# Patient Record
Sex: Male | Born: 1935
Health system: Southern US, Community
[De-identification: ages and names within clinical notes are randomized; demographics above are authoritative.]

## PROBLEM LIST (undated history)

## (undated) DIAGNOSIS — N400 Enlarged prostate without lower urinary tract symptoms: Secondary | ICD-10-CM

## (undated) DIAGNOSIS — E785 Hyperlipidemia, unspecified: Secondary | ICD-10-CM

## (undated) DIAGNOSIS — I10 Essential (primary) hypertension: Secondary | ICD-10-CM

## (undated) DIAGNOSIS — M1990 Unspecified osteoarthritis, unspecified site: Secondary | ICD-10-CM

## (undated) DIAGNOSIS — F0280 Dementia in other diseases classified elsewhere without behavioral disturbance: Secondary | ICD-10-CM

## (undated) DIAGNOSIS — M810 Age-related osteoporosis without current pathological fracture: Secondary | ICD-10-CM

## (undated) DIAGNOSIS — K219 Gastro-esophageal reflux disease without esophagitis: Secondary | ICD-10-CM

## (undated) HISTORY — DX: Hyperlipidemia, unspecified: E78.5

## (undated) HISTORY — DX: Gastro-esophageal reflux disease without esophagitis: K21.9

## (undated) HISTORY — DX: Essential (primary) hypertension: I10

## (undated) HISTORY — DX: Age-related osteoporosis without current pathological fracture: M81.0

---

## 2006-01-07 HISTORY — PX: HERNIA REPAIR: SHX51

## 2011-01-08 HISTORY — PX: GANGLION CYST EXCISION: SHX1691

## 2014-01-14 DIAGNOSIS — M9905 Segmental and somatic dysfunction of pelvic region: Secondary | ICD-10-CM | POA: Diagnosis not present

## 2014-01-14 DIAGNOSIS — M62830 Muscle spasm of back: Secondary | ICD-10-CM | POA: Diagnosis not present

## 2014-01-14 DIAGNOSIS — S39012A Strain of muscle, fascia and tendon of lower back, initial encounter: Secondary | ICD-10-CM | POA: Diagnosis not present

## 2014-01-14 DIAGNOSIS — M47816 Spondylosis without myelopathy or radiculopathy, lumbar region: Secondary | ICD-10-CM | POA: Diagnosis not present

## 2014-01-14 DIAGNOSIS — M5136 Other intervertebral disc degeneration, lumbar region: Secondary | ICD-10-CM | POA: Diagnosis not present

## 2014-01-14 DIAGNOSIS — M9903 Segmental and somatic dysfunction of lumbar region: Secondary | ICD-10-CM | POA: Diagnosis not present

## 2014-01-14 DIAGNOSIS — M9904 Segmental and somatic dysfunction of sacral region: Secondary | ICD-10-CM | POA: Diagnosis not present

## 2014-01-14 DIAGNOSIS — M2578 Osteophyte, vertebrae: Secondary | ICD-10-CM | POA: Diagnosis not present

## 2014-01-17 DIAGNOSIS — M2578 Osteophyte, vertebrae: Secondary | ICD-10-CM | POA: Diagnosis not present

## 2014-01-17 DIAGNOSIS — M9903 Segmental and somatic dysfunction of lumbar region: Secondary | ICD-10-CM | POA: Diagnosis not present

## 2014-01-17 DIAGNOSIS — M62830 Muscle spasm of back: Secondary | ICD-10-CM | POA: Diagnosis not present

## 2014-01-17 DIAGNOSIS — M9905 Segmental and somatic dysfunction of pelvic region: Secondary | ICD-10-CM | POA: Diagnosis not present

## 2014-01-17 DIAGNOSIS — M47816 Spondylosis without myelopathy or radiculopathy, lumbar region: Secondary | ICD-10-CM | POA: Diagnosis not present

## 2014-01-17 DIAGNOSIS — M5136 Other intervertebral disc degeneration, lumbar region: Secondary | ICD-10-CM | POA: Diagnosis not present

## 2014-01-17 DIAGNOSIS — M9904 Segmental and somatic dysfunction of sacral region: Secondary | ICD-10-CM | POA: Diagnosis not present

## 2014-01-17 DIAGNOSIS — S39012A Strain of muscle, fascia and tendon of lower back, initial encounter: Secondary | ICD-10-CM | POA: Diagnosis not present

## 2014-01-18 DIAGNOSIS — M9905 Segmental and somatic dysfunction of pelvic region: Secondary | ICD-10-CM | POA: Diagnosis not present

## 2014-01-18 DIAGNOSIS — S39012A Strain of muscle, fascia and tendon of lower back, initial encounter: Secondary | ICD-10-CM | POA: Diagnosis not present

## 2014-01-18 DIAGNOSIS — M5136 Other intervertebral disc degeneration, lumbar region: Secondary | ICD-10-CM | POA: Diagnosis not present

## 2014-01-18 DIAGNOSIS — M2578 Osteophyte, vertebrae: Secondary | ICD-10-CM | POA: Diagnosis not present

## 2014-01-18 DIAGNOSIS — M9903 Segmental and somatic dysfunction of lumbar region: Secondary | ICD-10-CM | POA: Diagnosis not present

## 2014-01-18 DIAGNOSIS — M62830 Muscle spasm of back: Secondary | ICD-10-CM | POA: Diagnosis not present

## 2014-01-18 DIAGNOSIS — M9904 Segmental and somatic dysfunction of sacral region: Secondary | ICD-10-CM | POA: Diagnosis not present

## 2014-01-18 DIAGNOSIS — M47816 Spondylosis without myelopathy or radiculopathy, lumbar region: Secondary | ICD-10-CM | POA: Diagnosis not present

## 2014-01-20 DIAGNOSIS — S39012A Strain of muscle, fascia and tendon of lower back, initial encounter: Secondary | ICD-10-CM | POA: Diagnosis not present

## 2014-01-20 DIAGNOSIS — M9904 Segmental and somatic dysfunction of sacral region: Secondary | ICD-10-CM | POA: Diagnosis not present

## 2014-01-20 DIAGNOSIS — M5136 Other intervertebral disc degeneration, lumbar region: Secondary | ICD-10-CM | POA: Diagnosis not present

## 2014-01-20 DIAGNOSIS — M9905 Segmental and somatic dysfunction of pelvic region: Secondary | ICD-10-CM | POA: Diagnosis not present

## 2014-01-20 DIAGNOSIS — M2578 Osteophyte, vertebrae: Secondary | ICD-10-CM | POA: Diagnosis not present

## 2014-01-20 DIAGNOSIS — M47816 Spondylosis without myelopathy or radiculopathy, lumbar region: Secondary | ICD-10-CM | POA: Diagnosis not present

## 2014-01-20 DIAGNOSIS — M9903 Segmental and somatic dysfunction of lumbar region: Secondary | ICD-10-CM | POA: Diagnosis not present

## 2014-01-20 DIAGNOSIS — M62830 Muscle spasm of back: Secondary | ICD-10-CM | POA: Diagnosis not present

## 2014-01-21 DIAGNOSIS — D1722 Benign lipomatous neoplasm of skin and subcutaneous tissue of left arm: Secondary | ICD-10-CM | POA: Diagnosis not present

## 2014-01-21 DIAGNOSIS — D179 Benign lipomatous neoplasm, unspecified: Secondary | ICD-10-CM | POA: Diagnosis not present

## 2014-01-21 DIAGNOSIS — R2232 Localized swelling, mass and lump, left upper limb: Secondary | ICD-10-CM | POA: Diagnosis not present

## 2014-01-28 DIAGNOSIS — M5136 Other intervertebral disc degeneration, lumbar region: Secondary | ICD-10-CM | POA: Diagnosis not present

## 2014-01-28 DIAGNOSIS — M47816 Spondylosis without myelopathy or radiculopathy, lumbar region: Secondary | ICD-10-CM | POA: Diagnosis not present

## 2014-01-28 DIAGNOSIS — M9905 Segmental and somatic dysfunction of pelvic region: Secondary | ICD-10-CM | POA: Diagnosis not present

## 2014-01-28 DIAGNOSIS — M9904 Segmental and somatic dysfunction of sacral region: Secondary | ICD-10-CM | POA: Diagnosis not present

## 2014-01-28 DIAGNOSIS — M2578 Osteophyte, vertebrae: Secondary | ICD-10-CM | POA: Diagnosis not present

## 2014-01-28 DIAGNOSIS — M9903 Segmental and somatic dysfunction of lumbar region: Secondary | ICD-10-CM | POA: Diagnosis not present

## 2014-01-28 DIAGNOSIS — M62830 Muscle spasm of back: Secondary | ICD-10-CM | POA: Diagnosis not present

## 2014-01-28 DIAGNOSIS — S39012A Strain of muscle, fascia and tendon of lower back, initial encounter: Secondary | ICD-10-CM | POA: Diagnosis not present

## 2014-03-15 DIAGNOSIS — M1990 Unspecified osteoarthritis, unspecified site: Secondary | ICD-10-CM | POA: Diagnosis not present

## 2014-03-15 DIAGNOSIS — I10 Essential (primary) hypertension: Secondary | ICD-10-CM | POA: Diagnosis not present

## 2014-03-15 DIAGNOSIS — E785 Hyperlipidemia, unspecified: Secondary | ICD-10-CM | POA: Diagnosis not present

## 2014-05-12 DIAGNOSIS — L821 Other seborrheic keratosis: Secondary | ICD-10-CM | POA: Diagnosis not present

## 2014-05-12 DIAGNOSIS — L218 Other seborrheic dermatitis: Secondary | ICD-10-CM | POA: Diagnosis not present

## 2014-05-12 DIAGNOSIS — L820 Inflamed seborrheic keratosis: Secondary | ICD-10-CM | POA: Diagnosis not present

## 2014-05-12 DIAGNOSIS — D1722 Benign lipomatous neoplasm of skin and subcutaneous tissue of left arm: Secondary | ICD-10-CM | POA: Diagnosis not present

## 2014-05-12 DIAGNOSIS — D2372 Other benign neoplasm of skin of left lower limb, including hip: Secondary | ICD-10-CM | POA: Diagnosis not present

## 2014-06-24 DIAGNOSIS — Z79899 Other long term (current) drug therapy: Secondary | ICD-10-CM | POA: Diagnosis not present

## 2014-06-24 DIAGNOSIS — K219 Gastro-esophageal reflux disease without esophagitis: Secondary | ICD-10-CM | POA: Diagnosis not present

## 2014-06-24 DIAGNOSIS — I10 Essential (primary) hypertension: Secondary | ICD-10-CM | POA: Diagnosis not present

## 2014-06-24 DIAGNOSIS — E559 Vitamin D deficiency, unspecified: Secondary | ICD-10-CM | POA: Diagnosis not present

## 2014-06-24 DIAGNOSIS — E785 Hyperlipidemia, unspecified: Secondary | ICD-10-CM | POA: Diagnosis not present

## 2014-06-24 DIAGNOSIS — R635 Abnormal weight gain: Secondary | ICD-10-CM | POA: Diagnosis not present

## 2014-06-24 DIAGNOSIS — M1990 Unspecified osteoarthritis, unspecified site: Secondary | ICD-10-CM | POA: Diagnosis not present

## 2014-07-13 DIAGNOSIS — I10 Essential (primary) hypertension: Secondary | ICD-10-CM | POA: Diagnosis not present

## 2014-07-13 DIAGNOSIS — R634 Abnormal weight loss: Secondary | ICD-10-CM | POA: Diagnosis not present

## 2014-07-13 DIAGNOSIS — M1990 Unspecified osteoarthritis, unspecified site: Secondary | ICD-10-CM | POA: Diagnosis not present

## 2014-07-13 DIAGNOSIS — E785 Hyperlipidemia, unspecified: Secondary | ICD-10-CM | POA: Diagnosis not present

## 2014-07-26 DIAGNOSIS — R490 Dysphonia: Secondary | ICD-10-CM | POA: Diagnosis not present

## 2014-07-26 DIAGNOSIS — J387 Other diseases of larynx: Secondary | ICD-10-CM | POA: Diagnosis not present

## 2014-08-08 DIAGNOSIS — M47816 Spondylosis without myelopathy or radiculopathy, lumbar region: Secondary | ICD-10-CM | POA: Diagnosis not present

## 2014-08-08 DIAGNOSIS — M9904 Segmental and somatic dysfunction of sacral region: Secondary | ICD-10-CM | POA: Diagnosis not present

## 2014-08-08 DIAGNOSIS — M5137 Other intervertebral disc degeneration, lumbosacral region: Secondary | ICD-10-CM | POA: Diagnosis not present

## 2014-08-08 DIAGNOSIS — M9905 Segmental and somatic dysfunction of pelvic region: Secondary | ICD-10-CM | POA: Diagnosis not present

## 2014-08-08 DIAGNOSIS — R293 Abnormal posture: Secondary | ICD-10-CM | POA: Diagnosis not present

## 2014-08-08 DIAGNOSIS — M9903 Segmental and somatic dysfunction of lumbar region: Secondary | ICD-10-CM | POA: Diagnosis not present

## 2014-08-08 DIAGNOSIS — M545 Low back pain: Secondary | ICD-10-CM | POA: Diagnosis not present

## 2014-08-09 DIAGNOSIS — M9903 Segmental and somatic dysfunction of lumbar region: Secondary | ICD-10-CM | POA: Diagnosis not present

## 2014-08-09 DIAGNOSIS — M5137 Other intervertebral disc degeneration, lumbosacral region: Secondary | ICD-10-CM | POA: Diagnosis not present

## 2014-08-09 DIAGNOSIS — M47816 Spondylosis without myelopathy or radiculopathy, lumbar region: Secondary | ICD-10-CM | POA: Diagnosis not present

## 2014-08-09 DIAGNOSIS — M9905 Segmental and somatic dysfunction of pelvic region: Secondary | ICD-10-CM | POA: Diagnosis not present

## 2014-08-09 DIAGNOSIS — M545 Low back pain: Secondary | ICD-10-CM | POA: Diagnosis not present

## 2014-08-09 DIAGNOSIS — R293 Abnormal posture: Secondary | ICD-10-CM | POA: Diagnosis not present

## 2014-08-09 DIAGNOSIS — M9904 Segmental and somatic dysfunction of sacral region: Secondary | ICD-10-CM | POA: Diagnosis not present

## 2014-08-11 DIAGNOSIS — M545 Low back pain: Secondary | ICD-10-CM | POA: Diagnosis not present

## 2014-08-11 DIAGNOSIS — M9903 Segmental and somatic dysfunction of lumbar region: Secondary | ICD-10-CM | POA: Diagnosis not present

## 2014-08-11 DIAGNOSIS — M9904 Segmental and somatic dysfunction of sacral region: Secondary | ICD-10-CM | POA: Diagnosis not present

## 2014-08-11 DIAGNOSIS — M9905 Segmental and somatic dysfunction of pelvic region: Secondary | ICD-10-CM | POA: Diagnosis not present

## 2014-08-11 DIAGNOSIS — M47816 Spondylosis without myelopathy or radiculopathy, lumbar region: Secondary | ICD-10-CM | POA: Diagnosis not present

## 2014-08-11 DIAGNOSIS — M5137 Other intervertebral disc degeneration, lumbosacral region: Secondary | ICD-10-CM | POA: Diagnosis not present

## 2014-08-11 DIAGNOSIS — R293 Abnormal posture: Secondary | ICD-10-CM | POA: Diagnosis not present

## 2014-08-15 DIAGNOSIS — R293 Abnormal posture: Secondary | ICD-10-CM | POA: Diagnosis not present

## 2014-08-15 DIAGNOSIS — M5137 Other intervertebral disc degeneration, lumbosacral region: Secondary | ICD-10-CM | POA: Diagnosis not present

## 2014-08-15 DIAGNOSIS — M9903 Segmental and somatic dysfunction of lumbar region: Secondary | ICD-10-CM | POA: Diagnosis not present

## 2014-08-15 DIAGNOSIS — M9905 Segmental and somatic dysfunction of pelvic region: Secondary | ICD-10-CM | POA: Diagnosis not present

## 2014-08-15 DIAGNOSIS — M9904 Segmental and somatic dysfunction of sacral region: Secondary | ICD-10-CM | POA: Diagnosis not present

## 2014-08-15 DIAGNOSIS — M545 Low back pain: Secondary | ICD-10-CM | POA: Diagnosis not present

## 2014-08-15 DIAGNOSIS — M47816 Spondylosis without myelopathy or radiculopathy, lumbar region: Secondary | ICD-10-CM | POA: Diagnosis not present

## 2014-08-17 DIAGNOSIS — M5137 Other intervertebral disc degeneration, lumbosacral region: Secondary | ICD-10-CM | POA: Diagnosis not present

## 2014-08-17 DIAGNOSIS — M47816 Spondylosis without myelopathy or radiculopathy, lumbar region: Secondary | ICD-10-CM | POA: Diagnosis not present

## 2014-08-17 DIAGNOSIS — M545 Low back pain: Secondary | ICD-10-CM | POA: Diagnosis not present

## 2014-08-17 DIAGNOSIS — M9903 Segmental and somatic dysfunction of lumbar region: Secondary | ICD-10-CM | POA: Diagnosis not present

## 2014-08-17 DIAGNOSIS — M9905 Segmental and somatic dysfunction of pelvic region: Secondary | ICD-10-CM | POA: Diagnosis not present

## 2014-08-17 DIAGNOSIS — R293 Abnormal posture: Secondary | ICD-10-CM | POA: Diagnosis not present

## 2014-08-17 DIAGNOSIS — M9904 Segmental and somatic dysfunction of sacral region: Secondary | ICD-10-CM | POA: Diagnosis not present

## 2014-08-19 DIAGNOSIS — M9903 Segmental and somatic dysfunction of lumbar region: Secondary | ICD-10-CM | POA: Diagnosis not present

## 2014-08-19 DIAGNOSIS — M9905 Segmental and somatic dysfunction of pelvic region: Secondary | ICD-10-CM | POA: Diagnosis not present

## 2014-08-19 DIAGNOSIS — M545 Low back pain: Secondary | ICD-10-CM | POA: Diagnosis not present

## 2014-08-19 DIAGNOSIS — M9904 Segmental and somatic dysfunction of sacral region: Secondary | ICD-10-CM | POA: Diagnosis not present

## 2014-08-19 DIAGNOSIS — M47816 Spondylosis without myelopathy or radiculopathy, lumbar region: Secondary | ICD-10-CM | POA: Diagnosis not present

## 2014-08-19 DIAGNOSIS — M5137 Other intervertebral disc degeneration, lumbosacral region: Secondary | ICD-10-CM | POA: Diagnosis not present

## 2014-08-19 DIAGNOSIS — R293 Abnormal posture: Secondary | ICD-10-CM | POA: Diagnosis not present

## 2014-08-23 DIAGNOSIS — M9905 Segmental and somatic dysfunction of pelvic region: Secondary | ICD-10-CM | POA: Diagnosis not present

## 2014-08-23 DIAGNOSIS — M5137 Other intervertebral disc degeneration, lumbosacral region: Secondary | ICD-10-CM | POA: Diagnosis not present

## 2014-08-23 DIAGNOSIS — M47816 Spondylosis without myelopathy or radiculopathy, lumbar region: Secondary | ICD-10-CM | POA: Diagnosis not present

## 2014-08-23 DIAGNOSIS — M545 Low back pain: Secondary | ICD-10-CM | POA: Diagnosis not present

## 2014-08-23 DIAGNOSIS — R293 Abnormal posture: Secondary | ICD-10-CM | POA: Diagnosis not present

## 2014-08-23 DIAGNOSIS — M9904 Segmental and somatic dysfunction of sacral region: Secondary | ICD-10-CM | POA: Diagnosis not present

## 2014-08-23 DIAGNOSIS — M9903 Segmental and somatic dysfunction of lumbar region: Secondary | ICD-10-CM | POA: Diagnosis not present

## 2014-09-06 DIAGNOSIS — J387 Other diseases of larynx: Secondary | ICD-10-CM | POA: Diagnosis not present

## 2014-09-06 DIAGNOSIS — R490 Dysphonia: Secondary | ICD-10-CM | POA: Diagnosis not present

## 2014-11-08 DIAGNOSIS — H524 Presbyopia: Secondary | ICD-10-CM | POA: Diagnosis not present

## 2014-11-08 DIAGNOSIS — H04123 Dry eye syndrome of bilateral lacrimal glands: Secondary | ICD-10-CM | POA: Diagnosis not present

## 2014-11-08 DIAGNOSIS — Z961 Presence of intraocular lens: Secondary | ICD-10-CM | POA: Diagnosis not present

## 2014-11-08 DIAGNOSIS — H35363 Drusen (degenerative) of macula, bilateral: Secondary | ICD-10-CM | POA: Diagnosis not present

## 2014-11-21 ENCOUNTER — Encounter: Payer: Self-pay | Admitting: Family Medicine

## 2014-11-21 DIAGNOSIS — I10 Essential (primary) hypertension: Secondary | ICD-10-CM | POA: Diagnosis not present

## 2014-11-21 DIAGNOSIS — M1990 Unspecified osteoarthritis, unspecified site: Secondary | ICD-10-CM | POA: Diagnosis not present

## 2014-11-21 DIAGNOSIS — M5136 Other intervertebral disc degeneration, lumbar region: Secondary | ICD-10-CM | POA: Diagnosis not present

## 2014-11-21 DIAGNOSIS — Z79899 Other long term (current) drug therapy: Secondary | ICD-10-CM | POA: Diagnosis not present

## 2014-11-21 DIAGNOSIS — E785 Hyperlipidemia, unspecified: Secondary | ICD-10-CM | POA: Diagnosis not present

## 2014-11-21 DIAGNOSIS — E559 Vitamin D deficiency, unspecified: Secondary | ICD-10-CM | POA: Diagnosis not present

## 2014-11-21 DIAGNOSIS — R635 Abnormal weight gain: Secondary | ICD-10-CM | POA: Diagnosis not present

## 2014-11-21 DIAGNOSIS — M419 Scoliosis, unspecified: Secondary | ICD-10-CM | POA: Diagnosis not present

## 2014-11-21 DIAGNOSIS — R52 Pain, unspecified: Secondary | ICD-10-CM | POA: Diagnosis not present

## 2014-11-21 DIAGNOSIS — Z23 Encounter for immunization: Secondary | ICD-10-CM | POA: Diagnosis not present

## 2014-11-21 DIAGNOSIS — K219 Gastro-esophageal reflux disease without esophagitis: Secondary | ICD-10-CM | POA: Diagnosis not present

## 2014-11-22 LAB — BASIC METABOLIC PANEL
BUN/Creatinine Ratio: 18
BUN: 16
Calcium: 9.1 mg/dL
Carbon Dioxide, Total: 25
Chloride: 103 mmol/L
Creat: 0.91
EGFR (Non-African Amer.): 80
Glucose: 99 (ref 65–99)
Potassium: 4.2 mmol/L
Sodium: 146

## 2014-11-22 LAB — LIPID PANEL
Cholesterol, Total: 183
HDL Cholesterol: 33 — AB (ref 39–?)
LDL Cholesterol: 117 mg/dL — AB (ref 0–99)
Triglycerides: 164 — AB (ref 0–149)
VLDL: 33 mg/dL

## 2014-12-15 DIAGNOSIS — M5431 Sciatica, right side: Secondary | ICD-10-CM | POA: Diagnosis not present

## 2014-12-15 DIAGNOSIS — M47896 Other spondylosis, lumbar region: Secondary | ICD-10-CM | POA: Diagnosis not present

## 2015-02-01 DIAGNOSIS — H524 Presbyopia: Secondary | ICD-10-CM | POA: Diagnosis not present

## 2015-02-01 DIAGNOSIS — H35363 Drusen (degenerative) of macula, bilateral: Secondary | ICD-10-CM | POA: Diagnosis not present

## 2015-02-01 DIAGNOSIS — H52222 Regular astigmatism, left eye: Secondary | ICD-10-CM | POA: Diagnosis not present

## 2015-02-01 DIAGNOSIS — H5203 Hypermetropia, bilateral: Secondary | ICD-10-CM | POA: Diagnosis not present

## 2015-02-15 DIAGNOSIS — M47896 Other spondylosis, lumbar region: Secondary | ICD-10-CM | POA: Diagnosis not present

## 2015-04-11 DIAGNOSIS — K219 Gastro-esophageal reflux disease without esophagitis: Secondary | ICD-10-CM | POA: Diagnosis not present

## 2015-04-11 DIAGNOSIS — R5383 Other fatigue: Secondary | ICD-10-CM | POA: Diagnosis not present

## 2015-04-11 DIAGNOSIS — Z79899 Other long term (current) drug therapy: Secondary | ICD-10-CM | POA: Diagnosis not present

## 2015-04-11 DIAGNOSIS — I10 Essential (primary) hypertension: Secondary | ICD-10-CM | POA: Diagnosis not present

## 2015-04-11 DIAGNOSIS — E785 Hyperlipidemia, unspecified: Secondary | ICD-10-CM | POA: Diagnosis not present

## 2015-04-17 ENCOUNTER — Encounter: Payer: Self-pay | Admitting: Family Medicine

## 2015-04-17 DIAGNOSIS — R635 Abnormal weight gain: Secondary | ICD-10-CM | POA: Diagnosis not present

## 2015-04-17 DIAGNOSIS — E559 Vitamin D deficiency, unspecified: Secondary | ICD-10-CM | POA: Diagnosis not present

## 2015-04-17 DIAGNOSIS — E785 Hyperlipidemia, unspecified: Secondary | ICD-10-CM | POA: Diagnosis not present

## 2015-04-17 DIAGNOSIS — I10 Essential (primary) hypertension: Secondary | ICD-10-CM | POA: Diagnosis not present

## 2015-04-17 DIAGNOSIS — Z79899 Other long term (current) drug therapy: Secondary | ICD-10-CM | POA: Diagnosis not present

## 2015-04-17 DIAGNOSIS — M1990 Unspecified osteoarthritis, unspecified site: Secondary | ICD-10-CM | POA: Diagnosis not present

## 2015-04-17 DIAGNOSIS — K219 Gastro-esophageal reflux disease without esophagitis: Secondary | ICD-10-CM | POA: Diagnosis not present

## 2015-04-18 LAB — HEPATIC FUNCTION PANEL
ALT: 8 (ref 0–44)
AST: 14 U/L (ref 0–40)
Albumin Serum: 3.9 (ref 3.5–4.8)
Alkaline Phosphatase: 62 U/L (ref 39–117)
Bilirubin, Direct: 0.23 mg/dL (ref 0.01–0.4)
Total Bilirubin: 0.9 mg/dL (ref 0.0–1.2)
Total Protein: 6.3 g/dL (ref 6.0–8.5)

## 2015-04-18 LAB — CBC WITH DIFFERENTIAL
BASO%: 1 %
EOS (ABSOLUTE VALUE): 0.1
EOS%: 2 %
Grans (Absolute): 0
HCT: 42 %
Hemoglobin: 14.1
Lymphocytes relative %: 28 % (ref 15–45)
Lymphs(Absolute): 1.5
MCH: 30.3
MCHC: 33.7
MCV: 90
Monocyte %: 8
Monocytes Absolute: 0 /uL
Neutrophils Absolute: 3 /uL
Neutrophils: 61
Platelet: 188
RBC: 4.65
RDW: 14.7
WBC: 5.4

## 2015-04-18 LAB — BASIC METABOLIC PANEL
BUN/Creatinine Ratio: 17
BUN: 18 (ref 8–27)
Calcium: 8.5 mg/dL — AB (ref 8.6–10.2)
Chloride: 102 mmol/L
EGFR (Non-African Amer.): 68
Glucose: 102 — AB (ref 65–99)
Potassium: 4 mmol/L
Sodium: 143

## 2015-04-18 LAB — URINALYSIS
Bilirubin (Urine): NEGATIVE
Blood, UA: NEGATIVE
Glucose, Ur: NEGATIVE
Ketones: NEGATIVE
Leukocyte Esterase: NEGATIVE
Nitrite, UA: NEGATIVE
Protein, Ur: NEGATIVE
Specific Gravity: 1.018
Urobilinogen, UA: NORMAL
pH: 6

## 2015-04-18 LAB — LIPID PANEL
Cholesterol, Total: 151 (ref 100–199)
HDL Cholesterol: 32 — AB (ref 39–?)
LDL Cholesterol (Calc): 96 (ref 0–99)
Triglycerides: 116 (ref 0–149)
VLDL: 23 mg/dL (ref 5–40)

## 2015-04-18 LAB — VITAMIN D, 1,25 + 25-HYDROXY: Vit D, 25-Hydroxy: 47.8 (ref 30–100)

## 2015-05-11 DIAGNOSIS — D234 Other benign neoplasm of skin of scalp and neck: Secondary | ICD-10-CM | POA: Diagnosis not present

## 2015-05-11 DIAGNOSIS — L820 Inflamed seborrheic keratosis: Secondary | ICD-10-CM | POA: Diagnosis not present

## 2015-05-11 DIAGNOSIS — M79642 Pain in left hand: Secondary | ICD-10-CM | POA: Diagnosis not present

## 2015-05-11 DIAGNOSIS — L218 Other seborrheic dermatitis: Secondary | ICD-10-CM | POA: Diagnosis not present

## 2015-05-11 DIAGNOSIS — L720 Epidermal cyst: Secondary | ICD-10-CM | POA: Diagnosis not present

## 2015-05-11 DIAGNOSIS — D2372 Other benign neoplasm of skin of left lower limb, including hip: Secondary | ICD-10-CM | POA: Diagnosis not present

## 2015-05-11 DIAGNOSIS — D1722 Benign lipomatous neoplasm of skin and subcutaneous tissue of left arm: Secondary | ICD-10-CM | POA: Diagnosis not present

## 2015-05-11 DIAGNOSIS — M720 Palmar fascial fibromatosis [Dupuytren]: Secondary | ICD-10-CM | POA: Diagnosis not present

## 2015-05-11 DIAGNOSIS — L538 Other specified erythematous conditions: Secondary | ICD-10-CM | POA: Diagnosis not present

## 2015-05-11 DIAGNOSIS — D485 Neoplasm of uncertain behavior of skin: Secondary | ICD-10-CM | POA: Diagnosis not present

## 2015-05-23 DIAGNOSIS — M720 Palmar fascial fibromatosis [Dupuytren]: Secondary | ICD-10-CM | POA: Diagnosis not present

## 2015-05-25 DIAGNOSIS — M720 Palmar fascial fibromatosis [Dupuytren]: Secondary | ICD-10-CM | POA: Diagnosis not present

## 2015-05-28 DIAGNOSIS — M720 Palmar fascial fibromatosis [Dupuytren]: Secondary | ICD-10-CM | POA: Insufficient documentation

## 2015-10-02 DIAGNOSIS — I6789 Other cerebrovascular disease: Secondary | ICD-10-CM | POA: Diagnosis not present

## 2015-10-02 DIAGNOSIS — K219 Gastro-esophageal reflux disease without esophagitis: Secondary | ICD-10-CM | POA: Diagnosis not present

## 2015-10-02 DIAGNOSIS — I10 Essential (primary) hypertension: Secondary | ICD-10-CM | POA: Diagnosis not present

## 2015-10-02 DIAGNOSIS — E785 Hyperlipidemia, unspecified: Secondary | ICD-10-CM | POA: Diagnosis not present

## 2015-11-11 DIAGNOSIS — Z23 Encounter for immunization: Secondary | ICD-10-CM | POA: Diagnosis not present

## 2016-02-26 ENCOUNTER — Telehealth: Payer: Self-pay

## 2016-02-26 DIAGNOSIS — I10 Essential (primary) hypertension: Secondary | ICD-10-CM

## 2016-02-26 MED ORDER — ENALAPRIL MALEATE 10 MG PO TABS: 10.0000 mg | ORAL_TABLET | Freq: Two times a day (BID) | ORAL | 2 refills | Status: DC

## 2016-02-26 NOTE — Telephone Encounter (Signed)
Patient scheduled to establish care within 1 week on 03/05/16, requested refill of Enalapril 10mg  BID, sent refill to [facility] in [place] as requested, with +2 refills and will discuss meds further at apt.  [name], [facility] 02/26/2016, 2:21 PM

## 2016-02-26 NOTE — Telephone Encounter (Signed)
Patient has just moved from [facility] and has a new patient appointment on Tuesday 03/05/16.  However, he is out of Enalapril 10mg  bid.  Patient would like to know if you would give him enough meds until he gets to his appointment next week.  He would like to use [facility] in [place].  Please call patient.

## 2016-03-05 ENCOUNTER — Encounter: Payer: Self-pay | Admitting: Family Medicine

## 2016-03-05 ENCOUNTER — Ambulatory Visit (INDEPENDENT_AMBULATORY_CARE_PROVIDER_SITE_OTHER): Payer: Medicare Other | Admitting: Family Medicine

## 2016-03-05 VITALS — BP 160/80 | HR 59 | Temp 97.8°F | Resp 16 | Ht 72.0 in | Wt 229.0 lb

## 2016-03-05 DIAGNOSIS — K219 Gastro-esophageal reflux disease without esophagitis: Secondary | ICD-10-CM | POA: Diagnosis not present

## 2016-03-05 DIAGNOSIS — Z7689 Persons encountering health services in other specified circumstances: Secondary | ICD-10-CM

## 2016-03-05 DIAGNOSIS — E782 Mixed hyperlipidemia: Secondary | ICD-10-CM

## 2016-03-05 DIAGNOSIS — Z23 Encounter for immunization: Secondary | ICD-10-CM

## 2016-03-05 DIAGNOSIS — R600 Localized edema: Secondary | ICD-10-CM | POA: Diagnosis not present

## 2016-03-05 DIAGNOSIS — M810 Age-related osteoporosis without current pathological fracture: Secondary | ICD-10-CM

## 2016-03-05 DIAGNOSIS — I10 Essential (primary) hypertension: Secondary | ICD-10-CM | POA: Insufficient documentation

## 2016-03-05 DIAGNOSIS — E785 Hyperlipidemia, unspecified: Secondary | ICD-10-CM | POA: Insufficient documentation

## 2016-03-05 MED ORDER — OMEPRAZOLE 20 MG PO CPDR: 20.0000 mg | DELAYED_RELEASE_CAPSULE | Freq: Every day | ORAL | 3 refills | Status: DC

## 2016-03-05 MED ORDER — ENALAPRIL MALEATE 10 MG PO TABS: 10.0000 mg | ORAL_TABLET | Freq: Every day | ORAL | 3 refills | Status: DC

## 2016-03-05 MED ORDER — FUROSEMIDE 20 MG PO TABS: 20.0000 mg | ORAL_TABLET | Freq: Every day | ORAL | 2 refills | Status: DC | PRN

## 2016-03-05 NOTE — Assessment & Plan Note (Signed)
Stable chronic GERD, controlled on PPI - No prior known PUD or esophagitis, unsure if prior EGD - No regular NSAID use, some tylenol only  Plan: 1. Refilled chronic Omeprazole 20mg  daily, counseled on risk with osteoporosis 2. Diet modifications reduce GERD 3. Follow-up as needed consider referral GI and EGD in future if worsening

## 2016-03-05 NOTE — Patient Instructions (Signed)
Thank you for coming in to clinic today.  Pleasure to meet you.  1. We will request all of your records from previous doctor and review these first  2. Today received TDap vaccine, good for 10 years  3. No blood work yet, will re-schedule this in future  4. Will order DEXA scan for osteoporosis evaluation  5. BP elevated - Refilled medication - Continue to take Enalapril 10mg  daily - You may continue Furosemide 20mg  ONLY AS NEEDED NOW for swelling, try not to take this everyday as it is not best for blood pressure and may harm kidneys - Try to stay hydrated - For swelling can use Compression with stockings, and Elevation - if significant swelling, lift leg above heart level (toes above your nose) to help reduce swelling, most helpful at night after day of being on your feet  Please schedule a follow-up appointment with Dr. Parks Ranger in 3 months for BP Follow-up  If you have any other questions or concerns, please feel free to call the clinic or send a message through [place]. You may also schedule an earlier appointment if necessary.  [name], DO [place]

## 2016-03-05 NOTE — Assessment & Plan Note (Signed)
Not adequately controlled HTN, BP today mild elevated (did not take meds today) No known complications   Plan:  1. Refilled Enalapril 10mg  daily #90 +3 2. Refilled Furosemide 20mg  - changed to daily PRN only, advised to limit using this regularly, as seems to only use for ankle swelling without other significant etiology for swelling 3. Start checking BP at home, write readings, handout given, bring to next visit 4. Request outside PCP records for labs and other info on prior HTN 5. Encouraged regular exercise 6. Follow-up 3 months - consider adding HCTZ low dose, instead of regular Lasix, check chemistry if needed, follow-up baseline Cr

## 2016-03-05 NOTE — Progress Notes (Addendum)
Subjective:    Patient ID: Randy [name], male    DOB: 02-Mar-1934, 82 y.o.   MRN: [id number]  Randy [name] is a 82 y.o. male presenting on 03/05/2016 for Establish Care (pt moved recently)  Previously established with PCP Dr Zenon Mayo ([facility], in [place]), patient moved with wife from [place] here to [place] recently to be closer to children and grandchildren.  HPI   CHRONIC HTN / Lower Extremity Edema Reports chronic problem for many years, has been controlled in past. Does not check BP at home, wife has cuff to check, unsure recent readings. Current Meds - Enalapril 10mg  (refilled recently prior to this visit), Furosemide 20mg  daily (for ankle swelling, for years) Reports good compliance, but did not take meds today. Tolerating well, w/o complaints. Lifestyle: - No regular exercise or particular diet plan - Will get worsening LE swelling if misses Furosemide - Has compression stockings uses occasionally Denies CP, dyspnea, HA, dizziness / lightheadedness  HYPERLIPIDEMIA: - Reports no concerns. Last lipid panel within past 1 year prior PCP, does not recall results but states cholesterol was improved on the fish oil - Previously on several statins in past, with intolerance myalgias, remembers taking lipitor unsure of others - Still taking CoQ10 with improvement in muscle aches and cramps - Taking Fish Oil 1200mg  4 times daily, with improvement - No regular exercise. Weight stable by report with some mild wt loss actually  GERD - Chronic problem for many years, has been stable on PPI omeprazole  - Prior history of EGD in past - Admits some intentional wt loss about 10 lbs over past few months with recent move - Denies any dark stools, rectal bleeding or blood in stool, early satiety, nausea, vomiting, abdominal pain  OSTEOPOROSIS - Chronic problem by history, unsure of last DEXA scan, no prior fractures reported - Continues to take Calcium and Vitamin  D3 2,000 iu daily  Health Maintenance: - Prior colonoscopy possibly >20 years ago, reported normal, awaiting records - Does not recall ever having PNA vaccine, will check records - Unsure of prior DEXA history, thinks he may have had one, not recently - Due for TDap, will get today - No known history of prostate cancer  Depression screen [facility] 2/9 03/05/2016  Decreased Interest 0  Down, Depressed, Hopeless 0  PHQ - 2 Score 0    Past Medical History:  Diagnosis Date  . GERD (gastroesophageal reflux disease)   . Hyperlipidemia   . Hypertension   . Osteoporosis    Past Surgical History:  Procedure Laterality Date  . GANGLION CYST EXCISION Left 2013   Left upper arm  . HERNIA REPAIR N/A 2008   MIdline, ventral acquired hernia repair   Social History   Social History  . Marital status: Married    Spouse name: N/A  . Number of children: N/A  . Years of education: College   Occupational History  . Retired Clinical biochemist    Social History Main Topics  . Smoking status: Former Smoker    Packs/day: 1.00    Years: 12.00    Types: Cigarettes    Quit date: 1968  . Smokeless tobacco: Former Systems developer  . Alcohol use No  . Drug use: No  . Sexual activity: Not on file   Other Topics Concern  . Not on file   Social History Narrative  . No narrative on file   History reviewed. No pertinent family history. No current outpatient prescriptions on file prior to  visit.   No current facility-administered medications on file prior to visit.     Review of Systems  Constitutional: Negative for activity change, appetite change, chills, diaphoresis, fatigue and fever.  HENT: Negative for congestion and hearing loss.   Eyes: Negative for visual disturbance.  Respiratory: Negative for cough, chest tightness, shortness of breath and wheezing.   Cardiovascular: Positive for leg swelling (bilateral ankles, chronic). Negative for chest pain and palpitations.  Gastrointestinal: Negative for  abdominal pain, blood in stool, constipation, diarrhea, nausea and vomiting.       Negative dark stools  Endocrine: Negative for polyuria.  Genitourinary: Negative for difficulty urinating.  Musculoskeletal: Positive for arthralgias (hand stiffness, aching). Negative for back pain, joint swelling and neck pain.  Skin: Negative for rash.  Allergic/Immunologic: Negative for environmental allergies.  Neurological: Negative for dizziness, weakness, light-headedness, numbness and headaches.  Hematological: Negative for adenopathy.  Psychiatric/Behavioral: Negative for behavioral problems, dysphoric mood and sleep disturbance.   Per HPI unless specifically indicated above     Objective:    BP (!) 160/80 (BP Location: Right Arm, Cuff Size: Normal)   Pulse (!) 59   Temp 97.8 F (36.6 C) (Oral)   Resp 16   Ht 6' (1.829 m)   Wt 229 lb (103.9 kg)   BMI 31.06 kg/m   Wt Readings from Last 3 Encounters:  03/05/16 229 lb (103.9 kg)    Physical Exam  Constitutional: He appears well-developed and well-nourished. No distress.  Well-appearing 82 yr male, comfortable, cooperative  HENT:  Head: Normocephalic and atraumatic.  Mouth/Throat: Oropharynx is clear and moist.  Eyes: Conjunctivae and EOM are normal. Pupils are equal, round, and reactive to light.  Neck: Normal range of motion. Neck supple. No thyromegaly present.  No carotid bruits  Cardiovascular: Normal rate, regular rhythm, normal heart sounds and intact distal pulses.   No murmur heard. Pulmonary/Chest: Effort normal and breath sounds normal. No respiratory distress. He has no wheezes. He has no rales.  Musculoskeletal: Normal range of motion. He exhibits edema (bilateral lower ankle limited non pitting mild edema). He exhibits no tenderness.  Lymphadenopathy:    He has no cervical adenopathy.  Neurological: He is alert.  Distal sensation to light touch intact  Skin: Skin is warm and dry. No rash noted. He is not diaphoretic. No  erythema.  Lower extremities / calves non tender, no erythema, no asymmetry  Psychiatric: He has a normal mood and affect. His behavior is normal.  Well groomed, good eye contact, normal speech and thoughts  Nursing note and vitals reviewed.  No results found for this or any previous visit.    Assessment & Plan:   Problem List Items Addressed This Visit    Osteoporosis    Prior history of osteoporosis No prior fracture No recent DEXA scan, last several years ago out of town, awaiting records No therapy  Plan: 1. Ordered DEXA scan, patient to schedule at [facility], follow-up results 2. Continue Ca/Vit D supplement daily      Relevant Medications   calcium citrate-vitamin D (CITRACAL+D) 315-200 MG-UNIT tablet   Other Relevant Orders   DG Bone Density   Hypertension    Not adequately controlled HTN, BP today mild elevated (did not take meds today) No known complications   Plan:  1. Refilled Enalapril 10mg  daily #90 +3 2. Refilled Furosemide 20mg  - changed to daily PRN only, advised to limit using this regularly, as seems to only use for ankle swelling without other significant etiology for  swelling 3. Start checking BP at home, write readings, handout given, bring to next visit 4. Request outside PCP records for labs and other info on prior HTN 5. Encouraged regular exercise 6. Follow-up 3 months - consider adding HCTZ low dose, instead of regular Lasix, check chemistry if needed, follow-up baseline Cr      Relevant Medications   furosemide (LASIX) 20 MG tablet   enalapril (VASOTEC) 10 MG tablet   Hyperlipidemia    Prior history of HLD, do not have outside records or recent labs, done reportedly within past 1 year Intolerance to statin therapy (multiple statins tried, including atorvastatin), improved on CoQ10  Plan: 1. Request records, lab results lipids and prior statins tried 2. Discussion on if needed due to ASCVD risk will consider re-attempt statin trial with low dose  likely rosuvastatin maybe even half dose 3 times weekly intermittently only for some mild benefit still 3. Follow-up 3 months, consider future fasting lipids based on last lab      Relevant Medications   furosemide (LASIX) 20 MG tablet   enalapril (VASOTEC) 10 MG tablet   GERD (gastroesophageal reflux disease)    Stable chronic GERD, controlled on PPI - No prior known PUD or esophagitis, unsure if prior EGD - No regular NSAID use, some tylenol only  Plan: 1. Refilled chronic Omeprazole 20mg  daily, counseled on risk with osteoporosis 2. Diet modifications reduce GERD 3. Follow-up as needed consider referral GI and EGD in future if worsening      Relevant Medications   omeprazole (PRILOSEC) 20 MG capsule   Bilateral lower extremity edema    Stable chronic mild bilateral lower extremity ankle edema, non pitting No prior confirmed etiology, suspected chronic venous stasis given age and chronic course without worsening, no known history of heart disease or CHF, unsure if prior ECHO. No recent labs to check Cr or kidney function / LFTs.  Plan: 1. Advised to only use Furosemide 20mg  daily PRN - refilled, likely switch from lasix daily to HCTZ in future if need improved BP control 2. Requesting outside PCP records, check labs, in future if need will check CMET, future consider ECHO if worsening swelling      Relevant Medications   furosemide (LASIX) 20 MG tablet    Other Visit Diagnoses    Encounter to establish care with new doctor    -  Primary   Need for Tdap vaccination       Relevant Orders   Tdap vaccine greater than or equal to 7yo IM (Completed)      Meds ordered this encounter  Medications  .       .       . Coenzyme Q10 (CO Q 10) 100 MG CAPS    Sig: Take by mouth.  . calcium citrate-vitamin D (CITRACAL+D) 315-200 MG-UNIT tablet    Sig: Take 1 tablet by mouth daily.  . Omega-3 Fatty Acids (FISH OIL) 1200 MG CAPS    Sig: Take by mouth.  [name] omeprazole (PRILOSEC) 20 MG  capsule    Sig: Take 1 capsule (20 mg total) by mouth daily.    Dispense:  90 capsule    Refill:  3  . furosemide (LASIX) 20 MG tablet    Sig: Take 1 tablet (20 mg total) by mouth daily as needed for edema.    Dispense:  30 tablet    Refill:  2  . enalapril (VASOTEC) 10 MG tablet    Sig: Take 1 tablet (10 mg total)  by mouth daily.    Dispense:  90 tablet    Refill:  3      Follow up plan: Return in about 3 months (around 06/02/2016) for blood pressure.  [name], [facility] 03/05/2016, 11:29 AM

## 2016-03-05 NOTE — Assessment & Plan Note (Signed)
Stable chronic mild bilateral lower extremity ankle edema, non pitting No prior confirmed etiology, suspected chronic venous stasis given age and chronic course without worsening, no known history of heart disease or CHF, unsure if prior ECHO. No recent labs to check Cr or kidney function / LFTs.  Plan: 1. Advised to only use Furosemide 20mg  daily PRN - refilled, likely switch from lasix daily to HCTZ in future if need improved BP control 2. Requesting outside PCP records, check labs, in future if need will check CMET, future consider ECHO if worsening swelling

## 2016-03-05 NOTE — Assessment & Plan Note (Signed)
Prior history of HLD, do not have outside records or recent labs, done reportedly within past 1 year Intolerance to statin therapy (multiple statins tried, including atorvastatin), improved on CoQ10  Plan: 1. Request records, lab results lipids and prior statins tried 2. Discussion on if needed due to ASCVD risk will consider re-attempt statin trial with low dose likely rosuvastatin maybe even half dose 3 times weekly intermittently only for some mild benefit still 3. Follow-up 3 months, consider future fasting lipids based on last lab

## 2016-03-05 NOTE — Assessment & Plan Note (Signed)
Prior history of osteoporosis No prior fracture No recent DEXA scan, last several years ago out of town, awaiting records No therapy  Plan: 1. Ordered DEXA scan, patient to schedule at [facility], follow-up results 2. Continue Ca/Vit D supplement daily

## 2016-03-06 ENCOUNTER — Encounter: Payer: Self-pay | Admitting: Family Medicine

## 2016-03-06 ENCOUNTER — Other Ambulatory Visit: Payer: Self-pay | Admitting: Family Medicine

## 2016-03-06 DIAGNOSIS — R7309 Other abnormal glucose: Secondary | ICD-10-CM

## 2016-03-06 DIAGNOSIS — I10 Essential (primary) hypertension: Secondary | ICD-10-CM

## 2016-03-06 DIAGNOSIS — N400 Enlarged prostate without lower urinary tract symptoms: Secondary | ICD-10-CM | POA: Insufficient documentation

## 2016-03-06 DIAGNOSIS — M47816 Spondylosis without myelopathy or radiculopathy, lumbar region: Secondary | ICD-10-CM | POA: Insufficient documentation

## 2016-03-06 DIAGNOSIS — E782 Mixed hyperlipidemia: Secondary | ICD-10-CM

## 2016-03-06 DIAGNOSIS — M159 Polyosteoarthritis, unspecified: Secondary | ICD-10-CM | POA: Insufficient documentation

## 2016-03-06 DIAGNOSIS — M810 Age-related osteoporosis without current pathological fracture: Secondary | ICD-10-CM

## 2016-05-23 ENCOUNTER — Other Ambulatory Visit: Payer: Medicare Other

## 2016-05-27 ENCOUNTER — Other Ambulatory Visit: Payer: Medicare Other

## 2016-05-27 DIAGNOSIS — I10 Essential (primary) hypertension: Secondary | ICD-10-CM

## 2016-05-27 DIAGNOSIS — R7309 Other abnormal glucose: Secondary | ICD-10-CM | POA: Diagnosis not present

## 2016-05-27 DIAGNOSIS — M810 Age-related osteoporosis without current pathological fracture: Secondary | ICD-10-CM

## 2016-05-27 DIAGNOSIS — E782 Mixed hyperlipidemia: Secondary | ICD-10-CM | POA: Diagnosis not present

## 2016-05-28 LAB — COMPLETE METABOLIC PANEL WITH GFR
ALT: 7 U/L — ABNORMAL LOW (ref 9–46)
AST: 12 U/L (ref 10–35)
Albumin: 3.9 g/dL (ref 3.6–5.1)
Alkaline Phosphatase: 60 U/L (ref 40–115)
BUN: 16 mg/dL (ref 7–25)
CO2: 27 mmol/L (ref 20–31)
Calcium: 8.8 mg/dL (ref 8.6–10.3)
Chloride: 105 mmol/L (ref 98–110)
Creat: 0.97 mg/dL (ref 0.70–1.11)
GFR, Est African American: 85 mL/min (ref >=60)
GFR, Est Non African American: 73 mL/min (ref >=60)
Glucose, Bld: 96 mg/dL (ref 65–99)
Potassium: 4.2 mmol/L (ref 3.5–5.3)
Sodium: 143 mmol/L (ref 135–146)
Total Bilirubin: 0.7 mg/dL (ref 0.2–1.2)
Total Protein: 6.3 g/dL (ref 6.1–8.1)

## 2016-05-28 LAB — LIPID PANEL
Cholesterol: 198 mg/dL (ref <200)
HDL: 32 mg/dL — ABNORMAL LOW (ref >40)
LDL Cholesterol: 139 mg/dL — ABNORMAL HIGH (ref <100)
Total CHOL/HDL Ratio: 6.2 Ratio — ABNORMAL HIGH (ref <5.0)
Triglycerides: 136 mg/dL (ref <150)
VLDL: 27 mg/dL (ref <30)

## 2016-05-28 LAB — HEMOGLOBIN A1C
Hgb A1c MFr Bld: 5.4 % (ref <5.7)
Mean Plasma Glucose: 108 mg/dL

## 2016-05-28 LAB — VITAMIN D 25 HYDROXY (VIT D DEFICIENCY, FRACTURES): Vit D, 25-Hydroxy: 51 ng/mL (ref 30–100)

## 2016-05-30 ENCOUNTER — Encounter: Payer: Self-pay | Admitting: Nurse Practitioner

## 2016-05-30 ENCOUNTER — Ambulatory Visit (INDEPENDENT_AMBULATORY_CARE_PROVIDER_SITE_OTHER): Payer: Medicare Other | Admitting: Nurse Practitioner

## 2016-05-30 VITALS — BP 137/73 | HR 63 | Ht 72.0 in | Wt 224.2 lb

## 2016-05-30 DIAGNOSIS — R310 Gross hematuria: Secondary | ICD-10-CM | POA: Diagnosis not present

## 2016-05-30 DIAGNOSIS — N390 Urinary tract infection, site not specified: Secondary | ICD-10-CM

## 2016-05-30 DIAGNOSIS — R319 Hematuria, unspecified: Secondary | ICD-10-CM

## 2016-05-30 LAB — POCT URINALYSIS DIPSTICK
Bilirubin, UA: NEGATIVE
Glucose, UA: NEGATIVE
Ketones, UA: NEGATIVE
Nitrite, UA: POSITIVE
Protein, UA: NEGATIVE
Spec Grav, UA: 1.025 (ref 1.010–1.025)
Urobilinogen, UA: 0.2 E.U./dL
pH, UA: 5 (ref 5.0–8.0)

## 2016-05-30 MED ORDER — NITROFURANTOIN MONOHYD MACRO 100 MG PO CAPS: 100.0000 mg | ORAL_CAPSULE | Freq: Two times a day (BID) | ORAL | 0 refills | Status: DC

## 2016-05-30 NOTE — Patient Instructions (Addendum)
Randy [name], Thank you for coming in to clinic today.  1. You likely have a urinary tract infection, but with blood in your urine we are going to go ahead and send you to urology for further evaluation.  - You will go to the urology office at:  [facility] [address] [place] Phone: [phone]  - We are also sending a urine culture to confirm your possible infection: Take Macrobid 100 mg twice daily (about every 12 hours) for 7 days.  Call clinic if you have any worsening of your urinary symptoms or notice more blood or clots in your urine.  If you are unable to pee in 12-24 hours, call clinic for emergent urology referral or go to the emergency room.   Please schedule a follow-up appointment with [name], AGNP to Return 5-7 days if symptoms worsen or fail to improve and for your regularly scheduled appointment.  If you have any other questions or concerns, please feel free to call the clinic or send a message through [place]. You may also schedule an earlier appointment if necessary.  [name], DNP, AGNP-BC Adult Gerontology Nurse Practitioner [facility], [facility]    Hematuria, Adult Hematuria is blood in your urine. It can be caused by a bladder infection, kidney infection, prostate infection, kidney stone, or cancer of your urinary tract. Infections can usually be treated with medicine, and a kidney stone usually will pass through your urine. If neither of these is the cause of your hematuria, further workup to find out the reason may be needed. It is very important that you tell your health care provider about any blood you see in your urine, even if the blood stops without treatment or happens without causing pain. Blood in your urine that happens and then stops and then happens again can be a symptom of a very serious condition. Also, pain is not a symptom in the initial stages of many urinary  cancers. Follow these instructions at home:  Drink lots of fluid, 3-4 quarts a day. If you have been diagnosed with an infection, cranberry juice is especially recommended, in addition to large amounts of water.  Avoid caffeine, tea, and carbonated beverages because they tend to irritate the bladder.  Avoid alcohol because it may irritate the prostate.  Take all medicines as directed by your health care provider.  If you were prescribed an antibiotic medicine, finish it all even if you start to feel better.  If you have been diagnosed with a kidney stone, follow your health care provider's instructions regarding straining your urine to catch the stone.  Empty your bladder often. Avoid holding urine for long periods of time.  After a bowel movement, women should cleanse front to back. Use each tissue only once.  Empty your bladder before and after sexual intercourse if you are a male. Contact a health care provider if:  You develop back pain.  You have a fever.  You have a feeling of sickness in your stomach (nausea) or vomiting.  Your symptoms are not better in 3 days. Return sooner if you are getting worse. Get help right away if:  You develop severe vomiting and are unable to keep the medicine down.  You develop severe back or abdominal pain despite taking your medicines.  You begin passing a large amount of blood or clots in your urine.  You feel extremely weak or faint, or you pass out. This information is not intended  to replace advice given to you by your health care provider. Make sure you discuss any questions you have with your health care provider. Document Released: 12/24/2004 Document Revised: 06/01/2015 Document Reviewed: 08/24/2012 Elsevier Interactive Patient Education  2017 [employer].

## 2016-05-30 NOTE — Progress Notes (Signed)
I have reviewed this encounter including the documentation in this note and/or discussed this patient with the provider, [name], AGPCNP-BC. I am certifying that I agree with the content of this note as supervising physician.  [name], [facility] 05/30/2016, 5:25 PM

## 2016-05-30 NOTE — Progress Notes (Signed)
Subjective:    Patient ID: Randy [name], male    DOB: Mar 05, 1934, 82 y.o.   MRN: [id number]  Randy [name] is a 82 y.o. male presenting on 05/30/2016 for Hematuria (gross hematuria, dysuria)   HPI Blood in urine today  Onset with first void this am.  Every urination since has had blood.   Bright red blood, that pt states is a little cloudy not clear.  No clots and no blood yesterday.  New urinary symptoms include only dysuria - w/ sample in clinic had slight burning.  He also endorses urgency without change in last day.  He has had urgency approx 1 year and uses pads to prevent urge incontinence.  Denies frequency, difficulty with weak stream/trouble starting or stopping stream, flank pain or new back pain, fever, chills, or sweats.  He has not been sexually active and denies any penile discharge or genital pain.   Social History  Substance Use Topics  . Smoking status: Former Smoker    Packs/day: 1.00    Years: 12.00    Types: Cigarettes    Quit date: 1968  . Smokeless tobacco: Former Systems developer  . Alcohol use No    Review of Systems Per HPI unless specifically indicated above     Objective:    BP 137/73   Pulse 63   Ht 6' (1.829 m)   Wt 224 lb 3.2 oz (101.7 kg)   BMI 30.41 kg/m    Wt Readings from Last 3 Encounters:  05/30/16 224 lb 3.2 oz (101.7 kg)  03/05/16 229 lb (103.9 kg)    Physical Exam  Constitutional: He is oriented to person, place, and time. He appears well-developed and well-nourished. No distress.  HENT:  Head: Normocephalic and atraumatic.  Cardiovascular: Normal rate, regular rhythm and normal heart sounds.   +1 pitting pedal edema with sock-line edema  Pulmonary/Chest: Effort normal and breath sounds normal. No respiratory distress.  Abdominal: Soft. Bowel sounds are normal. He exhibits no distension and no mass. There is no tenderness. There is no rebound and no guarding.  Neurological: He is alert and oriented to person, place, and time.  Skin: Skin is  warm and dry.  Psychiatric: He has a normal mood and affect. His behavior is normal. Judgment and thought content normal.  Vitals reviewed.   Results for orders placed or performed in visit on 05/30/16  POCT Urinalysis Dipstick  Result Value Ref Range   Color, UA Red    Clarity, UA cloudy    Glucose, UA N    Bilirubin, UA N    Ketones, UA N    Spec Grav, UA 1.025 1.010 - 1.025   Blood, UA mod    pH, UA 5.0 5.0 - 8.0   Protein, UA neg    Urobilinogen, UA 0.2 0.2 or 1.0 E.U./dL   Nitrite, UA pos    Leukocytes, UA Large (3+) (A) Negative      Assessment & Plan:   Problem List Items Addressed This Visit    None    Visit Diagnoses    Hematuria, gross    -  Primary Pt with acute onset gross hematuria today with only positive symptom of minimal burning on urination.  No blood noticed yesterday.  Probable UTI with positive dipstick UA (leukocytes and nitrites).  Plan: 1. POCT UA dipstick today. 2. Send urine for culture and sensitivity to confirm infection and antibiotic sensitivity. 3. Urology referral for further evaluation of bleeding source.   Relevant Orders  POCT Urinalysis Dipstick (Completed)   Ambulatory referral to Urology   Urine Culture   Urinary tract infection with hematuria, site unspecified    Probable UTI noted with POCT urine dipstick.   Plan: 1. Macrobid 100 mg bid for 7 days. 2. Await urine culture to confirm antibiotic efficacy.  Call pt to change or stop if not effective or UTI not confirmed, respectively.   Relevant Medications   nitrofurantoin, macrocrystal-monohydrate, (MACROBID) 100 MG capsule      Meds ordered this encounter  Medications  . nitrofurantoin, macrocrystal-monohydrate, (MACROBID) 100 MG capsule    Sig: Take 1 capsule (100 mg total) by mouth 2 (two) times daily.    Dispense:  14 capsule    Refill:  0    Order Specific Question:   Supervising Provider    Answer:   [name] [[id number]]      Follow up plan: Return  5-7 days if symptoms worsen or fail to improve and for your regularly scheduled appointment.   [name], DNP, AGPCNP-BC Adult Gerontology Primary Care Nurse Practitioner [facility] 05/30/2016, 1:24 PM

## 2016-06-05 ENCOUNTER — Encounter: Payer: Self-pay | Admitting: Family Medicine

## 2016-06-05 ENCOUNTER — Ambulatory Visit (INDEPENDENT_AMBULATORY_CARE_PROVIDER_SITE_OTHER): Payer: Medicare Other | Admitting: Family Medicine

## 2016-06-05 VITALS — BP 124/80 | HR 57 | Temp 98.2°F | Resp 16 | Ht 72.0 in | Wt 227.0 lb

## 2016-06-05 DIAGNOSIS — E782 Mixed hyperlipidemia: Secondary | ICD-10-CM

## 2016-06-05 DIAGNOSIS — R600 Localized edema: Secondary | ICD-10-CM

## 2016-06-05 DIAGNOSIS — I10 Essential (primary) hypertension: Secondary | ICD-10-CM

## 2016-06-05 NOTE — Progress Notes (Signed)
Subjective:    Patient ID: Randy [name], male    DOB: 03-Oct-1934, 82 y.o.   MRN: [id number]  Randy [name] is a 82 y.o. male presenting on 06/05/2016 for Hypertension  HPI   FOLLOW-UP GROSS HEMATURIA - Last seen by [name], AGPCNP-BC on 05/30/16, states his symptoms have now resolved within 24 hours of taking the Macrobid antibiotic, has since finished the antibiotic - Denies any further dysuria, hematuria (gross), nausea, vomiting, abdominal or pelvic pain  CHRONIC HTN / Lower Extremity Edema - Last visit with me 03/05/16 for initial visit to establish care, his BP meds were not changed last visit, given refills. - Today he has mild elevated BP on initial check. He has been checking BP at home regularly with ranging SBP 120-140/60-70 - Improved elevation of legs to reduce swelling, and elevated bed as well. Has compression stockings uses occasionally Current Meds - Enalapril 10mg  - He still has Furosemide 20mg  daily, only took 1 pill in past 3 months for lower leg swelling, he admits to polyuria if taking this med, and has not needed Reports good compliance, took BP med today. Tolerating well, w/o complaints. Lifestyle: - No regular exercise or particular diet plan Denies CP, dyspnea, HA, dizziness / lightheadedness  PMH HLD - Has not started taking ASA 81mg  daily for ASCVD risk reduction, he forgot this but will start this soon. Previously discussed intolerant of other statins and Zetia in past.  Health Maintenance: - Has not scheduled DEXA since last visit  Depression screen [facility] 2/9 03/05/2016  Decreased Interest 0  Down, Depressed, Hopeless 0  PHQ - 2 Score 0    Past Medical History:  Diagnosis Date  . GERD (gastroesophageal reflux disease)   . Hyperlipidemia   . Hypertension   . Osteoporosis    Past Surgical History:  Procedure Laterality Date  . GANGLION CYST EXCISION Left 2013   Left upper arm  . HERNIA REPAIR N/A 2008   MIdline, ventral acquired hernia repair    Social History   Social History  . Marital status: Married    Spouse name: N/A  . Number of children: N/A  . Years of education: College   Occupational History  . Retired Clinical biochemist    Social History Main Topics  . Smoking status: Former Smoker    Packs/day: 1.00    Years: 12.00    Types: Cigarettes    Quit date: 1968  . Smokeless tobacco: Former Systems developer  . Alcohol use No  . Drug use: No  . Sexual activity: Not on file   Other Topics Concern  . Not on file   Social History Narrative  . No narrative on file   Family History  Problem Relation Age of Onset  . Breast cancer Mother   . Lung cancer Father   . Heart disease Brother   . Leukemia Brother   . Prostate cancer Neg Hx   . Colon cancer Neg Hx    Current Outpatient Prescriptions on File Prior to Visit  Medication Sig  . calcium citrate-vitamin D (CITRACAL+D) 315-200 MG-UNIT tablet Take 1 tablet by mouth daily.  . Coenzyme Q10 (CO Q 10) 100 MG CAPS Take by mouth.  . enalapril (VASOTEC) 10 MG tablet Take 1 tablet (10 mg total) by mouth daily.  . Omega-3 Fatty Acids (FISH OIL) 1200 MG CAPS Take by mouth.  [name] omeprazole (PRILOSEC) 20 MG capsule Take 1 capsule (20 mg total) by mouth daily.  . furosemide (LASIX) 20 MG tablet Take  1 tablet (20 mg total) by mouth daily as needed for edema. (Patient not taking: Reported on 06/05/2016)   No current facility-administered medications on file prior to visit.     Review of Systems   Per HPI unless specifically indicated above     Objective:    BP 124/80 (BP Location: Right Arm, Cuff Size: Normal)   Pulse (!) 57   Temp 98.2 F (36.8 C) (Oral)   Resp 16   Ht 6' (1.829 m)   Wt 227 lb (103 kg)   BMI 30.79 kg/m   Wt Readings from Last 3 Encounters:  06/05/16 227 lb (103 kg)  05/30/16 224 lb 3.2 oz (101.7 kg)  03/05/16 229 lb (103.9 kg)    Physical Exam  Constitutional: He is oriented to person, place, and time. He appears well-developed and well-nourished. No  distress.  Well-appearing 82 yr male, comfortable, cooperative  HENT:  Head: Normocephalic and atraumatic.  Mouth/Throat: Oropharynx is clear and moist.  Eyes: Conjunctivae are normal.  Neck: Normal range of motion. Neck supple.  Cardiovascular: Normal rate, regular rhythm, normal heart sounds and intact distal pulses.   No murmur heard. Pulmonary/Chest: Effort normal and breath sounds normal. No respiratory distress. He has no wheezes. He has no rales.  Musculoskeletal: He exhibits edema (Mild improvement bilateral lower ankle limited trace to +1 non pitting edema, no asymmetry).  Neurological: He is alert and oriented to person, place, and time.  Skin: Skin is warm and dry. No rash noted. He is not diaphoretic. No erythema.  Psychiatric: He has a normal mood and affect. His behavior is normal.  Well groomed, good eye contact, normal speech and thoughts  Nursing note and vitals reviewed.  Results for orders placed or performed in visit on 05/30/16  POCT Urinalysis Dipstick  Result Value Ref Range   Color, UA Red    Clarity, UA cloudy    Glucose, UA N    Bilirubin, UA N    Ketones, UA N    Spec Grav, UA 1.025 1.010 - 1.025   Blood, UA mod    pH, UA 5.0 5.0 - 8.0   Protein, UA neg    Urobilinogen, UA 0.2 0.2 or 1.0 E.U./dL   Nitrite, UA pos    Leukocytes, UA Large (3+) (A) Negative      Assessment & Plan:   Problem List Items Addressed This Visit    Hypertension - Primary    Initial BP electronic reading elevated, manual repeat normal range. Home readings normal. No known complications  Plan: 1. Reassurance that his BP is controlled off Furosemide 20mg  daily now on PRN only x 1 dose in 3 months 2. Continue Enalapril 10mg  daily - no change today 3. Continue conservative therapy for chronic venous stasis / LE edema, RICE 4. Continue monitor BP at home, follow-up sooner if abnormal 5. Encourage start regular exercise 6. Follow-up 3 months HTN - future consider low dose  thiazide for swelling or adjust ACEi      Hyperlipidemia    ASCVD risk is elevated >30% Re-discussed risk reduction, had forgotten to start ASA 81mg  - will start this now, future consider very low dose or intermittent dosing Rosuvastatin, likely start with x 1 weekly and gradually increase vs consider Repatha      Bilateral lower extremity edema    Stable chronic mild bilateral lower extremity ankle edema, non pitting - Suspected chronic venous stasis without complication - Reassurance now with no worsening off of Lasix 20mg  daily  Plan: 1. Continue to only use Furosemide 20mg  daily PRN - x 1 use in 3 months is very appropriate, demonstrated not needing regularly 2. Follow-up         No orders of the defined types were placed in this encounter.   Follow up plan: Return in about 3 months (around 09/05/2016) for blood pressure.  [name], [facility] 06/06/2016, 6:46 AM

## 2016-06-05 NOTE — Patient Instructions (Addendum)
Thank you for coming in to clinic today.  1. Blood pressure is better today - Keep checking BP at home several times a week - Continue Enalapril 10mg  daily - I am pleased that you have not needed Furosemide, only take as needed as you are - For swelling can use Compression with stockings, and Elevation - if significant swelling, lift leg above heart level (toes above your nose) to help reduce swelling, most helpful at night after day of being on your feet  If BP at home is >140/90 consistently then notify office and return sooner for re-check BP or Nurse Visit  Start taking baby Aspirin 81mg  once daily to reduce risk of heart attack or stroke  2. DEXA scan for osteoporosis evaluation  [facility] [place], [place] [address] Phone: [phone] Call anytime to schedule DEXA Scan for Osteoporosis Testing  Please schedule a follow-up appointment with Dr. Parks Ranger 3 months for HTN, HLD  If you have any other questions or concerns, please feel free to call the clinic or send a message through [place]. You may also schedule an earlier appointment if necessary.  [name], DO [place]

## 2016-06-06 NOTE — Assessment & Plan Note (Signed)
Stable chronic mild bilateral lower extremity ankle edema, non pitting - Suspected chronic venous stasis without complication - Reassurance now with no worsening off of Lasix 20mg  daily  Plan: 1. Continue to only use Furosemide 20mg  daily PRN - x 1 use in 3 months is very appropriate, demonstrated not needing regularly 2. Follow-up

## 2016-06-06 NOTE — Assessment & Plan Note (Signed)
Initial BP electronic reading elevated, manual repeat normal range. Home readings normal. No known complications  Plan: 1. Reassurance that his BP is controlled off Furosemide 20mg  daily now on PRN only x 1 dose in 3 months 2. Continue Enalapril 10mg  daily - no change today 3. Continue conservative therapy for chronic venous stasis / LE edema, RICE 4. Continue monitor BP at home, follow-up sooner if abnormal 5. Encourage start regular exercise 6. Follow-up 3 months HTN - future consider low dose thiazide for swelling or adjust ACEi

## 2016-06-06 NOTE — Assessment & Plan Note (Signed)
ASCVD risk is elevated >30% Re-discussed risk reduction, had forgotten to start ASA 81mg  - will start this now, future consider very low dose or intermittent dosing Rosuvastatin, likely start with x 1 weekly and gradually increase vs consider Repatha

## 2016-07-03 ENCOUNTER — Encounter: Payer: Self-pay | Admitting: Urology

## 2016-07-03 ENCOUNTER — Ambulatory Visit (INDEPENDENT_AMBULATORY_CARE_PROVIDER_SITE_OTHER): Payer: Medicare Other | Admitting: Urology

## 2016-07-03 VITALS — BP 161/90 | HR 66 | Ht 72.0 in | Wt 225.9 lb

## 2016-07-03 DIAGNOSIS — R310 Gross hematuria: Secondary | ICD-10-CM | POA: Diagnosis not present

## 2016-07-03 NOTE — Progress Notes (Signed)
07/03/2016 3:53 PM   [name] 04/15/1934 [id number]  Referring provider: Mikey College, NP [place], [place] [address]  Chief Complaint  Patient presents with  . Hematuria    HPI: The patient is an 82 year old gentleman who presents today for evaluation of gross hematuria. Patient reports that it was a painless experience. He denied dysuria or suprapubic or flank pain during this encounter. It spontaneously resolved before take any medication for. He has never had this problem before. He has no history of nephrolithiasis or UTI. He states he voids well without any complaints at this time.  He does have a 12 pack year history of smoking. He quit in 1968.    PMH: Past Medical History:  Diagnosis Date  . GERD (gastroesophageal reflux disease)   . Hyperlipidemia   . Hypertension   . Osteoporosis     Surgical History: Past Surgical History:  Procedure Laterality Date  . GANGLION CYST EXCISION Left 2013   Left upper arm  . HERNIA REPAIR N/A 2008   MIdline, ventral acquired hernia repair    Home Medications:  Allergies as of 07/03/2016      Reactions   Antihistamines, Chlorpheniramine-type Other (See Comments)   Can't urinate      Medication List       Accurate as of 07/03/16  3:53 PM. Always use your most recent med list.          calcium citrate-vitamin D 315-200 MG-UNIT tablet Commonly known as:  CITRACAL+D Take 1 tablet by mouth daily.   Co Q 10 100 MG Caps Take by mouth.   enalapril 10 MG tablet Commonly known as:  VASOTEC Take 1 tablet (10 mg total) by mouth daily.   Fish Oil 1200 MG Caps Take by mouth.   furosemide 20 MG tablet Commonly known as:  LASIX Take 1 tablet (20 mg total) by mouth daily as needed for edema.   omeprazole 20 MG capsule Commonly known as:  PRILOSEC Take 1 capsule (20 mg total) by mouth daily.       Allergies:  Allergies  Allergen Reactions  . Antihistamines, Chlorpheniramine-Type Other (See  Comments)    Can't urinate    Family History: Family History  Problem Relation Age of Onset  . Breast cancer Mother   . Lung cancer Father   . Heart disease Brother   . Leukemia Brother   . Prostate cancer Neg Hx   . Colon cancer Neg Hx   . Bladder Cancer Neg Hx   . Kidney cancer Neg Hx     Social History:  reports that he quit smoking about 50 years ago. His smoking use included Cigarettes. He has a 12.00 pack-year smoking history. He has quit using smokeless tobacco. He reports that he does not drink alcohol or use drugs.  ROS: UROLOGY Frequent Urination?: Yes Hard to postpone urination?: Yes Burning/pain with urination?: No Get up at night to urinate?: Yes Leakage of urine?: Yes Urine stream starts and stops?: No Trouble starting stream?: No Do you have to strain to urinate?: Yes Blood in urine?: No Urinary tract infection?: No Sexually transmitted disease?: No Injury to kidneys or bladder?: No Painful intercourse?: No Weak stream?: Yes Erection problems?: Yes Penile pain?: No  Gastrointestinal Nausea?: No Vomiting?: No Indigestion/heartburn?: No Diarrhea?: No Constipation?: No  Constitutional Fever: No Night sweats?: No Weight loss?: No Fatigue?: No  Skin Skin rash/lesions?: No Itching?: No  Eyes Blurred vision?: No Double vision?: No  Ears/Nose/Throat Sore  throat?: No Sinus problems?: No  Hematologic/Lymphatic Swollen glands?: No Easy bruising?: No  Cardiovascular Leg swelling?: Yes Chest pain?: No  Respiratory Cough?: No Shortness of breath?: No  Endocrine Excessive thirst?: No  Musculoskeletal Back pain?: Yes Joint pain?: Yes  Neurological Headaches?: No Dizziness?: No  Psychologic Depression?: No Anxiety?: No  Physical Exam: BP (!) 161/90 (BP Location: Left Arm, Patient Position: Sitting, Cuff Size: Normal)   Pulse 66   Ht 6' (1.829 m)   Wt 225 lb 14.4 oz (102.5 kg)   BMI 30.64 kg/m   Constitutional:  Alert and  oriented, No acute distress. HEENT: [place] AT, moist mucus membranes.  Trachea midline, no masses. Cardiovascular: No clubbing, cyanosis, or edema. Respiratory: Normal respiratory effort, no increased work of breathing. GI: Abdomen is soft, nontender, nondistended, no abdominal masses GU: No CVA tenderness.  Skin: No rashes, bruises or suspicious lesions. Lymph: No cervical or inguinal adenopathy. Neurologic: Grossly intact, no focal deficits, moving all 4 extremities. Psychiatric: Normal mood and affect.  Laboratory Data: Lab Results  Component Value Date   WBC 5.4 04/17/2015   HCT 42 04/17/2015   MCV 90 04/17/2015   PLT 188 04/17/2015    Lab Results  Component Value Date   CREATININE 0.97 05/27/2016    No results found for: PSA  No results found for: TESTOSTERONE  Lab Results  Component Value Date   HGBA1C 5.4 05/27/2016    Urinalysis    Component Value Date/Time   COLORURINE yellow 04/17/2015 0826   PHURINE 6.0 04/17/2015 0826   GLUCOSEU negative 04/17/2015 0826   BILIRUBINUR N 05/30/2016 1325   BILIRUBINUR negative 04/17/2015 0826   KETONESUR negative 04/17/2015 0826   PROTEINUR neg 05/30/2016 1325   PROTEINUR negative 04/17/2015 0826   UROBILINOGEN 0.2 05/30/2016 1325   UROBILINOGEN Normal 04/17/2015 0826   NITRITE pos 05/30/2016 1325   NITRITE Negative 04/17/2015 0826   LEUKOCYTESUR Large (3+) (A) 05/30/2016 1325   LEUKOCYTESUR negative 04/17/2015 0826    Assessment & Plan:   1. Gross hematuria We'll range the patient undergo a CT urogram followed by office cystoscopy for complete hematuria workup.  Return for after CT for cysto.  Nickie Retort, MD  [facility] [address], [place], [place] [address] [phone]

## 2016-07-04 LAB — URINALYSIS, COMPLETE
Bilirubin, UA: NEGATIVE
Glucose, UA: NEGATIVE
Ketones, UA: NEGATIVE
Leukocytes, UA: NEGATIVE
Nitrite, UA: NEGATIVE
Protein, UA: NEGATIVE
RBC, UA: NEGATIVE
Specific Gravity, UA: 1.015 (ref 1.005–1.030)
Urobilinogen, Ur: 0.2 mg/dL (ref 0.2–1.0)
pH, UA: 6.5 (ref 5.0–7.5)

## 2016-07-23 ENCOUNTER — Ambulatory Visit (INDEPENDENT_AMBULATORY_CARE_PROVIDER_SITE_OTHER): Payer: Medicare Other | Admitting: Nurse Practitioner

## 2016-07-23 ENCOUNTER — Encounter: Payer: Self-pay | Admitting: Nurse Practitioner

## 2016-07-23 VITALS — BP 138/84 | HR 63 | Temp 98.0°F | Resp 16 | Ht 72.0 in | Wt 228.0 lb

## 2016-07-23 DIAGNOSIS — I82401 Acute embolism and thrombosis of unspecified deep veins of right lower extremity: Secondary | ICD-10-CM | POA: Diagnosis not present

## 2016-07-23 MED ORDER — APIXABAN 5 MG PO TABS: ORAL_TABLET | ORAL | 0 refills | Status: DC

## 2016-07-23 NOTE — Progress Notes (Signed)
Subjective:    Patient ID: Randy [name], male    DOB: Dec 04, 1934, 82 y.o.   MRN: [id number]  Randy [name] is a 82 y.o. male presenting on 07/23/2016 for Joint Swelling (right side onset last night painful)   HPI R lower leg swelling - First noticed swelling last night,  Woke up in pain.  He notes current dull pain/ache 9/10 on numeric pain scale.  He has a more sharp pain w/ walking or moving his foot w/ plantar flexion.  - Swelling of Right leg from ankle to thigh. - Localized erythema - Pt notes he flew yesterday (1:30pm - 7pm) and didn't have time to walk. Has never had prior DVT.  - Not on anticoagulant, but is taking 81mg  aspirin.   Social History  Substance Use Topics  . Smoking status: Former Smoker    Packs/day: 1.00    Years: 12.00    Types: Cigarettes    Quit date: 1968  . Smokeless tobacco: Former Systems developer  . Alcohol use No    Review of Systems Per HPI unless specifically indicated above     Objective:    BP 138/84   Pulse 63   Temp 98 F (36.7 C) (Oral)   Resp 16   Ht 6' (1.829 m)   Wt 228 lb (103.4 kg)   BMI 30.92 kg/m   Wt Readings from Last 3 Encounters:  07/23/16 228 lb (103.4 kg)  07/03/16 225 lb 14.4 oz (102.5 kg)  06/05/16 227 lb (103 kg)    Physical Exam  Constitutional: He is oriented to person, place, and time. He appears well-developed and well-nourished. No distress.  HENT:  Head: Normocephalic and atraumatic.  Eyes: EOM are normal.  Cardiovascular: Normal rate, regular rhythm, normal heart sounds and intact distal pulses.   Pulmonary/Chest: Effort normal and breath sounds normal. No respiratory distress.  Neurological: He is alert and oriented to person, place, and time. No cranial nerve deficit.  Decreased sensation of bilateral lower extremities  Skin: Skin is warm, dry and intact.     Leg circumference 7 inches below tibial tuberosity: Left: 16 1/4 inch Right: 17 inches  Psychiatric: He has a normal mood and affect. His behavior is  normal. Judgment and thought content normal.  Vitals reviewed.    Results for orders placed or performed in visit on 07/03/16  Urinalysis, Complete  Result Value Ref Range   Specific Gravity, UA 1.015 1.005 - 1.030   pH, UA 6.5 5.0 - 7.5   Color, UA Yellow Yellow   Appearance Ur Clear Clear   Leukocytes, UA Negative Negative   Protein, UA Negative Negative/Trace   Glucose, UA Negative Negative   Ketones, UA Negative Negative   RBC, UA Negative Negative   Bilirubin, UA Negative Negative   Urobilinogen, Ur 0.2 0.2 - 1.0 mg/dL   Nitrite, UA Negative Negative      Assessment & Plan:   Problem List Items Addressed This Visit    None    Visit Diagnoses    Leg DVT (deep venous thromboembolism), acute, right ([place])    -  Primary Pt w/ presumed DVT until further evaluation.  Wells DVT scoring = 4.  Pt w/ recent sedentary flight travel.  Pain evaluation complicated by peripheral neuropathy and changed pain sensation.    Plan: 1. Initiate Eliquis two tablets twice daily x 7 days then two tablets one daily for 3 weeks. Re-evaluate and consider continuing therapy for longer.  Limit to shortest course possible r/t pt  risk for falls. 2. Discussed precautions of being on anticoagulation.   3. Obtain bilat [place] evaluate DVT ASAP.   4. Instructed pt to rest, keep leg elevated.  Reviewed signs of PE, MI, CVA.  Seek care in ED via EMS if any of these symptoms occur.   Relevant Medications   apixaban (ELIQUIS STARTER PACK) 5 MG TABS tablet   Other Relevant Orders   VAS [place] LOWER EXTREMITY VENOUS (DVT)      Meds ordered this encounter  Medications  . apixaban (ELIQUIS STARTER PACK) 5 MG TABS tablet    Sig: Take 2 tablets twice daily for 7 days, then two tablets once daily.    Dispense:  60 tablet    Refill:  0    Order Specific Question:   Supervising Provider    Answer:   [name] [[id number]]      Follow up plan: Return in about 3 days (around 07/26/2016) for DVT  followup.   [name], DNP, AGPCNP-BC Adult Gerontology Primary Care Nurse Practitioner [facility] 07/24/2016, 10:11 PM

## 2016-07-23 NOTE — Progress Notes (Deleted)
Flew yesterday (1:30 arrived in [place]). Didn't have time to walk. Has not had DVT ever.   Not on anticoagulant (81 aspirin).   First noticed swelling last night,  Woke up in pain.  Dull pain/ache 0-10 9/10 on pain scale.  More sharp pain w/ movement   Swelling - Left leg 16 1/4 - 17 in Right leg

## 2016-07-23 NOTE — Patient Instructions (Addendum)
Randy [name], Thank you for coming in to clinic today.  1. You likely have a DVT You need an ultrasound of your legs.  This will be done at [facility] [address], [place] [address]  Main: [phone]   They will call your for your appointment.  2. Take eliquis 5 mg tablet - two tablets twice daily ( about every 12 hours) for 7 days, then two tablets once daily for about 3 weeks.  We may continue it for longer. This will dissolve a clot in your leg. Use only your electric razor while taking eliquis.  3. If you have chest pain, signs of a stroke, or trouble breathing, call 9-1-1 Stroke warning signs: Face -drooping Arms/leg - weakness Speech -slurring or can't get the words out Time -act fast and call 9-1-1    Please schedule a follow-up appointment with [name], AGNP to Return in about 3 days (around 07/26/2016) for DVT followup.  If you have any other questions or concerns, please feel free to call the clinic or send a message through [place]. You may also schedule an earlier appointment if necessary.  [name], DNP, AGNP-BC Adult Gerontology Nurse Practitioner [facility]   Deep Vein Thrombosis A deep vein thrombosis (DVT) is a blood clot (thrombus) that usually occurs in a deep, larger vein of the lower leg or the pelvis, or in an upper extremity such as the arm. These are dangerous and can lead to serious and even life-threatening complications if the clot travels to the lungs. A DVT can damage the valves in your leg veins so that instead of flowing upward, the blood pools in the lower leg. This is called post-thrombotic syndrome, and it can result in pain, swelling, discoloration, and sores on the leg. What are the causes? A DVT is caused by the formation of a blood clot in your leg, pelvis, or arm. Usually, several things contribute to the formation of blood clots. A clot may develop  when:  Your blood flow slows down.  Your vein becomes damaged in some way.  You have a condition that makes your blood clot more easily.  What increases the risk? A DVT is more likely to develop in:  People who are older, especially over 64 years of age.  People who are overweight (obese).  People who sit or lie still for a long time, such as during long-distance travel (over 4 hours), bed rest, hospitalization, or during recovery from certain medical conditions like a stroke.  People who do not engage in much physical activity (sedentary lifestyle).  People who have chronic breathing disorders.  People who have a personal or family history of blood clots or blood clotting disease.  People who have peripheral vascular disease (PVD), diabetes, or some types of cancer.  People who have heart disease, especially if the person had a recent heart attack or has congestive heart failure.  People who have neurological diseases that affect the legs (leg paresis).  People who have had a traumatic injury, such as breaking a hip or leg.  People who have recently had major or lengthy surgery, especially on the hip, knee, or abdomen.  People who have had a central line placed inside a large vein.  People who take medicines that contain the hormone estrogen. These include birth control pills and hormone replacement therapy.  Pregnancy or during childbirth or the postpartum period.  Long plane flights (over 8 hours).  What are the signs or symptoms?  Symptoms of a DVT can include:  Swelling of your leg or arm, especially if one side is much worse.  Warmth and redness of your leg or arm, especially if one side is much worse.  Pain in your arm or leg. If the clot is in your leg, symptoms may be more noticeable or worse when you stand or walk.  A feeling of pins and needles, if the clot is in the arm.  The symptoms of a DVT that has traveled to the lungs (pulmonary embolism, PE)  usually start suddenly and include:  Shortness of breath while active or at rest.  Coughing or coughing up blood or blood-tinged mucus.  Chest pain that is often worse with deep breaths.  Rapid or irregular heartbeat.  Feeling light-headed or dizzy.  Fainting.  Feeling anxious.  Sweating.  There may also be pain and swelling in a leg if that is where the blood clot started. These symptoms may represent a serious problem that is an emergency. Do not wait to see if the symptoms will go away. Get medical help right away. Call your local emergency services (911 in the [place] Do not drive yourself to the hospital. How is this diagnosed? Your health care provider will take a medical history and perform a physical exam. You may also have other tests, including:  Blood tests to assess the clotting properties of your blood.  Imaging tests, such as CT, ultrasound, MRI, X-ray, and other tests to see if you have clots anywhere in your body.  How is this treated? After a DVT is identified, it can be treated. The type of treatment that you receive depends on many factors, such as the cause of your DVT, your risk for bleeding or developing more clots, and other medical conditions that you have. Sometimes, a combination of treatments is necessary. Treatment options may be combined and include:  Monitoring the blood clot with ultrasound.  Taking medicines by mouth, such as newer blood thinners (anticoagulants), thrombolytics, or warfarin.  Taking anticoagulant medicine by injection or through an IV tube.  Wearing compression stockings or using different types ofdevices.  Surgery (rare) to remove the blood clot or to place a filter in your abdomen to stop the blood clot from traveling to your lungs.  Treatments for a DVT are often divided into immediate treatment and long-term treatment (up to 3 months after DVT). You can work with your health care provider to choose the treatment program that  is best for you. Follow these instructions at home: If you are taking a newer oral anticoagulant:  Take the medicine every single day at the same time each day.  Understand what foods and drugs interact with this medicine.  Understand that there are no regular blood tests required when using this medicine.  Understand the side effects of this medicine, including excessive bruising or bleeding. Ask your health care provider or pharmacist about other possible side effects. If you are taking warfarin:  Understand how to take warfarin and know which foods can affect how warfarin works in Veterinary surgeon.  Understand that it is dangerous to take too much or too little warfarin. Too much warfarin increases the risk of bleeding. Too little warfarin continues to allow the risk for blood clots.  Follow your PT and INR blood testing schedule. The PT and INR results allow your health care provider to adjust your dose of warfarin. It is very important that you have your  PT and INR tested as often as told by your health care provider.  Avoid major changes in your diet, or tell your health care provider before you change your diet. Arrange a visit with a registered dietitian to answer your questions. Many foods, especially foods that are high in vitamin K, can interfere with warfarin and affect the PT and INR results. Eat a consistent amount of foods that are high in vitamin K, such as: ? Spinach, kale, broccoli, cabbage, collard greens, turnip greens, Brussels sprouts, peas, cauliflower, seaweed, and parsley. ? Beef liver and pork liver. ? Green tea. ? Soybean oil.  Tell your health care provider about any and all medicines, vitamins, and supplements that you take, including aspirin and other over-the-counter anti-inflammatory medicines. Be especially cautious with aspirin and anti-inflammatory medicines. Do not take those before you ask your health care provider if it is safe to do so. This is important  because many medicines can interfere with warfarin and affect the PT and INR results.  Do not start or stop taking any over-the-counter or prescription medicine unless your health care provider or pharmacist tells you to do so. If you take warfarin, you will also need to do these things:  Hold pressure over cuts for longer than usual.  Tell your dentist and other health care providers that you are taking warfarin before you have any procedures in which bleeding may occur.  Avoid alcohol or drink very small amounts. Tell your health care provider if you change your alcohol intake.  Do not use tobacco products, including cigarettes, chewing tobacco, and e-cigarettes. If you need help quitting, ask your health care provider.  Avoid contact sports.  General instructions  Take over-the-counter and prescription medicines only as told by your health care provider. Anticoagulant medicines can have side effects, including easy bruising and difficulty stopping bleeding. If you are prescribed an anticoagulant, you will also need to do these things: ? Hold pressure over cuts for longer than usual. ? Tell your dentist and other health care providers that you are taking anticoagulants before you have any procedures in which bleeding may occur. ? Avoid contact sports.  Wear a medical alert bracelet or carry a medical alert card that says you have had a PE.  Ask your health care provider how soon you can go back to your normal activities. Stay active to prevent new blood clots from forming.  Make sure to exercise while traveling or when you have been sitting or standing for a long period of time. It is very important to exercise. Exercise your legs by walking or by tightening and relaxing your leg muscles often. Take frequent walks.  Wear compression stockings as told by your health care provider to help prevent more blood clots from forming.  Do not use tobacco products, including cigarettes, chewing  tobacco, and e-cigarettes. If you need help quitting, ask your health care provider.  Keep all follow-up appointments with your health care provider. This is important. How is this prevented? Take these actions to decrease your risk of developing another DVT:  Exercise regularly. For at least 30 minutes every day, engage in: ? Activity that involves moving your arms and legs. ? Activity that encourages good blood flow through your body by increasing your heart rate.  Exercise your arms and legs every hour during long-distance travel (over 4 hours). Drink plenty of water and avoid drinking alcohol while traveling.  Avoid sitting or lying in bed for long periods of time without moving  your legs.  Maintain a weight that is appropriate for your height. Ask your health care provider what weight is healthy for you.  If you are a woman who is over 52 years of age, avoid unnecessary use of medicines that contain estrogen. These include birth control pills.  Do not smoke, especially if you take estrogen medicines. If you need help quitting, ask your health care provider.  If you are hospitalized, prevention measures may include:  Early walking after surgery, as soon as your health care provider says that it is safe.  Receiving anticoagulants to prevent blood clots.If you cannot take anticoagulants, other options may be available, such as wearing compression stockings or using different types of devices.  Get help right away if:  You have new or increased pain, swelling, or redness in an arm or leg.  You have numbness or tingling in an arm or leg.  You have shortness of breath while active or at rest.  You have chest pain.  You have a rapid or irregular heartbeat.  You feel light-headed or dizzy.  You cough up blood.  You notice blood in your vomit, bowel movement, or urine. These symptoms may represent a serious problem that is an emergency. Do not wait to see if the symptoms will  go away. Get medical help right away. Call your local emergency services (911 in the [place] Do not drive yourself to the hospital. This information is not intended to replace advice given to you by your health care provider. Make sure you discuss any questions you have with your health care provider. Document Released: 12/24/2004 Document Revised: 06/01/2015 Document Reviewed: 04/20/2014 Elsevier Interactive Patient Education  2017 [employer].

## 2016-07-25 ENCOUNTER — Encounter: Payer: Self-pay | Admitting: Emergency Medicine

## 2016-07-25 ENCOUNTER — Emergency Department
Admission: EM | Admit: 2016-07-25 | Discharge: 2016-07-25 | Disposition: A | Discharge status: Home / Self Care | Payer: Medicare Other | Attending: Emergency Medicine | Admitting: Emergency Medicine

## 2016-07-25 ENCOUNTER — Other Ambulatory Visit: Payer: 59

## 2016-07-25 ENCOUNTER — Telehealth: Payer: Self-pay | Admitting: Nurse Practitioner

## 2016-07-25 ENCOUNTER — Emergency Department: Payer: Medicare Other

## 2016-07-25 DIAGNOSIS — Z7901 Long term (current) use of anticoagulants: Secondary | ICD-10-CM | POA: Insufficient documentation

## 2016-07-25 DIAGNOSIS — L03115 Cellulitis of right lower limb: Secondary | ICD-10-CM | POA: Insufficient documentation

## 2016-07-25 DIAGNOSIS — R609 Edema, unspecified: Secondary | ICD-10-CM | POA: Diagnosis not present

## 2016-07-25 DIAGNOSIS — M7989 Other specified soft tissue disorders: Secondary | ICD-10-CM | POA: Diagnosis not present

## 2016-07-25 DIAGNOSIS — Z87891 Personal history of nicotine dependence: Secondary | ICD-10-CM | POA: Diagnosis not present

## 2016-07-25 DIAGNOSIS — I10 Essential (primary) hypertension: Secondary | ICD-10-CM | POA: Diagnosis not present

## 2016-07-25 DIAGNOSIS — Z79899 Other long term (current) drug therapy: Secondary | ICD-10-CM | POA: Insufficient documentation

## 2016-07-25 DIAGNOSIS — R2241 Localized swelling, mass and lump, right lower limb: Secondary | ICD-10-CM | POA: Diagnosis present

## 2016-07-25 LAB — CBC
HCT: 38.4 % — ABNORMAL LOW (ref 40.0–52.0)
Hemoglobin: 13.2 g/dL (ref 13.0–18.0)
MCH: 30.1 pg (ref 26.0–34.0)
MCHC: 34.3 g/dL (ref 32.0–36.0)
MCV: 87.7 fL (ref 80.0–100.0)
Platelets: 210 10*3/uL (ref 150–440)
RBC: 4.38 MIL/uL — ABNORMAL LOW (ref 4.40–5.90)
RDW: 13.8 % (ref 11.5–14.5)
WBC: 6.2 10*3/uL (ref 3.8–10.6)

## 2016-07-25 LAB — BASIC METABOLIC PANEL
Anion gap: 9 (ref 5–15)
BUN: 20 mg/dL (ref 6–20)
CO2: 28 mmol/L (ref 22–32)
Calcium: 8.7 mg/dL — ABNORMAL LOW (ref 8.9–10.3)
Chloride: 103 mmol/L (ref 101–111)
Creatinine, Ser: 0.91 mg/dL (ref 0.61–1.24)
GFR calc Af Amer: >60 mL/min (ref >60)
GFR calc non Af Amer: >60 mL/min (ref >60)
Glucose, Bld: 154 mg/dL — ABNORMAL HIGH (ref 65–99)
Potassium: 3.6 mmol/L (ref 3.5–5.1)
Sodium: 140 mmol/L (ref 135–145)

## 2016-07-25 MED ORDER — CEPHALEXIN 500 MG PO CAPS: 500.0000 mg | ORAL_CAPSULE | Freq: Two times a day (BID) | ORAL | 0 refills | Status: DC

## 2016-07-25 MED ORDER — FUROSEMIDE 20 MG PO TABS
20.0000 mg | ORAL_TABLET | Freq: Every day | ORAL | 0 refills | Status: DC
Start: 2016-07-25 — End: 2016-07-31

## 2016-07-25 MED ORDER — CEPHALEXIN 500 MG PO CAPS
500.0000 mg | ORAL_CAPSULE | Freq: Once | ORAL | Status: AC
  Administered 2016-07-25: 500 mg via ORAL
  Filled 2016-07-25: qty 1

## 2016-07-25 MED ORDER — POTASSIUM CHLORIDE ER 10 MEQ PO TBCR: 10.0000 meq | EXTENDED_RELEASE_TABLET | Freq: Every day | ORAL | 0 refills | Status: DC

## 2016-07-25 NOTE — Telephone Encounter (Signed)
Pt was in earlier this week with blood clot in leg and he said it was aching.  Please advise pt [phone]

## 2016-07-25 NOTE — Progress Notes (Signed)
I have reviewed this encounter including the documentation in this note and/or discussed this patient with the provider, [name], AGPCNP-BC. I am certifying that I agree with the content of this note as supervising physician.  [name], [facility] 07/25/2016, 8:11 AM

## 2016-07-25 NOTE — Telephone Encounter (Signed)
The pt called earlier complaining of worsening pain in the Rt ankle x 1 day. The pain worsen with movement, no SOB. He states swelling is about the same as it was on Tuesday.

## 2016-07-25 NOTE — ED Triage Notes (Addendum)
Pt to ED via EMS from home with c/o RT lower extrem edema. Pt has noted swelling x4days , states recurrent issues, currently on Elequis x3days.  Pt A&OX4, VS stable. + pulses. Pt was seen at [facility] this week and told to follow up for worsening symptoms. Pt recently traveled by flight

## 2016-07-25 NOTE — ED Provider Notes (Signed)
[facility] Emergency Department Provider Note ____________________________________________   I have reviewed the triage vital signs and the triage nursing note.  HISTORY  Chief Complaint Leg Swelling   Historian Patient  HPI Randy [name] is a 82 y.o. male who is here for evaluation of right leg pain and swelling. Sounds like it started about 4 days ago. He was seen on Tuesday at primary care doctor's office and seen by a nurse practitioner who was concerned about DVT, but no imaging was done. He was initiated on Eloquis management, and it sounds like was scheduled for an ultrasound on Monday morning. Patient has had continued pain and swelling in that leg and came in for evaluation. X-ray pain is moderate. No history of trauma. Some bruising and skin redness near his calf and ankle without skin rash.  Recent long car ride.  No chest pain or shortness of breath.    Past Medical History:  Diagnosis Date  . GERD (gastroesophageal reflux disease)   . Hyperlipidemia   . Hypertension   . Osteoporosis     Patient Active Problem List   Diagnosis Date Noted  . BPH without obstruction/lower urinary tract symptoms 03/06/2016  . Osteoarthritis of multiple joints 03/06/2016  . Degenerative joint disease (DJD) of lumbar spine 03/06/2016  . Hypertension 03/05/2016  . Hyperlipidemia 03/05/2016  . Osteoporosis 03/05/2016  . GERD (gastroesophageal reflux disease) 03/05/2016  . Bilateral lower extremity edema 03/05/2016    Past Surgical History:  Procedure Laterality Date  . GANGLION CYST EXCISION Left 2013   Left upper arm  . HERNIA REPAIR N/A 2008   MIdline, ventral acquired hernia repair    Prior to Admission medications   Medication Sig Start Date End Date Taking? Authorizing Provider  apixaban (ELIQUIS STARTER PACK) 5 MG TABS tablet Take 2 tablets twice daily for 7 days, then two tablets once daily. 07/23/16   Mikey College, NP  calcium  citrate-vitamin D (CITRACAL+D) 315-200 MG-UNIT tablet Take 1 tablet by mouth daily.    [provider]  cephALEXin (KEFLEX) 500 MG capsule Take 1 capsule (500 mg total) by mouth 2 (two) times daily. 07/25/16   Lisa Roca, MD  Coenzyme Q10 (CO Q 10) 100 MG CAPS Take by mouth.    [provider]  enalapril (VASOTEC) 10 MG tablet Take 1 tablet (10 mg total) by mouth daily. 03/05/16   Karamalegos, Devonne Doughty, DO  furosemide (LASIX) 20 MG tablet Take 1 tablet (20 mg total) by mouth daily as needed for edema. 03/05/16   Karamalegos, Devonne Doughty, DO  furosemide (LASIX) 20 MG tablet Take 1 tablet (20 mg total) by mouth daily. 07/25/16   Lisa Roca, MD  Omega-3 Fatty Acids (FISH OIL) 1200 MG CAPS Take by mouth.    [provider]  omeprazole (PRILOSEC) 20 MG capsule Take 1 capsule (20 mg total) by mouth daily. 03/05/16   Karamalegos, Devonne Doughty, DO  potassium chloride (K-DUR) 10 MEQ tablet Take 1 tablet (10 mEq total) by mouth daily. 07/25/16   Lisa Roca, MD    Allergies  Allergen Reactions  . Antihistamines, Chlorpheniramine-Type Other (See Comments)    Can't urinate    Family History  Problem Relation Age of Onset  . Breast cancer Mother   . Lung cancer Father   . Heart disease Brother   . Leukemia Brother   . Prostate cancer Neg Hx   . Colon cancer Neg Hx   . Bladder Cancer Neg Hx   . Kidney  cancer Neg Hx     Social History Social History  Substance Use Topics  . Smoking status: Former Smoker    Packs/day: 1.00    Years: 12.00    Types: Cigarettes    Quit date: 1968  . Smokeless tobacco: Former Systems developer  . Alcohol use No    Review of Systems  Constitutional: Negative for fever. Eyes: Negative for visual changes. ENT: Negative for sore throat. Cardiovascular: Negative for chest pain. Respiratory: Negative for shortness of breath. Gastrointestinal: Negative for abdominal pain, vomiting and diarrhea. Genitourinary: Negative for  dysuria. Musculoskeletal: Negative for back pain. Skin: Negative for rash. Neurological: Negative for headache.  ____________________________________________   PHYSICAL EXAM:  VITAL SIGNS: ED Triage Vitals  Enc Vitals Group     BP 07/25/16 1603 (!) 148/83     Pulse Rate 07/25/16 1603 79     Resp 07/25/16 1603 16     Temp 07/25/16 1603 98.3 F (36.8 C)     Temp Source 07/25/16 1603 Oral     SpO2 07/25/16 1603 95 %     Weight 07/25/16 1604 220 lb (99.8 kg)     Height 07/25/16 1604 6' (1.829 m)     Head Circumference --      Peak Flow --      Pain Score 07/25/16 1602 0     Pain Loc --      Pain Edu? --      Excl. in [place]? --      Constitutional: Alert and oriented. Well appearing and in no distress. HEENT   Head: Normocephalic and atraumatic.      Eyes: Conjunctivae are normal. Pupils equal and round.       Ears:         Nose: No congestion/rhinnorhea.   Mouth/Throat: Mucous membranes are moist.   Neck: No stridor. Cardiovascular/Chest: Normal rate, regular rhythm.  No murmurs, rubs, or gallops. Respiratory: Normal respiratory effort without tachypnea nor retractions. Breath sounds are clear and equal bilaterally. No wheezes/rales/rhonchi. Gastrointestinal: Soft. No distention, no guarding, no rebound. Nontender.    Genitourinary/rectal:Deferred Musculoskeletal: Nontender with normal range of motion in all extremities. Trace lower extremity edema left lower extremity. Right leg is significantly swollen, 3+ lower extremity pitting edema and calf tenderness. Some mild bruising appearance to the inner right ankle area without ankle effusion. Neurologic:  Normal speech and language. No gross or focal neurologic deficits are appreciated. Skin:  Skin is warm, dry and intact. No rash noted. Psychiatric: Mood and affect are normal. Speech and behavior are normal. Patient exhibits appropriate insight and judgment.   ____________________________________________  LABS  (pertinent positives/negatives)  Labs Reviewed  CBC - Abnormal; Notable for the following:       Result Value   RBC 4.38 (*)    HCT 38.4 (*)    All other components within normal limits  BASIC METABOLIC PANEL - Abnormal; Notable for the following:    Glucose, Bld 154 (*)    Calcium 8.7 (*)    All other components within normal limits    ____________________________________________    EKG I, Lisa Roca, MD, the attending physician have personally viewed and interpreted all ECGs.  None ____________________________________________  RADIOLOGY All Xrays were viewed by me. Imaging interpreted by Radiologist.  Venous Doppler ultrasound:  IMPRESSION: No evidence of DVT within either lower extremity. __________________________________________  PROCEDURES  Procedure(s) performed: None  Critical Care performed: None  ____________________________________________   ED COURSE / ASSESSMENT AND PLAN  Pertinent labs & imaging  results that were available during my care of the patient were reviewed by me and considered in my medical decision making (see chart for details).   If the patient was placed on Eloquis out of presumptive diagnosis of DVT, without imaging diagnosis of DVT for complaint of right leg swelling.  I am going to go ahead and do the diagnostic test.  Ultrasound negative for DVT in either leg. I've asked patient to stop the Eloquis.  Unclear initiating event with the significant right-sided swelling with respect to the left side, he does have some bruising that would almost indicate possible ankle sprain, but patient does not report a history of such. There is also some erythema and warmth and I discussed with him whether or not to hold off on antibiotics for go ahead and try, which is to proceed. Patient was started on a course of Keflex. In terms of the actual swelling, patient stated that he thought he was on Lasix, but he did not bring his pills with him, and  so then he thought maybe he is not on it. His medical chart history stated 20 mg Lasix. In any case if he is not on Lasix he should do 20 mg Lasix for 3 days with potassium supplementation, if he is already on 20 mg, he will be taking 40 mg Lasix daily for 3 days. He will need recheck early next week.   We discussed he does need close follow-up, potentially evaluation with cardiologist although he is not showing any signs of CHF here.    CONSULTATIONS: None Patient / Family / Caregiver informed of clinical course, medical decision-making process, and agree with plan.   I discussed return precautions, follow-up instructions, and discharge instructions with patient and/or family.  Discharge Instructions : You leg was evaluated and no DVT was found.  Please increase her Lasix dose to 40 mg daily for 3 days. We discussed trying a course of antibiotics as well for possible early cellulitis.  Return to the emergency room immediately for any worsening condition including no worsening leg pain, weakness, numbness, skin rash, fever, or any other symptoms concerning to you.  ___________________________________________   FINAL CLINICAL IMPRESSION(S) / ED DIAGNOSES   Final diagnoses:  Peripheral edema  Cellulitis of right lower extremity              Note: This dictation was prepared with Dragon dictation. Any transcriptional errors that result from this process are unintentional    Lisa Roca, MD 07/25/16 2044

## 2016-07-25 NOTE — Discharge Instructions (Signed)
You leg was evaluated and no DVT was found.  Please increase her Lasix dose to 40 mg daily for 3 days. We discussed trying a course of antibiotics as well for possible early cellulitis.  Return to the emergency room immediately for any worsening condition including no worsening leg pain, weakness, numbness, skin rash, fever, or any other symptoms concerning to you.

## 2016-07-25 NOTE — Telephone Encounter (Signed)
Pt was requested to follow up w/ me on Friday anyway.  Please schedule an appointment for reassessment.  Thanks

## 2016-07-26 ENCOUNTER — Encounter: Payer: Self-pay | Admitting: Nurse Practitioner

## 2016-07-26 ENCOUNTER — Ambulatory Visit (INDEPENDENT_AMBULATORY_CARE_PROVIDER_SITE_OTHER): Payer: Medicare Other | Admitting: Nurse Practitioner

## 2016-07-26 VITALS — BP 116/63 | HR 68 | Temp 98.0°F | Ht 72.0 in | Wt 225.0 lb

## 2016-07-26 DIAGNOSIS — L03115 Cellulitis of right lower limb: Secondary | ICD-10-CM | POA: Diagnosis not present

## 2016-07-26 NOTE — Progress Notes (Signed)
Subjective:    Patient ID: Randy [name], male    DOB: October 21, 1934, 82 y.o.   MRN: [id number]  Muhanad Torosyan is a 82 y.o. male presenting on 07/26/2016 for Cellulitis (right lower leg)   HPI  Cellulitis RLE Pt notes persistently worsening redness, swelling, and pain.  Pt decided to seek care in ER for increased, constant pain on 07/26/26.  At ER, pt did have BLE ultrasound doppler study performed to rule out DVT. There is no DVT present.   Pt received prescriptions for cephalexin 500 mg bidx7 days, lasix 20 mg x 3 days w/ potassium 10 meq x 3 days.  He has not yet gotten these prescriptions filled.  He notes current level of pain 8/10 and is described as constant aching/soreness.  Social History  Substance Use Topics  . Smoking status: Former Smoker    Packs/day: 1.00    Years: 12.00    Types: Cigarettes    Quit date: 1968  . Smokeless tobacco: Former Systems developer  . Alcohol use No    Review of Systems Per HPI unless specifically indicated above     Objective:    BP 116/63 (BP Location: Right Arm, Patient Position: Sitting, Cuff Size: Large)   Pulse 68   Temp 98 F (36.7 C) (Oral)   Ht 6' (1.829 m)   Wt 225 lb (102.1 kg)   BMI 30.52 kg/m   Wt Readings from Last 3 Encounters:  07/26/16 225 lb (102.1 kg)  07/25/16 220 lb (99.8 kg)  07/23/16 228 lb (103.4 kg)    Physical Exam  Constitutional: He is oriented to person, place, and time. He appears well-developed and well-nourished. No distress.  HENT:  Head: Normocephalic and atraumatic.  Musculoskeletal:  Pt ambulates w/ cane.  Neurological: He is alert and oriented to person, place, and time.  Skin: Skin is warm and dry. There is erythema.     Psychiatric: He has a normal mood and affect. His behavior is normal.         Results for orders placed or performed during the hospital encounter of 07/25/16  CBC  Result Value Ref Range   WBC 6.2 3.8 - 10.6 K/uL   RBC 4.38 (L) 4.40 - 5.90 MIL/uL   Hemoglobin 13.2 13.0 - 18.0  g/dL   HCT 38.4 (L) 40.0 - 52.0 %   MCV 87.7 80.0 - 100.0 fL   MCH 30.1 26.0 - 34.0 pg   MCHC 34.3 32.0 - 36.0 g/dL   RDW 13.8 11.5 - 14.5 %   Platelets 210 150 - 440 K/uL  Basic metabolic panel  Result Value Ref Range   Sodium 140 135 - 145 mmol/L   Potassium 3.6 3.5 - 5.1 mmol/L   Chloride 103 101 - 111 mmol/L   CO2 28 22 - 32 mmol/L   Glucose, Bld 154 (H) 65 - 99 mg/dL   BUN 20 6 - 20 mg/dL   Creatinine, Ser 0.91 0.61 - 1.24 mg/dL   Calcium 8.7 (L) 8.9 - 10.3 mg/dL   GFR calc non Af Amer >60 >60 mL/min   GFR calc Af Amer >60 >60 mL/min   Anion gap 9 5 - 15      Assessment & Plan:   Problem List Items Addressed This Visit    None    Visit Diagnoses    Cellulitis of right lower extremity    -  Primary Pt w/ worsening of cellulitis after office visit on 07/23/16 when presenting symptoms were c/w  DVT and pt was diagnosed and empirically treated for DVT.  Pt asked at that visit to follow up in 3 days, but no appointment was made.  Pt presented to ED 2 days later and was diagnosed w/ cellulitis.  Pt has not yet initiated treatment w/ antibiotics.  Current symptoms c/w cellulitis and non-draining wound.  Plan: 1. Continue with treatment as directed by ED.   2. Reviewed followup criteria for clinic (localized worsening on antibiotics) vs ED (systemic symptoms). 3. Follow up if no resolution in 5-7 days.      No orders of the defined types were placed in this encounter.     Follow up plan: Return 5-7 days if symptoms worsen or fail to improve.  [name], DNP, AGPCNP-BC Adult Gerontology Primary Care Nurse Practitioner [facility] 07/27/2016, 11:01 PM

## 2016-07-26 NOTE — Patient Instructions (Addendum)
Randy [name], Thank you for coming in to clinic today.  1. For your leg, Continue the medications you were prescribed at the ER. - furosemide - is a diuretic and will make you pee but will help with swelling.  It also gets rid of potassium, so make sure you take the prescribed potassium supplement.  -  Cephalexin is your antibiotic and will help heal your skin infection.  YOU DO NOT have a clot.  STOP taking the Eliquis.  - Follow up at [facility] if: - your skin starts draining - 3 days after your antibiotic your leg is still red - Call if no improvement in 5-7 days and we will send a different antibiotic regimen.  If you have a fever, chills, sweats, feel bad - You will go to the ER again.  Please schedule a follow-up appointment with [name], AGNP to Return 5-7 days if symptoms worsen or fail to improve.  If you have any other questions or concerns, please feel free to call the clinic or send a message through [place]. You may also schedule an earlier appointment if necessary.  [name], DNP, AGNP-BC Adult Gerontology Nurse Practitioner [place]

## 2016-07-29 ENCOUNTER — Ambulatory Visit: Admission: RE | Admit: 2016-07-29 | Payer: 59 | Source: Ambulatory Visit

## 2016-07-29 ENCOUNTER — Telehealth: Payer: Self-pay | Admitting: Family Medicine

## 2016-07-29 NOTE — Progress Notes (Signed)
I have reviewed this encounter including the documentation in this note and/or discussed this patient with the provider, [name], AGPCNP-BC. I am certifying that I agree with the content of this note as supervising physician.  [name], [facility] 07/29/2016, 12:19 PM

## 2016-07-29 NOTE — Telephone Encounter (Signed)
Does pt have a CT scheduled and where?  Please call [phone]

## 2016-07-29 NOTE — Telephone Encounter (Signed)
Pt advised.

## 2016-07-31 ENCOUNTER — Encounter: Payer: Self-pay | Admitting: Family Medicine

## 2016-07-31 ENCOUNTER — Ambulatory Visit (INDEPENDENT_AMBULATORY_CARE_PROVIDER_SITE_OTHER): Payer: Medicare Other | Admitting: Family Medicine

## 2016-07-31 VITALS — BP 122/72 | HR 64 | Temp 97.7°F | Resp 16 | Ht 72.0 in | Wt 225.6 lb

## 2016-07-31 DIAGNOSIS — L03115 Cellulitis of right lower limb: Secondary | ICD-10-CM

## 2016-07-31 DIAGNOSIS — R600 Localized edema: Secondary | ICD-10-CM

## 2016-07-31 DIAGNOSIS — I872 Venous insufficiency (chronic) (peripheral): Secondary | ICD-10-CM

## 2016-07-31 MED ORDER — DOXYCYCLINE HYCLATE 100 MG PO TABS: 100.0000 mg | ORAL_TABLET | Freq: Two times a day (BID) | ORAL | 0 refills | Status: DC

## 2016-07-31 MED ORDER — CEFTRIAXONE SODIUM 1 G IJ SOLR
1.0000 g | Freq: Once | INTRAMUSCULAR | Status: AC
  Administered 2016-07-31: 1 g via INTRAMUSCULAR

## 2016-07-31 MED ORDER — FUROSEMIDE 20 MG PO TABS: 20.0000 mg | ORAL_TABLET | Freq: Every day | ORAL | 2 refills | Status: DC | PRN

## 2016-07-31 NOTE — Progress Notes (Signed)
Subjective:    Patient ID: Randy [name], male    DOB: 1934-02-09, 82 y.o.   MRN: [id number]  Randy [name] is a 82 y.o. male presenting on 07/31/2016 for Ankle Pain (Right side swollen  pain redness onset week)   HPI   Right Lower Extremity Swelling / Stasis Dermatitis vs Cellulitis: - Patient has a known chronic course of b/l LE edema venous stasis, had been managed on lasix PRN for symptoms - Review of recent course for same problem initially seen at [facility] on 7/17 by [name], AGPCNP-BC for RLE acute swelling within 24 hour onset following long travel / sedentary and considered to be high risk for acute DVT Well's score was calculated at that time of 4. He was treated empirically with Eliquis and then ordered LE Doppler US, however his symptoms worsened within 48 hours, and had not had US performed yet, he presented to [facility] ED on 7/19, worsening symptoms, bilateral US doppler performed and NEGATIVE for DVT, it was considered that the redness and pain may be due to cellulitis by ED and he was started on antibiotics with Keflex 500mg  BID x 7 days, also for swelling his Lasix was doubled to 40mg  daily for 3 days. He followed up with [name], AGPCNP-BC back here at [facility] following day on 07/26/16, no significant change within first 24 hours, he was continued on current treatment plan, and advised to return within 1 week if not improved. - Today he has returned for re-eval. He describes RLE pain and swelling seem worse. He is unsure if redness is improved or not. He has 1 more day of Keflex. He finished Lasix double dose for 3 days, and is unsure if swelling was improved during those 3 days, now he is taking Lasix 20mg  daily. He is voiding well. - Admits had fevers/chills/sweats, seems last episode last night, none today, he is taking Tylenol PRN for fever, took some today - Tried elevation only minimal not above heart level. Has not tried wrapping or compression - He has never seen Vascular  Surgery or any specialist for his edema - Admits had mild episode of inc bruising after taking Eliquis temporarily, has stopped this now - Denies any chest pain, dyspnea, hemoptysis, palpitations, other extremity or leg swelling or pain, drainage of pus or ulceration  Social History  Substance Use Topics  . Smoking status: Former Smoker    Packs/day: 1.00    Years: 12.00    Types: Cigarettes    Quit date: 1968  . Smokeless tobacco: Former Systems developer  . Alcohol use No    Review of Systems Per HPI unless specifically indicated above     Objective:    BP 122/72   Pulse 64   Temp 97.7 F (36.5 C) (Oral)   Resp 16   Ht 6' (1.829 m)   Wt 225 lb 9.6 oz (102.3 kg)   BMI 30.60 kg/m   Wt Readings from Last 3 Encounters:  07/31/16 225 lb 9.6 oz (102.3 kg)  07/26/16 225 lb (102.1 kg)  07/25/16 220 lb (99.8 kg)    Physical Exam  Constitutional: He is oriented to person, place, and time. He appears well-developed and well-nourished. No distress.  Well-appearing, comfortable, cooperative  HENT:  Head: Normocephalic and atraumatic.  Mouth/Throat: Oropharynx is clear and moist.  Eyes: Conjunctivae are normal. Right eye exhibits no discharge. Left eye exhibits no discharge.  Cardiovascular: Normal rate, regular rhythm, normal heart sounds and intact distal pulses.   No murmur  heard. Pulmonary/Chest: Effort normal.  Musculoskeletal: He exhibits edema (Significantly worse RLE pitting edema below knee entire lower leg into foot) and tenderness (RLE worse over lower leg and medial aspect over erythema).  Neurological: He is alert and oriented to person, place, and time.  Skin: Skin is warm and dry. No rash noted. He is not diaphoretic. There is erythema (RLE circumferential lower leg medial > lateral, localized 2 cm area of slight ecchymosis with tenderness).  Leg circumferences: Today 07/31/16: Right 43 cm / Left 37 cm (compared to 7/17 (Right leg was also 43 cm = 17 inches)  Psychiatric: He  has a normal mood and affect. His behavior is normal.  Well groomed, good eye contact, normal speech and thoughts  Nursing note and vitals reviewed.    Right lower extremity      Results for orders placed or performed during the hospital encounter of 07/25/16  CBC  Result Value Ref Range   WBC 6.2 3.8 - 10.6 K/uL   RBC 4.38 (L) 4.40 - 5.90 MIL/uL   Hemoglobin 13.2 13.0 - 18.0 g/dL   HCT 38.4 (L) 40.0 - 52.0 %   MCV 87.7 80.0 - 100.0 fL   MCH 30.1 26.0 - 34.0 pg   MCHC 34.3 32.0 - 36.0 g/dL   RDW 13.8 11.5 - 14.5 %   Platelets 210 150 - 440 K/uL  Basic metabolic panel  Result Value Ref Range   Sodium 140 135 - 145 mmol/L   Potassium 3.6 3.5 - 5.1 mmol/L   Chloride 103 101 - 111 mmol/L   CO2 28 22 - 32 mmol/L   Glucose, Bld 154 (H) 65 - 99 mg/dL   BUN 20 6 - 20 mg/dL   Creatinine, Ser 0.91 0.61 - 1.24 mg/dL   Calcium 8.7 (L) 8.9 - 10.3 mg/dL   GFR calc non Af Amer >60 >60 mL/min   GFR calc Af Amer >60 >60 mL/min   Anion gap 9 5 - 15      Assessment & Plan:   Problem List Items Addressed This Visit    Bilateral lower extremity edema    Chronic problem Now RLE acute on chronic swelling See A&P      Relevant Medications   furosemide (LASIX) 20 MG tablet   Other Relevant Orders   Ambulatory referral to Vascular Surgery    Other Visit Diagnoses    Cellulitis of right lower extremity    -  Primary   Relevant Medications   cefTRIAXone (ROCEPHIN) injection 1 g (Completed)   doxycycline (VIBRA-TABS) 100 MG tablet   Other Relevant Orders   AMB referral to wound care center   Acute stasis dermatitis       Relevant Medications   cefTRIAXone (ROCEPHIN) injection 1 g (Completed)   doxycycline (VIBRA-TABS) 100 MG tablet   Other Relevant Orders   Ambulatory referral to Vascular Surgery   AMB referral to wound care center  Worsening RLE acute on chronic RLE edema with concern now for more stasis dermatitis vs infection, uncertain if clearly cellulitis (some reported  fevers now improved), given minimal response to Keflex, however may be due to degree of swelling limited penetration of oral antibiotics. No significant extension of erythema. - Recent work-up ED 7/19 b/l doppler US NEGATIVE for DVT, taken off Eliquis (on for 2-3 days) - Limited relief from inc lasix dose - Poor adherence to elevation, compression  Plan: 1. Given concern for potential infection with cellulitis vs venous stasis derm will give Ceftriaxone  IM 1g x 1 dose in office today, finish keflex x 1 dose tomorrow, and switch to Doxycyline 100mg  BID x 10 days for alternative coverage 2. Increase Lasix to 40mg  daily again for 1 week then back to 20mg  daily, new rx sent 3. Emphasized RICE therapy - use existing compression and elevate to heart level better 4. Remain off Eliquis since no DVT 5. Urgent referral placed to [facility] for potential Unna boot placement for stasis derm, however contacted them and they cannot get patient worked in, next new patient apt is in August about 1 month away, which was accepted - may benefit from more advanced edema control 6. Urgent referral to [facility] - will contact tomorrow 08/01/16 during business hours to arrange close follow-up for likely unna boot placement 7. Advised patient strict return criteria if not improving still on current treatment, if worse sign of infection may need IV antibiotics due to swelling      Meds ordered this encounter  Medications  . DISCONTD: ELIQUIS STARTER PACK 5 MG TABS tablet    Sig: TAKE 2 TABLETS BY MOUTH TWICE A DAY FOR 7 DAYS THEN TAKE 2 TABS ONCE DAILY    Refill:  0  . cefTRIAXone (ROCEPHIN) injection 1 g  . doxycycline (VIBRA-TABS) 100 MG tablet    Sig: Take 1 tablet (100 mg total) by mouth 2 (two) times daily. For 10 days. Take with full glass of water, stay upright 30 min after taking.    Dispense:  20 tablet    Refill:  0  . furosemide (LASIX) 20 MG tablet    Sig: Take 1 tablet (20 mg  total) by mouth daily as needed for edema. For 1 week take 2 pills in morning then resume normal dose.    Dispense:  45 tablet    Refill:  2    Follow up plan: Return in about 2 weeks (around 08/14/2016), or if symptoms worsen or fail to improve, for RLE pain, swelling.  [name], [facility] 07/31/2016, 7:05 PM

## 2016-07-31 NOTE — Assessment & Plan Note (Signed)
Chronic problem Now RLE acute on chronic swelling See A&P

## 2016-07-31 NOTE — Patient Instructions (Addendum)
Thank you for coming to the clinic today.   1.  Antibiotic injection today in office, Ceftriaxone due to swelling hard for antibiotic pills to work  FInish Keflex antibiotic 1 more day  Try new antibiotic Doxycycline 100mg  twice daily, take with full glass of water, sit upright 30 min after  Use RICE therapy: - R - Rest / relative rest with activity modification avoid overuse of joint - I - Ice packs (make sure you use a towel or sock / something to protect skin) - C - Compression with ACE wrap to apply pressure and reduce swelling allowing more support - E - Elevation - if significant swelling, lift leg above heart level (toes above your nose) to help reduce swelling, most helpful at night after day of being on your feet  Increase lasix furosemide to TWO pills in morning for 1 week then back to one pill a day  VASCULAR SURGERY  CALL to check on status of appointment TOMORROW if you have not heard back by today.  [facility] [place], [place] [address]  Main: [phone]  If swelling, pain, redness, fever, worse then go to Hospital ED for further evaluation and management may need IV antibiotics or other treatment  Please schedule a Follow-up Appointment to: Return in about 2 weeks (around 08/14/2016), or if symptoms worsen or fail to improve, for RLE pain, swelling.  If you have any other questions or concerns, please feel free to call the clinic or send a message through [place]. You may also schedule an earlier appointment if necessary.  Additionally, you may be receiving a survey about your experience at our clinic within a few days to 1 week by e-mail or mail. We value your feedback.  [name], DO [place]

## 2016-08-01 ENCOUNTER — Other Ambulatory Visit: Payer: Medicare Other

## 2016-08-06 ENCOUNTER — Ambulatory Visit
Admission: RE | Admit: 2016-08-06 | Discharge: 2016-08-06 | Disposition: A | Discharge status: Home / Self Care | Payer: Medicare Other | Source: Ambulatory Visit | Attending: Urology | Admitting: Urology

## 2016-08-06 DIAGNOSIS — R310 Gross hematuria: Secondary | ICD-10-CM

## 2016-08-06 DIAGNOSIS — N281 Cyst of kidney, acquired: Secondary | ICD-10-CM | POA: Diagnosis not present

## 2016-08-06 DIAGNOSIS — N323 Diverticulum of bladder: Secondary | ICD-10-CM | POA: Diagnosis not present

## 2016-08-06 MED ORDER — IOPAMIDOL (ISOVUE-300) INJECTION 61%
125.0000 mL | Freq: Once | INTRAVENOUS | Status: AC | PRN
  Administered 2016-08-06: 125 mL via INTRAVENOUS

## 2016-08-09 ENCOUNTER — Encounter: Payer: Medicare Other | Attending: Surgery | Admitting: Surgery

## 2016-08-09 DIAGNOSIS — M810 Age-related osteoporosis without current pathological fracture: Secondary | ICD-10-CM | POA: Diagnosis not present

## 2016-08-09 DIAGNOSIS — K219 Gastro-esophageal reflux disease without esophagitis: Secondary | ICD-10-CM | POA: Diagnosis not present

## 2016-08-09 DIAGNOSIS — E785 Hyperlipidemia, unspecified: Secondary | ICD-10-CM | POA: Diagnosis not present

## 2016-08-09 DIAGNOSIS — I890 Lymphedema, not elsewhere classified: Secondary | ICD-10-CM | POA: Insufficient documentation

## 2016-08-09 DIAGNOSIS — M1990 Unspecified osteoarthritis, unspecified site: Secondary | ICD-10-CM | POA: Diagnosis not present

## 2016-08-09 DIAGNOSIS — L988 Other specified disorders of the skin and subcutaneous tissue: Secondary | ICD-10-CM | POA: Diagnosis not present

## 2016-08-09 DIAGNOSIS — Z87891 Personal history of nicotine dependence: Secondary | ICD-10-CM | POA: Diagnosis not present

## 2016-08-09 DIAGNOSIS — Z79899 Other long term (current) drug therapy: Secondary | ICD-10-CM | POA: Insufficient documentation

## 2016-08-09 DIAGNOSIS — L03115 Cellulitis of right lower limb: Secondary | ICD-10-CM | POA: Insufficient documentation

## 2016-08-09 DIAGNOSIS — I739 Peripheral vascular disease, unspecified: Secondary | ICD-10-CM | POA: Diagnosis not present

## 2016-08-09 DIAGNOSIS — I10 Essential (primary) hypertension: Secondary | ICD-10-CM | POA: Diagnosis not present

## 2016-08-11 NOTE — Progress Notes (Signed)
KASPAR, ALBORNOZ ([id number]) Visit Report for 08/09/2016 Chief Complaint Document Details Patient Name: Randy [name], Randy [name] 08/09/2016 10:30 Date of Service: AM Medical Record [id number] Number: Patient Account Number: [id number] Date of Birth/Sex: [birth year] (82 y.o. Male) Treating RN: Angeline Slim, Other Clinician: Primary Care Provider: Sheppard Coil Treating Kyomi Hector, Ishmael Holter, Provider/Extender: Referring Provider: Marlena Clipper in Treatment: 0 Information Obtained from: Patient Chief Complaint Patient presents to the wound care center due with non-wound condition(s), with the swelling of his right lower extremity which has been there for about 6-8 weeks Electronic Signature(s) Signed: 08/09/2016 11:33:22 AM By: Christin Fudge MD, FACS Entered By: Christin Fudge on 08/09/2016 11:33:22 Randy [name] ([id number]) -------------------------------------------------------------------------------- HPI Details Patient Name: Randy [name], Randy [name] 08/09/2016 10:30 Date of Service: AM Medical Record [id number] Number: Patient Account Number: [id number] Date of Birth/Sex: September 26, 1934 (82 y.o. Male) Treating RN: Angeline Slim, Other Clinician: Primary Care Provider: Sheppard Coil Treating Romelia Bromell, Ishmael Holter, Provider/Extender: Referring Provider: Marlena Clipper in Treatment: 0 History of Present Illness Location: right leg more swollen than the left leg Quality: Patient reports experiencing heaviness to affected area(s). Severity: Patient states wound (s) are getting better. Duration: Patient has had the wound for > 3 months prior to seeking treatment at the wound center Timing: Pain in wound is Intermittent (comes and goes Context: The wound would happen gradually Modifying Factors: Other treatment(s) tried include:DVT workup and oral antibiotics Associated Signs and Symptoms: Patient reports having increase swelling. HPI Description: 82 year old gentleman who was recently  referred to [place] by his PCP Dr. [name], a complex history which possibly could be related to cellulitis and stasis dermatitis with unilateral swelling of his right lower extremity. He has initially been seen and given oral antibiotics for possible cellulitis and also worked up for a DVT in the ER which was found to be negative. His liquids which was started empirically was then stopped and he has been seen at various stages and put on oral antibiotics and Lasix. On the day he was last seen by his PCP he was evaluated and referred to the vascular surgeons and also to the wound care physicians. he was given injectable Rocephin and put on doxycycline and unfortunately the vascular surgery appointment was for longer than a month and hence he was referred to the wound care for possible compression. past medical history of hypertension, GERD, osteoarthritis, a pH with urinary problems and bilateral lower extremity lymphedema. He has never had a Doppler study done in the past and quit smoking several years ago. Electronic Signature(s) Signed: 08/09/2016 11:38:07 AM By: Christin Fudge MD, FACS Entered By: Christin Fudge on 08/09/2016 11:38:07 Randy [name] ([id number]) -------------------------------------------------------------------------------- Physical Exam Details Patient Name: Randy [name], Randy [name] 08/09/2016 10:30 Date of Service: AM Medical Record [id number] Number: Patient Account Number: [id number] Date of Birth/Sex: May 26, 1934 (82 y.o. Male) Treating RN: Angeline Slim, Other Clinician: Primary Care Provider: Sheppard Coil Treating Belen Zwahlen, Ishmael Holter, Provider/Extender: Referring Provider: Marlena Clipper in Treatment: 0 Constitutional . Pulse regular. Respirations normal and unlabored. Afebrile. . Eyes Nonicteric. Reactive to light. Ears, Nose, Mouth, and Throat Lips, teeth, and gums WNL.[name] Moist mucosa without lesions. Neck supple and nontender. No palpable  supraclavicular or cervical adenopathy. Normal sized without goiter. Respiratory WNL. No retractions.. Cardiovascular Pedal Pulses WNL. ABIs were noncompressible. No clubbing, cyanosis right lower extremity has stage II lymphedema. Gastrointestinal (GI) Abdomen without masses or tenderness.. No liver or spleen enlargement or tenderness.. Lymphatic No adneopathy. No adenopathy. No adenopathy. Musculoskeletal Adexa without tenderness or enlargement.. Digits and nails w/o clubbing, cyanosis, infection,  petechiae, ischemia, or inflammatory conditions.. Integumentary (Hair, Skin) No suspicious lesions. No crepitus or fluctuance. No peri-wound warmth or erythema. No masses.[name] Psychiatric Judgement and insight Intact.. No evidence of depression, anxiety, or agitation.. Notes Full palpitations of his groins revealed no lymphadenopathy or masses. He has got significant lymphedema the right calf is 5 cm larger in diameter as the compared to the left. There are no open wounds but there is a hint of a hematoma or residual inflammation on the medial part of his right calf in the lower third. Electronic Signature(s) Signed: 08/09/2016 11:47:30 AM By: Christin Fudge MD, FACS Entered By: Christin Fudge on 08/09/2016 11:47:29 Randy [name] ([id number]) Randy [name] ([id number]) -------------------------------------------------------------------------------- Physician Orders Details Patient Name: Randy [name], Randy [name] 08/09/2016 10:30 Date of Service: AM Medical Record [id number] Number: Patient Account Number: [id number] Date of Birth/Sex: November 17, 1934 (82 y.o. Male) Treating RN: Angeline Slim, Other Clinician: Primary Care Provider: Sheppard Coil Treating Zarin Hagmann, Ishmael Holter, Provider/Extender: Referring Provider: Marlena Clipper in Treatment: 0 Verbal / Phone Orders: Yes Clinician: Carolyne Fiscal, Debi Read Back and Verified: Yes Diagnosis Coding Edema Control o Elevate legs to the level of the  heart and pump ankles as often as possible o Other: - elevate legs while sleeping wear light compression stockings Services and Therapies o Arterial Studies- Bilateral o Venous Studies -Bilateral Electronic Signature(s) Signed: 08/09/2016 4:02:51 PM By: Christin Fudge MD, FACS Signed: 08/09/2016 4:34:22 PM By: Alric Quan Entered By: Alric Quan on 08/09/2016 11:32:27 JAYLEN, KNOPE ([id number]) -------------------------------------------------------------------------------- Problem List Details Patient Name: Randy [name], Randy [name] 08/09/2016 10:30 Date of Service: AM Medical Record [id number] Number: Patient Account Number: [id number] Date of Birth/Sex: 07/01/1934 (82 y.o. Male) Treating RN: Angeline Slim, Other Clinician: Primary Care Provider: Sheppard Coil Treating Dynasia Kercheval, Ishmael Holter, Provider/Extender: Referring Provider: Marlena Clipper in Treatment: 0 Active Problems ICD-10 Encounter Code Description Active Date Diagnosis I89.0 Lymphedema, not elsewhere classified 08/09/2016 Yes L03.115 Cellulitis of right lower limb 08/09/2016 Yes I73.9 Peripheral vascular disease, unspecified 08/09/2016 Yes Inactive Problems Resolved Problems Electronic Signature(s) Signed: 08/09/2016 11:32:53 AM By: Christin Fudge MD, FACS Entered By: Christin Fudge on 08/09/2016 11:32:53 Randy [name] ([id number]) -------------------------------------------------------------------------------- Progress Note Details Patient Name: Randy [name], Randy [name] 08/09/2016 10:30 Date of Service: AM Medical Record [id number] Number: Patient Account Number: [id number] Date of Birth/Sex: 03/03/1934 (82 y.o. Male) Treating RN: Angeline Slim, Other Clinician: Primary Care Provider: Sheppard Coil Treating Cordarrius Coad, Ishmael Holter, Provider/Extender: Referring Provider: Marlena Clipper in Treatment: 0 Subjective Chief Complaint Information obtained from Patient Patient presents to the wound care  center due with non-wound condition(s), with the swelling of his right lower extremity which has been there for about 6-8 weeks History of Present Illness (HPI) The following HPI elements were documented for the patient's wound: Location: right leg more swollen than the left leg Quality: Patient reports experiencing heaviness to affected area(s). Severity: Patient states wound (s) are getting better. Duration: Patient has had the wound for > 3 months prior to seeking treatment at the wound center Timing: Pain in wound is Intermittent (comes and goes Context: The wound would happen gradually Modifying Factors: Other treatment(s) tried include:DVT workup and oral antibiotics Associated Signs and Symptoms: Patient reports having increase swelling. 82 year old gentleman who was recently referred to [place] by his PCP Dr. [name], a complex history which possibly could be related to cellulitis and stasis dermatitis with unilateral swelling of his right lower extremity. He has initially been seen and given oral antibiotics for possible cellulitis and also worked up for a DVT in the ER which was found  to be negative. His liquids which was started empirically was then stopped and he has been seen at various stages and put on oral antibiotics and Lasix. On the day he was last seen by his PCP he was evaluated and referred to the vascular surgeons and also to the wound care physicians. he was given injectable Rocephin and put on doxycycline and unfortunately the vascular surgery appointment was for longer than a month and hence he was referred to the wound care for possible compression. past medical history of hypertension, GERD, osteoarthritis, a pH with urinary problems and bilateral lower extremity lymphedema. He has never had a Doppler study done in the past and quit smoking several years ago. Wound History Patient reportedly has not tested positive for osteomyelitis. Patient reportedly  has not had testing performed to evaluate circulation in the legs. Patient History Randy [name], Randy [name] ([id number]) Information obtained from Patient. Allergies antihistamines Family History Cancer - Mother, Father, Siblings, Heart Disease - Siblings. Social History Never smoker - 50 yrs ago quit, Marital Status - Married, Alcohol Use - Never, Drug Use - No History, Caffeine Use - Rarely. Medical History Eyes Patient has history of Cataracts - surgery Cardiovascular Patient has history of Hypertension Musculoskeletal Patient has history of Osteoarthritis Review of Systems (ROS) Constitutional Symptoms ([place]) The patient has no complaints or symptoms. Ear/Nose/Mouth/Throat The patient has no complaints or symptoms. Hematologic/Lymphatic The patient has no complaints or symptoms. Respiratory The patient has no complaints or symptoms. Cardiovascular hyperlipidemia Gastrointestinal gerd Genitourinary BPH Musculoskeletal osteoporosis Medications enalapril maleate 10 mg tablet oral tablet oral Calcium Citrate + D 315 mg-200 unit tablet oral tablet oral coenzyme Q10 100 mg capsule oral capsule oral omega-3 fatty acids-fish oil 360 mg-1,200 mg capsule oral capsule oral furosemide 20 mg tablet oral tablet oral Randy [name], Randy [name] ([id number]) potassium chloride ER 10 mEq tablet,extended release oral tablet extended release oral omeprazole 20 mg capsule,delayed release oral capsule,delayed release(DR/EC) oral doxycycline hyclate 100 mg tablet oral tablet oral Objective Constitutional Pulse regular. Respirations normal and unlabored. Afebrile. Vitals Time Taken: 10:40 AM, Height: 72 in, Source: Stated, Weight: 266.6 lbs, Source: Measured, BMI: 36.2, Temperature: 98.1 F, Pulse: 61 bpm, Respiratory Rate: 18 breaths/min, Blood Pressure: 142/78 mmHg. Eyes Nonicteric. Reactive to light. Ears, Nose, Mouth, and Throat Lips, teeth, and gums WNL.[name] Moist mucosa without  lesions. Neck supple and nontender. No palpable supraclavicular or cervical adenopathy. Normal sized without goiter. Respiratory WNL. No retractions.. Cardiovascular Pedal Pulses WNL. ABIs were noncompressible. No clubbing, cyanosis right lower extremity has stage II lymphedema. Gastrointestinal (GI) Abdomen without masses or tenderness.. No liver or spleen enlargement or tenderness.. Lymphatic No adneopathy. No adenopathy. No adenopathy. Musculoskeletal Adexa without tenderness or enlargement.. Digits and nails w/o clubbing, cyanosis, infection, petechiae, ischemia, or inflammatory conditions.[name] Psychiatric Judgement and insight Intact.. No evidence of depression, anxiety, or agitation.. General Notes: Full palpitations of his groins revealed no lymphadenopathy or masses. He has got significant lymphedema the right calf is 5 cm larger in diameter as the compared to the left. There are no Randy [name], Randy [name] ([id number]) open wounds but there is a hint of a hematoma or residual inflammation on the medial part of his right calf in the lower third. Integumentary (Hair, Skin) No suspicious lesions. No crepitus or fluctuance. No peri-wound warmth or erythema. No masses.. Other Condition(s) Patient presents with Other Dermatologic Condition located on the Right Leg. The skin appearance exhibited: Rubor. Skin temperature was noted as No Abnormality. Assessment Active Problems ICD-10 I89.0 - Lymphedema, not elsewhere classified  L03.115 - Cellulitis of right lower limb I73.9 - Peripheral vascular disease, unspecified this 82 year old gentleman who has right unilateral lymphedema with previous cellulitis now has been ruled out for a DVT and is on doxycycline orally. He has a vascular appointment pending in about a month's time and in the meanwhile I have recommended: 1. Elevation and exercise, and mild compression with a 20-30 mm compression stocking 2. His ABIs were noncompressible and hence I  recommended arterial Doppler studies 3. He will need a venous reflux study of both lower extremities to look for significant venous hypertension or varicose veins 4. Appropriate treatment with his diuretics as per his PCP I have answered all his questions and he will see [place] back regularly to monitor him until he sees the vascular surgeons Plan Edema Control: Elevate legs to the level of the heart and pump ankles as often as possible Other: - elevate legs while sleeping wear light compression stockings Services and Therapies ordered were: Arterial Studies- Bilateral, Venous Studies -Bilateral Randy [name], Randy [name] ([id number]) this 82 year old gentleman who has right unilateral lymphedema with previous cellulitis now has been ruled out for a DVT and is on doxycycline orally. He has a vascular appointment pending in about a month's time and in the meanwhile I have recommended: 1. Elevation and exercise, and mild compression with a 20-30 mm compression stocking 2. His ABIs were noncompressible and hence I recommended arterial Doppler studies 3. He will need a venous reflux study of both lower extremities to look for significant venous hypertension or varicose veins 4. Appropriate treatment with his diuretics as per his PCP I have answered all his questions and he will see [place] back regularly to monitor him until he sees the vascular surgeons Electronic Signature(s) Signed: 08/09/2016 11:49:30 AM By: Christin Fudge MD, FACS Entered By: Christin Fudge on 08/09/2016 11:49:30 Randy [name] ([id number]) -------------------------------------------------------------------------------- ROS/PFSH Details Patient Name: Randy [name], Randy [name] 08/09/2016 10:30 Date of Service: AM Medical Record [id number] Number: Patient Account Number: [id number] Date of Birth/Sex: [birth year] (82 y.o. Male) Treating RN: Angeline Slim, Other Clinician: Primary Care Provider: Sheppard Coil Treating Jayni Prescher, Ishmael Holter,  Provider/Extender: Referring Provider: Marlena Clipper in Treatment: 0 Information Obtained From Patient Wound History Do you currently have one or more open woundso No Have you tested positive for osteomyelitis (bone infection)o No Have you had any tests for circulation on your legso No Constitutional Symptoms (General Health) Complaints and Symptoms: No Complaints or Symptoms Eyes Medical History: Positive for: Cataracts - surgery Ear/Nose/Mouth/Throat Complaints and Symptoms: No Complaints or Symptoms Hematologic/Lymphatic Complaints and Symptoms: No Complaints or Symptoms Respiratory Complaints and Symptoms: No Complaints or Symptoms Cardiovascular Complaints and Symptoms: Review of System Notes: hyperlipidemia Medical HistoryVINTON, Randy [name] ([id number]) Positive for: Hypertension Gastrointestinal Complaints and Symptoms: Review of System Notes: gerd Genitourinary Complaints and Symptoms: Review of System Notes: BPH Musculoskeletal Complaints and Symptoms: Review of System Notes: osteoporosis Medical History: Positive for: Osteoarthritis HBO Extended History Items Eyes: Cataracts Immunizations Pneumococcal Vaccine: Received Pneumococcal Vaccination: No Family and Social History Cancer: Yes - Mother, Father, Siblings; Heart Disease: Yes - Siblings; Never smoker - 50 yrs ago quit; Marital Status - Married; Alcohol Use: Never; Drug Use: No History; Caffeine Use: Rarely; Financial Concerns: No; Food, Clothing or Shelter Needs: No; Transportation Concerns: No; Advanced Directives: No; Patient does not want information on Advanced Directives; Do not resuscitate: No; Living Will: Yes (Not Provided); Medical Power of Attorney: Yes - Renato Gails (Not Provided) Physician Affirmation I have reviewed and agree with the above information. Electronic  Signature(s) Signed: 08/09/2016 4:02:51 PM By: Christin Fudge MD, FACS Signed: 08/09/2016 4:34:22 PM By: Alric Quan Entered By: Christin Fudge on 08/09/2016 11:03:54 TREYLAN, MCCLINTOCK ([id number]) -------------------------------------------------------------------------------- SuperBill Details Patient Name: Randy [name] Date of Service: 08/09/2016 Medical Record Number: [id number] Patient Account Number: [id number] Date of Birth/Sex: 25-Jun-1934 (82 y.o. Male) Treating RN: Carolyne Fiscal, Debi Primary Care Provider: [name] Other Clinician: Referring Provider: [name] Treating Provider/Extender: Frann Rider in Treatment: 0 Diagnosis Coding ICD-10 Codes Code Description I89.0 Lymphedema, not elsewhere classified L03.115 Cellulitis of right lower limb I73.9 Peripheral vascular disease, unspecified Facility Procedures CPT4 Code: [id number] Description: 99214 - WOUND CARE VISIT-LEV 4 EST PT Modifier: Quantity: 1 Physician Procedures CPT4 Code: 7616073 Description: 71062 - WC PHYS LEVEL 4 - NEW PT ICD-10 Description Diagnosis I89.0 Lymphedema, not elsewhere classified L03.115 Cellulitis of right lower limb I73.9 Peripheral vascular disease, unspecified Modifier: Quantity: 1 Electronic Signature(s) Signed: 08/09/2016 4:02:51 PM By: Christin Fudge MD, FACS Signed: 08/09/2016 4:34:22 PM By: Alric Quan Previous Signature: 08/09/2016 11:50:13 AM Version By: Christin Fudge MD, FACS Entered By: Alric Quan on 08/09/2016 11:54:03

## 2016-08-11 NOTE — Progress Notes (Signed)
Randy, [name] ([id number]) Visit Report for 08/09/2016 Abuse/Suicide Risk Screen Details Patient Name: Randy [name], Randy [name] 08/09/2016 10:30 Date of Service: AM Medical Record [id number] Number: Patient Account Number: [id number] Date of Birth/Sex: 1934-09-09 (82 y.o. Male) Treating RN: Angeline Slim, Other Clinician: Primary Care Rahmel Nedved: Sheppard Coil Treating Britto, Ishmael Holter, Diane Mochizuki/Extender: Referring Gerhart Ruggieri: Marlena Clipper in Treatment: 0 Abuse/Suicide Risk Screen Items Answer ABUSE/SUICIDE RISK SCREEN: Has anyone close to you tried to hurt or harm you recentlyo No Do you feel uncomfortable with anyone in your familyo No Has anyone forced you do things that you didnot want to doo No Do you have any thoughts of harming yourselfo No Patient displays signs or symptoms of abuse and/or neglect. No Electronic Signature(s) Signed: 08/09/2016 4:34:22 PM By: Alric Quan Entered By: Alric Quan on 08/09/2016 10:48:44 [name] ([id number]) -------------------------------------------------------------------------------- Activities of Daily Living Details Patient Name: Randy, [name] 08/09/2016 10:30 Date of Service: AM Medical Record [id number] Number: Patient Account Number: [id number] Date of Birth/Sex: 08/24/1934 (82 y.o. Male) Treating RN: Angeline Slim, Other Clinician: Primary Care [name] Gervasi: Sheppard Coil Treating Britto, Ishmael Holter, Om Lizotte/Extender: Referring Cerissa Zeiger: Marlena Clipper in Treatment: 0 Activities of Daily Living Items Answer Activities of Daily Living (Please select one for each item) Drive Automobile Completely Able Take Medications Completely Able Use Telephone Completely Able Care for Appearance Completely Able Use Toilet Completely Able Bath / Shower Completely Able Dress Self Completely Able Feed Self Completely Able Walk Completely Able Get In / Out Bed Completely Able Housework Completely Able Prepare  Meals Completely [place] for Self Completely Able Electronic Signature(s) Signed: 08/09/2016 4:34:22 PM By: Alric Quan Entered By: Alric Quan on 08/09/2016 10:49:00 [name] ([id number]) -------------------------------------------------------------------------------- Education Assessment Details Patient Name: Randy, [name] 08/09/2016 10:30 Date of Service: AM Medical Record [id number] Number: Patient Account Number: [id number] Date of Birth/Sex: July 01, 1934 (82 y.o. Male) Treating RN: Angeline Slim, Other Clinician: Primary Care Maryanna Stuber: Sheppard Coil Treating Britto, Ishmael Holter, Ricco Dershem/Extender: Referring Catharina Pica: Marlena Clipper in Treatment: 0 Primary Learner Assessed: Patient Learning Preferences/Education Level/Primary Language Learning Preference: Explanation, Printed Material Highest Education Level: College or Above Preferred Language: English Cognitive Barrier Assessment/Beliefs Language Barrier: No Translator Needed: No Memory Deficit: No Emotional Barrier: No Cultural/Religious Beliefs Affecting Medical No Care: Physical Barrier Assessment Impaired Vision: No Impaired Hearing: No Decreased Hand dexterity: No Knowledge/Comprehension Assessment Knowledge Level: Medium Comprehension Level: Medium Ability to understand written Medium instructions: Ability to understand verbal Medium instructions: Motivation Assessment Anxiety Level: Calm Cooperation: Cooperative Education Importance: Acknowledges Need Interest in Health Problems: Asks Questions Perception: Coherent Willingness to Engage in Self- Medium Management Activities: ANUBIS, FUNDORA ([id number]) Readiness to Engage in Self- Management Activities: Electronic Signature(s) Signed: 08/09/2016 4:34:22 PM By: Alric Quan Entered By: Alric Quan on 08/09/2016 10:49:19 VIGGO, PERKO  ([id number]) -------------------------------------------------------------------------------- Fall Risk Assessment Details Patient Name: Randy, [name] 08/09/2016 10:30 Date of Service: AM Medical Record [id number] Number: Patient Account Number: [id number] Date of Birth/Sex: February 16, 1934 (82 y.o. Male) Treating RN: Angeline Slim, Other Clinician: Primary Care Geronimo Diliberto: Sheppard Coil Treating Britto, Ishmael Holter, Sheily Lineman/Extender: Referring Makyle Eslick: Marlena Clipper in Treatment: 0 Fall Risk Assessment Items Have you had 2 or more falls in the last 12 monthso 0 No Have you had any fall that resulted in injury in the last 12 monthso 0 No FALL RISK ASSESSMENT: History of falling - immediate or within 3 months 0 No Secondary diagnosis 15 Yes Ambulatory aid None/bed rest/wheelchair/nurse 0 No Crutches/cane/walker 0 No Furniture 0 No IV Access/Saline Lock 0 No Gait/Training  Normal/bed rest/immobile 0 No Weak 0 No Impaired 0 No Mental Status Oriented to own ability 0 Yes Electronic Signature(s) Signed: 08/09/2016 4:34:22 PM By: Alric Quan Entered By: Alric Quan on 08/09/2016 10:49:36 [name] ([id number]) -------------------------------------------------------------------------------- Foot Assessment Details Patient Name: Randy, [name] 08/09/2016 10:30 Date of Service: AM Medical Record [id number] Number: Patient Account Number: [id number] Date of Birth/Sex: 20-Jan-1934 (82 y.o. Male) Treating RN: Angeline Slim, Other Clinician: Primary Care Bonniejean Piano: Sheppard Coil Treating Britto, Ishmael Holter, Deicy [place]/Extender: Referring Aliviana Burdell: Marlena Clipper in Treatment: 0 Foot Assessment Items Site Locations + = Sensation present, - = Sensation absent, C = Callus, U = Ulcer R = Redness, W = Warmth, M = Maceration, PU = Pre-ulcerative lesion F = Fissure, S = Swelling, D = Dryness Assessment Right: Left: Other Deformity: No No Prior Foot  Ulcer: No No Prior Amputation: No No Charcot Joint: No No Ambulatory Status: Ambulatory With Help Assistance Device: Cane Gait: Steady Electronic Signature(s) Signed: 08/09/2016 4:34:22 PM By: Ebbie Ridge, Jeneen Rinks ([id number]) Entered By: Alric Quan on 08/09/2016 10:51:37 [name] ([id number]) -------------------------------------------------------------------------------- Nutrition Risk Assessment Details Patient Name: JANZEN, SACKS 08/09/2016 10:30 Date of Service: AM Medical Record [id number] Number: Patient Account Number: [id number] Date of Birth/Sex: 07/06/1934 (82 y.o. Male) Treating RN: Angeline Slim, Other Clinician: Primary Care Jazae Gandolfi: Sheppard Coil Treating Britto, Ishmael Holter, Saloni Lablanc/Extender: Referring Kaianna Dolezal: Marlena Clipper in Treatment: 0 Height (in): 72 Weight (lbs): 266.6 Body Mass Index (BMI): 36.2 Nutrition Risk Assessment Items NUTRITION RISK SCREEN: I have an illness or condition that made me change the kind and/or 0 No amount of food I eat I eat fewer than two meals per day 0 No I eat few fruits and vegetables, or milk products 0 No I have three or more drinks of beer, liquor or wine almost every day 0 No I have tooth or mouth problems that make it hard for me to eat 0 No I don't always have enough money to buy the food I need 0 No I eat alone most of the time 0 No I take three or more different prescribed or over-the-counter drugs a 1 Yes day Without wanting to, I have lost or gained 10 pounds in the last six 0 No months I am not always physically able to shop, cook and/or feed myself 0 No Nutrition Protocols Good Risk Protocol Moderate Risk Protocol Electronic Signature(s) Signed: 08/09/2016 4:34:22 PM By: Alric Quan Entered By: Alric Quan on 08/09/2016 10:49:42

## 2016-08-11 NOTE — Progress Notes (Signed)
LEIGH, KAEDING ([id number]) Visit Report for 08/09/2016 Allergy List Details Patient Name: Randy [name], Randy [name] 08/09/2016 10:30 Date of Service: AM Medical Record [id number] Number: Patient Account Number: [id number] Date of Birth/Sex: 1934/08/11 (82 y.o. Male) Treating RN: Angeline Slim, Other Clinician: Primary Care Jacqui Headen: Sheppard Coil Treating Britto, Ishmael Holter, Ashaad Gaertner/Extender: Referring Hamzah Savoca: Marlena Clipper in Treatment: 0 Allergies Active Allergies antihistamines Allergy Notes Electronic Signature(s) Signed: 08/09/2016 4:34:22 PM By: Alric Quan Entered By: Alric Quan on 08/09/2016 10:45:50 [name] ([id number]) -------------------------------------------------------------------------------- Arrival Information Details Patient Name: Randy [name] 08/09/2016 10:30 Date of Service: AM Medical Record [id number] Number: Patient Account Number: [id number] Date of Birth/Sex: Sep 27, 1934 (82 y.o. Male) Treating RN: Angeline Slim, Other Clinician: Primary Care Koen Antilla: Sheppard Coil Treating Britto, Ishmael Holter, Zan Triska/Extender: Referring Ruchi Stoney: Marlena Clipper in Treatment: 0 Visit Information Patient Arrived: Cane Arrival Time: 10:38 Accompanied By: self Transfer Assistance: None Patient Identification Verified: Yes Secondary Verification Process Yes Completed: Patient Requires Transmission- No Based Precautions: Patient Has Alerts: Yes Patient Alerts: R ABI non- compressible L ABI non- compressible Electronic Signature(s) Signed: 08/09/2016 4:34:22 PM By: Alric Quan Entered By: Alric Quan on 08/09/2016 10:57:42 [name] ([id number]) -------------------------------------------------------------------------------- Clinic Level of Care Assessment Details Patient Name: Randy [name] 08/09/2016 10:30 Date of Service: AM Medical Record [id number] Number: Patient Account Number: [id number] Date of Birth/Sex:  02/20/1934 (82 y.o. Male) Treating RN: Angeline Slim, Other Clinician: Primary Care Aixa Corsello: Sheppard Coil Treating Britto, Ishmael Holter, Jaleel Allen/Extender: Referring Rorey Bisson: Marlena Clipper in Treatment: 0 Clinic Level of Care Assessment Items TOOL 2 Quantity Score X - Use when only an EandM is performed on the INITIAL visit 1 0 ASSESSMENTS - Nursing Assessment / Reassessment X - General Physical Exam (combine w/ comprehensive assessment (listed just 1 20 below) when performed on new pt. evals) X - Comprehensive Assessment (HX, ROS, Risk Assessments, Wounds Hx, etc.) 1 25 ASSESSMENTS - Wound and Skin Assessment / Reassessment []  - Simple Wound Assessment / Reassessment - one wound 0 []  - Complex Wound Assessment / Reassessment - multiple wounds 0 []  - Dermatologic / Skin Assessment (not related to wound area) 0 ASSESSMENTS - Ostomy and/or Continence Assessment and Care []  - Incontinence Assessment and Management 0 []  - Ostomy Care Assessment and Management (repouching, etc.) 0 PROCESS - Coordination of Care []  - Simple Patient / Family Education for ongoing care 0 X - Complex (extensive) Patient / Family Education for ongoing care 1 20 X - Staff obtains Programmer, systems, Records, Test Results / Process Orders 1 10 []  - Staff telephones HHA, Nursing Homes / Clarify orders / etc 0 []  - Routine Transfer to another Facility (non-emergent condition) 0 []  - Routine Hospital Admission (non-emergent condition) 0 X - New Admissions / Biomedical engineer / Ordering NPWT, Apligraf, etc. 1 15 Lenger, Solon ([id number]) []  - Emergency Hospital Admission (emergent condition) 0 X - Simple Discharge Coordination 1 10 []  - Complex (extensive) Discharge Coordination 0 PROCESS - Special Needs []  - Pediatric / Minor Patient Management 0 []  - Isolation Patient Management 0 []  - Hearing / Language / Visual special needs 0 []  - Assessment of Community assistance (transportation, D/C  planning, etc.) 0 []  - Additional assistance / Altered mentation 0 []  - Support Surface(s) Assessment (bed, cushion, seat, etc.) 0 INTERVENTIONS - Wound Cleansing / Measurement X - Wound Imaging (photographs - any number of wounds) 1 5 []  - Wound Tracing (instead of photographs) 0 []  - Simple Wound Measurement - one wound 0 []  - Complex Wound Measurement - multiple wounds 0 []  -  Simple Wound Cleansing - one wound 0 []  - Complex Wound Cleansing - multiple wounds 0 INTERVENTIONS - Wound Dressings []  - Small Wound Dressing one or multiple wounds 0 []  - Medium Wound Dressing one or multiple wounds 0 []  - Large Wound Dressing one or multiple wounds 0 []  - Application of Medications - injection 0 INTERVENTIONS - Miscellaneous []  - External ear exam 0 []  - Specimen Collection (cultures, biopsies, blood, body fluids, etc.) 0 []  - Specimen(s) / Culture(s) sent or taken to Lab for analysis 0 []  - Patient Transfer (multiple staff / Harrel Lemon Lift / Similar devices) 0 LAWTON, DOLLINGER ([id number]) []  - Simple Staple / Suture removal (25 or less) 0 []  - Complex Staple / Suture removal (26 or more) 0 []  - Hypo / Hyperglycemic Management (close monitor of Blood Glucose) 0 X - Ankle / Brachial Index (ABI) - do not check if billed separately 1 15 Has the patient been seen at the hospital within the last three years: Yes Total Score: 120 Level Of Care: New/Established - Level 4 Electronic Signature(s) Signed: 08/09/2016 4:34:22 PM By: Alric Quan Entered By: Alric Quan on 08/09/2016 11:53:55 [name] ([id number]) -------------------------------------------------------------------------------- Encounter Discharge Information Details Patient Name: Randy [name] 08/09/2016 10:30 Date of Service: AM Medical Record [id number] Number: Patient Account Number: [id number] Date of Birth/Sex: October 06, 1934 (82 y.o. Male) Treating RN: Angeline Slim, Other Clinician: Primary Care  Merryl Buckels: Sheppard Coil Treating Britto, Ishmael Holter, Icie Kuznicki/Extender: Referring Lovada Barwick: Marlena Clipper in Treatment: 0 Encounter Discharge Information Items Discharge Pain Level: 0 Discharge Condition: Stable Ambulatory Status: Cane Discharge Destination: Home Transportation: Private Auto Accompanied By: self Schedule Follow-up Appointment: Yes Medication Reconciliation completed No and provided to Patient/Care Janyia Guion: Provided on Clinical Summary of Care: 08/09/2016 Form Type Recipient Paper Patient JB Electronic Signature(s) Signed: 08/09/2016 11:20:49 AM By: Ruthine Dose Entered By: Ruthine Dose on 08/09/2016 11:20:49 [name] ([id number]) -------------------------------------------------------------------------------- Lower Extremity Assessment Details Patient Name: ENOCH, MOFFA 08/09/2016 10:30 Date of Service: AM Medical Record [id number] Number: Patient Account Number: [id number] Date of Birth/Sex: Nov 07, 1934 (82 y.o. Male) Treating RN: Angeline Slim, Other Clinician: Primary Care Belkis Norbeck: Sheppard Coil Treating Britto, Ishmael Holter, Arynn Armand/Extender: Referring Gar Glance: Marlena Clipper in Treatment: 0 Edema Assessment Assessed: [Left: No] [Right: No] Edema: [Left: Ye] [Right: s] Calf Left: Right: Point of Measurement: 36 cm From Medial Instep 41.5 cm 46.2 cm Ankle Left: Right: Point of Measurement: 10 cm From Medial Instep 25 cm 29.1 cm Vascular Assessment Pulses: Dorsalis Pedis Palpable: [Left:Yes] [Right:Yes] Doppler Audible: [Left:Yes] [Right:Yes] Posterior Tibial Extremity colors, hair growth, and conditions: Extremity Color: [Left:Red] [Right:Red] Hair Growth on Extremity: [Right:Yes] Temperature of Extremity: [Left:Warm] [Right:Warm] Capillary Refill: [Left:< 3 seconds] [Right:< 3 seconds] Toe Nail Assessment Left: Right: Thick: No No Discolored: No No Deformed: No No Improper Length and Hygiene: No  No Notes SCHETTER, Abron ([id number]) R ABI non-compressible L ABI non-compressible Electronic Signature(s) Signed: 08/09/2016 4:34:22 PM By: Alric Quan Entered By: Alric Quan on 08/09/2016 10:58:46 [name] ([id number]) -------------------------------------------------------------------------------- Multi Wound Chart Details Patient Name: RIGLEY, NIESS 08/09/2016 10:30 Date of Service: AM Medical Record [id number] Number: Patient Account Number: [id number] Date of Birth/Sex: 05-23-1934 (82 y.o. Male) Treating RN: Angeline Slim, Other Clinician: Primary Care Kelie Gainey: Sheppard Coil Treating Britto, Ishmael Holter, Korene Dula/Extender: Referring Jarek Longton: Marlena Clipper in Treatment: 0 Vital Signs Height(in): 72 Pulse(bpm): 61 Weight(lbs): 266.6 Blood Pressure 142/78 (mmHg): Body Mass Index(BMI): 36 Temperature(F): 98.1 Respiratory Rate 18 (breaths/min): Wound Assessments Treatment Notes Electronic Signature(s) Signed: 08/09/2016 11:32:57 AM By: Christin Fudge MD, FACS Entered  By: Christin Fudge on 08/09/2016 11:32:56 [name] ([id number]) -------------------------------------------------------------------------------- Multi-Disciplinary Care Plan Details Patient Name: JULY, NICKSON 08/09/2016 10:30 Date of Service: AM Medical Record [id number] Number: Patient Account Number: [id number] Date of Birth/Sex: [birth year] (82 y.o. Male) Treating RN: Angeline Slim, Other Clinician: Primary Care Landa Mullinax: Sheppard Coil Treating Britto, Ishmael Holter, Giannah Zavadil/Extender: Referring Obed Samek: Marlena Clipper in Treatment: 0 Active Inactive ` Orientation to the Wound Care Program Nursing Diagnoses: Knowledge deficit related to the wound healing center program Goals: Patient/caregiver will verbalize understanding of the [facility] Program Date Initiated: 08/09/2016 Target Resolution Date: 09/14/2016 Goal Status:  Active Interventions: Provide education on orientation to the wound center Notes: ` Pain, Acute or Chronic Nursing Diagnoses: Pain, acute or chronic: actual or potential Potential alteration in comfort, pain Goals: Patient/caregiver will verbalize adequate pain control between visits Date Initiated: 08/09/2016 Target Resolution Date: 11/16/2016 Goal Status: Active Interventions: Complete pain assessment as per visit requirements Notes: ` Wound/Skin Impairment UTHMAN, MROCZKOWSKI ([id number]) Nursing Diagnoses: Impaired tissue integrity Knowledge deficit related to ulceration/compromised skin integrity Goals: Ulcer/skin breakdown will have a volume reduction of 80% by week 12 Date Initiated: 08/09/2016 Target Resolution Date: 12/14/2016 Goal Status: Active Interventions: Assess patient/caregiver ability to perform ulcer/skin care regimen upon admission and as needed Notes: Electronic Signature(s) Signed: 08/09/2016 4:34:22 PM By: Alric Quan Entered By: Alric Quan on 08/09/2016 11:53:18 [name] ([id number]) -------------------------------------------------------------------------------- Non-Wound Condition Assessment Details Patient Name: YARETH, MACDONNELL 08/09/2016 10:30 Date of Service: AM Medical Record [id number] Number: Patient Account Number: [id number] Date of Birth/Sex: 05/25/1934 (82 y.o. Male) Treating RN: Angeline Slim, Other Clinician: Primary Care Maimuna Leaman: Sheppard Coil Treating Britto, Ishmael Holter, Koury Roddy/Extender: Referring Treazure Nery: Marlena Clipper in Treatment: 0 Non-Wound Condition: Condition: Other Dermatologic Condition Location: Leg Side: Right Photos Periwound Skin Texture Texture Color No Abnormalities Noted: No No Abnormalities Noted: No Rubor: Yes Moisture No Abnormalities Noted: No Temperature / Pain Temperature: No Abnormality Electronic Signature(s) Signed: 08/09/2016 4:34:22 PM By: Alric Quan Entered By:  Alric Quan on 08/09/2016 11:42:06 [name] ([id number]) -------------------------------------------------------------------------------- Pain Assessment Details Patient Name: CLIMMIE, BUELOW 08/09/2016 10:30 Date of Service: AM Medical Record [id number] Number: Patient Account Number: [id number] Date of Birth/Sex: 1934/03/01 (82 y.o. Male) Treating RN: Angeline Slim, Other Clinician: Primary Care Paxton Binns: Sheppard Coil Treating Britto, Ishmael Holter, Auden Tatar/Extender: Referring Milaina Sher: Marlena Clipper in Treatment: 0 Active Problems Location of Pain Severity and Description of Pain Patient Has Paino No Site Locations With Dressing Change: No Pain Management and Medication Current Pain Management: Electronic Signature(s) Signed: 08/09/2016 4:34:22 PM By: Alric Quan Entered By: Alric Quan on 08/09/2016 10:40:17 [name] ([id number]) -------------------------------------------------------------------------------- Vitals Details Patient Name: EXAVIOR, KIMMONS 08/09/2016 10:30 Date of Service: AM Medical Record [id number] Number: Patient Account Number: [id number] Date of Birth/Sex: [birth year] (82 y.o. Male) Treating RN: Angeline Slim, Other Clinician: Primary Care Tirth Cothron: Sheppard Coil Treating Britto, Ishmael Holter, Antoney Biven/Extender: Referring Jazzalynn Rhudy: Marlena Clipper in Treatment: 0 Vital Signs Time Taken: 10:40 Temperature (F): 98.1 Height (in): 72 Pulse (bpm): 61 Source: Stated Respiratory Rate (breaths/min): 18 Weight (lbs): 266.6 Blood Pressure (mmHg): 142/78 Source: Measured Reference Range: 80 - 120 mg / dl Body Mass Index (BMI): 36.2 Electronic Signature(s) Signed: 08/09/2016 4:34:22 PM By: Alric Quan Entered By: Alric Quan on 08/09/2016 10:41:43

## 2016-08-13 ENCOUNTER — Other Ambulatory Visit: Payer: Self-pay | Admitting: Internal Medicine

## 2016-08-13 DIAGNOSIS — M7989 Other specified soft tissue disorders: Secondary | ICD-10-CM

## 2016-08-14 ENCOUNTER — Other Ambulatory Visit (INDEPENDENT_AMBULATORY_CARE_PROVIDER_SITE_OTHER): Payer: Medicare Other

## 2016-08-14 DIAGNOSIS — I739 Peripheral vascular disease, unspecified: Secondary | ICD-10-CM | POA: Diagnosis not present

## 2016-08-15 ENCOUNTER — Encounter: Payer: Self-pay | Admitting: Urology

## 2016-08-15 ENCOUNTER — Ambulatory Visit (INDEPENDENT_AMBULATORY_CARE_PROVIDER_SITE_OTHER): Payer: Medicare Other | Admitting: Urology

## 2016-08-15 VITALS — BP 160/89 | HR 65 | Ht 72.0 in | Wt 224.7 lb

## 2016-08-15 DIAGNOSIS — R310 Gross hematuria: Secondary | ICD-10-CM

## 2016-08-15 LAB — MICROSCOPIC EXAMINATION
Bacteria, UA: NONE SEEN
Epithelial Cells (non renal): NONE SEEN /hpf (ref 0–10)

## 2016-08-15 LAB — URINALYSIS, COMPLETE
Bilirubin, UA: NEGATIVE
Glucose, UA: NEGATIVE
Ketones, UA: NEGATIVE
Leukocytes, UA: NEGATIVE
Nitrite, UA: NEGATIVE
Protein, UA: NEGATIVE
RBC, UA: NEGATIVE
Specific Gravity, UA: 1.015 (ref 1.005–1.030)
Urobilinogen, Ur: 0.2 mg/dL (ref 0.2–1.0)
pH, UA: 7 (ref 5.0–7.5)

## 2016-08-15 MED ORDER — LIDOCAINE HCL 2 % EX GEL
1.0000 "application " | Freq: Once | CUTANEOUS | Status: AC
  Administered 2016-08-15: 1 via URETHRAL

## 2016-08-15 MED ORDER — CIPROFLOXACIN HCL 500 MG PO TABS
500.0000 mg | ORAL_TABLET | Freq: Once | ORAL | Status: AC
  Administered 2016-08-15: 500 mg via ORAL

## 2016-08-15 NOTE — Progress Notes (Signed)
   08/15/16  CC:  Chief Complaint  Patient presents with  . Cysto    HPI: The patient is a 82 year old gentleman who presents today for completion of his gross hematuria workup. CT urogram was negative for any concerning findings. He did have bilateral Bosniak 1 and 2 renal cysts and a bladder diverticulum. He had no sign of malignancy or nephrolithiasis. He is only had this gross hematuria once. It is been approximately 2 months since this occurred with no recurrence. Negative urinalysis today.   Blood pressure (!) 160/89, pulse 65, height 6' (1.829 m), weight 224 lb 11.2 oz (101.9 kg). NED. A&Ox3.   No respiratory distress   Abd soft, NT, ND Normal phallus with bilateral descended testicles  Cystoscopy Procedure Note  Patient identification was confirmed, informed consent was obtained, and patient was prepped using Betadine solution.  Lidocaine jelly was administered per urethral meatus.    Preoperative abx where received prior to procedure.     Pre-Procedure: - Inspection reveals a normal caliber ureteral meatus.  Procedure: The flexible cystoscope was introduced without difficulty - No urethral strictures/lesions are present. - Enlarged prostate - Hypervascular prostate that is visually obstructive and approximate 5 cm in length. - Normal bladder neck - Bilateral ureteral orifices identified - Bladder mucosa  reveals no ulcers, tumors, or lesions - No bladder stones - No trabeculation  Retroflexion shows no intravesical lobe though significant hypervascularity surrounding the bladder neck of the prostate.   Post-Procedure: - Patient tolerated the procedure well  Assessment/ Plan:  1. Gross hematuria -negative workup. His gross hematuria is likely from his prostatic hypervascularity which was quite significant. If this returns, we can consider finasteride at that time. Otherwise he can follow up with [place] as needed.

## 2016-08-19 ENCOUNTER — Encounter: Payer: Medicare Other | Admitting: Nurse Practitioner

## 2016-08-19 DIAGNOSIS — I890 Lymphedema, not elsewhere classified: Secondary | ICD-10-CM | POA: Diagnosis not present

## 2016-08-19 DIAGNOSIS — L03115 Cellulitis of right lower limb: Secondary | ICD-10-CM | POA: Diagnosis not present

## 2016-08-19 DIAGNOSIS — I739 Peripheral vascular disease, unspecified: Secondary | ICD-10-CM | POA: Diagnosis not present

## 2016-08-19 DIAGNOSIS — R600 Localized edema: Secondary | ICD-10-CM | POA: Diagnosis not present

## 2016-08-19 DIAGNOSIS — I10 Essential (primary) hypertension: Secondary | ICD-10-CM | POA: Diagnosis not present

## 2016-08-19 DIAGNOSIS — K219 Gastro-esophageal reflux disease without esophagitis: Secondary | ICD-10-CM | POA: Diagnosis not present

## 2016-08-19 DIAGNOSIS — M1990 Unspecified osteoarthritis, unspecified site: Secondary | ICD-10-CM | POA: Diagnosis not present

## 2016-08-21 ENCOUNTER — Other Ambulatory Visit (INDEPENDENT_AMBULATORY_CARE_PROVIDER_SITE_OTHER): Payer: Self-pay | Admitting: Vascular Surgery

## 2016-08-21 ENCOUNTER — Other Ambulatory Visit: Payer: Self-pay | Admitting: Surgery

## 2016-08-21 DIAGNOSIS — I739 Peripheral vascular disease, unspecified: Secondary | ICD-10-CM

## 2016-08-21 NOTE — Progress Notes (Signed)
CHAZZ, PHILSON ([id number]) Visit Report for 08/19/2016 Chief Complaint Document Details Patient Name: Randy [name], Randy [name] 08/19/2016 12:30 Date of Service: PM Medical Record [id number] Number: Patient Account Number: [id number] Date of Birth/Sex: 1934-05-23 (82 y.o. Male) Treating RN: Angeline Slim, Other Clinician: Primary Care Provider: Sheppard Coil Treating Aki Abalos, Launa Flight, Provider/Extender: Referring Provider: Marlena Clipper in Treatment: 1 Information Obtained from: Patient Chief Complaint He is here for follow up evaluation for RLE edema, cellulitis Electronic Signature(s) Signed: 08/19/2016 5:23:40 PM By: Lawanda Cousins Entered By: Lawanda Cousins on 08/19/2016 13:01:49 Randy [name] ([id number]) -------------------------------------------------------------------------------- HPI Details Patient Name: Randy [name], Randy [name] 08/19/2016 12:30 Date of Service: PM Medical Record [id number] Number: Patient Account Number: [id number] Date of Birth/Sex: 1934-08-20 (82 y.o. Male) Treating RN: Angeline Slim, Other Clinician: Primary Care Provider: Sheppard Coil Treating Glendi Mohiuddin, Launa Flight, Provider/Extender: Referring Provider: Marlena Clipper in Treatment: 1 History of Present Illness Location: right leg more swollen than the left leg Quality: Patient reports experiencing heaviness to affected area(s). Severity: Patient states wound (s) are getting better. Duration: Patient has had the wound for > 3 months prior to seeking treatment at the wound center Timing: Pain in wound is Intermittent (comes and goes Context: The wound would happen gradually Modifying Factors: Other treatment(s) tried include:DVT workup and oral antibiotics Associated Signs and Symptoms: Patient reports having increase swelling. HPI Description: 82 year old gentleman who was recently referred to [place] by his PCP Dr. [name], a complex history which possibly could be related to  cellulitis and stasis dermatitis with unilateral swelling of his right lower extremity. He has initially been seen and given oral antibiotics for possible cellulitis and also worked up for a DVT in the ER which was found to be negative. His liquids which was started empirically was then stopped and he has been seen at various stages and put on oral antibiotics and Lasix. On the day he was last seen by his PCP he was evaluated and referred to the vascular surgeons and also to the wound care physicians. he was given injectable Rocephin and put on doxycycline and unfortunately the vascular surgery appointment was for longer than a month and hence he was referred to the wound care for possible compression. past medical history of hypertension, GERD, osteoarthritis, a pH with urinary problems and bilateral lower extremity lymphedema. He has never had a Doppler study done in the past and quit smoking several years ago. 08/19/16- he is here in follow-up evaluation for right lower extremity edema and cellulitis. He has completed his dose of doxycycline with essentially resolved erythema. He has had no significant change in the edema according to the patient. He states he is unable to place compression stockings on and therefore has had no form of compression applied since last follow-up. He does attempt leg elevation.he [facility] and 8/22 for reflux study and arterial studies. Electronic Signature(s) Signed: 08/19/2016 5:23:40 PM By: Lawanda Cousins Entered By: Lawanda Cousins on 08/19/2016 13:03:31 Randy [name], Randy [name] ([id number]) -------------------------------------------------------------------------------- Physical Exam Details Patient Name: Randy [name], Randy [name] 08/19/2016 12:30 Date of Service: PM Medical Record [id number] Number: Patient Account Number: [id number] Date of Birth/Sex: [birth year] (82 y.o. Male) Treating RN: Angeline Slim, Other  Clinician: Primary Care Provider: Sheppard Coil Treating Yaris Ferrell, Launa Flight, Provider/Extender: Referring Provider: Marlena Clipper in Treatment: 1 Constitutional BP within normal limits. afebrile. Respiratory non-labored respiratory effort. Cardiovascular RLE- palpable DP, nonpalpable PT. RLE- edema, warm to touch, diffuse faint erythema on the anterior/medial aspect, no open areas, no drainage. Musculoskeletal ambulates  with a cane. Electronic Signature(s) Signed: 08/19/2016 5:23:40 PM By: Lawanda Cousins Entered By: Lawanda Cousins on 08/19/2016 13:11:26 Randy [name] ([id number]) -------------------------------------------------------------------------------- Physician Orders Details Patient Name: Randy [name], Randy [name] 08/19/2016 12:30 Date of Service: PM Medical Record [id number] Number: Patient Account Number: [id number] Date of Birth/Sex: Sep 09, 1934 (82 y.o. Male) Treating RN: Benetta Spar, Other Clinician: Primary Care Provider: Sheppard Coil Treating Bonita Brindisi, Launa Flight, Provider/Extender: Referring Provider: Marlena Clipper in Treatment: 1 Verbal / Phone Orders: No Diagnosis Coding Follow-up Appointments o Return Appointment in 2 weeks. Edema Control o Elevate legs to the level of the heart and pump ankles as often as possible o Other: - elevate legs while sleeping wear light compression stockings Notes Vascular studies next week. Will return if necessary following those visits. Electronic Signature(s) Signed: 08/19/2016 5:23:40 PM By: Lawanda Cousins Signed: 08/20/2016 7:32:57 AM By: Gretta Cool, BSN, RN, CWS, Kim RN, BSN Entered By: Gretta Cool, BSN, RN, CWS, Kim on 08/19/2016 12:57:00 Randy [name], Randy [name] ([id number]) -------------------------------------------------------------------------------- Problem List Details Patient Name: Randy [name], Randy [name] 08/19/2016 12:30 Date of Service: PM Medical Record [id number] Number: Patient Account Number: [id number] Date of Birth/Sex:  1934-12-31 (82 y.o. Male) Treating RN: Angeline Slim, Other Clinician: Primary Care Provider: Sheppard Coil Treating Marguita Venning, Launa Flight, Provider/Extender: Referring Provider: Marlena Clipper in Treatment: 1 Active Problems ICD-10 Encounter Code Description Active Date Diagnosis I89.0 Lymphedema, not elsewhere classified 08/09/2016 Yes L03.115 Cellulitis of right lower limb 08/09/2016 Yes I73.9 Peripheral vascular disease, unspecified 08/09/2016 Yes Inactive Problems Resolved Problems Electronic Signature(s) Signed: 08/19/2016 5:23:40 PM By: Lawanda Cousins Entered By: Lawanda Cousins on 08/19/2016 13:00:53 Randy [name] ([id number]) -------------------------------------------------------------------------------- Progress Note Details Patient Name: Randy [name], Randy [name] 08/19/2016 12:30 Date of Service: PM Medical Record [id number] Number: Patient Account Number: [id number] Date of Birth/Sex: [birth year] (82 y.o. Male) Treating RN: Angeline Slim, Other Clinician: Primary Care Provider: Sheppard Coil Treating Braxston Quinter, Launa Flight, Provider/Extender: Referring Provider: Marlena Clipper in Treatment: 1 Subjective Chief Complaint Information obtained from Patient He is here for follow up evaluation for RLE edema, cellulitis History of Present Illness (HPI) The following HPI elements were documented for the patient's wound: Location: right leg more swollen than the left leg Quality: Patient reports experiencing heaviness to affected area(s). Severity: Patient states wound (s) are getting better. Duration: Patient has had the wound for > 3 months prior to seeking treatment at the wound center Timing: Pain in wound is Intermittent (comes and goes Context: The wound would happen gradually Modifying Factors: Other treatment(s) tried include:DVT workup and oral antibiotics Associated Signs and Symptoms: Patient reports having increase swelling. 82 year old gentleman  who was recently referred to [place] by his PCP Dr. [name], a complex history which possibly could be related to cellulitis and stasis dermatitis with unilateral swelling of his right lower extremity. He has initially been seen and given oral antibiotics for possible cellulitis and also worked up for a DVT in the ER which was found to be negative. His liquids which was started empirically was then stopped and he has been seen at various stages and put on oral antibiotics and Lasix. On the day he was last seen by his PCP he was evaluated and referred to the vascular surgeons and also to the wound care physicians. he was given injectable Rocephin and put on doxycycline and unfortunately the vascular surgery appointment was for longer than a month and hence he was referred to the wound care for possible compression. past medical history of hypertension, GERD, osteoarthritis, a pH with urinary problems and bilateral lower extremity lymphedema. He has never  had a Doppler study done in the past and quit smoking several years ago. 08/19/16- he is here in follow-up evaluation for right lower extremity edema and cellulitis. He has completed his dose of doxycycline with essentially resolved erythema. He has had no significant change in the edema according to the patient. He states he is unable to place compression stockings on and therefore has had no form of compression applied since last follow-up. He does attempt leg elevation.he has an appointment with [facility] on 8/24 and 8/22 for reflux study and arterial studies. Randy [name], Randy [name] ([id number]) Objective Constitutional BP within normal limits. afebrile. Vitals Time Taken: 12:43 PM, Height: 72 in, Weight: 266.6 lbs, BMI: 36.2, Temperature: 98 F, Pulse: 68 bpm, Respiratory Rate: 16 breaths/min, Blood Pressure: 142/78 mmHg. Respiratory non-labored respiratory effort. Cardiovascular RLE- palpable DP, nonpalpable PT. RLE-  edema, warm to touch, diffuse faint erythema on the anterior/medial aspect, no open areas, no drainage. Musculoskeletal ambulates with a cane. Assessment Active Problems ICD-10 I89.0 - Lymphedema, not elsewhere classified L03.115 - Cellulitis of right lower limb I73.9 - Peripheral vascular disease, unspecified improved cellulitis; continues to have no open areas, no drainage Unable to apply compression stockings Vascular appointments pending (8/22 and 8/24); he was encouraged to not cancel these appointments Randy [name], Randy [name] ([id number]) we'll schedule follow-up in 2 weeks; he was encouraged to contact clinic if any changes to the right lower extremity-including extended erythema, pain, drainage, ulcerations Plan Follow-up Appointments: Return Appointment in 2 weeks. Edema Control: Elevate legs to the level of the heart and pump ankles as often as possible Other: - elevate legs while sleeping wear light compression stockings General Notes: Vascular studies next week. Will return if necessary following those visits. 1. Maintain vascular appointments 2. Follow-up in 2 weeks 3. Continue with aggressive bilateral lower extremity elevation Electronic Signature(s) Signed: 08/19/2016 5:23:40 PM By: Lawanda Cousins Entered By: Lawanda Cousins on 08/19/2016 13:13:41 Randy [name] ([id number]) -------------------------------------------------------------------------------- SuperBill Details Patient Name: Randy [name] Date of Service: 08/19/2016 Medical Record Number: [id number] Patient Account Number: [id number] Date of Birth/Sex: [birth year] (82 y.o. Male) Treating RN: Carolyne Fiscal, Debi Primary Care Provider: [name] Other Clinician: Referring Provider: [name] Treating Provider/Extender: Cathie Olden in Treatment: 1 Diagnosis Coding ICD-10 Codes Code Description I89.0 Lymphedema, not elsewhere classified L03.115 Cellulitis of right lower limb I73.9 Peripheral  vascular disease, unspecified Facility Procedures CPT4 Code: [id number] Description: [id number] - WOUND CARE VISIT-LEV 1 EST PT Modifier: Quantity: 1 Physician Procedures CPT4 Code: [id number] Description: 85277 - WC PHYS LEVEL 3 - EST PT ICD-10 Description Diagnosis I89.0 Lymphedema, not elsewhere classified L03.115 Cellulitis of right lower limb Modifier: Quantity: 1 Electronic Signature(s) Signed: 08/19/2016 5:23:40 PM By: Lawanda Cousins Entered By: Lawanda Cousins on 08/19/2016 13:14:03

## 2016-08-21 NOTE — Progress Notes (Signed)
ION, GONNELLA ([id number]) Visit Report for 08/19/2016 Arrival Information Details Patient Name: Randy [name], Randy [name] 08/19/2016 12:30 Date of Service: PM Medical Record [id number] Number: Patient Account Number: [id number] Date of Birth/Sex: 1934-10-06 (82 y.o. Male) Treating RN: Benetta Spar, Other Clinician: Primary Care Magin Balbi: Sheppard Coil Treating Coulter, Launa Flight, Fitz Matsuo/Extender: Referring Finleigh Cheong: Marlena Clipper in Treatment: 1 Visit Information History Since Last Visit Added or deleted any medications: No Patient Arrived: Kasandra Knudsen Any new allergies or adverse reactions: No Arrival Time: 12:43 Had a fall or experienced change in No Accompanied By: self activities of daily living that may affect Transfer Assistance: None risk of falls: Patient Identification Verified: Yes Signs or symptoms of abuse/neglect since last No Secondary Verification Process Yes visito Completed: Hospitalized since last visit: No Patient Requires Transmission- No Has Compression in Place as Prescribed: No Based Precautions: Pain Present Now: No Patient Has Alerts: Yes Patient Alerts: R ABI non- compressible L ABI non- compressible Electronic Signature(s) Signed: 08/20/2016 7:32:57 AM By: Gretta Cool, BSN, RN, CWS, Kim RN, BSN Entered By: Gretta Cool, BSN, RN, CWS, Kim on 08/19/2016 12:43:34 [name] ([id number]) -------------------------------------------------------------------------------- Clinic Level of Care Assessment Details Patient Name: Randy [name], Randy [name] 08/19/2016 12:30 Date of Service: PM Medical Record [id number] Number: Patient Account Number: [id number] Date of Birth/Sex: 10-23-1934 (82 y.o. Male) Treating RN: Benetta Spar, Other Clinician: Primary Care Fianna Snowball: Sheppard Coil Treating Coulter, Launa Flight, Cory Rama/Extender: Referring Donyelle Enyeart: Marlena Clipper in Treatment: 1 Clinic Level of Care Assessment Items TOOL 4 Quantity Score []  - Use when only an  EandM is performed on FOLLOW-UP visit 0 ASSESSMENTS - Nursing Assessment / Reassessment []  - Reassessment of Co-morbidities (includes updates in patient status) 0 X - Reassessment of Adherence to Treatment Plan 1 5 ASSESSMENTS - Wound and Skin Assessment / Reassessment []  - Simple Wound Assessment / Reassessment - one wound 0 []  - Complex Wound Assessment / Reassessment - multiple wounds 0 []  - Dermatologic / Skin Assessment (not related to wound area) 0 ASSESSMENTS - Focused Assessment []  - Circumferential Edema Measurements - multi extremities 0 []  - Nutritional Assessment / Counseling / Intervention 0 []  - Lower Extremity Assessment (monofilament, tuning fork, pulses) 0 []  - Peripheral Arterial Disease Assessment (using hand held doppler) 0 ASSESSMENTS - Ostomy and/or Continence Assessment and Care []  - Incontinence Assessment and Management 0 []  - Ostomy Care Assessment and Management (repouching, etc.) 0 PROCESS - Coordination of Care X - Simple Patient / Family Education for ongoing care 1 15 []  - Complex (extensive) Patient / Family Education for ongoing care 0 []  - Staff obtains Consents, Records, Test Results / Process Orders 0 EVO, ADERMAN ([id number]) []  - Staff telephones HHA, Nursing Homes / Clarify orders / etc 0 []  - Routine Transfer to another Facility (non-emergent condition) 0 []  - Routine Hospital Admission (non-emergent condition) 0 []  - New Admissions / Biomedical engineer / Ordering NPWT, Apligraf, etc. 0 []  - Emergency Hospital Admission (emergent condition) 0 X - Simple Discharge Coordination 1 10 []  - Complex (extensive) Discharge Coordination 0 PROCESS - Special Needs []  - Pediatric / Minor Patient Management 0 []  - Isolation Patient Management 0 []  - Hearing / Language / Visual special needs 0 []  - Assessment of Community assistance (transportation, D/C planning, etc.) 0 []  - Additional assistance / Altered mentation 0 []  - Support Surface(s) Assessment  (bed, cushion, seat, etc.) 0 INTERVENTIONS - Wound Cleansing / Measurement []  - Simple Wound Cleansing - one wound 0 []  - Complex Wound Cleansing - multiple wounds 0 []  - Wound Imaging (  photographs - any number of wounds) 0 []  - Wound Tracing (instead of photographs) 0 []  - Simple Wound Measurement - one wound 0 []  - Complex Wound Measurement - multiple wounds 0 INTERVENTIONS - Wound Dressings []  - Small Wound Dressing one or multiple wounds 0 []  - Medium Wound Dressing one or multiple wounds 0 []  - Large Wound Dressing one or multiple wounds 0 []  - Application of Medications - topical 0 []  - Application of Medications - injection 0 Detore, Riven ([id number]) INTERVENTIONS - Miscellaneous []  - External ear exam 0 []  - Specimen Collection (cultures, biopsies, blood, body fluids, etc.) 0 []  - Specimen(s) / Culture(s) sent or taken to Lab for analysis 0 []  - Patient Transfer (multiple staff / Harrel Lemon Lift / Similar devices) 0 []  - Simple Staple / Suture removal (25 or less) 0 []  - Complex Staple / Suture removal (26 or more) 0 []  - Hypo / Hyperglycemic Management (close monitor of Blood Glucose) 0 []  - Ankle / Brachial Index (ABI) - do not check if billed separately 0 X - Vital Signs 1 5 Has the patient been seen at the hospital within the last three years: Yes Total Score: 35 Level Of Care: New/Established - Level 1 Electronic Signature(s) Signed: 08/20/2016 7:32:57 AM By: Gretta Cool, BSN, RN, CWS, Kim RN, BSN Entered By: Gretta Cool, BSN, RN, CWS, Kim on 08/19/2016 12:57:41 [name] ([id number]) -------------------------------------------------------------------------------- Encounter Discharge Information Details Patient Name: AMORY, Randy [name] 08/19/2016 12:30 Date of Service: PM Medical Record [id number] Number: Patient Account Number: [id number] Date of Birth/Sex: [birth year] (82 y.o. Male) Treating RN: Benetta Spar, Other Clinician: Primary Care Hisayo Delossantos: Sheppard Coil Treating Coulter,  Launa Flight, Gualberto Wahlen/Extender: Referring Garron Eline: Marlena Clipper in Treatment: 1 Encounter Discharge Information Items Discharge Pain Level: 0 Discharge Condition: Stable Ambulatory Status: Cane Discharge Destination: Home Transportation: Private Auto Accompanied By: self Schedule Follow-up Appointment: Yes Medication Reconciliation completed Yes and provided to Patient/Care Haik Mahoney: Provided on Clinical Summary of Care: 08/19/2016 Form Type Recipient Paper Patient JB Electronic Signature(s) Signed: 08/19/2016 1:02:26 PM By: Sharon Mt Entered By: Sharon Mt on 08/19/2016 13:02:26 [name] ([id number]) -------------------------------------------------------------------------------- General Visit Notes Details Patient Name: MARTISE, Randy [name] 08/19/2016 12:30 Date of Service: PM Medical Record [id number] Number: Patient Account Number: [id number] Date of Birth/Sex: [birth year] (82 y.o. Male) Treating RN: Benetta Spar, Other Clinician: Primary Care Eilis Chestnutt: Sheppard Coil Treating Coulter, Launa Flight, Tasnim Balentine/Extender: Referring Mirren Gest: Marlena Clipper in Treatment: 1 Notes Patient came in today for a return visit. He has swelling, but no open wounds on his legs. He has appointments with Vascular on 8/22 and 8/24 to address the swelling. Patient will return to the wound care center for follow-up after those appointments. Electronic Signature(s) Signed: 08/20/2016 7:32:57 AM By: Gretta Cool, BSN, RN, CWS, Kim RN, BSN Entered By: Gretta Cool, BSN, RN, CWS, Kim on 08/19/2016 17:15:52 Randy [name], Randy [name] ([id number]) -------------------------------------------------------------------------------- Lower Extremity Assessment Details Patient Name: Randy [name], Randy [name] 08/19/2016 12:30 Date of Service: PM Medical Record [id number] Number: Patient Account Number: [id number] Date of Birth/Sex: August 31, 1934 (82 y.o. Male) Treating RN: Benetta Spar, Other Clinician: Primary Care  Jamarie Joplin: Sheppard Coil Treating Coulter, Launa Flight, Tanner Yeley/Extender: Referring Zackari Ruane: Marlena Clipper in Treatment: 1 Edema Assessment Assessed: [Left: No] [Right: No] E[Left: dema] [Right: :] Calf Left: Right: Point of Measurement: cm From Medial Instep cm 46.5 cm Ankle Left: Right: Point of Measurement: 10 cm From Medial Instep cm 28 cm Vascular Assessment Pulses: Dorsalis Pedis Palpable: [Right:Yes] Posterior Tibial Extremity colors, hair growth, and conditions: Extremity Color: [Right:Red] Hair Growth on Extremity: [Right:Yes]  Temperature of Extremity: [Right:Warm] Capillary Refill: [Right:< 3 seconds] Dependent Rubor: [Right:No] Blanched when Elevated: [Right:No] Toe Nail Assessment Left: Right: Thick: No Discolored: No Deformed: No Improper Length and Hygiene: Yes Electronic Signature(s) CYLER, KAPPES ([id number]) Signed: 08/20/2016 7:32:57 AM By: Gretta Cool, BSN, RN, CWS, Kim RN, BSN Entered By: Gretta Cool, BSN, RN, CWS, Kim on 08/19/2016 12:48:04 [name] ([id number]) -------------------------------------------------------------------------------- Multi Wound Chart Details Patient Name: KELLAR, Randy [name] 08/19/2016 12:30 Date of Service: PM Medical Record [id number] Number: Patient Account Number: [id number] Date of Birth/Sex: July 31, 1934 (82 y.o. Male) Treating RN: Benetta Spar, Other Clinician: Primary Care Martinique Pizzimenti: Sheppard Coil Treating Coulter, Launa Flight, Berklee Battey/Extender: Referring Anaalicia Reimann: Marlena Clipper in Treatment: 1 Vital Signs Height(in): 72 Pulse(bpm): 68 Weight(lbs): 266.6 Blood Pressure 142/78 (mmHg): Body Mass Index(BMI): 36 Temperature(F): 98 Respiratory Rate 16 (breaths/min): Wound Assessments Treatment Notes Electronic Signature(s) Signed: 08/20/2016 7:32:57 AM By: Gretta Cool, BSN, RN, CWS, Kim RN, BSN Entered By: Gretta Cool, BSN, RN, CWS, Kim on 08/19/2016 12:53:40 ASSER, LUCENA  ([id number]) -------------------------------------------------------------------------------- Multi-Disciplinary Care Plan Details Patient Name: Randy [name], Randy [name] 08/19/2016 12:30 Date of Service: PM Medical Record [id number] Number: Patient Account Number: [id number] Date of Birth/Sex: 1934-03-03 (82 y.o. Male) Treating RN: Benetta Spar, Other Clinician: Primary Care Sunday Klos: Sheppard Coil Treating Coulter, Launa Flight, Diontre Harps/Extender: Referring Chanice Brenton: Marlena Clipper in Treatment: 1 Active Inactive ` Orientation to the Wound Care Program Nursing Diagnoses: Knowledge deficit related to the wound healing center program Goals: Patient/caregiver will verbalize understanding of the [place] Program Date Initiated: 08/09/2016 Target Resolution Date: 09/14/2016 Goal Status: Active Interventions: Provide education on orientation to the wound center Notes: ` Pain, Acute or Chronic Nursing Diagnoses: Pain, acute or chronic: actual or potential Potential alteration in comfort, pain Goals: Patient/caregiver will verbalize adequate pain control between visits Date Initiated: 08/09/2016 Target Resolution Date: 11/16/2016 Goal Status: Active Interventions: Complete pain assessment as per visit requirements Notes: ` Wound/Skin Impairment EINAR, NOLASCO ([id number]) Nursing Diagnoses: Impaired tissue integrity Knowledge deficit related to ulceration/compromised skin integrity Goals: Ulcer/skin breakdown will have a volume reduction of 80% by week 12 Date Initiated: 08/09/2016 Target Resolution Date: 12/14/2016 Goal Status: Active Interventions: Assess patient/caregiver ability to perform ulcer/skin care regimen upon admission and as needed Notes: Electronic Signature(s) Signed: 08/20/2016 7:32:57 AM By: Gretta Cool, BSN, RN, CWS, Kim RN, BSN Entered By: Gretta Cool, BSN, RN, CWS, Kim on 08/19/2016 12:53:26 [name]  ([id number]) -------------------------------------------------------------------------------- Non-Wound Condition Assessment Details Patient Name: EZECHIEL, Randy [name] 08/19/2016 12:30 Date of Service: PM Medical Record [id number] Number: Patient Account Number: [id number] Date of Birth/Sex: [birth year] (82 y.o. Male) Treating RN: Benetta Spar, Other Clinician: Primary Care Jelesa Mangini: Sheppard Coil Treating Coulter, Launa Flight, Amedio Bowlby/Extender: Referring Jaelen Gellerman: Marlena Clipper in Treatment: 1 Non-Wound Condition: Condition: Other Dermatologic Condition Location: Leg Side: Right Photos Periwound Skin Texture Texture Color No Abnormalities Noted: No No Abnormalities Noted: No Callus: No Atrophie Blanche: No Crepitus: No Cyanosis: No Excoriation: No Ecchymosis: No Friable: No Erythema: No Induration: No Hemosiderin Staining: No Rash: No Mottled: No Scarring: No Pallor: No Rubor: No Moisture No Abnormalities Noted: No Temperature / Pain Dry / Scaly: Yes Temperature: No Abnormality Maceration: No Electronic Signature(s) Signed: 08/20/2016 7:32:57 AM By: Gretta Cool, BSN, RN, CWS, Kim RN, BSN Entered By: Gretta Cool, BSN, RN, CWS, Kim on 08/19/2016 17:14:12 [name] ([id number]) -------------------------------------------------------------------------------- Pain Assessment Details Patient Name: AYDIN, HINK 08/19/2016 12:30 Date of Service: PM Medical Record [id number] Number: Patient Account Number: [id number] Date of Birth/Sex: Sep 12, 1934 (82 y.o. Male) Treating RN: Benetta Spar, Other Clinician: Primary Care Grae Cannata: [name], Denny Peon  KARAMALEGOS, Charisse Wendell/Extender: Referring Izik Bingman: Marlena Clipper in Treatment: 1 Active Problems Location of Pain Severity and Description of Pain Patient Has Paino No Site Locations With Dressing Change: No Pain Management and Medication Current Pain Management: Electronic Signature(s) Signed:  08/20/2016 7:32:57 AM By: Gretta Cool, BSN, RN, CWS, Kim RN, BSN Entered By: Gretta Cool, BSN, RN, CWS, Kim on 08/19/2016 12:43:42 [name] ([id number]) -------------------------------------------------------------------------------- Patient/Caregiver Education Details Patient Name: Randy [name], Randy [name] 08/19/2016 12:30 Date of Service: PM Medical Record [id number] Number: Patient Account Number: [id number] Date of Birth/Gender: 05/28/1934 (82 y.o. Male) Treating RN: Cornell Barman Bright, Other Clinician: Physician: Sheppard Coil Treating Coulter, Launa Flight, Physician/Extender: Referring Physician: Marlena Clipper in Treatment: 1 Education Assessment Education Provided To: Patient Education Topics Provided Venous: Handouts: Other: patient to wear his own compression stockings Methods: Demonstration, Explain/Verbal Responses: State content correctly Electronic Signature(s) Signed: 08/20/2016 7:32:57 AM By: Gretta Cool, BSN, RN, CWS, Kim RN, BSN Entered By: Gretta Cool, BSN, RN, CWS, Kim on 08/19/2016 12:59:05 [name] ([id number]) -------------------------------------------------------------------------------- Vitals Details Patient Name: PERCY, WINTERROWD 08/19/2016 12:30 Date of Service: PM Medical Record [id number] Number: Patient Account Number: [id number] Date of Birth/Sex: 11-16-1934 (82 y.o. Male) Treating RN: Benetta Spar, Other Clinician: Primary Care Berlyn Saylor: Sheppard Coil Treating Coulter, Launa Flight, Rubena Roseman/Extender: Referring Kathreen Dileo: Marlena Clipper in Treatment: 1 Vital Signs Time Taken: 12:43 Temperature (F): 98 Height (in): 72 Pulse (bpm): 68 Weight (lbs): 266.6 Respiratory Rate (breaths/min): 16 Body Mass Index (BMI): 36.2 Blood Pressure (mmHg): 142/78 Reference Range: 80 - 120 mg / dl Electronic Signature(s) Signed: 08/20/2016 7:32:57 AM By: Gretta Cool, BSN, RN, CWS, Kim RN, BSN Entered By: Gretta Cool, BSN, RN, CWS, Kim on 08/19/2016 12:45:55

## 2016-08-27 ENCOUNTER — Other Ambulatory Visit: Payer: Self-pay | Admitting: Family Medicine

## 2016-08-27 DIAGNOSIS — I83813 Varicose veins of bilateral lower extremities with pain: Secondary | ICD-10-CM

## 2016-08-28 ENCOUNTER — Encounter (INDEPENDENT_AMBULATORY_CARE_PROVIDER_SITE_OTHER): Payer: Medicare Other

## 2016-08-28 ENCOUNTER — Ambulatory Visit (INDEPENDENT_AMBULATORY_CARE_PROVIDER_SITE_OTHER): Payer: Medicare Other

## 2016-08-28 DIAGNOSIS — I83813 Varicose veins of bilateral lower extremities with pain: Secondary | ICD-10-CM | POA: Diagnosis not present

## 2016-08-29 ENCOUNTER — Telehealth: Payer: Self-pay | Admitting: Family Medicine

## 2016-08-29 NOTE — Telephone Encounter (Signed)
Called pt to schedule for Annual Wellness Visit with Nurse Health Advisor, Tiffany Hill, my c/b # is [phone]  Randy [name] ° °

## 2016-08-30 ENCOUNTER — Encounter (INDEPENDENT_AMBULATORY_CARE_PROVIDER_SITE_OTHER): Payer: Self-pay | Admitting: Vascular Surgery

## 2016-08-30 ENCOUNTER — Other Ambulatory Visit: Payer: Self-pay

## 2016-08-30 ENCOUNTER — Ambulatory Visit (INDEPENDENT_AMBULATORY_CARE_PROVIDER_SITE_OTHER): Payer: Medicare Other | Admitting: Vascular Surgery

## 2016-08-30 VITALS — BP 142/77 | HR 67 | Ht 72.0 in | Wt 225.0 lb

## 2016-08-30 DIAGNOSIS — E782 Mixed hyperlipidemia: Secondary | ICD-10-CM

## 2016-08-30 DIAGNOSIS — I872 Venous insufficiency (chronic) (peripheral): Secondary | ICD-10-CM | POA: Diagnosis not present

## 2016-08-30 DIAGNOSIS — R600 Localized edema: Secondary | ICD-10-CM

## 2016-08-30 DIAGNOSIS — I10 Essential (primary) hypertension: Secondary | ICD-10-CM

## 2016-08-30 MED ORDER — ENALAPRIL MALEATE 10 MG PO TABS: 10.0000 mg | ORAL_TABLET | Freq: Every day | ORAL | 3 refills | Status: DC

## 2016-08-30 NOTE — Assessment & Plan Note (Signed)
He had noninvasive studies including an ultrasound originally that was negative for DVT. He has had more detailed noninvasive studies in our office which have shown normal arterial perfusion with normal ABIs and waveforms consistent with no lower extremity arterial insufficiency. A venous reflux study was performed this week demonstrating bilateral deep venous reflux, great saphenous vein reflux, and right small saphenous vein reflux. No DVT or superficial thrombophlebitis was identified. We had a long discussion today about the pathophysiology and natural history of venous insufficiency. The patient has had a good initial response to compression stockings, but his leg is still markedly swollen. I have told him the importance of continuing to wear the compression stockings on a daily basis. He should elevate his legs and use anti-inflammatories as needed for the discomfort. I have recommended increasing his activity and trying to walk more. I discussed the role of laser ablation and other invasive therapies that may be of benefit going forward. He will continue to wear 20-30 mmHg compression stockings and return to my clinic in 3 months for reevaluation. At that point, we can determine whether or not invasive therapy would be of benefit.

## 2016-08-30 NOTE — Assessment & Plan Note (Signed)
lipid control important in reducing the progression of atherosclerotic disease. Intolerant to statins  

## 2016-08-30 NOTE — Progress Notes (Signed)
Patient ID: Randy [name], male   DOB: 25-Oct-1934, 82 y.o.   MRN: [id number]  Chief Complaint  Patient presents with  . New Patient (Initial Visit)    Arterial and Venous done    HPI Randy [name] is a 82 y.o. male.  I am asked to see the patient by [facility] for evaluation of leg swelling.  The patient reports It is actually much better now than it was several weeks ago. He has been seen by the wound care center and started using compression stocking several weeks ago with good results. His right lower extremity is more severely affected of the 2 legs. There was no clear inciting event or causative factor that started the symptoms. He denies any claudication, ischemic rest pain, or tissue loss. The leg is tired and heavy but this has improved as well. He denies an antecedent history of DVT or superficial thrombophlebitis. He does not have any chest pain or shortness of breath. He had noninvasive studies including an ultrasound originally that was negative for DVT. He has had more detailed noninvasive studies in our office which have shown normal arterial perfusion with normal ABIs and waveforms consistent with no lower extremity arterial insufficiency. A venous reflux study was performed this week demonstrating bilateral deep venous reflux, great saphenous vein reflux, and right small saphenous vein reflux. No DVT or superficial thrombophlebitis was identified. He continues to wear the compression stockings and says these have helped more than anything.   Past Medical History:  Diagnosis Date  . GERD (gastroesophageal reflux disease)   . Hyperlipidemia   . Hypertension   . Osteoporosis     Past Surgical History:  Procedure Laterality Date  . GANGLION CYST EXCISION Left 2013   Left upper arm  . HERNIA REPAIR N/A 2008   MIdline, ventral acquired hernia repair    Family History  Problem Relation Age of Onset  . Breast cancer Mother   . Lung cancer Father   . Heart disease Brother   .  Leukemia Brother   . Prostate cancer Neg Hx   . Colon cancer Neg Hx   . Bladder Cancer Neg Hx   . Kidney cancer Neg Hx      Social History Social History  Substance Use Topics  . Smoking status: Former Smoker    Packs/day: 1.00    Years: 12.00    Types: Cigarettes    Quit date: 1968  . Smokeless tobacco: Former Systems developer  . Alcohol use No     Allergies  Allergen Reactions  . Antihistamines, Chlorpheniramine-Type Other (See Comments)    Can't urinate    Current Outpatient Prescriptions  Medication Sig Dispense Refill  . calcium citrate-vitamin D (CITRACAL+D) 315-200 MG-UNIT tablet Take 1 tablet by mouth daily.    . Coenzyme Q10 (CO Q 10) 100 MG CAPS Take by mouth.    . doxycycline (VIBRA-TABS) 100 MG tablet Take 1 tablet (100 mg total) by mouth 2 (two) times daily. For 10 days. Take with full glass of water, stay upright 30 min after taking. 20 tablet 0  . enalapril (VASOTEC) 10 MG tablet Take 1 tablet (10 mg total) by mouth daily. 90 tablet 3  . furosemide (LASIX) 20 MG tablet Take 1 tablet (20 mg total) by mouth daily as needed for edema. For 1 week take 2 pills in morning then resume normal dose. 45 tablet 2  . Omega-3 Fatty Acids (FISH OIL) 1200 MG CAPS Take by mouth.    [name] omeprazole (  PRILOSEC) 20 MG capsule Take 1 capsule (20 mg total) by mouth daily. 90 capsule 3  . potassium chloride (K-DUR) 10 MEQ tablet Take 1 tablet (10 mEq total) by mouth daily. 3 tablet 0   No current facility-administered medications for this visit.       REVIEW OF SYSTEMS (Negative unless checked)  Constitutional: '[]' Weight loss  '[]' Fever  '[]' Chills Cardiac: '[]' Chest pain   '[]' Chest pressure   '[]' Palpitations   '[]' Shortness of breath when laying flat   '[]' Shortness of breath at rest   '[]' Shortness of breath with exertion. Vascular:  '[x]' Pain in legs with walking   '[]' Pain in legs at rest   '[]' Pain in legs when laying flat   '[]' Claudication   '[]' Pain in feet when walking  '[]' Pain in feet at rest  '[]' Pain in feet  when laying flat   '[]' History of DVT   '[]' Phlebitis   '[x]' Swelling in legs   '[x]' Varicose veins   '[]' Non-healing ulcers Pulmonary:   '[]' Uses home oxygen   '[]' Productive cough   '[]' Hemoptysis   '[]' Wheeze  '[]' COPD   '[]' Asthma Neurologic:  '[]' Dizziness  '[]' Blackouts   '[]' Seizures   '[]' History of stroke   '[]' History of TIA  '[]' Aphasia   '[]' Temporary blindness   '[]' Dysphagia   '[]' Weakness or numbness in arms   '[]' Weakness or numbness in legs Musculoskeletal:  '[x]' Arthritis   '[]' Joint swelling   '[]' Joint pain   '[]' Low back pain Hematologic:  '[]' Easy bruising  '[]' Easy bleeding   '[]' Hypercoagulable state   '[]' Anemic  '[]' Hepatitis Gastrointestinal:  '[]' Blood in stool   '[]' Vomiting blood  '[]' Gastroesophageal reflux/heartburn   '[]' Abdominal pain Genitourinary:  '[]' Chronic kidney disease   '[]' Difficult urination  '[]' Frequent urination  '[]' Burning with urination   '[]' Hematuria Skin:  '[]' Rashes   '[]' Ulcers   '[]' Wounds Psychological:  '[]' History of anxiety   '[]'  History of major depression.    Physical Exam BP (!) 142/77 (BP Location: Right Arm)   Pulse 67   Ht 6' (1.829 m)   Wt 225 lb (102.1 kg)   BMI 30.52 kg/m  Gen:  WD/WN, NAD Head: [place]/AT, No temporalis wasting.  Ear/Nose/Throat: Hearing grossly intact, nares w/o erythema or drainage, oropharynx w/o Erythema/Exudate Eyes: Conjunctiva clear, sclera non-icteric  Neck: trachea midline.  No JVD.  Pulmonary:  Good air movement, respirations not labored, no use of accessory muscles Cardiac: RRR, normal S1, S2 Vascular:  Vessel Right Left  Radial Palpable Palpable                          PT Not Palpable 1+ Palpable  DP 1+ Palpable Palpable   Gastrointestinal: soft, non-tender/non-distended.  Musculoskeletal: M/S 5/5 throughout.  Extremities without ischemic changes.  No deformity or atrophy. 2-3+ right lower extremity edema, 1+ left lower extremity edema. Neurologic: Sensation grossly intact in extremities.  Symmetrical.  Speech is fluent. Motor exam as listed above. Psychiatric:  Judgment intact, Mood & affect appropriate for pt's clinical situation. Dermatologic: No rashes or ulcers noted.  No cellulitis or open wounds.    Radiology Ct Hematuria Workup  Result Date: 08/07/2016 CLINICAL DATA:  1 episode of gross hematuria x 1 month ago. Denies hx kidney stones. Hx hernia repair. EXAM: CT ABDOMEN AND PELVIS WITHOUT AND WITH CONTRAST TECHNIQUE: Multidetector CT imaging of the abdomen and pelvis was performed following the standard protocol before and following the bolus administration of intravenous contrast. CONTRAST:  136m ISOVUE-300 IOPAMIDOL (ISOVUE-300) INJECTION 61% COMPARISON:  None. FINDINGS: Lower chest: Lung bases are clear. Hepatobiliary: No  focal hepatic lesion. Multiple small gallstones present Pancreas: Pancreas is normal. No ductal dilatation. No pancreatic inflammation. Spleen: Normal spleen Adrenals/urinary tract: Adrenal glands normal. There bilateral multiple mixed density round renal lesions. None of the lesions demonstrate post-contrast enhancement. Approximately 15 lesions in the RIGHT kidney. Approximately 6 lesions in the LEFT kidney which are larger than the RIGHT but again without enhancement. No ureterolithiasis or obstructive uropathy. Delayed imaging demonstrates no filling defects within collecting systems or ureters. No bladder calculi. No enhancing bladder lesion. There is mildly complex diverticulum extending from the dome of the bladder. The prostate gland indents the base the bladder. No filling defect within the bladder on delayed imaging. Some bladder wall trabeculation noted. Stomach/Bowel: Stomach, small bowel, appendix, and cecum are normal. The colon and rectosigmoid colon are normal. Vascular/Lymphatic: Abdominal aorta is normal caliber. There is no retroperitoneal or periportal lymphadenopathy. No pelvic lymphadenopathy. Reproductive: Nodular prostate gland indents the base the bladder. Other: No free fluid. Musculoskeletal: No aggressive  osseous lesion. IMPRESSION: 1. Multiple Bosniak 1 and Bosniak 2 nonenhancing renal cysts within the LEFT and RIGHT kidney. 2. No ureterolithiasis or obstructive uropathy. 3. No bladder calculi are enhancing bladder lesion. 4. Bladder diverticulum noted. 5. Prostate gland indents the base of the bladder. Electronically Signed   By: Suzy Bouchard M.D.   On: 08/07/2016 10:32    Labs Recent Results (from the past 2160 hour(s))  Urinalysis, Complete     Status: None   Collection Time: 07/03/16  3:10 PM  Result Value Ref Range   Specific Gravity, UA 1.015 1.005 - 1.030   pH, UA 6.5 5.0 - 7.5   Color, UA Yellow Yellow   Appearance Ur Clear Clear   Leukocytes, UA Negative Negative   Protein, UA Negative Negative/Trace   Glucose, UA Negative Negative   Ketones, UA Negative Negative   RBC, UA Negative Negative   Bilirubin, UA Negative Negative   Urobilinogen, Ur 0.2 0.2 - 1.0 mg/dL   Nitrite, UA Negative Negative  CBC     Status: Abnormal   Collection Time: 07/25/16  4:08 PM  Result Value Ref Range   WBC 6.2 3.8 - 10.6 K/uL   RBC 4.38 (L) 4.40 - 5.90 MIL/uL   Hemoglobin 13.2 13.0 - 18.0 g/dL   HCT 38.4 (L) 40.0 - 52.0 %   MCV 87.7 80.0 - 100.0 fL   MCH 30.1 26.0 - 34.0 pg   MCHC 34.3 32.0 - 36.0 g/dL   RDW 13.8 11.5 - 14.5 %   Platelets 210 150 - 440 K/uL  Basic metabolic panel     Status: Abnormal   Collection Time: 07/25/16  4:08 PM  Result Value Ref Range   Sodium 140 135 - 145 mmol/L   Potassium 3.6 3.5 - 5.1 mmol/L   Chloride 103 101 - 111 mmol/L   CO2 28 22 - 32 mmol/L   Glucose, Bld 154 (H) 65 - 99 mg/dL   BUN 20 6 - 20 mg/dL   Creatinine, Ser 0.91 0.61 - 1.24 mg/dL   Calcium 8.7 (L) 8.9 - 10.3 mg/dL   GFR calc non Af Amer >60 >60 mL/min   GFR calc Af Amer >60 >60 mL/min    Comment: (NOTE) The eGFR has been calculated using the CKD EPI equation. This calculation has not been validated in all clinical situations. eGFR's persistently <60 mL/min signify possible Chronic  Kidney Disease.    Anion gap 9 5 - 15  Urinalysis, Complete     Status:  None   Collection Time: 08/15/16  1:11 PM  Result Value Ref Range   Specific Gravity, UA 1.015 1.005 - 1.030   pH, UA 7.0 5.0 - 7.5   Color, UA Yellow Yellow   Appearance Ur Clear Clear   Leukocytes, UA Negative Negative   Protein, UA Negative Negative/Trace   Glucose, UA Negative Negative   Ketones, UA Negative Negative   RBC, UA Negative Negative   Bilirubin, UA Negative Negative   Urobilinogen, Ur 0.2 0.2 - 1.0 mg/dL   Nitrite, UA Negative Negative   Microscopic Examination See below:   Microscopic Examination     Status: Abnormal   Collection Time: 08/15/16  1:11 PM  Result Value Ref Range   WBC, UA 0-5 0 - 5 /hpf   RBC, UA 0-2 0 - 2 /hpf   Epithelial Cells (non renal) None seen 0 - 10 /hpf   Mucus, UA Present (A) Not Estab.   Bacteria, UA None seen None seen/Few    Assessment/Plan:  Hyperlipidemia lipid control important in reducing the progression of atherosclerotic disease. Intolerant to statins   Hypertension blood pressure control important in reducing the progression of atherosclerotic disease. On appropriate oral medications.   Chronic venous insufficiency He had noninvasive studies including an ultrasound originally that was negative for DVT. He has had more detailed noninvasive studies in our office which have shown normal arterial perfusion with normal ABIs and waveforms consistent with no lower extremity arterial insufficiency. A venous reflux study was performed this week demonstrating bilateral deep venous reflux, great saphenous vein reflux, and right small saphenous vein reflux. No DVT or superficial thrombophlebitis was identified. We had a long discussion today about the pathophysiology and natural history of venous insufficiency. The patient has had a good initial response to compression stockings, but his leg is still markedly swollen. I have told him the importance of continuing  to wear the compression stockings on a daily basis. He should elevate his legs and use anti-inflammatories as needed for the discomfort. I have recommended increasing his activity and trying to walk more. I discussed the role of laser ablation and other invasive therapies that may be of benefit going forward. He will continue to wear 20-30 mmHg compression stockings and return to my clinic in 3 months for reevaluation. At that point, we can determine whether or not invasive therapy would be of benefit.      Leotis Pain 08/30/2016, 4:38 PM   This note was created with Dragon medical transcription system.  Any errors from dictation are unintentional.

## 2016-08-30 NOTE — Assessment & Plan Note (Signed)
blood pressure control important in reducing the progression of atherosclerotic disease. On appropriate oral medications.  

## 2016-08-30 NOTE — Patient Instructions (Signed)

## 2016-09-02 ENCOUNTER — Encounter: Payer: Medicare Other | Attending: Surgery | Admitting: Surgery

## 2016-09-02 DIAGNOSIS — I10 Essential (primary) hypertension: Secondary | ICD-10-CM | POA: Diagnosis not present

## 2016-09-02 DIAGNOSIS — M1990 Unspecified osteoarthritis, unspecified site: Secondary | ICD-10-CM | POA: Diagnosis not present

## 2016-09-02 DIAGNOSIS — I890 Lymphedema, not elsewhere classified: Secondary | ICD-10-CM | POA: Insufficient documentation

## 2016-09-02 DIAGNOSIS — I771 Stricture of artery: Secondary | ICD-10-CM | POA: Diagnosis not present

## 2016-09-02 DIAGNOSIS — I87303 Chronic venous hypertension (idiopathic) without complications of bilateral lower extremity: Secondary | ICD-10-CM | POA: Diagnosis not present

## 2016-09-02 DIAGNOSIS — Z87891 Personal history of nicotine dependence: Secondary | ICD-10-CM | POA: Diagnosis not present

## 2016-09-02 DIAGNOSIS — L03115 Cellulitis of right lower limb: Secondary | ICD-10-CM | POA: Diagnosis not present

## 2016-09-02 DIAGNOSIS — K219 Gastro-esophageal reflux disease without esophagitis: Secondary | ICD-10-CM | POA: Insufficient documentation

## 2016-09-02 NOTE — Progress Notes (Signed)
LORY, GALAN ([id number]) Visit Report for 09/02/2016 Arrival Information Details Patient Name: Randy [name], Randy [name] 09/02/2016 12:30 Date of Service: PM Medical Record [id number] Number: Patient Account Number: [id number] Date of Birth/Sex: 04-Apr-1934 (82 y.o. Male) Treating RN: Angeline Slim, Other Clinician: Primary Care Josefa [place]: Sheppard Coil Treating Britto, Ishmael Holter, Tremaine Earwood/Extender: Referring Javana Schey: Marlena Clipper in Treatment: 3 Visit Information History Since Last Visit All ordered tests and consults were completed: No Patient Arrived: Kasandra Knudsen Added or deleted any medications: No Arrival Time: 12:40 Any new allergies or adverse reactions: No Accompanied By: self Had a fall or experienced change in No Transfer Assistance: None activities of daily living that may affect Patient Identification Verified: Yes risk of falls: Secondary Verification Process Yes Signs or symptoms of abuse/neglect since last No Completed: visito Patient Requires Transmission- No Hospitalized since last visit: No Based Precautions: Pain Present Now: No Patient Has Alerts: Yes Patient Alerts: R ABI non- compressible L ABI non- compressible Electronic Signature(s) Signed: 09/02/2016 3:48:47 PM By: Alric Quan Entered By: Alric Quan on 09/02/2016 12:41:40 [name] ([id number]) -------------------------------------------------------------------------------- Clinic Level of Care Assessment Details Patient Name: [name], [name] 09/02/2016 12:30 Date of Service: PM Medical Record [id number] Number: Patient Account Number: [id number] Date of Birth/Sex: 04/16/1934 (82 y.o. Male) Treating RN: Angeline Slim, Other Clinician: Primary Care Cuahutemoc Attar: Sheppard Coil Treating Britto, Ishmael Holter, Neamiah Sciarra/Extender: Referring Rece Zechman: Marlena Clipper in Treatment: 3 Clinic Level of Care Assessment Items TOOL 4 Quantity Score X - Use when only an EandM is  performed on FOLLOW-UP visit 1 0 ASSESSMENTS - Nursing Assessment / Reassessment X - Reassessment of Co-morbidities (includes updates in patient status) 1 10 X - Reassessment of Adherence to Treatment Plan 1 5 ASSESSMENTS - Wound and Skin Assessment / Reassessment []  - Simple Wound Assessment / Reassessment - one wound 0 []  - Complex Wound Assessment / Reassessment - multiple wounds 0 []  - Dermatologic / Skin Assessment (not related to wound area) 0 ASSESSMENTS - Focused Assessment X - Circumferential Edema Measurements - multi extremities 2 5 []  - Nutritional Assessment / Counseling / Intervention 0 []  - Lower Extremity Assessment (monofilament, tuning fork, pulses) 0 []  - Peripheral Arterial Disease Assessment (using hand held doppler) 0 ASSESSMENTS - Ostomy and/or Continence Assessment and Care []  - Incontinence Assessment and Management 0 []  - Ostomy Care Assessment and Management (repouching, etc.) 0 PROCESS - Coordination of Care []  - Simple Patient / Family Education for ongoing care 0 X - Complex (extensive) Patient / Family Education for ongoing care 1 20 X - Staff obtains Consents, Records, Test Results / Process Orders 1 10 WONG, STEADHAM ([id number]) []  - Staff telephones HHA, Nursing Homes / Clarify orders / etc 0 []  - Routine Transfer to another Facility (non-emergent condition) 0 []  - Routine Hospital Admission (non-emergent condition) 0 []  - New Admissions / Biomedical engineer / Ordering NPWT, Apligraf, etc. 0 []  - Emergency Hospital Admission (emergent condition) 0 X - Simple Discharge Coordination 1 10 []  - Complex (extensive) Discharge Coordination 0 PROCESS - Special Needs []  - Pediatric / Minor Patient Management 0 []  - Isolation Patient Management 0 []  - Hearing / Language / Visual special needs 0 []  - Assessment of Community assistance (transportation, D/C planning, etc.) 0 []  - Additional assistance / Altered mentation 0 []  - Support Surface(s) Assessment  (bed, cushion, seat, etc.) 0 INTERVENTIONS - Wound Cleansing / Measurement []  - Simple Wound Cleansing - one wound 0 []  - Complex Wound Cleansing - multiple wounds 0 X - Wound Imaging (photographs - any  number of wounds) 1 5 []  - Wound Tracing (instead of photographs) 0 []  - Simple Wound Measurement - one wound 0 []  - Complex Wound Measurement - multiple wounds 0 INTERVENTIONS - Wound Dressings []  - Small Wound Dressing one or multiple wounds 0 []  - Medium Wound Dressing one or multiple wounds 0 []  - Large Wound Dressing one or multiple wounds 0 []  - Application of Medications - topical 0 []  - Application of Medications - injection 0 Eakins, Mavis ([id number]) INTERVENTIONS - Miscellaneous []  - External ear exam 0 []  - Specimen Collection (cultures, biopsies, blood, body fluids, etc.) 0 []  - Specimen(s) / Culture(s) sent or taken to Lab for analysis 0 []  - Patient Transfer (multiple staff / Harrel Lemon Lift / Similar devices) 0 []  - Simple Staple / Suture removal (25 or less) 0 []  - Complex Staple / Suture removal (26 or more) 0 []  - Hypo / Hyperglycemic Management (close monitor of Blood Glucose) 0 []  - Ankle / Brachial Index (ABI) - do not check if billed separately 0 X - Vital Signs 1 5 Has the patient been seen at the hospital within the last three years: Yes Total Score: 75 Level Of Care: New/Established - Level 2 Electronic Signature(s) Signed: 09/02/2016 3:48:47 PM By: Alric Quan Entered By: Alric Quan on 09/02/2016 15:41:27 [name] ([id number]) -------------------------------------------------------------------------------- Encounter Discharge Information Details Patient Name: Randy [name] 09/02/2016 12:30 Date of Service: PM Medical Record [id number] Number: Patient Account Number: [id number] Date of Birth/Sex: November 24, 1934 (82 y.o. Male) Treating RN: Angeline Slim, Other Clinician: Primary Care Kyland No: Sheppard Coil Treating Britto,  Ishmael Holter, Kaina Orengo/Extender: Referring Suanne Minahan: Marlena Clipper in Treatment: 3 Encounter Discharge Information Items Discharge Pain Level: 0 Discharge Condition: Stable Ambulatory Status: Cane Discharge Destination: Home Transportation: Private Auto Accompanied By: self Schedule Follow-up Appointment: Yes Medication Reconciliation completed No and provided to Patient/Care Krish Bailly: Provided on Clinical Summary of Care: 09/02/2016 Form Type Recipient Paper Patient jb Electronic Signature(s) Signed: 09/02/2016 1:18:12 PM By: Lorine Bears RCP, RRT, CHT Entered By: Lorine Bears on 09/02/2016 13:18:12 [name] ([id number]) -------------------------------------------------------------------------------- Lower Extremity Assessment Details Patient Name: OSUALDO, HANSELL 09/02/2016 12:30 Date of Service: PM Medical Record [id number] Number: Patient Account Number: [id number] Date of Birth/Sex: 12/19/1934 (82 y.o. Male) Treating RN: Angeline Slim, Other Clinician: Primary Care Lasonja Lakins: Sheppard Coil Treating Britto, Ishmael Holter, Katelyn Broadnax/Extender: Referring Anothy Bufano: Marlena Clipper in Treatment: 3 Edema Assessment Assessed: [Left: No] [Right: No] E[Left: dema] [Right: :] Calf Left: Right: Point of Measurement: 36 cm From Medial Instep 41 cm 46 cm Ankle Left: Right: Point of Measurement: 10 cm From Medial Instep 26 cm 27.8 cm Vascular Assessment Pulses: Dorsalis Pedis Palpable: [Left:Yes] [Right:Yes] Posterior Tibial Extremity colors, hair growth, and conditions: Extremity Color: [Left:Mottled] [Right:Red] Temperature of Extremity: [Left:Warm] [Right:Warm] Capillary Refill: [Left:< 3 seconds] [Right:< 3 seconds] Toe Nail Assessment Left: Right: Thick: No No Discolored: No No Deformed: No No Improper Length and Hygiene: No No Notes Left heel to knee-52cm Right heel to knee-52cm Electronic  Signature(s) BEN, HABERMANN ([id number]) Signed: 09/02/2016 3:48:47 PM By: Alric Quan Entered By: Alric Quan on 09/02/2016 13:09:42 [name] ([id number]) -------------------------------------------------------------------------------- Multi Wound Chart Details Patient Name: TYRAE, ALCOSER 09/02/2016 12:30 Date of Service: PM Medical Record [id number] Number: Patient Account Number: [id number] Date of Birth/Sex: 1934-07-03 (82 y.o. Male) Treating RN: Angeline Slim, Other Clinician: Primary Care Houda Brau: Sheppard Coil Treating Britto, Ishmael Holter, Anela Bensman/Extender: Referring Stefani Baik: Marlena Clipper in Treatment: 3 Vital Signs Height(in): 72 Pulse(bpm): 63 Weight(lbs): 266.6 Blood Pressure 135/76 (mmHg): Body  Mass Index(BMI): 36 Temperature(F): 97.6 Respiratory Rate 16 (breaths/min): Wound Assessments Treatment Notes Electronic Signature(s) Signed: 09/02/2016 2:50:55 PM By: Christin Fudge MD, FACS Entered By: Christin Fudge on 09/02/2016 13:33:54 ALANSON, HAUSMANN ([id number]) -------------------------------------------------------------------------------- [place] Details Patient Name: DAVONTA, STROOT 09/02/2016 12:30 Date of Service: PM Medical Record [id number] Number: Patient Account Number: [id number] Date of Birth/Sex: Jun 27, 1934 (82 y.o. Male) Treating RN: Angeline Slim, Other Clinician: Primary Care Kyley Laurel: Sheppard Coil Treating Britto, Ishmael Holter, Alfredia Desanctis/Extender: Referring Taelor Waymire: Marlena Clipper in Treatment: 3 Active Inactive ` Orientation to the Wound Care Program Nursing Diagnoses: Knowledge deficit related to the wound healing center program Goals: Patient/caregiver will verbalize understanding of the [place] Program Date Initiated: 08/09/2016 Target Resolution Date: 09/14/2016 Goal Status: Active Interventions: Provide education on orientation to the wound  center Notes: ` Pain, Acute or Chronic Nursing Diagnoses: Pain, acute or chronic: actual or potential Potential alteration in comfort, pain Goals: Patient/caregiver will verbalize adequate pain control between visits Date Initiated: 08/09/2016 Target Resolution Date: 11/16/2016 Goal Status: Active Interventions: Complete pain assessment as per visit requirements Notes: ` Wound/Skin Impairment DAQUAN, CRAPPS ([id number]) Nursing Diagnoses: Impaired tissue integrity Knowledge deficit related to ulceration/compromised skin integrity Goals: Ulcer/skin breakdown will have a volume reduction of 80% by week 12 Date Initiated: 08/09/2016 Target Resolution Date: 12/14/2016 Goal Status: Active Interventions: Assess patient/caregiver ability to perform ulcer/skin care regimen upon admission and as needed Notes: Electronic Signature(s) Signed: 09/02/2016 3:48:47 PM By: Alric Quan Entered By: Alric Quan on 09/02/2016 12:52:47 [name] ([id number]) -------------------------------------------------------------------------------- Non-Wound Condition Assessment Details Patient Name: RAM, HAUGAN 09/02/2016 12:30 Date of Service: PM Medical Record [id number] Number: Patient Account Number: [id number] Date of Birth/Sex: [birth year] (82 y.o. Male) Treating RN: Angeline Slim, Other Clinician: Primary Care Alixander Rallis: Sheppard Coil Treating Britto, Ishmael Holter, Hildegard Hlavac/Extender: Referring Koltyn Kelsay: Marlena Clipper in Treatment: 3 Non-Wound Condition: Condition: Other Dermatologic Condition Location: Leg Side: Right Photos Periwound Skin Texture Texture Color No Abnormalities Noted: No No Abnormalities Noted: No Callus: No Atrophie Blanche: No Crepitus: No Cyanosis: No Excoriation: No Ecchymosis: No Friable: No Erythema: No Induration: No Hemosiderin Staining: No Rash: No Mottled: No Scarring: No Pallor: No Rubor: No Moisture No Abnormalities Noted:  No Temperature / Pain Dry / Scaly: Yes Temperature: No Abnormality Maceration: No Electronic Signature(s) Signed: 09/02/2016 3:48:47 PM By: Alric Quan Entered By: Alric Quan on 09/02/2016 15:45:49 [name] ([id number]) [place], Jeneen Rinks ([id number]) -------------------------------------------------------------------------------- Pain Assessment Details Patient Name: SUYASH, AMORY 09/02/2016 12:30 Date of Service: PM Medical Record [id number] Number: Patient Account Number: [id number] Date of Birth/Sex: 08-01-1934 (82 y.o. Male) Treating RN: Angeline Slim, Other Clinician: Primary Care Curby Carswell: Sheppard Coil Treating Britto, Ishmael Holter, Kaylea Mounsey/Extender: Referring [name] [name]: Marlena Clipper in Treatment: 3 Active Problems Location of Pain Severity and Description of Pain Patient Has Paino No Site Locations Pain Management and Medication Current Pain Management: Electronic Signature(s) Signed: 09/02/2016 3:48:47 PM By: Alric Quan Entered By: Alric Quan on 09/02/2016 12:41:45 [name] ([id number]) -------------------------------------------------------------------------------- Patient/Caregiver Education Details Patient Name: ROGER, KETTLES 09/02/2016 12:30 Date of Service: PM Medical Record [id number] Number: Patient Account Number: [id number] Date of Birth/Gender: 1934/05/04 (82 y.o. Male) Treating RN: Carolyne Fiscal, Debi Scofield, Other Clinician: Physician: Sheppard Coil Treating Britto, Ishmael Holter, Physician/Extender: Referring Physician: Marlena Clipper in Treatment: 3 Education Assessment Education Provided To: Patient Education Topics Provided Electronic Signature(s) Signed: 09/02/2016 3:48:47 PM By: Alric Quan Entered By: Alric Quan on 09/02/2016 13:14:26 [name] ([id number]) -------------------------------------------------------------------------------- [place] Details Patient Name: KYLIN, DUBS 09/02/2016 12:30 Date of Service: PM Medical Record  [id number] Number: Patient Account Number: [id number] Date of Birth/Sex: [birth year] (82 y.o. Male) Treating RN: Angeline Slim, Other Clinician: Primary Care Abdirahman Chittum: Sheppard Coil Treating Britto, Ishmael Holter, Ho Parisi/Extender: Referring Pinky Ravan: Marlena Clipper in Treatment: 3 Vital Signs Time Taken: 12:41 Temperature (F): 97.6 Height (in): 72 Pulse (bpm): 63 Weight (lbs): 266.6 Respiratory Rate (breaths/min): 16 Body Mass Index (BMI): 36.2 Blood Pressure (mmHg): 135/76 Reference Range: 80 - 120 mg / dl Electronic Signature(s) Signed: 09/02/2016 3:48:47 PM By: Alric Quan Entered By: Alric Quan on 09/02/2016 12:49:08

## 2016-09-02 NOTE — Progress Notes (Addendum)
GARYSON, STELLY ([id number]) Visit Report for 09/02/2016 Chief Complaint Document Details Patient Name: Randy [name], Randy [name] 09/02/2016 12:30 Date of Service: PM Medical Record [id number] Number: Patient Account Number: [id number] Date of Birth/Sex: November 03, 1934 (82 y.o. Male) Treating RN: Angeline Slim, Other Clinician: Primary Care Provider: Sheppard Coil Treating Ousman Dise, Ishmael Holter, Provider/Extender: Referring Provider: Marlena Clipper in Treatment: 3 Information Obtained from: Patient Chief Complaint He is here for follow up evaluation for RLE edema, cellulitis Electronic Signature(s) Signed: 09/02/2016 2:50:55 PM By: Christin Fudge MD, FACS Entered By: Christin Fudge on 09/02/2016 13:34:02 JVION, TURGEON ([id number]) -------------------------------------------------------------------------------- HPI Details Patient Name: Randy [name], Randy [name] 09/02/2016 12:30 Date of Service: PM Medical Record [id number] Number: Patient Account Number: [id number] Date of Birth/Sex: 06/09/1934 (82 y.o. Male) Treating RN: Angeline Slim, Other Clinician: Primary Care Provider: Sheppard Coil Treating Aima Mcwhirt, Ishmael Holter, Provider/Extender: Referring Provider: Marlena Clipper in Treatment: 3 History of Present Illness Location: right leg more swollen than the left leg Quality: Patient reports experiencing heaviness to affected area(s). Severity: Patient states wound (s) are getting better. Duration: Patient has had the wound for > 3 months prior to seeking treatment at the wound center Timing: Pain in wound is Intermittent (comes and goes Context: The wound would happen gradually Modifying Factors: Other treatment(s) tried include:DVT workup and oral antibiotics Associated Signs and Symptoms: Patient reports having increase swelling. HPI Description: 82 year old gentleman who was recently referred to [place] by his PCP Dr. [name], a complex history which possibly could be  related to cellulitis and stasis dermatitis with unilateral swelling of his right lower extremity. He has initially been seen and given oral antibiotics for possible cellulitis and also worked up for a DVT in the ER which was found to be negative. His liquids which was started empirically was then stopped and he has been seen at various stages and put on oral antibiotics and Lasix. On the day he was last seen by his PCP he was evaluated and referred to the vascular surgeons and also to the wound care physicians. he was given injectable Rocephin and put on doxycycline and unfortunately the vascular surgery appointment was for longer than a month and hence he was referred to the wound care for possible compression. past medical history of hypertension, GERD, osteoarthritis, a pH with urinary problems and bilateral lower extremity lymphedema. He has never had a Doppler study done in the past and quit smoking several years ago. 08/19/16- he is here in follow-up evaluation for right lower extremity edema and cellulitis. He has completed his dose of doxycycline with essentially resolved erythema. He has had no significant change in the edema according to the patient. He states he is unable to place compression stockings on and therefore has had no form of compression applied since last follow-up. He does attempt leg elevation.he has an appointment with [facility] on 8/24 and 8/22 for reflux study and arterial studies. 09/02/2016 -- had low arterial studies done which showed the bilateral ABI suggest mild bilateral lower extremity arterial occlusive disease and the bilateral toe brachial indices were normal. The right ABI was 1.18 on the left ABI was 1.23 and the right ABI was 1.10 and the left was 1.13. he was seen by Dr. Lucky Cowboy on 08/30/2016 -- besides the normal arterial studies he had a venous reflux study which demonstrated bilateral deep venous reflux, great saphenous vein reflux and  right small saphenous vein reflux. No DVT or SVT was noted. after review he recommended to continue with the compression stockings and discussed  the role of laser ablation and other invasive therapies that may be of benefit going forward. He would return for review in 3 months. JOUSHUA, DUGAR ([id number]) Electronic Signature(s) Signed: 09/02/2016 2:50:55 PM By: Christin Fudge MD, FACS Entered By: Christin Fudge on 09/02/2016 13:34:14 Randy [name], Randy [name] ([id number]) -------------------------------------------------------------------------------- Physical Exam Details Patient Name: Randy [name], Randy [name] 09/02/2016 12:30 Date of Service: PM Medical Record [id number] Number: Patient Account Number: [id number] Date of Birth/Sex: 12/26/1934 (82 y.o. Male) Treating RN: Angeline Slim, Other Clinician: Primary Care Provider: Sheppard Coil Treating Brienne Liguori, Ishmael Holter, Provider/Extender: Referring Provider: Marlena Clipper in Treatment: 3 Constitutional . Pulse regular. Respirations normal and unlabored. Afebrile. . Eyes Nonicteric. Reactive to light. Ears, Nose, Mouth, and Throat Lips, teeth, and gums WNL.[name] Moist mucosa without lesions. Neck supple and nontender. No palpable supraclavicular or cervical adenopathy. Normal sized without goiter. Respiratory WNL. No retractions.. Cardiovascular Pedal Pulses WNL. No clubbing, cyanosis or edema. Lymphatic No adneopathy. No adenopathy. No adenopathy. Musculoskeletal Adexa without tenderness or enlargement.. Digits and nails w/o clubbing, cyanosis, infection, petechiae, ischemia, or inflammatory conditions.. Integumentary (Hair, Skin) No suspicious lesions. No crepitus or fluctuance. No peri-wound warmth or erythema. No masses.[name] Psychiatric Judgement and insight Intact.. No evidence of depression, anxiety, or agitation.. Notes the lymphedema on the right side is significant and the patient did not have any compression stockings  on today Electronic Signature(s) Signed: 09/02/2016 2:50:55 PM By: Christin Fudge MD, FACS Entered By: Christin Fudge on 09/02/2016 13:34:40 Randy [name] ([id number]) -------------------------------------------------------------------------------- Physician Orders Details Patient Name: Randy [name], Randy [name] 09/02/2016 12:30 Date of Service: PM Medical Record [id number] Number: Patient Account Number: [id number] Date of Birth/Sex: [birth year] (82 y.o. Male) Treating RN: Angeline Slim, Other Clinician: Primary Care Provider: Sheppard Coil Treating Vilas Edgerly, Ishmael Holter, Provider/Extender: Referring Provider: Marlena Clipper in Treatment: 3 Verbal / Phone Orders: No Diagnosis Coding Follow-up Appointments o Return Appointment in 1 week. Edema Control o Elevate legs to the level of the heart and pump ankles as often as possible o Other: - elevate legs while sleeping wear your compression stockings Additional Orders / Instructions o Other: - order dual layer compression stockings and juxtalite compression stockings Electronic Signature(s) Signed: 09/02/2016 2:50:55 PM By: Christin Fudge MD, FACS Signed: 09/02/2016 3:48:47 PM By: Alric Quan Entered By: Alric Quan on 09/02/2016 13:13:27 Randy [name], Randy [name] ([id number]) -------------------------------------------------------------------------------- Problem List Details Patient Name: MITCHELLE, GOERNER 09/02/2016 12:30 Date of Service: PM Medical Record [id number] Number: Patient Account Number: [id number] Date of Birth/Sex: Aug 26, 1934 (82 y.o. Male) Treating RN: Angeline Slim, Other Clinician: Primary Care Provider: Sheppard Coil Treating Antwaine Boomhower, Ishmael Holter, Provider/Extender: Referring Provider: Marlena Clipper in Treatment: 3 Active Problems ICD-10 Encounter Code Description Active Date Diagnosis I89.0 Lymphedema, not elsewhere classified 08/09/2016 Yes L03.115 Cellulitis of right lower limb 08/09/2016  Yes I87.303 Chronic venous hypertension (idiopathic) without 8/52/7782 Yes complications of bilateral lower extremity Inactive Problems Resolved Problems ICD-10 Code Description Active Date Resolved Date I73.9 Peripheral vascular disease, unspecified 08/09/2016 08/09/2016 Electronic Signature(s) Signed: 09/02/2016 1:36:01 PM By: Christin Fudge MD, FACS Entered By: Christin Fudge on 09/02/2016 13:36:01 Randy [name] ([id number]) -------------------------------------------------------------------------------- Progress Note Details Patient Name: Randy [name], Randy [name] 09/02/2016 12:30 Date of Service: PM Medical Record [id number] Number: Patient Account Number: [id number] Date of Birth/Sex: 11/25/1934 (82 y.o. Male) Treating RN: Angeline Slim, Other Clinician: Primary Care Provider: Sheppard Coil Treating Ori Trejos, Ishmael Holter, Provider/Extender: Referring Provider: Marlena Clipper in Treatment: 3 Subjective Chief Complaint Information obtained from Patient He is here for follow up evaluation for RLE edema, cellulitis History of Present Illness (HPI) The following HPI elements  were documented for the patient's wound: Location: right leg more swollen than the left leg Quality: Patient reports experiencing heaviness to affected area(s). Severity: Patient states wound (s) are getting better. Duration: Patient has had the wound for > 3 months prior to seeking treatment at the wound center Timing: Pain in wound is Intermittent (comes and goes Context: The wound would happen gradually Modifying Factors: Other treatment(s) tried include:DVT workup and oral antibiotics Associated Signs and Symptoms: Patient reports having increase swelling. 82 year old gentleman who was recently referred to [place] by his PCP Dr. [name], a complex history which possibly could be related to cellulitis and stasis dermatitis with unilateral swelling of his right lower extremity. He has initially been  seen and given oral antibiotics for possible cellulitis and also worked up for a DVT in the ER which was found to be negative. His liquids which was started empirically was then stopped and he has been seen at various stages and put on oral antibiotics and Lasix. On the day he was last seen by his PCP he was evaluated and referred to the vascular surgeons and also to the wound care physicians. he was given injectable Rocephin and put on doxycycline and unfortunately the vascular surgery appointment was for longer than a month and hence he was referred to the wound care for possible compression. past medical history of hypertension, GERD, osteoarthritis, a pH with urinary problems and bilateral lower extremity lymphedema. He has never had a Doppler study done in the past and quit smoking several years ago. 08/19/16- he is here in follow-up evaluation for right lower extremity edema and cellulitis. He has completed his dose of doxycycline with essentially resolved erythema. He has had no significant change in the edema according to the patient. He states he is unable to place compression stockings on and therefore has had no form of compression applied since last follow-up. He does attempt leg elevation.he has an appointment with [facility] on 8/24 and 8/22 for reflux study and arterial studies. 09/02/2016 -- had low arterial studies done which showed the bilateral ABI suggest mild bilateral lower extremity arterial occlusive disease and the bilateral toe brachial indices were normal. The right ABI was Randy [name], Randy [name] ([id number]) 1.18 on the left ABI was 1.23 and the right ABI was 1.10 and the left was 1.13. he was seen by Dr. Lucky Cowboy on 08/30/2016 -- besides the normal arterial studies he had a venous reflux study which demonstrated bilateral deep venous reflux, great saphenous vein reflux and right small saphenous vein reflux. No DVT or SVT was noted. after review he recommended to  continue with the compression stockings and discussed the role of laser ablation and other invasive therapies that may be of benefit going forward. He would return for review in 3 months. Objective Constitutional Pulse regular. Respirations normal and unlabored. Afebrile. Vitals Time Taken: 12:41 PM, Height: 72 in, Weight: 266.6 lbs, BMI: 36.2, Temperature: 97.6 F, Pulse: 63 bpm, Respiratory Rate: 16 breaths/min, Blood Pressure: 135/76 mmHg. Eyes Nonicteric. Reactive to light. Ears, Nose, Mouth, and Throat Lips, teeth, and gums WNL.[name] Moist mucosa without lesions. Neck supple and nontender. No palpable supraclavicular or cervical adenopathy. Normal sized without goiter. Respiratory WNL. No retractions.. Cardiovascular Pedal Pulses WNL. No clubbing, cyanosis or edema. Lymphatic No adneopathy. No adenopathy. No adenopathy. Musculoskeletal Adexa without tenderness or enlargement.. Digits and nails w/o clubbing, cyanosis, infection, petechiae, ischemia, or inflammatory conditions.[name] Psychiatric Judgement and insight Intact.. No evidence of depression, anxiety, or agitation.. General Notes:  the lymphedema on the right side is significant and the patient did not have any Randy [name], Randy [name] ([id number]) compression stockings on today Integumentary (Hair, Skin) No suspicious lesions. No crepitus or fluctuance. No peri-wound warmth or erythema. No masses.. Other Condition(s) Patient presents with Other Dermatologic Condition located on the Right Leg. The skin appearance exhibited: Dry/Scaly. The skin appearance did not exhibit: Atrophie Blanche, Callus, Crepitus, Cyanosis, Ecchymosis, Erythema, Excoriation, Friable, Hemosiderin Staining, Induration, Maceration, Mottled, Pallor, Rash, Rubor, Scarring. Skin temperature was noted as No Abnormality. Assessment Active Problems ICD-10 I89.0 - Lymphedema, not elsewhere classified L03.115 - Cellulitis of right lower limb I87.303 - Chronic venous  hypertension (idiopathic) without complications of bilateral lower extremity Plan Follow-up Appointments: Return Appointment in 1 week. Edema Control: Elevate legs to the level of the heart and pump ankles as often as possible Other: - elevate legs while sleeping wear your compression stockings Additional Orders / Instructions: Other: - order dual layer compression stockings and juxtalite compression stockings this 82 year old gentleman who has right unilateral lymphedema, had a vascular appointment which has been reviewed today and I have recommended: 1. Elevation and exercise, and compression with a 30-40 mm compression stocking. 2. now that his ABIs were normal we have recommended 30-40 mm compression stockings, and also juxta light compression stockings Randy [name], Randy [name] ([id number]) 3. He had a venous reflux study of both lower extremities and his vascular opinion was noted and he has a review in 3 months 4. Appropriate treatment with his diuretics as per his PCP I have answered all his questions and he will see [place] back regularly to monitor him has his compression stockings and wears them regularly Electronic Signature(s) Signed: 09/06/2016 3:31:36 PM By: Christin Fudge MD, FACS Previous Signature: 09/02/2016 2:50:55 PM Version By: Christin Fudge MD, FACS Entered By: Christin Fudge on 09/05/2016 16:31:01 Randy [name] ([id number]) -------------------------------------------------------------------------------- SuperBill Details Patient Name: Randy [name] Date of Service: 09/02/2016 Medical Record Number: [id number] Patient Account Number: [id number] Date of Birth/Sex: 07-02-1934 (82 y.o. Male) Treating RN: Carolyne Fiscal, Debi Primary Care Provider: [name] Other Clinician: Referring Provider: [name] Treating Provider/Extender: Frann Rider in Treatment: 3 Diagnosis Coding ICD-10 Codes Code Description I89.0 Lymphedema, not elsewhere classified L03.115  Cellulitis of right lower limb I87.303 Chronic venous hypertension (idiopathic) without complications of bilateral lower extremity Facility Procedures CPT4 Code: [id number] Description: 50569 - WOUND CARE VISIT-LEV 2 EST PT Modifier: Quantity: 1 Physician Procedures CPT4: Description Modifier Quantity Code 7948016 55374 - WC PHYS LEVEL 3 - EST PT 1 ICD-10 Description Diagnosis I89.0 Lymphedema, not elsewhere classified I87.303 Chronic venous hypertension (idiopathic) without complications of bilateral lower  extremity Electronic Signature(s) Signed: 09/02/2016 3:44:29 PM By: Christin Fudge MD, FACS Signed: 09/02/2016 3:48:47 PM By: Alric Quan Previous Signature: 09/02/2016 2:50:55 PM Version By: Christin Fudge MD, FACS Entered By: Alric Quan on 09/02/2016 15:41:38

## 2016-09-11 ENCOUNTER — Encounter: Payer: Self-pay | Admitting: Family Medicine

## 2016-09-11 ENCOUNTER — Ambulatory Visit (INDEPENDENT_AMBULATORY_CARE_PROVIDER_SITE_OTHER): Payer: Medicare Other | Admitting: Family Medicine

## 2016-09-11 VITALS — BP 138/78 | HR 70 | Temp 97.9°F | Resp 16 | Ht 72.0 in | Wt 224.6 lb

## 2016-09-11 DIAGNOSIS — Z23 Encounter for immunization: Secondary | ICD-10-CM

## 2016-09-11 DIAGNOSIS — I872 Venous insufficiency (chronic) (peripheral): Secondary | ICD-10-CM | POA: Diagnosis not present

## 2016-09-11 DIAGNOSIS — I10 Essential (primary) hypertension: Secondary | ICD-10-CM

## 2016-09-11 MED ORDER — ENALAPRIL MALEATE 20 MG PO TABS: 20.0000 mg | ORAL_TABLET | Freq: Every day | ORAL | 3 refills | Status: DC

## 2016-09-11 MED ORDER — ASPIRIN EC 81 MG PO TBEC: 81.0000 mg | DELAYED_RELEASE_TABLET | Freq: Every day | ORAL | Status: DC

## 2016-09-11 NOTE — Patient Instructions (Addendum)
Thank you for coming to the clinic today.  1. Keep up the great work with the compression stockings and elevation, to reduce swelling - Stay active, however if on feet too long can increase swelling  Follow-up as planned with [facility] Dr Lucky Cowboy  2. BP sounds slightly elevated still high 130s to 140 is a little too high, I would recommend doubling dose of Enalapril from 10 to 20mg , take 2 pills back to back in AM now until finished, then bring new printed rx to pharmacy for 20mg  pills once daily  We will HOLD off on adding new med Thiazide today that can reduce fluid  Only take Furosemide if needed for swelling, try to limit this use  3. Pneumonia vaccine, [id number] today then next due Pneumovax-23 in 1 year  Please schedule NURSE ONLY visit for Flu Shot in 2 weeks  Please schedule a Follow-up Appointment to: Return in about 3 months (around 12/11/2016) for blood pressure.  If you have any other questions or concerns, please feel free to call the clinic or send a message through [facility]. You may also schedule an earlier appointment if necessary.  Additionally, you may be receiving a survey about your experience at our clinic within a few days to 1 week by e-mail or mail. We value your feedback.  [name], DO [place]

## 2016-09-11 NOTE — Progress Notes (Signed)
Subjective:    Patient ID: Randy [name], male    DOB: [birth year], 82 y.o.   MRN: [id number]  Randy [name] is a 82 y.o. male presenting on 09/11/2016 for Hypertension   HPI   CHRONIC HTN - Last visit with me for HTN was 06/05/16, treated without significant change except advised to limit use of Furosemide going forward, see prior notes for background information. - Interval update with home BP readings remained stable but still slightly elevated 120-140s, usually higher 130s to 140s, he has not used Furosemide except maybe 1x since last visit, overall 1-2 pills in 4-6 months - Today patient reports no new concerns, feels good Current Meds - Enalapril 10mg  - Never on thiazide Reports good compliance, took BP med today. Tolerating well, w/o complaints. Lifestyle: - Diet: No changes - Exercise: increased walking now with less swelling, trying to improve regular exercise - He has started OTC ASA 81mg  daily and tolerating it well Denies CP, dyspnea, HA, dizziness / lightheadedness  VENOUS INSUFFICIENCY, Chronic b/l LE edema - Last visit with me 07/31/16 for this problem, treated with empiric coverage for cellulitis vs stasis ulcer, he had prior work-up for DVT recently that was negative, had been on some antibiotics with limited improvement, proceeded to CTX IM x 1 dose and then Doxycycline PO, also increased Lasix temporarily and referrals to [facility], see prior notes for background information. - Interval update with patient established with [facility] with improvement, multiple visits continues to follow-up next 9/7, and has established with Dr Lucky Cowboy ([facility]) has had other non invasive LE venous testing that confirmed significant reflux bilaterally as etiology of insufficiency, he has maximized compression stockings and improved lifestyle changes - Today patient reports overall significant improvement in swelling, pain, now walking more and feels  better - Wearing compression hose today - Still not using Furosemide as mentioned above - Denies any worsening swelling, redness, fever/chills, ulceration or leg pain  Health Maintenance: - Due for Flu Shot, agree will return in 2 weeks later in September to receive this - Due for Pneumonia vaccine, no prior record of this, start with Prevnar-13 today, return in 1 year for Pneumovax-23  Social History  Substance Use Topics  . Smoking status: Former Smoker    Packs/day: 1.00    Years: 12.00    Types: Cigarettes    Quit date: 1968  . Smokeless tobacco: Former Systems developer  . Alcohol use No    Review of Systems Per HPI unless specifically indicated above     Objective:    BP 138/78 (BP Location: Right Arm, Cuff Size: Normal)   Pulse 70   Temp 97.9 F (36.6 C) (Oral)   Resp 16   Ht 6' (1.829 m)   Wt 224 lb 9.6 oz (101.9 kg)   BMI 30.46 kg/m   Wt Readings from Last 3 Encounters:  09/11/16 224 lb 9.6 oz (101.9 kg)  08/30/16 225 lb (102.1 kg)  08/15/16 224 lb 11.2 oz (101.9 kg)    Physical Exam  Constitutional: He is oriented to person, place, and time. He appears well-developed and well-nourished. No distress.  Well-appearing, comfortable, cooperative  HENT:  Head: Normocephalic and atraumatic.  Mouth/Throat: Oropharynx is clear and moist.  Eyes: Conjunctivae are normal. Right eye exhibits no discharge. Left eye exhibits no discharge.  Neck: Normal range of motion. Neck supple.  Cardiovascular: Normal rate, regular rhythm, normal heart sounds and intact distal pulses.   No murmur  heard. Pulmonary/Chest: Effort normal.  Musculoskeletal: Normal range of motion. He exhibits edema (Significantly improved now +1 to trace in areas RLE edema, less on Left more normal, resolved erythema and abnormality from last visit).  Neurological: He is alert and oriented to person, place, and time.  Skin: Skin is warm and dry. No rash noted. He is not diaphoretic. No erythema.  Wearing  compression stockings bilateral  Psychiatric: He has a normal mood and affect. His behavior is normal.  Well groomed, good eye contact, normal speech and thoughts  Nursing note and vitals reviewed.   Results for orders placed or performed in visit on 08/15/16  Microscopic Examination  Result Value Ref Range   WBC, UA 0-5 0 - 5 /hpf   RBC, UA 0-2 0 - 2 /hpf   Epithelial Cells (non renal) None seen 0 - 10 /hpf   Mucus, UA Present (A) Not Estab.   Bacteria, UA None seen None seen/Few  Urinalysis, Complete  Result Value Ref Range   Specific Gravity, UA 1.015 1.005 - 1.030   pH, UA 7.0 5.0 - 7.5   Color, UA Yellow Yellow   Appearance Ur Clear Clear   Leukocytes, UA Negative Negative   Protein, UA Negative Negative/Trace   Glucose, UA Negative Negative   Ketones, UA Negative Negative   RBC, UA Negative Negative   Bilirubin, UA Negative Negative   Urobilinogen, Ur 0.2 0.2 - 1.0 mg/dL   Nitrite, UA Negative Negative   Microscopic Examination See below:       Assessment & Plan:   Problem List Items Addressed This Visit    Hypertension - Primary    Initial BP electronic reading elevated, manual repeat normal range. Home readings normal. No known complications  Plan: 1. Increase Enalapril to 20mg  daily - since not at goal BP 2. Hold adding any thiazide diuretic now with improved edema 3. Continue monitor BP at home, follow-up sooner if abnormal 4. Encourage start regular exercise 5. Follow-up 3 months HTN - future consider low dose thiazide for swelling      Relevant Medications   enalapril (VASOTEC) 20 MG tablet   aspirin EC 81 MG tablet   Chronic venous insufficiency    Improved on compression and conservative therapy, chronic venous insufficiency with reflux Followed by [facility], Dr Lucky Cowboy Recommend to continue current treatment Adjust ACEi today, will not add thiazide can consider in future Rarely uses Lasix      Relevant Medications   enalapril  (VASOTEC) 20 MG tablet   aspirin EC 81 MG tablet    Other Visit Diagnoses    Need for vaccination with 13-polyvalent pneumococcal conjugate vaccine       Relevant Orders   Pneumococcal conjugate vaccine 13-valent IM (Completed)      Meds ordered this encounter  Medications  . enalapril (VASOTEC) 20 MG tablet    Sig: Take 1 tablet (20 mg total) by mouth daily.    Dispense:  90 tablet    Refill:  3  . aspirin EC 81 MG tablet    Sig: Take 1 tablet (81 mg total) by mouth daily.    Follow up plan: Return in about 3 months (around 12/11/2016) for blood pressure.  [name], [facility] 09/12/2016, 5:15 PM

## 2016-09-12 NOTE — Assessment & Plan Note (Signed)
Improved on compression and conservative therapy, chronic venous insufficiency with reflux Followed by [facility], Dr Lucky Cowboy Recommend to continue current treatment Adjust ACEi today, will not add thiazide can consider in future Rarely uses Lasix

## 2016-09-12 NOTE — Assessment & Plan Note (Signed)
Initial BP electronic reading elevated, manual repeat normal range. Home readings normal. No known complications  Plan: 1. Increase Enalapril to 20mg  daily - since not at goal BP 2. Hold adding any thiazide diuretic now with improved edema 3. Continue monitor BP at home, follow-up sooner if abnormal 4. Encourage start regular exercise 5. Follow-up 3 months HTN - future consider low dose thiazide for swelling

## 2016-09-13 ENCOUNTER — Encounter: Payer: Medicare Other | Attending: Surgery | Admitting: Surgery

## 2016-09-13 DIAGNOSIS — Z79899 Other long term (current) drug therapy: Secondary | ICD-10-CM | POA: Diagnosis not present

## 2016-09-13 DIAGNOSIS — I87303 Chronic venous hypertension (idiopathic) without complications of bilateral lower extremity: Secondary | ICD-10-CM | POA: Insufficient documentation

## 2016-09-13 DIAGNOSIS — I890 Lymphedema, not elsewhere classified: Secondary | ICD-10-CM | POA: Insufficient documentation

## 2016-09-13 DIAGNOSIS — Z87891 Personal history of nicotine dependence: Secondary | ICD-10-CM | POA: Diagnosis not present

## 2016-09-13 DIAGNOSIS — L988 Other specified disorders of the skin and subcutaneous tissue: Secondary | ICD-10-CM | POA: Diagnosis not present

## 2016-09-13 DIAGNOSIS — L03115 Cellulitis of right lower limb: Secondary | ICD-10-CM | POA: Diagnosis not present

## 2016-09-13 DIAGNOSIS — K219 Gastro-esophageal reflux disease without esophagitis: Secondary | ICD-10-CM | POA: Insufficient documentation

## 2016-09-13 DIAGNOSIS — E785 Hyperlipidemia, unspecified: Secondary | ICD-10-CM | POA: Diagnosis not present

## 2016-09-13 DIAGNOSIS — M1990 Unspecified osteoarthritis, unspecified site: Secondary | ICD-10-CM | POA: Diagnosis not present

## 2016-09-13 DIAGNOSIS — I10 Essential (primary) hypertension: Secondary | ICD-10-CM | POA: Insufficient documentation

## 2016-09-13 DIAGNOSIS — M810 Age-related osteoporosis without current pathological fracture: Secondary | ICD-10-CM | POA: Diagnosis not present

## 2016-09-13 DIAGNOSIS — I739 Peripheral vascular disease, unspecified: Secondary | ICD-10-CM | POA: Insufficient documentation

## 2016-09-15 NOTE — Progress Notes (Signed)
Randy, [name] ([id number]) Visit Report for 09/13/2016 Arrival Information Details Patient Name: Randy [name], Randy [name] 09/13/2016 12:30 Date of Service: PM Medical Record [id number] Number: Patient Account Number: [id number] Date of Birth/Sex: Aug 24, 1934 (82 y.o. Male) Treating RN: Angeline Slim, Other Clinician: Primary Care Felesha Moncrieffe: Sheppard Coil Treating Britto, Ishmael Holter, Carle Dargan/Extender: Referring Venessa Wickham: Marlena Clipper in Treatment: 5 Visit Information History Since Last Visit All ordered tests and consults were completed: No Patient Arrived: Kasandra Knudsen Added or deleted any medications: No Arrival Time: 12:41 Any new allergies or adverse reactions: No Accompanied By: self Had a fall or experienced change in No Transfer Assistance: None activities of daily living that may affect Patient Identification Verified: Yes risk of falls: Secondary Verification Process Yes Signs or symptoms of abuse/neglect since last No Completed: visito Patient Requires Transmission- No Hospitalized since last visit: No Based Precautions: Pain Present Now: No Patient Has Alerts: Yes Patient Alerts: R ABI non- compressible L ABI non- compressible Electronic Signature(s) Signed: 09/13/2016 4:50:20 PM By: Alric Quan Entered By: Alric Quan on 09/13/2016 12:42:58 [name] ([id number]) -------------------------------------------------------------------------------- Clinic Level of Care Assessment Details Patient Name: Randy, [name] 09/13/2016 12:30 Date of Service: PM Medical Record [id number] Number: Patient Account Number: [id number] Date of Birth/Sex: 1934/02/25 (82 y.o. Male) Treating RN: Angeline Slim, Other Clinician: Primary Care Asuna Peth: Sheppard Coil Treating Britto, Ishmael Holter, Kamori Barbier/Extender: Referring Kasiya Burck: Marlena Clipper in Treatment: 5 Clinic Level of Care Assessment Items TOOL 4 Quantity Score X - Use when only an EandM is  performed on FOLLOW-UP visit 1 0 ASSESSMENTS - Nursing Assessment / Reassessment []  - Reassessment of Co-morbidities (includes updates in patient status) 0 []  - Reassessment of Adherence to Treatment Plan 0 ASSESSMENTS - Wound and Skin Assessment / Reassessment []  - Simple Wound Assessment / Reassessment - one wound 0 []  - Complex Wound Assessment / Reassessment - multiple wounds 0 []  - Dermatologic / Skin Assessment (not related to wound area) 0 ASSESSMENTS - Focused Assessment X - Circumferential Edema Measurements - multi extremities 2 5 []  - Nutritional Assessment / Counseling / Intervention 0 []  - Lower Extremity Assessment (monofilament, tuning fork, pulses) 0 []  - Peripheral Arterial Disease Assessment (using hand held doppler) 0 ASSESSMENTS - Ostomy and/or Continence Assessment and Care []  - Incontinence Assessment and Management 0 []  - Ostomy Care Assessment and Management (repouching, etc.) 0 PROCESS - Coordination of Care X - Simple Patient / Family Education for ongoing care 1 15 []  - Complex (extensive) Patient / Family Education for ongoing care 0 []  - Staff obtains Consents, Records, Test Results / Process Orders 0 CEYLON, ARENSON ([id number]) []  - Staff telephones HHA, Nursing Homes / Clarify orders / etc 0 []  - Routine Transfer to another Facility (non-emergent condition) 0 []  - Routine Hospital Admission (non-emergent condition) 0 []  - New Admissions / Biomedical engineer / Ordering NPWT, Apligraf, etc. 0 []  - Emergency Hospital Admission (emergent condition) 0 X - Simple Discharge Coordination 1 10 []  - Complex (extensive) Discharge Coordination 0 PROCESS - Special Needs []  - Pediatric / Minor Patient Management 0 []  - Isolation Patient Management 0 []  - Hearing / Language / Visual special needs 0 []  - Assessment of Community assistance (transportation, D/C planning, etc.) 0 []  - Additional assistance / Altered mentation 0 []  - Support Surface(s) Assessment (bed,  cushion, seat, etc.) 0 INTERVENTIONS - Wound Cleansing / Measurement []  - Simple Wound Cleansing - one wound 0 []  - Complex Wound Cleansing - multiple wounds 0 X - Wound Imaging (photographs - any number of wounds)  1 5 []  - Wound Tracing (instead of photographs) 0 []  - Simple Wound Measurement - one wound 0 []  - Complex Wound Measurement - multiple wounds 0 INTERVENTIONS - Wound Dressings []  - Small Wound Dressing one or multiple wounds 0 []  - Medium Wound Dressing one or multiple wounds 0 []  - Large Wound Dressing one or multiple wounds 0 []  - Application of Medications - topical 0 []  - Application of Medications - injection 0 [name], [name] ([id number]) INTERVENTIONS - Miscellaneous []  - External ear exam 0 []  - Specimen Collection (cultures, biopsies, blood, body fluids, etc.) 0 []  - Specimen(s) / Culture(s) sent or taken to Lab for analysis 0 []  - Patient Transfer (multiple staff / Harrel Lemon Lift / Similar devices) 0 []  - Simple Staple / Suture removal (25 or less) 0 []  - Complex Staple / Suture removal (26 or more) 0 []  - Hypo / Hyperglycemic Management (close monitor of Blood Glucose) 0 []  - Ankle / Brachial Index (ABI) - do not check if billed separately 0 X - Vital Signs 1 5 Has the patient been seen at the hospital within the last three years: Yes Total Score: 45 Level Of Care: New/Established - Level 2 Electronic Signature(s) Signed: 09/13/2016 4:50:20 PM By: Alric Quan Entered By: Alric Quan on 09/13/2016 13:34:10 [name] ([id number]) -------------------------------------------------------------------------------- Encounter Discharge Information Details Patient Name: Randy, [name] 09/13/2016 12:30 Date of Service: PM Medical Record [id number] Number: Patient Account Number: [id number] Date of Birth/Sex: 06-04-1934 (82 y.o. Male) Treating RN: Angeline Slim, Other Clinician: Primary Care Shada Nienaber: Sheppard Coil Treating Britto, Ishmael Holter,  Saraiyah Hemminger/Extender: Referring Dail Lerew: Marlena Clipper in Treatment: 5 Encounter Discharge Information Items Discharge Pain Level: 0 Discharge Condition: Stable Ambulatory Status: Cane Discharge Destination: Home Private Transportation: Auto Accompanied By: self Schedule Follow-up Appointment: No Medication Reconciliation completed and No provided to Patient/Care Alayza Pieper: Clinical Summary of Care: Electronic Signature(s) Signed: 09/13/2016 4:50:20 PM By: Alric Quan Entered By: Alric Quan on 09/13/2016 13:02:14 [name] ([id number]) -------------------------------------------------------------------------------- Lower Extremity Assessment Details Patient Name: LEAVY, HEATHERLY 09/13/2016 12:30 Date of Service: PM Medical Record [id number] Number: Patient Account Number: [id number] Date of Birth/Sex: 25-Nov-1934 (82 y.o. Male) Treating RN: Angeline Slim, Other Clinician: Primary Care Anayi Bricco: Sheppard Coil Treating Britto, Ishmael Holter, Federico Maiorino/Extender: Referring Dade Rodin: Marlena Clipper in Treatment: 5 Edema Assessment Assessed: [Left: No] [Right: No] E[Left: dema] [Right: :] Calf Left: Right: Point of Measurement: 36 cm From Medial Instep 40.3 cm 41.7 cm Ankle Left: Right: Point of Measurement: 10 cm From Medial Instep 24.8 cm 26 cm Vascular Assessment Pulses: Dorsalis Pedis Palpable: [Left:Yes] [Right:Yes] Posterior Tibial Extremity colors, hair growth, and conditions: Extremity Color: [Left:Normal] [Right:Red] Temperature of Extremity: [Left:Warm] [Right:Warm] Capillary Refill: [Left:< 3 seconds] [Right:< 3 seconds] Toe Nail Assessment Left: Right: Thick: No No Discolored: No No Deformed: No No Improper Length and Hygiene: No No Electronic Signature(s) Signed: 09/13/2016 4:50:20 PM By: Alric Quan Entered By: Alric Quan on 09/13/2016 12:50:45 YIDEL, TEUSCHER ([id number]) [place], Jeneen Rinks  ([id number]) -------------------------------------------------------------------------------- Multi Wound Chart Details Patient Name: AIVEN, KAMPE 09/13/2016 12:30 Date of Service: PM Medical Record [id number] Number: Patient Account Number: [id number] Date of Birth/Sex: 11/04/1934 (82 y.o. Male) Treating RN: Angeline Slim, Other Clinician: Primary Care Nicholette Dolson: Sheppard Coil Treating Britto, Ishmael Holter, Author [place]/Extender: Referring Reve Crocket: Marlena Clipper in Treatment: 5 Vital Signs Height(in): 72 Pulse(bpm): 62 Weight(lbs): 266.6 Blood Pressure 134/70 (mmHg): Body Mass Index(BMI): 36 Temperature(F): 97.7 Respiratory Rate 16 (breaths/min): Wound Assessments Treatment Notes Electronic Signature(s) Signed: 09/13/2016 3:48:14 PM By: Christin Fudge MD, FACS Entered By:  Christin Fudge on 09/13/2016 12:58:00 DIA, JEFFERYS ([id number]) -------------------------------------------------------------------------------- Multi-Disciplinary Care Plan Details Patient Name: LAWERNCE, EARLL 09/13/2016 12:30 Date of Service: PM Medical Record [id number] Number: Patient Account Number: [id number] Date of Birth/Sex: 1934-12-13 (82 y.o. Male) Treating RN: Angeline Slim, Other Clinician: Primary Care Saraya Tirey: Sheppard Coil Treating Britto, Ishmael Holter, Josha Weekley/Extender: Referring Shaden Lacher: Marlena Clipper in Treatment: 5 Active Inactive Electronic Signature(s) Signed: 09/13/2016 4:50:20 PM By: Alric Quan Entered By: Alric Quan on 09/13/2016 13:02:33 RASHIDI, LOH ([id number]) -------------------------------------------------------------------------------- Non-Wound Condition Assessment Details Patient Name: ESAIAS, CLEAVENGER 09/13/2016 12:30 Date of Service: PM Medical Record [id number] Number: Patient Account Number: [id number] Date of Birth/Sex: 12-12-1934 (82 y.o. Male) Treating RN: Angeline Slim, Other Clinician: Primary Care  Hassani Sliney: Sheppard Coil Treating Britto, Ishmael Holter, [place] Becvar/Extender: Referring Salim Forero: Marlena Clipper in Treatment: 5 Non-Wound Condition: Condition: Other Dermatologic Condition Location: Leg Side: Right Photos Periwound Skin Texture Texture Color No Abnormalities Noted: No No Abnormalities Noted: No Callus: No Atrophie Blanche: No Crepitus: No Cyanosis: No Excoriation: No Ecchymosis: No Friable: No Erythema: No Induration: No Hemosiderin Staining: No Rash: No Mottled: No Scarring: No Pallor: No Rubor: No Moisture No Abnormalities Noted: No Temperature / Pain Dry / Scaly: Yes Temperature: No Abnormality Maceration: No Electronic Signature(s) Signed: 09/13/2016 4:50:20 PM By: Alric Quan Entered By: Alric Quan on 09/13/2016 13:13:11 [name] ([id number]) DANG, MATHISON ([id number]) -------------------------------------------------------------------------------- Pain Assessment Details Patient Name: JASTEN, GUYETTE 09/13/2016 12:30 Date of Service: PM Medical Record [id number] Number: Patient Account Number: [id number] Date of Birth/Sex: May 13, 1934 (82 y.o. Male) Treating RN: Angeline Slim, Other Clinician: Primary Care Aleanna Menge: Sheppard Coil Treating Britto, Ishmael Holter, Welby Montminy/Extender: Referring Ellinor Test: Marlena Clipper in Treatment: 5 Active Problems Location of Pain Severity and Description of Pain Patient Has Paino No Site Locations Pain Management and Medication Current Pain Management: Electronic Signature(s) Signed: 09/13/2016 4:50:20 PM By: Alric Quan Entered By: Alric Quan on 09/13/2016 12:43:07 [name] ([id number]) -------------------------------------------------------------------------------- Patient/Caregiver Education Details Patient Name: LATOYA, DISKIN 09/13/2016 12:30 Date of Service: PM Medical Record [id number] Number: Patient Account Number: [id number] Date of Birth/Gender: 1934/10/04  (82 y.o. Male) Treating RN: Carolyne Fiscal, Debi Primary Care KARAMALEGOS, Other Clinician: Physician: Sheppard Coil Treating Britto, Ishmael Holter, Physician/Extender: Referring Physician: Marlena Clipper in Treatment: 5 Education Assessment Education Provided To: Patient Education Topics Provided Wound/Skin Impairment: Handouts: Other: Please call our office if you have any questions or concerns. Electronic Signature(s) Signed: 09/13/2016 4:50:20 PM By: Alric Quan Entered By: Alric Quan on 09/13/2016 13:02:22 MIKLOS, BIDINGER ([id number]) -------------------------------------------------------------------------------- [place] Details Patient Name: HARDEEP, REETZ 09/13/2016 12:30 Date of Service: PM Medical Record [id number] Number: Patient Account Number: [id number] Date of Birth/Sex: [birth year] (82 y.o. Male) Treating RN: Angeline Slim, Other Clinician: Primary Care Adelae Yodice: Sheppard Coil Treating Britto, Ishmael Holter, Saroya Riccobono/Extender: Referring Kharon Hixon: Marlena Clipper in Treatment: 5 Vital Signs Time Taken: 12:43 Temperature (F): 97.7 Height (in): 72 Pulse (bpm): 62 Weight (lbs): 266.6 Respiratory Rate (breaths/min): 16 Body Mass Index (BMI): 36.2 Blood Pressure (mmHg): 134/70 Reference Range: 80 - 120 mg / dl Electronic Signature(s) Signed: 09/13/2016 4:50:20 PM By: Alric Quan Entered By: Alric Quan on 09/13/2016 12:45:55

## 2016-09-15 NOTE — Progress Notes (Signed)
Randy [name], Randy [name] ([id number]) Visit Report for 09/13/2016 Chief Complaint Document Details Patient Name: Randy [name], Randy [name] 09/13/2016 12:30 Date of Service: PM Medical Record [id number] Number: Patient Account Number: [id number] Date of Birth/Sex: 04-Jan-1935 (82 y.o. Male) Treating RN: Angeline Slim, Other Clinician: Primary Care Provider: Sheppard Coil Treating Jayon Matton, Ishmael Holter, Provider/Extender: Referring Provider: Marlena Clipper in Treatment: 5 Information Obtained from: Patient Chief Complaint He is here for follow up evaluation for RLE edema, cellulitis Electronic Signature(s) Signed: 09/13/2016 3:48:14 PM By: Christin Fudge MD, FACS Entered By: Christin Fudge on 09/13/2016 12:58:18 Randy [name], Randy [name] ([id number]) -------------------------------------------------------------------------------- HPI Details Patient Name: Randy [name] 09/13/2016 12:30 Date of Service: PM Medical Record [id number] Number: Patient Account Number: [id number] Date of Birth/Sex: 02-18-1934 (82 y.o. Male) Treating RN: Angeline Slim, Other Clinician: Primary Care Provider: Sheppard Coil Treating Ritesh Opara, Ishmael Holter, Provider/Extender: Referring Provider: Marlena Clipper in Treatment: 5 History of Present Illness Location: right leg more swollen than the left leg Quality: Patient reports experiencing heaviness to affected area(s). Severity: Patient states wound (s) are getting better. Duration: Patient has had the wound for > 3 months prior to seeking treatment at the wound center Timing: Pain in wound is Intermittent (comes and goes Context: The wound would happen gradually Modifying Factors: Other treatment(s) tried include:DVT workup and oral antibiotics Associated Signs and Symptoms: Patient reports having increase swelling. HPI Description: 82 year old gentleman who was recently referred to [place] by his PCP Dr. [name], a complex history which possibly could be  related to cellulitis and stasis dermatitis with unilateral swelling of his right lower extremity. He has initially been seen and given oral antibiotics for possible cellulitis and also worked up for a DVT in the ER which was found to be negative. His liquids which was started empirically was then stopped and he has been seen at various stages and put on oral antibiotics and Lasix. On the day he was last seen by his PCP he was evaluated and referred to the vascular surgeons and also to the wound care physicians. he was given injectable Rocephin and put on doxycycline and unfortunately the vascular surgery appointment was for longer than a month and hence he was referred to the wound care for possible compression. past medical history of hypertension, GERD, osteoarthritis, a pH with urinary problems and bilateral lower extremity lymphedema. He has never had a Doppler study done in the past and quit smoking several years ago. 08/19/16- he is here in follow-up evaluation for right lower extremity edema and cellulitis. He has completed his dose of doxycycline with essentially resolved erythema. He has had no significant change in the edema according to the patient. He states he is unable to place compression stockings on and therefore has had no form of compression applied since last follow-up. He does attempt leg elevation.he has an appointment with [facility] on 8/24 and 8/22 for reflux study and arterial studies. 09/02/2016 -- had low arterial studies done which showed the bilateral ABI suggest mild bilateral lower extremity arterial occlusive disease and the bilateral toe brachial indices were normal. The right ABI was 1.18 on the left ABI was 1.23 and the right ABI was 1.10 and the left was 1.13. he was seen by Dr. Lucky Cowboy on 08/30/2016 -- besides the normal arterial studies he had a venous reflux study which demonstrated bilateral deep venous reflux, great saphenous vein reflux and  right small saphenous vein reflux. No DVT or SVT was noted. after review he recommended to continue with the compression stockings and discussed  the role of laser ablation and other invasive therapies that may be of benefit going forward. He would return for review in 3 months. Randy [name], Randy [name] ([id number]) Electronic Signature(s) Signed: 09/13/2016 3:48:14 PM By: Christin Fudge MD, FACS Entered By: Christin Fudge on 09/13/2016 12:58:24 Randy [name], Randy [name] ([id number]) -------------------------------------------------------------------------------- Physical Exam Details Patient Name: Randy [name], Randy [name] 09/13/2016 12:30 Date of Service: PM Medical Record [id number] Number: Patient Account Number: [id number] Date of Birth/Sex: 07-Apr-1934 (82 y.o. Male) Treating RN: Angeline Slim, Other Clinician: Primary Care Provider: Sheppard Coil Treating Atiyana Welte, Ishmael Holter, Provider/Extender: Referring Provider: Marlena Clipper in Treatment: 5 Constitutional . Pulse regular. Respirations normal and unlabored. Afebrile. . Eyes Nonicteric. Reactive to light. Ears, Nose, Mouth, and Throat Lips, teeth, and gums WNL.[name] Moist mucosa without lesions. Neck supple and nontender. No palpable supraclavicular or cervical adenopathy. Normal sized without goiter. Respiratory WNL. No retractions.. Cardiovascular Pedal Pulses WNL. No clubbing, cyanosis or edema. Lymphatic No adneopathy. No adenopathy. No adenopathy. Musculoskeletal Adexa without tenderness or enlargement.. Digits and nails w/o clubbing, cyanosis, infection, petechiae, ischemia, or inflammatory conditions.. Integumentary (Hair, Skin) No suspicious lesions. No crepitus or fluctuance. No peri-wound warmth or erythema. No masses.[name] Psychiatric Judgement and insight Intact.. No evidence of depression, anxiety, or agitation.. Notes both lower extremities look very good and the lymphedema is much better controlled bilaterally. There are no open  ulcerations. Electronic Signature(s) Signed: 09/13/2016 3:48:14 PM By: Christin Fudge MD, FACS Entered By: Christin Fudge on 09/13/2016 12:59:53 Randy [name], Randy [name] ([id number]) -------------------------------------------------------------------------------- Physician Orders Details Patient Name: Randy [name], Randy [name] 09/13/2016 12:30 Date of Service: PM Medical Record [id number] Number: Patient Account Number: [id number] Date of Birth/Sex: 08/30/1934 (82 y.o. Male) Treating RN: Angeline Slim, Other Clinician: Primary Care Provider: Sheppard Coil Treating Khalise Billard, Ishmael Holter, Provider/Extender: Referring Provider: Marlena Clipper in Treatment: 5 Verbal / Phone Orders: No Diagnosis Coding ICD-10 Coding Code Description I89.0 Lymphedema, not elsewhere classified L03.115 Cellulitis of right lower limb I87.303 Chronic venous hypertension (idiopathic) without complications of bilateral lower extremity Discharge From [facility] Services o Discharge from [place] your compression stockings daily. Please call our office if you have any questions or concerns. Electronic Signature(s) Signed: 09/13/2016 3:48:14 PM By: Christin Fudge MD, FACS Signed: 09/13/2016 4:50:20 PM By: Alric Quan Entered By: Alric Quan on 09/13/2016 13:01:53 Randy [name], Randy [name] ([id number]) -------------------------------------------------------------------------------- Problem List Details Patient Name: Randy [name], Randy [name] 09/13/2016 12:30 Date of Service: PM Medical Record [id number] Number: Patient Account Number: [id number] Date of Birth/Sex: [birth year] (82 y.o. Male) Treating RN: Angeline Slim, Other Clinician: Primary Care Provider: Sheppard Coil Treating Jeremy Ditullio, Ishmael Holter, Provider/Extender: Referring Provider: Marlena Clipper in Treatment: 5 Active Problems ICD-10 Encounter Code Description Active Date Diagnosis I89.0 Lymphedema, not elsewhere classified 08/09/2016 Yes L03.115  Cellulitis of right lower limb 08/09/2016 Yes I87.303 Chronic venous hypertension (idiopathic) without 02/03/5168 Yes complications of bilateral lower extremity Inactive Problems Resolved Problems ICD-10 Code Description Active Date Resolved Date I73.9 Peripheral vascular disease, unspecified 08/09/2016 08/09/2016 Electronic Signature(s) Signed: 09/13/2016 3:48:14 PM By: Christin Fudge MD, FACS Entered By: Christin Fudge on 09/13/2016 12:57:55 Randy [name] ([id number]) -------------------------------------------------------------------------------- Progress Note Details Patient Name: Randy [name], Randy [name] 09/13/2016 12:30 Date of Service: PM Medical Record [id number] Number: Patient Account Number: [id number] Date of Birth/Sex: 06/04/1934 (82 y.o. Male) Treating RN: Angeline Slim, Other Clinician: Primary Care Provider: Sheppard Coil Treating Lucrecia Mcphearson, Ishmael Holter, Provider/Extender: Referring Provider: Marlena Clipper in Treatment: 5 Subjective Chief Complaint Information obtained from Patient He is here for follow up evaluation for RLE edema, cellulitis History of Present Illness (HPI) The following HPI elements  were documented for the patient's wound: Location: right leg more swollen than the left leg Quality: Patient reports experiencing heaviness to affected area(s). Severity: Patient states wound (s) are getting better. Duration: Patient has had the wound for > 3 months prior to seeking treatment at the wound center Timing: Pain in wound is Intermittent (comes and goes Context: The wound would happen gradually Modifying Factors: Other treatment(s) tried include:DVT workup and oral antibiotics Associated Signs and Symptoms: Patient reports having increase swelling. 82 year old gentleman who was recently referred to [place] by his PCP Dr. [name], a complex history which possibly could be related to cellulitis and stasis dermatitis with unilateral swelling of his  right lower extremity. He has initially been seen and given oral antibiotics for possible cellulitis and also worked up for a DVT in the ER which was found to be negative. His liquids which was started empirically was then stopped and he has been seen at various stages and put on oral antibiotics and Lasix. On the day he was last seen by his PCP he was evaluated and referred to the vascular surgeons and also to the wound care physicians. he was given injectable Rocephin and put on doxycycline and unfortunately the vascular surgery appointment was for longer than a month and hence he was referred to the wound care for possible compression. past medical history of hypertension, GERD, osteoarthritis, a pH with urinary problems and bilateral lower extremity lymphedema. He has never had a Doppler study done in the past and quit smoking several years ago. 08/19/16- he is here in follow-up evaluation for right lower extremity edema and cellulitis. He has completed his dose of doxycycline with essentially resolved erythema. He has had no significant change in the edema according to the patient. He states he is unable to place compression stockings on and therefore has had no form of compression applied since last follow-up. He does attempt leg elevation.he has an appointment with [facility] on 8/24 and 8/22 for reflux study and arterial studies. 09/02/2016 -- had low arterial studies done which showed the bilateral ABI suggest mild bilateral lower extremity arterial occlusive disease and the bilateral toe brachial indices were normal. The right ABI was Randy [name], Randy [name] ([id number]) 1.18 on the left ABI was 1.23 and the right ABI was 1.10 and the left was 1.13. he was seen by Dr. Lucky Cowboy on 08/30/2016 -- besides the normal arterial studies he had a venous reflux study which demonstrated bilateral deep venous reflux, great saphenous vein reflux and right small saphenous vein reflux. No DVT or SVT  was noted. after review he recommended to continue with the compression stockings and discussed the role of laser ablation and other invasive therapies that may be of benefit going forward. He would return for review in 3 months. Objective Constitutional Pulse regular. Respirations normal and unlabored. Afebrile. Vitals Time Taken: 12:43 PM, Height: 72 in, Weight: 266.6 lbs, BMI: 36.2, Temperature: 97.7 F, Pulse: 62 bpm, Respiratory Rate: 16 breaths/min, Blood Pressure: 134/70 mmHg. Eyes Nonicteric. Reactive to light. Ears, Nose, Mouth, and Throat Lips, teeth, and gums WNL.[name] Moist mucosa without lesions. Neck supple and nontender. No palpable supraclavicular or cervical adenopathy. Normal sized without goiter. Respiratory WNL. No retractions.. Cardiovascular Pedal Pulses WNL. No clubbing, cyanosis or edema. Lymphatic No adneopathy. No adenopathy. No adenopathy. Musculoskeletal Adexa without tenderness or enlargement.. Digits and nails w/o clubbing, cyanosis, infection, petechiae, ischemia, or inflammatory conditions.[name] Psychiatric Judgement and insight Intact.. No evidence of depression, anxiety, or agitation.. General Notes:  both lower extremities look very good and the lymphedema is much better controlled KYRIE, FLUDD ([id number]) bilaterally. There are no open ulcerations. Integumentary (Hair, Skin) No suspicious lesions. No crepitus or fluctuance. No peri-wound warmth or erythema. No masses.. Other Condition(s) Patient presents with Other Dermatologic Condition located on the Right Leg. The skin appearance exhibited: Dry/Scaly. The skin appearance did not exhibit: Atrophie Blanche, Callus, Crepitus, Cyanosis, Ecchymosis, Erythema, Excoriation, Friable, Hemosiderin Staining, Induration, Maceration, Mottled, Pallor, Rash, Rubor, Scarring. Skin temperature was noted as No Abnormality. Assessment Active Problems ICD-10 I89.0 - Lymphedema, not elsewhere classified L03.115 -  Cellulitis of right lower limb I87.303 - Chronic venous hypertension (idiopathic) without complications of bilateral lower extremity Plan Discharge From [facility] Services: Discharge from [place] your compression stockings daily. Please call our office if you have any questions or concerns. the patient could not afford his juxta lites stockings as they were not covered by his insurance company. However he does have 20-30 mm compression stockings. I have recommended: 1. Elevation and exercise, and compression with a 30-40 mm compression stocking. 2. he will see the vascular surgeon back after 3 months 3. Appropriate treatment with his diuretics as per his PCP Randy [name] ([id number]) I have answered all his questions and he will see [place] back regularly to monitor him has his compression stockings and wears them regularly he is discharged on the wound care services and will be seen back only if the need arises Electronic Signature(s) Signed: 09/13/2016 4:29:57 PM By: Christin Fudge MD, FACS Previous Signature: 09/13/2016 3:48:14 PM Version By: Christin Fudge MD, FACS Entered By: Christin Fudge on 09/13/2016 15:51:09 Randy [name] ([id number]) -------------------------------------------------------------------------------- SuperBill Details Patient Name: Randy [name] Date of Service: 09/13/2016 Medical Record Number: [id number] Patient Account Number: [id number] Date of Birth/Sex: [birth year] (82 y.o. Male) Treating RN: Carolyne Fiscal, Debi Primary Care Provider: [name] Other Clinician: Referring Provider: [name] Treating Provider/Extender: Frann Rider in Treatment: 5 Diagnosis Coding ICD-10 Codes Code Description I89.0 Lymphedema, not elsewhere classified L03.115 Cellulitis of right lower limb I87.303 Chronic venous hypertension (idiopathic) without complications of bilateral lower extremity Facility Procedures CPT4 Code: [id number] Description: 67209 -  WOUND CARE VISIT-LEV 2 EST PT Modifier: Quantity: 1 Physician Procedures CPT4: Description Modifier Quantity Code 4709628 36629 - WC PHYS LEVEL 2 - EST PT 1 ICD-10 Description Diagnosis I89.0 Lymphedema, not elsewhere classified L03.115 Cellulitis of right lower limb I87.303 Chronic venous hypertension (idiopathic) without  complications of bilateral lower extremity Electronic Signature(s) Signed: 09/13/2016 3:48:14 PM By: Christin Fudge MD, FACS Signed: 09/13/2016 4:50:20 PM By: Alric Quan Entered By: Alric Quan on 09/13/2016 13:34:17

## 2016-10-01 ENCOUNTER — Ambulatory Visit (INDEPENDENT_AMBULATORY_CARE_PROVIDER_SITE_OTHER): Payer: Medicare Other

## 2016-10-01 ENCOUNTER — Encounter: Payer: Self-pay | Admitting: Family Medicine

## 2016-10-01 DIAGNOSIS — M25569 Pain in unspecified knee: Secondary | ICD-10-CM | POA: Insufficient documentation

## 2016-10-01 DIAGNOSIS — Z23 Encounter for immunization: Secondary | ICD-10-CM

## 2016-10-01 DIAGNOSIS — G8929 Other chronic pain: Secondary | ICD-10-CM | POA: Insufficient documentation

## 2016-11-16 ENCOUNTER — Other Ambulatory Visit: Payer: Self-pay | Admitting: Family Medicine

## 2016-11-16 DIAGNOSIS — K219 Gastro-esophageal reflux disease without esophagitis: Secondary | ICD-10-CM

## 2016-11-26 ENCOUNTER — Ambulatory Visit (INDEPENDENT_AMBULATORY_CARE_PROVIDER_SITE_OTHER): Payer: Medicare Other

## 2016-11-26 VITALS — BP 138/78 | HR 64 | Temp 97.6°F | Ht 72.0 in | Wt 228.6 lb

## 2016-11-26 DIAGNOSIS — Z0000 Encounter for general adult medical examination without abnormal findings: Secondary | ICD-10-CM

## 2016-11-26 NOTE — Patient Instructions (Signed)
Randy [name] , Thank you for taking time to come for your Medicare Wellness Visit. I appreciate your ongoing commitment to your health goals. Please review the following plan we discussed and let me know if I can assist you in the future.   Screening recommendations/referrals: Colonoscopy: no longer required Recommended yearly ophthalmology/optometry visit for glaucoma screening and checkup Recommended yearly dental visit for hygiene and checkup  Vaccinations: Influenza vaccine: up to date Pneumococcal vaccine: pneumovax 23 due 09/11/2017 Tdap vaccine: up to date Shingles vaccine: due, check with your insurance company for coverage    Advanced directives: Please bring a copy of your health care power of attorney and living will to the office at your convenience.  Conditions/risks identified: Recommend drinking at least 4-5 glasses of water a day   Next appointment: Follow up on 12/11/2016 at 1:20pm with Dr. Parks Ranger. Follow up in one year for your annual wellness exam.   Preventive Care 65 Years and Older, Male Preventive care refers to lifestyle choices and visits with your health care provider that can promote health and wellness. What does preventive care include?  A yearly physical exam. This is also called an annual well check.  Dental exams once or twice a year.  Routine eye exams. Ask your health care provider how often you should have your eyes checked.  Personal lifestyle choices, including:  Daily care of your teeth and gums.  Regular physical activity.  Eating a healthy diet.  Avoiding tobacco and drug use.  Limiting alcohol use.  Practicing safe sex.  Taking low doses of aspirin every day.  Taking vitamin and mineral supplements as recommended by your health care provider. What happens during an annual well check? The services and screenings done by your health care provider during your annual well check will depend on your age, overall health, lifestyle risk  factors, and family history of disease. Counseling  Your health care provider may ask you questions about your:  Alcohol use.  Tobacco use.  Drug use.  Emotional well-being.  Home and relationship well-being.  Sexual activity.  Eating habits.  History of falls.  Memory and ability to understand (cognition).  Work and work Statistician. Screening  You may have the following tests or measurements:  Height, weight, and BMI.  Blood pressure.  Lipid and cholesterol levels. These may be checked every 5 years, or more frequently if you are over 54 years old.  Skin check.  Lung cancer screening. You may have this screening every year starting at age 2 if you have a 30-pack-year history of smoking and currently smoke or have quit within the past 15 years.  Fecal occult blood test (FOBT) of the stool. You may have this test every year starting at age 29.  Flexible sigmoidoscopy or colonoscopy. You may have a sigmoidoscopy every 5 years or a colonoscopy every 10 years starting at age 41.  Prostate cancer screening. Recommendations will vary depending on your family history and other risks.  Hepatitis C blood test.  Hepatitis B blood test.  Sexually transmitted disease (STD) testing.  Diabetes screening. This is done by checking your blood sugar (glucose) after you have not eaten for a while (fasting). You may have this done every 1-3 years.  Abdominal aortic aneurysm (AAA) screening. You may need this if you are a current or former smoker.  Osteoporosis. You may be screened starting at age 42 if you are at high risk. Talk with your health care provider about your test results, treatment options,  and if necessary, the need for more tests. Vaccines  Your health care provider may recommend certain vaccines, such as:  Influenza vaccine. This is recommended every year.  Tetanus, diphtheria, and acellular pertussis (Tdap, Td) vaccine. You may need a Td booster every 10  years.  Zoster vaccine. You may need this after age 14.  Pneumococcal 13-valent conjugate (PCV13) vaccine. One dose is recommended after age 1.  Pneumococcal polysaccharide (PPSV23) vaccine. One dose is recommended after age 46. Talk to your health care provider about which screenings and vaccines you need and how often you need them. This information is not intended to replace advice given to you by your health care provider. Make sure you discuss any questions you have with your health care provider. Document Released: 01/20/2015 Document Revised: 09/13/2015 Document Reviewed: 10/25/2014 Elsevier Interactive Patient Education  2017 [place] Prevention in the Home Falls can cause injuries. They can happen to people of all ages. There are many things you can do to make your home safe and to help prevent falls. What can I do on the outside of my home?  Regularly fix the edges of walkways and driveways and fix any cracks.  Remove anything that might make you trip as you walk through a door, such as a raised step or threshold.  Trim any bushes or trees on the path to your home.  Use bright outdoor lighting.  Clear any walking paths of anything that might make someone trip, such as rocks or tools.  Regularly check to see if handrails are loose or broken. Make sure that both sides of any steps have handrails.  Any raised decks and porches should have guardrails on the edges.  Have any leaves, snow, or ice cleared regularly.  Use sand or salt on walking paths during winter.  Clean up any spills in your garage right away. This includes oil or grease spills. What can I do in the bathroom?  Use night lights.  Install grab bars by the toilet and in the tub and shower. Do not use towel bars as grab bars.  Use non-skid mats or decals in the tub or shower.  If you need to sit down in the shower, use a plastic, non-slip stool.  Keep the floor dry. Clean up any water that  spills on the floor as soon as it happens.  Remove soap buildup in the tub or shower regularly.  Attach bath mats securely with double-sided non-slip rug tape.  Do not have throw rugs and other things on the floor that can make you trip. What can I do in the bedroom?  Use night lights.  Make sure that you have a light by your bed that is easy to reach.  Do not use any sheets or blankets that are too big for your bed. They should not hang down onto the floor.  Have a firm chair that has side arms. You can use this for support while you get dressed.  Do not have throw rugs and other things on the floor that can make you trip. What can I do in the kitchen?  Clean up any spills right away.  Avoid walking on wet floors.  Keep items that you use a lot in easy-to-reach places.  If you need to reach something above you, use a strong step stool that has a grab bar.  Keep electrical cords out of the way.  Do not use floor polish or wax that makes floors slippery. If  you must use wax, use non-skid floor wax.  Do not have throw rugs and other things on the floor that can make you trip. What can I do with my stairs?  Do not leave any items on the stairs.  Make sure that there are handrails on both sides of the stairs and use them. Fix handrails that are broken or loose. Make sure that handrails are as long as the stairways.  Check any carpeting to make sure that it is firmly attached to the stairs. Fix any carpet that is loose or worn.  Avoid having throw rugs at the top or bottom of the stairs. If you do have throw rugs, attach them to the floor with carpet tape.  Make sure that you have a light switch at the top of the stairs and the bottom of the stairs. If you do not have them, ask someone to add them for you. What else can I do to help prevent falls?  Wear shoes that:  Do not have high heels.  Have rubber bottoms.  Are comfortable and fit you well.  Are closed at the  toe. Do not wear sandals.  If you use a stepladder:  Make sure that it is fully opened. Do not climb a closed stepladder.  Make sure that both sides of the stepladder are locked into place.  Ask someone to hold it for you, if possible.  Clearly mark and make sure that you can see:  Any grab bars or handrails.  First and last steps.  Where the edge of each step is.  Use tools that help you move around (mobility aids) if they are needed. These include:  Canes.  Walkers.  Scooters.  Crutches.  Turn on the lights when you go into a dark area. Replace any light bulbs as soon as they burn out.  Set up your furniture so you have a clear path. Avoid moving your furniture around.  If any of your floors are uneven, fix them.  If there are any pets around you, be aware of where they are.  Review your medicines with your doctor. Some medicines can make you feel dizzy. This can increase your chance of falling. Ask your doctor what other things that you can do to help prevent falls. This information is not intended to replace advice given to you by your health care provider. Make sure you discuss any questions you have with your health care provider. Document Released: 10/20/2008 Document Revised: 06/01/2015 Document Reviewed: 01/28/2014 Elsevier Interactive Patient Education  2017 [employer].

## 2016-11-26 NOTE — Progress Notes (Signed)
Subjective:   Randy [name] is a 82 y.o. male who presents for Medicare Annual/Subsequent preventive examination.  Review of Systems:   Cardiac Risk Factors include: hypertension;male gender;advanced age (>39men, >50 women);dyslipidemia;obesity (BMI >30kg/m2)     Objective:    Vitals: BP 138/78 (BP Location: Left Arm, Patient Position: Sitting)   Pulse 64   Temp 97.6 F (36.4 C) (Oral)   Ht 6' (1.829 m)   Wt 228 lb 9.6 oz (103.7 kg)   BMI 31.00 kg/m   Body mass index is 31 kg/m.  Tobacco Social History   Tobacco Use  Smoking Status Former Smoker  . Packs/day: 1.00  . Years: 12.00  . Pack years: 12.00  . Types: Cigarettes  . Last attempt to quit: 1968  . Years since quitting: 50.9  Smokeless Tobacco Former Engineer, structural given: No   Past Medical History:  Diagnosis Date  . GERD (gastroesophageal reflux disease)   . Hyperlipidemia   . Hypertension   . Osteoporosis    Past Surgical History:  Procedure Laterality Date  . GANGLION CYST EXCISION Left 2013   Left upper arm  . HERNIA REPAIR N/A 2008   MIdline, ventral acquired hernia repair   Family History  Problem Relation Age of Onset  . Breast cancer Mother   . Lung cancer Father   . Heart disease Brother   . Leukemia Brother   . Prostate cancer Neg Hx   . Colon cancer Neg Hx   . Bladder Cancer Neg Hx   . Kidney cancer Neg Hx    Social History   Substance and Sexual Activity  Sexual Activity Not on file    Outpatient Encounter Medications as of 11/26/2016  Medication Sig  . cholecalciferol (VITAMIN D) 1000 units tablet Take 1,000 Units by mouth daily.  . Coenzyme Q10 (CO Q 10) 100 MG CAPS Take by mouth.  . enalapril (VASOTEC) 20 MG tablet Take 1 tablet (20 mg total) by mouth daily.  . Omega-3 Fatty Acids (FISH OIL) 1200 MG CAPS Take by mouth.  [name] omeprazole (PRILOSEC) 20 MG capsule TAKE 1 CAPSULE BY MOUTH EVERY DAY  . furosemide (LASIX) 20 MG tablet Take 1 tablet (20 mg total) by mouth daily  as needed for edema. For 1 week take 2 pills in morning then resume normal dose. (Patient not taking: Reported on 09/11/2016)  . [DISCONTINUED] aspirin EC 81 MG tablet Take 1 tablet (81 mg total) by mouth daily. (Patient not taking: Reported on 11/26/2016)  . [DISCONTINUED] calcium citrate-vitamin D (CITRACAL+D) 315-200 MG-UNIT tablet Take 1 tablet by mouth daily.  . [DISCONTINUED] potassium chloride (K-DUR) 10 MEQ tablet Take 1 tablet (10 mEq total) by mouth daily. (Patient not taking: Reported on 11/26/2016)   No facility-administered encounter medications on file as of 11/26/2016.     Activities of Daily Living In your present state of health, do you have any difficulty performing the following activities: 11/26/2016 03/05/2016  Hearing? Y N  Comment has hearing aids, doesnt like to wear them -  Vision? N N  Comment reading glasses -  Difficulty concentrating or making decisions? N N  Walking or climbing stairs? Y Y  Dressing or bathing? N N  Doing errands, shopping? N N  Preparing Food and eating ? N -  Using the Toilet? N -  In the past six months, have you accidently leaked urine? Y -  Comment wears pads -  Do you have problems with loss of bowel control?  N -  Managing your Medications? N -  Managing your Finances? N -  Housekeeping or managing your Housekeeping? N -  Some recent data might be hidden    Patient Care Team: [name], DO as PCP - General (Family Medicine)   Assessment:     Exercise Activities and Dietary recommendations Current Exercise Habits: The patient does not participate in regular exercise at present(leg lifts occasionally ), Exercise limited by: None identified  Goals    . DIET - INCREASE WATER INTAKE     Recommend drinking at least 4-5 glasses of water a day       Fall Risk Fall Risk  11/26/2016 03/05/2016  Falls in the past year? No No   Depression Screen PHQ 2/9 Scores 11/26/2016 03/05/2016  PHQ - 2 Score 0 0    Cognitive  Function     6CIT Screen 11/26/2016  What Year? 0 points  What month? 0 points  What time? 0 points  Count back from 20 0 points  Months in reverse 2 points  Repeat phrase 6 points  Total Score 8    Immunization History  Administered Date(s) Administered  . Influenza, High Dose Seasonal PF 10/01/2016  . Pneumococcal Conjugate-13 09/11/2016  . Tdap 03/05/2016   Screening Tests Health Maintenance  Topic Date Due  . PNA vac Low Risk Adult (2 of 2 - PPSV23) 09/11/2017  . TETANUS/TDAP  03/05/2026  . INFLUENZA VACCINE  Completed      Plan:    I have personally reviewed and addressed the Medicare Annual Wellness questionnaire and have noted the following in the patient's chart:  A. Medical and social history B. Use of alcohol, tobacco or illicit drugs  C. Current medications and supplements D. Functional ability and status E.  Nutritional status F.  Physical activity G. Advance directives H. List of other physicians I.  Hospitalizations, surgeries, and ER visits in previous 12 months J.  [place] such as hearing and vision if needed, cognitive and depression L. Referrals and appointments   In addition, I have reviewed and discussed with patient certain preventive protocols, quality metrics, and best practice recommendations. A written personalized care plan for preventive services as well as general preventive health recommendations were provided to patient.   Signed,  Tyler Aas, LPN Nurse Health Advisor   Nurse Notes: Patient states he had body aches after pneumonia vaccine in September that lasted approx 2-3 months. Tylenol has helped the aches.

## 2016-12-06 ENCOUNTER — Encounter (INDEPENDENT_AMBULATORY_CARE_PROVIDER_SITE_OTHER): Payer: Self-pay | Admitting: Vascular Surgery

## 2016-12-06 ENCOUNTER — Ambulatory Visit (INDEPENDENT_AMBULATORY_CARE_PROVIDER_SITE_OTHER): Payer: Medicare Other | Admitting: Vascular Surgery

## 2016-12-06 VITALS — BP 157/88 | HR 64 | Resp 15 | Ht 72.0 in | Wt 225.0 lb

## 2016-12-06 DIAGNOSIS — I10 Essential (primary) hypertension: Secondary | ICD-10-CM

## 2016-12-06 DIAGNOSIS — I872 Venous insufficiency (chronic) (peripheral): Secondary | ICD-10-CM | POA: Diagnosis not present

## 2016-12-06 DIAGNOSIS — E782 Mixed hyperlipidemia: Secondary | ICD-10-CM

## 2016-12-06 DIAGNOSIS — R600 Localized edema: Secondary | ICD-10-CM | POA: Diagnosis not present

## 2016-12-06 NOTE — Progress Notes (Signed)
MRN : [id number]  Randy [name] is a 82 y.o. (1934-12-28) male who presents with chief complaint of  Chief Complaint  Patient presents with  . Follow-up    3 months no studies  .  History of Present Illness: Patient returns today in follow up of leg swelling and venous insufficiency.  Since his last visit, he has been wearing compression stockings regularly and has noticed a marked improvement in terms of the pain and swelling in his legs.  He really is only having mild swelling at this point.  The discomfort is much better.  Overall, he is pleased and has not had any worsening symptoms over the past several months.  Past Medical History:  Diagnosis Date  . GERD (gastroesophageal reflux disease)   . Hyperlipidemia   . Hypertension   . Osteoporosis          Past Surgical History:  Procedure Laterality Date  . GANGLION CYST EXCISION Left 2013   Left upper arm  . HERNIA REPAIR N/A 2008   MIdline, ventral acquired hernia repair         Family History  Problem Relation Age of Onset  . Breast cancer Mother   . Lung cancer Father   . Heart disease Brother   . Leukemia Brother   . Prostate cancer Neg Hx   . Colon cancer Neg Hx   . Bladder Cancer Neg Hx   . Kidney cancer Neg Hx      Social History      Social History  Substance Use Topics  . Smoking status: Former Smoker    Packs/day: 1.00    Years: 12.00    Types: Cigarettes    Quit date: 1968  . Smokeless tobacco: Former Systems developer  . Alcohol use No          Allergies  Allergen Reactions  . Antihistamines, Chlorpheniramine-Type Other (See Comments)    Can't urinate          Current Outpatient Prescriptions  Medication Sig Dispense Refill  . calcium citrate-vitamin D (CITRACAL+D) 315-200 MG-UNIT tablet Take 1 tablet by mouth daily.    . Coenzyme Q10 (CO Q 10) 100 MG CAPS Take by mouth.    . doxycycline (VIBRA-TABS) 100 MG tablet Take 1 tablet (100 mg total) by mouth 2  (two) times daily. For 10 days. Take with full glass of water, stay upright 30 min after taking. 20 tablet 0  . enalapril (VASOTEC) 10 MG tablet Take 1 tablet (10 mg total) by mouth daily. 90 tablet 3  . furosemide (LASIX) 20 MG tablet Take 1 tablet (20 mg total) by mouth daily as needed for edema. For 1 week take 2 pills in morning then resume normal dose. 45 tablet 2  . Omega-3 Fatty Acids (FISH OIL) 1200 MG CAPS Take by mouth.    [name] omeprazole (PRILOSEC) 20 MG capsule Take 1 capsule (20 mg total) by mouth daily. 90 capsule 3  . potassium chloride (K-DUR) 10 MEQ tablet Take 1 tablet (10 mEq total) by mouth daily. 3 tablet 0   No current facility-administered medications for this visit.       REVIEW OF SYSTEMS (Negative unless checked)  Constitutional: [] Weight loss  [] Fever  [] Chills Cardiac: [] Chest pain   [] Chest pressure   [] Palpitations   [] Shortness of breath when laying flat   [] Shortness of breath at rest   [] Shortness of breath with exertion. Vascular:  [x] Pain in legs with walking   [] Pain in legs at  rest   [] Pain in legs when laying flat   [] Claudication   [] Pain in feet when walking  [] Pain in feet at rest  [] Pain in feet when laying flat   [] History of DVT   [] Phlebitis   [x] Swelling in legs   [x] Varicose veins   [] Non-healing ulcers Pulmonary:   [] Uses home oxygen   [] Productive cough   [] Hemoptysis   [] Wheeze  [] COPD   [] Asthma Neurologic:  [] Dizziness  [] Blackouts   [] Seizures   [] History of stroke   [] History of TIA  [] Aphasia   [] Temporary blindness   [] Dysphagia   [] Weakness or numbness in arms   [] Weakness or numbness in legs Musculoskeletal:  [x] Arthritis   [] Joint swelling   [] Joint pain   [] Low back pain Hematologic:  [] Easy bruising  [] Easy bleeding   [] Hypercoagulable state   [] Anemic  [] Hepatitis Gastrointestinal:  [] Blood in stool   [] Vomiting blood  [] Gastroesophageal reflux/heartburn   [] Abdominal pain Genitourinary:  [] Chronic kidney disease   [] Difficult  urination  [] Frequent urination  [] Burning with urination   [] Hematuria Skin:  [] Rashes   [] Ulcers   [] Wounds Psychological:  [] History of anxiety   []  History of major depression.      Physical Examination  BP (!) 157/88 (BP Location: Right Arm, Patient Position: Sitting)   Pulse 64   Resp 15   Ht 6' (1.829 m)   Wt 102.1 kg (225 lb)   BMI 30.52 kg/m  Gen:  WD/WN, NAD.  younger than stated age Head: [place]/AT, No temporalis wasting. Ear/Nose/Throat: Hearing grossly intact, nares w/o erythema or drainage, trachea midline Eyes: Conjunctiva clear. Sclera non-icteric Neck: Supple.  No JVD.  Pulmonary:  Good air movement, no use of accessory muscles.  Cardiac: RRR, no JVD Vascular:  Vessel Right Left  Radial Palpable Palpable                                    Musculoskeletal: M/S 5/5 throughout.  No deformity or atrophy.  Trace bilateral lower extremity edema. Neurologic: Sensation grossly intact in extremities.  Symmetrical.  Speech is fluent.  Psychiatric: Judgment intact, Mood & affect appropriate for pt's clinical situation. Dermatologic: No rashes or ulcers noted.  No cellulitis or open wounds.       Labs No results found for this or any previous visit (from the past 2160 hour(s)).  Radiology No results found.    Assessment/Plan Hyperlipidemia lipid control important in reducing the progression of atherosclerotic disease. Intolerant to statins   Hypertension blood pressure control important in reducing the progression of atherosclerotic disease. On appropriate oral medications.   Chronic venous insufficiency He had noninvasive studies including an ultrasound originally that was negative for DVT. He has had more detailed noninvasive studies in our office which have shown normal arterial perfusion with normal ABIs and waveforms consistent with no lower extremity arterial insufficiency. A venous reflux study was performed previously demonstrating  bilateral deep venous reflux, great saphenous vein reflux, and right small saphenous vein reflux. No DVT or superficial thrombophlebitis was identified.  He has done quite well with conservative therapy.  At this point, I do not think intervention is likely going to be of great benefit.  I will plan to see him back in 1 year or sooner if symptoms worsen in the interim.     Leotis Pain, MD  12/06/2016 2:01 PM    This note was created with Dragon medical transcription  system.  Any errors from dictation are purely unintentional

## 2016-12-10 ENCOUNTER — Ambulatory Visit: Payer: Medicare Other

## 2016-12-11 ENCOUNTER — Encounter: Payer: Self-pay | Admitting: Family Medicine

## 2016-12-11 ENCOUNTER — Ambulatory Visit (INDEPENDENT_AMBULATORY_CARE_PROVIDER_SITE_OTHER): Payer: Medicare Other | Admitting: Family Medicine

## 2016-12-11 ENCOUNTER — Other Ambulatory Visit: Payer: Self-pay | Admitting: Family Medicine

## 2016-12-11 ENCOUNTER — Other Ambulatory Visit: Payer: Self-pay

## 2016-12-11 VITALS — BP 136/84 | HR 66 | Temp 97.5°F | Resp 16 | Ht 72.0 in | Wt 224.2 lb

## 2016-12-11 DIAGNOSIS — N400 Enlarged prostate without lower urinary tract symptoms: Secondary | ICD-10-CM

## 2016-12-11 DIAGNOSIS — Z0000 Encounter for general adult medical examination without abnormal findings: Secondary | ICD-10-CM

## 2016-12-11 DIAGNOSIS — M150 Primary generalized (osteo)arthritis: Secondary | ICD-10-CM | POA: Diagnosis not present

## 2016-12-11 DIAGNOSIS — I10 Essential (primary) hypertension: Secondary | ICD-10-CM

## 2016-12-11 DIAGNOSIS — R7309 Other abnormal glucose: Secondary | ICD-10-CM

## 2016-12-11 DIAGNOSIS — Z125 Encounter for screening for malignant neoplasm of prostate: Secondary | ICD-10-CM

## 2016-12-11 DIAGNOSIS — I872 Venous insufficiency (chronic) (peripheral): Secondary | ICD-10-CM

## 2016-12-11 DIAGNOSIS — E782 Mixed hyperlipidemia: Secondary | ICD-10-CM

## 2016-12-11 DIAGNOSIS — D649 Anemia, unspecified: Secondary | ICD-10-CM

## 2016-12-11 DIAGNOSIS — M8949 Other hypertrophic osteoarthropathy, multiple sites: Secondary | ICD-10-CM

## 2016-12-11 DIAGNOSIS — M159 Polyosteoarthritis, unspecified: Secondary | ICD-10-CM

## 2016-12-11 NOTE — Assessment & Plan Note (Signed)
Stable, improved control on compression and conservative therapy Followed by [facility], Dr Lucky Cowboy [place] confirmed insufficiency and reflux Recommend to continue current treatment - Continue INFREQUENT lasix, may DC in future if not using, consider add thiazide if need - Follow-up

## 2016-12-11 NOTE — Assessment & Plan Note (Signed)
Stable chronic OA/DJD multiple joints with limited ROM and pain Reduced function due to OA, but remains mobile and ambulatory, uses cane Continue conservative therapy Completed form for DMV Handicap placard, permanent, returned to patient today

## 2016-12-11 NOTE — Assessment & Plan Note (Addendum)
Mildly elevated initial BP, repeat manual check improved. - Home BP readings reportedly normal to elevated [phone]   No known complications  Infrequent use of Lasix (rarely now)   Plan:  1. Continue current BP regimen - Enalapril 20mg  daily 2. Encourage improved lifestyle - low sodium diet, improve regular exercise 3. Continue monitor BP outside office, bring readings to next visit, if persistently >140/90 or new symptoms notify office sooner 4. Follow-up 6 months annual + labs, if BP elevated return sooner. Discussed next step is to add other med such as thiazide

## 2016-12-11 NOTE — Patient Instructions (Addendum)
Thank you for coming to the clinic today.  1. Keep up the good work overall!  2. Continue to check BP regularly and monitor it, if it is elevated >138 to 140 then we may need to add a 2nd BP med  3. If you dont' need fluid pill anymore (Furosemide Lasix) you can tell pharmacy to stop filling it  DUE for FASTING BLOOD WORK (no food or drink after midnight before the lab appointment, only water or coffee without cream/sugar on the morning of)  SCHEDULE "Lab Only" visit in the morning at the clinic for lab draw in 6 MONTHS  - Make sure Lab Only appointment is at about 1 week before your next appointment, so that results will be available  Please schedule a Follow-up Appointment to: Return in about 6 months (around 06/11/2017) for Annual Physical.  If you have any other questions or concerns, please feel free to call the clinic or send a message through [place]. You may also schedule an earlier appointment if necessary.  Additionally, you may be receiving a survey about your experience at our clinic within a few days to 1 week by e-mail or mail. We value your feedback.  [name], DO [place]

## 2016-12-11 NOTE — Progress Notes (Signed)
Subjective:    Patient ID: Randy [name], male    DOB: 06/24/1934, 82 y.o.   MRN: [id number]  Randy [name] is a 82 y.o. male presenting on 12/11/2016 for Hypertension   HPI   CHRONIC HTN - Last visit with me for HTN was 09/11/16, treated with inc Enalapril from 10 to 20mg , see prior notes for background information. - Interval update with home BP readings remained stable without much change, no record today but believes it is 120 to 140 on avg at home, last visit 11/30 with vascular had SBP 157, still not using Furosemide except maybe 1x since last visit, - Today patient reports no new concerns, feels good Current Meds - Enalapril 10mg  - Never on thiazide Reports good compliance, took BP med today.Tolerating well, w/o complaints. Lifestyle: - Diet: No changes - Exercise: increased walking now with less swelling, trying to improve regular exercise - He has started OTC ASA 81mg  daily but since forgot about it and now not taking regularly was tolerating it well Denies CP, dyspnea, HA, dizziness / lightheadedness  FOLLOW-UP VENOUS INSUFFICIENCY / LE EDEMA - Last visit with me 09/11/16, for same problem, treated with continued plan, already followed by Dr [facility], compression infrequent lasix and inc ACEi, see prior notes for background information. - Interval update with still improved LE edema on compression, last saw Dr Lucky Cowboy at [facility] 11/30, had prior imaging, reassuring, advised to continue plan and follow-up 1 year, no other intervention - Today patient reports doing well - He rarely uses Lasix, maybe 1 dose since last visit, he still has plenty of med, may stop from pharmacy - Denies redness, pain in legs or calves, weeping or oozing, fever chills  Osteoarthritis Multiple Joints / Lumbar Spine / Knees / Hips - Reports chronic problem with OA/DJD arthritis, he has been treated before in past, had prior X-rays last on chart 11/2014 with lumbar spine x-ray moderate DJD.  This has limited his mobility over the years. He walks with cane most of time, has difficulty with walking long distances, limited with required rest if walk >200 ft. Requesting handicap placard today. - Taking Tylenol PRN, no other regular meds at this time - Denies fall or injury or other joint pain  Health Maintenance: UTD  Depression screen [facility] 2/9 11/26/2016 03/05/2016  Decreased Interest 0 0  Down, Depressed, Hopeless 0 0  PHQ - 2 Score 0 0    Social History   Tobacco Use  . Smoking status: Former Smoker    Packs/day: 1.00    Years: 12.00    Pack years: 12.00    Types: Cigarettes    Last attempt to quit: 1968    Years since quitting: 50.9  . Smokeless tobacco: Former Network engineer Use Topics  . Alcohol use: No  . Drug use: No    Review of Systems Per HPI unless specifically indicated above     Objective:    BP 136/84 (BP Location: Right Arm, Cuff Size: Normal)   Pulse 66   Temp (!) 97.5 F (36.4 C) (Oral)   Resp 16   Ht 6' (1.829 m)   Wt 224 lb 3.2 oz (101.7 kg)   BMI 30.41 kg/m   Wt Readings from Last 3 Encounters:  12/11/16 224 lb 3.2 oz (101.7 kg)  12/06/16 225 lb (102.1 kg)  11/26/16 228 lb 9.6 oz (103.7 kg)    Physical Exam  Constitutional: He is oriented to person, place, and time. He  appears well-developed and well-nourished. No distress.  Well-appearing 82 year old male, comfortable, cooperative, has cane  HENT:  Head: Normocephalic and atraumatic.  Mouth/Throat: Oropharynx is clear and moist.  Eyes: Conjunctivae are normal. Right eye exhibits no discharge. Left eye exhibits no discharge.  Neck: Normal range of motion. Neck supple.  Cardiovascular: Normal rate, regular rhythm, normal heart sounds and intact distal pulses.  No murmur heard. Pulmonary/Chest: Effort normal and breath sounds normal. No respiratory distress. He has no wheezes. He has no rales.  Musculoskeletal: Normal range of motion. He exhibits edema (Stable to improved now +1  pitting edema R>L lower ext, has compression stockings).  Able to stand from seated position, some difficulty with transition. Gait is cautious and slow.  Neurological: He is alert and oriented to person, place, and time.  Skin: Skin is warm and dry. No rash noted. He is not diaphoretic. No erythema.  Wearing compression stockings bilateral  Psychiatric: He has a normal mood and affect. His behavior is normal.  Well groomed, good eye contact, normal speech and thoughts  Nursing note and vitals reviewed.  Results for orders placed or performed in visit on 08/15/16  Microscopic Examination  Result Value Ref Range   WBC, UA 0-5 0 - 5 /hpf   RBC, UA 0-2 0 - 2 /hpf   Epithelial Cells (non renal) None seen 0 - 10 /hpf   Mucus, UA Present (A) Not Estab.   Bacteria, UA None seen None seen/Few  Urinalysis, Complete  Result Value Ref Range   Specific Gravity, UA 1.015 1.005 - 1.030   pH, UA 7.0 5.0 - 7.5   Color, UA Yellow Yellow   Appearance Ur Clear Clear   Leukocytes, UA Negative Negative   Protein, UA Negative Negative/Trace   Glucose, UA Negative Negative   Ketones, UA Negative Negative   RBC, UA Negative Negative   Bilirubin, UA Negative Negative   Urobilinogen, Ur 0.2 0.2 - 1.0 mg/dL   Nitrite, UA Negative Negative   Microscopic Examination See below:       Assessment & Plan:   Problem List Items Addressed This Visit    Chronic venous insufficiency    Stable, improved control on compression and conservative therapy Followed by [facility], Dr Lucky Cowboy [place] confirmed insufficiency and reflux Recommend to continue current treatment - Continue INFREQUENT lasix, may DC in future if not using, consider add thiazide if need - Follow-up      Hypertension - Primary    Mildly elevated initial BP, repeat manual check improved. - Home BP readings reportedly normal to elevated 284-132   No known complications  Infrequent use of Lasix (rarely now)   Plan:  1. Continue  current BP regimen - Enalapril 20mg  daily 2. Encourage improved lifestyle - low sodium diet, improve regular exercise 3. Continue monitor BP outside office, bring readings to next visit, if persistently >140/90 or new symptoms notify office sooner 4. Follow-up 6 months annual + labs, if BP elevated return sooner. Discussed next step is to add other med such as thiazide      Osteoarthritis of multiple joints    Stable chronic OA/DJD multiple joints with limited ROM and pain Reduced function due to OA, but remains mobile and ambulatory, uses cane Continue conservative therapy Completed form for DMV Handicap placard, permanent, returned to patient today         No orders of the defined types were placed in this encounter.  Follow up plan: Return in about  6 months (around 06/11/2017) for Annual Physical.  Future labs ordered for 06/2017  [name], [facility] 12/11/2016, 4:39 PM

## 2017-01-06 ENCOUNTER — Telehealth: Payer: Self-pay

## 2017-01-06 ENCOUNTER — Other Ambulatory Visit: Payer: Self-pay | Admitting: Family Medicine

## 2017-01-06 DIAGNOSIS — K219 Gastro-esophageal reflux disease without esophagitis: Secondary | ICD-10-CM

## 2017-01-06 MED ORDER — RANITIDINE HCL 150 MG PO TABS: 150.0000 mg | ORAL_TABLET | Freq: Two times a day (BID) | ORAL | 3 refills | Status: DC

## 2017-01-06 NOTE — Telephone Encounter (Signed)
We can switch class of medicine. Stop Omeprazole. Start Zantac (Ranitidine) 150mg  take one twice daily before meal. Sent rx into pharmacy. This is also available OTC if he chooses.  [name], DO [facility] 01/06/2017, 10:27 AM

## 2017-01-06 NOTE — Telephone Encounter (Signed)
As per patient has side effect of worsening muscle cramps and weakening after taking omeprazole, he stopped using it from past 3 days and wants alternative please suggest ?

## 2017-01-06 NOTE — Telephone Encounter (Signed)
Patient advised.

## 2017-03-05 ENCOUNTER — Ambulatory Visit (INDEPENDENT_AMBULATORY_CARE_PROVIDER_SITE_OTHER): Payer: Medicare Other | Admitting: Family Medicine

## 2017-03-05 ENCOUNTER — Encounter: Payer: Self-pay | Admitting: Family Medicine

## 2017-03-05 VITALS — BP 165/77 | HR 57 | Temp 97.5°F | Resp 16 | Ht 72.0 in | Wt 223.0 lb

## 2017-03-05 DIAGNOSIS — H60543 Acute eczematoid otitis externa, bilateral: Secondary | ICD-10-CM | POA: Diagnosis not present

## 2017-03-05 MED ORDER — HYDROCORTISONE-ACETIC ACID 1-2 % OT SOLN: 3.0000 [drp] | Freq: Three times a day (TID) | OTIC | 0 refills | Status: DC

## 2017-03-05 NOTE — Progress Notes (Signed)
Subjective:    Patient ID: Randy [name], male    DOB: 11-Mar-1934, 83 y.o.   MRN: [id number]  Randy [name] is a 83 y.o. male presenting on 03/05/2017 for Ear Pain (worst in Right ear but has pain in both ear)   HPI  Ear Pain and Itching, R>L Reports symptoms started about 1 week ago with R ear pain and itching feels some "dry skin and wax" coming out of ears, if he will feel some "popping" within ears, he uses Q-tips, able to get some wax out at times. He has used new shampoo recently. - Never seen ENT. In past used hearing aids, none recently. - Denies fevers, chills, sinus pain or pressure, congestion or drainage, cough,dizziness lightheaded, headache, vertigo  Depression screen [facility] 2/9 11/26/2016 03/05/2016  Decreased Interest 0 0  Down, Depressed, Hopeless 0 0  PHQ - 2 Score 0 0    Social History   Tobacco Use  . Smoking status: Former Smoker    Packs/day: 1.00    Years: 12.00    Pack years: 12.00    Types: Cigarettes    Last attempt to quit: 1968    Years since quitting: 51.1  . Smokeless tobacco: Former Network engineer Use Topics  . Alcohol use: No  . Drug use: No    Review of Systems Per HPI unless specifically indicated above     Objective:    BP (!) 165/77   Pulse (!) 57   Temp (!) 97.5 F (36.4 C) (Oral)   Resp 16   Ht 6' (1.829 m)   Wt 223 lb (101.2 kg)   BMI 30.24 kg/m   Wt Readings from Last 3 Encounters:  03/05/17 223 lb (101.2 kg)  12/11/16 224 lb 3.2 oz (101.7 kg)  12/06/16 225 lb (102.1 kg)    Physical Exam  Constitutional: He is oriented to person, place, and time. He appears well-developed and well-nourished. No distress.  Well-appearing, comfortable, cooperative  HENT:  Head: Normocephalic and atraumatic.  Mouth/Throat: Oropharynx is clear and moist.  Frontal / maxillary sinuses non-tender. Nares patent without purulence or edema. Bilateral TMs clear without erythema, effusion or bulging.  Bilateral external ears with significant dry  flaking skin with patchy fibrotic areas within external ear canal. No erythema, non tender.  Oropharynx clear without erythema, exudates, edema or asymmetry.  Eyes: Conjunctivae are normal. Right eye exhibits no discharge. Left eye exhibits no discharge.  Cardiovascular: Normal rate.  Pulmonary/Chest: Effort normal.  Musculoskeletal: He exhibits no edema.  Neurological: He is alert and oriented to person, place, and time.  Skin: Skin is warm and dry. No rash noted. He is not diaphoretic. No erythema.  Psychiatric: He has a normal mood and affect. His behavior is normal.  Well groomed, good eye contact, normal speech and thoughts  Nursing note and vitals reviewed.     R Ear     Results for orders placed or performed in visit on 08/15/16  Microscopic Examination  Result Value Ref Range   WBC, UA 0-5 0 - 5 /hpf   RBC, UA 0-2 0 - 2 /hpf   Epithelial Cells (non renal) None seen 0 - 10 /hpf   Mucus, UA Present (A) Not Estab.   Bacteria, UA None seen None seen/Few  Urinalysis, Complete  Result Value Ref Range   Specific Gravity, UA 1.015 1.005 - 1.030   pH, UA 7.0 5.0 - 7.5   Color, UA Yellow Yellow   Appearance Ur Clear Clear  Leukocytes, UA Negative Negative   Protein, UA Negative Negative/Trace   Glucose, UA Negative Negative   Ketones, UA Negative Negative   RBC, UA Negative Negative   Bilirubin, UA Negative Negative   Urobilinogen, Ur 0.2 0.2 - 1.0 mg/dL   Nitrite, UA Negative Negative   Microscopic Examination See below:       Assessment & Plan:   Problem List Items Addressed This Visit    None    Visit Diagnoses    Acute eczematoid otitis externa of both ears    -  Primary   Relevant Medications   acetic acid-hydrocortisone (VOSOL-HC) OTIC solution      Clinically consistent with otitis externa with more eczema/atopic derm appearance based on skin, without sign of secondary infection. No AOM. No significant hearing loss or change (has some stable presbycusis).  Possible secondary to new shampoo or topical trigger.  Plan Trial on Acetic acid-hydrocortisone otc drops - instructions per rx Follow-up within 1 week as needed - may request referral to ENT if not improving   Meds ordered this encounter  Medications  . acetic acid-hydrocortisone (VOSOL-HC) OTIC solution    Sig: Place 3 drops into both ears 3 (three) times daily. May use wick for first 24 hours    Dispense:  10 mL    Refill:  0      Follow up plan: Return in about 1 week (around 03/12/2017), or if symptoms worsen or fail to improve, for external ear problem.  [name], [facility] 03/05/2017, 6:45 PM

## 2017-03-05 NOTE — Patient Instructions (Addendum)
Thank you for coming to the office today.  1.  Start with topical ear drops as prescribed use up to 3 drops 3 times a day - can start with using a kleenex rolled wick or other wick OTC - may apply drops to wick to spread out medicine, do this every 6-8 hours or 3 times a day  After first 24-48 hours may use drops only if working  Use for up to 1-2 weeks until resolved.  If not improved by 1 week or any worsening or hearing loss call office we can refer you to ENT   ENT  [facility] ENT [facility] [address]  [place], [place] [address] Ph: [phone]  [facility]NT [facility] [address]  [place], [place] [address] Ph: [phone]   Please schedule a Follow-up Appointment to: Return in about 1 week (around 03/12/2017), or if symptoms worsen or fail to improve, for external ear problem.    If you have any other questions or concerns, please feel free to call the office or send a message through [place]. You may also schedule an earlier appointment if necessary.  Additionally, you may be receiving a survey about your experience at our office within a few days to 1 week by e-mail or mail. We value your feedback.  [name], DO [place]

## 2017-03-12 ENCOUNTER — Encounter: Payer: Self-pay | Admitting: Family Medicine

## 2017-03-12 ENCOUNTER — Ambulatory Visit (INDEPENDENT_AMBULATORY_CARE_PROVIDER_SITE_OTHER): Payer: Medicare Other | Admitting: Family Medicine

## 2017-03-12 VITALS — BP 159/83 | HR 66 | Temp 97.5°F | Resp 16 | Ht 72.0 in | Wt 231.0 lb

## 2017-03-12 DIAGNOSIS — M545 Low back pain, unspecified: Secondary | ICD-10-CM

## 2017-03-12 DIAGNOSIS — H60543 Acute eczematoid otitis externa, bilateral: Secondary | ICD-10-CM

## 2017-03-12 DIAGNOSIS — G8929 Other chronic pain: Secondary | ICD-10-CM | POA: Diagnosis not present

## 2017-03-12 DIAGNOSIS — M47816 Spondylosis without myelopathy or radiculopathy, lumbar region: Secondary | ICD-10-CM | POA: Diagnosis not present

## 2017-03-12 MED ORDER — MELOXICAM 15 MG PO TABS: 15.0000 mg | ORAL_TABLET | Freq: Every day | ORAL | 2 refills | Status: DC

## 2017-03-12 NOTE — Progress Notes (Signed)
Subjective:    Patient ID: Randy [name], male    DOB: 1934/09/11, 83 y.o.   MRN: [id number]  Randy [name] is a 83 y.o. male presenting on 03/12/2017 for Ear Pain (worst on R side improved) and Back Pain   HPI  FOLLOW-UP Eczematoid Otitis Externa - Last visit with me 03/05/17 (1 week ago) for initial visit for this new problem, treated with Acetic acid hydrocortisone otic drops, see prior notes for background information. - Interval update with dramatic improvement on drops - Today patient reports itching and thick dry skin has resolved on ear drops, finished 1 week, no longer using wick, has few days left over - Denies any pain itching hearing loss ear drainage  Acute on Chronic Low Back Pain / Osteoarthritis Multiple Joints / Lumbar Spine - Last visit with me 12/11/16, for same problem, treated with continued conservative care, see prior notes for background information. - Interval update with patients back pain seems to be fairly constant, despite tylenol and conservative treatment - Today patient reports a gradual worsening constant aching pain across bilateral lumbar spine low back, describes severe pain now, especially with flare up over past 2-3 days, with possible recent injury 1 week ago with had to hold and lift a 71 year old grandchild, they jumped into his arms and he had to hold them and lift up and felt some pain in back, seemed to flare up his usual back pain, pain is not radiating down leg and not associated with sciatica - Last imaging 11/2014 Lumbar spine with moderate DJD - Taking Tylenol 500mg  x 2-3 tabs q 6-8 hours (without significant relief) - Has not tried NSAIDs recently, he has normal kidney function, never had PUD or ulcer, no known limitation from taking these and he just has not taken them - Not using heating pad or muscle rub - History of lumbar OA/DJD, never had back surgery - Admits recently mild difficulty sleeping due to pain - Denies any fevers/chills, numbness,  tingling, weakness, loss of control bladder/bowel incontinence or retention, unintentional wt loss, night sweats   Depression screen [facility] 2/9 11/26/2016 03/05/2016  Decreased Interest 0 0  Down, Depressed, Hopeless 0 0  PHQ - 2 Score 0 0    Social History   Tobacco Use  . Smoking status: Former Smoker    Packs/day: 1.00    Years: 12.00    Pack years: 12.00    Types: Cigarettes    Last attempt to quit: 1968    Years since quitting: 51.2  . Smokeless tobacco: Former Network engineer Use Topics  . Alcohol use: No  . Drug use: No    Review of Systems Per HPI unless specifically indicated above     Objective:    BP (!) 159/83   Pulse 66   Temp (!) 97.5 F (36.4 C) (Oral)   Resp 16   Ht 6' (1.829 m)   Wt 231 lb (104.8 kg)   BMI 31.33 kg/m   Wt Readings from Last 3 Encounters:  03/12/17 231 lb (104.8 kg)  03/05/17 223 lb (101.2 kg)  12/11/16 224 lb 3.2 oz (101.7 kg)    Physical Exam  Constitutional: He is oriented to person, place, and time. He appears well-developed and well-nourished. No distress.  Well-appearing, mild discomfort with low back pain, cooperative  HENT:  Head: Normocephalic and atraumatic.  Mouth/Throat: Oropharynx is clear and moist.  Frontal / maxillary sinuses non-tender. Nares patent without purulence or edema. Bilateral TMs clear without  erythema, effusion or bulging.  Dramatic improvement of bilateral external ears now with mostly resolved or resolving dry flaking thicker skin. No erythema, non tender.  Oropharynx clear without erythema, exudates, edema or asymmetry.  Eyes: Conjunctivae are normal. Right eye exhibits no discharge. Left eye exhibits no discharge.  Cardiovascular: Normal rate.  Pulmonary/Chest: Effort normal.  Musculoskeletal:  Low Back Inspection: Normal appearance, Large body habitus, no spinal deformity, symmetrical. Palpation: No tenderness over spinous processes. Bilateral lumbar paraspinal muscles non-tender and without  hypertonicity/spasm. ROM: Limited active ROM forward flex / back extension Special Testing: Seated SLR negative for radicular pain bilaterally, has some mild reproduced pain and stiffness  In low back with Left SLR. Strength: Bilateral hip flex/ext 5/5, knee flex/ext 5/5, ankle dorsiflex/plantarflex 5/5 Neurovascular: intact distal sensation to light touch  Able to stand from seated position with use of cane for support  Neurological: He is alert and oriented to person, place, and time.  Skin: Skin is warm and dry. No rash noted. He is not diaphoretic. No erythema.  Psychiatric: His behavior is normal.  Well groomed, good eye contact, normal speech and thoughts  Nursing note and vitals reviewed.    Right Ear (comparison to last office visit note picture)      Results for orders placed or performed in visit on 08/15/16  Microscopic Examination  Result Value Ref Range   WBC, UA 0-5 0 - 5 /hpf   RBC, UA 0-2 0 - 2 /hpf   Epithelial Cells (non renal) None seen 0 - 10 /hpf   Mucus, UA Present (A) Not Estab.   Bacteria, UA None seen None seen/Few  Urinalysis, Complete  Result Value Ref Range   Specific Gravity, UA 1.015 1.005 - 1.030   pH, UA 7.0 5.0 - 7.5   Color, UA Yellow Yellow   Appearance Ur Clear Clear   Leukocytes, UA Negative Negative   Protein, UA Negative Negative/Trace   Glucose, UA Negative Negative   Ketones, UA Negative Negative   RBC, UA Negative Negative   Bilirubin, UA Negative Negative   Urobilinogen, Ur 0.2 0.2 - 1.0 mg/dL   Nitrite, UA Negative Negative   Microscopic Examination See below:       Assessment & Plan:   Problem List Items Addressed This Visit    Chronic low back pain    Acute on chronic bilateral LBP without associated sciatica. Suspect likely due to muscle spasm/strain with possible recent lifting injury involving grandchild jumping into arms In setting of known chronic LBP with DJD, without prior surgery. - No red flag symptoms. Negative  SLR for radiculopathy - Not responding to conservative therapy, but some of therapy seems inadequate  Plan: 1. Start anti-inflammatory trial with rx Meloxicam 15mg  daily wc for 1-2 weeks regularly then PRN in future - caution on side effects and risks, not long term med but eventually may use PRN 2. Continue Tylenol regular dosing up to 500-1000mg  TID most days and PRN if need 3. Encouraged use of heating pad 1-2x daily for now then PRN 4. Defer repeat X-rays today, since acute onset pain 5. Follow-up 4-6 weeks if not improved for re-evaluation. If not improved consider X-ray imaging, other meds muscle relaxant vs gabapentin, consider trial of PT for strengthening.      Relevant Medications   meloxicam (MOBIC) 15 MG tablet   Degenerative joint disease (DJD) of lumbar spine - Primary    Likely underlying etiology of chronic low back pain See A&P for back pain today  Relevant Medications   meloxicam (MOBIC) 15 MG tablet    Other Visit Diagnoses    Acute eczematoid otitis externa of both ears       Resolved on ear drops. Follow-up if recurrence or new concern, may repeat course in future if returns.      Meds ordered this encounter  Medications  . meloxicam (MOBIC) 15 MG tablet    Sig: Take 1 tablet (15 mg total) by mouth daily. Take 1-2 weeks at a time then daily PRN    Dispense:  30 tablet    Refill:  2     Follow up plan: Return in about 6 weeks (around 04/23/2017) for low back pain.  [name], [facility] 03/12/2017, 1:04 PM

## 2017-03-12 NOTE — Assessment & Plan Note (Signed)
Likely underlying etiology of chronic low back pain See A&P for back pain today

## 2017-03-12 NOTE — Patient Instructions (Addendum)
Thank you for coming to the office today.   For ears, seems mostly resolved - may finish last 3 days of drops or save in case worse again in future.   1. For your Back Pain - I think that this is due to back strain with history of arthritis Start with anti-inflammatory Meloxicam (Mobic) 15mg  daily with food for 1-2 weeks, use only during flare up, if helping then can use only as needed Continue use Tylenol Extra Str 500mg  tabs - may take 1-2 tablets every 6 hours as needed Recommend to start using heating pad on your lower back 1-2x daily for few weeks  In future if not improving would recommend adding an other medicine such as Gabapentin for chronic back pain and can help sleep at night or may consider a baclofen.  This pain may take weeks to months to fully resolve, but hopefully it will respond to the medicine initially. All back injuries (small or serious) are slow to heal since we use our back muscles every day. Be careful with turning, twisting, lifting, sitting / standing for prolonged periods, and avoid re-injury.  If your symptoms significantly worsen with more pain, or new symptoms with weakness in one or both legs, new or different shooting leg pains, numbness in legs or groin, loss of control or retention of urine or bowel movements, please call back for advice and you may need to go directly to the Emergency Department.  May need physical therapy as well  Please schedule a Follow-up Appointment to: Return in about 6 weeks (around 04/23/2017) for low back pain.    If you have any other questions or concerns, please feel free to call the office or send a message through [place]. You may also schedule an earlier appointment if necessary.  Additionally, you may be receiving a survey about your experience at our office within a few days to 1 week by e-mail or mail. We value your feedback.  [name], DO [place]

## 2017-03-12 NOTE — Assessment & Plan Note (Signed)
Acute on chronic bilateral LBP without associated sciatica. Suspect likely due to muscle spasm/strain with possible recent lifting injury involving grandchild jumping into arms In setting of known chronic LBP with DJD, without prior surgery. - No red flag symptoms. Negative SLR for radiculopathy - Not responding to conservative therapy, but some of therapy seems inadequate  Plan: 1. Start anti-inflammatory trial with rx Meloxicam 15mg  daily wc for 1-2 weeks regularly then PRN in future - caution on side effects and risks, not long term med but eventually may use PRN 2. Continue Tylenol regular dosing up to 500-1000mg  TID most days and PRN if need 3. Encouraged use of heating pad 1-2x daily for now then PRN 4. Defer repeat X-rays today, since acute onset pain 5. Follow-up 4-6 weeks if not improved for re-evaluation. If not improved consider X-ray imaging, other meds muscle relaxant vs gabapentin, consider trial of PT for strengthening.

## 2017-04-28 ENCOUNTER — Encounter: Payer: Self-pay | Admitting: Family Medicine

## 2017-04-28 ENCOUNTER — Ambulatory Visit (INDEPENDENT_AMBULATORY_CARE_PROVIDER_SITE_OTHER): Payer: Medicare Other | Admitting: Family Medicine

## 2017-04-28 VITALS — BP 139/87 | HR 87 | Temp 98.1°F | Resp 16 | Ht 72.0 in | Wt 227.0 lb

## 2017-04-28 DIAGNOSIS — R197 Diarrhea, unspecified: Secondary | ICD-10-CM | POA: Diagnosis not present

## 2017-04-28 DIAGNOSIS — R531 Weakness: Secondary | ICD-10-CM

## 2017-04-28 NOTE — Progress Notes (Signed)
Subjective:    Patient ID: Randy [name], male    DOB: September 08, 1934, 83 y.o.   MRN: [id number]  Randy [name] is a 83 y.o. male presenting on 04/28/2017 for Diarrhea (onset yesterday little nausea)  Patient presents for a same day appointment. Family present with him today provides additional history.  HPI   DIARRHEA - Reports symptoms started yesterday with intermittent diarrhea episodes throughout most of day. Not necessarily related to eating or other factors. He tried a piece of toast and some started gatorade and water mixture. He had some brief nausea but without vomiting. He still has a good appetite. - Tried OTC Imodium for max amount for 24 hours, did not help as much - No known sick contacts, no antibiotics recently never had C Diff - No prior abdominal surgery, except hernia repair - Admits some associated nausea and possible fever but never had a confirmed fever - Admits feel gas and bloating, fullness of abdomen - Denies fevers, chills, muscle aches, abdominal pain cramping, nausea vomiting, early satiety  Generalized Weakness Additional complaint, seems more chronic, not related to new acute diarrhea as above. Family reports this and patient agrees, he has a significant gradual loss of strength in his arms and back muscles, has difficulty getting out of any chair without arm support. Seems to be persistent issue gradually worsening. 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 - Usually he is mostly sedentary over past >3-6 months, mostly sitting and watching TV, no longer walking as much - Has not had physical therapy recently - He uses cane for ambulation - Denies any recent fall  Past Surgical History:  Procedure Laterality Date  . GANGLION CYST EXCISION Left 2013   Left upper arm  . HERNIA REPAIR N/A 2008   MIdline, ventral acquired hernia repair   Depression screen [facility] 2/9 11/26/2016 03/05/2016  Decreased Interest 0 0  Down, Depressed,  Hopeless 0 0  PHQ - 2 Score 0 0    Social History   Tobacco Use  . Smoking status: Former Smoker    Packs/day: 1.00    Years: 12.00    Pack years: 12.00    Types: Cigarettes    Last attempt to quit: 1968    Years since quitting: 51.3  . Smokeless tobacco: Former Network engineer Use Topics  . Alcohol use: No  . Drug use: No    Review of Systems Per HPI unless specifically indicated above     Objective:    BP 139/87   Pulse 87   Temp 98.1 F (36.7 C) (Oral)   Resp 16   Ht 6' (1.829 m)   Wt 227 lb (103 kg)   BMI 30.79 kg/m   Wt Readings from Last 3 Encounters:  04/28/17 227 lb (103 kg)  03/12/17 231 lb (104.8 kg)  03/05/17 223 lb (101.2 kg)    Physical Exam  Constitutional: He is oriented to person, place, and time. He appears well-developed and well-nourished. No distress.  Chronically ill but currently well appearing, comfortable, cooperative  HENT:  Mouth/Throat: Oropharynx is clear and moist.  Eyes: Conjunctivae are normal. Right eye exhibits no discharge. Left eye exhibits no discharge.  Cardiovascular: Normal rate.  Pulmonary/Chest: Effort normal.  Abdominal: Soft. Bowel sounds are normal. He exhibits distension (Mild generalized abdominal distention including upper abdomen, similar to previous but seems slightly worse). He exhibits no mass. There is no tenderness. There is no rebound and no guarding.  Musculoskeletal: He exhibits  edema (stable edema lower ext w/o worsening).  Difficulty standing from seated position to standing without assistance of cane and requires a few tries. Cautious slow gait. Not antalgic. No focal tenderness knees or back.  Neurological: He is alert and oriented to person, place, and time.  Skin: Skin is warm and dry. No rash noted. He is not diaphoretic. No erythema.  Psychiatric: He has a normal mood and affect. His behavior is normal.  Well groomed, good eye contact, normal speech and thoughts  Nursing note and vitals  reviewed.  Results for orders placed or performed in visit on 08/15/16  Microscopic Examination  Result Value Ref Range   WBC, UA 0-5 0 - 5 /hpf   RBC, UA 0-2 0 - 2 /hpf   Epithelial Cells (non renal) None seen 0 - 10 /hpf   Mucus, UA Present (A) Not Estab.   Bacteria, UA None seen None seen/Few  Urinalysis, Complete  Result Value Ref Range   Specific Gravity, UA 1.015 1.005 - 1.030   pH, UA 7.0 5.0 - 7.5   Color, UA Yellow Yellow   Appearance Ur Clear Clear   Leukocytes, UA Negative Negative   Protein, UA Negative Negative/Trace   Glucose, UA Negative Negative   Ketones, UA Negative Negative   RBC, UA Negative Negative   Bilirubin, UA Negative Negative   Urobilinogen, Ur 0.2 0.2 - 1.0 mg/dL   Nitrite, UA Negative Negative   Microscopic Examination See below:       Assessment & Plan:   Problem List Items Addressed This Visit    Generalized weakness    Suspected multifactorial etiology, seems most consistent with deconditioning and generalized muscle weakness, in sedentary 83 year old male with several chronic illnesses affecting his function. He has known OA/DJD in lumbar spine and knees, affect him but not related to new or acute pain in joints.  Plan Discussion on approach to general weakness - advised he needs to start with improve diet, inc regular meals, protein, hydration. Additionally next step is improve exercise and activity, avoid sedentary start more walking. He has declined a HH PT or Ambulatory PT referral today, he wishes to proceed with exercise on his own for now, will notify if not improving or re-check in 6 weeks at annual       Other Visit Diagnoses    Diarrhea, unspecified type    -  Primary    Suspected viral GI or foodborne illness for acute onset diarrhea, seems uncomplicated without significant nausea vomiting or fever or other signs of illness at this time. Not consistent with C Diff no other triggers Limited improvement on imodium PRN No other GI  red flags (w/o pain, dark stool or blood in stool, unintentional wt loss) has appetite, but does have some abdominal gas and bloating.  Plan Reassurance given today Proceed with improve diet to resume more regular foods, inc rehydration gatorade/water/apple juice May continue Imodium PRN Offered Zofran PRN nausea, he declines now, can call if needs Follow-up if not improving  No orders of the defined types were placed in this encounter.     Follow up plan: Return in about 1 week (around 05/05/2017), or if symptoms worsen or fail to improve, for diarrhea, otherwise keep apt in June physical.  [name], DO [facility] 04/28/2017, 1:06 PM

## 2017-04-28 NOTE — Patient Instructions (Addendum)
Thank you for coming to the office today.  For diarrhea  Continue working on hydration - gatorade / water mixture, apple juice and water 50/50 mixture is good to help rehydrate  Try to gradually inc solid foods as well to help stabilize things  If gas and bloating, can try Gas-X OTC and may try Pepto as well for the diarrhea  You can still take Imodium if you need it  Call office if you want to request Zofran as needed for nausea  If worsening symptoms fever chills abdominal pain persistent diarrhea and nausea and cannot keep food down by 1 week or more, call or return or may need to go to Hospital ED for IV Fluids  Goal to work on increased exercise for improving strength we can consider Physical Therapy at home or referral if you are interested, this is what I recommend   Please schedule a Follow-up Appointment to: Return in about 1 week (around 05/05/2017), or if symptoms worsen or fail to improve, for diarrhea, otherwise keep apt in June physical.  If you have any other questions or concerns, please feel free to call the office or send a message through [place]. You may also schedule an earlier appointment if necessary.  Additionally, you may be receiving a survey about your experience at our office within a few days to 1 week by e-mail or mail. We value your feedback.  [name], DO [place]

## 2017-04-28 NOTE — Assessment & Plan Note (Signed)
Suspected multifactorial etiology, seems most consistent with deconditioning and generalized muscle weakness, in sedentary 83 year old male with several chronic illnesses affecting his function. He has known OA/DJD in lumbar spine and knees, affect him but not related to new or acute pain in joints.  Plan Discussion on approach to general weakness - advised he needs to start with improve diet, inc regular meals, protein, hydration. Additionally next step is improve exercise and activity, avoid sedentary start more walking. He has declined a HH PT or Ambulatory PT referral today, he wishes to proceed with exercise on his own for now, will notify if not improving or re-check in 6 weeks at annual

## 2017-05-05 ENCOUNTER — Other Ambulatory Visit: Payer: Self-pay | Admitting: Family Medicine

## 2017-05-05 ENCOUNTER — Ambulatory Visit (INDEPENDENT_AMBULATORY_CARE_PROVIDER_SITE_OTHER): Payer: Medicare Other | Admitting: Family Medicine

## 2017-05-05 ENCOUNTER — Encounter: Payer: Self-pay | Admitting: Family Medicine

## 2017-05-05 VITALS — BP 154/74 | HR 54 | Temp 97.6°F | Resp 16 | Ht 72.0 in | Wt 227.8 lb

## 2017-05-05 DIAGNOSIS — R531 Weakness: Secondary | ICD-10-CM

## 2017-05-05 DIAGNOSIS — K219 Gastro-esophageal reflux disease without esophagitis: Secondary | ICD-10-CM

## 2017-05-05 DIAGNOSIS — I10 Essential (primary) hypertension: Secondary | ICD-10-CM

## 2017-05-05 DIAGNOSIS — R197 Diarrhea, unspecified: Secondary | ICD-10-CM

## 2017-05-05 NOTE — Assessment & Plan Note (Addendum)
Significantly improved Previous concerns due to sedentary lifestyle and muscle weakness Proceed with plan to inc regular activity Future follow-up as planned at annual phys if need - additionally if still having weakness consider referral to PT

## 2017-05-05 NOTE — Progress Notes (Signed)
Subjective:    Patient ID: Randy [name], male    DOB: 09/23/1934, 83 y.o.   MRN: [id number]  Randy [name] is a 83 y.o. male presenting on 05/05/2017 for Diarrhea (improved) and Hypertension   HPI   FOLLOW-UP DIARRHEA / Generalized Weakness - Last visit with me 04/28/17, for initial visit for same problems, treated with supportive care improve hydration and resume regular diet, may try OTC meds PRN for gas/upset stomach diarrhea, see prior notes for background information. - Interval update with significant improvement in both symptoms now. - Today patient reports diarrhea has resolved. No longer on any medications for this, and he has stopped Imodium. He states BMs back to regular. Diet is normal and no concerns. Also his weakness has improved he feels like he has his strength and energy back. He is starting to do more walking around and outside house, and has no thad any problems with this, he is not interested in PT now but will reconsider - See last note for background on weakness - this had been a gradual chronic problem previously - He uses cane to ambulate - Denies any falls or other injury, diarrhea, dark stools, blood in stool, nausea vomiting, fever chills, abdominal pain  CHRONIC HTN: Reports no new concerns, but based on chart review and reading today he has higher reading still initial check,  Current Meds - Enalapril 20mg  daily - No longer taking Furosemide regularly, only PRN now, and rarely takes   Reports good compliance, took meds today. Tolerating well, w/o complaints. Lifestyle: - Exercise: improved walking Denies CP, dyspnea, HA, edema, dizziness / lightheadedness   Depression screen [facility] 2/9 11/26/2016 03/05/2016  Decreased Interest 0 0  Down, Depressed, Hopeless 0 0  PHQ - 2 Score 0 0    Social History   Tobacco Use  . Smoking status: Former Smoker    Packs/day: 1.00    Years: 12.00    Pack years: 12.00    Types: Cigarettes    Last attempt to quit: 1968   Years since quitting: 51.3  . Smokeless tobacco: Former Network engineer Use Topics  . Alcohol use: No  . Drug use: No    Review of Systems Per HPI unless specifically indicated above     Objective:    BP (!) 154/74 (BP Location: Right Arm, Cuff Size: Normal)   Pulse (!) 54   Temp 97.6 F (36.4 C) (Oral)   Resp 16   Ht 6' (1.829 m)   Wt 227 lb 12.8 oz (103.3 kg)   BMI 30.90 kg/m   Wt Readings from Last 3 Encounters:  05/05/17 227 lb 12.8 oz (103.3 kg)  04/28/17 227 lb (103 kg)  03/12/17 231 lb (104.8 kg)    Physical Exam  Constitutional: He is oriented to person, place, and time. He appears well-developed and well-nourished. No distress.  Currently well appearing elderly male, comfortable, cooperative  HENT:  Mouth/Throat: Oropharynx is clear and moist.  Eyes: Conjunctivae are normal. Right eye exhibits no discharge. Left eye exhibits no discharge.  Cardiovascular: Normal rate.  Pulmonary/Chest: Effort normal.  Abdominal: Soft. Bowel sounds are normal. He exhibits no distension (Improved abdominal distention) and no mass. There is no tenderness. There is no rebound and no guarding.  Musculoskeletal: He exhibits edema (- unchanged - stable edema lower ext w/o worsening).  Improvement now today able to stand from seated position easily, with assistance of his cane.  Gait is improved. Not antalgic.  Neurological: He is alert and  oriented to person, place, and time.  Skin: Skin is warm and dry. No rash noted. He is not diaphoretic. No erythema.  Psychiatric: He has a normal mood and affect. His behavior is normal.  Well groomed, good eye contact, normal speech and thoughts  Nursing note and vitals reviewed.  Results for orders placed or performed in visit on 08/15/16  Microscopic Examination  Result Value Ref Range   WBC, UA 0-5 0 - 5 /hpf   RBC, UA 0-2 0 - 2 /hpf   Epithelial Cells (non renal) None seen 0 - 10 /hpf   Mucus, UA Present (A) Not Estab.   Bacteria, UA  None seen None seen/Few  Urinalysis, Complete  Result Value Ref Range   Specific Gravity, UA 1.015 1.005 - 1.030   pH, UA 7.0 5.0 - 7.5   Color, UA Yellow Yellow   Appearance Ur Clear Clear   Leukocytes, UA Negative Negative   Protein, UA Negative Negative/Trace   Glucose, UA Negative Negative   Ketones, UA Negative Negative   RBC, UA Negative Negative   Bilirubin, UA Negative Negative   Urobilinogen, Ur 0.2 0.2 - 1.0 mg/dL   Nitrite, UA Negative Negative   Microscopic Examination See below:       Assessment & Plan:   Problem List Items Addressed This Visit    Generalized weakness    Significantly improved Previous concerns due to sedentary lifestyle and muscle weakness Proceed with plan to inc regular activity Future follow-up as planned at annual phys if need - additionally if still having weakness consider referral to PT      Hypertension - Primary    Mildly elevated initial BP again, repeat manual check improved. But still elevated - Home BP readings reportedly normal to elevated [phone]   No known complications  Infrequent use of Lasix (rarely now)    Plan:  1. Continue current BP regimen - Enalapril 20mg  daily 2. Encourage improved lifestyle - low sodium diet, improve regular exercise 3. Continue monitor BP outside office, bring readings to next visit, if persistently >140/90 or new symptoms notify office sooner 4. Follow-up >1 month for annual and labs, re-check BP and if still elevated BP will likely add HCTZ 12.5mg  then adjust as needed       Other Visit Diagnoses    Diarrhea, unspecified type       RESOLVED       No orders of the defined types were placed in this encounter.   Follow up plan: Return if symptoms worsen or fail to improve, for keep apt in June as scheduled.  [name], [facility] 05/05/2017, 10:24 PM

## 2017-05-05 NOTE — Assessment & Plan Note (Signed)
Mildly elevated initial BP again, repeat manual check improved. But still elevated - Home BP readings reportedly normal to elevated [phone]   No known complications  Infrequent use of Lasix (rarely now)    Plan:  1. Continue current BP regimen - Enalapril 20mg  daily 2. Encourage improved lifestyle - low sodium diet, improve regular exercise 3. Continue monitor BP outside office, bring readings to next visit, if persistently >140/90 or new symptoms notify office sooner 4. Follow-up >1 month for annual and labs, re-check BP and if still elevated BP will likely add HCTZ 12.5mg  then adjust as needed

## 2017-05-05 NOTE — Patient Instructions (Addendum)
Thank you for coming to the office today.  Glad diarrhea and weakness have resolved.  BP is elevated still, at next visit 06/2017 - if still elevated, will add HCTZ - hydrochlorothiazide BP pill in addition to your enalapril  Please schedule a Follow-up Appointment to: Return if symptoms worsen or fail to improve, for keep apt in June as scheduled.  If you have any other questions or concerns, please feel free to call the office or send a message through [place]. You may also schedule an earlier appointment if necessary.  Additionally, you may be receiving a survey about your experience at our office within a few days to 1 week by e-mail or mail. We value your feedback.  [name], DO [place]

## 2017-05-07 ENCOUNTER — Emergency Department: Payer: Medicare Other

## 2017-05-07 ENCOUNTER — Encounter: Payer: Self-pay | Admitting: Emergency Medicine

## 2017-05-07 ENCOUNTER — Emergency Department
Admission: EM | Admit: 2017-05-07 | Discharge: 2017-05-08 | Disposition: A | Discharge status: Home / Self Care | Payer: Medicare Other | Attending: Emergency Medicine | Admitting: Emergency Medicine

## 2017-05-07 ENCOUNTER — Other Ambulatory Visit: Payer: Self-pay

## 2017-05-07 DIAGNOSIS — R300 Dysuria: Secondary | ICD-10-CM | POA: Diagnosis not present

## 2017-05-07 DIAGNOSIS — R1110 Vomiting, unspecified: Secondary | ICD-10-CM | POA: Diagnosis not present

## 2017-05-07 DIAGNOSIS — R55 Syncope and collapse: Secondary | ICD-10-CM | POA: Diagnosis present

## 2017-05-07 DIAGNOSIS — R600 Localized edema: Secondary | ICD-10-CM | POA: Insufficient documentation

## 2017-05-07 DIAGNOSIS — R319 Hematuria, unspecified: Secondary | ICD-10-CM | POA: Diagnosis not present

## 2017-05-07 DIAGNOSIS — Z79899 Other long term (current) drug therapy: Secondary | ICD-10-CM | POA: Insufficient documentation

## 2017-05-07 DIAGNOSIS — I10 Essential (primary) hypertension: Secondary | ICD-10-CM | POA: Insufficient documentation

## 2017-05-07 DIAGNOSIS — Z87891 Personal history of nicotine dependence: Secondary | ICD-10-CM | POA: Diagnosis not present

## 2017-05-07 DIAGNOSIS — R079 Chest pain, unspecified: Secondary | ICD-10-CM | POA: Insufficient documentation

## 2017-05-07 LAB — BASIC METABOLIC PANEL
Anion gap: 8 (ref 5–15)
BUN: 20 mg/dL (ref 6–20)
CO2: 29 mmol/L (ref 22–32)
Calcium: 8.7 mg/dL — ABNORMAL LOW (ref 8.9–10.3)
Chloride: 103 mmol/L (ref 101–111)
Creatinine, Ser: 0.88 mg/dL (ref 0.61–1.24)
GFR calc Af Amer: >60 mL/min (ref >60)
GFR calc non Af Amer: >60 mL/min (ref >60)
Glucose, Bld: 98 mg/dL (ref 65–99)
Potassium: 3.9 mmol/L (ref 3.5–5.1)
Sodium: 140 mmol/L (ref 135–145)

## 2017-05-07 LAB — CBC WITH DIFFERENTIAL/PLATELET
Basophils Absolute: 0 10*3/uL (ref 0–0.1)
Basophils Relative: 1 %
Eosinophils Absolute: 0.1 10*3/uL (ref 0–0.7)
Eosinophils Relative: 2 %
HCT: 40.1 % (ref 40.0–52.0)
Hemoglobin: 13.6 g/dL (ref 13.0–18.0)
Lymphocytes Relative: 28 %
Lymphs Abs: 1.8 10*3/uL (ref 1.0–3.6)
MCH: 30.4 pg (ref 26.0–34.0)
MCHC: 33.9 g/dL (ref 32.0–36.0)
MCV: 89.8 fL (ref 80.0–100.0)
Monocytes Absolute: 0.5 10*3/uL (ref 0.2–1.0)
Monocytes Relative: 8 %
Neutro Abs: 3.9 10*3/uL (ref 1.4–6.5)
Neutrophils Relative %: 61 %
Platelets: 213 10*3/uL (ref 150–440)
RBC: 4.46 MIL/uL (ref 4.40–5.90)
RDW: 14.3 % (ref 11.5–14.5)
WBC: 6.4 10*3/uL (ref 3.8–10.6)

## 2017-05-07 LAB — TROPONIN I: Troponin I: <0.03 ng/mL (ref <0.03)

## 2017-05-07 NOTE — ED Notes (Signed)
Patient bladder scanned at this time and 1266ml remaining in the bladder.  Patient states that he was unable to void completely due to laying down and using the urinal.  Patient assisted to the toilet where he completed voiding and had a post void scan of 30 ml.  MD made aware.

## 2017-05-07 NOTE — ED Notes (Signed)
ED Provider at bedside. 

## 2017-05-07 NOTE — Discharge Instructions (Addendum)
Drink plenty of fluids daily.  Continue all medicines as directed by your doctor.  Return to the ER for recurrent or worsening symptoms, persistent vomiting, difficulty breathing or other concerns.

## 2017-05-07 NOTE — ED Provider Notes (Signed)
[facility] Emergency Department Provider Note  ___________________________________________   First MD Initiated Contact with Patient 05/07/17 2017     (approximate)  I have reviewed the triage vital signs and the nursing notes.   HISTORY  Chief Complaint Weakness   HPI Randy [name] is a 83 y.o. male with a history of hypertension as well as bilateral lower extremity edema who is presenting to the emergency department today after near syncopal episode.  He says that he was getting up to make himself a plate of food when he began to feel lightheaded and then vomited.  No reports of complete loss of consciousness.  EMS reports that the patient was pale when they arrived.  However, his blood pressure was elevated and he never had hypotension.  No orthostatic changes for EMS.  Patient denies any chest pain although he said that he did feel some burning in his chest when he was vomiting.  Says that his peripheral edema is at its baseline.  Says that he has not eaten since this morning but he usually does not eat lunch and has been drinking fluids as he does normally.  Michela Pitcher that he has had a diarrheal illness approximately 1 week ago but this has since resolved.  Otherwise has not been feeling ill as of late.   Past Medical History:  Diagnosis Date  . GERD (gastroesophageal reflux disease)   . Hyperlipidemia   . Hypertension   . Osteoporosis     Patient Active Problem List   Diagnosis Date Noted  . Generalized weakness 04/28/2017  . Chronic low back pain 03/12/2017  . Knee pain, chronic 10/01/2016  . Chronic venous insufficiency 08/30/2016  . BPH without obstruction/lower urinary tract symptoms 03/06/2016  . Osteoarthritis of multiple joints 03/06/2016  . Degenerative joint disease (DJD) of lumbar spine 03/06/2016  . Hypertension 03/05/2016  . Hyperlipidemia 03/05/2016  . Osteoporosis 03/05/2016  . GERD (gastroesophageal reflux disease) 03/05/2016  .  Bilateral lower extremity edema 03/05/2016    Past Surgical History:  Procedure Laterality Date  . GANGLION CYST EXCISION Left 2013   Left upper arm  . HERNIA REPAIR N/A 2008   MIdline, ventral acquired hernia repair    Prior to Admission medications   Medication Sig Start Date End Date Taking? Authorizing Provider  cholecalciferol (VITAMIN D) 1000 units tablet Take 1,000 Units by mouth daily.    [provider]  Coenzyme Q10 (CO Q 10) 100 MG CAPS Take by mouth.    [provider]  enalapril (VASOTEC) 20 MG tablet Take 1 tablet (20 mg total) by mouth daily. 09/11/16   Karamalegos, Devonne Doughty, DO  fluticasone (FLONASE) 50 MCG/ACT nasal spray fluticasone propionate 50 mcg/actuation nasal spray,suspension    [provider]  furosemide (LASIX) 20 MG tablet Take 1 tablet (20 mg total) by mouth daily as needed for edema. For 1 week take 2 pills in morning then resume normal dose. 07/31/16   Karamalegos, Devonne Doughty, DO  meloxicam (MOBIC) 15 MG tablet Take 1 tablet (15 mg total) by mouth daily. Take 1-2 weeks at a time then daily PRN 03/12/17   Karamalegos, Devonne Doughty, DO  Omega-3 Fatty Acids (FISH OIL) 1200 MG CAPS Take by mouth.    [provider]  ranitidine (ZANTAC) 150 MG tablet TAKE 1 TABLET BY MOUTH TWICE A DAY 05/05/17   Karamalegos, Devonne Doughty, DO    Allergies Antihistamines, chlorpheniramine-type  Family History  Problem Relation Age of Onset  . Breast cancer  Mother   . Lung cancer Father   . Heart disease Brother   . Leukemia Brother   . Prostate cancer Neg Hx   . Colon cancer Neg Hx   . Bladder Cancer Neg Hx   . Kidney cancer Neg Hx     Social History Social History   Tobacco Use  . Smoking status: Former Smoker    Packs/day: 1.00    Years: 12.00    Pack years: 12.00    Types: Cigarettes    Last attempt to quit: 1968    Years since quitting: 51.3  . Smokeless tobacco: Former Network engineer Use Topics  . Alcohol use: No  .  Drug use: No    Review of Systems  Constitutional: No fever/chills Eyes: No visual changes. ENT: No sore throat. Cardiovascular: As above. Respiratory: Denies shortness of breath. Gastrointestinal: No abdominal pain. No constipation. Genitourinary: Negative for dysuria. Musculoskeletal: Negative for back pain. Skin: Negative for rash. Neurological: Negative for headaches, focal weakness or numbness.   ____________________________________________   PHYSICAL EXAM:  VITAL SIGNS: ED Triage Vitals  Enc Vitals Group     BP --      Pulse Rate 05/07/17 2014 63     Resp 05/07/17 2014 18     Temp 05/07/17 2014 97.6 F (36.4 C)     Temp Source 05/07/17 2014 Oral     SpO2 05/07/17 2014 98 %     Weight 05/07/17 2012 227 lb (103 kg)     Height 05/07/17 2012 6' (1.829 m)     Head Circumference --      Peak Flow --      Pain Score 05/07/17 2012 0     Pain Loc --      Pain Edu? --      Excl. in [place]? --     Constitutional: Alert and oriented. Well appearing and in no acute distress. Eyes: Conjunctivae are normal.  Head: Atraumatic. Nose: No congestion/rhinnorhea. Mouth/Throat: Mucous membranes are moist.  Neck: No stridor.   Cardiovascular: Normal rate, regular rhythm. Grossly normal heart sounds.   Respiratory: Normal respiratory effort.  No retractions. Lungs CTAB. Gastrointestinal: Soft and nontender. No distention.  Musculoskeletal: Mild to moderate bilateral lower extremity edema.  No joint effusions. Neurologic:  Normal speech and language. No gross focal neurologic deficits are appreciated. Skin:  Skin is warm, dry and intact. No rash noted. Psychiatric: Mood and affect are normal. Speech and behavior are normal.  ____________________________________________   LABS (all labs ordered are listed, but only abnormal results are displayed)  Labs Reviewed  CBC WITH DIFFERENTIAL/PLATELET  BASIC METABOLIC PANEL  TROPONIN I  URINALYSIS, COMPLETE (UACMP) WITH MICROSCOPIC    TROPONIN I   ____________________________________________  EKG  ED ECG REPORT I, Doran Stabler, the attending physician, personally viewed and interpreted this ECG.   Date: 05/07/2017  EKG Time: 2012  Rate: 58  Rhythm: normal sinus rhythm  Axis: Normal  Intervals:left anterior fascicular block  ST&T Change: No ST segment elevation or depression.  No abnormal T wave inversion.  ____________________________________________  RADIOLOGY  Opacity at the left base compatible with atelectasis or pneumonia. ___________________________________________   PROCEDURES  Procedure(s) performed:   Procedures  Critical Care performed:   ____________________________________________   INITIAL IMPRESSION / ASSESSMENT AND PLAN / ED COURSE  Pertinent labs & imaging results that were available during my care of the patient were reviewed by me and considered in my medical decision making (see chart for details).  DDX: Near syncope, chest pain, nausea and vomiting, vasovagal syncope, UTI, less short abnormality As part of my medical decision making, I reviewed the following data within the [place] Notes from prior ED visits ----------------------------------------- 10:25 PM on 05/07/2017 -----------------------------------------   Patient continues to be without complaint of any further chest pain or shortness of breath or lightheadedness.  However, he did urinate frank blood but was able to void completely with a postvoid residual of 0 mL.  ----------------------------------------- 11:07 PM on 05/07/2017 -----------------------------------------  Patient pending second troponin as well as urinalysis.  Signed out to Dr. Beather Arbour.  Feel that the chest x-ray is likely consistent with atelectasis.  Patient not complaining of cough, fever.  Normal white blood cell count. ____________________________________________   FINAL CLINICAL IMPRESSION(S) / ED  DIAGNOSES  Near syncope.  Hematuria.    NEW MEDICATIONS STARTED DURING THIS VISIT:  New Prescriptions   No medications on file     Note:  This document was prepared using Dragon voice recognition software and may include unintentional dictation errors.     Orbie Pyo, MD 05/07/17 2308

## 2017-05-07 NOTE — ED Notes (Signed)
MD made aware that patients urine had blood present in it and that he had difficulty and painful urination with it.  Per MD, patient is to be bladder scanned to see if there is any residual urine.

## 2017-05-07 NOTE — ED Triage Notes (Signed)
Pt comes into the ED via EMS from home c/o weakness when he stood up from the table to fix his plate today.  No syncopal episode noted and the patient's family was able to help him get seated. Orthostatic negative with EMS.  Patient had some mild nausea and vomiting but he has a h/o reflux. Patient bradycardic with EMS at mid 33's.  Patient in NAD at this time with even and unlabored respirations and denies any pain.

## 2017-05-07 NOTE — ED Notes (Signed)
Lab called and informed this RN that they are unable to use the urine sample sent down due to improper label printing that did not have the patient's name on it.  MRN on place of label, but they are still unable to accept it and run it.  MD notified.

## 2017-05-08 LAB — URINALYSIS, COMPLETE (UACMP) WITH MICROSCOPIC
Bacteria, UA: NONE SEEN
Bilirubin Urine: NEGATIVE
Glucose, UA: NEGATIVE mg/dL
Ketones, ur: NEGATIVE mg/dL
Leukocytes, UA: NEGATIVE
Nitrite: NEGATIVE
Protein, ur: NEGATIVE mg/dL
RBC / HPF: >50 RBC/hpf — ABNORMAL HIGH (ref 0–5)
Specific Gravity, Urine: 1.012 (ref 1.005–1.030)
Squamous Epithelial / LPF: NONE SEEN (ref 0–5)
pH: 6 (ref 5.0–8.0)

## 2017-05-08 LAB — TROPONIN I: Troponin I: <0.03 ng/mL (ref <0.03)

## 2017-05-08 NOTE — ED Provider Notes (Signed)
-----------------------------------------   12:28 AM on 05/08/2017 -----------------------------------------  Repeat troponin remains negative.  Urinalysis demonstrates microscopic hematuria.  Looks like he has been seen at [facility] in August 2018 for gross hematuria.  Will ask patient to follow-up with his urologist.  He will follow-up with his PCP closely.  Strict return precautions given.  Patient verbalizes understanding and agrees with plan of care.   Paulette Blanch, MD 05/08/17 [phone]

## 2017-06-09 ENCOUNTER — Other Ambulatory Visit: Payer: Medicare Other

## 2017-06-09 ENCOUNTER — Telehealth: Payer: Self-pay | Admitting: Family Medicine

## 2017-06-09 DIAGNOSIS — E782 Mixed hyperlipidemia: Secondary | ICD-10-CM

## 2017-06-09 DIAGNOSIS — N400 Enlarged prostate without lower urinary tract symptoms: Secondary | ICD-10-CM

## 2017-06-09 DIAGNOSIS — D649 Anemia, unspecified: Secondary | ICD-10-CM

## 2017-06-09 DIAGNOSIS — I10 Essential (primary) hypertension: Secondary | ICD-10-CM

## 2017-06-09 DIAGNOSIS — R7309 Other abnormal glucose: Secondary | ICD-10-CM

## 2017-06-09 DIAGNOSIS — Z125 Encounter for screening for malignant neoplasm of prostate: Secondary | ICD-10-CM

## 2017-06-09 NOTE — Telephone Encounter (Signed)
Pt needs a refill on ranitidine sent to [facility].

## 2017-06-10 ENCOUNTER — Other Ambulatory Visit: Payer: Self-pay

## 2017-06-10 DIAGNOSIS — K219 Gastro-esophageal reflux disease without esophagitis: Secondary | ICD-10-CM

## 2017-06-10 LAB — COMPLETE METABOLIC PANEL WITH GFR
AG Ratio: 1.5 (calc) (ref 1.0–2.5)
ALT: 4 U/L — ABNORMAL LOW (ref 9–46)
AST: 10 U/L (ref 10–35)
Albumin: 4 g/dL (ref 3.6–5.1)
Alkaline phosphatase (APISO): 58 U/L (ref 40–115)
BUN: 19 mg/dL (ref 7–25)
CO2: 31 mmol/L (ref 20–32)
Calcium: 8.9 mg/dL (ref 8.6–10.3)
Chloride: 103 mmol/L (ref 98–110)
Creat: 0.85 mg/dL (ref 0.70–1.11)
GFR, Est African American: 95 mL/min/{1.73_m2} (ref >=60)
GFR, Est Non African American: 82 mL/min/{1.73_m2} (ref >=60)
Globulin: 2.6 g/dL (calc) (ref 1.9–3.7)
Glucose, Bld: 96 mg/dL (ref 65–99)
Potassium: 4.2 mmol/L (ref 3.5–5.3)
Sodium: 140 mmol/L (ref 135–146)
Total Bilirubin: 0.8 mg/dL (ref 0.2–1.2)
Total Protein: 6.6 g/dL (ref 6.1–8.1)

## 2017-06-10 LAB — PSA, TOTAL WITH REFLEX TO PSA, FREE: PSA, Total: 3.2 ng/mL (ref <=4.0)

## 2017-06-10 LAB — LIPID PANEL
Cholesterol: 210 mg/dL — ABNORMAL HIGH (ref <200)
HDL: 34 mg/dL — ABNORMAL LOW (ref >40)
LDL Cholesterol (Calc): 153 mg/dL (calc) — ABNORMAL HIGH
Non-HDL Cholesterol (Calc): 176 mg/dL (calc) — ABNORMAL HIGH (ref <130)
Total CHOL/HDL Ratio: 6.2 (calc) — ABNORMAL HIGH (ref <5.0)
Triglycerides: 116 mg/dL (ref <150)

## 2017-06-10 LAB — CBC WITH DIFFERENTIAL/PLATELET
Basophils Absolute: 59 cells/uL (ref 0–200)
Basophils Relative: 1 %
Eosinophils Absolute: 100 cells/uL (ref 15–500)
Eosinophils Relative: 1.7 %
HCT: 40 % (ref 38.5–50.0)
Hemoglobin: 13.4 g/dL (ref 13.2–17.1)
Lymphs Abs: 1552 cells/uL (ref 850–3900)
MCH: 30.4 pg (ref 27.0–33.0)
MCHC: 33.5 g/dL (ref 32.0–36.0)
MCV: 90.7 fL (ref 80.0–100.0)
MPV: 10.4 fL (ref 7.5–12.5)
Monocytes Relative: 8.3 %
Neutro Abs: 3699 cells/uL (ref 1500–7800)
Neutrophils Relative %: 62.7 %
Platelets: 201 10*3/uL (ref 140–400)
RBC: 4.41 10*6/uL (ref 4.20–5.80)
RDW: 13.3 % (ref 11.0–15.0)
Total Lymphocyte: 26.3 %
WBC mixed population: 490 cells/uL (ref 200–950)
WBC: 5.9 10*3/uL (ref 3.8–10.8)

## 2017-06-10 LAB — HEMOGLOBIN A1C
Hgb A1c MFr Bld: 5.4 % of total Hgb (ref <5.7)
Mean Plasma Glucose: 108 (calc)
eAG (mmol/L): 6 (calc)

## 2017-06-10 MED ORDER — RANITIDINE HCL 150 MG PO TABS: 150.0000 mg | ORAL_TABLET | Freq: Two times a day (BID) | ORAL | 0 refills | Status: DC

## 2017-06-10 NOTE — Telephone Encounter (Signed)
Refill send. 

## 2017-06-16 ENCOUNTER — Encounter: Payer: Medicare Other | Admitting: Family Medicine

## 2017-06-16 ENCOUNTER — Encounter: Payer: Self-pay | Admitting: Family Medicine

## 2017-06-16 ENCOUNTER — Ambulatory Visit (INDEPENDENT_AMBULATORY_CARE_PROVIDER_SITE_OTHER): Payer: Medicare Other | Admitting: Family Medicine

## 2017-06-16 VITALS — BP 145/70 | HR 54 | Temp 97.7°F | Resp 16 | Ht 72.0 in | Wt 231.0 lb

## 2017-06-16 DIAGNOSIS — I10 Essential (primary) hypertension: Secondary | ICD-10-CM | POA: Diagnosis not present

## 2017-06-16 DIAGNOSIS — H60543 Acute eczematoid otitis externa, bilateral: Secondary | ICD-10-CM | POA: Diagnosis not present

## 2017-06-16 DIAGNOSIS — E782 Mixed hyperlipidemia: Secondary | ICD-10-CM

## 2017-06-16 DIAGNOSIS — S50811A Abrasion of right forearm, initial encounter: Secondary | ICD-10-CM

## 2017-06-16 DIAGNOSIS — K219 Gastro-esophageal reflux disease without esophagitis: Secondary | ICD-10-CM

## 2017-06-16 DIAGNOSIS — Z0000 Encounter for general adult medical examination without abnormal findings: Secondary | ICD-10-CM

## 2017-06-16 MED ORDER — ENALAPRIL MALEATE 20 MG PO TABS: 20.0000 mg | ORAL_TABLET | Freq: Two times a day (BID) | ORAL | 3 refills | Status: DC

## 2017-06-16 MED ORDER — HYDROCORTISONE-ACETIC ACID 1-2 % OT SOLN: 3.0000 [drp] | Freq: Three times a day (TID) | OTIC | 0 refills | Status: DC

## 2017-06-16 MED ORDER — ROSUVASTATIN CALCIUM 5 MG PO TABS: 5.0000 mg | ORAL_TABLET | ORAL | 2 refills | Status: DC

## 2017-06-16 MED ORDER — RANITIDINE HCL 150 MG PO TABS: 150.0000 mg | ORAL_TABLET | Freq: Two times a day (BID) | ORAL | 3 refills | Status: DC

## 2017-06-16 MED ORDER — MUPIROCIN 2 % EX OINT: 1.0000 "application " | TOPICAL_OINTMENT | Freq: Two times a day (BID) | CUTANEOUS | 0 refills | Status: DC

## 2017-06-16 NOTE — Progress Notes (Signed)
Subjective:    Patient ID: Randy [name], male    DOB: Sep 22, 1934, 83 y.o.   MRN: [id number]  Randy [name] is a 83 y.o. male presenting on 06/16/2017 for Annual Exam and Hypertension   HPI   Here for Annual Physical  CHRONIC HTN: Reports no new concerns, he checks his BP regularly at home, he has had some normal BP readings. In past his BP was elevated we consider adjusting meds. He does not have blood pressure log Current Meds - Enalapril 20mg  daily - No longer taking Furosemide regularly, only PRN now, and rarely takes   Reports good compliance, took meds today. Tolerating well, w/o complaints. Lifestyle: - Exercise: improved walking  Skin Abrasions, superficial Reports a few spots on forearms, he takes ASA 81mg  daily, and he has some easy bruising, he does not recall injury or problem, he has noticed a few skin sores or spots, question if bug bite, most healing, he has one new one on R forearm. Not putting anything on them. Non tender, no drainage. Denies fever or chills  Recurrent Eczematoid Otitis / Ear Pain and Itching, L>R Previously seen for same problem back in 02/2017, he was treated with Hydrocortisone-Acetic Acid ear drops, with good resolution of this problem. He did not use wick last time. - Now he reports recurrence of same issue about 3-4 weeks ago recurrence of same issue, now Left ear worse than Right, feels some fluid and fullness and itchy and some skin flaking. - Never seen ENT. In past used hearing aids, none recently. - Denies fevers, chills, sinus pain or pressure, congestion or drainage, cough,dizziness lightheaded, headache, vertigo  Health Maintenance: UTD currently Pneumonia vaccine, next due for Pneumovax-23 after 09/11/17 UTD TDap  Depression screen [facility] 2/9 06/16/2017 11/26/2016 03/05/2016  Decreased Interest 0 0 0  Down, Depressed, Hopeless 0 0 0  PHQ - 2 Score 0 0 0    Past Medical History:  Diagnosis Date  . GERD (gastroesophageal reflux disease)   .  Hyperlipidemia   . Hypertension   . Osteoporosis    Past Surgical History:  Procedure Laterality Date  . GANGLION CYST EXCISION Left 2013   Left upper arm  . HERNIA REPAIR N/A 2008   MIdline, ventral acquired hernia repair   Social History   Socioeconomic History  . Marital status: Married    Spouse name: Not on file  . Number of children: Not on file  . Years of education: College  . Highest education level: Not on file  Occupational History  . Occupation: Retired Arboriculturist  . Financial resource strain: Not hard at all  . Food insecurity:    Worry: Never true    Inability: Never true  . Transportation needs:    Medical: No    Non-medical: No  Tobacco Use  . Smoking status: Former Smoker    Packs/day: 1.00    Years: 12.00    Pack years: 12.00    Types: Cigarettes    Last attempt to quit: 1968    Years since quitting: 51.4  . Smokeless tobacco: Former Network engineer and Sexual Activity  . Alcohol use: No  . Drug use: No  . Sexual activity: Not on file  Lifestyle  . Physical activity:    Days per week: 0 days    Minutes per session: 0 min  . Stress: Not at all  Relationships  . Social connections:    Talks on phone: Once a week  Gets together: Once a week    Attends religious service: 1 to 4 times per year    Active member of club or organization: Yes    Attends meetings of clubs or organizations: More than 4 times per year    Relationship status: Married  . Intimate partner violence:    Fear of current or ex partner: No    Emotionally abused: No    Physically abused: No    Forced sexual activity: No  Other Topics Concern  . Not on file  Social History Narrative  . Not on file   Family History  Problem Relation Age of Onset  . Breast cancer Mother   . Lung cancer Father   . Heart disease Brother   . Leukemia Brother   . Prostate cancer Neg Hx   . Colon cancer Neg Hx   . Bladder Cancer Neg Hx   . Kidney cancer Neg Hx     Current Outpatient Medications on File Prior to Visit  Medication Sig  . aspirin EC 81 MG tablet Take 81 mg by mouth daily.  . cholecalciferol (VITAMIN D) 1000 units tablet Take 1,000 Units by mouth daily.  . Coenzyme Q10 (CO Q 10) 100 MG CAPS Take by mouth.  . fluticasone (FLONASE) 50 MCG/ACT nasal spray fluticasone propionate 50 mcg/actuation nasal spray,suspension  . furosemide (LASIX) 20 MG tablet Take 1 tablet (20 mg total) by mouth daily as needed for edema. For 1 week take 2 pills in morning then resume normal dose.  . meloxicam (MOBIC) 15 MG tablet Take 1 tablet (15 mg total) by mouth daily. Take 1-2 weeks at a time then daily PRN  . Omega-3 Fatty Acids (FISH OIL) 1200 MG CAPS Take by mouth.   No current facility-administered medications on file prior to visit.     Review of Systems  Constitutional: Negative for activity change, appetite change, chills, diaphoresis, fatigue and fever.  HENT: Positive for ear discharge (ear itching). Negative for congestion, ear pain, facial swelling, hearing loss, rhinorrhea, sinus pressure and sinus pain.   Eyes: Negative for visual disturbance.  Respiratory: Negative for apnea, cough, choking, chest tightness, shortness of breath and wheezing.   Cardiovascular: Positive for leg swelling (improved on compression). Negative for chest pain and palpitations.  Gastrointestinal: Negative for abdominal pain, anal bleeding, blood in stool, constipation, diarrhea, nausea and vomiting.  Endocrine: Negative for cold intolerance.  Genitourinary: Negative for decreased urine volume, difficulty urinating, dysuria, frequency, hematuria, testicular pain and urgency.  Musculoskeletal: Negative for arthralgias, back pain and neck pain.  Skin: Negative for rash.  Allergic/Immunologic: Negative for environmental allergies.  Neurological: Negative for dizziness, weakness, light-headedness, numbness and headaches.  Hematological: Negative for adenopathy.  Bruises/bleeds easily.  Psychiatric/Behavioral: Negative for behavioral problems, dysphoric mood and sleep disturbance. The patient is not nervous/anxious.    Per HPI unless specifically indicated above     Objective:    BP (!) 145/70 (BP Location: Right Arm, Cuff Size: Normal)   Pulse (!) 54   Temp 97.7 F (36.5 C) (Oral)   Resp 16   Ht 6' (1.829 m)   Wt 231 lb (104.8 kg)   BMI 31.33 kg/m   Wt Readings from Last 3 Encounters:  06/16/17 231 lb (104.8 kg)  05/07/17 227 lb (103 kg)  05/05/17 227 lb 12.8 oz (103.3 kg)    Physical Exam  Constitutional: He is oriented to person, place, and time. He appears well-developed and well-nourished. No distress.  Chronically ill but currently  well appearing 83 year old male, comfortable, cooperative, has cane  HENT:  Head: Normocephalic and atraumatic.  Mouth/Throat: Oropharynx is clear and moist.  Frontal / maxillary sinuses non-tender. Nares patent without purulence or edema. Bilateral TMs clear without erythema, effusion or bulging.  Bilateral external ears with significant dry flaking skin with patchy fibrotic areas within external ear canal, Right worse than Left, with debris inside canal as well. No erythema, non tender.  Oropharynx clear without erythema, exudates, edema or asymmetry.  Eyes: Pupils are equal, round, and reactive to light. Conjunctivae and EOM are normal. Right eye exhibits no discharge. Left eye exhibits no discharge.  Neck: Normal range of motion. Neck supple. No thyromegaly present.  Cardiovascular: Normal rate, regular rhythm, normal heart sounds and intact distal pulses.  No murmur heard. Pulmonary/Chest: Effort normal and breath sounds normal. No respiratory distress. He has no wheezes. He has no rales.  Abdominal: Soft. Bowel sounds are normal. He exhibits no distension and no mass. There is no tenderness.  Musculoskeletal: Normal range of motion. He exhibits edema (+1 pitting edema, improved w/ compression  stockings bilateral). He exhibits no tenderness.  Distal extremity muscle strength intact.  Some difficulty with standing from seated, but able to do this on his own with assistance of cane only. Improved  Lymphadenopathy:    He has no cervical adenopathy.  Neurological: He is alert and oriented to person, place, and time.  Distal sensation intact to light touch all extremities  Skin: Skin is warm and dry. No rash noted. He is not diaphoretic. No erythema.  Psychiatric: He has a normal mood and affect. His behavior is normal.  Well groomed, good eye contact, normal speech and thoughts  Nursing note and vitals reviewed.  Results for orders placed or performed in visit on 06/09/17  PSA, Total with Reflex to PSA, Free  Result Value Ref Range   PSA, Total 3.2 < OR = 4.0 ng/mL  CBC with Differential/Platelet  Result Value Ref Range   WBC 5.9 3.8 - 10.8 Thousand/uL   RBC 4.41 4.20 - 5.80 Million/uL   Hemoglobin 13.4 13.2 - 17.1 g/dL   HCT 40.0 38.5 - 50.0 %   MCV 90.7 80.0 - 100.0 fL   MCH 30.4 27.0 - 33.0 pg   MCHC 33.5 32.0 - 36.0 g/dL   RDW 13.3 11.0 - 15.0 %   Platelets 201 140 - 400 Thousand/uL   MPV 10.4 7.5 - 12.5 fL   Neutro Abs 3,699 1,500 - 7,800 cells/uL   Lymphs Abs 1,552 850 - 3,900 cells/uL   WBC mixed population 490 200 - 950 cells/uL   Eosinophils Absolute 100 15 - 500 cells/uL   Basophils Absolute 59 0 - 200 cells/uL   Neutrophils Relative % 62.7 %   Total Lymphocyte 26.3 %   Monocytes Relative 8.3 %   Eosinophils Relative 1.7 %   Basophils Relative 1.0 %  Hemoglobin A1c  Result Value Ref Range   Hgb A1c MFr Bld 5.4 <5.7 % of total Hgb   Mean Plasma Glucose 108 (calc)   eAG (mmol/L) 6.0 (calc)  Lipid panel  Result Value Ref Range   Cholesterol 210 (H) <200 mg/dL   HDL 34 (L) >40 mg/dL   Triglycerides 116 <150 mg/dL   LDL Cholesterol (Calc) 153 (H) mg/dL (calc)   Total CHOL/HDL Ratio 6.2 (H) <5.0 (calc)   Non-HDL Cholesterol (Calc) 176 (H) <130 mg/dL  (calc)  COMPLETE METABOLIC PANEL WITH GFR  Result Value Ref Range  Glucose, Bld 96 65 - 99 mg/dL   BUN 19 7 - 25 mg/dL   Creat 0.85 0.70 - 1.11 mg/dL   GFR, Est Non African American 82 > OR = 60 mL/min/1.69m2   GFR, Est African American 95 > OR = 60 mL/min/1.77m2   BUN/Creatinine Ratio NOT APPLICABLE 6 - 22 (calc)   Sodium 140 135 - 146 mmol/L   Potassium 4.2 3.5 - 5.3 mmol/L   Chloride 103 98 - 110 mmol/L   CO2 31 20 - 32 mmol/L   Calcium 8.9 8.6 - 10.3 mg/dL   Total Protein 6.6 6.1 - 8.1 g/dL   Albumin 4.0 3.6 - 5.1 g/dL   Globulin 2.6 1.9 - 3.7 g/dL (calc)   AG Ratio 1.5 1.0 - 2.5 (calc)   Total Bilirubin 0.8 0.2 - 1.2 mg/dL   Alkaline phosphatase (APISO) 58 40 - 115 U/L   AST 10 10 - 35 U/L   ALT 4 (L) 9 - 46 U/L      Assessment & Plan:   Problem List Items Addressed This Visit    GERD (gastroesophageal reflux disease)    Stable chronic GERD, controlled on H2 blocker - No prior known PUD or esophagitis - No regular NSAID use, some tylenol only  Plan: 1. Refilled chronic Ranitidine 150mg  BID 2. Diet modifications reduce GERD 3. Follow-up as needed consider referral GI and EGD in future if worsening      Relevant Medications   ranitidine (ZANTAC) 150 MG tablet   Hyperlipidemia    Uncontrolled cholesterol on omega 3 Last lipid panel 06/2017 Calculated ASCVD 10 yr risk score elevated >39%  Plan: 1. Discussion on ASCVD risk and agree to restart trial low dose statin - start with lowest Rosuvastatin 5mg  and use x 1 weekly for now - then after few weeks if tolerate increase to 3 times weekly only as prescribed, will adjust refills or amount if doing well 2. Continue ASA 81mg  for primary ASCVD risk reduction 3. Encourage improved lifestyle - low carb/cholesterol, reduce portion size, continue improving regular exercise 4. Follow-up 3-6 months review course - lipids yearly only      Relevant Medications   aspirin EC 81 MG tablet   enalapril (VASOTEC) 20 MG tablet    rosuvastatin (CRESTOR) 5 MG tablet   Hypertension    Mildly elevated initial BP again, repeat manual check improved. But still elevated above goal >140 - Home BP readings reportedly normal to elevated [id number]   No known complications  Infrequent use of Lasix (rarely now)    Plan:  1. INCREASE Enalapril from 20mg  daily to split dosing 20mg  BID - new rx sent to pharmacy 2. Encourage improved lifestyle - low sodium diet, improve regular exercise 3. Continue monitor BP outside office, bring readings to next visit, if persistently >140/90 or new symptoms notify office sooner 4. Follow-up 3 months HTN, future consider switch back to enalapril 20mg  and add HCTZ 12.5mg  or add HCTZ      Relevant Medications   aspirin EC 81 MG tablet   enalapril (VASOTEC) 20 MG tablet   rosuvastatin (CRESTOR) 5 MG tablet    Other Visit Diagnoses    Annual physical exam    -  Primary Updated health maintenance, future pneumonia vaccine 09/2017 Reviewed lab results Encourage improve diet exercise lifestyle    Acute eczematoid otitis externa of both ears   Clinically consistent with recurrence of same otitis externa with eczema/atopic dermatitis with thickening dry flaking skin extending into ear canal.  -  Uncomplicated without sign of secondary infection. No AOM. No significant hearing loss or change (has some stable presbycusis).  Plan Trial on repeat Acetic acid-hydrocortisone otc drops TID x 7-10 days May add OTC Hydrocortisone external ear only Future avoid excess water/moisture try shower cap or ear plugs Follow-up within 1 week as needed - may request referral to ENT if not improving        Relevant Medications   acetic acid-hydrocortisone (VOSOL-HC) OTIC solution   Abrasion of right forearm, initial encounter    No sign of secondary infection, but multiple scratches / abrasions, possible bug bites     Relevant Medications   mupirocin ointment (BACTROBAN) 2 %      Meds ordered this encounter    Medications  . mupirocin ointment (BACTROBAN) 2 %    Sig: Apply 1 application topically 2 (two) times daily. For up to 7-10 days as needed for sores on arms    Dispense:  22 g    Refill:  0  . acetic acid-hydrocortisone (VOSOL-HC) OTIC solution    Sig: Place 3 drops into both ears 3 (three) times daily. For up to 7-10 days until healed    Dispense:  10 mL    Refill:  0  . ranitidine (ZANTAC) 150 MG tablet    Sig: Take 1 tablet (150 mg total) by mouth 2 (two) times daily.    Dispense:  180 tablet    Refill:  3  . enalapril (VASOTEC) 20 MG tablet    Sig: Take 1 tablet (20 mg total) by mouth 2 (two) times daily.    Dispense:  180 tablet    Refill:  3    Dose increased to twice daily now  . rosuvastatin (CRESTOR) 5 MG tablet    Sig: Take 1 tablet (5 mg total) by mouth 3 (three) times a week.    Dispense:  12 tablet    Refill:  2    Follow up plan: Return in about 3 months (around 09/16/2017) for HTN med adjust.  [name], DO [facility] 06/16/2017, 1:15 PM

## 2017-06-16 NOTE — Patient Instructions (Addendum)
Thank you for coming to the office today.  For Eczema / External ear otitis - recommend the following - Start with topical ear drops as prescribed use up to 3 drops 3 times a day - can start with using a kleenex rolled wick or other wick OTC - may apply drops to wick to spread out medicine, do this every 6-8 hours or 3 times a day - May use OTC Hydrocortisone topical steroid on outside of ear for dry flaking skin - After shower in future to prevent problem may try OTC Alcohol Ear Drop / Drying Ear Drops one on each side, or try to avoid water in ears with shower cap or ear plugs  For skin sores on arms - May be bruising due to Aspirin - Looks like abrasion or bug bite - Use new rx topical antibiotic Mupirocin on the skin sores, apply twice daily for 7 to 10 days to help heal and prevent infection  For Blood Pressure Still elevated reading INCREASE Enalapril 20mg  - now instead of once daily in morning, increase to take one pill TWICE a DAY - one in AM and one in PM - New rx sent for increased number of pills  Increased Ranitidine (stomach acid) to a 90 day supply at pharmacy  For Cholesterol - Start Rosuvastatin 5mg  - stat with ONCE A WEEK for 1-2 weeks, then if tolerated, increase up to 3 times a week - MONDAY / [facility] / FRIDAY in future, let me know if need 90 day supply or if cannot tolerate  Please schedule a Follow-up Appointment to: Return in about 3 months (around 09/16/2017) for HTN med adjust.  If you have any other questions or concerns, please feel free to call the office or send a message through [place]. You may also schedule an earlier appointment if necessary.  Additionally, you may be receiving a survey about your experience at our office within a few days to 1 week by e-mail or mail. We value your feedback.  [name], DO [place]

## 2017-06-16 NOTE — Assessment & Plan Note (Signed)
Stable chronic GERD, controlled on H2 blocker - No prior known PUD or esophagitis - No regular NSAID use, some tylenol only  Plan: 1. Refilled chronic Ranitidine 150mg  BID 2. Diet modifications reduce GERD 3. Follow-up as needed consider referral GI and EGD in future if worsening

## 2017-06-16 NOTE — Assessment & Plan Note (Signed)
Mildly elevated initial BP again, repeat manual check improved. But still elevated above goal >140 - Home BP readings reportedly normal to elevated [phone]   No known complications  Infrequent use of Lasix (rarely now)    Plan:  1. INCREASE Enalapril from 20mg  daily to split dosing 20mg  BID - new rx sent to pharmacy 2. Encourage improved lifestyle - low sodium diet, improve regular exercise 3. Continue monitor BP outside office, bring readings to next visit, if persistently >140/90 or new symptoms notify office sooner 4. Follow-up 3 months HTN, future consider switch back to enalapril 20mg  and add HCTZ 12.5mg  or add HCTZ

## 2017-06-16 NOTE — Assessment & Plan Note (Signed)
Uncontrolled cholesterol on omega 3 Last lipid panel 06/2017 Calculated ASCVD 10 yr risk score elevated >39%  Plan: 1. Discussion on ASCVD risk and agree to restart trial low dose statin - start with lowest Rosuvastatin 5mg  and use x 1 weekly for now - then after few weeks if tolerate increase to 3 times weekly only as prescribed, will adjust refills or amount if doing well 2. Continue ASA 81mg  for primary ASCVD risk reduction 3. Encourage improved lifestyle - low carb/cholesterol, reduce portion size, continue improving regular exercise 4. Follow-up 3-6 months review course - lipids yearly only

## 2017-09-06 ENCOUNTER — Other Ambulatory Visit: Payer: Self-pay | Admitting: Family Medicine

## 2017-09-06 DIAGNOSIS — E782 Mixed hyperlipidemia: Secondary | ICD-10-CM

## 2017-10-03 ENCOUNTER — Other Ambulatory Visit: Payer: Self-pay | Admitting: Family Medicine

## 2017-10-03 DIAGNOSIS — M47816 Spondylosis without myelopathy or radiculopathy, lumbar region: Secondary | ICD-10-CM

## 2017-10-07 ENCOUNTER — Ambulatory Visit (INDEPENDENT_AMBULATORY_CARE_PROVIDER_SITE_OTHER): Payer: Medicare Other

## 2017-10-07 DIAGNOSIS — Z23 Encounter for immunization: Secondary | ICD-10-CM

## 2017-10-26 ENCOUNTER — Other Ambulatory Visit: Payer: Self-pay | Admitting: Nurse Practitioner

## 2017-10-26 DIAGNOSIS — M47816 Spondylosis without myelopathy or radiculopathy, lumbar region: Secondary | ICD-10-CM

## 2017-10-31 ENCOUNTER — Ambulatory Visit: Payer: Medicare Other | Admitting: Family Medicine

## 2017-10-31 ENCOUNTER — Ambulatory Visit
Admission: RE | Admit: 2017-10-31 | Discharge: 2017-10-31 | Disposition: A | Discharge status: Home / Self Care | Payer: Medicare Other | Source: Ambulatory Visit | Attending: Family Medicine | Admitting: Family Medicine

## 2017-10-31 ENCOUNTER — Encounter: Payer: Self-pay | Admitting: Family Medicine

## 2017-10-31 VITALS — BP 147/76 | HR 60 | Temp 97.8°F | Resp 16 | Ht 72.0 in | Wt 235.6 lb

## 2017-10-31 DIAGNOSIS — Z23 Encounter for immunization: Secondary | ICD-10-CM | POA: Diagnosis not present

## 2017-10-31 DIAGNOSIS — G8929 Other chronic pain: Secondary | ICD-10-CM

## 2017-10-31 DIAGNOSIS — M1712 Unilateral primary osteoarthritis, left knee: Secondary | ICD-10-CM | POA: Diagnosis not present

## 2017-10-31 DIAGNOSIS — M159 Polyosteoarthritis, unspecified: Secondary | ICD-10-CM

## 2017-10-31 DIAGNOSIS — M25561 Pain in right knee: Secondary | ICD-10-CM | POA: Diagnosis not present

## 2017-10-31 DIAGNOSIS — M8949 Other hypertrophic osteoarthropathy, multiple sites: Secondary | ICD-10-CM

## 2017-10-31 DIAGNOSIS — M25562 Pain in left knee: Secondary | ICD-10-CM | POA: Diagnosis present

## 2017-10-31 DIAGNOSIS — M150 Primary generalized (osteo)arthritis: Secondary | ICD-10-CM | POA: Diagnosis not present

## 2017-10-31 MED ORDER — BACLOFEN 10 MG PO TABS: 5.0000 mg | ORAL_TABLET | Freq: Three times a day (TID) | ORAL | 1 refills | Status: DC | PRN

## 2017-10-31 NOTE — Patient Instructions (Addendum)
Thank you for coming to the office today.  Pneumonia vaccine -23 final dose today  X-ray for knees, L knee primarily  May have knee sprain or flare up from Arthritis  - I recommend a Knee Sleeve to support your knee and limit motion of the knee to help it heal, avoid excess weight bearing and repetitive activities on it - OR can get brace - or ACE wrap  CONTINUE to Recommend trial of Anti-inflammatory with Meloxicam 15mg  tabs - take one with food and plenty of water daily every day (breakfast), for next 2 to 4 weeks, then you may take only as needed - DO NOT TAKE any ibuprofen, advil, aleve, motrin while you are taking this medicine - It is safe to take Tylenol Ext Str 500mg  tabs - take 1 to 2 (max dose 1000mg ) every 6 hours as needed for breakthrough pain, max 24 hour daily dose is 6 to 8 tablets or 4000mg   Start taking Baclofen (Lioresal) 10mg  (muscle relaxant) - start with half (cut) to one whole pill at night as needed for next 1-3 nights (may make you drowsy, caution with driving) see how it affects you, then if tolerated increase to one pill 2 to 3 times a day or (every 8 hours as needed)   Use RICE therapy: - R - Rest / relative rest with activity modification avoid overuse and frequent bending or pressure on bent knee - I - Ice packs (make sure you use a towel or sock / something to protect skin) - C - Compression with ACE wrap or immobilizer apply pressure and reduce swelling allowing more support - E - Elevation - if significant swelling, lift leg above heart level (toes above your nose) to help reduce swelling, most helpful at night after day of being on your feet  If not improving within 2-3 week I would recommend RETURN FOR INJECTION OR referral to orthopedics for 2nd opinion, likely   Please schedule a Follow-up Appointment to: Return in about 4 weeks (around 11/28/2017), or if symptoms worsen or fail to improve, for knee pain arthritis.  If you have any other questions or  concerns, please feel free to call the office or send a message through [place]. You may also schedule an earlier appointment if necessary.  Additionally, you may be receiving a survey about your experience at our office within a few days to 1 week by e-mail or mail. We value your feedback.  [name], DO [place]

## 2017-10-31 NOTE — Progress Notes (Signed)
Subjective:    Patient ID: Randy [name], male    DOB: [birth year], 83 y.o.   MRN: [id number]  Randy [name] is a 83 y.o. male presenting on 10/31/2017 for Knee Pain (left side onset 5 days)   HPI   Left Knee Pain Reports symptoms started about 5 days ago with acute L knee pain, affecting him with moderate to severe pain in L knee, generalized, not one particular place. Worse when he went to sit down, would be stiff at times and worse pain. - He is taking Meloxicam 15mg  daily PRN recently with some relief - He uses a cane for ambulation - Known arthritis of lumbar spine, prior x-rays. No known OA/DJD of knees in past or x-rays to confirm - Denies any fall, injury, twisting, swelling, redness.   Health Maintenance: Due for 2nd pneumonia vaccine >1 year after previous vaccine, now to receive Pneumovax-23 today  Already UTD on Flu Vaccine  Depression screen [facility] 2/9 10/31/2017 06/16/2017 11/26/2016  Decreased Interest 0 0 0  Down, Depressed, Hopeless 0 0 0  PHQ - 2 Score 0 0 0    Social History   Tobacco Use  . Smoking status: Former Smoker    Packs/day: 1.00    Years: 12.00    Pack years: 12.00    Types: Cigarettes    Last attempt to quit: 1968    Years since quitting: 51.8  . Smokeless tobacco: Former Network engineer Use Topics  . Alcohol use: No  . Drug use: No    Review of Systems Per HPI unless specifically indicated above     Objective:    BP (!) 147/76   Pulse 60   Temp 97.8 F (36.6 C) (Oral)   Resp 16   Ht 6' (1.829 m)   Wt 235 lb 9.6 oz (106.9 kg)   BMI 31.95 kg/m   Wt Readings from Last 3 Encounters:  10/31/17 235 lb 9.6 oz (106.9 kg)  06/16/17 231 lb (104.8 kg)  05/07/17 227 lb (103 kg)    Physical Exam  Constitutional: He is oriented to person, place, and time. He appears well-developed and well-nourished. No distress.  Well-appearing, comfortable, cooperative, obese  HENT:  Head: Normocephalic and atraumatic.  Mouth/Throat: Oropharynx is clear  and moist.  Eyes: Conjunctivae are normal. Right eye exhibits no discharge. Left eye exhibits no discharge.  Cardiovascular: Normal rate.  Pulmonary/Chest: Effort normal.  Musculoskeletal: He exhibits edema (mild, baseline edema).  Left Knee / Bilateral Knees Inspection: Slightly bulky appearance and symmetrical. No ecchymosis or effusion. Palpation: Non-tender. No crepitus ROM: Full active ROM bilaterally Strength: 5/5 intact knee flex/ext, ankle dorsi/plantarflex Neurovascular: distally intact sensation light touch and pulses   Neurological: He is alert and oriented to person, place, and time.  Skin: Skin is warm and dry. No rash noted. He is not diaphoretic. No erythema.  Psychiatric: He has a normal mood and affect. His behavior is normal.  Well groomed, good eye contact, normal speech and thoughts  Nursing note and vitals reviewed.  Results for orders placed or performed in visit on 06/09/17  PSA, Total with Reflex to PSA, Free  Result Value Ref Range   PSA, Total 3.2 < OR = 4.0 ng/mL  CBC with Differential/Platelet  Result Value Ref Range   WBC 5.9 3.8 - 10.8 Thousand/uL   RBC 4.41 4.20 - 5.80 Million/uL   Hemoglobin 13.4 13.2 - 17.1 g/dL   HCT 40.0 38.5 - 50.0 %   MCV 90.7 80.0 -  100.0 fL   MCH 30.4 27.0 - 33.0 pg   MCHC 33.5 32.0 - 36.0 g/dL   RDW 13.3 11.0 - 15.0 %   Platelets 201 140 - 400 Thousand/uL   MPV 10.4 7.5 - 12.5 fL   Neutro Abs 3,699 1,500 - 7,800 cells/uL   Lymphs Abs 1,552 850 - 3,900 cells/uL   WBC mixed population 490 200 - 950 cells/uL   Eosinophils Absolute 100 15 - 500 cells/uL   Basophils Absolute 59 0 - 200 cells/uL   Neutrophils Relative % 62.7 %   Total Lymphocyte 26.3 %   Monocytes Relative 8.3 %   Eosinophils Relative 1.7 %   Basophils Relative 1.0 %  Hemoglobin A1c  Result Value Ref Range   Hgb A1c MFr Bld 5.4 <5.7 % of total Hgb   Mean Plasma Glucose 108 (calc)   eAG (mmol/L) 6.0 (calc)  Lipid panel  Result Value Ref Range    Cholesterol 210 (H) <200 mg/dL   HDL 34 (L) >40 mg/dL   Triglycerides 116 <150 mg/dL   LDL Cholesterol (Calc) 153 (H) mg/dL (calc)   Total CHOL/HDL Ratio 6.2 (H) <5.0 (calc)   Non-HDL Cholesterol (Calc) 176 (H) <130 mg/dL (calc)  COMPLETE METABOLIC PANEL WITH GFR  Result Value Ref Range   Glucose, Bld 96 65 - 99 mg/dL   BUN 19 7 - 25 mg/dL   Creat 0.85 0.70 - 1.11 mg/dL   GFR, Est Non African American 82 > OR = 60 mL/min/1.9m2   GFR, Est African American 95 > OR = 60 mL/min/1.77m2   BUN/Creatinine Ratio NOT APPLICABLE 6 - 22 (calc)   Sodium 140 135 - 146 mmol/L   Potassium 4.2 3.5 - 5.3 mmol/L   Chloride 103 98 - 110 mmol/L   CO2 31 20 - 32 mmol/L   Calcium 8.9 8.6 - 10.3 mg/dL   Total Protein 6.6 6.1 - 8.1 g/dL   Albumin 4.0 3.6 - 5.1 g/dL   Globulin 2.6 1.9 - 3.7 g/dL (calc)   AG Ratio 1.5 1.0 - 2.5 (calc)   Total Bilirubin 0.8 0.2 - 1.2 mg/dL   Alkaline phosphatase (APISO) 58 40 - 115 U/L   AST 10 10 - 35 U/L   ALT 4 (L) 9 - 46 U/L      Assessment & Plan:   Problem List Items Addressed This Visit    Knee pain, chronic - Primary   Relevant Medications   baclofen (LIORESAL) 10 MG tablet   Other Relevant Orders   DG Knee Complete 4 Views Left   DG Knee Bilateral Standing AP   DG Knee Bilateral Standing AP   Osteoarthritis of multiple joints   Relevant Medications   baclofen (LIORESAL) 10 MG tablet   Other Relevant Orders   DG Knee Complete 4 Views Left   DG Knee Bilateral Standing AP  Acute on chronic L generalized Knee pain and swelling without known injury or trauma  Known OA DJD in other joints. - Able to bear weight, no knee instability, mechanical locking - No prior history of knee surgery, arthroscopy - Inadequate conservative therapy   Plan: 1. Continue Meloxicam 15mg  daily for interval while improving Knee / Leg 2. Start Tylenol 500-1000mg  per dose TID PRN breakthrough - Start muscle relaxant with Baclofen - caution sedation - Knee X-rays done today w/  AP standing bilateral and L knee multiple views RICE therapy Future if not improving then can consider return for X-ray, steroid injection and Orthopedic secondary opinion  Follow-up       Other Visit Diagnoses    Need for 23-polyvalent pneumococcal polysaccharide vaccine       Relevant Orders   Pneumococcal polysaccharide vaccine 23-valent greater than or equal to 2yo subcutaneous/IM (Completed)      Meds ordered this encounter  Medications  . baclofen (LIORESAL) 10 MG tablet    Sig: Take 0.5-1 tablets (5-10 mg total) by mouth 3 (three) times daily as needed for muscle spasms.    Dispense:  30 each    Refill:  1    Follow up plan: Return in about 4 weeks (around 11/28/2017), or if symptoms worsen or fail to improve, for knee pain arthritis.  [name], DO [facility] 10/31/2017, 2:03 PM

## 2017-11-14 ENCOUNTER — Ambulatory Visit (INDEPENDENT_AMBULATORY_CARE_PROVIDER_SITE_OTHER): Payer: Medicare Other | Admitting: Family Medicine

## 2017-11-14 ENCOUNTER — Encounter: Payer: Self-pay | Admitting: Family Medicine

## 2017-11-14 VITALS — BP 170/78 | HR 68 | Temp 97.8°F | Resp 16 | Ht 72.0 in | Wt 237.0 lb

## 2017-11-14 DIAGNOSIS — M1712 Unilateral primary osteoarthritis, left knee: Secondary | ICD-10-CM | POA: Insufficient documentation

## 2017-11-14 NOTE — Patient Instructions (Addendum)
Thank you for coming to the office today.  Stay tuned - we will contact a [place] to determine next steps in setting up a Lift. May need a therapist to come out to the home or need the company who builds the lift to come out as well.  Continue on current medication. As needed.  If in the future pain or mobility is worse, let me know we can consider the knee steroid injection.  Please schedule a Follow-up Appointment to: Return in about 3 months (around 02/14/2018), or if symptoms worsen or fail to improve, for Knee pain / arthritis.  If you have any other questions or concerns, please feel free to call the office or send a message through [place]. You may also schedule an earlier appointment if necessary.  Additionally, you may be receiving a survey about your experience at our office within a few days to 1 week by e-mail or mail. We value your feedback.  [name], DO [place]

## 2017-11-14 NOTE — Progress Notes (Signed)
Subjective:    Patient ID: Randy [name], male    DOB: 1934-05-25, 83 y.o.   MRN: [id number]  Hillman Attig is a 83 y.o. male presenting on 11/14/2017 for Knee Pain (follow up)   HPI   FOLLOW-UP Left Knee Pain, osteoarthritis - Last visit with me 10/31/17, for most recent visit for same problem, treated with Meloxicam, Tylenol, RICE knee sleeve, and Baclofen newly added, see prior notes for background information. - Interval update with X-ray L knee showed moderate arthritis with bone spurs and tri-compartmental OA degeneration - Today patient reports overall doing better, pain is mostly controlled on current meds, able to walk and function, he still deals with stiffness and ache at times. Worse problem is going up and down steps, 5-6 at front door and also 4-6 steps in garage to get indoors. - He continues Meloxicam. Baclofen. He is not interested in Steroid injection at this time for joint, never had one but he prefers to avoid this. - He is asking about a "lift" for these steps, and if it can be approved /covered. He has not had HH PT or nursing or evaluation at home yet for this issue. - He uses a cane for ambulation - He has handicap placard for vehicle - He lives at home with his wife, and his daughter who helps provide care for them as well. - Known arthritis of lumbar spine, prior x-rays. No known OA/DJD of knees in past or x-rays to confirm - Denies any fall, injury, twisting, swelling, redness.  Health Maintenance: UTD Flu vaccine 10/07/17  Depression screen [facility] 2/9 11/14/2017 10/31/2017 06/16/2017  Decreased Interest 0 0 0  Down, Depressed, Hopeless 0 0 0  PHQ - 2 Score 0 0 0    Social History   Tobacco Use  . Smoking status: Former Smoker    Packs/day: 1.00    Years: 12.00    Pack years: 12.00    Types: Cigarettes    Last attempt to quit: 1968    Years since quitting: 51.8  . Smokeless tobacco: Former Network engineer Use Topics  . Alcohol use: No  . Drug use: No     Review of Systems Per HPI unless specifically indicated above     Objective:    BP (!) 170/78   Pulse 68   Temp 97.8 F (36.6 C) (Oral)   Resp 16   Ht 6' (1.829 m)   Wt 237 lb (107.5 kg)   BMI 32.14 kg/m   Wt Readings from Last 3 Encounters:  11/14/17 237 lb (107.5 kg)  10/31/17 235 lb 9.6 oz (106.9 kg)  06/16/17 231 lb (104.8 kg)    Physical Exam  Constitutional: He is oriented to person, place, and time. He appears well-developed and well-nourished. No distress.  Well-appearing, comfortable, cooperative, obese  HENT:  Head: Normocephalic and atraumatic.  Mouth/Throat: Oropharynx is clear and moist.  Eyes: Conjunctivae are normal. Right eye exhibits no discharge. Left eye exhibits no discharge.  Cardiovascular: Normal rate.  Pulmonary/Chest: Effort normal.  Musculoskeletal: He exhibits edema (stable baseline edema).  Left Knee / Bilateral Knees Inspection: Slightly bulky appearance and symmetrical. No ecchymosis or effusion. Palpation: Non-tender. No crepitus ROM: Full active ROM bilaterally Strength: 5/5 intact knee flex/ext, ankle dorsi/plantarflex Neurovascular: distally intact sensation light touch and pulses   Neurological: He is alert and oriented to person, place, and time.  Skin: Skin is warm and dry. No rash noted. He is not diaphoretic. No erythema.  Psychiatric: He has  a normal mood and affect. His behavior is normal.  Well groomed, good eye contact, normal speech and thoughts  Nursing note and vitals reviewed.    I have personally reviewed the radiology report from 10/31/17 Left Knee X-ray and bilateral standing AP.  EXAM: LEFT KNEE - COMPLETE 4+ VIEW; BILATERAL KNEES STANDING - 1 VIEW  COMPARISON:  None.  FINDINGS: Both knees reveal subjective adequate mineralization. The weight-bearing AP views of both knees reveal preservation of the medial and lateral joint spaces on the right. On the left however there is moderate lateral compartment  joint space loss. There is beaking of the medial tibial spine on the left. There is minimal chondrocalcinosis of the menisci. Spurs arise from the articular margins of the patella. There are enthesophytes at the insertion of the quadriceps tendon and origin of the patellar tendon. There is prominence of the tibial tuberosity. There is no joint effusion. There is no acute fracture.  IMPRESSION: Moderate osteoarthritic changes of the lateral and patellofemoral compartments of the left knee with milder changes of the medial compartment. No acute fracture nor dislocation.   Electronically Signed   By: David  Martinique M.D.   On: 11/03/2017 07:40  Results for orders placed or performed in visit on 06/09/17  PSA, Total with Reflex to PSA, Free  Result Value Ref Range   PSA, Total 3.2 < OR = 4.0 ng/mL  CBC with Differential/Platelet  Result Value Ref Range   WBC 5.9 3.8 - 10.8 Thousand/uL   RBC 4.41 4.20 - 5.80 Million/uL   Hemoglobin 13.4 13.2 - 17.1 g/dL   HCT 40.0 38.5 - 50.0 %   MCV 90.7 80.0 - 100.0 fL   MCH 30.4 27.0 - 33.0 pg   MCHC 33.5 32.0 - 36.0 g/dL   RDW 13.3 11.0 - 15.0 %   Platelets 201 140 - 400 Thousand/uL   MPV 10.4 7.5 - 12.5 fL   Neutro Abs 3,699 1,500 - 7,800 cells/uL   Lymphs Abs 1,552 850 - 3,900 cells/uL   WBC mixed population 490 200 - 950 cells/uL   Eosinophils Absolute 100 15 - 500 cells/uL   Basophils Absolute 59 0 - 200 cells/uL   Neutrophils Relative % 62.7 %   Total Lymphocyte 26.3 %   Monocytes Relative 8.3 %   Eosinophils Relative 1.7 %   Basophils Relative 1.0 %  Hemoglobin A1c  Result Value Ref Range   Hgb A1c MFr Bld 5.4 <5.7 % of total Hgb   Mean Plasma Glucose 108 (calc)   eAG (mmol/L) 6.0 (calc)  Lipid panel  Result Value Ref Range   Cholesterol 210 (H) <200 mg/dL   HDL 34 (L) >40 mg/dL   Triglycerides 116 <150 mg/dL   LDL Cholesterol (Calc) 153 (H) mg/dL (calc)   Total CHOL/HDL Ratio 6.2 (H) <5.0 (calc)   Non-HDL Cholesterol  (Calc) 176 (H) <130 mg/dL (calc)  COMPLETE METABOLIC PANEL WITH GFR  Result Value Ref Range   Glucose, Bld 96 65 - 99 mg/dL   BUN 19 7 - 25 mg/dL   Creat 0.85 0.70 - 1.11 mg/dL   GFR, Est Non African American 82 > OR = 60 mL/min/1.61m2   GFR, Est African American 95 > OR = 60 mL/min/1.44m2   BUN/Creatinine Ratio NOT APPLICABLE 6 - 22 (calc)   Sodium 140 135 - 146 mmol/L   Potassium 4.2 3.5 - 5.3 mmol/L   Chloride 103 98 - 110 mmol/L   CO2 31 20 - 32 mmol/L  Calcium 8.9 8.6 - 10.3 mg/dL   Total Protein 6.6 6.1 - 8.1 g/dL   Albumin 4.0 3.6 - 5.1 g/dL   Globulin 2.6 1.9 - 3.7 g/dL (calc)   AG Ratio 1.5 1.0 - 2.5 (calc)   Total Bilirubin 0.8 0.2 - 1.2 mg/dL   Alkaline phosphatase (APISO) 58 40 - 115 U/L   AST 10 10 - 35 U/L   ALT 4 (L) 9 - 46 U/L      Assessment & Plan:   Problem List Items Addressed This Visit    Tricompartment osteoarthritis of left knee - Primary    Persistent chronic L generalized Knee pain, with resolved swelling Pain is controlled, now without known injury or trauma  Known OA DJD in other joints - Able to bear weight, no mechanical locking - No prior history of knee surgery, arthroscopy - Inadequate conservative therapy  Imaging - X-rays L and bilateral done 10/31/17 - with moderate OA mostly tricompartmental worst Lateral and patellofemoral  Plan: 1. Discussed long term management of OA/DJD - reviewed conservative to more aggressive intervention - He is currently stable on regimen and prefers to avoid other treatments now - Offered steroid injection, he declined for now, not interested in referral or further PT / Ortho Continue Meloxicam 15mg  daily for interval while improving Knee / Leg Continue Tylenol 500-1000mg  per dose TID PRN breakthrough Continue muscle relaxant with Baclofen -  improved RICE therapy Future if not improving steroid injection and Orthopedic secondary opinion Follow-up  Additionally he inquires about a "lift" for steps at home  due to this medical problem limiting his mobility and function. I have called [place] for his plan and discussed with customer service, they stated that next step in process is for him to call the [employer] [phone] to initiate process and they will guide him further - I advised patient that he may need Home Health PT assessment of home as well, I would plan to sign off once we have more plans in place to arrange his Lift           No orders of the defined types were placed in this encounter.   Follow up plan: Return in about 3 months (around 02/14/2018), or if symptoms worsen or fail to improve, for Knee pain / arthritis.  [name], [facility] 11/14/2017, 2:30 PM

## 2017-11-14 NOTE — Assessment & Plan Note (Signed)
Persistent chronic L generalized Knee pain, with resolved swelling Pain is controlled, now without known injury or trauma  Known OA DJD in other joints - Able to bear weight, no mechanical locking - No prior history of knee surgery, arthroscopy - Inadequate conservative therapy  Imaging - X-rays L and bilateral done 10/31/17 - with moderate OA mostly tricompartmental worst Lateral and patellofemoral  Plan: 1. Discussed long term management of OA/DJD - reviewed conservative to more aggressive intervention - He is currently stable on regimen and prefers to avoid other treatments now - Offered steroid injection, he declined for now, not interested in referral or further PT / Ortho Continue Meloxicam 15mg  daily for interval while improving Knee / Leg Continue Tylenol 500-1000mg  per dose TID PRN breakthrough Continue muscle relaxant with Baclofen - improved RICE therapy Future if not improving steroid injection and Orthopedic secondary opinion Follow-up  Additionally he inquires about a "lift" for steps at home due to this medical problem limiting his mobility and function. I have called [facility] for his plan and discussed with customer service, they stated that next step in process is for him to call the [employer] Services [phone] to initiate process and they will guide him further - I advised patient that he may need Home Health PT assessment of home as well, I would plan to sign off once we have more plans in place to arrange his Lift

## 2017-11-17 ENCOUNTER — Telehealth: Payer: Self-pay | Admitting: Family Medicine

## 2017-11-17 NOTE — Telephone Encounter (Signed)
Acknowledged. I am not sure what other options he would have to solve this problem with need a lift for his steps at home.  [name], DO [facility] 11/17/2017, 12:17 PM

## 2017-11-17 NOTE — Telephone Encounter (Signed)
Pt talked to insurance about a lift for house and they will not approve and it would have to be pursued another way.  His call back [phone]

## 2017-11-22 ENCOUNTER — Other Ambulatory Visit: Payer: Self-pay | Admitting: Family Medicine

## 2017-11-22 DIAGNOSIS — E782 Mixed hyperlipidemia: Secondary | ICD-10-CM

## 2017-12-02 ENCOUNTER — Ambulatory Visit (INDEPENDENT_AMBULATORY_CARE_PROVIDER_SITE_OTHER): Payer: Medicare Other | Admitting: Vascular Surgery

## 2017-12-22 ENCOUNTER — Telehealth: Payer: Self-pay | Admitting: Family Medicine

## 2017-12-22 NOTE — Telephone Encounter (Signed)
Tried to call and schedule AWV-s; Last awv 11/26/16  No answer. Voice mailbox not set up. lec

## 2017-12-22 NOTE — Telephone Encounter (Signed)
Tried to call pt. To schedule AWV-s.  Last AWV 11/26/16.  No answer.  L/M to call Lattie Haw at [phone]. lec

## 2018-01-01 NOTE — Telephone Encounter (Signed)
On 12/22/17, Scheduled AWV-S and resheduled his OV for 02/24/18.  AWV-S at 1:30 PM and OV at 2:40 PM. lec

## 2018-01-12 ENCOUNTER — Ambulatory Visit: Payer: Medicare Other | Admitting: Family Medicine

## 2018-01-12 ENCOUNTER — Encounter: Payer: Self-pay | Admitting: Family Medicine

## 2018-01-12 VITALS — BP 177/74 | HR 63 | Temp 97.9°F | Resp 16 | Ht 72.0 in | Wt 236.7 lb

## 2018-01-12 DIAGNOSIS — M1712 Unilateral primary osteoarthritis, left knee: Secondary | ICD-10-CM | POA: Diagnosis not present

## 2018-01-12 DIAGNOSIS — M25562 Pain in left knee: Secondary | ICD-10-CM

## 2018-01-12 DIAGNOSIS — G8929 Other chronic pain: Secondary | ICD-10-CM

## 2018-01-12 MED ORDER — METHYLPREDNISOLONE ACETATE 40 MG/ML IJ SUSP
40.0000 mg | Freq: Once | INTRAMUSCULAR | Status: AC
  Administered 2018-01-12: 40 mg via INTRA_ARTICULAR

## 2018-01-12 MED ORDER — MELOXICAM 15 MG PO TABS: 15.0000 mg | ORAL_TABLET | Freq: Every day | ORAL | 2 refills | Status: DC | PRN

## 2018-01-12 MED ORDER — LIDOCAINE HCL (PF) 1 % IJ SOLN
4.0000 mL | Freq: Once | INTRAMUSCULAR | Status: AC
Start: 2018-01-12 — End: 2018-01-12
  Administered 2018-01-12: 4 mL

## 2018-01-12 NOTE — Progress Notes (Signed)
Subjective:    Patient ID: Randy [name], male    DOB: 28-Oct-1934, 84 y.o.   MRN: [id number]  Hoyte Ziebell is a 84 y.o. male presenting on 01/12/2018 for Knee Pain (getting worst)   HPI   FOLLOW-UP Left Knee Pain, osteoarthritis - Last visit with me 11/14/17, for same problem, treated with NSAID Meloxicam, Baclofen muscle relaxant, he has already had Knee X-rays, see prior notes for background information. - Interval update with persistent Left knee pain limiting his mobility and function - Today patient reports he is ready for knee injection that he has deferred / declined in the past. - He still describes persistent L knee pain, worse with aching pain at times if walk more, he admits stiffness if too sedentary, difficulty with walking up and down steps. - He continues Meloxicam. Baclofen - No prior steroid injections. He will now consider this option today. - He has not seen Orthopedics for this issue, declines at this time. He is not interested in knee replacement - He uses a cane for ambulation - He has handicap placard for vehicle - He lives at home with his wife, and his daughter who helps provide care for them as well. - Denies any fall, injury, twisting, swelling, redness.  Depression screen [facility] 2/9 01/12/2018 11/14/2017 10/31/2017  Decreased Interest 0 0 0  Down, Depressed, Hopeless 0 0 0  PHQ - 2 Score 0 0 0    Social History   Tobacco Use  . Smoking status: Former Smoker    Packs/day: 1.00    Years: 12.00    Pack years: 12.00    Types: Cigarettes    Last attempt to quit: 1968    Years since quitting: 52.0  . Smokeless tobacco: Former Network engineer Use Topics  . Alcohol use: No  . Drug use: No    Review of Systems Per HPI unless specifically indicated above     Objective:    BP (!) 177/74   Pulse 63   Temp 97.9 F (36.6 C) (Oral)   Resp 16   Ht 6' (1.829 m)   Wt 236 lb 11.2 oz (107.4 kg)   BMI 32.10 kg/m   Wt Readings from Last 3 Encounters:  01/12/18  236 lb 11.2 oz (107.4 kg)  11/14/17 237 lb (107.5 kg)  10/31/17 235 lb 9.6 oz (106.9 kg)    Physical Exam Vitals signs and nursing note reviewed.  Constitutional:      General: He is not in acute distress.    Appearance: He is well-developed. He is not diaphoretic.     Comments: Well-appearing, comfortable, cooperative, obese. Using cane.  HENT:     Head: Normocephalic and atraumatic.  Eyes:     General:        Right eye: No discharge.        Left eye: No discharge.     Conjunctiva/sclera: Conjunctivae normal.  Cardiovascular:     Rate and Rhythm: Normal rate.  Pulmonary:     Effort: Pulmonary effort is normal.  Musculoskeletal:     Comments: Left Knee / Bilateral Knees Inspection: Mild tender to palpation left knee bilateral joint line. Slightly bulky appearance and symmetrical. No ecchymosis or effusion. Palpation: Non-tender. Fine crepitus ROM: Full active ROM bilaterally Strength: 5/5 intact knee flex/ext, ankle dorsi/plantarflex Neurovascular: distally intact sensation light touch and pulses   Skin:    General: Skin is warm and dry.     Findings: No erythema or rash.  Neurological:  Mental Status: He is alert and oriented to person, place, and time.  Psychiatric:        Behavior: Behavior normal.     Comments: Well groomed, good eye contact, normal speech and thoughts    ________________________________________________________ PROCEDURE NOTE Date: 01/12/18 Left Knee steroid injection Discussed benefits and risks (including pain, bleeding, infection, steroid flare). Verbal consent given by patient. Medication:  1 cc Depo-medrol 40mg  and 4 cc Lidocaine 1% without epi Time Out taken  Landmarks identified. Area cleansed with alcohol wipes. Using 21 gauge and 1, 1/2 inch needle, Left knee joint space was injected (with above listed medication) via lateral approach cold spray used for superficial anesthetic. Sterile bandage placed. Patient tolerated procedure well  without bleeding or paresthesias. No complications.   Results for orders placed or performed in visit on 06/09/17  PSA, Total with Reflex to PSA, Free  Result Value Ref Range   PSA, Total 3.2 < OR = 4.0 ng/mL  CBC with Differential/Platelet  Result Value Ref Range   WBC 5.9 3.8 - 10.8 Thousand/uL   RBC 4.41 4.20 - 5.80 Million/uL   Hemoglobin 13.4 13.2 - 17.1 g/dL   HCT 40.0 38.5 - 50.0 %   MCV 90.7 80.0 - 100.0 fL   MCH 30.4 27.0 - 33.0 pg   MCHC 33.5 32.0 - 36.0 g/dL   RDW 13.3 11.0 - 15.0 %   Platelets 201 140 - 400 Thousand/uL   MPV 10.4 7.5 - 12.5 fL   Neutro Abs 3,699 1,500 - 7,800 cells/uL   Lymphs Abs 1,552 850 - 3,900 cells/uL   WBC mixed population 490 200 - 950 cells/uL   Eosinophils Absolute 100 15 - 500 cells/uL   Basophils Absolute 59 0 - 200 cells/uL   Neutrophils Relative % 62.7 %   Total Lymphocyte 26.3 %   Monocytes Relative 8.3 %   Eosinophils Relative 1.7 %   Basophils Relative 1.0 %  Hemoglobin A1c  Result Value Ref Range   Hgb A1c MFr Bld 5.4 <5.7 % of total Hgb   Mean Plasma Glucose 108 (calc)   eAG (mmol/L) 6.0 (calc)  Lipid panel  Result Value Ref Range   Cholesterol 210 (H) <200 mg/dL   HDL 34 (L) >40 mg/dL   Triglycerides 116 <150 mg/dL   LDL Cholesterol (Calc) 153 (H) mg/dL (calc)   Total CHOL/HDL Ratio 6.2 (H) <5.0 (calc)   Non-HDL Cholesterol (Calc) 176 (H) <130 mg/dL (calc)  COMPLETE METABOLIC PANEL WITH GFR  Result Value Ref Range   Glucose, Bld 96 65 - 99 mg/dL   BUN 19 7 - 25 mg/dL   Creat 0.85 0.70 - 1.11 mg/dL   GFR, Est Non African American 82 > OR = 60 mL/min/1.59m2   GFR, Est African American 95 > OR = 60 mL/min/1.93m2   BUN/Creatinine Ratio NOT APPLICABLE 6 - 22 (calc)   Sodium 140 135 - 146 mmol/L   Potassium 4.2 3.5 - 5.3 mmol/L   Chloride 103 98 - 110 mmol/L   CO2 31 20 - 32 mmol/L   Calcium 8.9 8.6 - 10.3 mg/dL   Total Protein 6.6 6.1 - 8.1 g/dL   Albumin 4.0 3.6 - 5.1 g/dL   Globulin 2.6 1.9 - 3.7 g/dL (calc)   AG  Ratio 1.5 1.0 - 2.5 (calc)   Total Bilirubin 0.8 0.2 - 1.2 mg/dL   Alkaline phosphatase (APISO) 58 40 - 115 U/L   AST 10 10 - 35 U/L   ALT 4 (L) 9 -  46 U/L      Assessment & Plan:   Problem List Items Addressed This Visit    Knee pain, chronic - Primary   Relevant Medications   meloxicam (MOBIC) 15 MG tablet   lidocaine (PF) (XYLOCAINE) 1 % injection 4 mL (Completed)   methylPREDNISolone acetate (DEPO-MEDROL) injection 40 mg (Completed)   Tricompartment osteoarthritis of left knee    Persistent chronic L generalized Knee pain Without known injury or trauma  Known OA DJD in other joints - Able to bear weight, no mechanical locking - No prior history of knee surgery, arthroscopy Imaging - X-rays L and bilateral done 10/31/17 - with moderate OA mostly tricompartmental worst Lateral and patellofemoral  Plan: 1. Discussed long term management of OA/DJD again - Today LEFT Knee steroid injection - see procedure note, tolerated well, some improved pain after ambulation today leaving office. - Refilled Meloxicam 15mg  daily for interval while improving Knee / Leg Continue Tylenol 500-1000mg  per dose TID PRN breakthrough Continue muscle relaxant with Baclofen - improved RICE therapy Future if not improving will refer Orthopedic secondary opinion - consider repeat injections in future effective Follow-up 4 weeks as scheduled      Relevant Medications   meloxicam (MOBIC) 15 MG tablet   lidocaine (PF) (XYLOCAINE) 1 % injection 4 mL (Completed)   methylPREDNISolone acetate (DEPO-MEDROL) injection 40 mg (Completed)        Meds ordered this encounter  Medications  . meloxicam (MOBIC) 15 MG tablet    Sig: Take 1 tablet (15 mg total) by mouth daily as needed (moderate back pain).    Dispense:  30 tablet    Refill:  2  . lidocaine (PF) (XYLOCAINE) 1 % injection 4 mL  . methylPREDNISolone acetate (DEPO-MEDROL) injection 40 mg    Follow up plan: Return in about 6 weeks (around  02/24/2018) for keep scheduled apt.  [name], DO [facility] 01/12/2018, 11:39 AM

## 2018-01-12 NOTE — Assessment & Plan Note (Signed)
Persistent chronic L generalized Knee pain Without known injury or trauma  Known OA DJD in other joints - Able to bear weight, no mechanical locking - No prior history of knee surgery, arthroscopy Imaging - X-rays L and bilateral done 10/31/17 - with moderate OA mostly tricompartmental worst Lateral and patellofemoral  Plan: 1. Discussed long term management of OA/DJD again - Today LEFT Knee steroid injection - see procedure note, tolerated well, some improved pain after ambulation today leaving office. - Refilled Meloxicam 15mg  daily for interval while improving Knee / Leg Continue Tylenol 500-1000mg  per dose TID PRN breakthrough Continue muscle relaxant with Baclofen - improved RICE therapy Future if not improving will refer Orthopedic secondary opinion - consider repeat injections in future effective Follow-up 4 weeks as scheduled

## 2018-01-12 NOTE — Patient Instructions (Addendum)
Thank you for coming to the office today.  You received a Left Knee Joint steroid injection today. - Lidocaine numbing medicine may ease the pain initially for a few hours until it wears off - As discussed, you may experience a "steroid flare" this evening or within 24-48 hours, anytime medicine is injected into an inflamed joint it can cause the pain to get worse temporarily - Everyone responds differently to these injections, it depends on the patient and the severity of the joint problem, it may provide anywhere from days to weeks, to months of relief. Ideal response is >6 months relief - Try to take it easy for next 1-2 days, avoid over activity and strain on joint (limit walking for knee) - Recommend the following:   - For swelling - rest, compression sleeve / ACE wrap, elevation, and ice packs as needed for first few days   - For pain in future may use heating pad or moist heat as needed  Refilled Meloxicam.  Please schedule a Follow-up Appointment to: Return in about 6 weeks (around 02/24/2018) for keep scheduled apt.  If you have any other questions or concerns, please feel free to call the office or send a message through [place]. You may also schedule an earlier appointment if necessary.  Additionally, you may be receiving a survey about your experience at our office within a few days to 1 week by e-mail or mail. We value your feedback.  [name], DO [place]

## 2018-02-02 ENCOUNTER — Ambulatory Visit (INDEPENDENT_AMBULATORY_CARE_PROVIDER_SITE_OTHER): Payer: Medicare Other | Admitting: Family Medicine

## 2018-02-02 ENCOUNTER — Encounter: Payer: Self-pay | Admitting: Family Medicine

## 2018-02-02 VITALS — BP 155/82 | HR 68 | Temp 97.6°F | Resp 16 | Ht 72.0 in | Wt 237.0 lb

## 2018-02-02 DIAGNOSIS — Z9889 Other specified postprocedural states: Secondary | ICD-10-CM | POA: Diagnosis not present

## 2018-02-02 DIAGNOSIS — M1712 Unilateral primary osteoarthritis, left knee: Secondary | ICD-10-CM

## 2018-02-02 DIAGNOSIS — M79622 Pain in left upper arm: Secondary | ICD-10-CM

## 2018-02-02 MED ORDER — GABAPENTIN 100 MG PO CAPS: ORAL_CAPSULE | ORAL | 0 refills | Status: DC

## 2018-02-02 NOTE — Progress Notes (Signed)
Subjective:    Patient ID: Mattew Chriswell, male    DOB: 1934/05/21, 84 y.o.   MRN: [id number]  Nickie Deren is a 84 y.o. male presenting on 02/02/2018 for Arm Pain (left arm onset few months getting worst had surgery done in past )   HPI   LEFT ARM PAIN, Post surgical site - Prior history of Left upper extremity bicep / tricep ganglion cyst, s/p surgical excisional removal in approx 2015 or 2016 in [place], by General Surgery, do not have this record. He did well initially post op, and has not bothered him much since, he does not get BP in left arm. - He describes no significant pain at rest. But if reaching out in abduction may have some sharp pain, temporarily. Seems to be happening more often now, concerning him getting worse - He takes Meloxicam 15mg  daily PRN for arthritis, and it has provided some relief for this symptom in past - Tried Biofreeze topically over area on muscle, sometimes it may provide temporary relief - Tried Baclofen in past and failed due to sedation and groggy side effect - Denies any numbness or tingling radiating in arm  Follow-up Left Knee Pain / OA DJD Last visit 01/12/18, see note for steroid injection knee joint Today he reports significantly improved with good results, now his pain is mostly resolved.   Depression screen [facility] 2/9 02/02/2018 01/12/2018 11/14/2017  Decreased Interest 0 0 0  Down, Depressed, Hopeless 0 0 0  PHQ - 2 Score 0 0 0    Social History   Tobacco Use  . Smoking status: Former Smoker    Packs/day: 1.00    Years: 12.00    Pack years: 12.00    Types: Cigarettes    Last attempt to quit: 1968    Years since quitting: 52.1  . Smokeless tobacco: Former Network engineer Use Topics  . Alcohol use: No  . Drug use: No    Review of Systems Per HPI unless specifically indicated above     Objective:    BP (!) 155/82   Pulse 68   Temp 97.6 F (36.4 C) (Oral)   Resp 16   Ht 6' (1.829 m)   Wt 237 lb (107.5 kg)   BMI 32.14 kg/m     Wt Readings from Last 3 Encounters:  02/02/18 237 lb (107.5 kg)  01/12/18 236 lb 11.2 oz (107.4 kg)  11/14/17 237 lb (107.5 kg)    Physical Exam Vitals signs and nursing note reviewed.  Constitutional:      General: He is not in acute distress.    Appearance: He is well-developed. He is not diaphoretic.     Comments: Well-appearing, comfortable, cooperative, obese. Today not using cane.  HENT:     Head: Normocephalic and atraumatic.  Eyes:     General:        Right eye: No discharge.        Left eye: No discharge.     Conjunctiva/sclera: Conjunctivae normal.  Cardiovascular:     Rate and Rhythm: Normal rate.  Pulmonary:     Effort: Pulmonary effort is normal.  Musculoskeletal:     Comments: Left Upper Extremity Visible post surgical scar linear 4-5 cm linear horizontal across through muscle bicep/tricep region lateral aspect - Palpable deformity of muscle belly, without localized nodular density or swelling, no erythema, no significant reproduced tenderness on palpation - Sensation intact to light touch - Left grip normal strength - Left UE ROM flexion shoulder intact.  Slightly limited L upper shoulder abduction due to sharp pain reproduced  Left Knee Improved, non tender. Improved ROM flexion, ext. Gait normal without difficulty  Skin:    General: Skin is warm and dry.     Findings: No erythema or rash.  Neurological:     Mental Status: He is alert and oriented to person, place, and time.  Psychiatric:        Behavior: Behavior normal.     Comments: Well groomed, good eye contact, normal speech and thoughts        Assessment & Plan:   Problem List Items Addressed This Visit    Tricompartment osteoarthritis of left knee Significant improvement s/p prior knee steroid injection >3 weeks ago Follow-up as planned, may repeat injection in future if indicated     Other Visit Diagnoses    Left upper arm pain    -  Primary   Relevant Medications   gabapentin  (NEURONTIN) 100 MG capsule   S/P excision of ganglion cyst       Relevant Medications   gabapentin (NEURONTIN) 100 MG capsule      Uncertain exact etiology of localized pain in LUE s/p prior ganglion cyst removal, possible neuropathic or nerve injury suspected post op complication. Uncertain why pain now years later, no localized significant changes evident on exam. No extending symptoms, motor symptoms intact in left upper extremity and sensation intact  Plan Trial of Gabapentin, titrate dose, caution sedation DC Baclofen May continue NSAID oral. In future may consider add topical NSAID for localized relief may beneficial ORDER MRI Left upper extremity, as advised per Radiology for imaging of this area Follow-up results, may warrant refer to Neuro vs Orthopedic if indicated  Meds ordered this encounter  Medications  . gabapentin (NEURONTIN) 100 MG capsule    Sig: Start 1 capsule daily, increase by 1 cap every 2-3 days as tolerated up to 3 times a day, or may take 3 at once in evening.    Dispense:  90 capsule    Refill:  0   Orders Placed This Encounter  Procedures  . MR HUMERUS LEFT WO CONTRAST    Area of post surgical scar on arm, w/ muscle deformity, upper arm, soft tissue primarily    Standing Status:   Future    Standing Expiration Date:   04/04/2019    Order Specific Question:   ** REASON FOR EXAM (FREE TEXT)    Answer:   left upper extremity triceps region with prior surgical removal ganglion cyst with new onset post op pain and muscle deformity    Order Specific Question:   What is the patient's sedation requirement?    Answer:   No Sedation    Order Specific Question:   Does the patient have a pacemaker or implanted devices?    Answer:   No    Order Specific Question:   Preferred imaging location?    Answer:   [facility] (table limit-400lbs)    Order Specific Question:   Call Results- Best Contact Number?    Answer:   [phone]    Order Specific Question:    Radiology Contrast Protocol - do NOT remove file path    Answer:   [email or web address]    Follow up plan: Return if symptoms worsen or fail to improve, for keep apt in Feb .  Will contact Radiology to assist in determining appropriate imaging for upper extremity and consider referral, will notify patient in the interval if order or refer  him.  [name], DO [facility] 02/02/2018, 2:45 PM

## 2018-02-02 NOTE — Patient Instructions (Addendum)
Thank you for coming to the office today.  Most likely nerve pain from ganglion cyst removal in 2013  Start Gabapentin 100mg  capsules, take at night for 2-3 nights only, and then increase to 2 times a day for a few days, and then may increase to 3 times a day, it may make you drowsy, if helps significantly at night only, then you can increase instead to 3 capsules at night, instead of 3 times a day - In the future if needed, we can significantly increase the dose if tolerated well, some common doses are 300mg  three times a day up to 600mg  three times a day, usually it takes several weeks or months to get to higher doses  STOP Baclofen  Continue Meloxicam as needed for anti inflammatory  Call if not improved on Gabapentin sooner - we can send in a new Topical Medicine that is anti inflammatory or discuss at next visit.  We may arrange for a consultation with specialist or CT imaging of arm to evaluate, stay tuned for updates  Please schedule a Follow-up Appointment to: Return if symptoms worsen or fail to improve, for keep apt in Feb .  If you have any other questions or concerns, please feel free to call the office or send a message through [place]. You may also schedule an earlier appointment if necessary.  Additionally, you may be receiving a survey about your experience at our office within a few days to 1 week by e-mail or mail. We value your feedback.  [name], DO [place]

## 2018-02-11 ENCOUNTER — Other Ambulatory Visit: Payer: Self-pay | Admitting: Family Medicine

## 2018-02-11 ENCOUNTER — Ambulatory Visit
Admission: RE | Admit: 2018-02-11 | Discharge: 2018-02-11 | Disposition: A | Discharge status: Home / Self Care | Payer: Medicare Other | Source: Ambulatory Visit | Attending: Family Medicine | Admitting: Family Medicine

## 2018-02-11 DIAGNOSIS — M79622 Pain in left upper arm: Secondary | ICD-10-CM | POA: Insufficient documentation

## 2018-02-11 DIAGNOSIS — Z9889 Other specified postprocedural states: Secondary | ICD-10-CM | POA: Insufficient documentation

## 2018-02-24 ENCOUNTER — Ambulatory Visit (INDEPENDENT_AMBULATORY_CARE_PROVIDER_SITE_OTHER): Payer: Medicare Other

## 2018-02-24 ENCOUNTER — Other Ambulatory Visit: Payer: Self-pay

## 2018-02-24 ENCOUNTER — Ambulatory Visit (INDEPENDENT_AMBULATORY_CARE_PROVIDER_SITE_OTHER): Payer: Medicare Other | Admitting: Family Medicine

## 2018-02-24 ENCOUNTER — Encounter: Payer: Self-pay | Admitting: Family Medicine

## 2018-02-24 ENCOUNTER — Other Ambulatory Visit: Payer: Self-pay | Admitting: Family Medicine

## 2018-02-24 VITALS — BP 130/82 | HR 64 | Temp 97.9°F | Resp 16 | Ht 72.0 in | Wt 237.6 lb

## 2018-02-24 VITALS — BP 130/82 | HR 64 | Temp 97.9°F | Resp 15 | Ht 72.0 in | Wt 237.6 lb

## 2018-02-24 DIAGNOSIS — I872 Venous insufficiency (chronic) (peripheral): Secondary | ICD-10-CM

## 2018-02-24 DIAGNOSIS — M25562 Pain in left knee: Secondary | ICD-10-CM

## 2018-02-24 DIAGNOSIS — M8949 Other hypertrophic osteoarthropathy, multiple sites: Secondary | ICD-10-CM

## 2018-02-24 DIAGNOSIS — M159 Polyosteoarthritis, unspecified: Secondary | ICD-10-CM

## 2018-02-24 DIAGNOSIS — M25561 Pain in right knee: Secondary | ICD-10-CM | POA: Diagnosis not present

## 2018-02-24 DIAGNOSIS — Z0000 Encounter for general adult medical examination without abnormal findings: Secondary | ICD-10-CM

## 2018-02-24 DIAGNOSIS — N400 Enlarged prostate without lower urinary tract symptoms: Secondary | ICD-10-CM

## 2018-02-24 DIAGNOSIS — I10 Essential (primary) hypertension: Secondary | ICD-10-CM

## 2018-02-24 DIAGNOSIS — M1711 Unilateral primary osteoarthritis, right knee: Secondary | ICD-10-CM

## 2018-02-24 DIAGNOSIS — T466X5A Adverse effect of antihyperlipidemic and antiarteriosclerotic drugs, initial encounter: Secondary | ICD-10-CM

## 2018-02-24 DIAGNOSIS — M150 Primary generalized (osteo)arthritis: Secondary | ICD-10-CM | POA: Diagnosis not present

## 2018-02-24 DIAGNOSIS — G8929 Other chronic pain: Secondary | ICD-10-CM | POA: Diagnosis not present

## 2018-02-24 DIAGNOSIS — E782 Mixed hyperlipidemia: Secondary | ICD-10-CM

## 2018-02-24 DIAGNOSIS — M791 Myalgia, unspecified site: Secondary | ICD-10-CM | POA: Insufficient documentation

## 2018-02-24 DIAGNOSIS — K219 Gastro-esophageal reflux disease without esophagitis: Secondary | ICD-10-CM

## 2018-02-24 DIAGNOSIS — R7309 Other abnormal glucose: Secondary | ICD-10-CM

## 2018-02-24 MED ORDER — LIDOCAINE HCL (PF) 1 % IJ SOLN
4.0000 mL | Freq: Once | INTRAMUSCULAR | Status: AC
  Administered 2018-02-24: 4 mL

## 2018-02-24 MED ORDER — METHYLPREDNISOLONE ACETATE 40 MG/ML IJ SUSP
40.0000 mg | Freq: Once | INTRAMUSCULAR | Status: AC
  Administered 2018-02-24: 40 mg via INTRA_ARTICULAR

## 2018-02-24 NOTE — Progress Notes (Signed)
Subjective:    Patient ID: Randy [name], male    DOB: August 09, 1934, 84 y.o.   MRN: [id number]  Randy [name] is a 84 y.o. male presenting on 02/24/2018 for Knee Pain (obtw patient fell on Sunday night and has pain on his back thigh )  Already seen by Tyler Aas LPN today for AMW.  HPI   FOLLOW-UP RIGHT Knee Pain, osteoarthritis - Last visit with me 01/12/2018, for same problem, treated with LEFT knee steroid injection, continued meloxicam NSAID, previously had x-rays. see prior notes for background information. - Interval update with dramatic improvement in Left knee pain after injection, he has better mobility and function now  - Today patient reports he is ready for RIGHT other knee injection - He still describes persistent R knee pain, worse with aching pain at times if walk more, he admits stiffness if too sedentary, difficulty with walking up and down steps. - He continues Meloxicam. He has discontinued baclofen and gabapentin - He has not seen Orthopedics for this issue, declines at this time. He is not interested in knee replacement - He uses a cane for ambulation at times, less often now. - He has handicap placard for vehicle - He lives at home with his wife, and his daughter who helps provide care for them as well. Admits recent fall and some pain to back of thigh now doing better - Denies injury, twisting, swelling, redness.  Additional updates - He has self discontinued Rosuvastatin, due to statin induced myalgias multiple muscles, he tried 3 times week could not tolerate low dose - He discontinued both Gabapentin and Baclofen due to causing some sharp pains, did not feel that they were helping.  Depression screen [facility] 2/9 02/02/2018 01/12/2018 11/14/2017  Decreased Interest 0 0 0  Down, Depressed, Hopeless 0 0 0  PHQ - 2 Score 0 0 0    Social History   Tobacco Use  . Smoking status: Former Smoker    Packs/day: 1.00    Years: 12.00    Pack years: 12.00    Types: Cigarettes    Last attempt to quit: 1968    Years since quitting: 52.1  . Smokeless tobacco: Former Network engineer Use Topics  . Alcohol use: No  . Drug use: No    Review of Systems Per HPI unless specifically indicated above     Objective:    BP 130/82   Pulse 64   Temp 97.9 F (36.6 C) (Oral)   Resp 16   Ht 6' (1.829 m)   Wt 237 lb 9.6 oz (107.8 kg)   BMI 32.22 kg/m   Wt Readings from Last 3 Encounters:  02/24/18 237 lb 9.6 oz (107.8 kg)  02/24/18 237 lb 9.6 oz (107.8 kg)  02/02/18 237 lb (107.5 kg)    Physical Exam Vitals signs and nursing note reviewed.  Constitutional:      General: He is not in acute distress.    Appearance: He is well-developed. He is not diaphoretic.     Comments: Well-appearing, comfortable, cooperative, obese  HENT:     Head: Normocephalic and atraumatic.  Eyes:     General:        Right eye: No discharge.        Left eye: No discharge.     Conjunctiva/sclera: Conjunctivae normal.  Cardiovascular:     Rate and Rhythm: Normal rate.  Pulmonary:     Effort: Pulmonary effort is normal.  Musculoskeletal:     Comments: RIGHT Knee /  Bilateral Knees Inspection / palpation: Mild tender to palpation R knee medial joint line. Slightly bulky appearance and symmetrical. No ecchymosis or effusion. Fine crepitus ROM: Full active ROM bilaterally Strength: 5/5 intact knee flex/ext, ankle dorsi/plantarflex Neurovascular: distally intact sensation light touch and pulses  Not using cane for ambulation  L knee is significantly improved, reduced pain and improved ROM.  Skin:    General: Skin is warm and dry.     Findings: No erythema or rash.  Neurological:     Mental Status: He is alert and oriented to person, place, and time.  Psychiatric:        Behavior: Behavior normal.     Comments: Well groomed, good eye contact, normal speech and thoughts      ________________________________________________________ PROCEDURE NOTE Date: 02/24/18 Right knee steroid  injection Discussed benefits and risks (including pain, bleeding, infection, steroid flare). Verbal consent given by patient. Medication:  1 cc Depo-medrol 40mg  and 4 cc Lidocaine 1% without epi Time Out taken  Landmarks identified. Area cleansed with alcohol wipes. Using 21 gauge and 1, 1/2 inch needle, Right knee  Joint  space was injected (with above listed medication) via medial approach cold spray used for superficial anesthetic. Sterile bandage placed. Patient tolerated procedure well without bleeding or paresthesias. No complications.   Results for orders placed or performed in visit on 06/09/17  PSA, Total with Reflex to PSA, Free  Result Value Ref Range   PSA, Total 3.2 < OR = 4.0 ng/mL  CBC with Differential/Platelet  Result Value Ref Range   WBC 5.9 3.8 - 10.8 Thousand/uL   RBC 4.41 4.20 - 5.80 Million/uL   Hemoglobin 13.4 13.2 - 17.1 g/dL   HCT 40.0 38.5 - 50.0 %   MCV 90.7 80.0 - 100.0 fL   MCH 30.4 27.0 - 33.0 pg   MCHC 33.5 32.0 - 36.0 g/dL   RDW 13.3 11.0 - 15.0 %   Platelets 201 140 - 400 Thousand/uL   MPV 10.4 7.5 - 12.5 fL   Neutro Abs 3,699 1,500 - 7,800 cells/uL   Lymphs Abs 1,552 850 - 3,900 cells/uL   WBC mixed population 490 200 - 950 cells/uL   Eosinophils Absolute 100 15 - 500 cells/uL   Basophils Absolute 59 0 - 200 cells/uL   Neutrophils Relative % 62.7 %   Total Lymphocyte 26.3 %   Monocytes Relative 8.3 %   Eosinophils Relative 1.7 %   Basophils Relative 1.0 %  Hemoglobin A1c  Result Value Ref Range   Hgb A1c MFr Bld 5.4 <5.7 % of total Hgb   Mean Plasma Glucose 108 (calc)   eAG (mmol/L) 6.0 (calc)  Lipid panel  Result Value Ref Range   Cholesterol 210 (H) <200 mg/dL   HDL 34 (L) >40 mg/dL   Triglycerides 116 <150 mg/dL   LDL Cholesterol (Calc) 153 (H) mg/dL (calc)   Total CHOL/HDL Ratio 6.2 (H) <5.0 (calc)   Non-HDL Cholesterol (Calc) 176 (H) <130 mg/dL (calc)  COMPLETE METABOLIC PANEL WITH GFR  Result Value Ref Range   Glucose, Bld 96  65 - 99 mg/dL   BUN 19 7 - 25 mg/dL   Creat 0.85 0.70 - 1.11 mg/dL   GFR, Est Non African American 82 > OR = 60 mL/min/1.109m2   GFR, Est African American 95 > OR = 60 mL/min/1.39m2   BUN/Creatinine Ratio NOT APPLICABLE 6 - 22 (calc)   Sodium 140 135 - 146 mmol/L   Potassium 4.2 3.5 - 5.3 mmol/L  Chloride 103 98 - 110 mmol/L   CO2 31 20 - 32 mmol/L   Calcium 8.9 8.6 - 10.3 mg/dL   Total Protein 6.6 6.1 - 8.1 g/dL   Albumin 4.0 3.6 - 5.1 g/dL   Globulin 2.6 1.9 - 3.7 g/dL (calc)   AG Ratio 1.5 1.0 - 2.5 (calc)   Total Bilirubin 0.8 0.2 - 1.2 mg/dL   Alkaline phosphatase (APISO) 58 40 - 115 U/L   AST 10 10 - 35 U/L   ALT 4 (L) 9 - 46 U/L      Assessment & Plan:   Problem List Items Addressed This Visit    Knee pain, chronic   Relevant Medications   lidocaine (PF) (XYLOCAINE) 1 % injection 4 mL (Completed)   methylPREDNISolone acetate (DEPO-MEDROL) injection 40 mg (Completed)   Myalgia due to statin   Osteoarthritis of multiple joints   Relevant Medications   methylPREDNISolone acetate (DEPO-MEDROL) injection 40 mg (Completed)    Other Visit Diagnoses    Primary osteoarthritis of right knee    -  Primary   Relevant Medications   lidocaine (PF) (XYLOCAINE) 1 % injection 4 mL (Completed)   methylPREDNISolone acetate (DEPO-MEDROL) injection 40 mg (Completed)      Persistent chronic R medial Knee pain Significantly IMPROVED LEFT knee pain s/p injection 6 wk ago Without known injury or trauma Known OA DJD in other joints - Able to bear weight, no mechanical locking - No prior history of knee surgery, arthroscopy Imaging - X-rays L and bilateral done 10/31/17 - with moderate OA mostly tricompartmental worst Lateral and patellofemoral  Plan: Today RIGHT Knee steroid injection - see procedure note, tolerated well - ambulating w/o cane. - Continue  Meloxicam 15mg  daily for interval while improving Knee / Leg Continue Tylenol 500-1000mg  per dose TID PRN  breakthrough DISCONTINUED Gabapentin and baclofen - patient has stopped taking RICE therapy Future if not improving will refer Orthopedic secondary opinion - consider repeat injections in future effective q 3-6 months  Meds ordered this encounter  Medications  . lidocaine (PF) (XYLOCAINE) 1 % injection 4 mL  . methylPREDNISolone acetate (DEPO-MEDROL) injection 40 mg     Follow up plan: Return in about 4 months (around 06/25/2018) for Annual Physical.  Future labs ordered for 06/10/2018  [name], [facility] 02/24/2018, 3:14 PM

## 2018-02-24 NOTE — Patient Instructions (Signed)
Randy [name] , Thank you for taking time to come for your Medicare Wellness Visit. I appreciate your ongoing commitment to your health goals. Please review the following plan we discussed and let me know if I can assist you in the future.   Screening recommendations/referrals: Colonoscopy: no longer required  Recommended yearly ophthalmology/optometry visit for glaucoma screening and checkup Recommended yearly dental visit for hygiene and checkup  Vaccinations: Influenza vaccine: up to date  Pneumococcal vaccine: up to date Tdap vaccine: up to date  Shingles vaccine: shingrix eligible, check with your insurance company for coverage   Advanced directives: Please bring a copy of your health care power of attorney and living will to the office at your convenience.  Conditions/risks identified: discussed fall prevention  Next appointment: Follow up in one year for your annual wellness exam.   Preventive Care 65 Years and Older, Male Preventive care refers to lifestyle choices and visits with your health care provider that can promote health and wellness. What does preventive care include?  A yearly physical exam. This is also called an annual well check.  Dental exams once or twice a year.  Routine eye exams. Ask your health care provider how often you should have your eyes checked.  Personal lifestyle choices, including:  Daily care of your teeth and gums.  Regular physical activity.  Eating a healthy diet.  Avoiding tobacco and drug use.  Limiting alcohol use.  Practicing safe sex.  Taking low doses of aspirin every day.  Taking vitamin and mineral supplements as recommended by your health care provider. What happens during an annual well check? The services and screenings done by your health care provider during your annual well check will depend on your age, overall health, lifestyle risk factors, and family history of disease. Counseling  Your health care provider may  ask you questions about your:  Alcohol use.  Tobacco use.  Drug use.  Emotional well-being.  Home and relationship well-being.  Sexual activity.  Eating habits.  History of falls.  Memory and ability to understand (cognition).  Work and work Statistician. Screening  You may have the following tests or measurements:  Height, weight, and BMI.  Blood pressure.  Lipid and cholesterol levels. These may be checked every 5 years, or more frequently if you are over 66 years old.  Skin check.  Lung cancer screening. You may have this screening every year starting at age 38 if you have a 30-pack-year history of smoking and currently smoke or have quit within the past 15 years.  Fecal occult blood test (FOBT) of the stool. You may have this test every year starting at age 40.  Flexible sigmoidoscopy or colonoscopy. You may have a sigmoidoscopy every 5 years or a colonoscopy every 10 years starting at age 89.  Prostate cancer screening. Recommendations will vary depending on your family history and other risks.  Hepatitis C blood test.  Hepatitis B blood test.  Sexually transmitted disease (STD) testing.  Diabetes screening. This is done by checking your blood sugar (glucose) after you have not eaten for a while (fasting). You may have this done every 1-3 years.  Abdominal aortic aneurysm (AAA) screening. You may need this if you are a current or former smoker.  Osteoporosis. You may be screened starting at age 34 if you are at high risk. Talk with your health care provider about your test results, treatment options, and if necessary, the need for more tests. Vaccines  Your health care provider may  recommend certain vaccines, such as:  Influenza vaccine. This is recommended every year.  Tetanus, diphtheria, and acellular pertussis (Tdap, Td) vaccine. You may need a Td booster every 10 years.  Zoster vaccine. You may need this after age 13.  Pneumococcal 13-valent  conjugate (PCV13) vaccine. One dose is recommended after age 47.  Pneumococcal polysaccharide (PPSV23) vaccine. One dose is recommended after age 22. Talk to your health care provider about which screenings and vaccines you need and how often you need them. This information is not intended to replace advice given to you by your health care provider. Make sure you discuss any questions you have with your health care provider. Document Released: 01/20/2015 Document Revised: 09/13/2015 Document Reviewed: 10/25/2014 Elsevier Interactive Patient Education  2017 [place] Prevention in the Home Falls can cause injuries. They can happen to people of all ages. There are many things you can do to make your home safe and to help prevent falls. What can I do on the outside of my home?  Regularly fix the edges of walkways and driveways and fix any cracks.  Remove anything that might make you trip as you walk through a door, such as a raised step or threshold.  Trim any bushes or trees on the path to your home.  Use bright outdoor lighting.  Clear any walking paths of anything that might make someone trip, such as rocks or tools.  Regularly check to see if handrails are loose or broken. Make sure that both sides of any steps have handrails.  Any raised decks and porches should have guardrails on the edges.  Have any leaves, snow, or ice cleared regularly.  Use sand or salt on walking paths during winter.  Clean up any spills in your garage right away. This includes oil or grease spills. What can I do in the bathroom?  Use night lights.  Install grab bars by the toilet and in the tub and shower. Do not use towel bars as grab bars.  Use non-skid mats or decals in the tub or shower.  If you need to sit down in the shower, use a plastic, non-slip stool.  Keep the floor dry. Clean up any water that spills on the floor as soon as it happens.  Remove soap buildup in the tub or  shower regularly.  Attach bath mats securely with double-sided non-slip rug tape.  Do not have throw rugs and other things on the floor that can make you trip. What can I do in the bedroom?  Use night lights.  Make sure that you have a light by your bed that is easy to reach.  Do not use any sheets or blankets that are too big for your bed. They should not hang down onto the floor.  Have a firm chair that has side arms. You can use this for support while you get dressed.  Do not have throw rugs and other things on the floor that can make you trip. What can I do in the kitchen?  Clean up any spills right away.  Avoid walking on wet floors.  Keep items that you use a lot in easy-to-reach places.  If you need to reach something above you, use a strong step stool that has a grab bar.  Keep electrical cords out of the way.  Do not use floor polish or wax that makes floors slippery. If you must use wax, use non-skid floor wax.  Do not have throw rugs and  other things on the floor that can make you trip. What can I do with my stairs?  Do not leave any items on the stairs.  Make sure that there are handrails on both sides of the stairs and use them. Fix handrails that are broken or loose. Make sure that handrails are as long as the stairways.  Check any carpeting to make sure that it is firmly attached to the stairs. Fix any carpet that is loose or worn.  Avoid having throw rugs at the top or bottom of the stairs. If you do have throw rugs, attach them to the floor with carpet tape.  Make sure that you have a light switch at the top of the stairs and the bottom of the stairs. If you do not have them, ask someone to add them for you. What else can I do to help prevent falls?  Wear shoes that:  Do not have high heels.  Have rubber bottoms.  Are comfortable and fit you well.  Are closed at the toe. Do not wear sandals.  If you use a stepladder:  Make sure that it is fully  opened. Do not climb a closed stepladder.  Make sure that both sides of the stepladder are locked into place.  Ask someone to hold it for you, if possible.  Clearly mark and make sure that you can see:  Any grab bars or handrails.  First and last steps.  Where the edge of each step is.  Use tools that help you move around (mobility aids) if they are needed. These include:  Canes.  Walkers.  Scooters.  Crutches.  Turn on the lights when you go into a dark area. Replace any light bulbs as soon as they burn out.  Set up your furniture so you have a clear path. Avoid moving your furniture around.  If any of your floors are uneven, fix them.  If there are any pets around you, be aware of where they are.  Review your medicines with your doctor. Some medicines can make you feel dizzy. This can increase your chance of falling. Ask your doctor what other things that you can do to help prevent falls. This information is not intended to replace advice given to you by your health care provider. Make sure you discuss any questions you have with your health care provider. Document Released: 10/20/2008 Document Revised: 06/01/2015 Document Reviewed: 01/28/2014 Elsevier Interactive Patient Education  2017 [place] Prevention in the Home, Adult Falls can cause injuries. They can happen to people of all ages. There are many things you can do to make your home safe and to help prevent falls. Ask for help when making these changes, if needed. What actions can I take to prevent falls? General Instructions  Use good lighting in all rooms. Replace any light bulbs that burn out.  Turn on the lights when you go into a dark area. Use night-lights.  Keep items that you use often in easy-to-reach places. Lower the shelves around your home if necessary.  Set up your furniture so you have a clear path. Avoid moving your furniture around.  Do not have throw rugs and other things on  the floor that can make you trip.  Avoid walking on wet floors.  If any of your floors are uneven, fix them.  Add color or contrast paint or tape to clearly mark and help you see: ? Any grab bars or handrails. ? First and last  steps of stairways. ? Where the edge of each step is.  If you use a stepladder: ? Make sure that it is fully opened. Do not climb a closed stepladder. ? Make sure that both sides of the stepladder are locked into place. ? Ask someone to hold the stepladder for you while you use it.  If there are any pets around you, be aware of where they are. What can I do in the bathroom?      Keep the floor dry. Clean up any water that spills onto the floor as soon as it happens.  Remove soap buildup in the tub or shower regularly.  Use non-skid mats or decals on the floor of the tub or shower.  Attach bath mats securely with double-sided, non-slip rug tape.  If you need to sit down in the shower, use a plastic, non-slip stool.  Install grab bars by the toilet and in the tub and shower. Do not use towel bars as grab bars. What can I do in the bedroom?  Make sure that you have a light by your bed that is easy to reach.  Do not use any sheets or blankets that are too big for your bed. They should not hang down onto the floor.  Have a firm chair that has side arms. You can use this for support while you get dressed. What can I do in the kitchen?  Clean up any spills right away.  If you need to reach something above you, use a strong step stool that has a grab bar.  Keep electrical cords out of the way.  Do not use floor polish or wax that makes floors slippery. If you must use wax, use non-skid floor wax. What can I do with my stairs?  Do not leave any items on the stairs.  Make sure that you have a light switch at the top of the stairs and the bottom of the stairs. If you do not have them, ask someone to add them for you.  Make sure that there are  handrails on both sides of the stairs, and use them. Fix handrails that are broken or loose. Make sure that handrails are as long as the stairways.  Install non-slip stair treads on all stairs in your home.  Avoid having throw rugs at the top or bottom of the stairs. If you do have throw rugs, attach them to the floor with carpet tape.  Choose a carpet that does not hide the edge of the steps on the stairway.  Check any carpeting to make sure that it is firmly attached to the stairs. Fix any carpet that is loose or worn. What can I do on the outside of my home?  Use bright outdoor lighting.  Regularly fix the edges of walkways and driveways and fix any cracks.  Remove anything that might make you trip as you walk through a door, such as a raised step or threshold.  Trim any bushes or trees on the path to your home.  Regularly check to see if handrails are loose or broken. Make sure that both sides of any steps have handrails.  Install guardrails along the edges of any raised decks and porches.  Clear walking paths of anything that might make someone trip, such as tools or rocks.  Have any leaves, snow, or ice cleared regularly.  Use sand or salt on walking paths during winter.  Clean up any spills in your garage right away. This includes  grease or oil spills. What other actions can I take?  Wear shoes that: ? Have a low heel. Do not wear high heels. ? Have rubber bottoms. ? Are comfortable and fit you well. ? Are closed at the toe. Do not wear open-toe sandals.  Use tools that help you move around (mobility aids) if they are needed. These include: ? Canes. ? Walkers. ? Scooters. ? Crutches.  Review your medicines with your doctor. Some medicines can make you feel dizzy. This can increase your chance of falling. Ask your doctor what other things you can do to help prevent falls. Where to find more information  Centers for Disease Control and Prevention, STEADI:  https://garcia.biz/  [employer] on Aging: BrainJudge.co.uk Contact a doctor if:  You are afraid of falling at home.  You feel weak, drowsy, or dizzy at home.  You fall at home. Summary  There are many simple things that you can do to make your home safe and to help prevent falls.  Ways to make your home safe include removing tripping hazards and installing grab bars in the bathroom.  Ask for help when making these changes in your home. This information is not intended to replace advice given to you by your health care provider. Make sure you discuss any questions you have with your health care provider. Document Released: 10/20/2008 Document Revised: 08/08/2016 Document Reviewed: 08/08/2016 Elsevier Interactive Patient Education  2019 [employer].

## 2018-02-24 NOTE — Patient Instructions (Addendum)
Thank you for coming to the office today.  Updated medication list - removed Baclofen, Gabapentin, Rosuvastatin - take these to pharmacy to confirm that they have removed them.  You received a Right Knee Joint steroid injection today. - Lidocaine numbing medicine may ease the pain initially for a few hours until it wears off - As discussed, you may experience a "steroid flare" this evening or within 24-48 hours, anytime medicine is injected into an inflamed joint it can cause the pain to get worse temporarily - Everyone responds differently to these injections, it depends on the patient and the severity of the joint problem, it may provide anywhere from days to weeks, to months of relief. Ideal response is >6 months relief - Try to take it easy for next 1-2 days, avoid over activity and strain on joint (limit walking for knee) - Recommend the following:   - For swelling - rest, compression sleeve / ACE wrap, elevation, and ice packs as needed for first few days   - For pain in future may use heating pad or moist heat as needed  Medication Continue meloxicam DISCONTINUED Gabapentin, Baclofen   DUE for FASTING BLOOD WORK (no food or drink after midnight before the lab appointment, only water or coffee without cream/sugar on the morning of)  SCHEDULE "Lab Only" visit in the morning at the clinic for lab draw in 4 MONTHS   - Make sure Lab Only appointment is at about 1 week before your next appointment, so that results will be available  For Lab Results, once available within 2-3 days of blood draw, you can can log in to MyChart online to view your results and a brief explanation. Also, we can discuss results at next follow-up visit.   Please schedule a Follow-up Appointment to: Return in about 4 months (around 06/25/2018) for Annual Physical.  If you have any other questions or concerns, please feel free to call the office or send a message through [place]. You may also schedule an earlier  appointment if necessary.  Additionally, you may be receiving a survey about your experience at our office within a few days to 1 week by e-mail or mail. We value your feedback.  [name], DO [place]

## 2018-02-24 NOTE — Progress Notes (Signed)
Subjective:   Randy [name] is a 84 y.o. male who presents for Medicare Annual/Subsequent preventive examination.  Review of Systems:  Cardiac Risk Factors include: advanced age (>24men, >82 women);hypertension;dyslipidemia;male gender;obesity (BMI >30kg/m2)     Objective:    Vitals: BP 130/82 (BP Location: Right Arm, Patient Position: Sitting, Cuff Size: Normal)   Pulse 64   Temp 97.9 F (36.6 C) (Oral)   Resp 15   Ht 6' (1.829 m)   Wt 237 lb 9.6 oz (107.8 kg)   BMI 32.22 kg/m   Body mass index is 32.22 kg/m.  Advanced Directives 02/24/2018 11/26/2016 08/30/2016 07/25/2016  Does Patient Have a Medical Advance Directive? Yes Yes Yes No  Type of Advance Directive Living will;Healthcare Power of [place];Living will [place];Living will -  Copy of Hooks in Chart? No - copy requested No - copy requested - -    Tobacco Social History   Tobacco Use  Smoking Status Former Smoker  . Packs/day: 1.00  . Years: 12.00  . Pack years: 12.00  . Types: Cigarettes  . Last attempt to quit: 1968  . Years since quitting: 52.1  Smokeless Tobacco Former Engineer, structural given: Not Answered   Clinical Intake:  Pre-visit preparation completed: Yes  Pain : 0-10 Pain Score: 5  Pain Type: Acute pain Pain Orientation: Left, Right Pain Descriptors / Indicators: Sharp, Aching Pain Onset: 1 to 4 weeks ago Pain Frequency: Constant Pain Relieving Factors: pain medications   Pain Relieving Factors: pain medications   Nutritional Status: BMI > 30  Obese Nutritional Risks: None Diabetes: No  How often do you need to have someone help you when you read instructions, pamphlets, or other written materials from your doctor or pharmacy?: 1 - Never What is the last grade level you completed in school?: 2 years college   Interpreter Needed?: No  Information entered by :: Tiffany HIll,LPN   Past Medical History:    Diagnosis Date  . GERD (gastroesophageal reflux disease)   . Hyperlipidemia   . Hypertension   . Osteoporosis    Past Surgical History:  Procedure Laterality Date  . GANGLION CYST EXCISION Left 2013   Left upper arm  . HERNIA REPAIR N/A 2008   MIdline, ventral acquired hernia repair   Family History  Problem Relation Age of Onset  . Breast cancer Mother   . Lung cancer Father   . Heart disease Brother   . Leukemia Brother   . Prostate cancer Neg Hx   . Colon cancer Neg Hx   . Bladder Cancer Neg Hx   . Kidney cancer Neg Hx    Social History   Socioeconomic History  . Marital status: Married    Spouse name: Not on file  . Number of children: Not on file  . Years of education: College  . Highest education level: Not on file  Occupational History  . Occupation: Retired Arboriculturist  . Financial resource strain: Not hard at all  . Food insecurity:    Worry: Never true    Inability: Never true  . Transportation needs:    Medical: No    Non-medical: No  Tobacco Use  . Smoking status: Former Smoker    Packs/day: 1.00    Years: 12.00    Pack years: 12.00    Types: Cigarettes    Last attempt to quit: 1968    Years since quitting: 52.1  .  Smokeless tobacco: Former Network engineer and Sexual Activity  . Alcohol use: No  . Drug use: No  . Sexual activity: Not on file  Lifestyle  . Physical activity:    Days per week: 0 days    Minutes per session: 0 min  . Stress: Not at all  Relationships  . Social connections:    Talks on phone: Once a week    Gets together: Once a week    Attends religious service: 1 to 4 times per year    Active member of club or organization: Yes    Attends meetings of clubs or organizations: More than 4 times per year    Relationship status: Married  Other Topics Concern  . Not on file  Social History Narrative  . Not on file    Outpatient Encounter Medications as of 02/24/2018  Medication Sig  . aspirin EC 81 MG  tablet Take 81 mg by mouth daily.  . cholecalciferol (VITAMIN D) 1000 units tablet Take 1,000 Units by mouth daily.  . Coenzyme Q10 (CO Q 10) 100 MG CAPS Take by mouth.  . enalapril (VASOTEC) 20 MG tablet Take 1 tablet (20 mg total) by mouth 2 (two) times daily.  . meloxicam (MOBIC) 15 MG tablet Take 1 tablet (15 mg total) by mouth daily as needed (moderate back pain).  . Omega-3 Fatty Acids (FISH OIL) 1200 MG CAPS Take by mouth.  . fluticasone (FLONASE) 50 MCG/ACT nasal spray fluticasone propionate 50 mcg/actuation nasal spray,suspension  . furosemide (LASIX) 20 MG tablet Take 1 tablet (20 mg total) by mouth daily as needed for edema. For 1 week take 2 pills in morning then resume normal dose. (Patient not taking: Reported on 02/24/2018)  . ranitidine (ZANTAC) 150 MG tablet Take 1 tablet (150 mg total) by mouth 2 (two) times daily. (Patient not taking: Reported on 02/24/2018)  . rosuvastatin (CRESTOR) 5 MG tablet TAKE 1 TABLET (5 MG TOTAL) BY MOUTH 3 (THREE) TIMES A WEEK. (Patient not taking: Reported on 02/24/2018)  . [DISCONTINUED] acetic acid-hydrocortisone (VOSOL-HC) OTIC solution Place 3 drops into both ears 3 (three) times daily. For up to 7-10 days until healed (Patient not taking: Reported on 02/24/2018)  . [DISCONTINUED] mupirocin ointment (BACTROBAN) 2 % Apply 1 application topically 2 (two) times daily. For up to 7-10 days as needed for sores on arms (Patient not taking: Reported on 02/24/2018)   No facility-administered encounter medications on file as of 02/24/2018.     Activities of Daily Living In your present state of health, do you have any difficulty performing the following activities: 02/24/2018  Hearing? Y  Comment needs new hearing aids   Vision? N  Comment no probelm with glasses on   Difficulty concentrating or making decisions? Y  Walking or climbing stairs? Y  Dressing or bathing? N  Doing errands, shopping? N  Comment only long distances, son helps   Conservation officer, nature and  eating ? N  Using the Toilet? N  In the past six months, have you accidently leaked urine? Y  Comment wears pads for protection   Do you have problems with loss of bowel control? N  Managing your Medications? N  Managing your Finances? N  Housekeeping or managing your Housekeeping? N  Some recent data might be hidden    Patient Care Team: [name], DO as PCP - General (Family Medicine)   Assessment:   This is a routine wellness examination for Raymel.  Exercise Activities and Dietary recommendations  Current Exercise Habits: The patient does not participate in regular exercise at present, Exercise limited by: None identified  Goals    . DIET - INCREASE WATER INTAKE     Recommend drinking at least 4-5 glasses of water a day        Fall Risk Fall Risk  02/24/2018 02/02/2018 01/12/2018 11/14/2017 10/31/2017  Falls in the past year? 1 0 0 0 No  Number falls in past yr: 1 - - - -  Injury with Fall? 1 - - - -  Risk for fall due to : History of fall(s) - - - -  Follow up - Falls evaluation completed Falls evaluation completed - -   FALL RISK PREVENTION PERTAINING TO THE HOME:  Any stairs in or around the home? Yes  If so, are there any without handrails? No   Home free of loose throw rugs in walkways, pet beds, electrical cords, etc? Yes  Adequate lighting in your home to reduce risk of falls? Yes   ASSISTIVE DEVICES UTILIZED TO PREVENT FALLS:  Life alert? No  Use of a cane, walker or w/c? Yes  Grab bars in the bathroom? Yes  Shower chair or bench in shower? Yes  Elevated toilet seat or a handicapped toilet? No   DME ORDERS:  DME order needed?  Yes   TIMED UP AND GO:  Was the test performed? No .  Length of time to ambulate 10 feet: 11 sec.   GAIT:  Appearance of gait: Gait stead-fast without the use of an assistive device.  Education: Fall risk prevention has been discussed.  Intervention(s) required? No    Depression Screen PHQ 2/9 Scores  02/02/2018 01/12/2018 11/14/2017 10/31/2017  PHQ - 2 Score 0 0 0 0    Cognitive Function     6CIT Screen 02/24/2018 11/26/2016  What Year? 0 points 0 points  What month? 0 points 0 points  What time? 0 points 0 points  Count back from 20 0 points 0 points  Months in reverse 0 points 2 points  Repeat phrase 2 points 6 points  Total Score 2 8    Immunization History  Administered Date(s) Administered  . Influenza, High Dose Seasonal PF 10/01/2016, 10/07/2017  . Pneumococcal Conjugate-13 09/11/2016  . Pneumococcal Polysaccharide-23 10/31/2017  . Tdap 03/05/2016    Qualifies for Shingles Vaccine? Yes  Zostavax completed n/a Due for Shingrix. Education has been provided regarding the importance of this vaccine. Pt has been advised to call insurance company to determine out of pocket expense. Advised may also receive vaccine at local pharmacy or Health Dept. Verbalized acceptance and understanding.  Tdap: up to date   Flu Vaccine: up to date   Pneumococcal Vaccine: up to date   Screening Tests Health Maintenance  Topic Date Due  . TETANUS/TDAP  03/05/2026  . INFLUENZA VACCINE  Completed  . PNA vac Low Risk Adult  Completed   Cancer Screenings:  Colorectal Screening: No longer required   Lung Cancer Screening: (Low Dose CT Chest recommended if Age 24-80 years, 30 pack-year currently smoking OR have quit w/in 15years.) does not qualify.   Lung Cancer Screening Referral: An Epic message has been sent to Burgess Estelle, RN (Oncology Nurse Navigator) regarding the possible need for this exam. Raquel Sarna will review the patient's chart to determine if the patient truly qualifies for the exam. If the patient qualifies, Raquel Sarna will order the Low Dose CT of the chest to facilitate the scheduling of this exam.  Additional Screening:  Hepatitis C Screening: does not qualify  Vision Screening: Recommended annual ophthalmology exams for early detection of glaucoma and other disorders of the  eye. Is the patient up to date with their annual eye exam?  Yes  Who is the provider or what is the name of the office in which the pt attends annual eye exams? Goes annually, cant think of the name    Dental Screening: Recommended annual dental exams for proper oral hygiene  Community Resource Referral:  CRR required this visit?  Yes  has a problem going up stairs in garage , he is looking for someone to possibly install an elevator.       Plan:    I have personally reviewed and addressed the Medicare Annual Wellness questionnaire and have noted the following in the patient's chart:  A. Medical and social history B. Use of alcohol, tobacco or illicit drugs  C. Current medications and supplements D. Functional ability and status E.  Nutritional status F.  Physical activity G. Advance directives H. List of other physicians I.  Hospitalizations, surgeries, and ER visits in previous 12 months J.  [place] such as hearing and vision if needed, cognitive and depression L. Referrals and appointments   In addition, I have reviewed and discussed with patient certain preventive protocols, quality metrics, and best practice recommendations. A written personalized care plan for preventive services as well as general preventive health recommendations were provided to patient.   Signed,  Tyler Aas, LPN Nurse Health Advisor   Nurse Notes:none

## 2018-02-25 ENCOUNTER — Ambulatory Visit: Payer: Medicare Other | Admitting: Family Medicine

## 2018-03-02 ENCOUNTER — Other Ambulatory Visit: Payer: Self-pay | Admitting: Family Medicine

## 2018-03-02 DIAGNOSIS — Z9889 Other specified postprocedural states: Secondary | ICD-10-CM

## 2018-03-02 DIAGNOSIS — E782 Mixed hyperlipidemia: Secondary | ICD-10-CM

## 2018-03-02 DIAGNOSIS — M79622 Pain in left upper arm: Secondary | ICD-10-CM

## 2018-03-16 ENCOUNTER — Telehealth: Payer: Self-pay | Admitting: Family Medicine

## 2018-03-16 NOTE — Telephone Encounter (Signed)
° ° °  Called Pt regarding Community Resource Referral for stair lift/home safety modifications resources see detailed notes in Referral.    [place]  ??[email or web address]  ?? [phone]   Skype

## 2018-03-16 NOTE — Telephone Encounter (Signed)
° ° ° °  ° ° ° ° °  Pt returned call discussed stair lift wheel chair lift options, will place a [facility] referral to find other resources to help assist pt. Meet with pt at 10:30am on Wednesday 3/11 at [facility] exam room. knb

## 2018-03-18 ENCOUNTER — Telehealth: Payer: Self-pay | Admitting: Family Medicine

## 2018-03-18 NOTE — Telephone Encounter (Signed)
Meeting with Randy [name] at [facility] see detailed notes in referral. knb

## 2018-03-18 NOTE — Telephone Encounter (Signed)
Thanks for reaching out to me.  I have seen Randy [name] for this same issue related to his knee pain and arthritis. On 11/14/17 and we attempted to address this exact concern. I spoke with [employer] at that time, they required the patient to call to inquire about this service, and he had subsequently told me that it was not covered.  I am happy to proceed if he would like to - we can see him again and document again about his arthritis and his limitations, we can do basic gait evaluation here. But ultimately, the recommendation would be for him to have a formal PT or functional evaluation and documented assessment that would recommend this equipment is medically necessary, and then we would likely be able to use that.  I have not ordered this before or proceeded with this type of authorization, therefore, I would await further instruction from his insurance if there is any special requirement or form or assessment that is needed. Also the PT would likely be able to guide [place] further in this regard.  I would prefer to see him AFTER he has already had assessment from the Vocational Rehab so I can review their recommendations.  [name], [facility] 03/18/2018, 12:25 PM

## 2018-03-18 NOTE — Telephone Encounter (Signed)
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 I will keep you updated with what I hear back from them. Thank you!   [place] ?[place] ? ??[email or web address]  ?? [phone]   Skype  [place] [facility] [place]

## 2018-03-18 NOTE — Telephone Encounter (Signed)
Sounds good. Hopefully they can help provide the documentation and recommendation needed to proceed with an authorization.  Will stay tuned.  [name], [facility] 03/18/2018, 2:22 PM

## 2018-03-18 NOTE — Telephone Encounter (Signed)
Nisha,  Thank you for getting this info to Dr. [name].  During my visit with Mr. Fisch this morning we contacted [facility] Medicare regarding assistance with paying for a wheelchair lift installed in Mr. Dobie garage.  He has 6 stairs leading from his garage to the ground floor of their home into the kitchen and he is concerned because he has had a couple of falls so far in the home I believe and was worried that this entry is not safe for him or his wife. (His wife did not come with him today because she had been dizzy this morning) Pt was not open to a wheelchair ramp installed in the garage or in front entry of home (due to space) and was also not open to a Seat lift (mounted to wall where chair rail would be to help assist him going up stairs) He stated that he wanted something long term as he believes this will better suit them if he is to have to use a wheelchair in the future.  He did mention that he had discussed possible measures to help prevent falls with Dr. [name] in a previous visit.  [facility] Medicare rep stated that he would need a face to face visit with MD and to receive prior authorization in order to try to file a claim for this medical equipment to be installed. I told him that I would ask what Dr. [name] thought about this and that he may want Mr. Rollo to do another gait analysis or the like to determine need.  I have also sent a referral to [place] and they should be reaching out to him soon to set up a time for an assessment.  Thanks and let me know if you have any other questions.  Jill Alexanders [phone] [email or web address]

## 2018-03-20 ENCOUNTER — Telehealth: Payer: Self-pay | Admitting: Family Medicine

## 2018-03-20 NOTE — Telephone Encounter (Signed)
° °  Follow up to [facility] Referral for pt for [facility] Referral, see updated referral notes LMTCB.  [place]  ??[email or web address]   ??[phone]   1Skype

## 2018-03-20 NOTE — Telephone Encounter (Signed)
° °  Follow up to [facility] Referral for pt for [facility] Referral, see updated referral notes. Spoke with pt to discuss Vocational Rehabilitation assessment. [place]  ??[email or web address]   ??[phone]   1Skype

## 2018-03-24 ENCOUNTER — Telehealth: Payer: Self-pay | Admitting: Family Medicine

## 2018-03-24 NOTE — Telephone Encounter (Signed)
Dr. [name], Wanted to bring you up to speed. I received follow-up phone call from Randy [name], [place] Associate with [place] of Vocational Rehab with an update on Randy [name].  They are not currently doing home visits to assess patients living arrangements due to a halt on face to face encounters and COVID-19 restrictions.  She indicated that they would do as much as they could with his case over the phone and I also gave her Randy [name] daughter's phone # that she could possibly reach out to (if patient consented) and perhaps consider a FaceTime or Skype call to view the home and garage steps virtually using her phone.   [name] indicated that once the assessment was completed she will reach out to [facility] to request the medical records needed.  The engineers that install wheel chair ramps and DME are also under restrictions and are not allowed to enter patient's homes until COVID-19 limitations are lifted. Her best guess is probably another 2 months out from installation.  I called the pt to update him with this info and for him to expect a follow-up phone call from [place] this week. Randy [name] mentioned that he did find a hand-written prescription from you from 10/01/17 for a wheelchair lift, I wasn't sure if that would be in the chart or not & asked Randy [name] to take a look too but we couldn't find it.  Let me know if you have any questions.  Thanks, Randy [name]

## 2018-03-24 NOTE — Telephone Encounter (Signed)
Randy [name],  Thanks for the updates and I really appreciate your detailed help with this! A lot of good information at this point, unfortunate to hear so many examples of limitations due to [place] on other unrelated health issues - but I certainly understand the precautions.  I agree with this plan, if they can initiate this virtually that is fine. We can help with medical records. However, keep in mind - of the office visit dates he has seen me for related problem, back in 11/2017 we documented the initial plan to proceed with this, and since then the focus has more been on documenting his pain and arthritis and treatments for that specific problem.  I would ask 2 follow-up questions that would help :  1. It would be helpful to know if there is a time sensitive date required for the medical records - for instance, if face to face encounter with me has to be within 30 60 or 90 etc days of an assessment or the order etc  2. Assessment sounds like a good plan - but if they need medical documentation from me - usually I would need to know the specific "medical necessity" criteria that would need to be met to qualify him for having this device approved - so that I could include that in my documentation.  Thanks! Let me know - we may have to have him return for an office visit after assessment, so that I can document medical necessity once I have that info.  Randy [name], [facility] 03/24/2018, 5:10 PM

## 2018-03-25 NOTE — Telephone Encounter (Signed)
Excellent! This is very helpful info. I will stay tuned for next update on his eval and when he is scheduled.  [name], [facility] 03/25/2018, 1:07 PM

## 2018-03-25 NOTE — Telephone Encounter (Signed)
Thanks Dr. [name]!  Here is the response back from the Caseworker. Will also find out about the date requirements from [place] as well and will make sure that we don't miss that window for when the follow-up visit with you is scheduled after pt has been assessed by [facility] and before the medical records are sent to them.  [place]  From: Mardene Sayer @[email or web address]>  Sent: Wednesday, March 25, 2018 11:06 AM To: Jill Alexanders [facility]) [email or web address]> Subject: RE: [External] RE: Referral     The medical documentation that we are looking for in the medical records is the following: 1- Documentation of a chronic medical conditions 2- How these chronic medical conditions limit his mobility and ability to provide self-care. It is helpful if there is documentation showing the need of assistive device for ambulation and if he needs assistance with personal care tasks like bathing, dressing and grooming.  3- His inability to climb stairs since he is asking for a lift from the garage.   Thanks for your help!

## 2018-04-08 NOTE — Telephone Encounter (Signed)
Spoke with pt this morning Case Worker left a voicemail on home and daughter's cell and I called to follow up with him and give him Johnn Hai # to call he stated he would call her this morning 04/08/18.  Dr. Alois Cliche, I gave [facility] your contact info so that she will have it once she has done the assessment.  Thanks! KNB

## 2018-04-08 NOTE — Telephone Encounter (Signed)
Thank you. Will stay tuned.  [name], [facility] 04/08/2018, 9:40 AM

## 2018-04-14 ENCOUNTER — Other Ambulatory Visit: Payer: Self-pay | Admitting: Family Medicine

## 2018-04-14 DIAGNOSIS — M25562 Pain in left knee: Secondary | ICD-10-CM

## 2018-04-14 DIAGNOSIS — M1712 Unilateral primary osteoarthritis, left knee: Secondary | ICD-10-CM

## 2018-04-14 DIAGNOSIS — G8929 Other chronic pain: Secondary | ICD-10-CM

## 2018-04-19 IMAGING — CT CT ABD-PEL WO/W CM
2 of 6 series · 13 of 32 positions shown, 18 images · IV contrast (iopamidol)
Comparison: None.

CLINICAL DATA: 1 episode of gross hematuria x 1 month ago. Denies
hx kidney stones. Hx hernia repair.

EXAM:
CT ABDOMEN AND PELVIS WITHOUT AND WITH CONTRAST
TECHNIQUE: Multidetector CT imaging of the abdomen and pelvis was performed
following the standard protocol before and following the bolus
administration of intravenous contrast.
CONTRAST:  125mL [id number] IOPAMIDOL ([id number]) INJECTION 61%

[Series 4: axial post · axial · 0.83mm/px · z∈[-912,-612]mm · 6 of 97 slices shown]
[im 13/97  soft-tissue]
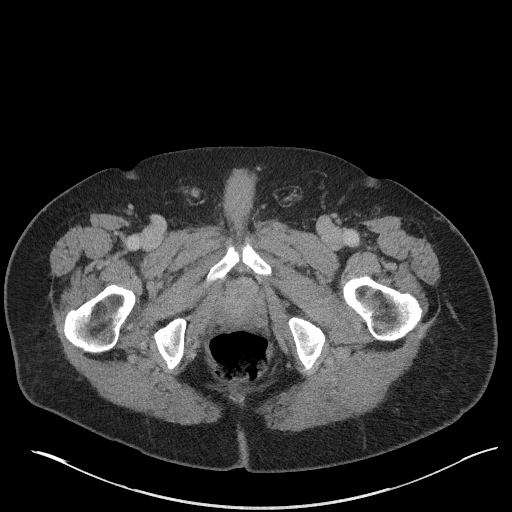
[im 25/97  soft-tissue]
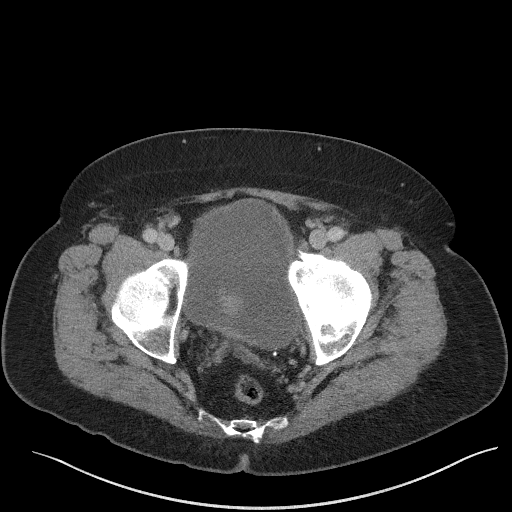
[im 37/97  soft-tissue]
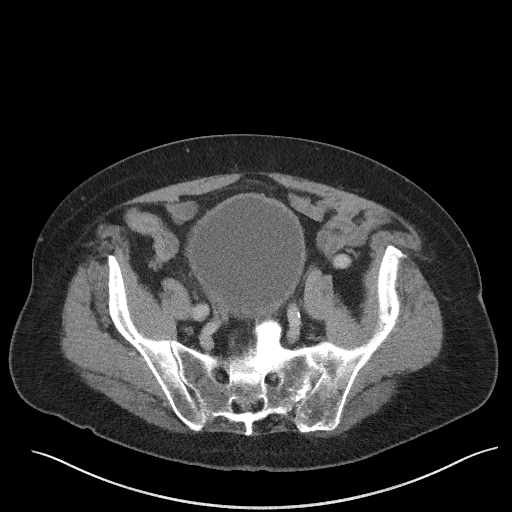
[im 49/97  soft-tissue]
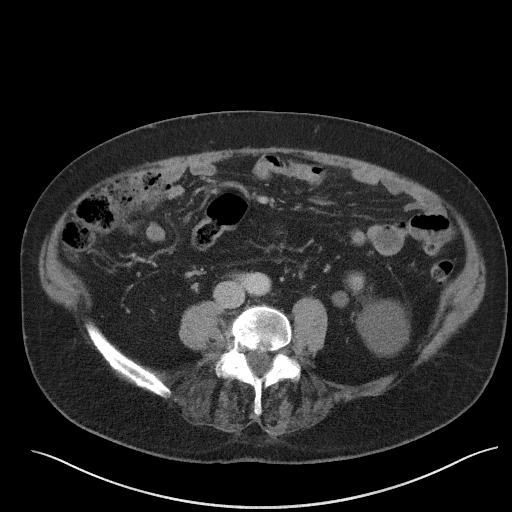
[im 61/97  soft-tissue]
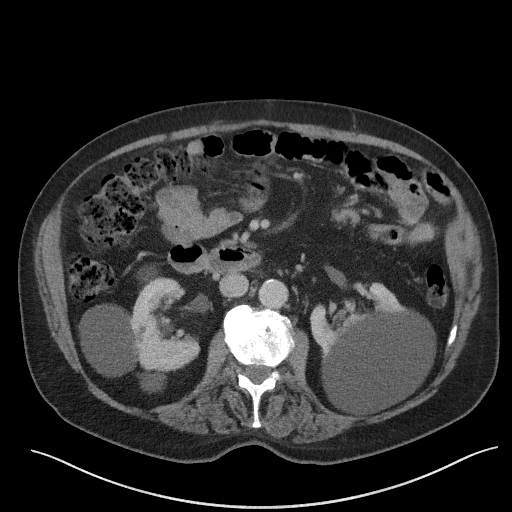
[im 73/97  soft-tissue]
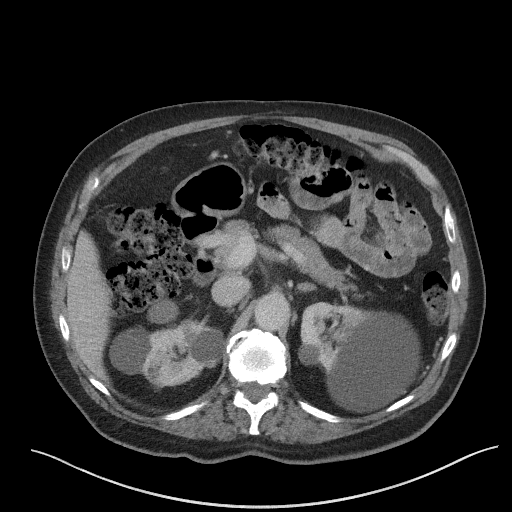

[Series 13: axial delay · axial · delayed · 0.81mm/px · z∈[-907,-527]mm · 7 of 102 slices shown, 12 images]
[im 13/102  soft-tissue]
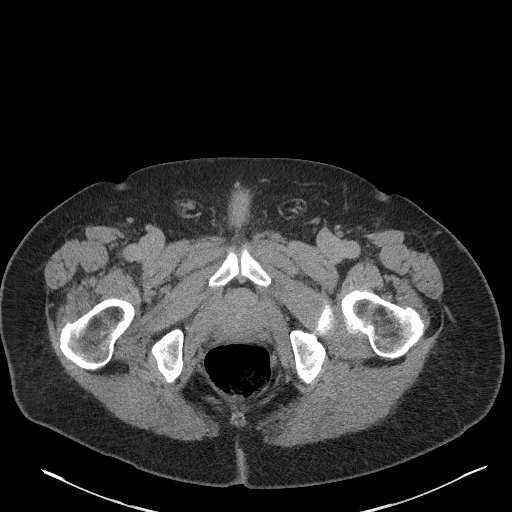
[im 13/102  bone]
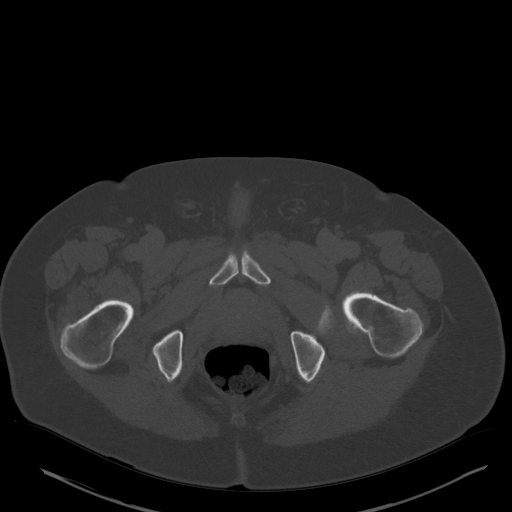
[im 26/102  soft-tissue]
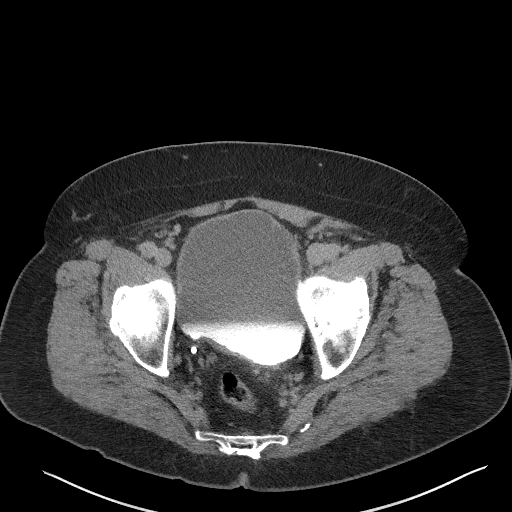
[im 38/102  soft-tissue]
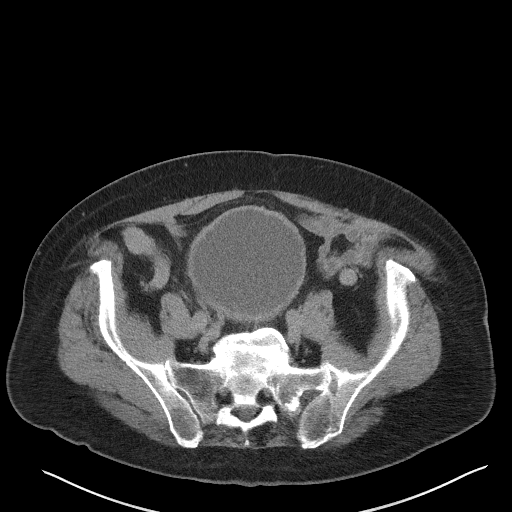
[im 51/102  soft-tissue]
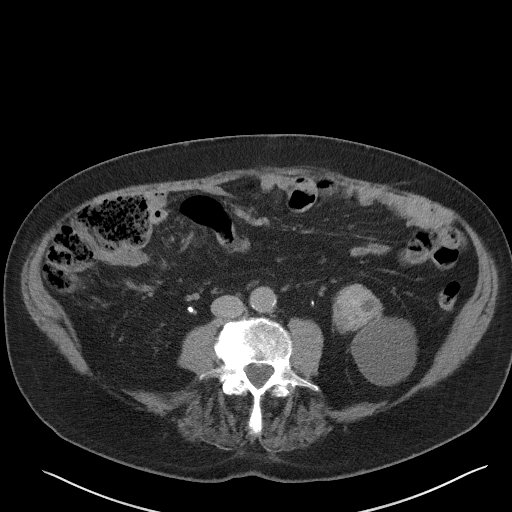
[im 51/102  lung]
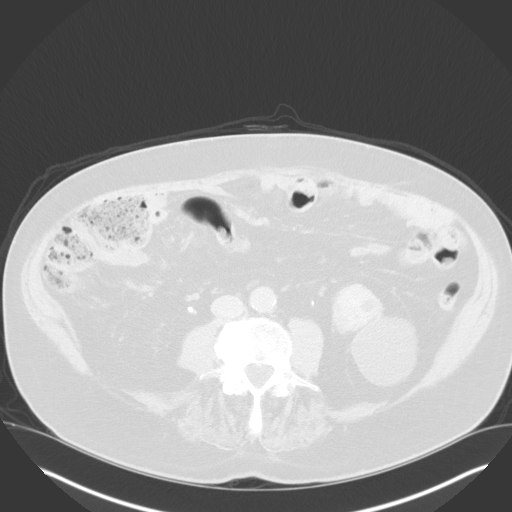
[im 64/102  soft-tissue]
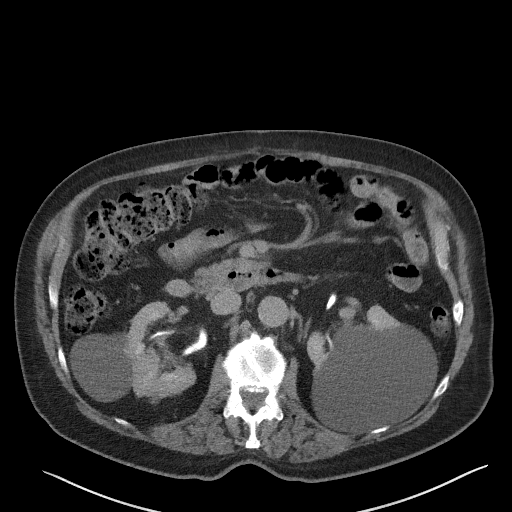
[im 64/102  lung]
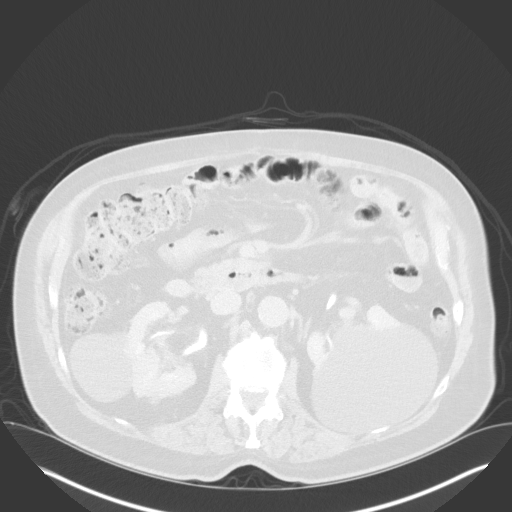
[im 76/102  soft-tissue]
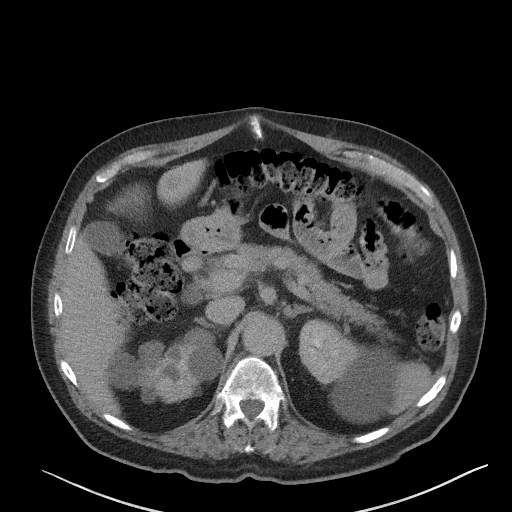
[im 76/102  lung]
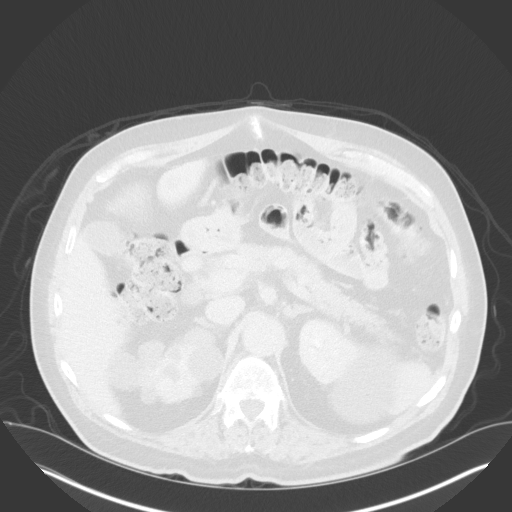
[im 89/102  soft-tissue]
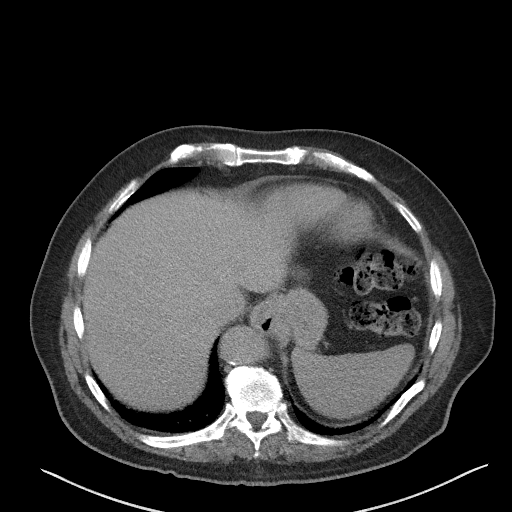
[im 89/102  lung]
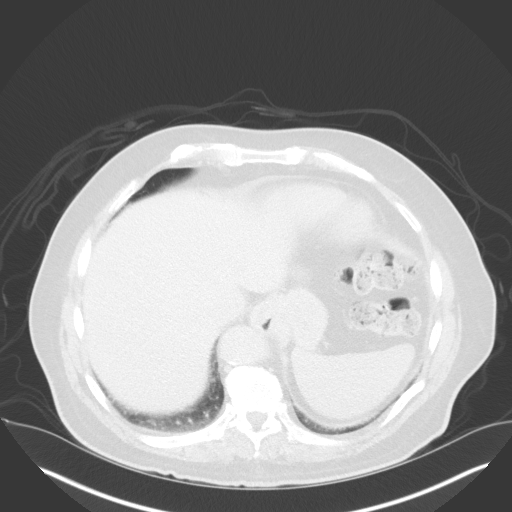

[13 of 32 positions shown; findings below may reference images not displayed]

FINDINGS: Lower chest: Lung bases are clear.

Hepatobiliary: No focal hepatic lesion. Multiple small gallstones
present

Pancreas: Pancreas is normal. No ductal dilatation. No pancreatic
inflammation.

Spleen: Normal spleen

Adrenals/urinary tract: Adrenal glands normal.

There bilateral multiple mixed density round renal lesions. None of
the lesions demonstrate post-contrast enhancement. Approximately 15
lesions in the RIGHT kidney. Approximately 6 lesions in the LEFT
kidney which are larger than the RIGHT but again without
enhancement.

No ureterolithiasis or obstructive uropathy. Delayed imaging
demonstrates no filling defects within collecting systems or
ureters.

No bladder calculi. No enhancing bladder lesion. There is mildly
complex diverticulum extending from the dome of the bladder. The
prostate gland indents the base the bladder. No filling defect
within the bladder on delayed imaging. Some bladder wall
trabeculation noted.

Stomach/Bowel: Stomach, small bowel, appendix, and cecum are normal.
The colon and rectosigmoid colon are normal.

Vascular/Lymphatic: Abdominal aorta is normal caliber. There is no
retroperitoneal or periportal lymphadenopathy. No pelvic
lymphadenopathy.

Reproductive: Nodular prostate gland indents the base the bladder.

Other: No free fluid.

Musculoskeletal: No aggressive osseous lesion.
IMPRESSION: 1. Multiple Bosniak 1 and Bosniak 2 nonenhancing renal cysts within
the LEFT and RIGHT kidney.
2. No ureterolithiasis or obstructive uropathy.
3. No bladder calculi are enhancing bladder lesion.
4. Bladder diverticulum noted.
5. Prostate gland indents the base of the bladder.

## 2018-04-21 ENCOUNTER — Ambulatory Visit (INDEPENDENT_AMBULATORY_CARE_PROVIDER_SITE_OTHER): Payer: Medicare Other | Admitting: Family Medicine

## 2018-04-21 ENCOUNTER — Encounter: Payer: Self-pay | Admitting: Family Medicine

## 2018-04-21 ENCOUNTER — Other Ambulatory Visit: Payer: Self-pay

## 2018-04-21 DIAGNOSIS — G8929 Other chronic pain: Secondary | ICD-10-CM

## 2018-04-21 DIAGNOSIS — M545 Low back pain, unspecified: Secondary | ICD-10-CM

## 2018-04-21 DIAGNOSIS — M150 Primary generalized (osteo)arthritis: Secondary | ICD-10-CM

## 2018-04-21 DIAGNOSIS — M159 Polyosteoarthritis, unspecified: Secondary | ICD-10-CM

## 2018-04-21 DIAGNOSIS — M8949 Other hypertrophic osteoarthropathy, multiple sites: Secondary | ICD-10-CM

## 2018-04-21 MED ORDER — PREDNISONE 20 MG PO TABS: ORAL_TABLET | ORAL | 0 refills | Status: DC

## 2018-04-21 NOTE — Progress Notes (Signed)
Virtual Visit via Telephone The purpose of this virtual visit is to provide medical care while limiting exposure to the novel coronavirus (COVID19) for both patient and office staff.  Consent was obtained for phone visit:  Yes.   Answered questions that patient had about telehealth interaction:  Yes.   I discussed the limitations, risks, security and privacy concerns of performing an evaluation and management service by telephone. I also discussed with the patient that there may be a patient responsible charge related to this service. The patient expressed understanding and agreed to proceed.  Patient Location: Home Provider Location: [facility])  ---------------------------------------------------------------------- Chief Complaint  Patient presents with  . Back Pain    onset couple of days which is getting worst    S: Reviewed CMA documentation. I have called patient and gathered additional HPI as follows:  Acute on Chronic Low Back Pain / Osteoarthritis Multiple Joints / Lumbar Spine - Last visit with me for same issue 1 year ago 03/2017, for same problem, also has been treated for knee arthritis for multiple visits in past 1 year, see prior notes for background information. - Interval update with now recent flare up of low back pain past few days, he has known chronic history of back pain and arthritis - Today reports persistent gradual worsening low back bilateral aching pain, describes moderate to severe pain, worse at times episodically due to position changes sitting to laying, improved if resting and laying in correct position. Denies any radiating pain down leg or sciatica nerve symptoms - He has been taking Meloxicam 15mg  daily PRN - recently almost daily with mixed results, some day improved, not every day it helps - Taking Tylenol 500mg  x 2-3 per dose a few times a day PRN - Not using heating pad but he does have one available - History of lumbar OA/DJD,  known moderate to severe lumbar DJD on last X-ray 2016 documented - Never had back surgery - Denies any fevers/chills, numbness, tingling, weakness, loss of control bladder/bowel incontinence or retention, unintentional wt loss, night sweats  Denies any high risk travel to areas of current concern for COVID19. Denies any known or suspected exposure to person with or possibly with COVID19.  Denies any fevers, chills, sweats, body ache, cough, shortness of breath, sinus pain or pressure, headache, abdominal pain, diarrhea  Past Medical History:  Diagnosis Date  . GERD (gastroesophageal reflux disease)   . Hyperlipidemia   . Hypertension   . Osteoporosis    Social History   Tobacco Use  . Smoking status: Former Smoker    Packs/day: 1.00    Years: 12.00    Pack years: 12.00    Types: Cigarettes    Last attempt to quit: 1968    Years since quitting: 52.3  . Smokeless tobacco: Former Network engineer Use Topics  . Alcohol use: No  . Drug use: No    Current Outpatient Medications:  .  aspirin EC 81 MG tablet, Take 81 mg by mouth daily., Disp: , Rfl:  .  cholecalciferol (VITAMIN D) 1000 units tablet, Take 1,000 Units by mouth daily., Disp: , Rfl:  .  Coenzyme Q10 (CO Q 10) 100 MG CAPS, Take by mouth., Disp: , Rfl:  .  enalapril (VASOTEC) 20 MG tablet, Take 1 tablet (20 mg total) by mouth 2 (two) times daily., Disp: 180 tablet, Rfl: 3 .  fluticasone (FLONASE) 50 MCG/ACT nasal spray, fluticasone propionate 50 mcg/actuation nasal spray,suspension, Disp: , Rfl:  .  furosemide (LASIX) 20 MG tablet, Take 1 tablet (20 mg total) by mouth daily as needed for edema. For 1 week take 2 pills in morning then resume normal dose., Disp: 45 tablet, Rfl: 2 .  meloxicam (MOBIC) 15 MG tablet, TAKE 1 TABLET (15 MG TOTAL) BY MOUTH DAILY AS NEEDED (MODERATE BACK PAIN)., Disp: 30 tablet, Rfl: 2 .  Omega-3 Fatty Acids (FISH OIL) 1200 MG CAPS, Take by mouth., Disp: , Rfl:  .  predniSONE (DELTASONE) 20 MG  tablet, Take daily with food. Start with 60mg  (3 pills) x 2 days, then reduce to 40mg  (2 pills) x 2 days, then 20mg  (1 pill) x 3 days, Disp: 13 tablet, Rfl: 0  Depression screen [facility] 2/9 04/21/2018 02/02/2018 01/12/2018  Decreased Interest 0 0 0  Down, Depressed, Hopeless 0 0 0  PHQ - 2 Score 0 0 0    No flowsheet data found.  -------------------------------------------------------------------------- O: No physical exam performed due to remote telephone encounter.  No results found for this or any previous visit (from the past 2160 hour(s)).  -------------------------------------------------------------------------- A&P:  Problem List Items Addressed This Visit    Chronic low back pain - Primary   Relevant Medications   predniSONE (DELTASONE) 20 MG tablet   Osteoarthritis of multiple joints   Relevant Medications   predniSONE (DELTASONE) 20 MG tablet     Acute on chronic bilateral LBP without associated sciatica. Suspect likely due to muscle spasm/strain, without known injury or trauma. In setting of known chronic LBP with DJD, no prior surgery. - No red flag symptoms or radiculopathy reported Not responding to conservative therapy - but some inadequate treatments - Failed prior other meds including Gabapentin, Baclofen for other arthritis complaints  Plan: 1. HOLD Meloxicam temporarily - START Prednisone 7 day taper as instructed and rx - counsel on temporary side effects of steroid including swelling 2. May resume Meloxicam 15mg  daily after finish prednisone 3. May use Tylenol PRN for breakthrough 4. Encouraged use of heating pad 1-2x daily for now then PRN 5.  Follow-up 2-4 weeks if not improved for re-evaluation, consider refer to ortho or to ortho urgent care for repeat X-ray imaging, trial of PT   Meds ordered this encounter  Medications  . predniSONE (DELTASONE) 20 MG tablet    Sig: Take daily with food. Start with 60mg  (3 pills) x 2 days, then reduce to 40mg  (2 pills) x  2 days, then 20mg  (1 pill) x 3 days    Dispense:  13 tablet    Refill:  0    Follow-up: - Return in 2-4 weeks as needed back pain  Patient verbalizes understanding with the above medical recommendations including the limitation of remote medical advice.  Specific follow-up and call-back criteria were given for patient to follow-up or seek medical care more urgently if needed.   - Time spent in direct consultation with patient on phone: 12 minutes  [name], [facility] 04/21/2018, 1:57 PM

## 2018-04-21 NOTE — Patient Instructions (Addendum)
AVS info given to patient via telephone. No MyChart access.

## 2018-05-11 ENCOUNTER — Telehealth: Payer: Self-pay | Admitting: Family Medicine

## 2018-05-11 DIAGNOSIS — M545 Low back pain, unspecified: Secondary | ICD-10-CM

## 2018-05-11 DIAGNOSIS — G8929 Other chronic pain: Secondary | ICD-10-CM

## 2018-05-11 MED ORDER — TRAMADOL HCL 50 MG PO TABS: 50.0000 mg | ORAL_TABLET | Freq: Three times a day (TID) | ORAL | 0 refills | Status: AC | PRN

## 2018-05-11 NOTE — Telephone Encounter (Signed)
Last office visit with me 04/21/18 for same issue back pain. I called patient back today, now 3 weeks later, he was given rx prednisone at that time, it did help temporarily but pain returned after finished prednisone.  Failed prior other meds including Gabapentin, Baclofen for other arthritis complaints, now prednisone.  I offered Tramadol pain medication, he would like to try it, it is short term only from me, to see if it can provide relief, we may have to consider this option in future if no other options.  Also I will refer him to [facility] for 2nd opinion  Orders Placed This Encounter  Procedures  . Ambulatory referral to Orthopedic Surgery    Referral Priority:   Routine    Referral Type:   Surgical    Referral Reason:   Specialty Services Required    Requested Specialty:   Orthopedic Surgery    Number of Visits Requested:   1     [name], [facility] 05/11/2018, 4:40 PM

## 2018-05-11 NOTE — Telephone Encounter (Signed)
Pt  Called requesting something to be called in for his back pain said that the last mediation that was called in did not help  Wanted something stronger called in to  [facility]

## 2018-06-10 ENCOUNTER — Other Ambulatory Visit: Payer: Medicare Other

## 2018-06-10 ENCOUNTER — Other Ambulatory Visit: Payer: Self-pay

## 2018-06-10 DIAGNOSIS — M8949 Other hypertrophic osteoarthropathy, multiple sites: Secondary | ICD-10-CM

## 2018-06-10 DIAGNOSIS — N400 Enlarged prostate without lower urinary tract symptoms: Secondary | ICD-10-CM

## 2018-06-10 DIAGNOSIS — E782 Mixed hyperlipidemia: Secondary | ICD-10-CM

## 2018-06-10 DIAGNOSIS — Z0000 Encounter for general adult medical examination without abnormal findings: Secondary | ICD-10-CM

## 2018-06-10 DIAGNOSIS — M159 Polyosteoarthritis, unspecified: Secondary | ICD-10-CM

## 2018-06-10 DIAGNOSIS — R7309 Other abnormal glucose: Secondary | ICD-10-CM

## 2018-06-10 DIAGNOSIS — I10 Essential (primary) hypertension: Secondary | ICD-10-CM

## 2018-06-11 LAB — COMPLETE METABOLIC PANEL WITH GFR
AG Ratio: 1.6 (calc) (ref 1.0–2.5)
ALT: 8 U/L — ABNORMAL LOW (ref 9–46)
AST: 15 U/L (ref 10–35)
Albumin: 3.7 g/dL (ref 3.6–5.1)
Alkaline phosphatase (APISO): 67 U/L (ref 35–144)
BUN/Creatinine Ratio: 26 (calc) — ABNORMAL HIGH (ref 6–22)
BUN: 28 mg/dL — ABNORMAL HIGH (ref 7–25)
CO2: 30 mmol/L (ref 20–32)
Calcium: 8.6 mg/dL (ref 8.6–10.3)
Chloride: 104 mmol/L (ref 98–110)
Creat: 1.07 mg/dL (ref 0.70–1.11)
GFR, Est African American: 75 mL/min/{1.73_m2} (ref >=60)
GFR, Est Non African American: 64 mL/min/{1.73_m2} (ref >=60)
Globulin: 2.3 g/dL (calc) (ref 1.9–3.7)
Glucose, Bld: 102 mg/dL — ABNORMAL HIGH (ref 65–99)
Potassium: 3.8 mmol/L (ref 3.5–5.3)
Sodium: 141 mmol/L (ref 135–146)
Total Bilirubin: 0.9 mg/dL (ref 0.2–1.2)
Total Protein: 6 g/dL — ABNORMAL LOW (ref 6.1–8.1)

## 2018-06-11 LAB — HEMOGLOBIN A1C
Hgb A1c MFr Bld: 5.4 % of total Hgb (ref <5.7)
Mean Plasma Glucose: 108 (calc)
eAG (mmol/L): 6 (calc)

## 2018-06-11 LAB — LIPID PANEL
Cholesterol: 176 mg/dL (ref <200)
HDL: 35 mg/dL — ABNORMAL LOW (ref >=40)
LDL Cholesterol (Calc): 121 mg/dL (calc) — ABNORMAL HIGH
Non-HDL Cholesterol (Calc): 141 mg/dL (calc) — ABNORMAL HIGH (ref <130)
Total CHOL/HDL Ratio: 5 (calc) — ABNORMAL HIGH (ref <5.0)
Triglycerides: 98 mg/dL (ref <150)

## 2018-06-11 LAB — CBC WITH DIFFERENTIAL/PLATELET
Absolute Monocytes: 555 cells/uL (ref 200–950)
Basophils Absolute: 49 cells/uL (ref 0–200)
Basophils Relative: 0.8 %
Eosinophils Absolute: 183 cells/uL (ref 15–500)
Eosinophils Relative: 3 %
HCT: 36.9 % — ABNORMAL LOW (ref 38.5–50.0)
Hemoglobin: 12.1 g/dL — ABNORMAL LOW (ref 13.2–17.1)
Lymphs Abs: 1403 cells/uL (ref 850–3900)
MCH: 30 pg (ref 27.0–33.0)
MCHC: 32.8 g/dL (ref 32.0–36.0)
MCV: 91.3 fL (ref 80.0–100.0)
MPV: 10.7 fL (ref 7.5–12.5)
Monocytes Relative: 9.1 %
Neutro Abs: 3910 cells/uL (ref 1500–7800)
Neutrophils Relative %: 64.1 %
Platelets: 208 10*3/uL (ref 140–400)
RBC: 4.04 10*6/uL — ABNORMAL LOW (ref 4.20–5.80)
RDW: 13.8 % (ref 11.0–15.0)
Total Lymphocyte: 23 %
WBC: 6.1 10*3/uL (ref 3.8–10.8)

## 2018-06-11 LAB — PSA: PSA: 2.7 ng/mL (ref <=4.0)

## 2018-06-17 ENCOUNTER — Encounter: Payer: Self-pay | Admitting: Family Medicine

## 2018-06-17 ENCOUNTER — Other Ambulatory Visit: Payer: Self-pay

## 2018-06-17 ENCOUNTER — Ambulatory Visit (INDEPENDENT_AMBULATORY_CARE_PROVIDER_SITE_OTHER): Payer: Medicare Other | Admitting: Family Medicine

## 2018-06-17 VITALS — BP 163/80 | HR 69 | Temp 98.1°F | Resp 16 | Ht 72.0 in | Wt 242.0 lb

## 2018-06-17 DIAGNOSIS — R55 Syncope and collapse: Secondary | ICD-10-CM

## 2018-06-17 DIAGNOSIS — M159 Polyosteoarthritis, unspecified: Secondary | ICD-10-CM

## 2018-06-17 DIAGNOSIS — R296 Repeated falls: Secondary | ICD-10-CM | POA: Diagnosis not present

## 2018-06-17 DIAGNOSIS — R413 Other amnesia: Secondary | ICD-10-CM | POA: Diagnosis not present

## 2018-06-17 DIAGNOSIS — I10 Essential (primary) hypertension: Secondary | ICD-10-CM

## 2018-06-17 DIAGNOSIS — M150 Primary generalized (osteo)arthritis: Secondary | ICD-10-CM

## 2018-06-17 DIAGNOSIS — R251 Tremor, unspecified: Secondary | ICD-10-CM

## 2018-06-17 DIAGNOSIS — M21371 Foot drop, right foot: Secondary | ICD-10-CM

## 2018-06-17 DIAGNOSIS — M8949 Other hypertrophic osteoarthropathy, multiple sites: Secondary | ICD-10-CM

## 2018-06-17 DIAGNOSIS — M21372 Foot drop, left foot: Secondary | ICD-10-CM

## 2018-06-17 DIAGNOSIS — Z0000 Encounter for general adult medical examination without abnormal findings: Secondary | ICD-10-CM

## 2018-06-17 NOTE — Progress Notes (Signed)
Subjective:    Patient ID: Randy [name], male    DOB: 12-19-1934, 84 y.o.   MRN: [id number]  Randy [name] is a 84 y.o. male presenting on 06/17/2018 for Annual Exam   HPI   Here for Annual Physical and Lab Review.  CHRONIC HTN: Reportsno new concerns, he checks his BP regularly at home, he has had some normal BP readings. In past his BP was elevated we consider adjusting meds. He does not have blood pressure log Current Meds -Enalapril 20mg  daily - No longer taking Furosemide regularly, only PRN now, and rarely takes Reports good compliance, took meds today. Tolerating well, w/o complaints. Lifestyle: - Exercise:improved walking  HYPERLIPIDEMIA: - Reports no concerns. Last lipid panel 06/2018, mild elevated LDL had improved from prior   Additional problems:  Memory Loss / Weakness Generalized / R upper extremity muscle jerking vs shaking / Foot Drop weakness - Right arm/hand jerks and shakes at times - Shuffling feet when walks, difficulty picking up feet with weakness - Falling due to "black out" syncopal episodes - x 2 falls in 3-4 months, last episode 05/2017 due to passing out, syncopal episode, reviewed chart. Never had Home health management. - Difficulty word finding, memory loss gradual worsening He lives at home with his wife, she is home all day. Family support daughter is available.  Follow up back pain osteoarthritis Recent course, he was referred to ortho, but never scheduled yet, he has improved on tramadol PRN  Health Maintenance: UTD currently Pneumonia vaccine, next due for Pneumovax-23 after 09/11/17 UTD TDap   Depression screen [facility] 2/9 06/17/2018 04/21/2018 02/02/2018  Decreased Interest 0 0 0  Down, Depressed, Hopeless 0 0 0  PHQ - 2 Score 0 0 0    Past Medical History:  Diagnosis Date  . GERD (gastroesophageal reflux disease)   . Hyperlipidemia   . Hypertension   . Osteoporosis    Past Surgical History:  Procedure Laterality Date  . GANGLION  CYST EXCISION Left 2013   Left upper arm  . HERNIA REPAIR N/A 2008   MIdline, ventral acquired hernia repair   Social History   Socioeconomic History  . Marital status: Married    Spouse name: Not on file  . Number of children: Not on file  . Years of education: College  . Highest education level: Not on file  Occupational History  . Occupation: Retired Arboriculturist  . Financial resource strain: Not hard at all  . Food insecurity:    Worry: Never true    Inability: Never true  . Transportation needs:    Medical: No    Non-medical: No  Tobacco Use  . Smoking status: Former Smoker    Packs/day: 1.00    Years: 12.00    Pack years: 12.00    Types: Cigarettes    Last attempt to quit: 1968    Years since quitting: 52.4  . Smokeless tobacco: Former Network engineer and Sexual Activity  . Alcohol use: No  . Drug use: No  . Sexual activity: Not on file  Lifestyle  . Physical activity:    Days per week: 0 days    Minutes per session: 0 min  . Stress: Not at all  Relationships  . Social connections:    Talks on phone: Once a week    Gets together: Once a week    Attends religious service: 1 to 4 times per year    Active member of club or organization: Yes  Attends meetings of clubs or organizations: More than 4 times per year    Relationship status: Married  . Intimate partner violence:    Fear of current or ex partner: No    Emotionally abused: No    Physically abused: No    Forced sexual activity: No  Other Topics Concern  . Not on file  Social History Narrative  . Not on file   Family History  Problem Relation Age of Onset  . Breast cancer Mother   . Lung cancer Father   . Heart disease Brother   . Leukemia Brother   . Prostate cancer Neg Hx   . Colon cancer Neg Hx   . Bladder Cancer Neg Hx   . Kidney cancer Neg Hx    Current Outpatient Medications on File Prior to Visit  Medication Sig  . cholecalciferol (VITAMIN D) 1000 units tablet Take  1,000 Units by mouth daily.  . Coenzyme Q10 (CO Q 10) 100 MG CAPS Take by mouth.  . enalapril (VASOTEC) 20 MG tablet Take 1 tablet (20 mg total) by mouth 2 (two) times daily.  . fluticasone (FLONASE) 50 MCG/ACT nasal spray fluticasone propionate 50 mcg/actuation nasal spray,suspension  . meloxicam (MOBIC) 15 MG tablet TAKE 1 TABLET (15 MG TOTAL) BY MOUTH DAILY AS NEEDED (MODERATE BACK PAIN).  . Omega-3 Fatty Acids (FISH OIL) 1200 MG CAPS Take by mouth.  [name] aspirin EC 81 MG tablet Take 81 mg by mouth daily.  . furosemide (LASIX) 20 MG tablet Take 1 tablet (20 mg total) by mouth daily as needed for edema. For 1 week take 2 pills in morning then resume normal dose. (Patient not taking: Reported on 06/17/2018)   No current facility-administered medications on file prior to visit.     Review of Systems  Constitutional: Negative for activity change, appetite change, chills, diaphoresis, fatigue and fever.  HENT: Negative for congestion and hearing loss.   Eyes: Negative for visual disturbance.  Respiratory: Negative for cough, chest tightness, shortness of breath and wheezing.   Cardiovascular: Negative for chest pain, palpitations and leg swelling.  Gastrointestinal: Negative for abdominal pain, constipation, diarrhea, nausea and vomiting.  Genitourinary: Negative for dysuria, frequency and hematuria.  Musculoskeletal: Positive for arthralgias. Negative for neck pain.  Skin: Negative for rash.  Allergic/Immunologic: Negative for environmental allergies.  Neurological: Positive for tremors and weakness. Negative for dizziness, light-headedness, numbness and headaches.       Memory loss  Hematological: Negative for adenopathy.  Psychiatric/Behavioral: Negative for behavioral problems, dysphoric mood and sleep disturbance.   Per HPI unless specifically indicated above     Objective:    BP (!) 163/80 (BP Location: Left Arm, Cuff Size: Normal)   Pulse 69   Temp 98.1 F (36.7 C) (Oral)   Resp  16   Ht 6' (1.829 m)   Wt 242 lb (109.8 kg)   BMI 32.82 kg/m   Wt Readings from Last 3 Encounters:  06/17/18 242 lb (109.8 kg)  02/24/18 237 lb 9.6 oz (107.8 kg)  02/24/18 237 lb 9.6 oz (107.8 kg)    Physical Exam Vitals signs and nursing note reviewed.  Constitutional:      General: He is not in acute distress.    Appearance: He is well-developed. He is not diaphoretic.     Comments: Chronically ill but still Well-appearing, comfortable, cooperative  HENT:     Head: Normocephalic and atraumatic.  Eyes:     General:        Right  eye: No discharge.        Left eye: No discharge.     Conjunctiva/sclera: Conjunctivae normal.     Pupils: Pupils are equal, round, and reactive to light.  Neck:     Musculoskeletal: Normal range of motion and neck supple.     Thyroid: No thyromegaly.  Cardiovascular:     Rate and Rhythm: Normal rate and regular rhythm.     Heart sounds: Normal heart sounds. No murmur.  Pulmonary:     Effort: Pulmonary effort is normal. No respiratory distress.     Breath sounds: Normal breath sounds. No wheezing or rales.  Abdominal:     General: Bowel sounds are normal. There is no distension.     Palpations: Abdomen is soft. There is no mass.     Tenderness: There is no abdominal tenderness.  Musculoskeletal: Normal range of motion.        General: No tenderness.     Comments: Uses cane for ambulation  Lymphadenopathy:     Cervical: No cervical adenopathy.  Skin:    General: Skin is warm and dry.     Findings: No erythema or rash.  Neurological:     Mental Status: He is alert and oriented to person, place, and time.     Comments: Distal sensation intact to light touch all extremities  Psychiatric:        Behavior: Behavior normal.     Comments: Well groomed, good eye contact, normal speech and thoughts        Results for orders placed or performed in visit on 06/10/18  Lipid panel  Result Value Ref Range   Cholesterol 176 <200 mg/dL   HDL 35 (L)  > OR = 40 mg/dL   Triglycerides 98 <150 mg/dL   LDL Cholesterol (Calc) 121 (H) mg/dL (calc)   Total CHOL/HDL Ratio 5.0 (H) <5.0 (calc)   Non-HDL Cholesterol (Calc) 141 (H) <130 mg/dL (calc)  COMPLETE METABOLIC PANEL WITH GFR  Result Value Ref Range   Glucose, Bld 102 (H) 65 - 99 mg/dL   BUN 28 (H) 7 - 25 mg/dL   Creat 1.07 0.70 - 1.11 mg/dL   GFR, Est Non African American 64 > OR = 60 mL/min/1.76m2   GFR, Est African American 75 > OR = 60 mL/min/1.74m2   BUN/Creatinine Ratio 26 (H) 6 - 22 (calc)   Sodium 141 135 - 146 mmol/L   Potassium 3.8 3.5 - 5.3 mmol/L   Chloride 104 98 - 110 mmol/L   CO2 30 20 - 32 mmol/L   Calcium 8.6 8.6 - 10.3 mg/dL   Total Protein 6.0 (L) 6.1 - 8.1 g/dL   Albumin 3.7 3.6 - 5.1 g/dL   Globulin 2.3 1.9 - 3.7 g/dL (calc)   AG Ratio 1.6 1.0 - 2.5 (calc)   Total Bilirubin 0.9 0.2 - 1.2 mg/dL   Alkaline phosphatase (APISO) 67 35 - 144 U/L   AST 15 10 - 35 U/L   ALT 8 (L) 9 - 46 U/L  CBC with Differential/Platelet  Result Value Ref Range   WBC 6.1 3.8 - 10.8 Thousand/uL   RBC 4.04 (L) 4.20 - 5.80 Million/uL   Hemoglobin 12.1 (L) 13.2 - 17.1 g/dL   HCT 36.9 (L) 38.5 - 50.0 %   MCV 91.3 80.0 - 100.0 fL   MCH 30.0 27.0 - 33.0 pg   MCHC 32.8 32.0 - 36.0 g/dL   RDW 13.8 11.0 - 15.0 %   Platelets 208 140 - 400  Thousand/uL   MPV 10.7 7.5 - 12.5 fL   Neutro Abs 3,910 1,500 - 7,800 cells/uL   Lymphs Abs 1,403 850 - 3,900 cells/uL   Absolute Monocytes 555 200 - 950 cells/uL   Eosinophils Absolute 183 15 - 500 cells/uL   Basophils Absolute 49 0 - 200 cells/uL   Neutrophils Relative % 64.1 %   Total Lymphocyte 23.0 %   Monocytes Relative 9.1 %   Eosinophils Relative 3.0 %   Basophils Relative 0.8 %  Hemoglobin A1c  Result Value Ref Range   Hgb A1c MFr Bld 5.4 <5.7 % of total Hgb   Mean Plasma Glucose 108 (calc)   eAG (mmol/L) 6.0 (calc)  PSA  Result Value Ref Range   PSA 2.7 < OR = 4.0 ng/mL      Assessment & Plan:   Problem List Items Addressed  This Visit    Hypertension   Osteoarthritis of multiple joints    Other Visit Diagnoses    Annual physical exam    -  Primary   Memory loss       Relevant Orders   Ambulatory referral to Neurology   Ambulatory referral to Chronic Care Management Services   Recurrent falls       Relevant Orders   Ambulatory referral to Neurology   Ambulatory referral to Chronic Care Management Services   Syncope, unspecified syncope type       Relevant Orders   Ambulatory referral to Neurology   Occasional tremors       Relevant Orders   Ambulatory referral to Neurology   Foot drop, bilateral       Relevant Orders   Ambulatory referral to Neurology      No orders of the defined types were placed in this encounter.  Updated Health Maintenance information Reviewed recent lab results with patient Encouraged improvement to lifestyle with diet and exercise   Referral to [facility] Neurology for evaluation of variety of worsening symptoms worsening functional memory loss, bilateral foot drop vs shuffling gait, right upper extremity muscle jerks, rare episodes of syncope.   Referral to CCM nurse case manager and social work for assistance with gradual progressive decline in health, with worsening functional symptoms now with generalized weakness, some recurrent falls, gait abnormality with foot drop, memory loss, already referred to neurology for consultation on some weakness, tremors and memory - may need additional support at home now with declining health. Currently mostly family support, may warrant home health in future.  Orders Placed This Encounter  Procedures  . Ambulatory referral to Neurology    Referral Priority:   Routine    Referral Type:   Consultation    Referral Reason:   Specialty Services Required    Requested Specialty:   Neurology    Number of Visits Requested:   1  . Ambulatory referral to Chronic Care Management Services    Referral Priority:   Routine    Referral  Type:   Consultation    Referral Reason:   Care Coordination    Number of Visits Requested:   1     Follow up plan: Return in about 3 months (around 09/17/2018) for 3 months HTN, Memory / Neuro f/u, Back Orthopedic.  [name], [facility] 06/17/2018, 2:14 PM

## 2018-06-17 NOTE — Patient Instructions (Addendum)
Thank you for coming to the office today.  Referral to [facility] Neurology for evaluation of variety of worsening symptoms worsening functional memory loss, bilateral foot drop vs shuffling gait, right upper extremity muscle jerks, rare episodes of syncope.  [facility] - Neurology Dept [address] [place], [place] [address] Phone: [phone]  ----- Will refer to our Nurse Case Management Team / Social worker - to evaluate and check in determine if any additional needs at home or in future. Merlene Morse and Brooke.  Referred to Orthopedic surgeon back in May - should have had appointment by now for Back Pain  [facility] ([facility] Address: [address], [place],  [address] Hours:  9AM-5PM Phone: [phone]  Please schedule a Follow-up Appointment to: Return in about 3 months (around 09/17/2018) for 3 months HTN, Memory / Neuro f/u, Back Orthopedic.  If you have any other questions or concerns, please feel free to call the office or send a message through [place]. You may also schedule an earlier appointment if necessary.  Additionally, you may be receiving a survey about your experience at our office within a few days to 1 week by e-mail or mail. We value your feedback.  [name], DO [place]

## 2018-06-24 ENCOUNTER — Ambulatory Visit: Payer: Self-pay | Admitting: Licensed Clinical Social Worker

## 2018-06-24 DIAGNOSIS — R413 Other amnesia: Secondary | ICD-10-CM

## 2018-06-24 NOTE — Chronic Care Management (AMB) (Signed)
  Chronic Care Management    Clinical Social Work General Note  06/24/2018 Name: Randy [name] MRN: [id number] DOB: [birth year]  Randy [name] is a 84 y.o. year old male who is a primary care patient of [name], DO. The CCM was consulted to assist the patient with Level of Care Concerns.   Randy [name] was given information about Chronic Care Management services today including:  1. CCM service includes personalized support from designated clinical staff supervised by his physician, including individualized plan of care and coordination with other care providers 2. 24/7 contact phone numbers for assistance for urgent and routine care needs. 3. Service will only be billed when office clinical staff spend 20 minutes or more in a month to coordinate care. 4. Only one practitioner may furnish and bill the service in a calendar month. 5. The patient may stop CCM services at any time (effective at the end of the month) by phone call to the office staff. 6. The patient will be responsible for cost sharing (co-pay) of up to 20% of the service fee (after annual deductible is met).  Patient agreed to services and verbal consent obtained.   Review of patient status, including review of consultants reports, relevant laboratory and other test results, and collaboration with appropriate care team members and the patient's provider was performed as part of comprehensive patient evaluation and provision of chronic care management services.    Goals Addressed    . "I'm having a hard time managing my health" (pt-stated)       Current Barriers:  . Financial constraints . Level of care concerns . ADL IADL limitations . Social Isolation . Limited access to caregiver . Memory Deficits . Lacks knowledge of community resource: personal care service resources within the area that may benefit patient  Clinical Social Work Clinical Goal(s):  [name] Over the next 90 days, client will work with SW to address  concerns related to lack of care/support within the home  Interventions: . Patient interviewed and appropriate assessments performed . Provided patient with information about personal care service resources within the area. Patient reports that he lives with hiis spouse and daughter and that his daughter is his main support and assist him with transportation/meal prep/daily needs. Patient reports that he is working with an agency (unable to state the name of this program) that will look into installing an elevator in his home in order for patient to avoid the 7 steps from the garage into the house.  . Discussed plans with patient for ongoing care management follow up and provided patient with direct contact information for care management team . Advised patient to contact CCM team with any urgent concerns . Assisted patient/caregiver with obtaining information about health plan benefits . Provided education and assistance to client regarding Advanced Directives. . Provided education to patient/caregiver regarding level of care options.  Patient Self Care Activities:  . Attends all scheduled provider appointments . Calls provider office for new concerns or questions  Initial goal documentation   Follow Up Plan: SW will follow up with patient by phone over the next 30 days     Eula Fried, [employer], MSW, [email or web address] Phone: [phone]

## 2018-06-26 ENCOUNTER — Other Ambulatory Visit (INDEPENDENT_AMBULATORY_CARE_PROVIDER_SITE_OTHER): Payer: Medicare Other | Admitting: Family Medicine

## 2018-06-26 DIAGNOSIS — I10 Essential (primary) hypertension: Secondary | ICD-10-CM | POA: Diagnosis not present

## 2018-07-05 ENCOUNTER — Other Ambulatory Visit: Payer: Self-pay | Admitting: Family Medicine

## 2018-07-05 DIAGNOSIS — M1712 Unilateral primary osteoarthritis, left knee: Secondary | ICD-10-CM

## 2018-07-05 DIAGNOSIS — M25562 Pain in left knee: Secondary | ICD-10-CM

## 2018-07-05 DIAGNOSIS — G8929 Other chronic pain: Secondary | ICD-10-CM

## 2018-07-13 ENCOUNTER — Ambulatory Visit: Payer: Self-pay | Admitting: *Deleted

## 2018-07-13 DIAGNOSIS — R413 Other amnesia: Secondary | ICD-10-CM

## 2018-07-13 DIAGNOSIS — R296 Repeated falls: Secondary | ICD-10-CM

## 2018-07-13 NOTE — Patient Instructions (Signed)
Thank you allowing the Chronic Care Management Team to be a part of your care! It was a pleasure speaking with you today!   CCM (Chronic Care Management) Team   [place] Churchill RN, BSN Nurse Care Coordinator  [phone]  [name] PharmD  Clinical Pharmacist  [phone]  Eula Fried LCSW Clinical Social Worker [phone]  Goals Addressed            This Visit's Progress   . Dad's memory is failing and he is falling (pt-stated)       Current Barriers:  [name] Knowledge Deficits related to falls prevention . Cognitive Deficits  Nurse Case Manager Clinical Goal(s):  [name] Over the next 90 days, patient will work with [facility]  to address needs related to falls prevention and memory improvement  Interventions:  . Provided education to patient re: My Chart sign up assisting family with letting providers know patient's ongoing symptoms with patient's ongoing memory issues  . Reviewed medications with patient and discussed daughter's concern of patient stating he gets dizzy when standing up at times . Collaborated with SW  regarding patient's current needs . Discussed plans with patient for ongoing care management follow up and provided patient with direct contact information for care management team . Reviewed scheduled/upcoming provider appointments including: Orthopedic appt July 23 and Neurology appt July 22.  [name] Pharmacy referral for patient reports of dizziness when standing/possible medication s/e . Discussed with daughter patient's changing memory issues, him being unable to "find his words", shuffling his feet when walking, increased falls and not always compliant with medications. Discussed with daughter the possible benefits of HH PT/OT. During discussion with daughter noted patient has Ortho appointment this month for back and knee pain, decision was made to hold off on [facility] PT/OT referral until after this appointment. Plan to touch base with daughter after ortho and neuro  appointments.  Patient Self Care Activities:  . Currently UNABLE TO independently prevent falls  . Attends all scheduled provider appointments . Performs ADL's independently  Initial goal documentation        The patient verbalized understanding of instructions provided today and declined a print copy of patient instruction materials.   The patient has been provided with contact information for the care management team and has been advised to call with any health related questions or concerns.

## 2018-07-13 NOTE — Chronic Care Management (AMB) (Signed)
  Chronic Care Management   Follow Up Note   07/13/2018 Name: Lucille Crichlow MRN: [id number] DOB: 10/04/1934  Referred by: [name], DO Reason for referral : No chief complaint on file.   Izacc Demeyer is a 84 y.o. year old male who is a primary care patient of [name], DO. The CCM team was consulted for assistance with chronic disease management and care coordination needs.    Review of patient status, including review of consultants reports, relevant laboratory and other test results, and collaboration with appropriate care team members and the patient's provider was performed as part of comprehensive patient evaluation and provision of chronic care management services.    Called and spoke with patient's daughter [name], who lives in the home. Discussed HH PT/OT but decided to hold off until the patient sees the orthopedic MD for his back pain.   Goals Addressed            This Visit's Progress   . Dad's memory is failing and he is falling (pt-stated)       Current Barriers:  [name] Knowledge Deficits related to falls prevention . Cognitive Deficits  Nurse Case Manager Clinical Goal(s):  [name] Over the next 90 days, patient will work with [facility]  to address needs related to falls prevention and memory improvement  Interventions:  . Provided education to patient re: My Chart sign up assisting family with letting providers know patient's ongoing symptoms with patient's ongoing memory issues  . Reviewed medications with patient and discussed daughter's concern of patient stating he gets dizzy when standing up at times . Collaborated with SW  regarding patient's current needs . Discussed plans with patient for ongoing care management follow up and provided patient with direct contact information for care management team . Reviewed scheduled/upcoming provider appointments including: Orthopedic appt July 23 and Neurology appt July 22.  [name] Pharmacy referral for patient  reports of dizziness when standing/possible medication s/e . Discussed with daughter patient's changing memory issues, him being unable to "find his words", shuffling his feet when walking, increased falls and not always compliant with medications. Discussed with daughter the possible benefits of HH PT/OT. During discussion with daughter noted patient has Ortho appointment this month for back and knee pain, decision was made to hold off on [facility] PT/OT referral until after this appointment. Plan to touch base with daughter after ortho and neuro appointments.  Patient Self Care Activities:  . Currently UNABLE TO independently prevent falls  . Attends all scheduled provider appointments . Performs ADL's independently  Initial goal documentation         The care management team will reach out to the patient again over the next 30 days.  The patient has been provided with contact information for the care management team and has been advised to call with any health related questions or concerns.    Merlene Morse Elin Seats RN, BSN Nurse Case Pharmacist, [facility]  [phone]) [employer]

## 2018-07-14 ENCOUNTER — Ambulatory Visit: Payer: Self-pay | Admitting: Licensed Clinical Social Worker

## 2018-07-14 NOTE — Chronic Care Management (AMB) (Signed)
°  Chronic Care Management    Clinical Social Work Follow Up Note  07/14/2018 Name: Randy [name] MRN: [id number] DOB: [birth year]  Randy [name] is a 84 y.o. year old male who is a primary care patient of [name], DO. The CCM team was consulted for assistance with [employer] and Level of Care Concerns.   Review of patient status, including review of consultants reports, other relevant assessments, and collaboration with appropriate care team members and the patient's provider was performed as part of comprehensive patient evaluation and provision of chronic care management services.     Goals Addressed     "I'm having a hard time managing my health" (pt-stated)       Current Barriers:   Financial constraints  Level of care concerns  ADL IADL limitations  Social Isolation  Limited access to caregiver  Memory Deficits  Lacks knowledge of community resource: personal care service resources within the area that may benefit patient  Clinical Social Work Clinical Goal(s):   Over the next 90 days, client will work with SW to address concerns related to lack of care/support within the home  Interventions:  Patient interviewed and appropriate assessments performed  Provided patient with information about personal care service resources within the area. Patient reports that he lives with hiis spouse and daughter and that his daughter is his main support and assist him with transportation/meal prep/daily needs. Patient reports that he is working with an agency (unable to state the name of this program) that will look into installing an elevator in his home in order for patient to avoid the 7 steps from the garage into the house. UPDATE* Patient reports that he is UNABLE to afford the elevator after receiving estimate. Patient shares that agency will work on a more affordable option for safety by "getting rid of as many steps that they can or at least decrease the ones that  they can't to a lesser height" to prevent future falls. Patient denies wanting to look into gaining a wheelchair ramp as he states this will not resolve the issue of the steps that he has to use daily.   Discussed plans with patient for ongoing care management follow up and provided patient with direct contact information for care management team  Advised patient to contact CCM team with any urgent concerns  Assisted patient/caregiver with obtaining information about health plan benefits  Provided education and assistance to client regarding Advanced Directives.  Provided education to patient/caregiver regarding level of care options.  LCSW provided self-care education. Patient has ongoing issue with memory. Patient was encouraged to take medications as prescribed and to ask for help (daughter or spouse) when needed and to try to check blood pressure and record it once a day per PCP's recommendation.   Patient Self Care Activities:   Attends all scheduled provider appointments  Calls provider office for new concerns or questions  Please see past updates related to this goal by clicking on the "Past Updates" button in the selected goal    Follow Up Plan: SW will follow up with patient by phone over the next 30 days  Eula Fried, [place], MSW, [email or web address] Phone: [phone]

## 2018-07-16 ENCOUNTER — Telehealth: Payer: Self-pay

## 2018-07-23 ENCOUNTER — Ambulatory Visit: Payer: Self-pay | Admitting: Pharmacist

## 2018-07-23 DIAGNOSIS — I10 Essential (primary) hypertension: Secondary | ICD-10-CM

## 2018-07-23 NOTE — Patient Instructions (Signed)
Thank you allowing the Chronic Care Management Team to be a part of your care! It was a pleasure speaking with you today!     CCM (Chronic Care Management) Team    Janci Minor RN, BSN Nurse Care Coordinator  [phone]   [name] PharmD  Clinical Pharmacist  [phone]   Eula Fried LCSW Clinical Social Worker [phone]  Visit Information  Goals Addressed            This Visit's Progress   . Medication Management       Current Barriers:  . Cognitive Deficits  Pharmacist Clinical Goal(s):  [name] Over the next 30 days, patient/caregiver will work with CM Pharmacist to address needs related to medication management and blood pressure mangement  Interventions: . Schedule appointment with patient's daughter, [name], to complete medication review  Patient Self Care Activities:  . Attends all scheduled provider appointments o Appointment with Neurology scheduled for 7/22 . Calls pharmacy for medication refills . Calls provider office for new concerns or questions  Initial goal documentation        The patient verbalized understanding of instructions provided today and declined a print copy of patient instruction materials.   Telephone follow up appointment with care management team member scheduled for: 7/27  [name], PharmD, [facility] [phone]

## 2018-07-23 NOTE — Chronic Care Management (AMB) (Signed)
  Chronic Care Management   Follow Up Note   07/23/2018 Name: Oneil Behney MRN: [id number] DOB: 1934-02-23  Referred by: [name], DO Reason for referral : No chief complaint on file.   Isaic Syler is a 84 y.o. year old male who is a primary care patient of [name], DO. The CCM team was consulted for assistance with chronic disease management and care coordination needs.    Referral received from [facility] Nurse Case Manager for medication review and medication adherence.  I reached out to patient's daughter, [name], by phone today. [name] listed on patient's designated party release in chart.  Review of patient status, including review of consultants reports, relevant laboratory and other test results, and collaboration with appropriate care team members and the patient's provider was performed as part of comprehensive patient evaluation and provision of chronic care management services.    Goals Addressed            This Visit's Progress   . Medication Management       Current Barriers:  . Cognitive Deficits  Pharmacist Clinical Goal(s):  [name] Over the next 30 days, patient/caregiver will work with CM Pharmacist to address needs related to medication management and blood pressure mangement  Interventions: . Schedule appointment with patient's daughter, [name], to complete medication review  Patient Self Care Activities:  . Attends all scheduled provider appointments o Appointment with Neurology scheduled for 7/22 . Calls pharmacy for medication refills . Calls provider office for new concerns or questions  Initial goal documentation        Plan  Telephone follow up appointment with care management team member scheduled for: 7/27 at 1 pm  [name], PharmD, [place] [phone]

## 2018-07-29 ENCOUNTER — Other Ambulatory Visit: Payer: Self-pay | Admitting: Neurology

## 2018-07-29 DIAGNOSIS — R413 Other amnesia: Secondary | ICD-10-CM

## 2018-08-03 ENCOUNTER — Telehealth: Payer: Self-pay

## 2018-08-06 ENCOUNTER — Ambulatory Visit: Payer: Medicare Other | Admitting: Pharmacist

## 2018-08-06 DIAGNOSIS — M791 Myalgia, unspecified site: Secondary | ICD-10-CM

## 2018-08-06 DIAGNOSIS — I10 Essential (primary) hypertension: Secondary | ICD-10-CM

## 2018-08-06 DIAGNOSIS — T466X5A Adverse effect of antihyperlipidemic and antiarteriosclerotic drugs, initial encounter: Secondary | ICD-10-CM

## 2018-08-07 NOTE — Patient Instructions (Signed)
Thank you allowing the Chronic Care Management Team to be a part of your care! It was a pleasure speaking with you today!     CCM (Chronic Care Management) Team    Janci Minor RN, BSN Nurse Care Coordinator  [phone]   [name] PharmD  Clinical Pharmacist  [phone]   Eula Fried LCSW Clinical Social Worker [phone]  Visit Information  Goals Addressed            This Visit's Progress   . Medication Management       Current Barriers:  . Cognitive Deficits  Pharmacist Clinical Goal(s):  Randy [name] Over the next 30 days, patient/caregiver will work with CM Pharmacist to address needs related to medication management and blood pressure mangement  Interventions: . Complete comprehensive medication review with [name] and his wife o Reports that he has been independently managing his own medications o Reports that he has not been taking aspirin 81 mg, but is not sure why or when he stopped taking it - Randy [name] notes that patient "stays bruised up", bruises easily o Reports that he is continuing to take CoQ10 supplement. Per chart, patient originally started on this supplement related to muscle pain with statin. Continuing to take although no longer on statin therapy.  - Per PCP note in chart from 02/24/18, patient self discontinued rosuvastatin due to statin induced myalgias multiple muscles, he tried 3 times week could not tolerate low dose o Reports not currently taking furosemide. Per chart, patient has been taking this on an as needed basis in the past, but reports now not taking at all.  - Regarding patient's leg swelling, Randy [name] states "they're kind of swelled up, but not like when he started on it" . Review patient's recent blood pressure readings (see below) and encourage patient to continue monitoring o Confirms using an upper arm monitor . Counsel on importance of medication adherence. Patient reports currently using a weekly pillbox that has one  slot/day of the week.  o Counsel patient to obtain a three times/day weekly pillbox as he is now taking medication three times daily. o Denies any missed doses . Discuss patient's medication management and medication adherence with patient's daughter, Randy [name], who also lives with the patient o Counsel daughter on importance of patient's medication adherence and regarding tools to aid with his adherence, including a three times/day weekly pillbox that she fills with the patient, use of medication reminder alarm, etc - Randy [name] reports that she will pick up this new weekly pillbox today after work and aid patient with filling it this evening. . Will follow up with PCP regarding: patient's blood pressure readings and findings from medication review . Will collaborate with CM Nurse Case Manager - Randy [name] expresses interest in talking with her again regarding Randy [name] PT  Patient Self Care Activities:  . Patient to take medications as directed with assistance of daughter Randy [name] Randy [name] reports that she will pick up a three times/day weekly pillbox and aid patient with filling it this evening . Attends all scheduled provider appointments o Appointment with Neurology scheduled for 7/22 . Calls pharmacy for medication refills . Calls provider office for new concerns or questions . Checks blood pressure daily and keeps log Date Blood Pressure  23 - July 148/96, HR 75  24 - July 158/83, HR 73  25 - July -  26 - July 153/93, HR 76  27 - July -  28 - July 151/80, HR 78  29 - July  159/81, HR 74  30 - July -    Please see past updates related to this goal by clicking on the "Past Updates" button in the selected goal         The patient verbalized understanding of instructions provided today and declined a print copy of patient instruction materials.   The care management team will reach out to the patient again over the next 7 days.   [name], PharmD, [place]  [employer] [phone]

## 2018-08-07 NOTE — Chronic Care Management (AMB) (Signed)
Chronic Care Management   Follow Up Note   08/07/2018 Name: Randy [name] MRN: [id number] DOB: [birth year]  Referred by: [name], DO Reason for referral : Chronic Care Management (Patient Phone Call)   Randy [name] is a 84 y.o. year old male who is a primary care patient of [name], DO. The CCM team was consulted for assistance with chronic disease management and care coordination needs. Randy [name] has a past medical history including, but not limited to: hypertension, osteoarthritis, hyperlipidemia and memory loss.  I reached out to Randy [name] by phone today. Speak with patient and his wife. Also place call to patient's daughter, Randy [name].  Review of patient status, including review of consultants reports, relevant laboratory and other test results, and collaboration with appropriate care team members and the patient's provider was performed as part of comprehensive patient evaluation and provision of chronic care management services.    Goals Addressed            This Visit's Progress   . Medication Management       Current Barriers:  . Cognitive Deficits  Pharmacist Clinical Goal(s):  [name] Over the next 30 days, patient/caregiver will work with CM Pharmacist to address needs related to medication management and blood pressure mangement  Interventions: . Complete comprehensive medication review with Mr. Garriga and his wife o Reports that he has been independently managing his own medications o Reports that he has not been taking aspirin 81 mg, but is not sure why or when he stopped taking it - Randy [name] notes that patient "stays bruised up", bruises easily o Reports that he is continuing to take CoQ10 supplement. Per chart, patient originally started on this supplement related to muscle pain with statin. Continuing to take although no longer on statin therapy.  - Per PCP note in chart from 02/24/18, patient self discontinued rosuvastatin due to statin induced  myalgias multiple muscles, he tried 3 times week could not tolerate low dose o Reports not currently taking furosemide. Per chart, patient has been taking this on an as needed basis in the past, but reports now not taking at all.  - Regarding patient's leg swelling, Randy [name] states "they're kind of swelled up, but not like when he started on it" . Review patient's recent blood pressure readings (see below) and encourage patient to continue monitoring o Confirms using an upper arm monitor . Counsel on importance of medication adherence. Patient reports currently using a weekly pillbox that has one slot/day of the week.  o Counsel patient to obtain a three times/day weekly pillbox as he is now taking medication three times daily. o Denies any missed doses . Discuss patient's medication management and medication adherence with patient's daughter, Randy [name], who also lives with the patient o Per Neurology visit on 7/22, Dr. Manuella Ghazi assessed patient to have memory loss likely from neurodegenerative disorder. Provider advised that "family should look over his medications" o Randy [name] reports that based on this visit, she had decided to start being more involved in patient's medication management o Counsel daughter on importance of patient's medication adherence and regarding tools to aid with his adherence, including a three times/day weekly pillbox that she fills with the patient, use of medication reminder alarm, etc - Randy [name] reports that she will pick up this new weekly pillbox today after work and aid patient with filling it this evening. . Will follow up with PCP regarding: patient's blood pressure readings and findings from medication review . Will collaborate  with CM Nurse Case Manager - Randy [name] expresses interest in talking with her again regarding Randy [name] PT  Patient Self Care Activities:  . Patient to take medications as directed with assistance of daughter Randy [name] Randy [name] reports that she will pick up a three  times/day weekly pillbox and aid patient with filling it this evening . Attends all scheduled provider appointments o Appointment with Neurology scheduled for 7/22 . Calls pharmacy for medication refills . Calls provider office for new concerns or questions . Checks blood pressure daily and keeps log Date Blood Pressure  23 - July 148/96, HR 75  24 - July 158/83, HR 73  25 - July -  26 - July 153/93, HR 76  27 - July -  28 - July 151/80, HR 78  29 - July 159/81, HR 74  30 - July -    Please see past updates related to this goal by clicking on the "Past Updates" button in the selected goal         Plan  The care management team will reach out to the patient again over the next 7 days.   [name], PharmD, [facility] [employer] [phone]

## 2018-08-10 ENCOUNTER — Ambulatory Visit: Payer: Self-pay | Admitting: *Deleted

## 2018-08-10 DIAGNOSIS — R296 Repeated falls: Secondary | ICD-10-CM

## 2018-08-10 DIAGNOSIS — R413 Other amnesia: Secondary | ICD-10-CM

## 2018-08-10 NOTE — Chronic Care Management (AMB) (Signed)
  Chronic Care Management   Follow Up Note   08/10/2018 Name: Damarrion Mimbs MRN: [id number] DOB: 01-Mar-1934  Referred by: [name], DO Reason for referral : Chronic Care Management (F/U on Falls and PT)   Dereon Corkery is a 84 y.o. year old male who is a primary care patient of [name], DO. The CCM team was consulted for assistance with chronic disease management and care coordination needs.    Review of patient status, including review of consultants reports, relevant laboratory and other test results, and collaboration with appropriate care team members and the patient's provider was performed as part of comprehensive patient evaluation and provision of chronic care management services.    Goals Addressed            This Visit's Progress   . Dad's memory is failing and he is falling (pt-stated)       Current Barriers:  [name] Knowledge Deficits related to falls prevention . Cognitive Deficits  Nurse Case Manager Clinical Goal(s):  [name] Over the next 90 days, patient will work with [facility]  to address needs related to falls prevention and memory improvement  Interventions:  . Reviewed with spouse patient's recent neuro and ortho appointments . Spouse reports patient is scheduled to begin PT this week in [place] . Spouse stated neurologist started some new medications but she has not seen any change in the patient since starting the new medications . Discussed with spouse the catalog provided with [facility] plan members to assist in purchasing incontinence supplies for the patient. . Talked with spouse how care giving was going in the home and asked if she or her daughter needed further resources, she stated not at this time.  . Reviewed with spouse role of the CCM team, and as patient's memory progresses please reach out for resources   Patient Self Care Activities:  . Currently UNABLE TO independently prevent falls  . Attends all scheduled provider appointments .  Performs ADL's independently  Initial goal documentation         The care management team will reach out to the patient again over the next 30 days.  The patient has been provided with contact information for the care management team and has been advised to call with any health related questions or concerns.   Merlene Morse Deola Rewis RN, BSN Nurse Case Pharmacist, [facility]  [phone]) [employer]

## 2018-08-10 NOTE — Patient Instructions (Signed)
Thank you allowing the Chronic Care Management Team to be a part of your care! It was a pleasure speaking with you today!  CCM (Chronic Care Management) Team   Venetta Knee RN, BSN Nurse Care Coordinator  [phone]  [name] PharmD  Clinical Pharmacist  [phone]  Eula Fried LCSW Clinical Social Worker [phone]   Fall Prevention in the Home, Adult Falls can cause injuries. They can happen to people of all ages. There are many things you can do to make your home safe and to help prevent falls. Ask for help when making these changes, if needed. What actions can I take to prevent falls? General Instructions  Use good lighting in all rooms. Replace any light bulbs that burn out.  Turn on the lights when you go into a dark area. Use night-lights.  Keep items that you use often in easy-to-reach places. Lower the shelves around your home if necessary.  Set up your furniture so you have a clear path. Avoid moving your furniture around.  Do not have throw rugs and other things on the floor that can make you trip.  Avoid walking on wet floors.  If any of your floors are uneven, fix them.  Add color or contrast paint or tape to clearly mark and help you see: ? Any grab bars or handrails. ? First and last steps of stairways. ? Where the edge of each step is.  If you use a stepladder: ? Make sure that it is fully opened. Do not climb a closed stepladder. ? Make sure that both sides of the stepladder are locked into place. ? Ask someone to hold the stepladder for you while you use it.  If there are any pets around you, be aware of where they are. What can I do in the bathroom?      Keep the floor dry. Clean up any water that spills onto the floor as soon as it happens.  Remove soap buildup in the tub or shower regularly.  Use non-skid mats or decals on the floor of the tub or shower.  Attach bath mats securely with double-sided, non-slip rug tape.   If you need to sit down in the shower, use a plastic, non-slip stool.  Install grab bars by the toilet and in the tub and shower. Do not use towel bars as grab bars. What can I do in the bedroom?  Make sure that you have a light by your bed that is easy to reach.  Do not use any sheets or blankets that are too big for your bed. They should not hang down onto the floor.  Have a firm chair that has side arms. You can use this for support while you get dressed. What can I do in the kitchen?  Clean up any spills right away.  If you need to reach something above you, use a strong step stool that has a grab bar.  Keep electrical cords out of the way.  Do not use floor polish or wax that makes floors slippery. If you must use wax, use non-skid floor wax. What can I do with my stairs?  Do not leave any items on the stairs.  Make sure that you have a light switch at the top of the stairs and the bottom of the stairs. If you do not have them, ask someone to add them for you.  Make sure that there are handrails on both sides of the stairs, and use them.  Fix handrails that are broken or loose. Make sure that handrails are as long as the stairways.  Install non-slip stair treads on all stairs in your home.  Avoid having throw rugs at the top or bottom of the stairs. If you do have throw rugs, attach them to the floor with carpet tape.  Choose a carpet that does not hide the edge of the steps on the stairway.  Check any carpeting to make sure that it is firmly attached to the stairs. Fix any carpet that is loose or worn. What can I do on the outside of my home?  Use bright outdoor lighting.  Regularly fix the edges of walkways and driveways and fix any cracks.  Remove anything that might make you trip as you walk through a door, such as a raised step or threshold.  Trim any bushes or trees on the path to your home.  Regularly check to see if handrails are loose or broken. Make sure  that both sides of any steps have handrails.  Install guardrails along the edges of any raised decks and porches.  Clear walking paths of anything that might make someone trip, such as tools or rocks.  Have any leaves, snow, or ice cleared regularly.  Use sand or salt on walking paths during winter.  Clean up any spills in your garage right away. This includes grease or oil spills. What other actions can I take?  Wear shoes that: ? Have a low heel. Do not wear high heels. ? Have rubber bottoms. ? Are comfortable and fit you well. ? Are closed at the toe. Do not wear open-toe sandals.  Use tools that help you move around (mobility aids) if they are needed. These include: ? Canes. ? Walkers. ? Scooters. ? Crutches.  Review your medicines with your doctor. Some medicines can make you feel dizzy. This can increase your chance of falling. Ask your doctor what other things you can do to help prevent falls. Where to find more information  Centers for Disease Control and Prevention, STEADI: https://garcia.biz/  [employer] on Aging: BrainJudge.co.uk Contact a doctor if:  You are afraid of falling at home.  You feel weak, drowsy, or dizzy at home.  You fall at home. Summary  There are many simple things that you can do to make your home safe and to help prevent falls.  Ways to make your home safe include removing tripping hazards and installing grab bars in the bathroom.  Ask for help when making these changes in your home. This information is not intended to replace advice given to you by your health care provider. Make sure you discuss any questions you have with your health care provider. Document Released: 10/20/2008 Document Revised: 04/16/2018 Document Reviewed: 08/08/2016 Elsevier Patient Education  2020 [employer].

## 2018-08-12 ENCOUNTER — Ambulatory Visit (HOSPITAL_COMMUNITY)
Admission: RE | Admit: 2018-08-12 | Discharge: 2018-08-12 | Disposition: A | Discharge status: Home / Self Care | Payer: Medicare Other | Source: Ambulatory Visit | Attending: Neurology | Admitting: Neurology

## 2018-08-12 ENCOUNTER — Other Ambulatory Visit: Payer: Self-pay

## 2018-08-12 DIAGNOSIS — R413 Other amnesia: Secondary | ICD-10-CM | POA: Diagnosis present

## 2018-08-13 ENCOUNTER — Ambulatory Visit: Payer: Medicare Other | Admitting: Licensed Clinical Social Worker

## 2018-08-13 DIAGNOSIS — R413 Other amnesia: Secondary | ICD-10-CM

## 2018-08-13 NOTE — Chronic Care Management (AMB) (Signed)
Chronic Care Management    Clinical Social Work Follow Up Note  08/13/2018 Name: Randy [name] MRN: [id number] DOB: [birth year]  Randy [name] is a 84 y.o. year old male who is a primary care patient of [name], DO. The CCM team was consulted for assistance with Level of Care Concerns.   Review of patient status, including review of consultants reports, other relevant assessments, and collaboration with appropriate care team members and the patient's provider was performed as part of comprehensive patient evaluation and provision of chronic care management services.     Goals Addressed     "I'm having a hard time managing my health" (pt-stated)       Current Barriers:   Financial constraints  Level of care concerns  ADL IADL limitations  Social Isolation  Limited access to caregiver  Memory Deficits  Lacks knowledge of community resource: personal care service resources within the area that may benefit patient  Clinical Social Work Clinical Goal(s):   Over the next 90 days, client will work with SW to address concerns related to lack of care/support within the home  Interventions:  Patient interviewed and appropriate assessments performed  Provided patient with information about personal care service resources within the area. Patient reports that he lives with hiis spouse and daughter and that his daughter is his main support and assist him with transportation/meal prep/daily needs. Patient reports that he is working with an agency (unable to state the name of this program) that will look into installing an elevator in his home in order for patient to avoid the 7 steps from the garage into the house. UPDATE* Patient reports that he is UNABLE to afford the elevator after receiving estimate. Patient shares that agency will work on a more affordable option for safety by "getting rid of as many steps that they can or at least decrease the ones that they can't to a  lesser height" to prevent future falls. Patient denies wanting to look into gaining a wheelchair ramp as he states this will not resolve the issue of the steps that he has to use daily.   Discussed plans with patient for ongoing care management follow up and provided patient with direct contact information for care management team  Advised patient to contact CCM team with any urgent concerns  Assisted patient/caregiver with obtaining information about health plan benefits  Provided education and assistance to client regarding Advanced Directives.  Provided education to patient/caregiver regarding level of care options.  LCSW provided information on the OTC Catalog and Benefit Program through [facility]. LCSW completed three way call to [facility] and was informed that patient's ID is not showing up in their system. Patient was informed that he will need to contact his insurance (number on the back of his insurance card) to find out if he is eligible for program.   LCSW provided self-care education. Patient has ongoing issue with memory. Patient was encouraged to take medications as prescribed and to ask for help (daughter or spouse) when needed and to try to check blood pressure and record it once a day per PCP's recommendation.   Patient Self Care Activities:   Attends all scheduled provider appointments  Calls provider office for new concerns or questions  Please see past updates related to this goal by clicking on the "Past Updates" button in the selected goal      Follow Up Plan: SW will follow up with patient by phone over the next month  Eula Fried, BSW, MSW, [email or web address] Phone: [phone]

## 2018-08-14 ENCOUNTER — Ambulatory Visit: Payer: Medicare Other | Admitting: Pharmacist

## 2018-08-14 DIAGNOSIS — I10 Essential (primary) hypertension: Secondary | ICD-10-CM

## 2018-08-14 DIAGNOSIS — R413 Other amnesia: Secondary | ICD-10-CM

## 2018-08-14 NOTE — Chronic Care Management (AMB) (Signed)
Chronic Care Management   Follow Up Note   08/14/2018 Name: MCKENZIE TORUNO MRN: [id number] DOB: 04-08-1934  Referred by: [name], DO Reason for referral : Chronic Care Management (Patient and Caregiver Phone Calls)   BYRNE CAPEK is a 84 y.o. year old male who is a primary care patient of [name], DO. The CCM team was consulted for assistance with chronic disease management and care coordination needs.  Mr. Fread has a past medical history including, but not limited to: hypertension, osteoarthritis, hyperlipidemia and memory loss.  I reached out to [employer] by phone today. With permission from patient, allso place call to patient's daughter, [name].  Review of patient status, including review of consultants reports, relevant laboratory and other test results, and collaboration with appropriate care team members and the patient's provider was performed as part of comprehensive patient evaluation and provision of chronic care management services.    Goals Addressed            This Visit's Progress   . Medication Management       Current Barriers:  . Cognitive Deficits  Pharmacist Clinical Goal(s):  [name] Over the next 30 days, patient/caregiver will work with CM Pharmacist to address needs related to medication management and blood pressure mangement  Interventions: . Collaboration with PCP regarding medication review findings o Per PCP, Okay for patient to remain off of aspirin 81 mg at this time if unable to tolerate due to bruising, particularly given his arthritis/knees etc, high fall risk o PCP agrees with patient discontinuing CoQ10 supplement as he is no longer taking statin. . Follow up with Mr. Bertsch and his daughter [name] regarding patient's medication adherence o Confirm patient obtained a three times/day weekly pillbox. However, denies having started using this new weekly pillbox o [name] confirms that she has started to assist her father more  with his medication. Counsel on importance of using three times/day weekly pillbox as adherence aid now that patient is taking medication three times daily - [name] states that she will help with setting this up for patient today . Advise to discontinue the CoQ10 supplement as he is no longer taking statin. However, [place] understanding, but expresses concern that patient will start having muscle cramps again if discontinued. Counsel that supplement was prescribed to address muscle cramps as side effect from statin medication, but that patient is no longer on this medication. . Mr. Maceachern reports that he is unable to review his blood pressure log with me today as he is currently having work done on the floors in his house and is currently unable to access the room where he left his log o Encourage patient to continue to check blood pressure and keep log.  o Schedule follow up appointment to review these numbers  Patient Self Care Activities:  . Patient to take medications as directed with assistance of daughter [name] to aid patient with filling three times/day weekly pillbox this evening . Attends all scheduled provider appointments . Calls pharmacy for medication refills . Calls provider office for new concerns or questions . Checks blood pressure daily and keeps log   Please see past updates related to this goal by clicking on the "Past Updates" button in the selected goal         Plan  Telephone follow up appointment with care management team member scheduled for: 8/11 at 2:30 pm  [name], PharmD, [place] [phone]

## 2018-08-14 NOTE — Patient Instructions (Signed)
Thank you allowing the Chronic Care Management Team to be a part of your care! It was a pleasure speaking with you today!     CCM (Chronic Care Management) Team    Janci Minor RN, BSN Nurse Care Coordinator  [phone]   [name] PharmD  Clinical Pharmacist  [phone]   Eula Fried LCSW Clinical Social Worker [phone]  Visit Information  Goals Addressed            This Visit's Progress   . Medication Management       Current Barriers:  . Cognitive Deficits  Pharmacist Clinical Goal(s):  [name] Over the next 30 days, patient/caregiver will work with CM Pharmacist to address needs related to medication management and blood pressure mangement  Interventions: . Collaboration with PCP regarding medication review findings o Per PCP, Okay for patient to remain off of aspirin 81 mg at this time if unable to tolerate due to bruising, particularly given his arthritis/knees etc, high fall risk o PCP agrees with patient discontinuing CoQ10 supplement as he is no longer taking statin. . Follow up with [name] and his daughter Randy [name] regarding patient's medication adherence o Confirm patient obtained a three times/day weekly pillbox. However, denies having started using this new weekly pillbox o Randy [name] confirms that she has started to assist her father more with his medication. Counsel on importance of using three times/day weekly pillbox as adherence aid now that patient is taking medication three times daily - Randy [name] states that she will help with setting this up for patient today . Advise to discontinue the CoQ10 supplement as he is no longer taking statin. However, Randy [name] understanding, but expresses concern that patient will start having muscle cramps again if discontinued. Counsel that supplement was prescribed to address muscle cramps as side effect from statin medication, but that patient is no longer on this medication. . [name] reports that he is  unable to review his blood pressure log with me today as he is currently having work done on the floors in his house and is currently unable to access the room where he left his log o Encourage patient to continue to check blood pressure and keep log.  o Schedule follow up appointment to review these numbers  Patient Self Care Activities:  . Patient to take medications as directed with assistance of daughter Randy [name] to aid patient with filling three times/day weekly pillbox this evening . Attends all scheduled provider appointments . Calls pharmacy for medication refills . Calls provider office for new concerns or questions . Checks blood pressure daily and keeps log   Please see past updates related to this goal by clicking on the "Past Updates" button in the selected goal         The patient verbalized understanding of instructions provided today and declined a print copy of patient instruction materials.   Telephone follow up appointment with care management team member scheduled for: 8/11 at 2:30 pm  [name], PharmD, [place] [phone]

## 2018-08-18 ENCOUNTER — Ambulatory Visit (INDEPENDENT_AMBULATORY_CARE_PROVIDER_SITE_OTHER): Payer: Medicare Other | Admitting: Pharmacist

## 2018-08-18 DIAGNOSIS — I10 Essential (primary) hypertension: Secondary | ICD-10-CM

## 2018-08-18 NOTE — Chronic Care Management (AMB) (Signed)
  Chronic Care Management   Follow Up Note   08/18/2018 Name: Randy [name] MRN: [id number] DOB: [birth year]  Referred by: [name], DO Reason for referral : Chronic Care Management (Patient Phone Call)   Randy [name] is a 84 y.o. year old male who is a primary care patient of [name], DO. The CCM team was consulted for assistance with chronic disease management and care coordination needs.  [name] has a past medical history including, but not limited to: hypertension, osteoarthritis, hyperlipidemia and memory loss.  I reached out to [employer] by phone today. With permission from patient, allso place call to patient's daughter, [name].  Review of patient status, including review of consultants reports, relevant laboratory and other test results, and collaboration with appropriate care team members and the patient's provider was performed as part of comprehensive patient evaluation and provision of chronic care management services.    Goals Addressed            This Visit's Progress   . Medication Management       Current Barriers:  . Cognitive Deficits  Pharmacist Clinical Goal(s):  [name] Over the next 30 days, patient/caregiver will work with CM Pharmacist to address needs related to medication management and blood pressure mangement  Interventions: . Follow up with [name], his wife, and his daughter [name] regarding patient's medication adherence o Confirm daughter has been aiding with filling pillbox and checking for adherence - [name] denies having observed any missed doses from the pillbox - Reports having left enzyme CoQ10 out of pillbox with plan to discontinue this supplement as directed . Review recent blood pressures with patient o Mr. Ohlin reports that he forgot to check blood pressure from 8/1 to 8/7 o Encourage patient to check blood pressure daily and keep log. Ask [name] and daughter to aid patient with adherence to checking blood  pressure o Counsel on blood pressure monitoring technique. [name] reports sitting for at least 5 minutes before checking his blood pressure . Will mail patient a large-print blood pressure log and handout on blood pressure monitoring as requested . Will follow up with patient's PCP regarding his reported blood pressure results  Patient Self Care Activities:  . Patient to take medications as directed with assistance of daughter [name] to aid [name] with filling his weekly pillbox and monitoring for adherence . Attends all scheduled provider appointments . Calls pharmacy for medication refills . Calls provider office for new concerns or questions . Checks blood pressure daily and keeps log Date AM Blood Pressure PM Blood Pressure  8 - August 164/87, HR 70 161/89, HR 71  9 - August 148/88, HR 72 -  10 - August 152/85, HR 66  150/88, HR 73  11 - August 163/84, HR 61     Please see past updates related to this goal by clicking on the "Past Updates" button in the selected goal         Plan  Follow up with provider re: hypertension management The care management team will reach out to the patient again over the next 7 days.    [name], PharmD, [place] [employer] [phone]

## 2018-08-18 NOTE — Patient Instructions (Signed)
Thank you allowing the Chronic Care Management Team to be a part of your care! It was a pleasure speaking with you today!     CCM (Chronic Care Management) Team    Janci Minor RN, BSN Nurse Care Coordinator  [phone]   [name] PharmD  Clinical Pharmacist  [phone]   Eula Fried LCSW Clinical Social Worker [phone]  Visit Information  Goals Addressed            This Visit's Progress   . Medication Management       Current Barriers:  . Cognitive Deficits  Pharmacist Clinical Goal(s):  [name] Over the next 30 days, patient/caregiver will work with CM Pharmacist to address needs related to medication management and blood pressure mangement  Interventions: . Follow up with Mr. Bartosiewicz, his wife, and his daughter [name] regarding patient's medication adherence o Confirm daughter has been aiding with filling pillbox and checking for adherence - [name] denies having observed any missed doses from the pillbox - Reports having left enzyme CoQ10 out of pillbox with plan to discontinue this supplement as directed . Review recent blood pressures with patient o Mr. Cianci reports that he forgot to check blood pressure from 8/1 to 8/7 o Encourage patient to check blood pressure daily and keep log. Ask Mrs. Noxon and daughter to aid patient with adherence to checking blood pressure o Counsel on blood pressure monitoring technique. Mr. Lares reports sitting for at least 5 minutes before checking his blood pressure . Will mail patient a large-print blood pressure log and handout on blood pressure monitoring as requested . Will follow up with patient's PCP regarding his reported blood pressure results  Patient Self Care Activities:  . Patient to take medications as directed with assistance of daughter [name] to aid Mr. Helseth with filling his weekly pillbox and monitoring for adherence . Attends all scheduled provider appointments . Calls pharmacy for medication  refills . Calls provider office for new concerns or questions . Checks blood pressure daily and keeps log Date AM Blood Pressure PM Blood Pressure  8 - August 164/87, HR 70 161/89, HR 71  9 - August 148/88, HR 72 -  10 - August 152/85, HR 66  150/88, HR 73  11 - August 163/84, HR 61     Please see past updates related to this goal by clicking on the "Past Updates" button in the selected goal         The patient verbalized understanding of instructions provided today and declined a print copy of patient instruction materials.   The care management team will reach out to the patient again over the next 7 days.   [name], PharmD, [place] [employer] [phone]

## 2018-08-19 ENCOUNTER — Ambulatory Visit: Payer: Self-pay | Admitting: Pharmacist

## 2018-08-19 ENCOUNTER — Other Ambulatory Visit: Payer: Self-pay | Admitting: Family Medicine

## 2018-08-19 DIAGNOSIS — I10 Essential (primary) hypertension: Secondary | ICD-10-CM

## 2018-08-19 MED ORDER — HYDROCHLOROTHIAZIDE 12.5 MG PO TABS: 12.5000 mg | ORAL_TABLET | Freq: Every day | ORAL | 1 refills | Status: DC

## 2018-08-19 NOTE — Patient Instructions (Signed)
Thank you allowing the Chronic Care Management Team to be a part of your care! It was a pleasure speaking with you today!     CCM (Chronic Care Management) Team    Janci Minor RN, BSN Nurse Care Coordinator  [phone]   [name] PharmD  Clinical Pharmacist  [phone]   Eula Fried LCSW Clinical Social Worker [phone]  Visit Information  Goals Addressed            This Visit's Progress   . Medication Management       Current Barriers:  . Cognitive Deficits  Pharmacist Clinical Goal(s):  [name] Over the next 30 days, patient/caregiver will work with CM Pharmacist to address needs related to medication management and blood pressure mangement  Interventions: . Collaborate with patient's PCP regarding his hypertension management o Dr. Parks Ranger recommends patient add HCTZ 12.5mg  once daily . Follow up with Mr. Pacholski and his daughter, [name], regarding PCP's recommendation to add HCTZ 12.5 mg once daily for improved blood pressure control. o Patient and daughter verbalize understanding and ask that prescription be sent to patient's local [facility]. o Counsel that patient should take HCTZ in the morning to avoid nocturia . Counsel regarding continued importance of medication adherence and adherence to blood pressure monitoring  Patient Self Care Activities:  . Patient to take medications as directed with assistance of daughter [name] to aid Mr. Degante with filling his weekly pillbox and monitoring for adherence . Attends all scheduled provider appointments . Calls pharmacy for medication refills . Calls provider office for new concerns or questions . Checks blood pressure daily and keeps log  Please see past updates related to this goal by clicking on the "Past Updates" button in the selected goal         The patient verbalized understanding of instructions provided today and declined a print copy of patient instruction materials.   The care  management team will reach out to the patient again over the next 10 days.   [name], PharmD, [place] [employer] [phone]

## 2018-08-19 NOTE — Chronic Care Management (AMB) (Signed)
  Chronic Care Management   Follow Up Note   08/19/2018 Name: Randy [name] MRN: [id number] DOB: [birth year]  Referred by: [name], DO Reason for referral : Chronic Care Management (Patient Phone Call)   Randy [name] is a 84 y.o. year old male who is a primary care patient of [name], DO. The CCM team was consulted for assistance with chronic disease management and care coordination needs. Randy [name] has a past medical history including, but not limited to: hypertension, osteoarthritis, hyperlipidemia and memory loss.  I reached out to [employer] by phone today. With permission from patient, allso place call to patient's daughter, Randy [name].  Review of patient status, including review of consultants reports, relevant laboratory and other test results, and collaboration with appropriate care team members and the patient's provider was performed as part of comprehensive patient evaluation and provision of chronic care management services.    Goals Addressed            This Visit's Progress   . Medication Management       Current Barriers:  . Cognitive Deficits  Pharmacist Clinical Goal(s):  [name] Over the next 30 days, patient/caregiver will work with CM Pharmacist to address needs related to medication management and blood pressure mangement  Interventions: . Collaborate with patient's PCP regarding his hypertension management o Dr. Parks Ranger recommends patient add HCTZ 12.5mg  once daily . Follow up with Mr. Standley and his daughter, Randy [name], regarding PCP's recommendation to add HCTZ 12.5 mg once daily for improved blood pressure control. o Patient and daughter verbalize understanding and ask that prescription be sent to [facility]'s local [facility]. o Counsel that patient should take HCTZ in the morning to avoid nocturia . Counsel regarding continued importance of medication adherence and adherence to blood pressure monitoring . Randy [name] shares that while she  and her parents acknowleged advice given on 8/7 that patient may discontinue the CoQ10 supplement as he is no longer taking statin, Mrs. Banks wants patient to continue to take the supplement as she feels that it is good for his heart.  Patient Self Care Activities:  . Patient to take medications as directed with assistance of daughter Randy [name] to aid Randy [name] with filling his weekly pillbox and monitoring for adherence . Attends all scheduled provider appointments . Calls pharmacy for medication refills . Calls provider office for new concerns or questions . Checks blood pressure daily and keeps log  Please see past updates related to this goal by clicking on the "Past Updates" button in the selected goal         Plan  The care management team will reach out to the patient again over the next 10 days.   [name], PharmD, [place] [employer] [phone]

## 2018-09-08 ENCOUNTER — Other Ambulatory Visit: Payer: Self-pay

## 2018-09-08 ENCOUNTER — Emergency Department: Payer: Medicare Other

## 2018-09-08 ENCOUNTER — Inpatient Hospital Stay
Admission: EM | Admit: 2018-09-08 | Discharge: 2018-09-11 | DRG: 418 | Disposition: A | Discharge status: Home Health | Payer: Medicare Other | Attending: Internal Medicine | Admitting: Internal Medicine

## 2018-09-08 ENCOUNTER — Inpatient Hospital Stay: Payer: Medicare Other

## 2018-09-08 DIAGNOSIS — E785 Hyperlipidemia, unspecified: Secondary | ICD-10-CM | POA: Diagnosis present

## 2018-09-08 DIAGNOSIS — R945 Abnormal results of liver function studies: Secondary | ICD-10-CM

## 2018-09-08 DIAGNOSIS — Z87891 Personal history of nicotine dependence: Secondary | ICD-10-CM | POA: Diagnosis not present

## 2018-09-08 DIAGNOSIS — R1084 Generalized abdominal pain: Secondary | ICD-10-CM

## 2018-09-08 DIAGNOSIS — R7989 Other specified abnormal findings of blood chemistry: Secondary | ICD-10-CM

## 2018-09-08 DIAGNOSIS — K82A1 Gangrene of gallbladder in cholecystitis: Secondary | ICD-10-CM | POA: Diagnosis present

## 2018-09-08 DIAGNOSIS — K219 Gastro-esophageal reflux disease without esophagitis: Secondary | ICD-10-CM | POA: Diagnosis present

## 2018-09-08 DIAGNOSIS — Z79899 Other long term (current) drug therapy: Secondary | ICD-10-CM | POA: Diagnosis not present

## 2018-09-08 DIAGNOSIS — K8000 Calculus of gallbladder with acute cholecystitis without obstruction: Secondary | ICD-10-CM | POA: Diagnosis present

## 2018-09-08 DIAGNOSIS — N3289 Other specified disorders of bladder: Secondary | ICD-10-CM | POA: Diagnosis present

## 2018-09-08 DIAGNOSIS — I10 Essential (primary) hypertension: Secondary | ICD-10-CM | POA: Diagnosis present

## 2018-09-08 DIAGNOSIS — R4701 Aphasia: Secondary | ICD-10-CM | POA: Diagnosis present

## 2018-09-08 DIAGNOSIS — M810 Age-related osteoporosis without current pathological fracture: Secondary | ICD-10-CM | POA: Diagnosis present

## 2018-09-08 DIAGNOSIS — E876 Hypokalemia: Secondary | ICD-10-CM | POA: Diagnosis present

## 2018-09-08 DIAGNOSIS — Z7982 Long term (current) use of aspirin: Secondary | ICD-10-CM | POA: Diagnosis not present

## 2018-09-08 DIAGNOSIS — F0280 Dementia in other diseases classified elsewhere without behavioral disturbance: Secondary | ICD-10-CM | POA: Diagnosis present

## 2018-09-08 DIAGNOSIS — R339 Retention of urine, unspecified: Secondary | ICD-10-CM | POA: Diagnosis present

## 2018-09-08 DIAGNOSIS — G3183 Dementia with Lewy bodies: Secondary | ICD-10-CM | POA: Diagnosis present

## 2018-09-08 DIAGNOSIS — K810 Acute cholecystitis: Secondary | ICD-10-CM

## 2018-09-08 DIAGNOSIS — K819 Cholecystitis, unspecified: Secondary | ICD-10-CM

## 2018-09-08 DIAGNOSIS — Z7951 Long term (current) use of inhaled steroids: Secondary | ICD-10-CM | POA: Diagnosis not present

## 2018-09-08 DIAGNOSIS — R4182 Altered mental status, unspecified: Secondary | ICD-10-CM

## 2018-09-08 LAB — URINALYSIS, COMPLETE (UACMP) WITH MICROSCOPIC
Bacteria, UA: NONE SEEN
Bilirubin Urine: NEGATIVE
Glucose, UA: NEGATIVE mg/dL
Hgb urine dipstick: NEGATIVE
Ketones, ur: NEGATIVE mg/dL
Leukocytes,Ua: NEGATIVE
Nitrite: NEGATIVE
Protein, ur: NEGATIVE mg/dL
Specific Gravity, Urine: 1.017 (ref 1.005–1.030)
Squamous Epithelial / HPF: NONE SEEN (ref 0–5)
pH: 6 (ref 5.0–8.0)

## 2018-09-08 LAB — LIPASE, BLOOD: Lipase: 30 U/L (ref 11–51)

## 2018-09-08 LAB — CBC WITH DIFFERENTIAL/PLATELET
Abs Immature Granulocytes: 0.05 10*3/uL (ref 0.00–0.07)
Basophils Absolute: 0 10*3/uL (ref 0.0–0.1)
Basophils Relative: 0 %
Eosinophils Absolute: 0 10*3/uL (ref 0.0–0.5)
Eosinophils Relative: 0 %
HCT: 42 % (ref 39.0–52.0)
Hemoglobin: 14.1 g/dL (ref 13.0–17.0)
Immature Granulocytes: 0 %
Lymphocytes Relative: 6 %
Lymphs Abs: 0.8 10*3/uL (ref 0.7–4.0)
MCH: 29.2 pg (ref 26.0–34.0)
MCHC: 33.6 g/dL (ref 30.0–36.0)
MCV: 87 fL (ref 80.0–100.0)
Monocytes Absolute: 0.8 10*3/uL (ref 0.1–1.0)
Monocytes Relative: 6 %
Neutro Abs: 11.3 10*3/uL — ABNORMAL HIGH (ref 1.7–7.7)
Neutrophils Relative %: 88 %
Platelets: 209 10*3/uL (ref 150–400)
RBC: 4.83 MIL/uL (ref 4.22–5.81)
RDW: 13.9 % (ref 11.5–15.5)
WBC: 13 10*3/uL — ABNORMAL HIGH (ref 4.0–10.5)
nRBC: 0 % (ref 0.0–0.2)

## 2018-09-08 LAB — TROPONIN I (HIGH SENSITIVITY)
Troponin I (High Sensitivity): 10 ng/L (ref <18)
Troponin I (High Sensitivity): 10 ng/L (ref <18)

## 2018-09-08 LAB — COMPREHENSIVE METABOLIC PANEL
ALT: 11 U/L (ref 0–44)
AST: 24 U/L (ref 15–41)
Albumin: 3.9 g/dL (ref 3.5–5.0)
Alkaline Phosphatase: 70 U/L (ref 38–126)
Anion gap: 11 (ref 5–15)
BUN: 26 mg/dL — ABNORMAL HIGH (ref 8–23)
CO2: 30 mmol/L (ref 22–32)
Calcium: 9 mg/dL (ref 8.9–10.3)
Chloride: 97 mmol/L — ABNORMAL LOW (ref 98–111)
Creatinine, Ser: 0.99 mg/dL (ref 0.61–1.24)
GFR calc Af Amer: >60 mL/min (ref >60)
GFR calc non Af Amer: >60 mL/min (ref >60)
Glucose, Bld: 117 mg/dL — ABNORMAL HIGH (ref 70–99)
Potassium: 3.8 mmol/L (ref 3.5–5.1)
Sodium: 138 mmol/L (ref 135–145)
Total Bilirubin: 1.3 mg/dL — ABNORMAL HIGH (ref 0.3–1.2)
Total Protein: 7.5 g/dL (ref 6.5–8.1)

## 2018-09-08 LAB — LACTIC ACID, PLASMA: Lactic Acid, Venous: 0.9 mmol/L (ref 0.5–1.9)

## 2018-09-08 LAB — SARS CORONAVIRUS 2 BY RT PCR (HOSPITAL ORDER, PERFORMED IN ~~LOC~~ HOSPITAL LAB): SARS Coronavirus 2: NEGATIVE

## 2018-09-08 MED ORDER — SODIUM CHLORIDE 0.9 % IV BOLUS
1000.0000 mL | Freq: Once | INTRAVENOUS | Status: AC
  Administered 2018-09-08: 1000 mL via INTRAVENOUS

## 2018-09-08 MED ORDER — ONDANSETRON HCL 4 MG/2ML IJ SOLN
4.0000 mg | Freq: Four times a day (QID) | INTRAMUSCULAR | Status: DC | PRN
  Administered 2018-09-11: 4 mg via INTRAVENOUS
  Filled 2018-09-08: qty 2

## 2018-09-08 MED ORDER — PIPERACILLIN-TAZOBACTAM 3.375 G IVPB 30 MIN
3.3750 g | Freq: Once | INTRAVENOUS | Status: AC
  Administered 2018-09-08: 3.375 g via INTRAVENOUS
  Filled 2018-09-08: qty 50

## 2018-09-08 MED ORDER — IOHEXOL 300 MG/ML  SOLN
100.0000 mL | Freq: Once | INTRAMUSCULAR | Status: AC | PRN
  Administered 2018-09-08: 100 mL via INTRAVENOUS

## 2018-09-08 MED ORDER — TAMSULOSIN HCL 0.4 MG PO CAPS
0.4000 mg | ORAL_CAPSULE | Freq: Every day | ORAL | Status: DC
  Administered 2018-09-09 – 2018-09-11 (×3): 0.4 mg via ORAL
  Filled 2018-09-08 (×3): qty 1

## 2018-09-08 MED ORDER — MORPHINE SULFATE (PF) 2 MG/ML IV SOLN
1.0000 mg | INTRAVENOUS | Status: DC | PRN
  Administered 2018-09-09 – 2018-09-11 (×7): 1 mg via INTRAVENOUS
  Filled 2018-09-08 (×7): qty 1

## 2018-09-08 MED ORDER — SODIUM CHLORIDE 0.9 % IV SOLN
INTRAVENOUS | Status: DC
  Administered 2018-09-09 – 2018-09-10 (×4): via INTRAVENOUS

## 2018-09-08 MED ORDER — IOHEXOL 240 MG/ML SOLN
50.0000 mL | Freq: Once | INTRAMUSCULAR | Status: AC | PRN
  Administered 2018-09-08: 50 mL via ORAL

## 2018-09-08 MED ORDER — ACETAMINOPHEN 325 MG PO TABS
650.0000 mg | ORAL_TABLET | Freq: Four times a day (QID) | ORAL | Status: DC | PRN
  Administered 2018-09-09: 650 mg via ORAL
  Filled 2018-09-08: qty 2

## 2018-09-08 NOTE — ED Notes (Signed)
Patient transported to MRI 

## 2018-09-08 NOTE — Progress Notes (Signed)
Family Meeting Note  Advance Directive:yes  Today a meeting took place with the Patient and daughter.   The following clinical team members were present during this meeting:MD  The following were discussed:Patient's diagnosis: Acute cholecystitis, aphasia, urinary retention, hypertension, Patient's progosis: Unable to determine and Goals for treatment: Full Code  Additional follow-up to be provided: Surgery  Time spent during discussion:20 minutes  Vaughan Basta, MD

## 2018-09-08 NOTE — ED Notes (Signed)
Pt has blood in foley cath.  md aware.  Pt in no acute distress.

## 2018-09-08 NOTE — Consult Note (Signed)
Subjective:   CC: Abdominal pain  HPI:  Randy [name] is a 84 y.o. male who is consulted by [facility] for evaluation of above cc.  Symptoms were first noted 1 days ago. Pain is sharp, localized to right upper quadrant.  Associated with nothing specific, exacerbated by nothing specific.  Noted to have this abdominal pain during work-up for sudden memory loss and difficulty finding words which prompted the ED.  CT scan showed evidence of acute cholecystitis so surgery was consulted.  Patient and family were at bedside states that he has never had any issues with abdominal pain before.  CT scan also noted a distended bladder.  Patient stated that he usually does not have any issues urinating.     Past Medical History:  has a past medical history of GERD (gastroesophageal reflux disease), Hyperlipidemia, Hypertension, and Osteoporosis.  Lewy body dementia  Past Surgical History:  has a past surgical history that includes Hernia repair (N/A, 2008) and Ganglion cyst excision (Left, 2013).  Family History: family history includes Breast cancer in his mother; Heart disease in his brother; Leukemia in his brother; Lung cancer in his father.  Social History:  reports that he quit smoking about 52 years ago. His smoking use included cigarettes. He has a 12.00 pack-year smoking history. He has quit using smokeless tobacco. He reports that he does not drink alcohol or use drugs.  Current Medications:  aspirin EC 81 MG tablet Take 81 mg by mouth daily. [provider] Needs Review  carbidopa-levodopa (SINEMET IR) 25-100 MG tablet Take 1 tablet by mouth 3 (three) times daily. [provider] Needs Review  cholecalciferol (VITAMIN D) 1000 units tablet Take 1,000 Units by mouth daily. [provider] Needs Review  Coenzyme Q10 (CO Q 10) 100 MG CAPS Take 1 capsule by mouth daily.  [provider] Needs Review  enalapril (VASOTEC) 20 MG tablet TAKE 1 TABLET BY MOUTH TWICE A DAY  Karamalegos, Alexander J, DO Needs Review  fluticasone (FLONASE) 50 MCG/ACT nasal spray Place 2 sprays into both nostrils daily.  [provider] Needs Review  hydrochlorothiazide (HYDRODIURIL) 12.5 MG tablet Take 1 tablet (12.5 mg total) by mouth daily. Karamalegos, Devonne Doughty, DO Needs Review  meloxicam (MOBIC) 15 MG tablet TAKE 1 TABLET (15 MG TOTAL) BY MOUTH DAILY AS NEEDED (MODERATE BACK PAIN). [name], DO Needs Review  Omega-3 Fatty Acids (FISH OIL) 1200 MG CAPS Take by mouth. [provider] Needs Review  vitamin C (ASCORBIC ACID) 500 MG tablet Take 500 mg by mouth daily. [provider] Needs Review  furosemide (LASIX) 20 MG tablet Take 1 tablet (20 mg total) by mouth daily as needed for edema. For 1 week take 2 pills in morning then resume normal dose. [name], DO Needs Review   Patient not taking: Reported on 06/17/2018       Allergies:  Allergies as of 09/08/2018 - Review Complete 09/08/2018  Allergen Reaction Noted  . Antihistamines, chlorpheniramine-type Other (See Comments) 07/06/2012    ROS:  General: Denies weight loss, weight gain, fatigue, fevers, chills, and night sweats. Eyes: Denies blurry vision, double vision, eye pain, itchy eyes, and tearing. Ears: Denies hearing loss, earache, and ringing in ears. Nose: Denies sinus pain, congestion, infections, runny nose, and nosebleeds. Mouth/throat: Denies hoarseness, sore throat, bleeding gums, and difficulty swallowing. Heart: Denies chest pain, palpitations, racing heart, irregular heartbeat, leg pain or swelling, and decreased activity tolerance. Respiratory: Denies breathing difficulty, shortness of breath, wheezing, cough,  and sputum. GI: Denies change in appetite, heartburn, nausea, vomiting, constipation, diarrhea, and blood in stool. GU: denies pain with urinating, urgency, frequency, blood in urine. Musculoskeletal: Denies joint stiffness, pain, swelling,  muscle weakness. Skin: Denies rash, itching, mass, tumors, sores, and boils Neurologic: Denies headache, fainting, dizziness, seizures, numbness, and tingling. Psychiatric: Denies depression, anxiety, difficulty sleeping, and memory loss. Endocrine: Denies heat or cold intolerance, and increased thirst or urination. Blood/lymph: Denies easy bruising, easy bruising, and swollen glands     Objective:     BP 137/85   Pulse (!) 103   Resp (!) 30   Ht 6' (1.829 m)   Wt 90.7 kg   SpO2 92%   BMI 27.12 kg/m    Constitutional :  alert, cooperative, appears stated age and no distress  Lymphatics/Throat:  supple without significant adenopathy  Respiratory:  clear to auscultation bilaterally  Cardiovascular:  regular rate and rhythm  Gastrointestinal: Soft, no guarding, but focal tenderness to palpation in far right upper quadrant.   Musculoskeletal:  Intentional tremors noted during exam   Skin: Cool and moist, midline supraumbilical surgical scars  Psychiatric: Normal affect, non-agitated, not confused       LABS:  CMP Latest Ref Rng & Units 09/08/2018 06/10/2018 06/09/2017  Glucose 70 - 99 mg/dL 117(H) 102(H) 96  BUN 8 - 23 mg/dL 26(H) 28(H) 19  Creatinine 0.61 - 1.24 mg/dL 0.99 1.07 0.85  Sodium 135 - 145 mmol/L 138 141 140  Potassium 3.5 - 5.1 mmol/L 3.8 3.8 4.2  Chloride 98 - 111 mmol/L 97(L) 104 103  CO2 22 - 32 mmol/L 30 30 31   Calcium 8.9 - 10.3 mg/dL 9.0 8.6 8.9  Total Protein 6.5 - 8.1 g/dL 7.5 6.0(L) 6.6  Total Bilirubin 0.3 - 1.2 mg/dL 1.3(H) 0.9 0.8  Alkaline Phos 38 - 126 U/L 70 - -  AST 15 - 41 U/L 24 15 10   ALT 0 - 44 U/L 11 8(L) 4(L)   CBC Latest Ref Rng & Units 09/08/2018 06/10/2018 06/09/2017  WBC 4.0 - 10.5 K/uL 13.0(H) 6.1 5.9  Hemoglobin 13.0 - 17.0 g/dL 14.1 12.1(L) 13.4  Hematocrit 39.0 - 52.0 % 42.0 36.9(L) 40.0  Platelets 150 - 400 K/uL 209 208 201     RADS: CLINICAL DATA:  Pain.  EXAM: CT ABDOMEN AND PELVIS WITH CONTRAST  TECHNIQUE: Multidetector  CT imaging of the abdomen and pelvis was performed using the standard protocol following bolus administration of intravenous contrast.  CONTRAST:  126mL OMNIPAQUE IOHEXOL 300 MG/ML  SOLN  COMPARISON:  None.  FINDINGS: Lower chest: Moderate hiatal hernia. Aortic atherosclerosis. Borderline cardiomegaly.  Hepatobiliary: The liver appears normal. The gallbladder is distended with numerous stones and sludge in the gallbladder. Edema of the gallbladder wall. No dilated bile ducts. Slight pericholecystic soft tissue stranding.  Pancreas: Unremarkable. No pancreatic ductal dilatation or surrounding inflammatory changes.  Spleen: Normal in size without focal abnormality.  Adrenals/Urinary Tract: Numerous bilateral renal cysts of various densities. The largest cyst is in the mid left kidney measuring 11.7 cm in diameter. There is a lobulated 4.6 cm dense cyst on the lower pole the left kidney which does not enhance on delayed imaging. There are similar dense cysts in the right kidney. No hydronephrosis. There is marked distention of the bladder with enlargement of the median lobe of the prostate gland. There are least 2 stones in the dependent portion of the bladder. There are diverticula in the dome of the bladder.  Stomach/Bowel: Moderate hiatal hernia. No  dilated large or small bowel. The appendix is not discretely identified.  Vascular/Lymphatic: Aortic atherosclerosis. No enlarged abdominal or pelvic lymph nodes.  Reproductive: Enlarged median lobe of the prostate gland.  Other: No abdominal wall hernia or abnormality. No abdominopelvic ascites.  Musculoskeletal: No acute abnormality. Severe arthritis of the left hip. Diffuse degenerative disc disease in the lumbar spine. Fusion of the left SI joint.  IMPRESSION: 1. Distended gallbladder with numerous stones and sludge in the gallbladder. Edema of the gallbladder wall with slight pericholecystic soft tissue  stranding. The findings are consistent with acute cholecystitis. 2. Marked chronic or recurrent distention of the bladder with diverticula in the dome of the bladder, chronic. 3. Multiple bilateral renal cysts of various densities. 4. Moderate hiatal hernia.  Aortic Atherosclerosis (ICD10-I70.0).   Electronically Signed   By: Lorriane Shire M.D.   On: 09/08/2018 20:27  CLINICAL DATA:  Altered mental status.  EXAM: CT HEAD WITHOUT CONTRAST  TECHNIQUE: Contiguous axial images were obtained from the base of the skull through the vertex without intravenous contrast.  COMPARISON:  MRI dated 08/12/2018  FINDINGS: Brain: Diffuse cerebral cortical and cerebellar atrophy with minimal dilatation of the ventricles consistent with the atrophy. No hemorrhage or infarction or mass lesion. Minimal periventricular white matter lucency consistent with chronic small vessel ischemic disease.  Vascular: No hyperdense vessel or unexpected calcification.  Skull: Normal. Negative for fracture or focal lesion.  Sinuses/Orbits: No acute abnormality. Chronic opacification of the portion of the left side of the frontal sinus.  Other: None  IMPRESSION: No acute intracranial abnormality. Atrophy with minimal chronic small vessel ischemic changes.   Electronically Signed   By: Lorriane Shire M.D.   On: 09/08/2018 20:30 Assessment:      Right upper quadrant abdominal pain with CT concerning for acute cholecystitis.  Acute mental status change along with difficulty finding words.  Initial CT head negative  Plan:     With the increased leukocytosis pain in the right upper quadrant and CT findings concerning for acute cholecystitis, will start antibiotics as treatment.  The increased distention noted in the abdomen likely is from the distended gallbladder as well as the distended urinary bladder.  Pending Foley catheter placement.  There is a chance the leukocytosis could  potentially be from his retained urine as well.  Pending urinalysis.  Will follow up on LFTs to ensure no increased total bilirubin levels.  Regarding his acute mental status change and difficulty finding words that initially brought him to the emergency department, will defer to the hospitalist service for complete work-up to ensure no possibility of any cerebrovascular issues.  He does have a known history of Lewy body dementia seen by neurologist in the past.  If he is cleared from a neurovascular standpoint and does not require any anticoagulation or further treatment, will likely consider laparoscopic cholecystectomy during this visit.  Plan was discussed with patient as well as family member at bedside along with the hospitalist and they are all in agreement.

## 2018-09-08 NOTE — ED Triage Notes (Signed)
EMS called to pt home for leg weakness and AMS. Pt has history of dementia but daughter states pt is slightly more confused than normal. Daughter states patient had PT yesterday for leg weakness and he may be sore from activity. Also reports pt urinating more than normal. Stomach distended but soft and nontender.  Hx of hypertension and dementia.  BP 122/84 BS 116 O2 97% on room air T 98.1 oral

## 2018-09-08 NOTE — ED Notes (Signed)
EMTALA reviewed. 

## 2018-09-08 NOTE — ED Provider Notes (Signed)
[facility] Emergency Department Provider Note   ____________________________________________   First MD Initiated Contact with Patient 09/08/18 1821     (approximate)  I have reviewed the triage vital signs and the nursing notes.   HISTORY  Chief Complaint Leg Pain and Altered Mental Status    HPI Randy [name] is a 84 y.o. male who lives at home.  Reportedly he went to get the mail from the mailbox this afternoon and when he got back he began having much increased word finding difficulty and was unsteady on his feet.  This is continued and so his daughter brought him into the emergency room.  He also complained of some pain in the left hip area and some abdominal pain.  He had some physical therapy earlier and this may account for some of the pain.  He has no known fall.      Past Medical History:  Diagnosis Date   GERD (gastroesophageal reflux disease)    Hyperlipidemia    Hypertension    Osteoporosis     Patient Active Problem List   Diagnosis Date Noted   Myalgia due to statin 02/24/2018   Tricompartment osteoarthritis of left knee 11/14/2017   Generalized weakness 04/28/2017   Chronic low back pain 03/12/2017   Knee pain, chronic 10/01/2016   Chronic venous insufficiency 08/30/2016   BPH without obstruction/lower urinary tract symptoms 03/06/2016   Osteoarthritis of multiple joints 03/06/2016   Degenerative joint disease (DJD) of lumbar spine 03/06/2016   Hypertension 03/05/2016   Hyperlipidemia 03/05/2016   Osteoporosis 03/05/2016   GERD (gastroesophageal reflux disease) 03/05/2016   Bilateral lower extremity edema 03/05/2016    Past Surgical History:  Procedure Laterality Date   GANGLION CYST EXCISION Left 2013   Left upper arm   HERNIA REPAIR N/A 2008   MIdline, ventral acquired hernia repair    Prior to Admission medications   Medication Sig Start Date End Date Taking? Authorizing Provider  aspirin EC  81 MG tablet Take 81 mg by mouth daily.    [provider]  carbidopa-levodopa (SINEMET IR) 25-100 MG tablet Take 1 tablet by mouth 3 (three) times daily. 07/29/18 10/27/18  [provider]  cholecalciferol (VITAMIN D) 1000 units tablet Take 1,000 Units by mouth daily.    [provider]  Coenzyme Q10 (CO Q 10) 100 MG CAPS Take by mouth.    [provider]  enalapril (VASOTEC) 20 MG tablet TAKE 1 TABLET BY MOUTH TWICE A DAY 06/26/18   Karamalegos, Alexander J, DO  fluticasone (FLONASE) 50 MCG/ACT nasal spray Place 2 sprays into both nostrils daily.     [provider]  furosemide (LASIX) 20 MG tablet Take 1 tablet (20 mg total) by mouth daily as needed for edema. For 1 week take 2 pills in morning then resume normal dose. Patient not taking: Reported on 06/17/2018 07/31/16   [name], DO  hydrochlorothiazide (HYDRODIURIL) 12.5 MG tablet Take 1 tablet (12.5 mg total) by mouth daily. 08/19/18   Karamalegos, Devonne Doughty, DO  meloxicam (MOBIC) 15 MG tablet TAKE 1 TABLET (15 MG TOTAL) BY MOUTH DAILY AS NEEDED (MODERATE BACK PAIN). 07/07/18   Karamalegos, Devonne Doughty, DO  Omega-3 Fatty Acids (FISH OIL) 1200 MG CAPS Take by mouth.    [provider]  vitamin C (ASCORBIC ACID) 500 MG tablet Take 500 mg by mouth daily.    [provider]    Allergies Antihistamines, chlorpheniramine-type  Family History  Problem Relation  Age of Onset   Breast cancer Mother    Lung cancer Father    Heart disease Brother    Leukemia Brother    Prostate cancer Neg Hx    Colon cancer Neg Hx    Bladder Cancer Neg Hx    Kidney cancer Neg Hx     Social History Social History   Tobacco Use   Smoking status: Former Smoker    Packs/day: 1.00    Years: 12.00    Pack years: 12.00    Types: Cigarettes    Quit date: 1968    Years since quitting: 52.7   Smokeless tobacco: Former Systems developer  Substance Use Topics   Alcohol use: No   Drug  use: No    Review of Systems  Constitutional: No fever/chills Eyes: No visual changes. ENT: No sore throat. Cardiovascular: Denies chest pain. Respiratory: Denies shortness of breath. Gastrointestinal: No abdominal pain.  No nausea, no vomiting.  No diarrhea.  No constipation. Genitourinary: Negative for dysuria. Musculoskeletal: Negative for back pain. Skin: Negative for rash. Neurological: Negative for headaches, focal weakness    ____________________________________________   PHYSICAL EXAM:  VITAL SIGNS: ED Triage Vitals [09/08/18 1808]  Enc Vitals Group     BP      Pulse      Resp      Temp      Temp src      SpO2      Weight      Height      Head Circumference      Peak Flow      Pain Score 0     Pain Loc      Pain Edu?      Excl. in [place]?     Constitutional: Alert and oriented. Well appearing and in no acute distress. Eyes: Conjunctivae are normal. PERRL. EOMI. Head: Atraumatic. Nose: No congestion/rhinnorhea. Mouth/Throat: Mucous membranes are moist.  Oropharynx non-erythematous. Neck: No stridor. Cardiovascular: Normal rate, regular rhythm. Grossly normal heart sounds.  Good peripheral circulation. Respiratory: Normal respiratory effort.  No retractions. Lungs CTAB. Gastrointestinal: Soft mildly diffusely tender no distention. No abdominal bruits. No CVA tenderness. Musculoskeletal: No lower extremity tenderness nor edema!  Neurologic:  Normal speech and language. No gross focal neurologic deficits are appreciated.  Cranial nerves II through XII are intact although visual fields were not checked cerebellar finger-to-nose and rapid alternating movements and hands are normal motor strength is diffusely weak throughout but there is no focal findings that I can see Skin:  Skin is warm, dry and intact. No rash noted.   ____________________________________________   LABS (all labs ordered are listed, but only abnormal results are displayed)  Labs Reviewed    COMPREHENSIVE METABOLIC PANEL - Abnormal; Notable for the following components:      Result Value   Chloride 97 (*)    Glucose, Bld 117 (*)    BUN 26 (*)    Total Bilirubin 1.3 (*)    All other components within normal limits  CBC WITH DIFFERENTIAL/PLATELET - Abnormal; Notable for the following components:   WBC 13.0 (*)    Neutro Abs 11.3 (*)    All other components within normal limits  SARS CORONAVIRUS 2 (HOSPITAL ORDER, [place] [facility])  LIPASE, BLOOD  LACTIC ACID, PLASMA  URINALYSIS, COMPLETE (UACMP) WITH MICROSCOPIC  TROPONIN I (HIGH SENSITIVITY)  TROPONIN I (HIGH SENSITIVITY)   ____________________________________________  EKG  EKG read interpreted by me shows normal sinus rhythm at a  rate of 99 left axis decreased R wave progression otherwise no obvious changes ____________________________________________  RADIOLOGY  ED MD interpretation:   Official radiology report(s): Ct Head Wo Contrast  Result Date: 09/08/2018 CLINICAL DATA:  Altered mental status. EXAM: CT HEAD WITHOUT CONTRAST TECHNIQUE: Contiguous axial images were obtained from the base of the skull through the vertex without intravenous contrast. COMPARISON:  MRI dated 08/12/2018 FINDINGS: Brain: Diffuse cerebral cortical and cerebellar atrophy with minimal dilatation of the ventricles consistent with the atrophy. No hemorrhage or infarction or mass lesion. Minimal periventricular white matter lucency consistent with chronic small vessel ischemic disease. Vascular: No hyperdense vessel or unexpected calcification. Skull: Normal. Negative for fracture or focal lesion. Sinuses/Orbits: No acute abnormality. Chronic opacification of the portion of the left side of the frontal sinus. Other: None IMPRESSION: No acute intracranial abnormality. Atrophy with minimal chronic small vessel ischemic changes. Electronically Signed   By: Lorriane Shire M.D.   On: 09/08/2018 20:30   Ct Abdomen Pelvis W  Contrast  Result Date: 09/08/2018 CLINICAL DATA:  Pain. EXAM: CT ABDOMEN AND PELVIS WITH CONTRAST TECHNIQUE: Multidetector CT imaging of the abdomen and pelvis was performed using the standard protocol following bolus administration of intravenous contrast. CONTRAST:  128mL OMNIPAQUE IOHEXOL 300 MG/ML  SOLN COMPARISON:  None. FINDINGS: Lower chest: Moderate hiatal hernia. Aortic atherosclerosis. Borderline cardiomegaly. Hepatobiliary: The liver appears normal. The gallbladder is distended with numerous stones and sludge in the gallbladder. Edema of the gallbladder wall. No dilated bile ducts. Slight pericholecystic soft tissue stranding. Pancreas: Unremarkable. No pancreatic ductal dilatation or surrounding inflammatory changes. Spleen: Normal in size without focal abnormality. Adrenals/Urinary Tract: Numerous bilateral renal cysts of various densities. The largest cyst is in the mid left kidney measuring 11.7 cm in diameter. There is a lobulated 4.6 cm dense cyst on the lower pole the left kidney which does not enhance on delayed imaging. There are similar dense cysts in the right kidney. No hydronephrosis. There is marked distention of the bladder with enlargement of the median lobe of the prostate gland. There are least 2 stones in the dependent portion of the bladder. There are diverticula in the dome of the bladder. Stomach/Bowel: Moderate hiatal hernia. No dilated large or small bowel. The appendix is not discretely identified. Vascular/Lymphatic: Aortic atherosclerosis. No enlarged abdominal or pelvic lymph nodes. Reproductive: Enlarged median lobe of the prostate gland. Other: No abdominal wall hernia or abnormality. No abdominopelvic ascites. Musculoskeletal: No acute abnormality. Severe arthritis of the left hip. Diffuse degenerative disc disease in the lumbar spine. Fusion of the left SI joint. IMPRESSION: 1. Distended gallbladder with numerous stones and sludge in the gallbladder. Edema of the  gallbladder wall with slight pericholecystic soft tissue stranding. The findings are consistent with acute cholecystitis. 2. Marked chronic or recurrent distention of the bladder with diverticula in the dome of the bladder, chronic. 3. Multiple bilateral renal cysts of various densities. 4. Moderate hiatal hernia. Aortic Atherosclerosis (ICD10-I70.0). Electronically Signed   By: Lorriane Shire M.D.   On: 09/08/2018 20:27    ____________________________________________   PROCEDURES  Procedure(s) performed (including Critical Care):  Procedures   ____________________________________________   INITIAL IMPRESSION / ASSESSMENT AND PLAN / ED COURSE  KHAIRO ALONGI was evaluated in Emergency Department on 09/08/2018 for the symptoms described in the history of present illness. He was evaluated in the context of the global COVID-19 pandemic, which necessitated consideration that the patient might be at risk for infection with the SARS-CoV-2 virus that causes COVID-19. Institutional  protocols and algorithms that pertain to the evaluation of patients at risk for COVID-19 are in a state of rapid change based on information released by regulatory bodies including the CDC and federal and state organizations. These policies and algorithms were followed during the patient's care in the ED.     Patient with abdominal pain.  CT shows what appears to be acute cholecystitis as well as urinary retention.  We will get a Foley in.  The surgeon will come down and evaluate him in consult but we have to get the possibility of stroke straightened out first.  I have ordered an MRI.  Hospitalist will come down see him quickly.  I will get him some antibiotics for his apparent cholecystitis.         ____________________________________________   FINAL CLINICAL IMPRESSION(S) / ED DIAGNOSES  Final diagnoses:  Altered mental status, unspecified altered mental status type  Generalized abdominal pain  Urinary  retention  Cholecystitis     ED Discharge Orders    None       Note:  This document was prepared using Dragon voice recognition software and may include unintentional dictation errors.    Nena Polio, MD 09/08/18 2131

## 2018-09-08 NOTE — ED Notes (Signed)
This edt has helped pt onto bedpan

## 2018-09-08 NOTE — ED Notes (Signed)
Pt assisted with urinal. Pt unable to void at this time. Pt bladder scanned, only showing 74mL at this time. MD informed. Pt to be given fluids.

## 2018-09-08 NOTE — H&P (Signed)
[facility] NAME: Jad Mamone    MR#:  [id number]  DATE OF BIRTH:  1934/03/22  DATE OF ADMISSION:  09/08/2018  PRIMARY CARE PHYSICIAN: [name], DO   REQUESTING/REFERRING PHYSICIAN: Malinda  CHIEF COMPLAINT:   Chief Complaint  Patient presents with  . Leg Pain  . Altered Mental Status    HISTORY OF PRESENT ILLNESS: Jailon Casique  is a 84 y.o. male with a known history of gastroesophageal reflux disease, hyperlipidemia, hypertension, osteoporosis, some dementia-was completely back to his baseline.  He walks with a walker and has some word finding difficulties intermittently.  In the morning he walked to mailbox and came back and after few minutes wife had noticed him more confused or having difficulty in talking. Patient denied any complaint of pain or fever.  He was brought to emergency room and on CT scan head he was negative but CT abdomen showed possibility of acute cholecystitis with some distention of his bladder. Patient's daughter was present in the room who also helped in the details.  PAST MEDICAL HISTORY:   Past Medical History:  Diagnosis Date  . GERD (gastroesophageal reflux disease)   . Hyperlipidemia   . Hypertension   . Osteoporosis     PAST SURGICAL HISTORY:  Past Surgical History:  Procedure Laterality Date  . GANGLION CYST EXCISION Left 2013   Left upper arm  . HERNIA REPAIR N/A 2008   MIdline, ventral acquired hernia repair    SOCIAL HISTORY:  Social History   Tobacco Use  . Smoking status: Former Smoker    Packs/day: 1.00    Years: 12.00    Pack years: 12.00    Types: Cigarettes    Quit date: 1968    Years since quitting: 52.7  . Smokeless tobacco: Former Network engineer Use Topics  . Alcohol use: No    FAMILY HISTORY:  Family History  Problem Relation Age of Onset  . Breast cancer Mother   . Lung cancer Father   . Heart disease Brother   . Leukemia Brother   . Prostate  cancer Neg Hx   . Colon cancer Neg Hx   . Bladder Cancer Neg Hx   . Kidney cancer Neg Hx     DRUG ALLERGIES:  Allergies  Allergen Reactions  . Antihistamines, Chlorpheniramine-Type Other (See Comments)    Can't urinate    REVIEW OF SYSTEMS:   CONSTITUTIONAL: No fever, fatigue or weakness.  EYES: No blurred or double vision.  EARS, NOSE, AND THROAT: No tinnitus or ear pain.  RESPIRATORY: No cough, shortness of breath, wheezing or hemoptysis.  CARDIOVASCULAR: No chest pain, orthopnea, edema.  GASTROINTESTINAL: No nausea, vomiting, diarrhea or abdominal pain.  GENITOURINARY: No dysuria, hematuria.  ENDOCRINE: No polyuria, nocturia,  HEMATOLOGY: No anemia, easy bruising or bleeding SKIN: No rash or lesion. MUSCULOSKELETAL: No joint pain or arthritis.   NEUROLOGIC: No tingling, numbness, weakness.  PSYCHIATRY: No anxiety or depression.   MEDICATIONS AT HOME:  Prior to Admission medications   Medication Sig Start Date End Date Taking? Authorizing Provider  aspirin EC 81 MG tablet Take 81 mg by mouth daily.   Yes [provider]  carbidopa-levodopa (SINEMET IR) 25-100 MG tablet Take 1 tablet by mouth 3 (three) times daily. 07/29/18 10/27/18 Yes [provider]  cholecalciferol (VITAMIN D) 1000 units tablet Take 1,000 Units by mouth daily.   Yes [provider]  Coenzyme Q10 (CO Q 10) 100 MG CAPS  Take 1 capsule by mouth daily.    Yes [provider]  enalapril (VASOTEC) 20 MG tablet TAKE 1 TABLET BY MOUTH TWICE A DAY 06/26/18  Yes Karamalegos, Alexander J, DO  fluticasone (FLONASE) 50 MCG/ACT nasal spray Place 2 sprays into both nostrils daily.    Yes [provider]  hydrochlorothiazide (HYDRODIURIL) 12.5 MG tablet Take 1 tablet (12.5 mg total) by mouth daily. 08/19/18  Yes Karamalegos, Devonne Doughty, DO  meloxicam (MOBIC) 15 MG tablet TAKE 1 TABLET (15 MG TOTAL) BY MOUTH DAILY AS NEEDED (MODERATE BACK PAIN). 07/07/18  Yes Karamalegos, Devonne Doughty, DO  Omega-3 Fatty Acids (FISH OIL) 1200 MG CAPS Take by mouth.   Yes [provider]  vitamin C (ASCORBIC ACID) 500 MG tablet Take 500 mg by mouth daily.   Yes [provider]  furosemide (LASIX) 20 MG tablet Take 1 tablet (20 mg total) by mouth daily as needed for edema. For 1 week take 2 pills in morning then resume normal dose. Patient not taking: Reported on 06/17/2018 07/31/16   [name], DO      PHYSICAL EXAMINATION:   VITAL SIGNS: Blood pressure 137/85, pulse (!) 103, resp. rate (!) 30, height 6' (1.829 m), weight 90.7 kg, SpO2 92 %.  GENERAL:  84 y.o.-year-old patient lying in the bed with no acute distress.  EYES: Pupils equal, round, reactive to light and accommodation. No scleral icterus. Extraocular muscles intact.  HEENT: Head atraumatic, normocephalic. Oropharynx and nasopharynx clear.  NECK:  Supple, no jugular venous distention. No thyroid enlargement, no tenderness.  LUNGS: Normal breath sounds bilaterally, no wheezing, rales,rhonchi or crepitation. No use of accessory muscles of respiration.  CARDIOVASCULAR: S1, S2 normal. No murmurs, rubs, or gallops.  ABDOMEN: Soft, mild right upper quadrant tender, nondistended. Bowel sounds present. No organomegaly or mass.  EXTREMITIES: No pedal edema, cyanosis, or clubbing.  NEUROLOGIC: Cranial nerves II through XII are intact. Muscle strength 5/5 in all extremities. Sensation intact. Gait not checked.  Speaks few words and appears answering question satisfactorily. PSYCHIATRIC: The patient is alert and oriented x 3.  SKIN: No obvious rash, lesion, or ulcer.   LABORATORY PANEL:   CBC Recent Labs  Lab 09/08/18 1833  WBC 13.0*  HGB 14.1  HCT 42.0  PLT 209  MCV 87.0  MCH 29.2  MCHC 33.6  RDW 13.9  LYMPHSABS 0.8  MONOABS 0.8  EOSABS 0.0  BASOSABS 0.0   ------------------------------------------------------------------------------------------------------------------  Chemistries   Recent Labs  Lab 09/08/18 1833  NA 138  K 3.8  CL 97*  CO2 30  GLUCOSE 117*  BUN 26*  CREATININE 0.99  CALCIUM 9.0  AST 24  ALT 11  ALKPHOS 70  BILITOT 1.3*   ------------------------------------------------------------------------------------------------------------------ estimated creatinine clearance is 63.1 mL/min (by C-G formula based on SCr of 0.99 mg/dL). ------------------------------------------------------------------------------------------------------------------ No results for input(s): TSH, T4TOTAL, T3FREE, THYROIDAB in the last 72 hours.  Invalid input(s): FREET3   Coagulation profile No results for input(s): INR, PROTIME in the last 168 hours. ------------------------------------------------------------------------------------------------------------------- No results for input(s): DDIMER in the last 72 hours. -------------------------------------------------------------------------------------------------------------------  Cardiac Enzymes No results for input(s): CKMB, TROPONINI, MYOGLOBIN in the last 168 hours.  Invalid input(s): CK ------------------------------------------------------------------------------------------------------------------ Invalid input(s): POCBNP  ---------------------------------------------------------------------------------------------------------------  Urinalysis    Component Value Date/Time   COLORURINE YELLOW (A) 05/07/2017 2329   APPEARANCEUR HAZY (A) 05/07/2017 2329   APPEARANCEUR Clear 08/15/2016 1311   LABSPEC 1.012 05/07/2017 2329   PHURINE 6.0 05/07/2017 2329   GLUCOSEU NEGATIVE 05/07/2017 2329  HGBUR LARGE (A) 05/07/2017 2329   BILIRUBINUR NEGATIVE 05/07/2017 2329   BILIRUBINUR Negative 08/15/2016 1311   BILIRUBINUR negative 04/17/2015 0826   KETONESUR NEGATIVE 05/07/2017 2329   PROTEINUR NEGATIVE 05/07/2017 2329   UROBILINOGEN 0.2 05/30/2016 1325   UROBILINOGEN Normal 04/17/2015 0826   NITRITE  NEGATIVE 05/07/2017 2329   LEUKOCYTESUR NEGATIVE 05/07/2017 2329   LEUKOCYTESUR Negative 08/15/2016 1311   LEUKOCYTESUR negative 04/17/2015 0826     RADIOLOGY: Ct Head Wo Contrast  Result Date: 09/08/2018 CLINICAL DATA:  Altered mental status. EXAM: CT HEAD WITHOUT CONTRAST TECHNIQUE: Contiguous axial images were obtained from the base of the skull through the vertex without intravenous contrast. COMPARISON:  MRI dated 08/12/2018 FINDINGS: Brain: Diffuse cerebral cortical and cerebellar atrophy with minimal dilatation of the ventricles consistent with the atrophy. No hemorrhage or infarction or mass lesion. Minimal periventricular white matter lucency consistent with chronic small vessel ischemic disease. Vascular: No hyperdense vessel or unexpected calcification. Skull: Normal. Negative for fracture or focal lesion. Sinuses/Orbits: No acute abnormality. Chronic opacification of the portion of the left side of the frontal sinus. Other: None IMPRESSION: No acute intracranial abnormality. Atrophy with minimal chronic small vessel ischemic changes. Electronically Signed   By: Lorriane Shire M.D.   On: 09/08/2018 20:30   Ct Abdomen Pelvis W Contrast  Result Date: 09/08/2018 CLINICAL DATA:  Pain. EXAM: CT ABDOMEN AND PELVIS WITH CONTRAST TECHNIQUE: Multidetector CT imaging of the abdomen and pelvis was performed using the standard protocol following bolus administration of intravenous contrast. CONTRAST:  176mL OMNIPAQUE IOHEXOL 300 MG/ML  SOLN COMPARISON:  None. FINDINGS: Lower chest: Moderate hiatal hernia. Aortic atherosclerosis. Borderline cardiomegaly. Hepatobiliary: The liver appears normal. The gallbladder is distended with numerous stones and sludge in the gallbladder. Edema of the gallbladder wall. No dilated bile ducts. Slight pericholecystic soft tissue stranding. Pancreas: Unremarkable. No pancreatic ductal dilatation or surrounding inflammatory changes. Spleen: Normal in size without focal  abnormality. Adrenals/Urinary Tract: Numerous bilateral renal cysts of various densities. The largest cyst is in the mid left kidney measuring 11.7 cm in diameter. There is a lobulated 4.6 cm dense cyst on the lower pole the left kidney which does not enhance on delayed imaging. There are similar dense cysts in the right kidney. No hydronephrosis. There is marked distention of the bladder with enlargement of the median lobe of the prostate gland. There are least 2 stones in the dependent portion of the bladder. There are diverticula in the dome of the bladder. Stomach/Bowel: Moderate hiatal hernia. No dilated large or small bowel. The appendix is not discretely identified. Vascular/Lymphatic: Aortic atherosclerosis. No enlarged abdominal or pelvic lymph nodes. Reproductive: Enlarged median lobe of the prostate gland. Other: No abdominal wall hernia or abnormality. No abdominopelvic ascites. Musculoskeletal: No acute abnormality. Severe arthritis of the left hip. Diffuse degenerative disc disease in the lumbar spine. Fusion of the left SI joint. IMPRESSION: 1. Distended gallbladder with numerous stones and sludge in the gallbladder. Edema of the gallbladder wall with slight pericholecystic soft tissue stranding. The findings are consistent with acute cholecystitis. 2. Marked chronic or recurrent distention of the bladder with diverticula in the dome of the bladder, chronic. 3. Multiple bilateral renal cysts of various densities. 4. Moderate hiatal hernia. Aortic Atherosclerosis (ICD10-I70.0). Electronically Signed   By: Lorriane Shire M.D.   On: 09/08/2018 20:27    EKG: Orders placed or performed during the hospital encounter of 09/08/18  . EKG 12-Lead  . EKG 12-Lead  . ED EKG  . ED EKG  IMPRESSION AND PLAN:  *Acute cholecystitis Appreciate surgical consult. Currently IV antibiotics and nausea and pain medication as needed. They suggested to rule out acute stroke and if that is ruled out then most  likely might proceed for cholecystectomy.  *Altered mental status and word finding difficulty Patient has some baseline dementia and word finding problem. This is significantly worse today as per daughter. This all could be just due to acute infection. We will do an MRI brain to rule out acute stroke.  *Bladder distention Patient might have underlying benign prostatic hypertrophy. I will start on Flomax. Advised to do bladder scan every 8 hours and if more than urine retention then do in and out catheter.  *Parkinson's disease Continue Sinemet.  *Hypertension Continue enalapril, hydrochlorothiazide.  All the records are reviewed and case discussed with ED provider. Management plans discussed with the patient, family and they are in agreement.  CODE STATUS:Full.    TOTAL TIME TAKING CARE OF THIS PATIENT: 50 minutes.   Patient's daughter was present in the room during my visit.  Vaughan Basta M.D on 09/08/2018   Between 7am to 6pm - Pager - [phone]  After 6pm go to [email or web address] - password [email or web address]  Office  [phone]  CC: Primary care physician; [name], DO   Note: This dictation was prepared with Dragon dictation along with smaller phrase technology. Any transcriptional errors that result from this process are unintentional.

## 2018-09-08 NOTE — ED Notes (Signed)
Pt alert  Family in with pt.  Pt waiting for admission .  nsro on monitor.

## 2018-09-08 NOTE — ED Notes (Signed)
Patient transported to CT 

## 2018-09-08 NOTE — ED Notes (Signed)
Pt's daughter at bedside and states pt has had more weakness than normal. Daughter also states pt had trouble earlier finding his words and speaking. Pt not showing any of these signs at this time. Pt alert and oriented and answering all questions appropriately.

## 2018-09-08 NOTE — ED Notes (Signed)
Resumed care from samantha rn.  Pt in mri.

## 2018-09-08 NOTE — ED Notes (Signed)
Pt assisted with urinal unable to provide specimen at this time. Pt had wet underpants on. Pt cleaned up and repositioned at this time

## 2018-09-09 LAB — CBC
HCT: 39.4 % (ref 39.0–52.0)
Hemoglobin: 13.4 g/dL (ref 13.0–17.0)
MCH: 29.4 pg (ref 26.0–34.0)
MCHC: 34 g/dL (ref 30.0–36.0)
MCV: 86.4 fL (ref 80.0–100.0)
Platelets: 193 10*3/uL (ref 150–400)
RBC: 4.56 MIL/uL (ref 4.22–5.81)
RDW: 14.2 % (ref 11.5–15.5)
WBC: 18.1 10*3/uL — ABNORMAL HIGH (ref 4.0–10.5)
nRBC: 0 % (ref 0.0–0.2)

## 2018-09-09 LAB — BASIC METABOLIC PANEL
Anion gap: 11 (ref 5–15)
BUN: 22 mg/dL (ref 8–23)
CO2: 27 mmol/L (ref 22–32)
Calcium: 8.4 mg/dL — ABNORMAL LOW (ref 8.9–10.3)
Chloride: 99 mmol/L (ref 98–111)
Creatinine, Ser: 1.01 mg/dL (ref 0.61–1.24)
GFR calc Af Amer: >60 mL/min (ref >60)
GFR calc non Af Amer: >60 mL/min (ref >60)
Glucose, Bld: 101 mg/dL — ABNORMAL HIGH (ref 70–99)
Potassium: 3.2 mmol/L — ABNORMAL LOW (ref 3.5–5.1)
Sodium: 137 mmol/L (ref 135–145)

## 2018-09-09 LAB — HEPATIC FUNCTION PANEL
ALT: 17 U/L (ref 0–44)
AST: 25 U/L (ref 15–41)
Albumin: 3.1 g/dL — ABNORMAL LOW (ref 3.5–5.0)
Alkaline Phosphatase: 51 U/L (ref 38–126)
Bilirubin, Direct: 0.3 mg/dL — ABNORMAL HIGH (ref 0.0–0.2)
Indirect Bilirubin: 1.7 mg/dL — ABNORMAL HIGH (ref 0.3–0.9)
Total Bilirubin: 2 mg/dL — ABNORMAL HIGH (ref 0.3–1.2)
Total Protein: 6.4 g/dL — ABNORMAL LOW (ref 6.5–8.1)

## 2018-09-09 MED ORDER — ASPIRIN EC 81 MG PO TBEC
81.0000 mg | DELAYED_RELEASE_TABLET | Freq: Every day | ORAL | Status: DC
  Filled 2018-09-09: qty 1

## 2018-09-09 MED ORDER — VITAMIN C 500 MG PO TABS
500.0000 mg | ORAL_TABLET | Freq: Every day | ORAL | Status: DC
  Administered 2018-09-09 – 2018-09-11 (×3): 500 mg via ORAL
  Filled 2018-09-09 (×3): qty 1

## 2018-09-09 MED ORDER — HYDROCHLOROTHIAZIDE 25 MG PO TABS
12.5000 mg | ORAL_TABLET | Freq: Every day | ORAL | Status: DC
  Administered 2018-09-09 – 2018-09-11 (×3): 12.5 mg via ORAL
  Filled 2018-09-09 (×2): qty 1
  Filled 2018-09-09: qty 0.5

## 2018-09-09 MED ORDER — CARBIDOPA-LEVODOPA 25-100 MG PO TABS
1.0000 | ORAL_TABLET | Freq: Three times a day (TID) | ORAL | Status: DC
  Administered 2018-09-09 – 2018-09-11 (×6): 1 via ORAL
  Filled 2018-09-09 (×9): qty 1

## 2018-09-09 MED ORDER — ENALAPRIL MALEATE 20 MG PO TABS
20.0000 mg | ORAL_TABLET | Freq: Two times a day (BID) | ORAL | Status: DC
  Administered 2018-09-09 – 2018-09-11 (×5): 20 mg via ORAL
  Filled 2018-09-09 (×6): qty 1

## 2018-09-09 MED ORDER — VITAMIN D3 25 MCG (1000 UNIT) PO TABS
1000.0000 [IU] | ORAL_TABLET | Freq: Every day | ORAL | Status: DC
  Administered 2018-09-09 – 2018-09-11 (×3): 1000 [IU] via ORAL
  Filled 2018-09-09 (×5): qty 1

## 2018-09-09 MED ORDER — ORAL CARE MOUTH RINSE
15.0000 mL | Freq: Two times a day (BID) | OROMUCOSAL | Status: DC
  Administered 2018-09-10 – 2018-09-11 (×3): 15 mL via OROMUCOSAL

## 2018-09-09 MED ORDER — CO Q 10 100 MG PO CAPS: 1.0000 | ORAL_CAPSULE | Freq: Every day | ORAL | Status: DC

## 2018-09-09 MED ORDER — POTASSIUM CHLORIDE CRYS ER 20 MEQ PO TBCR
40.0000 meq | EXTENDED_RELEASE_TABLET | ORAL | Status: AC
  Administered 2018-09-09 (×2): 40 meq via ORAL
  Filled 2018-09-09 (×2): qty 2

## 2018-09-09 MED ORDER — OMEGA-3-ACID ETHYL ESTERS 1 G PO CAPS
1.0000 g | ORAL_CAPSULE | Freq: Every day | ORAL | Status: DC
  Administered 2018-09-09 – 2018-09-11 (×3): 1 g via ORAL
  Filled 2018-09-09 (×3): qty 1

## 2018-09-09 MED ORDER — FLUTICASONE PROPIONATE 50 MCG/ACT NA SUSP
2.0000 | Freq: Every day | NASAL | Status: DC
  Administered 2018-09-11: 2 via NASAL
  Filled 2018-09-09: qty 16

## 2018-09-09 MED ORDER — PIPERACILLIN-TAZOBACTAM 3.375 G IVPB
3.3750 g | Freq: Three times a day (TID) | INTRAVENOUS | Status: DC
  Administered 2018-09-09 – 2018-09-11 (×7): 3.375 g via INTRAVENOUS
  Filled 2018-09-09 (×11): qty 50

## 2018-09-09 MED ORDER — HEPARIN SODIUM (PORCINE) 5000 UNIT/ML IJ SOLN
5000.0000 [IU] | Freq: Three times a day (TID) | INTRAMUSCULAR | Status: DC
  Administered 2018-09-09: 5000 [IU] via SUBCUTANEOUS
  Filled 2018-09-09 (×3): qty 1

## 2018-09-09 MED ORDER — DOCUSATE SODIUM 100 MG PO CAPS: 100.0000 mg | ORAL_CAPSULE | Freq: Two times a day (BID) | ORAL | Status: DC | PRN

## 2018-09-09 NOTE — ED Notes (Signed)
Pt resting quietly on stretcher in darkened exam room with no distress noted; daughter at bedside; cherry-tinged urine noted in tubing and foley bag with dried blood around urethral opening, tubing pulled taut; bladder scan reveals 61ml urine and pt denies discomfort; blood clensed from penis and scrotum, foley leg adhesive repositioned; tubing flushed with NS, several small clots removed and foley now draining clear; pt repos in bed for comfort

## 2018-09-09 NOTE — Consult Note (Signed)
PHARMACY CONSULT NOTE - FOLLOW UP  Pharmacy Consult for Electrolyte Monitoring and Replacement   Recent Labs: Potassium (mmol/L)  Date Value  09/09/2018 3.2 (L)  04/17/2015 4.0   Calcium (mg/dL)  Date Value  09/09/2018 8.4 (L)  04/17/2015 8.5 (A)   Albumin (g/dL)  Date Value  09/09/2018 3.1 (L)   Albumin Serum (no units)  Date Value  04/17/2015 3.9   Sodium  Date Value  09/09/2018 137 mmol/L  04/17/2015 143     Assessment: Pharmacy consulted to monitor and replace Electrolytes in 84yo patient.  Goal of Therapy:  Electrolytes WNL  Plan:  K 3.2. Will replace with KCl 21mEq PO x 2 doses.   Will recheck electrolytes, including Magnesium and Phosphorus tomorrow with AM labs.  Pearla Dubonnet ,PharmD Clinical Pharmacist 09/09/2018 9:59 AM

## 2018-09-09 NOTE — ED Notes (Signed)
ED TO INPATIENT HANDOFF REPORT  ED Nurse Name and Phone #: Caryl Pina [phone]  S Name/Age/Gender Hunt Oris Wisnewski 84 y.o. male Room/Bed: ED32A/ED32A  Code Status   Code Status: Full Code  Home/SNF/Other Home Patient oriented to: self, place, time and situation Is this baseline? Yes   Triage Complete: Triage complete  Chief Complaint Weakness  Triage Note EMS called to pt home for leg weakness and AMS. Pt has history of dementia but daughter states pt is slightly more confused than normal. Daughter states patient had PT yesterday for leg weakness and he may be sore from activity. Also reports pt urinating more than normal. Stomach distended but soft and nontender.  Hx of hypertension and dementia.  BP 122/84 BS 116 O2 97% on room air T 98.1 oral     Allergies Allergies  Allergen Reactions  . Antihistamines, Chlorpheniramine-Type Other (See Comments)    Can't urinate    Level of Care/Admitting Diagnosis ED Disposition    ED Disposition Condition [facility] Area: [place] [100120]  Level of Care: Med-Surg [16]  Covid Evaluation: Asymptomatic Screening Protocol (No Symptoms)  Diagnosis: Acute cholecystitis [575.0.ICD-9-CM]  Admitting Physician: Vaughan Basta [id number]  Attending Physician: Vaughan Basta [phone]  Estimated length of stay: past midnight tomorrow  Certification:: I certify this patient will need inpatient services for at least 2 midnights  PT Class (Do Not Modify): Inpatient [101]  PT Acc Code (Do Not Modify): Private [1]       B Medical/Surgery History Past Medical History:  Diagnosis Date  . GERD (gastroesophageal reflux disease)   . Hyperlipidemia   . Hypertension   . Osteoporosis    Past Surgical History:  Procedure Laterality Date  . GANGLION CYST EXCISION Left 2013   Left upper arm  . HERNIA REPAIR N/A 2008   MIdline, ventral acquired hernia repair     A IV  Location/Drains/Wounds Patient Lines/Drains/Airways Status   Active Line/Drains/Airways    Name:   Placement date:   Placement time:   Site:   Days:   Peripheral IV 09/08/18 Left Arm   09/08/18    1832    Arm   1   Peripheral IV 09/08/18 Right Arm   09/08/18    2215    Arm   1   Urethral Catheter alyssa rn Double-lumen;Non-latex 16 Fr.   09/08/18    2216    Double-lumen;Non-latex   1          Intake/Output Last 24 hours  Intake/Output Summary (Last 24 hours) at 09/09/2018 1425 Last data filed at 09/08/2018 2241 Gross per 24 hour  Intake -  Output 1600 ml  Net -1600 ml    Labs/Imaging Results for orders placed or performed during the hospital encounter of 09/08/18 (from the past 48 hour(s))  Comprehensive metabolic panel     Status: Abnormal   Collection Time: 09/08/18  6:33 PM  Result Value Ref Range   Sodium 138 135 - 145 mmol/L   Potassium 3.8 3.5 - 5.1 mmol/L   Chloride 97 (L) 98 - 111 mmol/L   CO2 30 22 - 32 mmol/L   Glucose, Bld 117 (H) 70 - 99 mg/dL   BUN 26 (H) 8 - 23 mg/dL   Creatinine, Ser 0.99 0.61 - 1.24 mg/dL   Calcium 9.0 8.9 - 10.3 mg/dL   Total Protein 7.5 6.5 - 8.1 g/dL   Albumin 3.9 3.5 - 5.0 g/dL   AST 24 15 - 41  U/L   ALT 11 0 - 44 U/L   Alkaline Phosphatase 70 38 - 126 U/L   Total Bilirubin 1.3 (H) 0.3 - 1.2 mg/dL   GFR calc non Af Amer >60 >60 mL/min   GFR calc Af Amer >60 >60 mL/min   Anion gap 11 5 - 15    Comment: Performed at [facility], [address] [place], [place] [address]  Lipase, blood     Status: None   Collection Time: 09/08/18  6:33 PM  Result Value Ref Range   Lipase 30 11 - 51 U/L    Comment: Performed at [facility], [place], [place] [address]  Troponin I (High Sensitivity)     Status: None   Collection Time: 09/08/18  6:33 PM  Result Value Ref Range   Troponin I (High Sensitivity) 10 <18 ng/L    Comment: (NOTE) Elevated high sensitivity troponin I (hsTnI) values and significant  changes  across serial measurements may suggest ACS but many other  chronic and acute conditions are known to elevate hsTnI results.  Refer to the "Links" section for chest pain algorithms and additional  guidance. Performed at [facility], [address] [place], [place] [address]   CBC with Differential     Status: Abnormal   Collection Time: 09/08/18  6:33 PM  Result Value Ref Range   WBC 13.0 (H) 4.0 - 10.5 K/uL   RBC 4.83 4.22 - 5.81 MIL/uL   Hemoglobin 14.1 13.0 - 17.0 g/dL   HCT 42.0 39.0 - 52.0 %   MCV 87.0 80.0 - 100.0 fL   MCH 29.2 26.0 - 34.0 pg   MCHC 33.6 30.0 - 36.0 g/dL   RDW 13.9 11.5 - 15.5 %   Platelets 209 150 - 400 K/uL   nRBC 0.0 0.0 - 0.2 %   Neutrophils Relative % 88 %   Neutro Abs 11.3 (H) 1.7 - 7.7 K/uL   Lymphocytes Relative 6 %   Lymphs Abs 0.8 0.7 - 4.0 K/uL   Monocytes Relative 6 %   Monocytes Absolute 0.8 0.1 - 1.0 K/uL   Eosinophils Relative 0 %   Eosinophils Absolute 0.0 0.0 - 0.5 K/uL   Basophils Relative 0 %   Basophils Absolute 0.0 0.0 - 0.1 K/uL   Immature Granulocytes 0 %   Abs Immature Granulocytes 0.05 0.00 - 0.07 K/uL    Comment: Performed at [facility], [address] [place], [place] [address]  Lactic acid, plasma     Status: None   Collection Time: 09/08/18  6:35 PM  Result Value Ref Range   Lactic Acid, Venous 0.9 0.5 - 1.9 mmol/L    Comment: Performed at [facility] [place], [place] [address]  Troponin I (High Sensitivity)     Status: None   Collection Time: 09/08/18  8:35 PM  Result Value Ref Range   Troponin I (High Sensitivity) 10 <18 ng/L    Comment: (NOTE) Elevated high sensitivity troponin I (hsTnI) values and significant  changes across serial measurements may suggest ACS but many other  chronic and acute conditions are known to elevate hsTnI results.  Refer to the "Links" section for chest pain algorithms and additional  guidance. Performed at [facility], [address], [place], [place] [address]   SARS Coronavirus 2 [facility] order, Performed in [facility] hospital lab) Nasopharyngeal Nasopharyngeal Swab     Status: None   Collection Time: 09/08/18  9:45 PM   Specimen: Nasopharyngeal Swab  Result Value Ref Range   SARS Coronavirus 2 NEGATIVE NEGATIVE    Comment: (NOTE) If result is NEGATIVE SARS-CoV-2 target nucleic acids are NOT DETECTED. The SARS-CoV-2 RNA is generally detectable in upper and lower  respiratory specimens during the acute phase of infection. The lowest  concentration of SARS-CoV-2 viral copies this assay can detect is 250  copies / mL. A negative result does not preclude SARS-CoV-2 infection  and should not be used as the sole basis for treatment or other  patient management decisions.  A negative result may occur with  improper specimen collection / handling, submission of specimen other  than nasopharyngeal swab, presence of viral mutation(s) within the  areas targeted by this assay, and inadequate number of viral copies  (<250 copies / mL). A negative result must be combined with clinical  observations, patient history, and epidemiological information. If result is POSITIVE SARS-CoV-2 target nucleic acids are DETECTED. The SARS-CoV-2 RNA is generally detectable in upper and lower  respiratory specimens dur ing the acute phase of infection.  Positive  results are indicative of active infection with SARS-CoV-2.  Clinical  correlation with patient history and other diagnostic information is  necessary to determine patient infection status.  Positive results do  not rule out bacterial infection or co-infection with other viruses. If result is PRESUMPTIVE POSTIVE SARS-CoV-2 nucleic acids MAY BE PRESENT.   A presumptive positive result was obtained on the submitted specimen  and confirmed on repeat testing.  While 2019 novel coronavirus  (SARS-CoV-2) nucleic acids may be present in the submitted sample  additional  confirmatory testing may be necessary for epidemiological  and / or clinical management purposes  to differentiate between  SARS-CoV-2 and other Sarbecovirus currently known to infect humans.  If clinically indicated additional testing with an alternate test  methodology [phone]) is advised. The SARS-CoV-2 RNA is generally  detectable in upper and lower respiratory sp ecimens during the acute  phase of infection. The expected result is Negative. Fact Sheet for Patients:  StrictlyIdeas.no Fact Sheet for Healthcare Providers: BankingDealers.co.za This test is not yet approved or cleared by the [place] FDA and has been authorized for detection and/or diagnosis of SARS-CoV-2 by FDA under an Emergency Use Authorization (EUA).  This EUA will remain in effect (meaning this test can be used) for the duration of the COVID-19 declaration under Section 564(b)(1) of the Act, 21 U.S.C. section 360bbb-3(b)(1), unless the authorization is terminated or revoked sooner. Performed at [facility], [address] [place], [place] [address]   Culture, blood (single) w Reflex to ID Panel     Status: None (Preliminary result)   Collection Time: 09/08/18  9:46 PM   Specimen: BLOOD  Result Value Ref Range   Specimen Description BLOOD LEFT ASSIST CONTROL    Special Requests      BOTTLES DRAWN AEROBIC AND ANAEROBIC Blood Culture adequate volume   Culture      NO GROWTH < 12 HOURS Performed at [facility], [address] [place],  [address]    Report Status PENDING   Urinalysis, Complete w Microscopic     Status: Abnormal   Collection Time: 09/08/18 10:15 PM  Result Value Ref Range   Color, Urine YELLOW (A) YELLOW   APPearance CLEAR (A) CLEAR   Specific Gravity, Urine 1.017 1.005 - 1.030   pH 6.0 5.0 - 8.0   Glucose, UA NEGATIVE NEGATIVE mg/dL   Hgb urine dipstick  NEGATIVE NEGATIVE   Bilirubin Urine NEGATIVE NEGATIVE    Ketones, ur NEGATIVE NEGATIVE mg/dL   Protein, ur NEGATIVE NEGATIVE mg/dL   Nitrite NEGATIVE NEGATIVE   Leukocytes,Ua NEGATIVE NEGATIVE   RBC / HPF 0-5 0 - 5 RBC/hpf   WBC, UA 0-5 0 - 5 WBC/hpf   Bacteria, UA NONE SEEN NONE SEEN   Squamous Epithelial / LPF NONE SEEN 0 - 5   Mucus PRESENT     Comment: Performed at [facility], [address] [place], [place] [address]  CBC     Status: Abnormal   Collection Time: 09/09/18  4:38 AM  Result Value Ref Range   WBC 18.1 (H) 4.0 - 10.5 K/uL   RBC 4.56 4.22 - 5.81 MIL/uL   Hemoglobin 13.4 13.0 - 17.0 g/dL   HCT 39.4 39.0 - 52.0 %   MCV 86.4 80.0 - 100.0 fL   MCH 29.4 26.0 - 34.0 pg   MCHC 34.0 30.0 - 36.0 g/dL   RDW 14.2 11.5 - 15.5 %   Platelets 193 150 - 400 K/uL   nRBC 0.0 0.0 - 0.2 %    Comment: Performed at [facility], [address] [place], [place] [id number]  Basic metabolic panel     Status: Abnormal   Collection Time: 09/09/18  6:20 AM  Result Value Ref Range   Sodium 137 135 - 145 mmol/L   Potassium 3.2 (L) 3.5 - 5.1 mmol/L   Chloride 99 98 - 111 mmol/L   CO2 27 22 - 32 mmol/L   Glucose, Bld 101 (H) 70 - 99 mg/dL   BUN 22 8 - 23 mg/dL   Creatinine, Ser 1.01 0.61 - 1.24 mg/dL   Calcium 8.4 (L) 8.9 - 10.3 mg/dL   GFR calc non Af Amer >60 >60 mL/min   GFR calc Af Amer >60 >60 mL/min   Anion gap 11 5 - 15    Comment: Performed at [facility], [address] [place], [place] [address]  Hepatic function panel     Status: Abnormal   Collection Time: 09/09/18  6:27 AM  Result Value Ref Range   Total Protein 6.4 (L) 6.5 - 8.1 g/dL   Albumin 3.1 (L) 3.5 - 5.0 g/dL   AST 25 15 - 41 U/L   ALT 17 0 - 44 U/L   Alkaline Phosphatase 51 38 - 126 U/L   Total Bilirubin 2.0 (H) 0.3 - 1.2 mg/dL   Bilirubin, Direct 0.3 (H) 0.0 - 0.2 mg/dL   Indirect Bilirubin 1.7 (H) 0.3 - 0.9 mg/dL    Comment: Performed at [facility], [address] [place], [place] [address]   Ct Head Wo  Contrast  Result Date: 09/08/2018 CLINICAL DATA:  Altered mental status. EXAM: CT HEAD WITHOUT CONTRAST TECHNIQUE: Contiguous axial images were obtained from the base of the skull through the vertex without intravenous contrast. COMPARISON:  MRI dated 08/12/2018 FINDINGS: Brain: Diffuse cerebral cortical and cerebellar atrophy with minimal dilatation of the ventricles consistent with the atrophy. No hemorrhage or infarction or mass lesion. Minimal periventricular white matter lucency consistent with chronic small vessel ischemic disease. Vascular: No hyperdense vessel or unexpected calcification. Skull: Normal. Negative for fracture or focal lesion. Sinuses/Orbits: No acute abnormality. Chronic opacification of the portion of the left side of the frontal sinus. Other: None IMPRESSION: No acute intracranial abnormality. Atrophy with minimal chronic small vessel ischemic changes. Electronically Signed   By: Lorriane Shire M.D.   On: 09/08/2018  20:30   Mr Brain Wo Contrast  Result Date: 09/08/2018 CLINICAL DATA:  Initial evaluation for acute word-finding difficulty. EXAM: MRI HEAD WITHOUT CONTRAST TECHNIQUE: Multiplanar, multiecho pulse sequences of the brain and surrounding structures were obtained without intravenous contrast. COMPARISON:  Prior CT from earlier the same day as well as recent MRI from 08/12/2018. FINDINGS: Brain: Moderately advanced cerebral atrophy again noted. Scattered T2/FLAIR hyperintensity within the periventricular and deep white matter both cerebral hemispheres most consistent with chronic microvascular ischemic disease, mild to moderate in nature. Superimposed small remote lacunar infarct noted at the left caudate head. No abnormal foci of restricted diffusion to suggest acute or subacute ischemia. Gray-white matter differentiation maintained without evidence for remote cortical infarction. No foci of susceptibility artifact to suggest acute or chronic intracranial hemorrhage. No mass  lesion, midline shift or mass effect no hydrocephalus. No extra-axial fluid collection. Pituitary gland suprasellar region normal. Midline structures intact. Vascular: Major intracranial vascular flow voids are maintained. Skull and upper cervical spine: Craniocervical junction within normal limits. Visualized upper cervical spine normal. Bone marrow signal intensity within normal limits. No scalp soft tissue abnormality. Sinuses/Orbits: Patient status post bilateral ocular lens replacement. Heterogeneous T2 signal intensity filling the left frontal sinus with associated expansion and thinning of the underlying inner table, suggesting mucocele formation. Paranasal sinuses are otherwise clear. No significant mastoid effusion. Inner ear structures normal. Other: None. IMPRESSION: 1. No acute intracranial abnormality. 2. Moderately advanced cerebral atrophy with chronic microvascular ischemic disease, stable. 3. Probable left frontal sinus mucocele, stable. This could be further evaluated with dedicated sinus CT as clinically warranted, as there could be some risk for intracranial extension. This could be performed on a nonemergent outpatient basis. Electronically Signed   By: Jeannine Boga M.D.   On: 09/08/2018 23:28   Ct Abdomen Pelvis W Contrast  Result Date: 09/08/2018 CLINICAL DATA:  Pain. EXAM: CT ABDOMEN AND PELVIS WITH CONTRAST TECHNIQUE: Multidetector CT imaging of the abdomen and pelvis was performed using the standard protocol following bolus administration of intravenous contrast. CONTRAST:  139mL OMNIPAQUE IOHEXOL 300 MG/ML  SOLN COMPARISON:  None. FINDINGS: Lower chest: Moderate hiatal hernia. Aortic atherosclerosis. Borderline cardiomegaly. Hepatobiliary: The liver appears normal. The gallbladder is distended with numerous stones and sludge in the gallbladder. Edema of the gallbladder wall. No dilated bile ducts. Slight pericholecystic soft tissue stranding. Pancreas: Unremarkable. No  pancreatic ductal dilatation or surrounding inflammatory changes. Spleen: Normal in size without focal abnormality. Adrenals/Urinary Tract: Numerous bilateral renal cysts of various densities. The largest cyst is in the mid left kidney measuring 11.7 cm in diameter. There is a lobulated 4.6 cm dense cyst on the lower pole the left kidney which does not enhance on delayed imaging. There are similar dense cysts in the right kidney. No hydronephrosis. There is marked distention of the bladder with enlargement of the median lobe of the prostate gland. There are least 2 stones in the dependent portion of the bladder. There are diverticula in the dome of the bladder. Stomach/Bowel: Moderate hiatal hernia. No dilated large or small bowel. The appendix is not discretely identified. Vascular/Lymphatic: Aortic atherosclerosis. No enlarged abdominal or pelvic lymph nodes. Reproductive: Enlarged median lobe of the prostate gland. Other: No abdominal wall hernia or abnormality. No abdominopelvic ascites. Musculoskeletal: No acute abnormality. Severe arthritis of the left hip. Diffuse degenerative disc disease in the lumbar spine. Fusion of the left SI joint. IMPRESSION: 1. Distended gallbladder with numerous stones and sludge in the gallbladder. Edema of the gallbladder wall with  slight pericholecystic soft tissue stranding. The findings are consistent with acute cholecystitis. 2. Marked chronic or recurrent distention of the bladder with diverticula in the dome of the bladder, chronic. 3. Multiple bilateral renal cysts of various densities. 4. Moderate hiatal hernia. Aortic Atherosclerosis (ICD10-I70.0). Electronically Signed   By: Lorriane Shire M.D.   On: 09/08/2018 20:27    Pending Labs Unresulted Labs (From admission, onward)    Start     Ordered   09/10/18 0500  Comprehensive metabolic panel  Tomorrow morning,   STAT     09/09/18 0829   09/10/18 [id number]  Basic metabolic panel  Tomorrow morning,   STAT     09/09/18  1002   09/10/18 0500  Phosphorus  Tomorrow morning,   STAT     09/09/18 1002   09/10/18 0500  Magnesium  Tomorrow morning,   STAT     09/09/18 1002   09/10/18 [id number]  Basic metabolic panel  Tomorrow morning,   STAT     09/09/18 1154   09/10/18 0500  CBC  Tomorrow morning,   STAT     09/09/18 1154          Vitals/Pain Today's Vitals   09/09/18 0619 09/09/18 0725 09/09/18 1000 09/09/18 1008  BP:   138/86 138/86  Pulse:   81   Resp:   (!) 22   Temp: 98.4 F (36.9 C)     TempSrc: Oral     SpO2:   90%   Weight:      Height:      PainSc:  Asleep 0-No pain     Isolation Precautions No active isolations  Medications Medications  aspirin EC tablet 81 mg (81 mg Oral Not Given 09/09/18 1016)  enalapril (VASOTEC) tablet 20 mg (20 mg Oral Given 09/09/18 1009)  hydrochlorothiazide (HYDRODIURIL) tablet 12.5 mg (12.5 mg Oral Given 09/09/18 1008)  carbidopa-levodopa (SINEMET IR) 25-100 MG per tablet immediate release 1 tablet (1 tablet Oral Given 09/09/18 1009)  cholecalciferol (VITAMIN D) tablet 1,000 Units (1,000 Units Oral Given 09/09/18 1009)  omega-3 acid ethyl esters (LOVAZA) capsule 1 g (1 g Oral Given 09/09/18 1009)  vitamin C (ASCORBIC ACID) tablet 500 mg (500 mg Oral Given 09/09/18 1016)  fluticasone (FLONASE) 50 MCG/ACT nasal spray 2 spray (2 sprays Each Nare Refused 09/09/18 1010)  docusate sodium (COLACE) capsule 100 mg (has no administration in time range)  heparin injection 5,000 Units (5,000 Units Subcutaneous Given 09/09/18 1347)  morphine 2 MG/ML injection 1 mg (1 mg Intravenous Given 09/09/18 0619)  ondansetron (ZOFRAN) injection 4 mg (has no administration in time range)  acetaminophen (TYLENOL) tablet 650 mg (has no administration in time range)  tamsulosin (FLOMAX) capsule 0.4 mg (0.4 mg Oral Given 09/09/18 1009)  0.9 %  sodium chloride infusion ( Intravenous Stopped 09/09/18 0303)  piperacillin-tazobactam (ZOSYN) IVPB 3.375 g (3.375 g Intravenous New Bag/Given 09/09/18 1351)  iohexol  (OMNIPAQUE) 240 MG/ML injection 50 mL (50 mLs Oral Contrast Given 09/08/18 1840)  iohexol (OMNIPAQUE) 300 MG/ML solution 100 mL (100 mLs Intravenous Contrast Given 09/08/18 1955)  sodium chloride 0.9 % bolus 1,000 mL (0 mLs Intravenous Stopped 09/08/18 2244)  piperacillin-tazobactam (ZOSYN) IVPB 3.375 g (0 g Intravenous Stopped 09/08/18 2244)  potassium chloride SA (K-DUR) CR tablet 40 mEq (40 mEq Oral Given 09/09/18 1347)    Mobility walks with device High fall risk   Focused Assessments Neuro Assessment Handoff:  Swallow screen pass? Yes  Cardiac Rhythm: Normal sinus rhythm NIH Stroke Scale ( +  Modified Stroke Scale Criteria)  Interval: Initial Level of Consciousness (1a.)   : Alert, keenly responsive LOC Questions (1b. )   +: Answers both questions correctly LOC Commands (1c. )   + : Performs both tasks correctly Best Gaze (2. )  +: Normal Visual (3. )  +: No visual loss Facial Palsy (4. )    : Normal symmetrical movements Motor Arm, Left (5a. )   +: No drift Motor Arm, Right (5b. )   +: No drift Motor Leg, Left (6a. )   +: No drift Motor Leg, Right (6b. )   +: No drift Limb Ataxia (7. ): Absent Sensory (8. )   +: Normal, no sensory loss Best Language (9. )   +: No aphasia Dysarthria (10. ): Normal Extinction/Inattention (11.)   +: No Abnormality Modified SS Total  +: 0 Complete NIHSS TOTAL: 0     Neuro Assessment: Within Defined Limits Neuro Checks:   Initial (09/08/18 1845)  Last Documented NIHSS Modified Score: 0 (09/08/18 1845) Has TPA been given? No If patient is a Neuro Trauma and patient is going to OR before floor call report to [facility] nurse: [phone] or [phone]     R Recommendations: See Admitting Provider Note  Report given to:   Additional Notes:

## 2018-09-09 NOTE — ED Notes (Signed)
Report off to lisa rn  

## 2018-09-09 NOTE — Progress Notes (Signed)
[facility] [place] NAME: Randy [name]    MR#:  [id number]  DATE OF BIRTH:  1934/06/03  SUBJECTIVE:   Patient here with her mental status and found to have acute cholecystitis  REVIEW OF SYSTEMS:    **Unable to obtain due to dementia   Tolerating Diet: npo      DRUG ALLERGIES:   Allergies  Allergen Reactions  . Antihistamines, Chlorpheniramine-Type Other (See Comments)    Can't urinate    VITALS:  Blood pressure 138/86, pulse 81, temperature 98.4 F (36.9 C), temperature source Oral, resp. rate (!) 22, height 6' (1.829 m), weight 90.7 kg, SpO2 90 %.  PHYSICAL EXAMINATION:  Constitutional: Appears well-developed and well-nourished. No distress. HENT: Normocephalic. [name] Oropharynx is clear and moist.  Eyes: Conjunctivae and EOM are normal. PERRLA, no scleral icterus.  Neck: Normal ROM. Neck supple. No JVD. No tracheal deviation. CVS: RRR, S1/S2 +, no murmurs, no gallops, no carotid bruit.  Pulmonary: Effort and breath sounds normal, no stridor, rhonchi, wheezes, rales.  Abdominal: Soft. BS +,  no distension, tenderness, rebound or guarding.  Musculoskeletal: Normal range of motion. No edema and no tenderness.  Neuro: Alert. CN 2-12 grossly intact. No focal deficits. Skin: Skin is warm and dry. No rash noted. Psychiatric: Pleasantly demented     LABORATORY PANEL:   CBC Recent Labs  Lab 09/09/18 0438  WBC 18.1*  HGB 13.4  HCT 39.4  PLT 193   ------------------------------------------------------------------------------------------------------------------  Chemistries  Recent Labs  Lab 09/09/18 0620 09/09/18 0627  NA 137  --   K 3.2*  --   CL 99  --   CO2 27  --   GLUCOSE 101*  --   BUN 22  --   CREATININE 1.01  --   CALCIUM 8.4*  --   AST  --  25  ALT  --  17  ALKPHOS  --  51  BILITOT  --  2.0*    ------------------------------------------------------------------------------------------------------------------  Cardiac Enzymes No results for input(s): TROPONINI in the last 168 hours. ------------------------------------------------------------------------------------------------------------------  RADIOLOGY:  Ct Head Wo Contrast  Result Date: 09/08/2018 CLINICAL DATA:  Altered mental status. EXAM: CT HEAD WITHOUT CONTRAST TECHNIQUE: Contiguous axial images were obtained from the base of the skull through the vertex without intravenous contrast. COMPARISON:  MRI dated 08/12/2018 FINDINGS: Brain: Diffuse cerebral cortical and cerebellar atrophy with minimal dilatation of the ventricles consistent with the atrophy. No hemorrhage or infarction or mass lesion. Minimal periventricular white matter lucency consistent with chronic small vessel ischemic disease. Vascular: No hyperdense vessel or unexpected calcification. Skull: Normal. Negative for fracture or focal lesion. Sinuses/Orbits: No acute abnormality. Chronic opacification of the portion of the left side of the frontal sinus. Other: None IMPRESSION: No acute intracranial abnormality. Atrophy with minimal chronic small vessel ischemic changes. Electronically Signed   By: Lorriane Shire M.D.   On: 09/08/2018 20:30   Mr Brain Wo Contrast  Result Date: 09/08/2018 CLINICAL DATA:  Initial evaluation for acute word-finding difficulty. EXAM: MRI HEAD WITHOUT CONTRAST TECHNIQUE: Multiplanar, multiecho pulse sequences of the brain and surrounding structures were obtained without intravenous contrast. COMPARISON:  Prior CT from earlier the same day as well as recent MRI from 08/12/2018. FINDINGS: Brain: Moderately advanced cerebral atrophy again noted. Scattered T2/FLAIR hyperintensity within the periventricular and deep white matter both cerebral hemispheres most consistent with chronic microvascular ischemic disease, mild to moderate in nature.  Superimposed small remote lacunar infarct noted at the left caudate  head. No abnormal foci of restricted diffusion to suggest acute or subacute ischemia. Gray-white matter differentiation maintained without evidence for remote cortical infarction. No foci of susceptibility artifact to suggest acute or chronic intracranial hemorrhage. No mass lesion, midline shift or mass effect no hydrocephalus. No extra-axial fluid collection. Pituitary gland suprasellar region normal. Midline structures intact. Vascular: Major intracranial vascular flow voids are maintained. Skull and upper cervical spine: Craniocervical junction within normal limits. Visualized upper cervical spine normal. Bone marrow signal intensity within normal limits. No scalp soft tissue abnormality. Sinuses/Orbits: Patient status post bilateral ocular lens replacement. Heterogeneous T2 signal intensity filling the left frontal sinus with associated expansion and thinning of the underlying inner table, suggesting mucocele formation. Paranasal sinuses are otherwise clear. No significant mastoid effusion. Inner ear structures normal. Other: None. IMPRESSION: 1. No acute intracranial abnormality. 2. Moderately advanced cerebral atrophy with chronic microvascular ischemic disease, stable. 3. Probable left frontal sinus mucocele, stable. This could be further evaluated with dedicated sinus CT as clinically warranted, as there could be some risk for intracranial extension. This could be performed on a nonemergent outpatient basis. Electronically Signed   By: Jeannine Boga M.D.   On: 09/08/2018 23:28   Ct Abdomen Pelvis W Contrast  Result Date: 09/08/2018 CLINICAL DATA:  Pain. EXAM: CT ABDOMEN AND PELVIS WITH CONTRAST TECHNIQUE: Multidetector CT imaging of the abdomen and pelvis was performed using the standard protocol following bolus administration of intravenous contrast. CONTRAST:  198mL OMNIPAQUE IOHEXOL 300 MG/ML  SOLN COMPARISON:  None. FINDINGS:  Lower chest: Moderate hiatal hernia. Aortic atherosclerosis. Borderline cardiomegaly. Hepatobiliary: The liver appears normal. The gallbladder is distended with numerous stones and sludge in the gallbladder. Edema of the gallbladder wall. No dilated bile ducts. Slight pericholecystic soft tissue stranding. Pancreas: Unremarkable. No pancreatic ductal dilatation or surrounding inflammatory changes. Spleen: Normal in size without focal abnormality. Adrenals/Urinary Tract: Numerous bilateral renal cysts of various densities. The largest cyst is in the mid left kidney measuring 11.7 cm in diameter. There is a lobulated 4.6 cm dense cyst on the lower pole the left kidney which does not enhance on delayed imaging. There are similar dense cysts in the right kidney. No hydronephrosis. There is marked distention of the bladder with enlargement of the median lobe of the prostate gland. There are least 2 stones in the dependent portion of the bladder. There are diverticula in the dome of the bladder. Stomach/Bowel: Moderate hiatal hernia. No dilated large or small bowel. The appendix is not discretely identified. Vascular/Lymphatic: Aortic atherosclerosis. No enlarged abdominal or pelvic lymph nodes. Reproductive: Enlarged median lobe of the prostate gland. Other: No abdominal wall hernia or abnormality. No abdominopelvic ascites. Musculoskeletal: No acute abnormality. Severe arthritis of the left hip. Diffuse degenerative disc disease in the lumbar spine. Fusion of the left SI joint. IMPRESSION: 1. Distended gallbladder with numerous stones and sludge in the gallbladder. Edema of the gallbladder wall with slight pericholecystic soft tissue stranding. The findings are consistent with acute cholecystitis. 2. Marked chronic or recurrent distention of the bladder with diverticula in the dome of the bladder, chronic. 3. Multiple bilateral renal cysts of various densities. 4. Moderate hiatal hernia. Aortic Atherosclerosis  (ICD10-I70.0). Electronically Signed   By: Lorriane Shire M.D.   On: 09/08/2018 20:27     ASSESSMENT AND PLAN:   84 year old male with Lewy body dementia who presented to the emergency room due to concern of altered mental status.  1.  Altered mental status due to underlying dementia and acute illness.  MRI negative for acute stroke.  2.  Acute cholecystitis: Plan for cholecystectomy.  Patient is at moderate risk for moderate risk procedure.  Patient may undergo surgery without further cardiac/neurological work-up. Continue Zosyn Increased WNC noted from acute choley.  3.  Parkinson's with Lewy body dementia: Continue Sinemet  4.  Hypertension: Continue HCTZ and enalapril  5.  Bladder distention: Started on Flomax  6.  Hypokalemia: Replete  Management plans discussed with the patient;s daughter and she is in agreement.  CODE STATUS: FULL  TOTAL TIME TAKING CARE OF THIS PATIENT: 30 minutes.     POSSIBLE D/C 3 days, DEPENDING ON CLINICAL CONDITION.   Bettey Costa M.D on 09/09/2018 at 11:50 AM  Between 7am to 6pm - Pager - [phone] After 6pm go to [email or web address] - password [email or web address] [facility]  Office  [phone]  CC: Primary care physician; [name], DO  Note: This dictation was prepared with Dragon dictation along with smaller phrase technology. Any transcriptional errors that result from this process are unintentional.

## 2018-09-09 NOTE — Progress Notes (Signed)
PHARMACIST - PHYSICIAN ORDER COMMUNICATION  CONCERNING: P&T Medication Policy on Herbal Medications  DESCRIPTION:  This patient's order for:  Co-Q10 capsules  has been noted.  This product(s) is classified as an "herbal" or natural product. Due to a lack of definitive safety studies or FDA approval, nonstandard manufacturing practices, plus the potential risk of unknown drug-drug interactions while on inpatient medications, the Pharmacy and Therapeutics Committee does not permit the use of "herbal" or natural products of this type within [facility].   ACTION TAKEN: The pharmacy department is unable to verify this order at this time and your patient has been informed of this safety policy. Please reevaluate patient's clinical condition at discharge and address if the herbal or natural product(s) should be resumed at that time.

## 2018-09-09 NOTE — Consult Note (Signed)
Pharmacy Antibiotic Note  Randy [name] is a 84 y.o. male admitted on 09/08/2018 with intra-abdominal infection.  Pharmacy has been consulted for Zosyn dosing.  Plan: Zosyn 3.375g IV q8h (4 hour infusion).  Height: 6' (182.9 cm) Weight: 200 lb (90.7 kg) IBW/kg (Calculated) : 77.6  Temp (24hrs), Avg:98.4 F (36.9 C), Min:98.4 F (36.9 C), Max:98.4 F (36.9 C)  Recent Labs  Lab 09/08/18 1833 09/08/18 1835 09/09/18 0438 09/09/18 0620  WBC 13.0*  --  18.1*  --   CREATININE 0.99  --   --  1.01  LATICACIDVEN  --  0.9  --   --     Estimated Creatinine Clearance: 61.9 mL/min (by C-G formula based on SCr of 1.01 mg/dL).    Allergies  Allergen Reactions  . Antihistamines, Chlorpheniramine-Type Other (See Comments)    Can't urinate    Antimicrobials this admission: Zosyn 9/1 >>    Microbiology results: 9/1 BCx: NGTD   Thank you for allowing pharmacy to be a part of this patient's care.  Randy [name] 09/09/2018 9:53 AM

## 2018-09-09 NOTE — ED Notes (Signed)
Hospitalist to bedside.

## 2018-09-09 NOTE — ED Notes (Addendum)
Pt transferred to room 32 awaiting admission bed; pt & daughter voices good understanding of plan of care; report given to [place], [place]

## 2018-09-10 ENCOUNTER — Encounter: Payer: Self-pay | Admitting: Anesthesiology

## 2018-09-10 ENCOUNTER — Inpatient Hospital Stay: Payer: Medicare Other | Admitting: Anesthesiology

## 2018-09-10 ENCOUNTER — Ambulatory Visit: Payer: Medicare Other | Admitting: Licensed Clinical Social Worker

## 2018-09-10 ENCOUNTER — Inpatient Hospital Stay: Payer: Medicare Other

## 2018-09-10 ENCOUNTER — Encounter
Admission: EM | Disposition: A | Discharge status: Home Health | Payer: Self-pay | Source: Home / Self Care | Attending: Internal Medicine

## 2018-09-10 HISTORY — PX: CHOLECYSTECTOMY: SHX55

## 2018-09-10 LAB — MAGNESIUM: Magnesium: 1.8 mg/dL (ref 1.7–2.4)

## 2018-09-10 LAB — CBC
HCT: 36.1 % — ABNORMAL LOW (ref 39.0–52.0)
Hemoglobin: 12 g/dL — ABNORMAL LOW (ref 13.0–17.0)
MCH: 28.7 pg (ref 26.0–34.0)
MCHC: 33.2 g/dL (ref 30.0–36.0)
MCV: 86.4 fL (ref 80.0–100.0)
Platelets: 161 10*3/uL (ref 150–400)
RBC: 4.18 MIL/uL — ABNORMAL LOW (ref 4.22–5.81)
RDW: 14.4 % (ref 11.5–15.5)
WBC: 15.3 10*3/uL — ABNORMAL HIGH (ref 4.0–10.5)
nRBC: 0 % (ref 0.0–0.2)

## 2018-09-10 LAB — HEPATIC FUNCTION PANEL
ALT: 40 U/L (ref 0–44)
AST: 459 U/L — ABNORMAL HIGH (ref 15–41)
Albumin: 2.7 g/dL — ABNORMAL LOW (ref 3.5–5.0)
Alkaline Phosphatase: 146 U/L — ABNORMAL HIGH (ref 38–126)
Bilirubin, Direct: 2 mg/dL — ABNORMAL HIGH (ref 0.0–0.2)
Indirect Bilirubin: 1.2 mg/dL — ABNORMAL HIGH (ref 0.3–0.9)
Total Bilirubin: 3.2 mg/dL — ABNORMAL HIGH (ref 0.3–1.2)
Total Protein: 5.9 g/dL — ABNORMAL LOW (ref 6.5–8.1)

## 2018-09-10 LAB — BASIC METABOLIC PANEL
Anion gap: 9 (ref 5–15)
BUN: 25 mg/dL — ABNORMAL HIGH (ref 8–23)
CO2: 24 mmol/L (ref 22–32)
Calcium: 8 mg/dL — ABNORMAL LOW (ref 8.9–10.3)
Chloride: 102 mmol/L (ref 98–111)
Creatinine, Ser: 0.97 mg/dL (ref 0.61–1.24)
GFR calc Af Amer: >60 mL/min (ref >60)
GFR calc non Af Amer: >60 mL/min (ref >60)
Glucose, Bld: 115 mg/dL — ABNORMAL HIGH (ref 70–99)
Potassium: 3.3 mmol/L — ABNORMAL LOW (ref 3.5–5.1)
Sodium: 135 mmol/L (ref 135–145)

## 2018-09-10 LAB — PHOSPHORUS: Phosphorus: 2.4 mg/dL — ABNORMAL LOW (ref 2.5–4.6)

## 2018-09-10 SURGERY — LAPAROSCOPIC CHOLECYSTECTOMY
Anesthesia: General

## 2018-09-10 MED ORDER — ENOXAPARIN SODIUM 40 MG/0.4ML ~~LOC~~ SOLN
40.0000 mg | SUBCUTANEOUS | Status: DC
  Administered 2018-09-11: 40 mg via SUBCUTANEOUS
  Filled 2018-09-10: qty 0.4

## 2018-09-10 MED ORDER — POTASSIUM PHOSPHATE MONOBASIC 500 MG PO TABS
1000.0000 mg | ORAL_TABLET | Freq: Once | ORAL | Status: DC
  Filled 2018-09-10: qty 2

## 2018-09-10 MED ORDER — FENTANYL CITRATE (PF) 100 MCG/2ML IJ SOLN
INTRAMUSCULAR | Status: AC
  Filled 2018-09-10: qty 2

## 2018-09-10 MED ORDER — FENTANYL CITRATE (PF) 100 MCG/2ML IJ SOLN: 25.0000 ug | INTRAMUSCULAR | Status: DC | PRN

## 2018-09-10 MED ORDER — PROPOFOL 10 MG/ML IV BOLUS
INTRAVENOUS | Status: AC
  Filled 2018-09-10: qty 20

## 2018-09-10 MED ORDER — ONDANSETRON HCL 4 MG/2ML IJ SOLN: 4.0000 mg | Freq: Once | INTRAMUSCULAR | Status: DC | PRN

## 2018-09-10 MED ORDER — ACETAMINOPHEN 10 MG/ML IV SOLN
INTRAVENOUS | Status: AC
  Filled 2018-09-10: qty 100

## 2018-09-10 MED ORDER — GADOBUTROL 1 MMOL/ML IV SOLN
9.0000 mL | Freq: Once | INTRAVENOUS | Status: AC | PRN
  Administered 2018-09-10: 9 mL via INTRAVENOUS

## 2018-09-10 MED ORDER — LIDOCAINE-EPINEPHRINE (PF) 1 %-1:200000 IJ SOLN
INTRAMUSCULAR | Status: DC | PRN
  Administered 2018-09-10: 25 mL

## 2018-09-10 MED ORDER — HEMOSTATIC AGENTS (NO CHARGE) OPTIME
TOPICAL | Status: DC | PRN
  Administered 2018-09-10: 1 via TOPICAL

## 2018-09-10 MED ORDER — PHENYLEPHRINE HCL (PRESSORS) 10 MG/ML IV SOLN
INTRAVENOUS | Status: DC | PRN
  Administered 2018-09-10 (×2): 100 ug via INTRAVENOUS

## 2018-09-10 MED ORDER — HYDROCODONE-ACETAMINOPHEN 5-325 MG PO TABS
1.0000 | ORAL_TABLET | Freq: Four times a day (QID) | ORAL | Status: DC | PRN
  Administered 2018-09-11 (×2): 1 via ORAL
  Filled 2018-09-10 (×2): qty 1

## 2018-09-10 MED ORDER — SUCCINYLCHOLINE CHLORIDE 20 MG/ML IJ SOLN
INTRAMUSCULAR | Status: DC | PRN
  Administered 2018-09-10: 120 mg via INTRAVENOUS

## 2018-09-10 MED ORDER — ACETAMINOPHEN 10 MG/ML IV SOLN
INTRAVENOUS | Status: DC | PRN
  Administered 2018-09-10: 1000 mg via INTRAVENOUS

## 2018-09-10 MED ORDER — SUGAMMADEX SODIUM 200 MG/2ML IV SOLN
INTRAVENOUS | Status: AC
  Filled 2018-09-10: qty 2

## 2018-09-10 MED ORDER — ONDANSETRON HCL 4 MG/2ML IJ SOLN
INTRAMUSCULAR | Status: AC
  Filled 2018-09-10: qty 2

## 2018-09-10 MED ORDER — LIDOCAINE HCL (CARDIAC) PF 100 MG/5ML IV SOSY
PREFILLED_SYRINGE | INTRAVENOUS | Status: DC | PRN
  Administered 2018-09-10: 100 mg via INTRAVENOUS

## 2018-09-10 MED ORDER — FENTANYL CITRATE (PF) 100 MCG/2ML IJ SOLN
INTRAMUSCULAR | Status: DC | PRN
  Administered 2018-09-10 (×2): 100 ug via INTRAVENOUS

## 2018-09-10 MED ORDER — ROCURONIUM BROMIDE 100 MG/10ML IV SOLN
INTRAVENOUS | Status: DC | PRN
  Administered 2018-09-10: 5 mg via INTRAVENOUS
  Administered 2018-09-10: 35 mg via INTRAVENOUS
  Administered 2018-09-10: 10 mg via INTRAVENOUS
  Administered 2018-09-10: 20 mg via INTRAVENOUS

## 2018-09-10 MED ORDER — ONDANSETRON HCL 4 MG/2ML IJ SOLN
INTRAMUSCULAR | Status: DC | PRN
  Administered 2018-09-10: 4 mg via INTRAVENOUS

## 2018-09-10 MED ORDER — PROPOFOL 10 MG/ML IV BOLUS
INTRAVENOUS | Status: DC | PRN
  Administered 2018-09-10: 100 mg via INTRAVENOUS
  Administered 2018-09-10: 50 mg via INTRAVENOUS

## 2018-09-10 MED ORDER — POTASSIUM CHLORIDE CRYS ER 20 MEQ PO TBCR
40.0000 meq | EXTENDED_RELEASE_TABLET | ORAL | Status: AC
  Administered 2018-09-10 (×2): 40 meq via ORAL
  Filled 2018-09-10 (×3): qty 2

## 2018-09-10 MED ORDER — SUGAMMADEX SODIUM 200 MG/2ML IV SOLN
INTRAVENOUS | Status: DC | PRN
  Administered 2018-09-10: 200 mg via INTRAVENOUS

## 2018-09-10 SURGICAL SUPPLY — 60 items
ANCHOR TIS RET SYS 235ML (MISCELLANEOUS) ×2 IMPLANT
APPLICATOR ARISTA FLEXITIP XL (MISCELLANEOUS) ×2 IMPLANT
APPLIER CLIP 5 13 M/L LIGAMAX5 (MISCELLANEOUS) ×2
BLADE SURG SZ11 CARB STEEL (BLADE) ×2 IMPLANT
CANISTER SUCT 1200ML W/VALVE (MISCELLANEOUS) ×2 IMPLANT
CHLORAPREP W/TINT 26 (MISCELLANEOUS) ×2 IMPLANT
CHOLANGIOGRAM CATH TAUT (CATHETERS) IMPLANT
CLIP APPLIE 5 13 M/L LIGAMAX5 (MISCELLANEOUS) ×1 IMPLANT
COVER WAND RF STERILE (DRAPES) ×2 IMPLANT
DECANTER SPIKE VIAL GLASS SM (MISCELLANEOUS) ×4 IMPLANT
DEFOGGER SCOPE WARMER CLEARIFY (MISCELLANEOUS) ×2 IMPLANT
DERMABOND ADVANCED (GAUZE/BANDAGES/DRESSINGS) ×1
DERMABOND ADVANCED .7 DNX12 (GAUZE/BANDAGES/DRESSINGS) ×1 IMPLANT
DISSECTOR BLUNT TIP ENDO 5MM (MISCELLANEOUS) IMPLANT
DISSECTOR KITTNER STICK (MISCELLANEOUS) IMPLANT
DISSECTORS/KITTNER STICK (MISCELLANEOUS)
DRAIN CHANNEL JP 15F RND 16 (MISCELLANEOUS) ×2 IMPLANT
DRAPE 3/4 80X56 (DRAPES) IMPLANT
DRAPE C-ARM XRAY 36X54 (DRAPES) IMPLANT
ELECT CAUTERY BLADE 6.4 (BLADE) ×2 IMPLANT
ELECT REM PT RETURN 9FT ADLT (ELECTROSURGICAL) ×2
ELECTRODE REM PT RTRN 9FT ADLT (ELECTROSURGICAL) ×1 IMPLANT
GLOVE BIOGEL PI IND STRL 7.0 (GLOVE) ×1 IMPLANT
GLOVE BIOGEL PI INDICATOR 7.0 (GLOVE) ×1
GLOVE SURG SYN 6.5 ES PF (GLOVE) ×2 IMPLANT
GOWN STRL REUS W/ TWL LRG LVL3 (GOWN DISPOSABLE) ×3 IMPLANT
GOWN STRL REUS W/TWL LRG LVL3 (GOWN DISPOSABLE) ×3
GRASPER SUT TROCAR 14GX15 (MISCELLANEOUS) ×2 IMPLANT
HEMOSTAT ARISTA ABSORB 3G PWDR (HEMOSTASIS) ×2 IMPLANT
IRRIGATION STRYKERFLOW (MISCELLANEOUS) IMPLANT
IRRIGATOR STRYKERFLOW (MISCELLANEOUS)
IV CATH ANGIO 12GX3 LT BLUE (NEEDLE) IMPLANT
IV NS 1000ML (IV SOLUTION)
IV NS 1000ML BAXH (IV SOLUTION) IMPLANT
JACKSON PRATT 10 (INSTRUMENTS) IMPLANT
L-HOOK LAP DISP 36CM (ELECTROSURGICAL) ×2
LABEL OR SOLS (LABEL) ×2 IMPLANT
LHOOK LAP DISP 36CM (ELECTROSURGICAL) ×1 IMPLANT
LIGASURE LAP MARYLAND 5MM 37CM (ELECTROSURGICAL) ×2 IMPLANT
NEEDLE HYPO 22GX1.5 SAFETY (NEEDLE) ×2 IMPLANT
NEEDLE INSUFFLATION 14GA 120MM (NEEDLE) IMPLANT
PACK LAP CHOLECYSTECTOMY (MISCELLANEOUS) ×2 IMPLANT
PENCIL ELECTRO HAND CTR (MISCELLANEOUS) ×2 IMPLANT
PORT ACCESS TROCAR AIRSEAL 5 (TROCAR) ×2 IMPLANT
SCISSORS METZENBAUM CVD 33 (INSTRUMENTS) ×2 IMPLANT
SET TRI-LUMEN FLTR TB AIRSEAL (TUBING) ×2 IMPLANT
SLEEVE ENDOPATH XCEL 5M (ENDOMECHANICALS) ×2 IMPLANT
SPONGE LAP 18X18 RF (DISPOSABLE) IMPLANT
STOPCOCK 4 WAY LG BORE MALE ST (IV SETS) IMPLANT
SUT ETHILON 3 0 PS 1 (SUTURE) ×2 IMPLANT
SUT MNCRL 4-0 (SUTURE) ×1
SUT MNCRL 4-0 27XMFL (SUTURE) ×1
SUT VIC AB 3-0 SH 27 (SUTURE)
SUT VIC AB 3-0 SH 27X BRD (SUTURE) IMPLANT
SUT VICRYL 0 AB UR-6 (SUTURE) ×4 IMPLANT
SUTURE MNCRL 4-0 27XMF (SUTURE) ×1 IMPLANT
SYR 20ML LL LF (SYRINGE) ×2 IMPLANT
TROCAR XCEL BLUNT TIP 100MML (ENDOMECHANICALS) ×2 IMPLANT
TROCAR XCEL NON-BLD 5MMX100MML (ENDOMECHANICALS) ×2 IMPLANT
WATER STERILE IRR 1000ML POUR (IV SOLUTION) ×2 IMPLANT

## 2018-09-10 NOTE — Anesthesia Preprocedure Evaluation (Signed)
Anesthesia Evaluation  Patient identified by MRN, date of birth, ID band Patient awake    Reviewed: Allergy & Precautions, NPO status , Patient's Chart, lab work & pertinent test results  Airway Mallampati: II  TM Distance: >3 FB     Dental   Pulmonary former smoker,    Pulmonary exam normal        Cardiovascular hypertension, Normal cardiovascular exam     Neuro/Psych negative neurological ROS  negative psych ROS   GI/Hepatic Neg liver ROS, GERD  ,  Endo/Other  negative endocrine ROS  Renal/GU negative Renal ROS  negative genitourinary   Musculoskeletal  (+) Arthritis , Osteoarthritis,    Abdominal Normal abdominal exam  (+)   Peds negative pediatric ROS (+)  Hematology negative hematology ROS (+)   Anesthesia Other Findings Past Medical History: No date: GERD (gastroesophageal reflux disease) No date: Hyperlipidemia No date: Hypertension No date: Osteoporosis  Reproductive/Obstetrics                             Anesthesia Physical Anesthesia Plan  ASA: II  Anesthesia Plan: General   Post-op Pain Management:    Induction:   PONV Risk Score and Plan:   Airway Management Planned: Oral ETT  Additional Equipment:   Intra-op Plan:   Post-operative Plan:   Informed Consent: I have reviewed the patients History and Physical, chart, labs and discussed the procedure including the risks, benefits and alternatives for the proposed anesthesia with the patient or authorized representative who has indicated his/her understanding and acceptance.     Dental advisory given  Plan Discussed with: CRNA and Surgeon  Anesthesia Plan Comments:         Anesthesia Quick Evaluation

## 2018-09-10 NOTE — Progress Notes (Addendum)
[facility] [place] NAME: Randy [name]    MR#:  [id number]  DATE OF BIRTH:  March 09, 1934  SUBJECTIVE:   Family at bedside.  Awaiting MRCP to be completed due to elevated LFTs.  Patient with dementia.  Not much history provided  REVIEW OF SYSTEMS:    **Unable to obtain due to dementia   Tolerating Diet: npo      DRUG ALLERGIES:   Allergies  Allergen Reactions  . Antihistamines, Chlorpheniramine-Type Other (See Comments)    Can't urinate    VITALS:  Blood pressure (!) 147/83, pulse 95, temperature 97.9 F (36.6 C), temperature source Oral, resp. rate (!) 26, height 6' (1.829 m), weight 90.7 kg, SpO2 96 %.  PHYSICAL EXAMINATION:  Constitutional: Appears well-developed and well-nourished. No distress. HENT: Normocephalic. [name] Oropharynx is clear and moist.  Eyes: Conjunctivae and EOM are normal. PERRLA, no scleral icterus.  Neck: Normal ROM. Neck supple. No JVD. No tracheal deviation. CVS: RRR, S1/S2 +, no murmurs, no gallops, no carotid bruit.  Pulmonary: Effort and breath sounds normal, no stridor, rhonchi, wheezes, rales.  Abdominal: Soft. BS +,  no distension, tenderness, rebound or guarding.  Musculoskeletal: Normal range of motion. No edema and no tenderness.  Neuro: Alert. CN 2-12 grossly intact. No focal deficits. Skin: Skin is warm and dry. No rash noted. Psychiatric: Pleasantly demented     LABORATORY PANEL:   CBC Recent Labs  Lab 09/10/18 0341  WBC 15.3*  HGB 12.0*  HCT 36.1*  PLT 161   ------------------------------------------------------------------------------------------------------------------  Chemistries  Recent Labs  Lab 09/10/18 0341  NA 135  K 3.3*  CL 102  CO2 24  GLUCOSE 115*  BUN 25*  CREATININE 0.97  CALCIUM 8.0*  MG 1.8  AST 459*  ALT 40  ALKPHOS 146*  BILITOT 3.2*    ------------------------------------------------------------------------------------------------------------------  Cardiac Enzymes No results for input(s): TROPONINI in the last 168 hours. ------------------------------------------------------------------------------------------------------------------  RADIOLOGY:  Ct Head Wo Contrast  Result Date: 09/08/2018 CLINICAL DATA:  Altered mental status. EXAM: CT HEAD WITHOUT CONTRAST TECHNIQUE: Contiguous axial images were obtained from the base of the skull through the vertex without intravenous contrast. COMPARISON:  MRI dated 08/12/2018 FINDINGS: Brain: Diffuse cerebral cortical and cerebellar atrophy with minimal dilatation of the ventricles consistent with the atrophy. No hemorrhage or infarction or mass lesion. Minimal periventricular white matter lucency consistent with chronic small vessel ischemic disease. Vascular: No hyperdense vessel or unexpected calcification. Skull: Normal. Negative for fracture or focal lesion. Sinuses/Orbits: No acute abnormality. Chronic opacification of the portion of the left side of the frontal sinus. Other: None IMPRESSION: No acute intracranial abnormality. Atrophy with minimal chronic small vessel ischemic changes. Electronically Signed   By: Lorriane Shire M.D.   On: 09/08/2018 20:30   Mr Brain Wo Contrast  Result Date: 09/08/2018 CLINICAL DATA:  Initial evaluation for acute word-finding difficulty. EXAM: MRI HEAD WITHOUT CONTRAST TECHNIQUE: Multiplanar, multiecho pulse sequences of the brain and surrounding structures were obtained without intravenous contrast. COMPARISON:  Prior CT from earlier the same day as well as recent MRI from 08/12/2018. FINDINGS: Brain: Moderately advanced cerebral atrophy again noted. Scattered T2/FLAIR hyperintensity within the periventricular and deep white matter both cerebral hemispheres most consistent with chronic microvascular ischemic disease, mild to moderate in nature.  Superimposed small remote lacunar infarct noted at the left caudate head. No abnormal foci of restricted diffusion to suggest acute or subacute ischemia. Gray-white matter differentiation maintained without evidence for remote cortical infarction. No  foci of susceptibility artifact to suggest acute or chronic intracranial hemorrhage. No mass lesion, midline shift or mass effect no hydrocephalus. No extra-axial fluid collection. Pituitary gland suprasellar region normal. Midline structures intact. Vascular: Major intracranial vascular flow voids are maintained. Skull and upper cervical spine: Craniocervical junction within normal limits. Visualized upper cervical spine normal. Bone marrow signal intensity within normal limits. No scalp soft tissue abnormality. Sinuses/Orbits: Patient status post bilateral ocular lens replacement. Heterogeneous T2 signal intensity filling the left frontal sinus with associated expansion and thinning of the underlying inner table, suggesting mucocele formation. Paranasal sinuses are otherwise clear. No significant mastoid effusion. Inner ear structures normal. Other: None. IMPRESSION: 1. No acute intracranial abnormality. 2. Moderately advanced cerebral atrophy with chronic microvascular ischemic disease, stable. 3. Probable left frontal sinus mucocele, stable. This could be further evaluated with dedicated sinus CT as clinically warranted, as there could be some risk for intracranial extension. This could be performed on a nonemergent outpatient basis. Electronically Signed   By: Jeannine Boga M.D.   On: 09/08/2018 23:28   Ct Abdomen Pelvis W Contrast  Result Date: 09/08/2018 CLINICAL DATA:  Pain. EXAM: CT ABDOMEN AND PELVIS WITH CONTRAST TECHNIQUE: Multidetector CT imaging of the abdomen and pelvis was performed using the standard protocol following bolus administration of intravenous contrast. CONTRAST:  177mL OMNIPAQUE IOHEXOL 300 MG/ML  SOLN COMPARISON:  None. FINDINGS:  Lower chest: Moderate hiatal hernia. Aortic atherosclerosis. Borderline cardiomegaly. Hepatobiliary: The liver appears normal. The gallbladder is distended with numerous stones and sludge in the gallbladder. Edema of the gallbladder wall. No dilated bile ducts. Slight pericholecystic soft tissue stranding. Pancreas: Unremarkable. No pancreatic ductal dilatation or surrounding inflammatory changes. Spleen: Normal in size without focal abnormality. Adrenals/Urinary Tract: Numerous bilateral renal cysts of various densities. The largest cyst is in the mid left kidney measuring 11.7 cm in diameter. There is a lobulated 4.6 cm dense cyst on the lower pole the left kidney which does not enhance on delayed imaging. There are similar dense cysts in the right kidney. No hydronephrosis. There is marked distention of the bladder with enlargement of the median lobe of the prostate gland. There are least 2 stones in the dependent portion of the bladder. There are diverticula in the dome of the bladder. Stomach/Bowel: Moderate hiatal hernia. No dilated large or small bowel. The appendix is not discretely identified. Vascular/Lymphatic: Aortic atherosclerosis. No enlarged abdominal or pelvic lymph nodes. Reproductive: Enlarged median lobe of the prostate gland. Other: No abdominal wall hernia or abnormality. No abdominopelvic ascites. Musculoskeletal: No acute abnormality. Severe arthritis of the left hip. Diffuse degenerative disc disease in the lumbar spine. Fusion of the left SI joint. IMPRESSION: 1. Distended gallbladder with numerous stones and sludge in the gallbladder. Edema of the gallbladder wall with slight pericholecystic soft tissue stranding. The findings are consistent with acute cholecystitis. 2. Marked chronic or recurrent distention of the bladder with diverticula in the dome of the bladder, chronic. 3. Multiple bilateral renal cysts of various densities. 4. Moderate hiatal hernia. Aortic Atherosclerosis  (ICD10-I70.0). Electronically Signed   By: Lorriane Shire M.D.   On: 09/08/2018 20:27     ASSESSMENT AND PLAN:   84 year old male with Lewy body dementia who presented to the emergency room due to concern of altered mental status.  1.  Altered mental status due to underlying dementia and acute illness.  MRI negative for acute stroke.  2.  Acute cholecystitis with elevated LFTs. Plan for stat MRCP this morning.  If it is  positive he will need transfer to [facility] for ERCP since Dr. Verl Blalock is not available.  Cholecystectomy afterwards.  Continue Zosyn   3.  Parkinson's with Lewy body dementia/Vascular disease: Continue Sinemet Watch for post op delirium and will need PT consult   4.  Hypertension: Continue HCTZ and enalapril  5.  Bladder distention: Started on Flomax  6.  Hypokalemia: Replete  Management plans discussed with the patient's daughter and she is in agreement. Discussed with surgery. CODE STATUS: FULL  TOTAL TIME TAKING CARE OF THIS PATIENT: 28 minutes.     POSSIBLE D/C 3-5 days, DEPENDING ON CLINICAL CONDITION.   Bettey Costa M.D on 09/10/2018 at 10:43 AM  Between 7am to 6pm - Pager - [phone] After 6pm go to [email or web address] - password [email or web address] [facility]  Office  [phone]  CC: Primary care physician; [name], DO  Note: This dictation was prepared with Dragon dictation along with smaller phrase technology. Any transcriptional errors that result from this process are unintentional.

## 2018-09-10 NOTE — Transfer of Care (Signed)
Immediate Anesthesia Transfer of Care Note  Patient: Randy [name]  Procedure(s) Performed: LAPAROSCOPIC CHOLECYSTECTOMY (N/A )  Patient Location: PACU  Anesthesia Type:General  Level of Consciousness: sedated  Airway & Oxygen Therapy: Patient Spontanous Breathing and Patient connected to face mask oxygen  Post-op Assessment: Report given to RN and Post -op Vital signs reviewed and stable  Post vital signs: Reviewed  Last Vitals:  Vitals Value Taken Time  BP 129/74 09/10/18 1748  Temp    Pulse 77 09/10/18 1748  Resp 18 09/10/18 1748  SpO2 91 % 09/10/18 1748  Vitals shown include unvalidated device data.  Last Pain:  Vitals:   09/10/18 1430  TempSrc: Temporal  PainSc: 0-No pain         Complications: No apparent anesthesia complications

## 2018-09-10 NOTE — Anesthesia Post-op Follow-up Note (Signed)
Anesthesia QCDR form completed.        

## 2018-09-10 NOTE — Anesthesia Procedure Notes (Signed)
Procedure Name: Intubation Date/Time: 09/10/2018 3:11 PM Performed by: Hedda Slade, CRNA Pre-anesthesia Checklist: Patient identified, Patient being monitored, Timeout performed, Emergency Drugs available and Suction available Patient Re-evaluated:Patient Re-evaluated prior to induction Oxygen Delivery Method: Circle system utilized Preoxygenation: Pre-oxygenation with 100% oxygen Induction Type: IV induction Ventilation: Mask ventilation without difficulty Laryngoscope Size: Mac and 4 Grade View: Grade II Tube type: Oral Tube size: 7.5 mm Number of attempts: 1 Airway Equipment and Method: Stylet Placement Confirmation: ETT inserted through vocal cords under direct vision,  positive ETCO2 and breath sounds checked- equal and bilateral Secured at: 22 cm Tube secured with: Tape Dental Injury: Teeth and Oropharynx as per pre-operative assessment

## 2018-09-10 NOTE — Chronic Care Management (AMB) (Signed)
  Care Management   Follow Up Note   09/10/2018 Name: Randy [name] MRN: [id number] DOB: [birth year]  Referred by: [name], DO Reason for referral : Care Coordination   Randy [name] is a 84 y.o. year old male who is a primary care patient of [name], DO. The care management team was consulted for assistance with care management and care coordination needs.    Review of patient status, including review of consultants reports, relevant laboratory and other test results, and collaboration with appropriate care team members and the patient's provider was performed as part of comprehensive patient evaluation and provision of chronic care management services.    LCSW completed CCM outreach attempt today and spoke with patient's spouse. Spouse informed LCSW that patient is currently hospitalized. LCSW will update CCM team. LCSW rescheduled CCM SW appointment as well.  The care management team will reach out to the patient again over the next 30 days days.   Eula Fried, BSW, MSW, [email or web address] Phone: [phone]

## 2018-09-10 NOTE — Progress Notes (Signed)
Subjective:  CC: Randy [name] is a 84 y.o. male  Hospital stay day 2, acute cholecystitis  HPI: No acute issues overnight.  Feeling slightly better. Elevated LFTs  ROS:  General: Denies weight loss, weight gain, fatigue, fevers, chills, and night sweats. Heart: Denies chest pain, palpitations, racing heart, irregular heartbeat, leg pain or swelling, and decreased activity tolerance. Respiratory: Denies breathing difficulty, shortness of breath, wheezing, cough, and sputum. GI: Denies change in appetite, heartburn, nausea, vomiting, constipation, diarrhea, and blood in stool. GU: Denies difficulty urinating, pain with urinating, urgency, frequency, blood in urine.   Objective:   Temp:  [97.8 F (36.6 C)-102 F (38.9 C)] 97.9 F (36.6 C) (09/03 1129) Pulse Rate:  [88-95] 93 (09/03 1129) Resp:  [18-26] 22 (09/03 1129) BP: (121-147)/(70-83) 137/83 (09/03 1129) SpO2:  [90 %-97 %] 95 % (09/03 1129)     Height: 6' (182.9 cm) Weight: 90.7 kg BMI (Calculated): 27.12   Intake/Output this shift:   Intake/Output Summary (Last 24 hours) at 09/10/2018 1404 Last data filed at 09/10/2018 0900 Gross per 24 hour  Intake 1001.55 ml  Output 1375 ml  Net -373.45 ml    Constitutional :  alert, cooperative, appears stated age and no distress  Respiratory:  clear to auscultation bilaterally  Cardiovascular:  regular rate and rhythm  Gastrointestinal: soft, no guarding, TTP in RUQ.   Skin: Cool and moist.   Psychiatric: Normal affect, non-agitated, not confused       LABS:  CMP Latest Ref Rng & Units 09/10/2018 09/09/2018 09/08/2018  Glucose 70 - 99 mg/dL 115(H) 101(H) 117(H)  BUN 8 - 23 mg/dL 25(H) 22 26(H)  Creatinine 0.61 - 1.24 mg/dL 0.97 1.01 0.99  Sodium 135 - 145 mmol/L 135 137 138  Potassium 3.5 - 5.1 mmol/L 3.3(L) 3.2(L) 3.8  Chloride 98 - 111 mmol/L 102 99 97(L)  CO2 22 - 32 mmol/L 24 27 30   Calcium 8.9 - 10.3 mg/dL 8.0(L) 8.4(L) 9.0  Total Protein 6.5 - 8.1 g/dL 5.9(L) 6.4(L) 7.5   Total Bilirubin 0.3 - 1.2 mg/dL 3.2(H) 2.0(H) 1.3(H)  Alkaline Phos 38 - 126 U/L 146(H) 51 70  AST 15 - 41 U/L 459(H) 25 24  ALT 0 - 44 U/L 40 17 11   CBC Latest Ref Rng & Units 09/10/2018 09/09/2018 09/08/2018  WBC 4.0 - 10.5 K/uL 15.3(H) 18.1(H) 13.0(H)  Hemoglobin 13.0 - 17.0 g/dL 12.0(L) 13.4 14.1  Hematocrit 39.0 - 52.0 % 36.1(L) 39.4 42.0  Platelets 150 - 400 K/uL 161 193 209    RADS: PENDING OFFICIAL MRCP read.  Prelim is no CBD stone Assessment:   Acute cholecystitis, will proceed with lap chole since prelim MRCP negative for choledocholithiasis.  Discussed the risk of surgery including post-op infxn, seroma, biloma, chronic pain, poor-delayed wound healing, retained gallstone, conversion to open procedure, post-op SBO or ileus, and need for additional procedures to address said risks.  The risks of general anesthetic including MI, CVA, sudden death or even reaction to anesthetic medications also discussed. Alternatives include continued observation.  Benefits include possible symptom relief, prevention of complications including acute cholecystitis, pancreatitis.  Typical post operative recovery of 3-5 days rest, continued pain in area and incision sites, possible loose stools up to 4-6 weeks, also discussed.  The patient and family member at bedside understands the risks, any and all questions were answered to the patient's satisfaction.

## 2018-09-10 NOTE — Consult Note (Addendum)
PHARMACY CONSULT NOTE - FOLLOW UP  Pharmacy Consult for Electrolyte Monitoring and Replacement   Recent Labs: Potassium (mmol/L)  Date Value  09/10/2018 3.3 (L)  04/17/2015 4.0   Magnesium (mg/dL)  Date Value  09/10/2018 1.8   Calcium (mg/dL)  Date Value  09/10/2018 8.0 (L)  04/17/2015 8.5 (A)   Albumin (g/dL)  Date Value  09/10/2018 2.7 (L)   Albumin Serum (no units)  Date Value  04/17/2015 3.9   Phosphorus (mg/dL)  Date Value  09/10/2018 2.4 (L)   Sodium  Date Value  09/10/2018 135 mmol/L  04/17/2015 143   Corrected Ca: 9.04 mg/dL  Assessment: Pharmacy consulted to monitor and replace Electrolytes in 84yo patient with cholecystitis  Goal of Therapy:  Electrolytes WNL  Plan:   replace with KCl 4mEq PO x 2 doses.   Replace phosphorous with K-Phos 1000 mg, which contains 6.6 mEq potassium  recheck electrolytes tomorrow with AM labs.  Dallie Piles ,PharmD Clinical Pharmacist 09/10/2018 7:40 AM

## 2018-09-10 NOTE — Anesthesia Postprocedure Evaluation (Signed)
Anesthesia Post Note  Patient: Randy [name]  Procedure(s) Performed: LAPAROSCOPIC CHOLECYSTECTOMY (N/A )  Patient location during evaluation: PACU Anesthesia Type: General Level of consciousness: awake and alert Pain management: pain level controlled Vital Signs Assessment: post-procedure vital signs reviewed and stable Respiratory status: spontaneous breathing, nonlabored ventilation, respiratory function stable and patient connected to nasal cannula oxygen Cardiovascular status: blood pressure returned to baseline and stable Postop Assessment: no apparent nausea or vomiting Anesthetic complications: no     Last Vitals:  Vitals:   09/10/18 1900 09/10/18 1945  BP: 132/80 (!) 148/81  Pulse: 79 87  Resp: 16 20  Temp: 36.8 C 36.6 C  SpO2: 94% 96%    Last Pain:  Vitals:   09/10/18 1945  TempSrc: Oral  PainSc:                  Martha Clan

## 2018-09-11 LAB — CBC
HCT: 36 % — ABNORMAL LOW (ref 39.0–52.0)
Hemoglobin: 11.8 g/dL — ABNORMAL LOW (ref 13.0–17.0)
MCH: 28.9 pg (ref 26.0–34.0)
MCHC: 32.8 g/dL (ref 30.0–36.0)
MCV: 88.2 fL (ref 80.0–100.0)
Platelets: 188 10*3/uL (ref 150–400)
RBC: 4.08 MIL/uL — ABNORMAL LOW (ref 4.22–5.81)
RDW: 14.6 % (ref 11.5–15.5)
WBC: 9.4 10*3/uL (ref 4.0–10.5)
nRBC: 0 % (ref 0.0–0.2)

## 2018-09-11 LAB — PHOSPHORUS: Phosphorus: 2.2 mg/dL — ABNORMAL LOW (ref 2.5–4.6)

## 2018-09-11 MED ORDER — TAMSULOSIN HCL 0.4 MG PO CAPS: 0.4000 mg | ORAL_CAPSULE | Freq: Every day | ORAL | 0 refills | Status: DC

## 2018-09-11 MED ORDER — AMOXICILLIN-POT CLAVULANATE 875-125 MG PO TABS: 1.0000 | ORAL_TABLET | Freq: Two times a day (BID) | ORAL | 0 refills | Status: DC

## 2018-09-11 NOTE — Evaluation (Signed)
Physical Therapy Evaluation Patient Details Name: Randy [name] MRN: [id number] DOB: 1934/03/06 Today's Date: 09/11/2018   History of Present Illness  Pt admitted for acute cholecystitis and is s/p lap chole on 09/10/18. Pt with complaints of leg weakness with AMS along with pain. History includes GERD, dementia, and HTN.  Clinical Impression  Pt is a pleasant 84 year old male who was admitted for acute cholecystitis. Pt performs bed mobility/transfers with min assist and ambulation with RW and CGA. Pt confused at baseline, follows commands well. Pt demonstrates deficits with strength/mobility/balance. Recommend continued use of RW for safety. Would benefit from skilled PT to address above deficits and promote optimal return to PLOF. Recommend transition to [place] upon discharge from acute hospitalization.     Follow Up Recommendations Home health PT;Supervision for mobility/OOB    Equipment Recommendations  Rolling walker with 5" wheels;3in1 (PT)    Recommendations for Other Services       Precautions / Restrictions Precautions Precautions: Fall Restrictions Weight Bearing Restrictions: No      Mobility  Bed Mobility Overal bed mobility: Needs Assistance Bed Mobility: Supine to Sit     Supine to sit: Min assist     General bed mobility comments: pt able to initiate movement towards EOB. Does require assist for scooting out towards EOB. Once seated, demonstrates upright posture.  Transfers Overall transfer level: Needs assistance Equipment used: Rolling walker (2 wheeled) Transfers: Sit to/from Stand Sit to Stand: Min assist         General transfer comment: bed elevated prior to standing. Once standing, demonstrates post weight shift needing min assist for correction. Follows commands well.  Ambulation/Gait Ambulation/Gait assistance: Min guard Gait Distance (Feet): 200 Feet Assistive device: Rolling walker (2 wheeled) Gait Pattern/deviations: Step-through pattern      General Gait Details: ambulated using RW with cues and occasional min assist to keep RW closer to body. Needs cues for obstacle avoidance.  Stairs            Wheelchair Mobility    Modified Rankin (Stroke Patients Only)       Balance Overall balance assessment: Needs assistance Sitting-balance support: Feet supported Sitting balance-Leahy Scale: Good     Standing balance support: Bilateral upper extremity supported Standing balance-Leahy Scale: Fair Standing balance comment: post lean                             Pertinent Vitals/Pain Pain Assessment: No/denies pain    Home Living Family/patient expects to be discharged to:: Private residence Living Arrangements: Spouse/significant other;Children(daughter) Available Help at Discharge: Family Type of Home: House Home Access: Stairs to enter Entrance Stairs-Rails: Right Entrance Stairs-Number of Steps: 7 Home Layout: One level Home Equipment: Environmental consultant - 4 wheels;Cane - single point(lift chair)      Prior Function Level of Independence: Independent with assistive device(s)         Comments: was using SPC prior to admission with 1 fall in past 6 months     Hand Dominance        Extremity/Trunk Assessment   Upper Extremity Assessment Upper Extremity Assessment: Generalized weakness(B UE grossly 4/5)    Lower Extremity Assessment Lower Extremity Assessment: Generalized weakness(R LE grossly 4/5; L LE grossly 4+/5)       Communication   Communication: No difficulties  Cognition Arousal/Alertness: Awake/alert Behavior During Therapy: WFL for tasks assessed/performed Overall Cognitive Status: History of cognitive impairments - at baseline  General Comments: confused to situation/place. Able to follow commands       General Comments      Exercises Other Exercises Other Exercises: Supine ther-ex performed on B LE including AP, SLRs, hip  abd/add and standing alt. marching holding to RW for balance. 10 reps with cga for assistance   Assessment/Plan    PT Assessment Patient needs continued PT services  PT Problem List Decreased strength;Decreased balance;Decreased mobility;Decreased knowledge of use of DME;Decreased safety awareness       PT Treatment Interventions Gait training;DME instruction;Therapeutic exercise;Balance training    PT Goals (Current goals can be found in the Care Plan section)  Acute Rehab PT Goals Patient Stated Goal: to go home PT Goal Formulation: With patient/family Time For Goal Achievement: 09/25/18 Potential to Achieve Goals: Good    Frequency Min 2X/week   Barriers to discharge        Co-evaluation               AM-PAC PT "6 Clicks" Mobility  Outcome Measure Help needed turning from your back to your side while in a flat bed without using bedrails?: A Little Help needed moving from lying on your back to sitting on the side of a flat bed without using bedrails?: A Little Help needed moving to and from a bed to a chair (including a wheelchair)?: A Little Help needed standing up from a chair using your arms (e.g., wheelchair or bedside chair)?: A Little Help needed to walk in hospital room?: A Little Help needed climbing 3-5 steps with a railing? : A Lot 6 Click Score: 17    End of Session Equipment Utilized During Treatment: Gait belt Activity Tolerance: Patient tolerated treatment well Patient left: in chair;with chair alarm set;with family/visitor present Nurse Communication: Mobility status PT Visit Diagnosis: Unsteadiness on feet (R26.81);Muscle weakness (generalized) (M62.81);History of falling (Z91.81);Difficulty in walking, not elsewhere classified (R26.2)    Time: [id number] PT Time Calculation (min) (ACUTE ONLY): 40 min   Charges:   PT Evaluation $PT Eval Low Complexity: 1 Low PT Treatments $Gait Training: 8-22 mins $Therapeutic Exercise: 8-22 mins         Greggory Stallion, PT, DPT [phone]   Triva Hueber 09/11/2018, 11:37 AM

## 2018-09-11 NOTE — Progress Notes (Signed)
Tolerating clear liquid diet without difficulties. Patient's daughter is at bedside, and suggest patient may need help with meals when he gets home because she is having to feed him in order for him to eat. Note sent to case management to follow up. Will endorse.

## 2018-09-11 NOTE — Discharge Summary (Signed)
Randy [name]    MR#:  [id number]  DATE OF BIRTH:  10/15/1934  DATE OF ADMISSION:  09/08/2018 ADMITTING PHYSICIAN: Vaughan Basta, MD  DATE OF DISCHARGE: 09/11/2018  PRIMARY CARE PHYSICIAN: [name], DO    ADMISSION DIAGNOSIS:  Urinary retention [R33.9] Cholecystitis [K81.9] Generalized abdominal pain [R10.84] Altered mental status, unspecified altered mental status type [R41.82]  DISCHARGE DIAGNOSIS:  Principal Problem:   Acute cholecystitis   SECONDARY DIAGNOSIS:   Past Medical History:  Diagnosis Date  . GERD (gastroesophageal reflux disease)   . Hyperlipidemia   . Hypertension   . Osteoporosis     HOSPITAL COURSE:   84 year old male with Lewy body dementia who presented to the emergency room due to concern of altered mental status.  1.  Altered mental status due to underlying dementia and acute illness.  MRI negative for acute stroke. Patient is at his baseline.   2.  Acute cholecystitis with elevated LFTs. MRCP was negative for choledocholithiasis  And therefore he was taken to the operating room for laparoscopic cholecystectomy. He is postoperative day #1 status post laparoscopic cholecystectomy for acute cholecystitis.  He has a JP drain placed due to difficult gallbladder. Patient tolerating diet. As per surgery okay to discharge with follow-up in 1 week for wound check and possible drain removal.  Patient will continue on Augmentin for gangrenous gallbladder.   3.  Parkinson's with Lewy body dementia/Vascular disease: Continue Sinemet  4.  Hypertension: Continue HCTZ and enalapril  5.  Bladder distention: Started on Flomax  6.  Hypokalemia: Repleted PRN   DISCHARGE CONDITIONS AND DIET:   Stable Regular diet  CONSULTS OBTAINED:  Treatment Team:  Benjamine Sprague, DO  DRUG ALLERGIES:   Allergies  Allergen Reactions  . Antihistamines, Chlorpheniramine-Type  Other (See Comments)    Can't urinate    DISCHARGE MEDICATIONS:   Allergies as of 09/11/2018      Reactions   Antihistamines, Chlorpheniramine-type Other (See Comments)   Can't urinate      Medication List    TAKE these medications   amoxicillin-clavulanate 875-125 MG tablet Commonly known as: Augmentin Take 1 tablet by mouth 2 (two) times daily.   aspirin EC 81 MG tablet Take 81 mg by mouth daily.   carbidopa-levodopa 25-100 MG tablet Commonly known as: SINEMET IR Take 1 tablet by mouth 3 (three) times daily.   cholecalciferol 1000 units tablet Commonly known as: VITAMIN D Take 1,000 Units by mouth daily.   Co Q 10 100 MG Caps Take 1 capsule by mouth daily.   enalapril 20 MG tablet Commonly known as: VASOTEC TAKE 1 TABLET BY MOUTH TWICE A DAY   Fish Oil 1200 MG Caps Take by mouth.   fluticasone 50 MCG/ACT nasal spray Commonly known as: FLONASE Place 2 sprays into both nostrils daily.   furosemide 20 MG tablet Commonly known as: LASIX Take 1 tablet (20 mg total) by mouth daily as needed for edema. For 1 week take 2 pills in morning then resume normal dose.   hydrochlorothiazide 12.5 MG tablet Commonly known as: HYDRODIURIL Take 1 tablet (12.5 mg total) by mouth daily.   meloxicam 15 MG tablet Commonly known as: MOBIC TAKE 1 TABLET (15 MG TOTAL) BY MOUTH DAILY AS NEEDED (MODERATE BACK PAIN).   tamsulosin 0.4 MG Caps capsule Commonly known as: FLOMAX Take 1 capsule (0.4 mg total) by mouth daily. Start taking on: September 12, 2018   vitamin C  500 MG tablet Commonly known as: ASCORBIC ACID Take 500 mg by mouth daily.            Durable Medical Equipment  (From admission, onward)         Start     Ordered   09/11/18 1005  For home use only DME Gilford Rile  [facility])  Once    Question:  Patient needs a walker to treat with the following condition  Answer:  Weakness   09/11/18 1005            Today   CHIEF COMPLAINT:  Daughter at bedside Did  well during surgery   VITAL SIGNS:  Blood pressure (!) 153/75, pulse 70, temperature 97.6 F (36.4 C), temperature source Oral, resp. rate 20, height 6' (1.829 m), weight 90.7 kg, SpO2 91 %.   REVIEW OF SYSTEMS:  Review of Systems  Unable to perform ROS: Dementia     PHYSICAL EXAMINATION:  GENERAL:  84 y.o.-year-old patient lying in the bed with no acute distress.  NECK:  Supple, no jugular venous distention. No thyroid enlargement, no tenderness.  LUNGS: Normal breath sounds bilaterally, no wheezing, rales,rhonchi  No use of accessory muscles of respiration.  CARDIOVASCULAR: S1, S2 normal. No murmurs, rubs, or gallops.  ABDOMEN: Soft, non-tender, non-distended. Bowel sounds present. No organomegaly or mass.  JP drain placed EXTREMITIES: No pedal edema, cyanosis, or clubbing.  PSYCHIATRIC: The patient is alert and oriented x name not place/time.  SKIN: No obvious rash, lesion, or ulcer.   DATA REVIEW:   CBC Recent Labs  Lab 09/11/18 0947  WBC 9.4  HGB 11.8*  HCT 36.0*  PLT 188    Chemistries  Recent Labs  Lab 09/10/18 0341  NA 135  K 3.3*  CL 102  CO2 24  GLUCOSE 115*  BUN 25*  CREATININE 0.97  CALCIUM 8.0*  MG 1.8  AST 459*  ALT 40  ALKPHOS 146*  BILITOT 3.2*    Cardiac Enzymes No results for input(s): TROPONINI in the last 168 hours.  Microbiology Results  @MICRORSLT48 @  RADIOLOGY:  Mr 3d Recon At Scanner  Result Date: 09/10/2018 CLINICAL DATA:  Abnormal liver function tests, acute cholecystitis. EXAM: MRI ABDOMEN WITHOUT AND WITH CONTRAST (INCLUDING MRCP) TECHNIQUE: Multiplanar multisequence MR imaging of the abdomen was performed both before and after the administration of intravenous contrast. Heavily T2-weighted images of the biliary and pancreatic ducts were obtained, and three-dimensional MRCP images were rendered by post processing. CONTRAST:  9 cc Gadavist COMPARISON:  CT abdomen 09/08/2018 FINDINGS: Despite efforts by the technologist and  patient, motion artifact is present on today's exam and could not be eliminated. This reduces exam sensitivity and specificity. Lower chest: Trace bilateral pleural effusions. Atelectasis in both lung bases, mildly upper portion to the pleural effusions. Mild cardiomegaly. Small hiatal hernia. Hepatobiliary: Prominent wall thickening moderate distention of the gallbladder with irregular sludge, gallstones, and possibly debris within the gallbladder. There is some accentuated early enhancement in the liver immediately adjacent to the gallbladder fossa which can sometimes be seen in the setting of cholecystitis. No obvious abnormal enhancement along the biliary tree. Pancreas:  Unremarkable.  No dorsal pancreatic duct dilatation. Spleen:  Unremarkable Adrenals/Urinary Tract:  Both adrenal glands appear normal. Numerous bilateral renal cysts of varying complexity are present. On subtraction images I do not see any significant degree of worrisome enhancement within these lesions. Stomach/Bowel: Unremarkable Vascular/Lymphatic: Aortoiliac atherosclerotic vascular disease. No pathologic adenopathy. Other: Pericholecystic edema with trace ascites in the right paracolic  gutter. Musculoskeletal: Lumbar spondylosis and degenerative disc disease. IMPRESSION: 1. Considerable wall thickening in the gallbladder with sludge and gallstones along with some mucosal irregularity, pericholecystic edema, and some accentuated enhancement in the adjacent portion of the liver. The appearance is compatible with patient's clinical diagnosis of acute cholecystitis. 2. No biliary dilatation, abnormal biliary duct enhancement, or filling defect in the extrahepatic biliary tree. No choledocholithiasis. 3. Numerous bilateral renal cysts of varying complexity, but without appreciable abnormal enhancement on subtraction images. 4.  Aortic Atherosclerosis (ICD10-I70.0). 5. Trace bilateral pleural effusions with bibasilar atelectasis. 6. Small hiatal  hernia. 7. Lumbar spondylosis and degenerative disc disease. Electronically Signed   By: Van Clines M.D.   On: 09/10/2018 14:02   Mr Abdomen Mrcp Moise Boring Contast  Result Date: 09/10/2018 CLINICAL DATA:  Abnormal liver function tests, acute cholecystitis. EXAM: MRI ABDOMEN WITHOUT AND WITH CONTRAST (INCLUDING MRCP) TECHNIQUE: Multiplanar multisequence MR imaging of the abdomen was performed both before and after the administration of intravenous contrast. Heavily T2-weighted images of the biliary and pancreatic ducts were obtained, and three-dimensional MRCP images were rendered by post processing. CONTRAST:  9 cc Gadavist COMPARISON:  CT abdomen 09/08/2018 FINDINGS: Despite efforts by the technologist and patient, motion artifact is present on today's exam and could not be eliminated. This reduces exam sensitivity and specificity. Lower chest: Trace bilateral pleural effusions. Atelectasis in both lung bases, mildly upper portion to the pleural effusions. Mild cardiomegaly. Small hiatal hernia. Hepatobiliary: Prominent wall thickening moderate distention of the gallbladder with irregular sludge, gallstones, and possibly debris within the gallbladder. There is some accentuated early enhancement in the liver immediately adjacent to the gallbladder fossa which can sometimes be seen in the setting of cholecystitis. No obvious abnormal enhancement along the biliary tree. Pancreas:  Unremarkable.  No dorsal pancreatic duct dilatation. Spleen:  Unremarkable Adrenals/Urinary Tract:  Both adrenal glands appear normal. Numerous bilateral renal cysts of varying complexity are present. On subtraction images I do not see any significant degree of worrisome enhancement within these lesions. Stomach/Bowel: Unremarkable Vascular/Lymphatic: Aortoiliac atherosclerotic vascular disease. No pathologic adenopathy. Other: Pericholecystic edema with trace ascites in the right paracolic gutter. Musculoskeletal: Lumbar spondylosis  and degenerative disc disease. IMPRESSION: 1. Considerable wall thickening in the gallbladder with sludge and gallstones along with some mucosal irregularity, pericholecystic edema, and some accentuated enhancement in the adjacent portion of the liver. The appearance is compatible with patient's clinical diagnosis of acute cholecystitis. 2. No biliary dilatation, abnormal biliary duct enhancement, or filling defect in the extrahepatic biliary tree. No choledocholithiasis. 3. Numerous bilateral renal cysts of varying complexity, but without appreciable abnormal enhancement on subtraction images. 4.  Aortic Atherosclerosis (ICD10-I70.0). 5. Trace bilateral pleural effusions with bibasilar atelectasis. 6. Small hiatal hernia. 7. Lumbar spondylosis and degenerative disc disease. Electronically Signed   By: Van Clines M.D.   On: 09/10/2018 14:02      Allergies as of 09/11/2018      Reactions   Antihistamines, Chlorpheniramine-type Other (See Comments)   Can't urinate      Medication List    TAKE these medications   amoxicillin-clavulanate 875-125 MG tablet Commonly known as: Augmentin Take 1 tablet by mouth 2 (two) times daily.   aspirin EC 81 MG tablet Take 81 mg by mouth daily.   carbidopa-levodopa 25-100 MG tablet Commonly known as: SINEMET IR Take 1 tablet by mouth 3 (three) times daily.   cholecalciferol 1000 units tablet Commonly known as: VITAMIN D Take 1,000 Units by mouth daily.   Co  Q 10 100 MG Caps Take 1 capsule by mouth daily.   enalapril 20 MG tablet Commonly known as: VASOTEC TAKE 1 TABLET BY MOUTH TWICE A DAY   Fish Oil 1200 MG Caps Take by mouth.   fluticasone 50 MCG/ACT nasal spray Commonly known as: FLONASE Place 2 sprays into both nostrils daily.   furosemide 20 MG tablet Commonly known as: LASIX Take 1 tablet (20 mg total) by mouth daily as needed for edema. For 1 week take 2 pills in morning then resume normal dose.   hydrochlorothiazide 12.5 MG  tablet Commonly known as: HYDRODIURIL Take 1 tablet (12.5 mg total) by mouth daily.   meloxicam 15 MG tablet Commonly known as: MOBIC TAKE 1 TABLET (15 MG TOTAL) BY MOUTH DAILY AS NEEDED (MODERATE BACK PAIN).   tamsulosin 0.4 MG Caps capsule Commonly known as: FLOMAX Take 1 capsule (0.4 mg total) by mouth daily. Start taking on: September 12, 2018   vitamin C 500 MG tablet Commonly known as: ASCORBIC ACID Take 500 mg by mouth daily.            Durable Medical Equipment  (From admission, onward)         Start     Ordered   09/11/18 1005  For home use only DME Gilford Rile  [facility])  Once    Question:  Patient needs a walker to treat with the following condition  Answer:  Weakness   09/11/18 1005           Management plans discussed with the patient's daughter and she is in agreement. Stable for discharge   Patient should follow up with surgery  CODE STATUS:     Code Status Orders  (From admission, onward)         Start     Ordered   09/09/18 0418  Full code  Continuous     09/09/18 0417        Code Status History    This patient has a current code status but no historical code status.   Advance Care Planning Activity      TOTAL TIME TAKING CARE OF THIS PATIENT: 39 minutes.    Note: This dictation was prepared with Dragon dictation along with smaller phrase technology. Any transcriptional errors that result from this process are unintentional.  Bettey Costa M.D on 09/11/2018 at 11:11 AM  Between 7am to 6pm - Pager - [phone] After 6pm go to [email or web address] - password [email or web address] [place] [facility]  Office  [phone]  CC: Primary care physician; [name], DO

## 2018-09-11 NOTE — TOC Transition Note (Signed)
Transition of Care [facility]) - CM/SW Discharge Note   Patient Details  Name: CODDY MANSIR MRN: [id number] Date of Birth: 04/24/1934  Transition of [facility]) CM/SW Contact:  Beverly Sessions, RN Phone Number: 09/11/2018, 12:49 PM   Clinical Narrative:    Patient admitted from home with acute cholecystitis  Patient lives at home with daughter and wife.  Daughter at bedside. Daughter provides transportation  PCP. [place].  Denies any issues obtaining medications   Patient has a rollator and Cane in the home  PT has assessed patient and recommends home health and RW.  Patient agreeable  CMS Medicare.gov Compare Post Acute Care list reviewed with with daughter and patient.  They do not have a preference of agency.  Referral made to Cassie with [facility]  Brad with [facility] to deliver RW and [facility] to room prior to discharge    Final next level of care: [place] Barriers to Discharge: No Barriers Identified   Patient Goals and CMS Choice   CMS Medicare.gov Compare Post Acute Care list provided to:: Patient Choice offered to / list presented to : Patient, Adult Children  Discharge Placement                       Discharge Plan and Services   Discharge Planning Services: CM Consult Post Acute Care Choice: Home Health, Durable Medical Equipment          DME Arranged: Walker rolling, Bedside commode DME Agency: [facility] Date DME Agency Contacted: 09/11/18 Time DME Agency Contacted: [id number] Representative spoke with at DME Agency: [place]: RN, PT, Nurse's Aide, Social Work [place] Date [place]: 09/11/18 Time [place]: [place] Representative spoke with at [place]: Cassie  Social Determinants of Health ([place]) Interventions     Readmission Risk Interventions No flowsheet data found.

## 2018-09-11 NOTE — Op Note (Signed)
Preoperative diagnosis:  acute and cholecystitis, gangernous  Postoperative diagnosis: same as above  Procedure: Laparoscopic Cholecystectomy.   Anesthesia: GETA   Surgeon: Benjamine Sprague  Specimen: Gallbladder  Complications: None  EBL: 222mL  Wound Classification: Clean Contaminated  Indications: see HPI  Findings: Critical view of safety noted Cystic duct and artery identified, ligated and divided, clips remained intact at end of procedure Adequate hemostasis  Description of procedure: The patient was placed on the operating table in the supine position. SCDs placed, pre-op abx administered.  General anesthesia was induced and OG tube placed by anesthesia. A time-out was completed verifying correct patient, procedure, site, positioning, and implant(s) and/or special equipment prior to beginning this procedure. The abdomen was prepped and draped in the usual sterile fashion.  An incision was made in a natural skin line under the umbilicus.  Dissection carried down to fascia where two 0 vicryl sutures placed to use as anchor sutures for hasson port.  Incision made into fascia and blunt dissection used to enter peritoneum.  Hasson port placed and insufflation started up to 47mm Hg without any dramatic increase in pressure.    The laparoscope was inserted and the abdomen inspected. No injuries from initial trocar placement were noted. Additional trocars were then inserted under direct visualization in the following locations: a 5-mm trocar in the subxyphoid region and two 5-mm trocars along the right costal margin. The abdomen was inspected and no abnormalities or injuries were found. The table was placed in the reverse Trendelenburg position with the right side up.   Very dense adhesions between the gangernous gallbladder and omentum, duodenum and transverse colon were lysed sharply. The dome of the gallbladder was grasped with an atraumatic grasper after decompression with needle and  passed through the lateral port and retracted over the dome of the liver. The infundibulum was also grasped with an atraumatic grasper and retracted toward the right lower quadrant. Extensive dissection that took the majority of time in OR then initiated to eventually expose calot's triangle.   The peritoneum overlying the gallbladder infundibulum was then dissected and the cystic duct and cystic artery identified.  Critical view of safety with the liver bed clearly visible behind the duct and artery with no additional structures noted.  Picture taken before the cystic duct and cystic artery clipped and divided close to the gallbladder.   The gallbladder was then dissected from its peritoneal and liver bed attachments by electrocautery with much difficulty due to gangernous nature and lack of a plane between gallbladder and liver.  Hemostasis was checked and the gallbladder was eventually removed using an endoscopic retrieval bag placed through the umbilical port. The gallbladder was passed off the table as a specimen. The gallbladder fossa was copiously irrigated with saline and leaked bile along with the sludge was suctioned out, and hemostasis was obtained. There was no evidence of bleeding from the gallbladder fossa or cystic artery or leakage of the bile from the cystic duct stump.   Blake drain placed through far lateral port after arista powder infused in the fossa.   Abdomen desufflated and secondary trocars were removed under direct vision. The umbilical trocar removed and port site closed 0 vicryl figure of eight under direct vision.  No bleeding was noted.  3-0 vicryl used to close deep dermal layer at umbilical site.  All skin incisions then closed with subcuticular sutures of 4-0 monocryl and dressed with topical skin adhesive. The orogastric tube was removed and patient extubated. The patient tolerated  the procedure well and was taken to the postanesthesia care unit in stable condition.  All  sponge and instrument count correct at end of procedure.

## 2018-09-11 NOTE — Progress Notes (Signed)
Subjective:  CC: Randy [name] is a 84 y.o. male  Hospital stay day 3, acute cholecystitis  HPI: No acute issues overnight.    ROS:  General: Denies weight loss, weight gain, fatigue, fevers, chills, and night sweats. Heart: Denies chest pain, palpitations, racing heart, irregular heartbeat, leg pain or swelling, and decreased activity tolerance. Respiratory: Denies breathing difficulty, shortness of breath, wheezing, cough, and sputum. GI: Denies change in appetite, heartburn, nausea, vomiting, constipation, diarrhea, and blood in stool. GU: Denies difficulty urinating, pain with urinating, urgency, frequency, blood in urine.   Objective:   Temp:  [97.1 F (36.2 C)-99.3 F (37.4 C)] 97.6 F (36.4 C) (09/04 0840) Pulse Rate:  [70-94] 70 (09/04 0840) Resp:  [16-22] 20 (09/04 0840) BP: (129-164)/(74-89) 153/75 (09/04 0840) SpO2:  [90 %-98 %] 91 % (09/04 0840)     Height: 6' (182.9 cm) Weight: 90.7 kg BMI (Calculated): 27.12   Intake/Output this shift:   Intake/Output Summary (Last 24 hours) at 09/11/2018 1103 Last data filed at 09/11/2018 1009 Gross per 24 hour  Intake 1330.42 ml  Output 1780 ml  Net -449.58 ml    Constitutional :  alert, cooperative, appears stated age and no distress  Respiratory:  clear to auscultation bilaterally  Cardiovascular:  regular rate and rhythm  Gastrointestinal: soft, no guarding, incisions clean dry and intact.  Minimal tenderness around incision sites.  JP drain with serosanguineous output.   Skin: Cool and moist.   Psychiatric: Normal affect, non-agitated, not confused       LABS:  CMP Latest Ref Rng & Units 09/10/2018 09/09/2018 09/08/2018  Glucose 70 - 99 mg/dL 115(H) 101(H) 117(H)  BUN 8 - 23 mg/dL 25(H) 22 26(H)  Creatinine 0.61 - 1.24 mg/dL 0.97 1.01 0.99  Sodium 135 - 145 mmol/L 135 137 138  Potassium 3.5 - 5.1 mmol/L 3.3(L) 3.2(L) 3.8  Chloride 98 - 111 mmol/L 102 99 97(L)  CO2 22 - 32 mmol/L 24 27 30   Calcium 8.9 - 10.3 mg/dL 8.0(L)  8.4(L) 9.0  Total Protein 6.5 - 8.1 g/dL 5.9(L) 6.4(L) 7.5  Total Bilirubin 0.3 - 1.2 mg/dL 3.2(H) 2.0(H) 1.3(H)  Alkaline Phos 38 - 126 U/L 146(H) 51 70  AST 15 - 41 U/L 459(H) 25 24  ALT 0 - 44 U/L 40 17 11   CBC Latest Ref Rng & Units 09/11/2018 09/10/2018 09/09/2018  WBC 4.0 - 10.5 K/uL 9.4 15.3(H) 18.1(H)  Hemoglobin 13.0 - 17.0 g/dL 11.8(L) 12.0(L) 13.4  Hematocrit 39.0 - 52.0 % 36.0(L) 36.1(L) 39.4  Platelets 150 - 400 K/uL 188 161 193    RADS: CLINICAL DATA:  Abnormal liver function tests, acute cholecystitis.  EXAM: MRI ABDOMEN WITHOUT AND WITH CONTRAST (INCLUDING MRCP)  TECHNIQUE: Multiplanar multisequence MR imaging of the abdomen was performed both before and after the administration of intravenous contrast. Heavily T2-weighted images of the biliary and pancreatic ducts were obtained, and three-dimensional MRCP images were rendered by post processing.  CONTRAST:  9 cc Gadavist  COMPARISON:  CT abdomen 09/08/2018  FINDINGS: Despite efforts by the technologist and patient, motion artifact is present on today's exam and could not be eliminated. This reduces exam sensitivity and specificity.  Lower chest: Trace bilateral pleural effusions. Atelectasis in both lung bases, mildly upper portion to the pleural effusions. Mild cardiomegaly. Small hiatal hernia.  Hepatobiliary: Prominent wall thickening moderate distention of the gallbladder with irregular sludge, gallstones, and possibly debris within the gallbladder. There is some accentuated early enhancement in the liver immediately adjacent to  the gallbladder fossa which can sometimes be seen in the setting of cholecystitis. No obvious abnormal enhancement along the biliary tree.  Pancreas:  Unremarkable.  No dorsal pancreatic duct dilatation.  Spleen:  Unremarkable  Adrenals/Urinary Tract:  Both adrenal glands appear normal.  Numerous bilateral renal cysts of varying complexity are present.  On subtraction images I do not see any significant degree of worrisome enhancement within these lesions.  Stomach/Bowel: Unremarkable  Vascular/Lymphatic: Aortoiliac atherosclerotic vascular disease. No pathologic adenopathy.  Other: Pericholecystic edema with trace ascites in the right paracolic gutter.  Musculoskeletal: Lumbar spondylosis and degenerative disc disease.  IMPRESSION: 1. Considerable wall thickening in the gallbladder with sludge and gallstones along with some mucosal irregularity, pericholecystic edema, and some accentuated enhancement in the adjacent portion of the liver. The appearance is compatible with patient's clinical diagnosis of acute cholecystitis. 2. No biliary dilatation, abnormal biliary duct enhancement, or filling defect in the extrahepatic biliary tree. No choledocholithiasis. 3. Numerous bilateral renal cysts of varying complexity, but without appreciable abnormal enhancement on subtraction images. 4.  Aortic Atherosclerosis (ICD10-I70.0). 5. Trace bilateral pleural effusions with bibasilar atelectasis. 6. Small hiatal hernia. 7. Lumbar spondylosis and degenerative disc disease.   Electronically Signed   By: Van Clines M.D. Assessment:   Status post lap chole for acute cholecystitis.  JP drain placed due to difficult gallbladder.  Draining serosanguineous fluid, patient's pain improved, tolerating clear liquid diet. Patient is okay to be discharged from surgery standpoint after confirming home health arrangements are made to help with ADLs.  He will follow-up in 1 week with my office for wound check and possible drain removal.  Continue home antibiotics due to gangrenous gallbladder.

## 2018-09-11 NOTE — Consult Note (Signed)
Pharmacy Antibiotic Note  PHAROH LEBAR is a 84 y.o. male admitted on 09/08/2018 with intra-abdominal infection.  Pharmacy has been consulted for Zosyn dosing. This is day #4 of antibiotics, leukocytosis improved somewhat, afebrile since last note  Plan: Zosyn 3.375g IV q8h (4 hour infusion).  Height: 6' (182.9 cm) Weight: 200 lb (90.7 kg) IBW/kg (Calculated) : 77.6  Temp (24hrs), Avg:97.9 F (36.6 C), Min:97.1 F (36.2 C), Max:99.3 F (37.4 C)  Recent Labs  Lab 09/08/18 1833 09/08/18 1835 09/09/18 0438 09/09/18 0620 09/10/18 0341  WBC 13.0*  --  18.1*  --  15.3*  CREATININE 0.99  --   --  1.01 0.97  LATICACIDVEN  --  0.9  --   --   --     Estimated Creatinine Clearance: 64.4 mL/min (by C-G formula based on SCr of 0.97 mg/dL).    Antimicrobials this admission: Zosyn 9/1 >>   Microbiology results: 9/1 BCx: NGTD   Thank you for allowing pharmacy to be a part of this patient's care.  Dallie Piles, PharmD 09/11/2018 7:43 AM

## 2018-09-11 NOTE — Care Management Important Message (Signed)
Important Message  Patient Details  Name: Randy [name] MRN: [id number] Date of Birth: 1934-09-19   Medicare Important Message Given:  Yes     Dannette Barbara 09/11/2018, 12:00 PM

## 2018-09-11 NOTE — Progress Notes (Addendum)
Discharge order received. Patient mental status is at baseline. Vital signs stable . No signs of acute distress. Discharge instructions given to family member.  Pt. was unable to urinate post foley removal. Bladder scan shows 650 mL. Order received to re-insert foley cath and pt. went home with it. Daughter was educated on foley catheter care, and JP drain care/output. Daughter was made aware that pt has to follow up with primary care per attending MD regarding foley. No other issues at this time.

## 2018-09-11 NOTE — Progress Notes (Signed)
Patients daughter instructed on care of jp drain and emptying with teach back. Daughter demonstrated how to appropriately empty and strip drain.   Alphonsa Gin, [facility] Student Nurse

## 2018-09-13 LAB — CULTURE, BLOOD (SINGLE)
Culture: NO GROWTH
Special Requests: ADEQUATE

## 2018-09-15 ENCOUNTER — Telehealth: Payer: Self-pay | Admitting: Family Medicine

## 2018-09-15 ENCOUNTER — Ambulatory Visit: Payer: Self-pay | Admitting: Pharmacist

## 2018-09-15 DIAGNOSIS — I10 Essential (primary) hypertension: Secondary | ICD-10-CM

## 2018-09-15 LAB — SURGICAL PATHOLOGY

## 2018-09-15 NOTE — Telephone Encounter (Signed)
Daughter called requesting something to be called in for pt being restless at nite

## 2018-09-15 NOTE — Patient Instructions (Signed)
Thank you allowing the Chronic Care Management Team to be a part of your care! It was a pleasure speaking with you today!     CCM (Chronic Care Management) Team    Janci Minor RN, BSN Nurse Care Coordinator  [phone]   Randy [name] PharmD  Clinical Pharmacist  [phone]   Eula Fried LCSW Clinical Social Worker [phone]  Visit Information  Goals Addressed            This Visit's Progress   . Medication Management       Current Barriers:  . Cognitive Deficits  Pharmacist Clinical Goal(s):  [name] Over the next 30 days, patient/caregiver will work with CM Pharmacist to address needs related to medication management and blood pressure mangement  Interventions: . Randy [name] admitted from 9/1 to 9/4 for acute cholecystitis (cholecystectomy performed 9/3) o Patient was recently discharged from hospital and all medications have been reviewed with his daughter/caregiver o Medications:  - New at Discharge: . Amoxicillin-clavulanate 875-125 mg twice daily . Tamsulosin 0.4 mg once daily - Discontinued at Discharge: none . Counsel on importance of completing amoxicillin-clavulanate course. Reports taking with food to aid with tolerability. o As previously noted, patient not currently taking aspirin. Previously discussed with PCP following 08/06/18 medication review. Per PCP, Okay for patient to remain off of aspirin 81 mg at that time if unable to tolerate due to bruising, particularly given his arthritis/knees etc, high fall risk. o Daughter reports patient taking meloxicam once daily prn, but more often since dicharge due to pain related to surgery.  - Counsel about importance of following up with PCP regarding pain and use of meloxicam at appointment tomorrow . Daughter denies regularly checking blood pressure and keeping log since patient discharged from hospital. However, reports BP today, as taken by home health PT, was 128/80, HR 74  Patient Self Care Activities:   . Patient to take medications as directed with assistance of daughter Randy [name] to aid [name] with filling his weekly pillbox and monitoring for adherence . Attends all scheduled provider appointments o Post-discharge follow up appointment with PCP on 9/9 . Calls pharmacy for medication refills . Calls provider office for new concerns or questions . To check blood pressure daily and keep log  Please see past updates related to this goal by clicking on the "Past Updates" button in the selected goal         The patient verbalized understanding of instructions provided today and declined a print copy of patient instruction materials.   The care management team will reach out to the patient again over the next 7 days.   Randy [name], PharmD, [place] [employer] [phone]

## 2018-09-15 NOTE — Telephone Encounter (Signed)
Ewald with [facility] is requesting verbal order.  Rehaan call back # is [phone]

## 2018-09-15 NOTE — Telephone Encounter (Signed)
Patient is followed by Dr Manuella Ghazi at [facility] for Lewy Body Dementia problem.  I would recommend that he contact them first to determine what treatment they can offer.  Randy [name], [facility] 09/15/2018, 3:57 PM

## 2018-09-15 NOTE — Chronic Care Management (AMB) (Signed)
Chronic Care Management   Follow Up Note   09/15/2018 Name: Randy [name] MRN: [id number] DOB: 29-Feb-1934  Referred by: Randy Hauser, DO Reason for referral : Chronic Care Management (Caregiver Phone Call)   Randy [name] is a 84 y.o. year old male who is a primary care patient of Randy Hauser, DO. The CCM team was consulted for assistance with chronic disease management and care coordination needs.  Randy [name] has a past medical history including, but not limited to: hypertension, osteoarthritis, hyperlipidemia and memory loss.  I reached out to patient's daughter/caregiver, Randy [name].   Review of patient status, including review of consultants reports, relevant laboratory and other test results, and collaboration with appropriate care team members and the patient's provider was performed as part of comprehensive patient evaluation and provision of chronic care management services.     Outpatient Encounter Medications as of 09/15/2018  Medication Sig Note  . amoxicillin-clavulanate (AUGMENTIN) 875-125 MG tablet Take 1 tablet by mouth 2 (two) times daily.   . carbidopa-levodopa (SINEMET IR) 25-100 MG tablet Take 1 tablet by mouth 3 (three) times daily.   . cholecalciferol (VITAMIN D) 1000 units tablet Take 1,000 Units by mouth daily.   . Coenzyme Q10 (CO Q 10) 100 MG CAPS Take 1 capsule by mouth daily.    . enalapril (VASOTEC) 20 MG tablet TAKE 1 TABLET BY MOUTH TWICE A DAY   . fluticasone (FLONASE) 50 MCG/ACT nasal spray Place 2 sprays into both nostrils daily.  08/06/2018: As needed  . hydrochlorothiazide (HYDRODIURIL) 12.5 MG tablet Take 1 tablet (12.5 mg total) by mouth daily.   . meloxicam (MOBIC) 15 MG tablet TAKE 1 TABLET (15 MG TOTAL) BY MOUTH DAILY AS NEEDED (MODERATE BACK PAIN).   [name] tamsulosin (FLOMAX) 0.4 MG CAPS capsule Take 1 capsule (0.4 mg total) by mouth daily.   . vitamin C (ASCORBIC ACID) 500 MG tablet Take 500 mg by mouth daily.   [name] aspirin EC 81 MG  tablet Take 81 mg by mouth daily.   . furosemide (LASIX) 20 MG tablet Take 1 tablet (20 mg total) by mouth daily as needed for edema. For 1 week take 2 pills in morning then resume normal dose. (Patient not taking: Reported on 06/17/2018) 09/11/2016: Only as needed, rarely needs  . Omega-3 Fatty Acids (FISH OIL) 1200 MG CAPS Take by mouth.    No facility-administered encounter medications on file as of 09/15/2018.     Goals Addressed            This Visit's Progress   . Medication Management       Current Barriers:  . Cognitive Deficits  Pharmacist Clinical Goal(s):  [name] Over the next 30 days, patient/caregiver will work with CM Pharmacist to address needs related to medication management and blood pressure mangement  Interventions: . Randy [name] admitted from 9/1 to 9/4 for acute cholecystitis (cholecystectomy performed 9/3) o Patient was recently discharged from hospital and all medications have been reviewed with his daughter/caregiver o Medications:  - New at Discharge: . Amoxicillin-clavulanate 875-125 mg twice daily . Tamsulosin 0.4 mg once daily - Discontinued at Discharge: none . Counsel on importance of completing amoxicillin-clavulanate course. Reports taking with food to aid with tolerability. o As previously noted, patient not currently taking aspirin. Previously discussed with PCP following 08/06/18 medication review. Per PCP, Okay for patient to remain off of aspirin 81 mg at that time if unable to tolerate due to bruising, particularly given his arthritis/knees etc,  high fall risk. o Daughter reports patient taking meloxicam once daily prn, but more often since dicharge due to pain related to surgery.  - Counsel about importance of following up with PCP regarding pain and use of meloxicam at appointment tomorrow . Daughter denies regularly checking blood pressure and keeping log since patient discharged from hospital. However, reports BP today, as taken by home health PT, was  128/80, HR 74  Patient Self Care Activities:  . Patient to take medications as directed with assistance of daughter Randy [name] to aid [name] with filling his weekly pillbox and monitoring for adherence . Attends all scheduled provider appointments o Post-discharge follow up appointment with PCP on 9/9 . Calls pharmacy for medication refills . Calls provider office for new concerns or questions . To check blood pressure daily and keep log  Please see past updates related to this goal by clicking on the "Past Updates" button in the selected goal         Plan  The care management team will reach out to the patient again over the next 7 days.   [name], PharmD, [place] [employer] [phone]

## 2018-09-15 NOTE — Telephone Encounter (Signed)
Left message

## 2018-09-16 ENCOUNTER — Other Ambulatory Visit: Payer: Self-pay

## 2018-09-16 ENCOUNTER — Ambulatory Visit (INDEPENDENT_AMBULATORY_CARE_PROVIDER_SITE_OTHER): Payer: Medicare Other | Admitting: Family Medicine

## 2018-09-16 ENCOUNTER — Encounter: Payer: Self-pay | Admitting: Family Medicine

## 2018-09-16 ENCOUNTER — Telehealth: Payer: Self-pay | Admitting: Family Medicine

## 2018-09-16 DIAGNOSIS — Z960 Presence of urogenital implants: Secondary | ICD-10-CM

## 2018-09-16 DIAGNOSIS — M1712 Unilateral primary osteoarthritis, left knee: Secondary | ICD-10-CM

## 2018-09-16 DIAGNOSIS — Z9049 Acquired absence of other specified parts of digestive tract: Secondary | ICD-10-CM

## 2018-09-16 DIAGNOSIS — N400 Enlarged prostate without lower urinary tract symptoms: Secondary | ICD-10-CM

## 2018-09-16 DIAGNOSIS — Z978 Presence of other specified devices: Secondary | ICD-10-CM

## 2018-09-16 DIAGNOSIS — R339 Retention of urine, unspecified: Secondary | ICD-10-CM | POA: Diagnosis not present

## 2018-09-16 NOTE — Progress Notes (Signed)
Virtual Visit via Telephone The purpose of this virtual visit is to provide medical care while limiting exposure to the novel coronavirus (COVID19) for both patient and office staff.  Consent was obtained for phone visit:  Yes.   Answered questions that patient had about telehealth interaction:  Yes.   I discussed the limitations, risks, security and privacy concerns of performing an evaluation and management service by telephone. I also discussed with the patient that there may be a patient responsible charge related to this service. The patient expressed understanding and agreed to proceed.  Patient Location: Home Provider Location: [facility])  ---------------------------------------------------------------------- Chief Complaint  Patient presents with  . Hospitalization Follow-up    cholecystectomy     S: Reviewed CMA documentation. I have called patient and spoke with patient and spouse Lelon Frohlich, gathered additional HPI as follows:  HOSPITAL FOLLOW-UP VISIT  Hospital/Location: [facility] Date of Admission: 09/08/18 Date of Discharge: 09/11/18 Transitions of care telephone call: not completed.  Reason for Admission / diagnosis: altered mental status, dementia, acute cholecystitis  FOLLOW-UP  - Hospital H&P and Discharge Summary have been reviewed - Patient presents today about 5 days after recent hospitalization. Brief summary of recent course, patient had symptoms of acute altered mental status confusion with dementia with inc confusion, hospitalized after work up on imaging CT showed acute cholecystitis, treated with consultation to surgery, IV antibiotics, and proceeded to lap cholecystectomy on 09/10/18, had work up for AMS including MRI brain ruled out CVA and other etiology, also with bladder distention with known BPH and urinary retention, started on flomax had bladder scan, and ultimately required replaced foley catheter after failed voiding trial prior to DC  home.  - Today reports overall has done well after discharge. Symptoms of confusion have are much improved - He has f/u with General Surgery on 09/22/18 for jp drain eval removal - He still need Urology follow-up due to foley catheter still in place on discharge due to urinary retention  - New medications on discharge: Augmentin finish oral antibiotic course   I have reviewed the discharge medication list, and have reconciled the current and discharge medications today.  Additional complaint  Knees Arthritis, bilateral, chronic pain Previous visits for similar issue chronic knee pain arthritis, see background info - In past has had prior knee injections at our office, Left Knee 01/2018, Right knee 02/2018 - worked for 6-8 months, reduced pain, now requesting to schedule for knee inj again  Lewy Body Dementia / Parkinsonian features - Initial consult on 07/29/18 [facility] Neuro Dr Manuella Ghazi, had work up and MRI 08/12/18, see results, dx with parkinsonian features and treated with sinemet. Now complaining of agitation and restlessness at night and difficulty sleeping some insomnia  Denies any high risk travel to areas of current concern for COVID19. Denies any known or suspected exposure to person with or possibly with COVID19.  Denies any fevers, chills, sweats, body ache, cough, shortness of breath, sinus pain or pressure, headache, abdominal pain, diarrhea  Past Medical History:  Diagnosis Date  . GERD (gastroesophageal reflux disease)   . Hyperlipidemia   . Hypertension   . Osteoporosis    Social History   Tobacco Use  . Smoking status: Former Smoker    Packs/day: 1.00    Years: 12.00    Pack years: 12.00    Types: Cigarettes    Quit date: 1968    Years since quitting: 52.7  . Smokeless tobacco: Former Network engineer Use Topics  .  Alcohol use: No  . Drug use: No    Current Outpatient Medications:  .  carbidopa-levodopa (SINEMET IR) 25-100 MG tablet, Take 1 tablet by mouth 3 (three)  times daily., Disp: , Rfl:  .  cholecalciferol (VITAMIN D) 1000 units tablet, Take 1,000 Units by mouth daily., Disp: , Rfl:  .  Coenzyme Q10 (CO Q 10) 100 MG CAPS, Take 1 capsule by mouth daily. , Disp: , Rfl:  .  enalapril (VASOTEC) 20 MG tablet, TAKE 1 TABLET BY MOUTH TWICE A DAY, Disp: 180 tablet, Rfl: 3 .  fluticasone (FLONASE) 50 MCG/ACT nasal spray, Place 2 sprays into both nostrils daily. , Disp: , Rfl:  .  furosemide (LASIX) 20 MG tablet, Take 1 tablet (20 mg total) by mouth daily as needed for edema. For 1 week take 2 pills in morning then resume normal dose., Disp: 45 tablet, Rfl: 2 .  hydrochlorothiazide (HYDRODIURIL) 12.5 MG tablet, Take 1 tablet (12.5 mg total) by mouth daily., Disp: 90 tablet, Rfl: 1 .  meloxicam (MOBIC) 15 MG tablet, TAKE 1 TABLET (15 MG TOTAL) BY MOUTH DAILY AS NEEDED (MODERATE BACK PAIN)., Disp: 30 tablet, Rfl: 2 .  tamsulosin (FLOMAX) 0.4 MG CAPS capsule, Take 1 capsule (0.4 mg total) by mouth daily., Disp: 30 capsule, Rfl: 0 .  vitamin C (ASCORBIC ACID) 500 MG tablet, Take 500 mg by mouth daily., Disp: , Rfl:  .  aspirin EC 81 MG tablet, Take 81 mg by mouth daily., Disp: , Rfl:  .  Omega-3 Fatty Acids (FISH OIL) 1200 MG CAPS, Take by mouth., Disp: , Rfl:   Depression screen [facility] 2/9 09/16/2018 06/17/2018 04/21/2018  Decreased Interest 0 0 0  Down, Depressed, Hopeless 0 0 0  PHQ - 2 Score 0 0 0    No flowsheet data found.  -------------------------------------------------------------------------- O: No physical exam performed due to remote telephone encounter.  Lab results reviewed.  I have personally reviewed the radiology report  MR BRAIN [place] [id number] Resulted: 08/12/18 1527  Order Status: Completed Updated: 08/12/18 1529  Narrative:   CLINICAL DATA: Memory loss. Possible Lewy body dementia. Symptoms  began 2017.   EXAM:  MRI HEAD WITHOUT CONTRAST   TECHNIQUE:  Multiplanar, multiecho pulse sequences of the brain and surrounding   structures were obtained without intravenous contrast.   Additionally, using NeuroQuant software a 3D volumetric analysis of  the brain was performed and is compared to a normative database  adjusted for age, gender and intracranial volume.   COMPARISON: None.   FINDINGS:  Brain: Diffusion imaging does not show any acute or subacute  infarction. Brain atrophy as outlined above. Minimal small vessel  change of the pons. No focal cerebellar insult. Cerebral hemispheres  show mild to moderate chronic small-vessel ischemic changes of the  white matter no cortical or large vessel territory infarction. No  mass lesion, hemorrhage, hydrocephalus or extra-axial collection.   Vascular: Major vessels at the base of the brain show flow.   Skull and upper cervical spine: Negative   Sinuses/Orbits: Opacification of the left frontal sinus with  expansion. There appears to be cortical thinning of the posterior  wall. Consider sinus CT to evaluate this further, as there could be  some risk of intracranial extension.   Other: None   NeuroQuant Findings:   Volumetric analysis of the brain was performed, with a fully  detailed report in [place] PACS. Briefly, the comparison with age and  gender matched reference reveals whole brain volume at the  thirteenth percentile. There is gray matter more than white matter  volume loss, with gray matter at the fifteenth percentile and white  matter at the fiftieth percentile. White matter hyperintensities are  at the sixty-seventh percentile for age.   IMPRESSION:  1. Brain atrophy with overall cortical more than white matter volume  loss. Whole brain volume at thirteenth percentile. More than average  white matter hyperintensities.  2. NeuroQuant volumetric analysis of the brain full detail, see  details on [employer].  3. No acute brain finding. Mild to moderate chronic small-vessel  ischemic changes of the pons and cerebral hemispheric white  matter.    Electronically Signed  By: Nelson Chimes M.D.  On: 08/12/2018 15:27       Recent Results (from the past 2160 hour(s))  Comprehensive metabolic panel     Status: Abnormal   Collection Time: 09/08/18  6:33 PM  Result Value Ref Range   Sodium 138 135 - 145 mmol/L   Potassium 3.8 3.5 - 5.1 mmol/L   Chloride 97 (L) 98 - 111 mmol/L   CO2 30 22 - 32 mmol/L   Glucose, Bld 117 (H) 70 - 99 mg/dL   BUN 26 (H) 8 - 23 mg/dL   Creatinine, Ser 0.99 0.61 - 1.24 mg/dL   Calcium 9.0 8.9 - 10.3 mg/dL   Total Protein 7.5 6.5 - 8.1 g/dL   Albumin 3.9 3.5 - 5.0 g/dL   AST 24 15 - 41 U/L   ALT 11 0 - 44 U/L   Alkaline Phosphatase 70 38 - 126 U/L   Total Bilirubin 1.3 (H) 0.3 - 1.2 mg/dL   GFR calc non Af Amer >60 >60 mL/min   GFR calc Af Amer >60 >60 mL/min   Anion gap 11 5 - 15    Comment: Performed at [facility], [address] [place], [place] [address]  Lipase, blood     Status: None   Collection Time: 09/08/18  6:33 PM  Result Value Ref Range   Lipase 30 11 - 51 U/L    Comment: Performed at [facility], [place], [place] [address]  Troponin I (High Sensitivity)     Status: None   Collection Time: 09/08/18  6:33 PM  Result Value Ref Range   Troponin I (High Sensitivity) 10 <18 ng/L    Comment: (NOTE) Elevated high sensitivity troponin I (hsTnI) values and significant  changes across serial measurements may suggest ACS but many other  chronic and acute conditions are known to elevate hsTnI results.  Refer to the "Links" section for chest pain algorithms and additional  guidance. Performed at [facility], [address] [place], [place] [address]   CBC with Differential     Status: Abnormal   Collection Time: 09/08/18  6:33 PM  Result Value Ref Range   WBC 13.0 (H) 4.0 - 10.5 K/uL   RBC 4.83 4.22 - 5.81 MIL/uL   Hemoglobin 14.1 13.0 - 17.0 g/dL   HCT 42.0 39.0 - 52.0 %   MCV 87.0 80.0 - 100.0 fL   MCH 29.2 26.0 -  34.0 pg   MCHC 33.6 30.0 - 36.0 g/dL   RDW 13.9 11.5 - 15.5 %   Platelets 209 150 - 400 K/uL   nRBC 0.0 0.0 - 0.2 %   Neutrophils Relative % 88 %   Neutro Abs 11.3 (H) 1.7 - 7.7 K/uL   Lymphocytes Relative 6 %   Lymphs Abs 0.8 0.7 - 4.0  K/uL   Monocytes Relative 6 %   Monocytes Absolute 0.8 0.1 - 1.0 K/uL   Eosinophils Relative 0 %   Eosinophils Absolute 0.0 0.0 - 0.5 K/uL   Basophils Relative 0 %   Basophils Absolute 0.0 0.0 - 0.1 K/uL   Immature Granulocytes 0 %   Abs Immature Granulocytes 0.05 0.00 - 0.07 K/uL    Comment: Performed at [facility], [address] [place], [place] [address]  Lactic acid, plasma     Status: None   Collection Time: 09/08/18  6:35 PM  Result Value Ref Range   Lactic Acid, Venous 0.9 0.5 - 1.9 mmol/L    Comment: Performed at [facility], [place], [place] [address]  Troponin I (High Sensitivity)     Status: None   Collection Time: 09/08/18  8:35 PM  Result Value Ref Range   Troponin I (High Sensitivity) 10 <18 ng/L    Comment: (NOTE) Elevated high sensitivity troponin I (hsTnI) values and significant  changes across serial measurements may suggest ACS but many other  chronic and acute conditions are known to elevate hsTnI results.  Refer to the "Links" section for chest pain algorithms and additional  guidance. Performed at [facility], [address] [place], [place] [address]   SARS Coronavirus 2 [facility] order, Performed in [facility] hospital lab) Nasopharyngeal Nasopharyngeal Swab     Status: None   Collection Time: 09/08/18  9:45 PM   Specimen: Nasopharyngeal Swab  Result Value Ref Range   SARS Coronavirus 2 NEGATIVE NEGATIVE    Comment: (NOTE) If result is NEGATIVE SARS-CoV-2 target nucleic acids are NOT DETECTED. The SARS-CoV-2 RNA is generally detectable in upper and lower  respiratory specimens during the acute phase of infection. The lowest  concentration of SARS-CoV-2 viral  copies this assay can detect is 250  copies / mL. A negative result does not preclude SARS-CoV-2 infection  and should not be used as the sole basis for treatment or other  patient management decisions.  A negative result may occur with  improper specimen collection / handling, submission of specimen other  than nasopharyngeal swab, presence of viral mutation(s) within the  areas targeted by this assay, and inadequate number of viral copies  (<250 copies / mL). A negative result must be combined with clinical  observations, patient history, and epidemiological information. If result is POSITIVE SARS-CoV-2 target nucleic acids are DETECTED. The SARS-CoV-2 RNA is generally detectable in upper and lower  respiratory specimens dur ing the acute phase of infection.  Positive  results are indicative of active infection with SARS-CoV-2.  Clinical  correlation with patient history and other diagnostic information is  necessary to determine patient infection status.  Positive results do  not rule out bacterial infection or co-infection with other viruses. If result is PRESUMPTIVE POSTIVE SARS-CoV-2 nucleic acids MAY BE PRESENT.   A presumptive positive result was obtained on the submitted specimen  and confirmed on repeat testing.  While 2019 novel coronavirus  (SARS-CoV-2) nucleic acids may be present in the submitted sample  additional confirmatory testing may be necessary for epidemiological  and / or clinical management purposes  to differentiate between  SARS-CoV-2 and other Sarbecovirus currently known to infect humans.  If clinically indicated additional testing with an alternate test  methodology [phone] is advised. The SARS-CoV-2 RNA is generally  detectable in upper and lower respiratory sp ecimens during the acute  phase of infection. The expected result is Negative. Fact  Sheet for Patients:  StrictlyIdeas.no Fact Sheet for Healthcare  Providers: BankingDealers.co.za This test is not yet approved or cleared by the [place] FDA and has been authorized for detection and/or diagnosis of SARS-CoV-2 by FDA under an Emergency Use Authorization (EUA).  This EUA will remain in effect (meaning this test can be used) for the duration of the COVID-19 declaration under Section 564(b)(1) of the Act, 21 U.S.C. section 360bbb-3(b)(1), unless the authorization is terminated or revoked sooner. Performed at [facility], [address] [place], [place] [address]   Culture, blood (single) w Reflex to ID Panel     Status: None   Collection Time: 09/08/18  9:46 PM   Specimen: BLOOD  Result Value Ref Range   Specimen Description BLOOD LEFT ASSIST CONTROL    Special Requests      BOTTLES DRAWN AEROBIC AND ANAEROBIC Blood Culture adequate volume   Culture      NO GROWTH 5 DAYS Performed at [facility], [address] [place], [place] [address]    Report Status 09/13/2018 FINAL   Urinalysis, Complete w Microscopic     Status: Abnormal   Collection Time: 09/08/18 10:15 PM  Result Value Ref Range   Color, Urine YELLOW (A) YELLOW   APPearance CLEAR (A) CLEAR   Specific Gravity, Urine 1.017 1.005 - 1.030   pH 6.0 5.0 - 8.0   Glucose, UA NEGATIVE NEGATIVE mg/dL   Hgb urine dipstick NEGATIVE NEGATIVE   Bilirubin Urine NEGATIVE NEGATIVE   Ketones, ur NEGATIVE NEGATIVE mg/dL   Protein, ur NEGATIVE NEGATIVE mg/dL   Nitrite NEGATIVE NEGATIVE   Leukocytes,Ua NEGATIVE NEGATIVE   RBC / HPF 0-5 0 - 5 RBC/hpf   WBC, UA 0-5 0 - 5 WBC/hpf   Bacteria, UA NONE SEEN NONE SEEN   Squamous Epithelial / LPF NONE SEEN 0 - 5   Mucus PRESENT     Comment: Performed at [facility], [address] [place], [place] [address]  CBC     Status: Abnormal   Collection Time: 09/09/18  4:38 AM  Result Value Ref Range   WBC 18.1 (H) 4.0 - 10.5 K/uL   RBC 4.56 4.22 - 5.81 MIL/uL   Hemoglobin 13.4 13.0 -  17.0 g/dL   HCT 39.4 39.0 - 52.0 %   MCV 86.4 80.0 - 100.0 fL   MCH 29.4 26.0 - 34.0 pg   MCHC 34.0 30.0 - 36.0 g/dL   RDW 14.2 11.5 - 15.5 %   Platelets 193 150 - 400 K/uL   nRBC 0.0 0.0 - 0.2 %    Comment: Performed at [facility], [address] [place], [place] [id number]  Basic metabolic panel     Status: Abnormal   Collection Time: 09/09/18  6:20 AM  Result Value Ref Range   Sodium 137 135 - 145 mmol/L   Potassium 3.2 (L) 3.5 - 5.1 mmol/L   Chloride 99 98 - 111 mmol/L   CO2 27 22 - 32 mmol/L   Glucose, Bld 101 (H) 70 - 99 mg/dL   BUN 22 8 - 23 mg/dL   Creatinine, Ser 1.01 0.61 - 1.24 mg/dL   Calcium 8.4 (L) 8.9 - 10.3 mg/dL   GFR calc non Af Amer >60 >60 mL/min   GFR calc Af Amer >60 >60 mL/min   Anion gap 11 5 - 15    Comment: Performed at [facility], [address], [place], [place] [address]  Hepatic function panel  Status: Abnormal   Collection Time: 09/09/18  6:27 AM  Result Value Ref Range   Total Protein 6.4 (L) 6.5 - 8.1 g/dL   Albumin 3.1 (L) 3.5 - 5.0 g/dL   AST 25 15 - 41 U/L   ALT 17 0 - 44 U/L   Alkaline Phosphatase 51 38 - 126 U/L   Total Bilirubin 2.0 (H) 0.3 - 1.2 mg/dL   Bilirubin, Direct 0.3 (H) 0.0 - 0.2 mg/dL   Indirect Bilirubin 1.7 (H) 0.3 - 0.9 mg/dL    Comment: Performed at [facility], [address] [place], [place] [address]  Phosphorus     Status: Abnormal   Collection Time: 09/10/18  3:41 AM  Result Value Ref Range   Phosphorus 2.4 (L) 2.5 - 4.6 mg/dL    Comment: Performed at [facility], [address] [place], [place] [address]  Magnesium     Status: None   Collection Time: 09/10/18  3:41 AM  Result Value Ref Range   Magnesium 1.8 1.7 - 2.4 mg/dL    Comment: Performed at [facility], [address] [place], [place] [address]  CBC     Status: Abnormal   Collection Time: 09/10/18  3:41 AM  Result Value Ref Range   WBC 15.3 (H) 4.0 - 10.5 K/uL   RBC 4.18 (L) 4.22 - 5.81  MIL/uL   Hemoglobin 12.0 (L) 13.0 - 17.0 g/dL   HCT 36.1 (L) 39.0 - 52.0 %   MCV 86.4 80.0 - 100.0 fL   MCH 28.7 26.0 - 34.0 pg   MCHC 33.2 30.0 - 36.0 g/dL   RDW 14.4 11.5 - 15.5 %   Platelets 161 150 - 400 K/uL   nRBC 0.0 0.0 - 0.2 %    Comment: Performed at [facility], [address] [place], [place] [id number]  Basic metabolic panel     Status: Abnormal   Collection Time: 09/10/18  3:41 AM  Result Value Ref Range   Sodium 135 135 - 145 mmol/L   Potassium 3.3 (L) 3.5 - 5.1 mmol/L   Chloride 102 98 - 111 mmol/L   CO2 24 22 - 32 mmol/L   Glucose, Bld 115 (H) 70 - 99 mg/dL   BUN 25 (H) 8 - 23 mg/dL   Creatinine, Ser 0.97 0.61 - 1.24 mg/dL   Calcium 8.0 (L) 8.9 - 10.3 mg/dL   GFR calc non Af Amer >60 >60 mL/min   GFR calc Af Amer >60 >60 mL/min   Anion gap 9 5 - 15    Comment: Performed at [facility], [address] [place], [place] [address]  Hepatic function panel     Status: Abnormal   Collection Time: 09/10/18  3:41 AM  Result Value Ref Range   Total Protein 5.9 (L) 6.5 - 8.1 g/dL   Albumin 2.7 (L) 3.5 - 5.0 g/dL   AST 459 (H) 15 - 41 U/L   ALT 40 0 - 44 U/L   Alkaline Phosphatase 146 (H) 38 - 126 U/L   Total Bilirubin 3.2 (H) 0.3 - 1.2 mg/dL   Bilirubin, Direct 2.0 (H) 0.0 - 0.2 mg/dL   Indirect Bilirubin 1.2 (H) 0.3 - 0.9 mg/dL    Comment: Performed at [name], [address], [place],  [address]  Surgical pathology     Status: None   Collection Time: 09/10/18  2:45 PM  Result Value Ref Range   SURGICAL PATHOLOGY  Surgical Pathology CASE: [id number] PATIENT: [name] Surgical Pathology Report     SPECIMEN SUBMITTED: A. Gallbladder  CLINICAL HISTORY: None provided  PRE-OPERATIVE DIAGNOSIS: Cholecystitis  POST-OPERATIVE DIAGNOSIS: Same as pre-op     DIAGNOSIS: A. GALLBLADDER, CHOLECYSTECTOMY: - ACUTE CHOLECYSTITIS WITH CHOLELITHIASIS. - NEGATIVE FOR MALIGNANCY.   GROSS DESCRIPTION: A.  Labeled: Gallbladder Received: In formalin Size of specimen: 8.0 x 3.5 x 3.0 cm Specimen integrity: Distorted and disrupted, missing the cystic duct region External surface: Extremely hemorrhagic, necrotic, and eroded Wall thickness: Ranging from 0.3-0.8 cm Mucosa: Necrotic, ulcerated Cystic duct: Not present Bile present: Not present Stones present: Multiple black small stones ranging from 0.1 to 0.5 cm in greatest dimension Other findings: Not identified  Block summary: 1 -representative sections   Final Diagnosis performed by Betsy Pries, MD.   Electronically  signed 09/15/2018 10:39:53AM The electronic signature indicates that the named Attending Pathologist has evaluated the specimen  Technical component performed at [facility], [address] [place], [place] [address] Lab: [phone] Dir: Rush Farmer, MD, MMM  Professional component performed at [facility], [address], [place], [place] [address] Lab: [phone] Dir: Dellia Nims. Rubinas, MD   Phosphorus     Status: Abnormal   Collection Time: 09/11/18  9:47 AM  Result Value Ref Range   Phosphorus 2.2 (L) 2.5 - 4.6 mg/dL    Comment: Performed at [facility], [address] [place], [place] [address]  CBC     Status: Abnormal   Collection Time: 09/11/18  9:47 AM  Result Value Ref Range   WBC 9.4 4.0 - 10.5 K/uL   RBC 4.08 (L) 4.22 - 5.81 MIL/uL   Hemoglobin 11.8 (L) 13.0 - 17.0 g/dL   HCT 36.0 (L) 39.0 - 52.0 %   MCV 88.2 80.0 - 100.0 fL   MCH 28.9 26.0 - 34.0 pg   MCHC 32.8 30.0 - 36.0 g/dL   RDW 14.6 11.5 - 15.5 %   Platelets 188 150 - 400 K/uL   nRBC 0.0 0.0 - 0.2 %    Comment: Performed at [facility], [address] [place], [place] [address]    -------------------------------------------------------------------------- A&P:  Problem List Items Addressed This Visit    BPH without obstruction/lower urinary tract symptoms - Primary   Relevant Orders    Ambulatory referral to Urology   Tricompartment osteoarthritis of left knee    Other Visit Diagnoses    S/P laparoscopic cholecystectomy       Urinary retention       Relevant Orders   Ambulatory referral to Urology   Foley catheter in place       Relevant Orders   Ambulatory referral to Urology     #S/p Lap Choleycystectomy, with cholecystitis, and secondary acute metabolic encephalopathy - RESOLVED now back to baseline mental status, no further report of infection s/p surgery, finishing oral antibiotic - Still has jp drain in place, has apt with Gen Surg 9/15/ for eval / removal  #BPH, Urinary retention, foley catheter in place - Started on Flomax in hospital Referral to [facility] for returning patient, last seen 2018, now for BPH and urinary retention requiring foley catheter placement due to retention on 09/11/18 on discharge after lap choley, he still has foley in place requesting further management  #Bilateral knee osteoarthritis, pain Defer today with virtual visit, advised since significant improvement on prior cortisone injections, will anticipate bilateral knee inj on already scheduled apt 9/14  #Lewy Body Dementia with parkinsonian features  Followed by Dr Lanelle Bal Neuro New concern with some agitation/restless insomnia Discussed few options - possible med trial on Trazodone for insomnia, advised that I would ask that we reach out to Dr Manuella Ghazi first for suggestions, given he has recently been placed on sinemet and is being treated currently  Orders Placed This Encounter  Procedures  . Ambulatory referral to Urology    Referral Priority:   Routine    Referral Type:   Consultation    Referral Reason:   Specialty Services Required    Requested Specialty:   Urology    Number of Visits Requested:   1      No orders of the defined types were placed in this encounter.   Follow-up: - Return on 9/14 for knee injections  Patient verbalizes understanding with the above  medical recommendations including the limitation of remote medical advice.  Specific follow-up and call-back criteria were given for patient to follow-up or seek medical care more urgently if needed.   - Time spent in direct consultation with patient on phone: 12 minutes  [name], [facility] 09/16/2018, 2:37 PM

## 2018-09-16 NOTE — Telephone Encounter (Signed)
Left message x2.

## 2018-09-16 NOTE — Telephone Encounter (Signed)
Last note from yesterday 9/8, patient and caregiver called asking some med for restlessness at night and difficulty sleep.    Patient is followed by Dr Manuella Ghazi at [facility] for Lewy Body Dementia problem.     They attempted to call Dr Manuella Ghazi office and they left a voicemail, have not heard call back yet.  I do not see any response in the system.  If you could also attempt to call Dr Trena Platt office and find out what they recommend - we can help if they need [place] to rx medication, but we are looking for a recommendation on what would be safe for him to take.  I would commonly use Trazodone, but would like to know if they have any concerns with this on his medication from Dr Manuella Ghazi?  [name], DO [facility] 09/16/2018, 2:56 PM

## 2018-09-16 NOTE — Patient Instructions (Addendum)
AVS info given to patient by phone. 

## 2018-09-16 NOTE — Telephone Encounter (Signed)
Will call [facility] tomorrow.

## 2018-09-17 ENCOUNTER — Encounter: Payer: Self-pay | Admitting: Family Medicine

## 2018-09-17 NOTE — Telephone Encounter (Signed)
They will find out with Dr. Manuella Ghazi and call us back.

## 2018-09-18 ENCOUNTER — Ambulatory Visit: Payer: Medicare Other | Admitting: Family Medicine

## 2018-09-20 ENCOUNTER — Other Ambulatory Visit: Payer: Self-pay | Admitting: Family Medicine

## 2018-09-20 DIAGNOSIS — M1712 Unilateral primary osteoarthritis, left knee: Secondary | ICD-10-CM

## 2018-09-20 DIAGNOSIS — G8929 Other chronic pain: Secondary | ICD-10-CM

## 2018-09-20 DIAGNOSIS — M25562 Pain in left knee: Secondary | ICD-10-CM

## 2018-09-21 ENCOUNTER — Ambulatory Visit (INDEPENDENT_AMBULATORY_CARE_PROVIDER_SITE_OTHER): Payer: Medicare Other | Admitting: Family Medicine

## 2018-09-21 ENCOUNTER — Encounter: Payer: Self-pay | Admitting: Family Medicine

## 2018-09-21 ENCOUNTER — Other Ambulatory Visit: Payer: Self-pay

## 2018-09-21 VITALS — BP 108/70 | HR 72 | Ht 72.0 in | Wt 228.0 lb

## 2018-09-21 DIAGNOSIS — F5101 Primary insomnia: Secondary | ICD-10-CM

## 2018-09-21 DIAGNOSIS — M1712 Unilateral primary osteoarthritis, left knee: Secondary | ICD-10-CM | POA: Diagnosis not present

## 2018-09-21 DIAGNOSIS — M1711 Unilateral primary osteoarthritis, right knee: Secondary | ICD-10-CM

## 2018-09-21 MED ORDER — METHYLPREDNISOLONE ACETATE 40 MG/ML IJ SUSP
80.0000 mg | Freq: Once | INTRAMUSCULAR | Status: AC
  Administered 2018-09-21: 80 mg via INTRA_ARTICULAR

## 2018-09-21 MED ORDER — TRAZODONE HCL 50 MG PO TABS: 25.0000 mg | ORAL_TABLET | Freq: Every evening | ORAL | 3 refills | Status: DC | PRN

## 2018-09-21 MED ORDER — LIDOCAINE HCL (PF) 1 % IJ SOLN
8.0000 mL | Freq: Once | INTRAMUSCULAR | Status: AC
  Administered 2018-09-21: 8 mL

## 2018-09-21 NOTE — Patient Instructions (Addendum)
Thank you for coming to the office today.  Start Trazodone 50mg  nightly for insomnia  You can stop taking the Gabapentin, or keep taking it, up to you.  Let Neurology know we ordered Trazodone for you if they contact you back  You received a bilateral Left and Right Knee Joint steroid injection today. - Lidocaine numbing medicine may ease the pain initially for a few hours until it wears off - As discussed, you may experience a "steroid flare" this evening or within 24-48 hours, anytime medicine is injected into an inflamed joint it can cause the pain to get worse temporarily - Everyone responds differently to these injections, it depends on the patient and the severity of the joint problem, it may provide anywhere from days to weeks, to months of relief. Ideal response is >6 months relief - Try to take it easy for next 1-2 days, avoid over activity and strain on joint (limit walking for knee) - Recommend the following:   - For swelling - rest, compression sleeve / ACE wrap, elevation, and ice packs as needed for first few days   - For pain in future may use heating pad or moist heat as needed   Please schedule a Follow-up Appointment to: Return in about 3 months (around 12/21/2018), or if symptoms worsen or fail to improve, for arthritis knee pain, insomnia.  If you have any other questions or concerns, please feel free to call the office or send a message through [place]. You may also schedule an earlier appointment if necessary.  Additionally, you may be receiving a survey about your experience at our office within a few days to 1 week by e-mail or mail. We value your feedback.  [name], DO [place]

## 2018-09-21 NOTE — Progress Notes (Signed)
Subjective:    Patient ID: Randy [name], male    DOB: [birth year], 84 y.o.   MRN: [id number]  Randy [name] is a 84 y.o. male presenting on 09/21/2018 for Knee Pain (Bilateral knee injections.)   HPI   Insomnia Persistent problem with difficulty sleeping. See previous conversation. He was given gabapentin 300mg  per Neurology, says it is ineffective or makes him more agitated at times. He has lewy body dementia with parkinsonian features. He has not tried trazodone before. They are interested in starting this new med.  Osteoarthritis Bilateral Knees, Bilateral Knee pain (Right > Left) - Last visit with me for same problem, both knee pain arthritis, treated with Right knee injection he had Left knee injection steroid around same time. - Interval update with dramatic improvement on steroid knee injections, ready for repeat, they lasted >6-7 months - Today patient reports pain, Right worse than Left, worse with aching pain if walk too much, otherwise if sedentary more so now, he has some stiffness and pain, difficulty with steps. Denies any new fall or injury to knees   Depression screen [facility] 2/9 09/21/2018 09/16/2018 06/17/2018  Decreased Interest 0 0 0  Down, Depressed, Hopeless 0 0 0  PHQ - 2 Score 0 0 0    Social History   Tobacco Use   Smoking status: Former Smoker    Packs/day: 1.00    Years: 12.00    Pack years: 12.00    Types: Cigarettes    Quit date: 1968    Years since quitting: 52.7   Smokeless tobacco: Former Systems developer  Substance Use Topics   Alcohol use: No   Drug use: No    Review of Systems Per HPI unless specifically indicated above     Objective:    BP 108/70    Pulse 72    Ht 6' (1.829 m)    Wt 228 lb (103.4 kg)    SpO2 96%    BMI 30.92 kg/m   Wt Readings from Last 3 Encounters:  09/21/18 228 lb (103.4 kg)  09/08/18 200 lb (90.7 kg)  06/17/18 242 lb (109.8 kg)    Physical Exam Vitals signs and nursing note reviewed.  Constitutional:      General: He is  not in acute distress.    Appearance: He is well-developed. He is not diaphoretic.     Comments: Well-appearing, comfortable, cooperative  HENT:     Head: Normocephalic and atraumatic.  Eyes:     General:        Right eye: No discharge.        Left eye: No discharge.     Conjunctiva/sclera: Conjunctivae normal.  Cardiovascular:     Rate and Rhythm: Normal rate.  Pulmonary:     Effort: Pulmonary effort is normal.  Genitourinary:    Comments: Foley catheter in place, has bag, with urine in it. Musculoskeletal:     Comments: Using walker  Bilateral knees, bulky appearance, crepitus positive, with some pain and stiffness on range of motion. Limited strength testing today.  Skin:    General: Skin is warm and dry.     Findings: No erythema or rash.  Neurological:     Mental Status: He is alert and oriented to person, place, and time.  Psychiatric:        Behavior: Behavior normal.     Comments: Well groomed, good eye contact, normal speech and thoughts    ________________________________________________________ PROCEDURE NOTE Date: 09/21/18 Right and Left Knee corticosteroid injection (  x 2 injections total) Discussed benefits and risks (including pain, bleeding, infection, steroid flare). Verbal consent given by patient. Medication:  PER KNEE = 1 cc Depo-medrol 40mg  and 4 cc Lidocaine 1% without epi Time Out taken  Landmarks identified. Area cleansed with alcohol wipes. Using 21 gauge and 1, 1/2 inch needle, Right/Left knee, joint space was injected (with above listed medication) via medial approach cold spray used for superficial anesthetic. Sterile bandage placed. Patient tolerated procedure well without bleeding or paresthesias. No complications. Each injection was performed today, one on Right first followed by left    Results for orders placed or performed during the hospital encounter of 09/08/18  SARS Coronavirus 2 [facility] order, Performed in [facility] hospital lab)  Nasopharyngeal Nasopharyngeal Swab   Specimen: Nasopharyngeal Swab  Result Value Ref Range   SARS Coronavirus 2 NEGATIVE NEGATIVE  Culture, blood (single) w Reflex to ID Panel   Specimen: BLOOD  Result Value Ref Range   Specimen Description BLOOD LEFT ASSIST CONTROL    Special Requests      BOTTLES DRAWN AEROBIC AND ANAEROBIC Blood Culture adequate volume   Culture      NO GROWTH 5 DAYS Performed at [facility], [address] [place], [place] [address]    Report Status 09/13/2018 FINAL   Comprehensive metabolic panel  Result Value Ref Range   Sodium 138 135 - 145 mmol/L   Potassium 3.8 3.5 - 5.1 mmol/L   Chloride 97 (L) 98 - 111 mmol/L   CO2 30 22 - 32 mmol/L   Glucose, Bld 117 (H) 70 - 99 mg/dL   BUN 26 (H) 8 - 23 mg/dL   Creatinine, Ser 0.99 0.61 - 1.24 mg/dL   Calcium 9.0 8.9 - 10.3 mg/dL   Total Protein 7.5 6.5 - 8.1 g/dL   Albumin 3.9 3.5 - 5.0 g/dL   AST 24 15 - 41 U/L   ALT 11 0 - 44 U/L   Alkaline Phosphatase 70 38 - 126 U/L   Total Bilirubin 1.3 (H) 0.3 - 1.2 mg/dL   GFR calc non Af Amer >60 >60 mL/min   GFR calc Af Amer >60 >60 mL/min   Anion gap 11 5 - 15  Lipase, blood  Result Value Ref Range   Lipase 30 11 - 51 U/L  Lactic acid, plasma  Result Value Ref Range   Lactic Acid, Venous 0.9 0.5 - 1.9 mmol/L  CBC with Differential  Result Value Ref Range   WBC 13.0 (H) 4.0 - 10.5 K/uL   RBC 4.83 4.22 - 5.81 MIL/uL   Hemoglobin 14.1 13.0 - 17.0 g/dL   HCT 42.0 39.0 - 52.0 %   MCV 87.0 80.0 - 100.0 fL   MCH 29.2 26.0 - 34.0 pg   MCHC 33.6 30.0 - 36.0 g/dL   RDW 13.9 11.5 - 15.5 %   Platelets 209 150 - 400 K/uL   nRBC 0.0 0.0 - 0.2 %   Neutrophils Relative % 88 %   Neutro Abs 11.3 (H) 1.7 - 7.7 K/uL   Lymphocytes Relative 6 %   Lymphs Abs 0.8 0.7 - 4.0 K/uL   Monocytes Relative 6 %   Monocytes Absolute 0.8 0.1 - 1.0 K/uL   Eosinophils Relative 0 %   Eosinophils Absolute 0.0 0.0 - 0.5 K/uL   Basophils Relative 0 %   Basophils Absolute 0.0  0.0 - 0.1 K/uL   Immature Granulocytes 0 %   Abs Immature Granulocytes 0.05 0.00 - 0.07 K/uL  Urinalysis, Complete w Microscopic  Result Value Ref Range   Color, Urine YELLOW (A) YELLOW   APPearance CLEAR (A) CLEAR   Specific Gravity, Urine 1.017 1.005 - 1.030   pH 6.0 5.0 - 8.0   Glucose, UA NEGATIVE NEGATIVE mg/dL   Hgb urine dipstick NEGATIVE NEGATIVE   Bilirubin Urine NEGATIVE NEGATIVE   Ketones, ur NEGATIVE NEGATIVE mg/dL   Protein, ur NEGATIVE NEGATIVE mg/dL   Nitrite NEGATIVE NEGATIVE   Leukocytes,Ua NEGATIVE NEGATIVE   RBC / HPF 0-5 0 - 5 RBC/hpf   WBC, UA 0-5 0 - 5 WBC/hpf   Bacteria, UA NONE SEEN NONE SEEN   Squamous Epithelial / LPF NONE SEEN 0 - 5   Mucus PRESENT   CBC  Result Value Ref Range   WBC 18.1 (H) 4.0 - 10.5 K/uL   RBC 4.56 4.22 - 5.81 MIL/uL   Hemoglobin 13.4 13.0 - 17.0 g/dL   HCT 39.4 39.0 - 52.0 %   MCV 86.4 80.0 - 100.0 fL   MCH 29.4 26.0 - 34.0 pg   MCHC 34.0 30.0 - 36.0 g/dL   RDW 14.2 11.5 - 15.5 %   Platelets 193 150 - 400 K/uL   nRBC 0.0 0.0 - 0.2 %  Basic metabolic panel  Result Value Ref Range   Sodium 137 135 - 145 mmol/L   Potassium 3.2 (L) 3.5 - 5.1 mmol/L   Chloride 99 98 - 111 mmol/L   CO2 27 22 - 32 mmol/L   Glucose, Bld 101 (H) 70 - 99 mg/dL   BUN 22 8 - 23 mg/dL   Creatinine, Ser 1.01 0.61 - 1.24 mg/dL   Calcium 8.4 (L) 8.9 - 10.3 mg/dL   GFR calc non Af Amer >60 >60 mL/min   GFR calc Af Amer >60 >60 mL/min   Anion gap 11 5 - 15  Hepatic function panel  Result Value Ref Range   Total Protein 6.4 (L) 6.5 - 8.1 g/dL   Albumin 3.1 (L) 3.5 - 5.0 g/dL   AST 25 15 - 41 U/L   ALT 17 0 - 44 U/L   Alkaline Phosphatase 51 38 - 126 U/L   Total Bilirubin 2.0 (H) 0.3 - 1.2 mg/dL   Bilirubin, Direct 0.3 (H) 0.0 - 0.2 mg/dL   Indirect Bilirubin 1.7 (H) 0.3 - 0.9 mg/dL  Phosphorus  Result Value Ref Range   Phosphorus 2.4 (L) 2.5 - 4.6 mg/dL  Magnesium  Result Value Ref Range   Magnesium 1.8 1.7 - 2.4 mg/dL  CBC  Result Value Ref  Range   WBC 15.3 (H) 4.0 - 10.5 K/uL   RBC 4.18 (L) 4.22 - 5.81 MIL/uL   Hemoglobin 12.0 (L) 13.0 - 17.0 g/dL   HCT 36.1 (L) 39.0 - 52.0 %   MCV 86.4 80.0 - 100.0 fL   MCH 28.7 26.0 - 34.0 pg   MCHC 33.2 30.0 - 36.0 g/dL   RDW 14.4 11.5 - 15.5 %   Platelets 161 150 - 400 K/uL   nRBC 0.0 0.0 - 0.2 %  Basic metabolic panel  Result Value Ref Range   Sodium 135 135 - 145 mmol/L   Potassium 3.3 (L) 3.5 - 5.1 mmol/L   Chloride 102 98 - 111 mmol/L   CO2 24 22 - 32 mmol/L   Glucose, Bld 115 (H) 70 - 99 mg/dL   BUN 25 (H) 8 - 23 mg/dL   Creatinine, Ser 0.97 0.61 - 1.24 mg/dL   Calcium 8.0 (L) 8.9 -  10.3 mg/dL   GFR calc non Af Amer >60 >60 mL/min   GFR calc Af Amer >60 >60 mL/min   Anion gap 9 5 - 15  Hepatic function panel  Result Value Ref Range   Total Protein 5.9 (L) 6.5 - 8.1 g/dL   Albumin 2.7 (L) 3.5 - 5.0 g/dL   AST 459 (H) 15 - 41 U/L   ALT 40 0 - 44 U/L   Alkaline Phosphatase 146 (H) 38 - 126 U/L   Total Bilirubin 3.2 (H) 0.3 - 1.2 mg/dL   Bilirubin, Direct 2.0 (H) 0.0 - 0.2 mg/dL   Indirect Bilirubin 1.2 (H) 0.3 - 0.9 mg/dL  Phosphorus  Result Value Ref Range   Phosphorus 2.2 (L) 2.5 - 4.6 mg/dL  CBC  Result Value Ref Range   WBC 9.4 4.0 - 10.5 K/uL   RBC 4.08 (L) 4.22 - 5.81 MIL/uL   Hemoglobin 11.8 (L) 13.0 - 17.0 g/dL   HCT 36.0 (L) 39.0 - 52.0 %   MCV 88.2 80.0 - 100.0 fL   MCH 28.9 26.0 - 34.0 pg   MCHC 32.8 30.0 - 36.0 g/dL   RDW 14.6 11.5 - 15.5 %   Platelets 188 150 - 400 K/uL   nRBC 0.0 0.0 - 0.2 %  Surgical pathology  Result Value Ref Range   SURGICAL PATHOLOGY      Surgical Pathology CASE: [id number] PATIENT: [name] Surgical Pathology Report     SPECIMEN SUBMITTED: A. Gallbladder  CLINICAL HISTORY: None provided  PRE-OPERATIVE DIAGNOSIS: Cholecystitis  POST-OPERATIVE DIAGNOSIS: Same as pre-op     DIAGNOSIS: A. GALLBLADDER, CHOLECYSTECTOMY: - ACUTE CHOLECYSTITIS WITH CHOLELITHIASIS. - NEGATIVE FOR MALIGNANCY.   GROSS  DESCRIPTION: A. Labeled: Gallbladder Received: In formalin Size of specimen: 8.0 x 3.5 x 3.0 cm Specimen integrity: Distorted and disrupted, missing the cystic duct region External surface: Extremely hemorrhagic, necrotic, and eroded Wall thickness: Ranging from 0.3-0.8 cm Mucosa: Necrotic, ulcerated Cystic duct: Not present Bile present: Not present Stones present: Multiple black small stones ranging from 0.1 to 0.5 cm in greatest dimension Other findings: Not identified  Block summary: 1 -representative sections   Final Diagnosis performed by Betsy Pries, MD.   Electronically  signed 09/15/2018 10:39:53AM The electronic signature indicates that the named Attending Pathologist has evaluated the specimen  Technical component performed at [facility], [address] [place], [place] [address] Lab: [phone] Dir: Rush Farmer, MD, MMM  Professional component performed at [facility], [facility], [place], [place], [place] [address] Lab: [phone] Dir: Dellia Nims. Rubinas, MD   Troponin I (High Sensitivity)  Result Value Ref Range   Troponin I (High Sensitivity) 10 <18 ng/L  Troponin I (High Sensitivity)  Result Value Ref Range   Troponin I (High Sensitivity) 10 <18 ng/L      Assessment & Plan:   Problem List Items Addressed This Visit    None    Visit Diagnoses    Primary insomnia    -  Primary Clinically with insomnia, seems persistent Complicated by Neurological history with Lewy Body Dementia, followed by [facility] Dr Manuella Ghazi Trial on Gabapentin per neurology up to 300mg  limited results, seemed to contribute to some symptoms of agitation at times.  Start trial of Trazodone 50mg  nightly as advised, may notify us if need dose adjustment - half tab or other dose adjust increase in future. Trial for few weeks to determine if effective. - They can notify neurology about Trazodone if need to update - Can DC  or keep gabapentin if need    Relevant  Medications   traZODone (DESYREL) 50 MG tablet   Primary osteoarthritis of right knee       Relevant Medications   lidocaine (PF) (XYLOCAINE) 1 % injection 8 mL (Completed)   methylPREDNISolone acetate (DEPO-MEDROL) injection 80 mg (Completed)   Primary osteoarthritis of left knee       Relevant Medications   lidocaine (PF) (XYLOCAINE) 1 % injection 8 mL (Completed)   methylPREDNISolone acetate (DEPO-MEDROL) injection 80 mg (Completed)       Persistent chronic BILATERAL medial Knee pain Previously significant improvement near resolution of knee pain arthritis after steroid injection early 2020, lasted >6-7 months Without known injury or trauma Known OA DJD in other joints - Able to bear weight, no mechanical locking - No prior history of knee surgery, arthroscopy Imaging - X-rays L and bilateral done 10/31/17 - with moderate OA mostly tricompartmental worst Lateral and patellofemoral  Plan: Today BILATERAL Knee steroid injection - see procedure note, tolerated well - ambulating with walker with assistance - Continue Meloxicam 15mg  daily for interval while improving Knee / Leg Continue Tylenol 500-1000mg  per dose TID PRN breakthrough RICE therapy Future if not improvingwill referOrthopedic secondary opinion - consider repeat injections in future effective q 3-6 months  Meds ordered this encounter  Medications   traZODone (DESYREL) 50 MG tablet    Sig: Take 0.5-1 tablets (25-50 mg total) by mouth at bedtime as needed for sleep.    Dispense:  30 tablet    Refill:  3   lidocaine (PF) (XYLOCAINE) 1 % injection 8 mL   methylPREDNISolone acetate (DEPO-MEDROL) injection 80 mg      Follow up plan: Return in about 3 months (around 12/21/2018), or if symptoms worsen or fail to improve, for arthritis knee pain, insomnia.   [name], [facility] 09/21/2018, 1:37 PM

## 2018-09-22 ENCOUNTER — Ambulatory Visit: Payer: Medicare Other | Admitting: Pharmacist

## 2018-09-22 ENCOUNTER — Telehealth: Payer: Self-pay

## 2018-09-22 DIAGNOSIS — M25561 Pain in right knee: Secondary | ICD-10-CM

## 2018-09-22 DIAGNOSIS — M25562 Pain in left knee: Secondary | ICD-10-CM

## 2018-09-22 DIAGNOSIS — I10 Essential (primary) hypertension: Secondary | ICD-10-CM

## 2018-09-22 DIAGNOSIS — G8929 Other chronic pain: Secondary | ICD-10-CM

## 2018-09-22 NOTE — Chronic Care Management (AMB) (Signed)
Chronic Care Management   Follow Up Note   09/22/2018 Name: Randy [name] MRN: [id number] DOB: 29-Sep-1934  Referred by: [name], DO Reason for referral : Chronic Care Management (Caregiver Phone Call)   Randy [name] is a 84 y.o. year old male who is a primary care patient of [name], DO. The CCM team was consulted for assistance with chronic disease management and care coordination needs.  Randy [name] has a past medical history including, but not limited to: hypertension, osteoarthritis, hyperlipidemia, insomnia and memory loss.  I reached out to patient's daughter/caregiver, Randy [name].   Review of patient status, including review of consultants reports, relevant laboratory and other test results, and collaboration with appropriate care team members and the patient's provider was performed as part of comprehensive patient evaluation and provision of chronic care management services.     Outpatient Encounter Medications as of 09/22/2018  Medication Sig Note  . hydrochlorothiazide (HYDRODIURIL) 12.5 MG tablet Take 1 tablet (12.5 mg total) by mouth daily.   . traZODone (DESYREL) 50 MG tablet Take 0.5-1 tablets (25-50 mg total) by mouth at bedtime as needed for sleep.   [name] aspirin EC 81 MG tablet Take 81 mg by mouth daily.   . carbidopa-levodopa (SINEMET IR) 25-100 MG tablet Take 1 tablet by mouth 3 (three) times daily.   . cholecalciferol (VITAMIN D) 1000 units tablet Take 1,000 Units by mouth daily.   . Coenzyme Q10 (CO Q 10) 100 MG CAPS Take 1 capsule by mouth daily.    . enalapril (VASOTEC) 20 MG tablet TAKE 1 TABLET BY MOUTH TWICE A DAY   . fluticasone (FLONASE) 50 MCG/ACT nasal spray Place 2 sprays into both nostrils daily.  08/06/2018: As needed  . furosemide (LASIX) 20 MG tablet Take 1 tablet (20 mg total) by mouth daily as needed for edema. For 1 week take 2 pills in morning then resume normal dose. 09/11/2016: Only as needed, rarely needs  . meloxicam  (MOBIC) 15 MG tablet TAKE 1 TABLET (15 MG TOTAL) BY MOUTH DAILY AS NEEDED (MODERATE BACK PAIN).   . Omega-3 Fatty Acids (FISH OIL) 1200 MG CAPS Take by mouth.   . tamsulosin (FLOMAX) 0.4 MG CAPS capsule Take 1 capsule (0.4 mg total) by mouth daily.   . vitamin C (ASCORBIC ACID) 500 MG tablet Take 500 mg by mouth daily.    No facility-administered encounter medications on file as of 09/22/2018.     Goals Addressed            This Visit's Progress   . Medication Management       Current Barriers:  . Cognitive Deficits  Pharmacist Clinical Goal(s):  [name] Over the next 30 days, patient/caregiver will work with CM Pharmacist to address needs related to medication management and blood pressure mangement  Interventions: . Perform chart review o Patient seen by PCP 9/9 for post-hospitalization discharge visit - Referral to Urology placed o Patient seen by PCP 9/14 - Received bilateral steroid knee injections - Started on trazodone 50 mg - 1/2-1 tablet QHS prn sleep . Follow up with daughter/caregiver regarding medication questions o Denies continuing to have patient take gabapentin due to previous agitation with use o Reports she thinks that trazodone helped a little bit, but acknowledges that it may take a few weeks to see effect . Confirms Urology appointment scheduled with [facility] for 9/21 . Reports blood pressures have been "good" when checked by home health nurse  Patient Self  Care Activities:  . Patient to take medications as directed with assistance of daughter Randy [name] to aid [name] with filling his weekly pillbox and monitoring for adherence . Attends all scheduled provider appointments . Calls pharmacy for medication refills . Calls provider office for new concerns or questions . To check blood pressure daily and keep log  Please see past updates related to this goal by clicking on the "Past Updates" button in the selected goal         Plan   The care management team will reach out to the patient again over the next 30 days.  Confirm caregiver has contact information for the care management team and has been advised to call with any health related questions or concerns.   [name], PharmD, [place] [employer] [phone]

## 2018-09-22 NOTE — Patient Instructions (Signed)
Thank you allowing the Chronic Care Management Team to be a part of your care! It was a pleasure speaking with you today!     CCM (Chronic Care Management) Team    Janci Minor RN, BSN Nurse Care Coordinator  [phone]   [name] PharmD  Clinical Pharmacist  [phone]   Eula Fried LCSW Clinical Social Worker [phone]  Visit Information  Goals Addressed            This Visit's Progress   . Medication Management       Current Barriers:  . Cognitive Deficits  Pharmacist Clinical Goal(s):  [name] Over the next 30 days, patient/caregiver will work with CM Pharmacist to address needs related to medication management and blood pressure mangement  Interventions: . Perform chart review o Patient seen by PCP 9/9 for post-hospitalization discharge visit - Referral to Urology placed o Patient seen by PCP 9/14 - Received bilateral steroid knee injections - Started on trazodone 50 mg - 1/2-1 tablet QHS prn sleep . Follow up with daughter/caregiver regarding medication questions o Denies continuing to have patient take gabapentin due to previous agitation with use o Reports she thinks that trazodone helped a little bit, but acknowledges that it may take a few weeks to see effect . Confirms Urology appointment scheduled with [place] for 9/21 . Reports blood pressures have been "good" when checked by home health nurse  Patient Self Care Activities:  . Patient to take medications as directed with assistance of daughter [name] to aid Mr. Klopfenstein with filling his weekly pillbox and monitoring for adherence . Attends all scheduled provider appointments . Calls pharmacy for medication refills . Calls provider office for new concerns or questions . To check blood pressure daily and keep log  Please see past updates related to this goal by clicking on the "Past Updates" button in the selected goal         The patient verbalized  understanding of instructions provided today and declined a print copy of patient instruction materials.   The care management team will reach out to the patient again over the next 30 days.   [name], PharmD, [place] [employer] [phone]

## 2018-09-22 NOTE — Telephone Encounter (Signed)
Spoke to the patient's wife she will pick up the Rx from Dr. Manuella Ghazi but will go with Trazodone for at least 2 weeks  And then try clonazepam if that does not work and before switching she will call us. Advised wife to repeat  The plan and make sure that patient do not to take both.

## 2018-09-22 NOTE — Telephone Encounter (Signed)
Spoke to [facility] neurology Dr. Trena Platt office since never received their reply back from last week, they had Rx gabapentin which was making him agitated so Dr. Manuella Ghazi Rx him Clonazepam 0.5 mg once at bed time today. The patient has appointment and was Rx Trazodone yesterday. Patient advised to hold off Rx from Dr. Manuella Ghazi till get reply back from Dr.K.

## 2018-09-22 NOTE — Telephone Encounter (Signed)
Acknowledged. Discussed with Debby Bud CMA.  Proceed with Trazodone for now as already rx and started.  If he still has problem with sleep/agitation at night, he can try the Clonazepam PRN from Dr Manuella Ghazi.  Follow up as planned or sooner if needed with [place] or Neurology.  [name], [facility] 09/22/2018, 6:07 PM

## 2018-09-24 ENCOUNTER — Ambulatory Visit: Payer: Self-pay | Admitting: Pharmacist

## 2018-09-24 DIAGNOSIS — F5101 Primary insomnia: Secondary | ICD-10-CM

## 2018-09-25 NOTE — Chronic Care Management (AMB) (Signed)
Chronic Care Management   Follow Up Note   09/25/2018 Name: Randy [name] MRN: [id number] DOB: November 26, 1934  Referred by: [name], DO Reason for referral : Chronic Care Management (Caregiver Phone Call)   Randy [name] is a 84 y.o. year old male who is a primary care patient of [name], DO. The CCM team was consulted for assistance with chronic disease management and care coordination needs.  Randy [name] has a past medical history including, but not limited to: hypertension, osteoarthritis, hyperlipidemia, insomnia and memory loss.  Receive voicemail from patient's daughter/caregiver, [name]. Return phone call to daughter.  Review of patient status, including review of consultants reports, relevant laboratory and other test results, and collaboration with appropriate care team members and the patient's provider was performed as part of comprehensive patient evaluation and provision of chronic care management services.     Outpatient Encounter Medications as of 09/24/2018  Medication Sig Note  . clonazePAM (KLONOPIN) 0.5 MG tablet Take 1 tablet by mouth at bedtime as needed.   [name] aspirin EC 81 MG tablet Take 81 mg by mouth daily.   . carbidopa-levodopa (SINEMET IR) 25-100 MG tablet Take 1 tablet by mouth 3 (three) times daily.   . cholecalciferol (VITAMIN D) 1000 units tablet Take 1,000 Units by mouth daily.   . Coenzyme Q10 (CO Q 10) 100 MG CAPS Take 1 capsule by mouth daily.    . enalapril (VASOTEC) 20 MG tablet TAKE 1 TABLET BY MOUTH TWICE A DAY   . fluticasone (FLONASE) 50 MCG/ACT nasal spray Place 2 sprays into both nostrils daily.  08/06/2018: As needed  . furosemide (LASIX) 20 MG tablet Take 1 tablet (20 mg total) by mouth daily as needed for edema. For 1 week take 2 pills in morning then resume normal dose. 09/11/2016: Only as needed, rarely needs  . hydrochlorothiazide (HYDRODIURIL) 12.5 MG tablet Take 1 tablet (12.5 mg total) by mouth daily.   .  meloxicam (MOBIC) 15 MG tablet TAKE 1 TABLET (15 MG TOTAL) BY MOUTH DAILY AS NEEDED (MODERATE BACK PAIN).   . Omega-3 Fatty Acids (FISH OIL) 1200 MG CAPS Take by mouth.   . tamsulosin (FLOMAX) 0.4 MG CAPS capsule Take 1 capsule (0.4 mg total) by mouth daily.   . traZODone (DESYREL) 50 MG tablet Take 0.5-1 tablets (25-50 mg total) by mouth at bedtime as needed for sleep. (Patient not taking: Reported on 09/24/2018)   . vitamin C (ASCORBIC ACID) 500 MG tablet Take 500 mg by mouth daily.    No facility-administered encounter medications on file as of 09/24/2018.     Goals Addressed            This Visit's Progress   . PharmD - Medication Management       Current Barriers:  . Cognitive Deficits  Pharmacist Clinical Goal(s):  [name] Over the next 30 days, patient/caregiver will work with CM Pharmacist to address needs related to medication management and blood pressure mangement  Interventions: . Follow up with daughter/caregiver regarding medication questions o Daughter reports that they have decided to stop giving patient trazodone due to feeling like it is not helping with his insomina and instead causing [name] increased confusion during the day o Reports interested in starting medication prescribed by Dr. Manuella Ghazi. . Perform chart review o Per telephone note in chart from Dr. Manuella Ghazi on 9/15, "Start Clonazepam 0.5 mg at night time. Patient and family should be aware of potential side effects of excessive sleepiness  during day time, worsening confusion, increased risk of fall at night time if he wakes up." . Review note from Dr. Manuella Ghazi with daughter and counsel further on potential side effects/risks of clonazepam, including drowsiness, confusion and effect on balance/fall risk o [name] verbalizes understanding and reports that she sleeps close to patient and is present anytime that he gets up overnight o Encourage daughter to call office or Neurologist if needed  Patient Self Care Activities:  .  Patient to take medications as directed with assistance of daughter [name] to aid [name] with filling his weekly pillbox and monitoring for adherence . Attends all scheduled provider appointments . Calls pharmacy for medication refills . Calls provider office for new concerns or questions . To check blood pressure daily and keep log  Please see past updates related to this goal by clicking on the "Past Updates" button in the selected goal         Plan  The care management team will reach out to the patient again over the next 30 days.   [name], PharmD, [place] [employer] [phone]

## 2018-09-25 NOTE — Patient Instructions (Signed)
Thank you allowing the Chronic Care Management Team to be a part of your care! It was a pleasure speaking with you today!     CCM (Chronic Care Management) Team    Janci Minor RN, BSN Nurse Care Coordinator  [phone]   [name] PharmD  Clinical Pharmacist  [phone]   Eula Fried LCSW Clinical Social Worker [phone]  Visit Information  Goals Addressed            This Visit's Progress   . PharmD - Medication Management       Current Barriers:  . Cognitive Deficits  Pharmacist Clinical Goal(s):  [name] Over the next 30 days, patient/caregiver will work with CM Pharmacist to address needs related to medication management and blood pressure mangement  Interventions: . Follow up with daughter/caregiver regarding medication questions o Daughter reports that they have decided to stop giving patient trazodone due to feeling like it is not helping with his insomina and instead causing Mr. Mcelyea increased confusion during the day o Reports interested in starting medication prescribed by Dr. Manuella Ghazi. . Review note from Dr. Manuella Ghazi with daughter and counsel further on potential side effects/risks of clonazepam, including drowsiness, confusion and effect on balance/fall risk o [name] verbalizes understanding and reports that she sleeps close to patient and is present anytime that he gets up overnight o Encourage daughter to call office or Neurologist if needed  Patient Self Care Activities:  . Patient to take medications as directed with assistance of daughter [name] to aid Mr. Keenan with filling his weekly pillbox and monitoring for adherence . Attends all scheduled provider appointments . Calls pharmacy for medication refills . Calls provider office for new concerns or questions . To check blood pressure daily and keep log  Please see past updates related to this goal by clicking on the "Past Updates" button in the selected goal         The patient verbalized  understanding of instructions provided today and declined a print copy of patient instruction materials.   The care management team will reach out to the patient again over the next 30 days.   [name], PharmD, [place] [employer] [phone]

## 2018-09-28 ENCOUNTER — Ambulatory Visit: Payer: Medicare Other | Admitting: Urology

## 2018-09-28 ENCOUNTER — Encounter: Payer: Self-pay | Admitting: Urology

## 2018-09-28 ENCOUNTER — Other Ambulatory Visit: Payer: Self-pay

## 2018-09-28 VITALS — BP 121/77 | HR 61 | Ht 72.0 in

## 2018-09-28 DIAGNOSIS — N138 Other obstructive and reflux uropathy: Secondary | ICD-10-CM

## 2018-09-28 DIAGNOSIS — R339 Retention of urine, unspecified: Secondary | ICD-10-CM | POA: Diagnosis not present

## 2018-09-28 DIAGNOSIS — N401 Enlarged prostate with lower urinary tract symptoms: Secondary | ICD-10-CM

## 2018-09-28 MED ORDER — TAMSULOSIN HCL 0.4 MG PO CAPS: 0.4000 mg | ORAL_CAPSULE | Freq: Every day | ORAL | 3 refills | Status: DC

## 2018-09-28 MED ORDER — CIPROFLOXACIN HCL 500 MG PO TABS
500.0000 mg | ORAL_TABLET | Freq: Once | ORAL | Status: AC
  Administered 2018-09-28: 500 mg via ORAL

## 2018-09-28 NOTE — Progress Notes (Signed)
   09/28/2018 10:14 AM   [name] [birth year] [id number]  Reason for visit: Follow up urinary retention  HPI: I saw [name] for the first time in urology clinic today for follow-up of urinary retention.  He is an 84 year old male with Lewy body dementia and recent cholecystitis requiring cholecystectomy complicated by urinary retention who was previously followed in 2018 by Dr. Pilar Jarvis for gross hematuria.  His work-up in 2018 with CT urogram and cystoscopy was benign aside from an enlarged prostate with vascularity.  The patient reports some weak stream and difficulty urinating prior to his cholecystectomy, however he had increased difficulty urinating in the hospital after surgery, and CT showed a distended bladder.  It sounds ago Foley was placed at this time, and it remains and draining clear yellow urine.  He has not taken any medications for BPH prior to this hospitalization, when Flomax was started at discharge.  I had a long conversation with the patient about possible etiologies of urinary retention in the setting of his dementia and recent surgery.  Prostate measures 78 g on recent CT.  There is some protrusion into the bladder that is most likely an intravesical lobe not bladder or prostate malignancy.  PSA is well within the normal range for his age at 2.7.  I recommended a voiding trial today with PVR this afternoon, and follow-up in 2 to 3 months for repeat PVR.  I recommended he continue Flomax.  We discussed possible options if he is unable to continue to void including addition of finasteride, repeat void trial in 1 month, or even consideration of HoLEP if he has recurrent retention despite the above measures.  [name], MD 09/28/2018  A total of 25 minutes were spent face-to-face with the patient, greater than 50% was spent in patient education, counseling, and coordination of care regarding urinary retention, CT results, and BPH.  [name], MD  Addendum: The  patient return to clinic in the afternoon with inability to void and PVR of 1200 mL.  A Foley catheter was replaced with return of clear yellow urine.  He will follow-up in 2 weeks for repeat voiding trial, and if he fails again we will re-discuss CIC versus outlet procedure options.  [name], MD 09/28/2018

## 2018-09-28 NOTE — Progress Notes (Signed)
Catheter Removal  Patient is present today for a catheter removal.  49ml of water was drained from the balloon. A 16FR foley cath was removed from the bladder no complications were noted . Patient tolerated well.  Performed by: Fonnie Jarvis, CMA  Follow up/ Additional notes: Patient to return PM bladder scan  Bladder scan PM 1200  Simple Catheter Placement  Due to urinary retention patient is present today for a foley cath placement.  Patient was cleaned and prepped in a sterile fashion with betadine and lidocaine jelly 2% was instilled into the urethra.  A 16 FR foley catheter was inserted, urine return was noted  1071ml, urine was yellow in color.  The balloon was filled with 10cc of sterile water.  A night bag was attached for drainage.   Patient was given instruction on proper catheter care.  Patient tolerated well, no complications were noted   Performed by: Fonnie Jarvis, CMA  Additional notes/ Follow up: Per Dr. Diamantina Providence patient to keep foley for 2wks and return for follow up with him possible V&T keep taking flomax

## 2018-09-28 NOTE — Patient Instructions (Signed)
Acute Urinary Retention, Male  Acute urinary retention means that you cannot pee (urinate) at all, or that you pee too little and your bladder is not emptied completely. If it is not treated, it can lead to kidney damage or other serious problems. Follow these instructions at home:  Take over-the-counter and prescription medicines only as told by your doctor. Ask your doctor what medicines you should stay away from. Do not take any medicine unless your doctor says it is okay to do so.  If you were sent home with a tube that drains the bladder (catheter), take care of it as told by your doctor.  Drink enough fluid to keep your pee clear or pale yellow.  If you were given an antibiotic, take it as told by your doctor. Do not stop taking the antibiotic even if you start to feel better.  Do not use any products that contain nicotine or tobacco, such as cigarettes and e-cigarettes. If you need help quitting, ask your doctor.  Watch for changes in your symptoms. Tell your doctor about them.  If told, track changes in your blood pressure at home. Tell your doctor about them.  Keep all follow-up visits as told by your doctor. This is important. Contact a doctor if:  You have spasms or you leak pee when you have spasms. Get help right away if:  You have chills or a fever.  You have a tube that drains the bladder and: ? The tube stops draining pee. ? The tube falls out.  You have blood in your pee. Summary  Acute urinary retention means that you have problems peeing. It may mean that you cannot pee at all, or that you pee too little.  If this condition is not treated, it can lead to kidney damage or other serious problems.  If you were sent home with a tube that drains the bladder, take care of it as told by your doctor.  Monitor any changes in your symptoms. Tell your doctor about any changes. This information is not intended to replace advice given to you by your health care  provider. Make sure you discuss any questions you have with your health care provider. Document Released: 06/12/2007 Document Revised: 03/12/2018 Document Reviewed: 01/26/2016 Elsevier Patient Education  2020 Elsevier Inc.  

## 2018-09-29 ENCOUNTER — Ambulatory Visit: Payer: Medicare Other | Admitting: Physician Assistant

## 2018-10-05 ENCOUNTER — Ambulatory Visit: Payer: Self-pay | Admitting: *Deleted

## 2018-10-05 ENCOUNTER — Telehealth: Payer: Self-pay

## 2018-10-05 ENCOUNTER — Telehealth: Payer: Self-pay | Admitting: Urology

## 2018-10-05 ENCOUNTER — Other Ambulatory Visit: Payer: Self-pay | Admitting: Family Medicine

## 2018-10-05 DIAGNOSIS — R413 Other amnesia: Secondary | ICD-10-CM

## 2018-10-05 DIAGNOSIS — R296 Repeated falls: Secondary | ICD-10-CM

## 2018-10-05 DIAGNOSIS — K810 Acute cholecystitis: Secondary | ICD-10-CM

## 2018-10-05 MED ORDER — TAMSULOSIN HCL 0.4 MG PO CAPS: 0.4000 mg | ORAL_CAPSULE | Freq: Every day | ORAL | 3 refills | Status: DC

## 2018-10-05 NOTE — Chronic Care Management (AMB) (Signed)
  Chronic Care Management   Outreach Note  10/05/2018 Name: Randy [name] MRN: [id number] DOB: 08-08-1934  Referred by: [name], DO Reason for referral : Chronic Care Management (Unsuccessful outreach)   An unsuccessful telephone outreach was attempted today. The patient was referred to the case management team by for assistance with care management and care coordination.   Follow Up Plan: A HIPPA compliant phone message was left for the patient's daughter ms. Randy [name] providing contact information and requesting a return call.  The care management team will reach out to the patient again over the next 30 days.   Merlene Morse Zyaira Vejar RN, BSN Nurse Case Pharmacist, [facility]  [phone]) [employer]

## 2018-10-05 NOTE — Telephone Encounter (Signed)
Script resent

## 2018-10-05 NOTE — Chronic Care Management (AMB) (Signed)
Chronic Care Management   Follow Up Note   10/05/2018 Name: Randy [name] MRN: [id number] DOB: 1934/05/16  Referred by: [name], DO Reason for referral : Chronic Care Management (Memory Loss )   Randy [name] is a 84 y.o. year old male who is a primary care patient of [name], DO. The CCM team was consulted for assistance with chronic disease management and care coordination needs.    Review of patient status, including review of consultants reports, relevant laboratory and other test results, and collaboration with appropriate care team members and the patient's provider was performed as part of comprehensive patient evaluation and provision of chronic care management services.    SDOH (Social Determinants of Health) screening performed today: Physical Activity. See Care Plan for related entries.   Advanced Directives Status: N See Care Plan and Vynca application for related entries.  Outpatient Encounter Medications as of 10/05/2018  Medication Sig Note  . carbidopa-levodopa (SINEMET IR) 25-100 MG tablet Take 1 tablet by mouth 3 (three) times daily.   . cholecalciferol (VITAMIN D) 1000 units tablet Take 1,000 Units by mouth daily.   . clonazePAM (KLONOPIN) 0.5 MG tablet Take 1 tablet by mouth at bedtime as needed.   . Coenzyme Q10 (CO Q 10) 100 MG CAPS Take 1 capsule by mouth daily.    . enalapril (VASOTEC) 20 MG tablet TAKE 1 TABLET BY MOUTH TWICE A DAY   . fluticasone (FLONASE) 50 MCG/ACT nasal spray Place 2 sprays into both nostrils daily.  08/06/2018: As needed  . furosemide (LASIX) 20 MG tablet Take 1 tablet (20 mg total) by mouth daily as needed for edema. For 1 week take 2 pills in morning then resume normal dose. 09/11/2016: Only as needed, rarely needs  . gabapentin (NEURONTIN) 300 MG capsule PLEASE SEE ATTACHED FOR DETAILED DIRECTIONS   . hydrochlorothiazide (HYDRODIURIL) 12.5 MG tablet Take 1 tablet (12.5 mg total) by mouth daily.   . meloxicam  (MOBIC) 15 MG tablet TAKE 1 TABLET (15 MG TOTAL) BY MOUTH DAILY AS NEEDED (MODERATE BACK PAIN).   . Omega-3 Fatty Acids (FISH OIL) 1200 MG CAPS Take by mouth.   . tamsulosin (FLOMAX) 0.4 MG CAPS capsule Take 1 capsule (0.4 mg total) by mouth daily.   . vitamin C (ASCORBIC ACID) 500 MG tablet Take 500 mg by mouth daily.    No facility-administered encounter medications on file as of 10/05/2018.      Goals Addressed            This Visit's Progress   . [name]'s memory is failing and he is falling (pt-stated)       Current Barriers:  Randy [name] Knowledge Deficits related to falls prevention . Cognitive Deficits . Memory issues  Nurse Case Manager Clinical Goal(s):  Randy [name] Over the next 90 days, patient will work with [facility]  to address needs related to falls prevention and memory improvement  Interventions:  . Provided verbal education re: Potential causes of falls and Fall prevention strategies . Assessed for falls since last encounter.-None patient compliant using walker . Assessed patients knowledge of fall risk prevention secondary to previously provided education.-Patient has HH PT/OT . Evaluation of current treatment plan related to dementia and patient's adherence to plan as established by provider. . Discussed plans with patient for ongoing care management follow up and provided patient with direct contact information for care management team . Spoke with daughter Randy [name] about how patient was doing. Patient recently had surgery to remove  gallbladder. She relayed he was improving having good days and bad. "He has a hard time even remembering how to get out of his chair at times"  . Daughter stated she felt the patient may be having the symptoms of sundowning, because he seems more agitated in the evening. Discussed talking to neurologist about this.  . Discussed with daughter [employer] and [facility] for respite assistance for dementia. [facility] also has caregiver classes  once Covid restrictions are lifted.  . Discussed if daughter felt MOW would be helpful for patient and spouse to ease the spouse's care giving burden.   Patient Self Care Activities:  . [name] (assistive device) appropriately with all ambulation . De-clutter walkways . Change positions slowly . Wear secure fitting shoes at all times with ambulation . Utilize home lighting for dim lit areas . Have self and pet awareness at all times   Patient Self Care Activities:  . Currently UNABLE TO independently prevent falls  . Attends all scheduled provider appointments . Performs ADL's independently  Please see past updates related to this goal by clicking on the "Past Updates" button in the selected goal          The care management team will reach out to the patient again over the next 30 days.  The patient has been provided with contact information for the care management team and has been advised to call with any health related questions or concerns.    Merlene Morse Kiran Carline RN, BSN Nurse Case Pharmacist, [facility]  [phone]) [employer]

## 2018-10-05 NOTE — Telephone Encounter (Signed)
Randy [name] with [facility] nurse called said that pt have some redness in groin area ( family member was putting powder on  It ) wanted to know if you would call something in  At  [place] call back # is [phone]

## 2018-10-05 NOTE — Telephone Encounter (Signed)
-----   Message from Royanne Foots, [place] sent at 10/05/2018  2:13 PM EDT ----- Regarding: RE: refill request I can resend no prob ----- Message ----- From: Benard Halsted Sent: 10/05/2018  12:13 PM EDT To: Royanne Foots, CMA Subject: refill request                                 Patient's wife called stating that he needed a refill on his tamsulosin. He was last seen on 09-28-18 and [facility] wrote the refill but sis not send it to the pharmacy it says it printed. Can we call this in for the patient?   Sharyn Lull

## 2018-10-05 NOTE — Patient Instructions (Signed)
Thank you allowing the Chronic Care Management Team to be a part of your care! It was a pleasure speaking with you today!  CCM (Chronic Care Management) Team   Brinna Divelbiss RN, BSN Nurse Care Coordinator  [phone]  [name] PharmD  Clinical Pharmacist  [phone]  Eula Fried LCSW Clinical Social Worker [phone]  Goals Addressed            This Visit's Progress   . [name]'s memory is failing and he is falling (pt-stated)       Current Barriers:  [name] Knowledge Deficits related to falls prevention . Cognitive Deficits . Memory issues  Nurse Case Manager Clinical Goal(s):  [name] Over the next 90 days, patient will work with [facility]  to address needs related to falls prevention and memory improvement  Interventions:  . Provided verbal education re: Potential causes of falls and Fall prevention strategies . Assessed for falls since last encounter.-None patient compliant using walker . Assessed patients knowledge of fall risk prevention secondary to previously provided education.-Patient has HH PT/OT . Evaluation of current treatment plan related to dementia and patient's adherence to plan as established by provider. . Discussed plans with patient for ongoing care management follow up and provided patient with direct contact information for care management team . Spoke with daughter [name] about how patient was doing. Patient recently had surgery to remove gallbladder. She relayed he was improving having good days and bad. "He has a hard time even remembering how to get out of his chair at times"  . Daughter stated she felt the patient may be having the symptoms of sundowning, because he seems more agitated in the evening. Discussed talking to neurologist about this.  . Discussed with daughter [employer] and [facility] for respite assistance for dementia. [facility] also has caregiver classes once Covid restrictions are lifted.  . Discussed if  daughter felt MOW would be helpful for patient and spouse to ease the spouse's care giving burden.   Patient Self Care Activities:  . [name] (assistive device) appropriately with all ambulation . De-clutter walkways . Change positions slowly . Wear secure fitting shoes at all times with ambulation . Utilize home lighting for dim lit areas . Have self and pet awareness at all times   Patient Self Care Activities:  . Currently UNABLE TO independently prevent falls  . Attends all scheduled provider appointments . Performs ADL's independently  Please see past updates related to this goal by clicking on the "Past Updates" button in the selected goal         The patient verbalized understanding of instructions provided today and declined a print copy of patient instruction materials.   The patient has been provided with contact information for the care management team and has been advised to call with any health related questions or concerns.

## 2018-10-05 NOTE — Telephone Encounter (Signed)
-----   Message from Benard Halsted sent at 10/05/2018 12:13 PM EDT ----- Regarding: refill request Patient's wife called stating that he needed a refill on his tamsulosin. He was last seen on 09-28-18 and Sninsky wrote the refill but sis not send it to the pharmacy it says it printed. Can we call this in for the patient?   Sharyn Lull

## 2018-10-05 NOTE — Telephone Encounter (Signed)
Randy [name] may put zinc oxide ointment on groin rash.  IF not improving in 3-5 days, may need to be seen in clinic.  Zinc oxide is available over the counter in the adult or child diapering sections of the store.

## 2018-10-05 NOTE — Telephone Encounter (Signed)
Patient's wife is aware   Randy [name]

## 2018-10-06 NOTE — Telephone Encounter (Signed)
Randy [name] from [facility] was notified. No questions or concerns.

## 2018-10-12 ENCOUNTER — Ambulatory Visit: Payer: Medicare Other | Admitting: Urology

## 2018-10-12 ENCOUNTER — Other Ambulatory Visit: Payer: Self-pay | Admitting: Radiology

## 2018-10-12 ENCOUNTER — Other Ambulatory Visit: Payer: Self-pay

## 2018-10-12 ENCOUNTER — Encounter: Payer: Self-pay | Admitting: Urology

## 2018-10-12 VITALS — BP 125/82 | HR 64 | Ht 72.0 in

## 2018-10-12 DIAGNOSIS — R339 Retention of urine, unspecified: Secondary | ICD-10-CM

## 2018-10-12 DIAGNOSIS — N138 Other obstructive and reflux uropathy: Secondary | ICD-10-CM

## 2018-10-12 DIAGNOSIS — N401 Enlarged prostate with lower urinary tract symptoms: Secondary | ICD-10-CM

## 2018-10-12 LAB — BLADDER SCAN AMB NON-IMAGING: Scan Result: 627

## 2018-10-12 NOTE — Progress Notes (Signed)
   10/12/2018 4:12 PM   Randy [name] March 06, 1934 [id number]  Reason for visit: Follow up urinary retention  HPI: [name] is an 84 year old male with history of urinary retention who is failed multiple void trials now despite Flomax.  He failed another void trial today with a PVR of greater than 600 mL with inability to void.  He is here with his daughter today.  Prostate measured 78 g on recent CT.  PSA is normal at 2.7.  We discussed bladder management options in the setting of his multiple failed void trial despite Flomax including chronic Foley with monthly changes, clean intermittent catheterization 3-4 times daily, or an outlet procedure like HOLEP.  The patient and his daughter strongly desire an outlet procedure to attempt to get the patient without a catheter.  We discussed the low, but not 0, risk of an atonic bladder that would result in ongoing urinary retention despite an outlet procedure.  They would like to defer urodynamics for further work-up and proceed straight to outlet procedure which is very reasonable.  We discussed the risks and benefits of HOLEP at length.  The procedure requires general anesthesia and takes 2 to 3 hours, and a holmium laser is used to enucleate the prostate and push this tissue into the bladder.  A morcellator is then used to remove this tissue, which is sent for pathology.  Majority of patients are able to discharge the same day with a catheter in place for 2 to 3 days, and will follow-up in clinic for a voiding trial.  Approximately 10% of patients will be admitted overnight to monitor the urine, or if they have multiple comorbidities.  We specifically discussed the risks of bleeding, infection, retrograde ejaculation, temporary urgency and urge incontinence, very low risk of long-term incontinence, and possible need for additional procedures.  Schedule HOLEP Foley replaced today  A total of 15 minutes were spent face-to-face with the patient, greater  than 50% was spent in patient education, counseling, and coordination of care regarding BPH, urinary retention, and HOLEP.  [name], [facility] [address] [place], [place] [address] [phone]

## 2018-10-12 NOTE — Progress Notes (Signed)
Fill and Pull Catheter Removal  Patient is present today for a catheter removal.  Patient was cleaned and prepped in a sterile fashion 380ml of sterile water/ saline was instilled into the bladder when the patient felt the urge to urinate. 34ml of water was then drained from the balloon.  A 16FR foley cath was removed from the bladder no complications were noted .  Patient as then given some time to void on their own.  Patient cannot void  He will return this afternoon for a bladder scan  Performed by: Fonnie Jarvis, CMA  Follow up/ Additional notes: PM bladder scan shoes 642ml patient unable to void Per Dr. Diamantina Providence foley replaced  Simple Catheter Placement  Due to urinary retention patient is present today for a foley cath placement.  Patient was cleaned and prepped in a sterile fashion with betadine and lidocaine jelly 2% was instilled into the urethra.  A 16 FR Coude foley catheter was inserted, urine return was noted  640 ml, urine was yellow in color.  The balloon was filled with 10cc of sterile water.  A night bag was attached for drainage. Patient was also given a night bag to take home and was given instruction on how to change from one bag to another.  Patient was given instruction on proper catheter care.  Patient tolerated well, no complications were noted     Performed [id number] Qualls CMA  Additional notes/ Follow up: having surgery

## 2018-10-12 NOTE — Patient Instructions (Signed)

## 2018-10-21 ENCOUNTER — Ambulatory Visit: Payer: Self-pay | Admitting: Pharmacist

## 2018-10-21 ENCOUNTER — Telehealth: Payer: Self-pay

## 2018-10-21 DIAGNOSIS — F5101 Primary insomnia: Secondary | ICD-10-CM

## 2018-10-21 DIAGNOSIS — I10 Essential (primary) hypertension: Secondary | ICD-10-CM

## 2018-10-21 NOTE — Patient Instructions (Signed)
Thank you allowing the Chronic Care Management Team to be a part of your care! It was a pleasure speaking with you today!     CCM (Chronic Care Management) Team    Janci Minor RN, BSN Nurse Care Coordinator  [phone]   [name] PharmD  Clinical Pharmacist  [phone]   Eula Fried LCSW Clinical Social Worker [phone]  Visit Information  Goals Addressed            This Visit's Progress   . PharmD - Medication Management       Current Barriers:  . Cognitive Deficits  Pharmacist Clinical Goal(s):  [name] Over the next 30 days, patient/caregiver will work with CM Pharmacist to address needs related to medication management  Interventions: . Perform chart review . Follow up with daughter/caregiver regarding medication questions o Daughter reports patient not currently taking clonazepam. Reports having a coversation with Neurologist, Gayland Curry about trying melatonin as an alternative for fewer side effects - Unable to recall melatonin dose at this time - Reports Mr. Greenwell continues to have a more difficult time at night (aggitation) compared to during the day, but reports overall patient doing Okay - "having his good days and his bad days" - Reports having clonazepam prescription for patient to use if needed as directed by Neurology . Reports blood pressure being checked by [facility] and that readings have been "good".  Patient Self Care Activities:  . Patient to take medications as directed with assistance of daughter [name] to aid Mr. Bolon with filling his weekly pillbox and monitoring for adherence . Attends all scheduled provider appointments . Calls pharmacy for medication refills . Calls provider office for new concerns or questions  Please see past updates related to this goal by clicking on the "Past Updates" button in the selected goal         The patient verbalized understanding of instructions provided today and declined a print copy of  patient instruction materials.   The care management team will reach out to the patient again over the next 30 days.   [name], PharmD, [place] [employer] [phone]

## 2018-10-21 NOTE — Chronic Care Management (AMB) (Signed)
Chronic Care Management   Follow Up Note   10/21/2018 Name: Randy [name] MRN: [id number] DOB: [birth year]  Referred by: Randy [name] Reason for referral : Chronic Care Management (Caregiver Phone Call)   Randy [name] is a 84 y.o. year old male who is a primary care patient of Randy [name]. The CCM team was consulted for assistance with chronic disease management and care coordination needs.  Randy [name] has a past medical history including, but not limited to: hypertension, osteoarthritis, hyperlipidemia, insomnia and memory loss.  I reached out to patient's daughter/caregiver, Randy [name], by phone today.   Review of patient status, including review of consultants reports, relevant laboratory and other test results, and collaboration with appropriate care team members and the patient's provider was performed as part of comprehensive patient evaluation and provision of chronic care management services.    Outpatient Encounter Medications as of 10/21/2018  Medication Sig Note  . MELATONIN PO Take 1 tablet by mouth at bedtime.   . carbidopa-levodopa (SINEMET IR) 25-100 MG tablet Take 1 tablet by mouth 3 (three) times daily.   . cholecalciferol (VITAMIN D) 1000 units tablet Take 1,000 Units by mouth daily.   . clonazePAM (KLONOPIN) 0.5 MG tablet Take 1 tablet by mouth at bedtime as needed.   . Coenzyme Q10 (CO Q 10) 100 MG CAPS Take 1 capsule by mouth daily.    . enalapril (VASOTEC) 20 MG tablet TAKE 1 TABLET BY MOUTH TWICE A DAY   . fluticasone (FLONASE) 50 MCG/ACT nasal spray Place 2 sprays into both nostrils daily.  08/06/2018: As needed  . furosemide (LASIX) 20 MG tablet Take 1 tablet (20 mg total) by mouth daily as needed for edema. For 1 week take 2 pills in morning then resume normal dose. 09/11/2016: Only as needed, rarely needs  . gabapentin (NEURONTIN) 300 MG capsule PLEASE SEE ATTACHED FOR DETAILED DIRECTIONS   . hydrochlorothiazide (HYDRODIURIL) 12.5 MG  tablet Take 1 tablet (12.5 mg total) by mouth daily.   . meloxicam (MOBIC) 15 MG tablet TAKE 1 TABLET (15 MG TOTAL) BY MOUTH DAILY AS NEEDED (MODERATE BACK PAIN).   . Omega-3 Fatty Acids (FISH OIL) 1200 MG CAPS Take by mouth.   . tamsulosin (FLOMAX) 0.4 MG CAPS capsule Take 1 capsule (0.4 mg total) by mouth daily.   . vitamin C (ASCORBIC ACID) 500 MG tablet Take 500 mg by mouth daily.    No facility-administered encounter medications on file as of 10/21/2018.     Goals Addressed            This Visit's Progress   . PharmD - Medication Management       Current Barriers:  . Cognitive Deficits  Pharmacist Clinical Goal(s):  [name] Over the next 30 days, patient/caregiver will work with CM Pharmacist to address needs related to medication management  Interventions: . Perform chart review . Follow up with daughter/caregiver regarding medication questions o Daughter reports patient not currently taking clonazepam. Reports having a coversation with Neurologist, Randy [name] about trying melatonin as an alternative for fewer side effects - Unable to recall melatonin dose at this time - Reports Randy [name] continues to have a more difficult time at night (aggitation) compared to during the day, but reports overall patient doing Okay - "having his good days and his bad days" - Reports having clonazepam prescription for patient to use if needed as directed by Neurology . Reports blood pressure being checked by [facility] and that readings  have been "good".  Patient Self Care Activities:  . Patient to take medications as directed with assistance of daughter Randy [name] to aid Randy [name] with filling his weekly pillbox and monitoring for adherence . Attends all scheduled provider appointments . Calls pharmacy for medication refills . Calls provider office for new concerns or questions  Please see past updates related to this goal by clicking on the "Past Updates" button in the selected goal         Plan   The care management team will reach out to the patient again over the next 30 days.  CM Pharmacist will reach out to the patient again over the next 60 days.  The patient has been provided with contact information for the care management team and has been advised to call with any health related questions or concerns.   Randy [name], PharmD, [place] [employer] [phone]

## 2018-10-22 ENCOUNTER — Telehealth: Payer: Self-pay

## 2018-10-27 ENCOUNTER — Ambulatory Visit: Payer: Medicare Other

## 2018-10-27 ENCOUNTER — Other Ambulatory Visit: Payer: Self-pay

## 2018-10-27 DIAGNOSIS — N138 Other obstructive and reflux uropathy: Secondary | ICD-10-CM

## 2018-10-27 DIAGNOSIS — N401 Enlarged prostate with lower urinary tract symptoms: Secondary | ICD-10-CM

## 2018-10-27 NOTE — Progress Notes (Signed)
Patient came in today to give a Ua sample. I plugged off the cathter  and waited about 15 minutes and got a sample. Send to lab

## 2018-10-28 LAB — MICROSCOPIC EXAMINATION

## 2018-10-28 LAB — URINALYSIS, COMPLETE
Bilirubin, UA: NEGATIVE
Glucose, UA: NEGATIVE
Ketones, UA: NEGATIVE
Nitrite, UA: NEGATIVE
Specific Gravity, UA: 1.025 (ref 1.005–1.030)
Urobilinogen, Ur: 1 mg/dL (ref 0.2–1.0)
pH, UA: 6 (ref 5.0–7.5)

## 2018-10-30 ENCOUNTER — Other Ambulatory Visit: Payer: Self-pay

## 2018-10-30 ENCOUNTER — Encounter
Admission: RE | Admit: 2018-10-30 | Discharge: 2018-10-30 | Disposition: A | Discharge status: Home / Self Care | Payer: Medicare Other | Source: Ambulatory Visit | Attending: Urology | Admitting: Urology

## 2018-10-30 DIAGNOSIS — Z01812 Encounter for preprocedural laboratory examination: Secondary | ICD-10-CM | POA: Diagnosis present

## 2018-10-30 HISTORY — DX: Unspecified osteoarthritis, unspecified site: M19.90

## 2018-10-30 HISTORY — DX: Benign prostatic hyperplasia without lower urinary tract symptoms: N40.0

## 2018-10-30 LAB — CULTURE, URINE COMPREHENSIVE

## 2018-10-30 NOTE — Patient Instructions (Signed)
Your procedure is scheduled on: 11/06/2018 Fri Report to Same Day Surgery 2nd floor [facility] En[facility]-take elevator on left to 2nd floor.  Check in with surgery information desk.) To find out your arrival time please call [phone] between 1PM - 3PM on 11/05/2018 Thurs  Remember: Instructions that are not followed completely may result in serious medical risk, up to and including death, or upon the discretion of your surgeon and anesthesiologist your surgery may need to be rescheduled.    _x___ 1. Do not eat food after midnight the night before your procedure. You may drink clear liquids up to 2 hours before you are scheduled to arrive at the hospital for your procedure.  Do not drink clear liquids within 2 hours of your scheduled arrival to the hospital.  Clear liquids include  --Water or Apple juice without pulp  --Clear carbohydrate beverage such as ClearFast or Gatorade  --Black Coffee or Clear Tea (No milk, no creamers, do not add anything to                  the coffee or Tea Type 1 and type 2 diabetics should only drink water.   ____Ensure clear carbohydrate drink on the way to the hospital for bariatric patients  ____Ensure clear carbohydrate drink 3 hours before surgery.   No gum chewing or hard candies.     __x__ 2. No Alcohol for 24 hours before or after surgery.   __x__3. No Smoking or e-cigarettes for 24 prior to surgery.  Do not use any chewable tobacco products for at least 6 hour prior to surgery   ____  4. Bring all medications with you on the day of surgery if instructed.    __x__ 5. Notify your doctor if there is any change in your medical condition     (cold, fever, infections).    x___6. On the morning of surgery brush your teeth with toothpaste and water.  You may rinse your mouth with mouth wash if you wish.  Do not swallow any toothpaste or mouthwash.   Do not wear jewelry, make-up, hairpins, clips or nail polish.  Do not wear lotions,  powders, or perfumes. You may wear deodorant.  Do not shave 48 hours prior to surgery. Men may shave face and neck.  Do not bring valuables to the hospital.    [facility] is not responsible for any belongings or valuables.               Contacts, dentures or bridgework may not be worn into surgery.  Leave your suitcase in the car. After surgery it may be brought to your room.  For patients admitted to the hospital, discharge time is determined by your                       treatment team.  _  Patients discharged the day of surgery will not be allowed to drive home.  You will need someone to drive you home and stay with you the night of your procedure.    Please read over the following fact sheets that you were given:   [facility] Preparing for Surgery and or MRSA Information   _x___ Take anti-hypertensive listed below, cardiac, seizure, asthma,     anti-reflux and psychiatric medicines. These include:  1. carbidopa-levodopa (SINEMET IR) 25-100 MG tablet  2.fluticasone (FLONASE) 50 MCG/ACT nasal spray if needed  3.tamsulosin (FLOMAX) 0.4 MG CAPS capsule  4.  5.  6.  ____Fleets enema or Magnesium Citrate as directed.   _x___ Use CHG Soap or sage wipes as directed on instruction sheet   ____ Use inhalers on the day of surgery and bring to hospital day of surgery  ____ Stop Metformin and Janumet 2 days prior to surgery.    ____ Take 1/2 of usual insulin dose the night before surgery and none on the morning     surgery.   _x___ Follow recommendations from Cardiologist, Pulmonologist or PCP regarding          stopping Aspirin, Coumadin, Plavix ,Eliquis, Effient, or Pradaxa, and Pletal.  X____Stop Anti-inflammatories such as Advil, Aleve, Ibuprofen, Motrin, Naproxen, Naprosyn, Goodies powders or aspirin products. OK to take Tylenol                      Celebrex.  Stop Meloxicam today   _x___ Stop supplements until after surgery.  But may continue Vitamin D, Vitamin B,       and  multivitamin. Stop co Q10 today, fish oil today.   ____ Bring C-Pap to the hospital.

## 2018-11-02 ENCOUNTER — Ambulatory Visit: Payer: Self-pay | Admitting: *Deleted

## 2018-11-02 DIAGNOSIS — R296 Repeated falls: Secondary | ICD-10-CM

## 2018-11-02 DIAGNOSIS — R413 Other amnesia: Secondary | ICD-10-CM

## 2018-11-02 DIAGNOSIS — R339 Retention of urine, unspecified: Secondary | ICD-10-CM

## 2018-11-02 DIAGNOSIS — F5101 Primary insomnia: Secondary | ICD-10-CM

## 2018-11-02 NOTE — Chronic Care Management (AMB) (Signed)
Chronic Care Management   Follow Up Note   11/02/2018 Name: Randy [name] MRN: [id number] DOB: 05-May-1934  Referred by: [name], DO Reason for referral : Chronic Care Management (Urinary retention )   Randy [name] is a 84 y.o. year old male who is a primary care patient of [name], DO. The CCM team was consulted for assistance with chronic disease management and care coordination needs.    Review of patient status, including review of consultants reports, relevant laboratory and other test results, and collaboration with appropriate care team members and the patient's provider was performed as part of comprehensive patient evaluation and provision of chronic care management services.    SDOH (Social Determinants of Health) screening performed today: Physical Activity. See Care Plan for related entries.   Advanced Directives Status: N See Care Plan and Vynca application for related entries.  Outpatient Encounter Medications as of 11/02/2018  Medication Sig  . carbidopa-levodopa (SINEMET IR) 25-100 MG tablet Take 1 tablet by mouth 3 (three) times daily.  . cholecalciferol (VITAMIN D) 1000 units tablet Take 1,000 Units by mouth daily.  . Coenzyme Q10 (CO Q 10) 100 MG CAPS Take 1 capsule by mouth daily.   . enalapril (VASOTEC) 20 MG tablet TAKE 1 TABLET BY MOUTH TWICE A DAY  . fluticasone (FLONASE) 50 MCG/ACT nasal spray Place 2 sprays into both nostrils daily as needed for allergies.   . furosemide (LASIX) 20 MG tablet Take 1 tablet (20 mg total) by mouth daily as needed for edema. For 1 week take 2 pills in morning then resume normal dose. (Patient taking differently: Take 20 mg by mouth daily as needed for fluid or edema. )  . hydrochlorothiazide (HYDRODIURIL) 12.5 MG tablet Take 1 tablet (12.5 mg total) by mouth daily.  [name] MELATONIN PO Take 1 tablet by mouth at bedtime.  . meloxicam (MOBIC) 15 MG tablet TAKE 1 TABLET (15 MG TOTAL) BY MOUTH DAILY AS NEEDED  (MODERATE BACK PAIN).  . Omega-3 Fatty Acids (FISH OIL) 1200 MG CAPS Take 1,200 mg by mouth 4 (four) times daily.   [name] omeprazole (PRILOSEC) 20 MG capsule Take 20 mg by mouth daily.  . tamsulosin (FLOMAX) 0.4 MG CAPS capsule Take 1 capsule (0.4 mg total) by mouth daily.  . vitamin C (ASCORBIC ACID) 500 MG tablet Take 500 mg by mouth daily.   No facility-administered encounter medications on file as of 11/02/2018.      Goals Addressed            This Visit's Progress   . [name]'s memory is failing and he is falling (pt-stated)       Current Barriers:  [name] Knowledge Deficits related to falls prevention . Cognitive Deficits . Memory issues  Nurse Case Manager Clinical Goal(s):  [name] Over the next 90 days, patient will work with [facility]  to address needs related to falls prevention and memory improvement  Interventions:  . Provided verbal education re: Potential causes of falls and Fall prevention strategies . Assessed for falls since last encounter.-None patient compliant using walker . Assessed patients knowledge of fall risk prevention secondary to previously provided education.-Patient has [place] PT/OT-Last visit for PT this week, Nursing continues for foley care. . Evaluation of current treatment plan related to dementia and patient's adherence to plan as established by provider. . Discussed plans with patient for ongoing care management follow up and provided patient with direct contact information for care management team . Spoke with daughter [name]  about how patient was doing. Patient is scheduled for surgery to assist with voiding easier. Patient has had a foley catheter for a while and has failed many voiding trials. Family hoping this will resolve the issue. . Daughter questioning how much melatonin is the max dose.  [name] with PCP and CCM Pharm D regarding melatonin dose, both advise 10mg  per night is max dose.  . Daughter continues to express patient has symptom of sundowners  and sometimes is up lots of times during the night. Daughter stated they are not using the clonazepam prn related to side effects. . Discussed with daughter to speak with Neurologist at appt 11/23 of other options of medications like Mirtazipine to assist with sleep and sundowners symptoms.  . Daughter stated patient is making his own breakfast. Again discussed MOW, daughter is unsure about adding them at this time, plan to discuss with her parents.  Jeralene Huff out to [facility] resource contact to inquire about [facility]. Awaiting response.    Patient Self Care Activities:  . [name] (assistive device) appropriately with all ambulation . De-clutter walkways . Change positions slowly . Wear secure fitting shoes at all times with ambulation . Utilize home lighting for dim lit areas . Have self and pet awareness at all times   Patient Self Care Activities:  . Currently UNABLE TO independently prevent falls  . Attends all scheduled provider appointments . Performs ADL's independently  Please see past updates related to this goal by clicking on the "Past Updates" button in the selected goal          The care management team will reach out to the patient again over the next 60 days.  The patient has been provided with contact information for the care management team and has been advised to call with any health related questions or concerns.    Merlene Morse Jari Carollo RN, BSN Nurse Case Pharmacist, [facility] Care Management  [phone]) Business Mobile

## 2018-11-02 NOTE — Patient Instructions (Signed)
Thank you allowing the Chronic Care Management Team to be a part of your care! It was a pleasure speaking with you today!   CCM (Chronic Care Management) Team   Royden Bulman RN, BSN Nurse Care Coordinator  [phone]  [name] PharmD  Clinical Pharmacist  [phone]  Eula Fried LCSW Clinical Social Worker [phone]  Goals Addressed            This Visit's Progress   . [name]'s memory is failing and he is falling (pt-stated)       Current Barriers:  [name] Knowledge Deficits related to falls prevention . Cognitive Deficits . Memory issues  Nurse Case Manager Clinical Goal(s):  [name] Over the next 90 days, patient will work with [facility]  to address needs related to falls prevention and memory improvement  Interventions:  . Provided verbal education re: Potential causes of falls and Fall prevention strategies . Assessed for falls since last encounter.-None patient compliant using walker . Assessed patients knowledge of fall risk prevention secondary to previously provided education.-Patient has [place] PT/OT-Last visit for PT this week, Nursing continues for foley care. . Evaluation of current treatment plan related to dementia and patient's adherence to plan as established by provider. . Discussed plans with patient for ongoing care management follow up and provided patient with direct contact information for care management team . Spoke with daughter [name] about how patient was doing. Patient is scheduled for surgery to assist with voiding easier. Patient has had a foley catheter for a while and has failed many voiding trials. Family hoping this will resolve the issue. . Daughter questioning how much melatonin is the max dose.  [name] with PCP and CCM Pharm D regarding melatonin dose, both advise 10mg  per night is max dose.  . Daughter continues to express patient has symptom of sundowners and sometimes is up lots of times during the night. Daughter stated they  are not using the clonazepam prn related to side effects. . Discussed with daughter to speak with Neurologist at appt 11/23 of other options of medications like Mirtazipine to assist with sleep and sundowners symptoms.  . Daughter stated patient is making his own breakfast. Again discussed MOW, daughter is unsure about adding them at this time, plan to discuss with her parents.    Patient Self Care Activities:  . [name] (assistive device) appropriately with all ambulation . De-clutter walkways . Change positions slowly . Wear secure fitting shoes at all times with ambulation . Utilize home lighting for dim lit areas . Have self and pet awareness at all times   Patient Self Care Activities:  . Currently UNABLE TO independently prevent falls  . Attends all scheduled provider appointments . Performs ADL's independently  Please see past updates related to this goal by clicking on the "Past Updates" button in the selected goal         The patient verbalized understanding of instructions provided today and declined a print copy of patient instruction materials.   The patient has been provided with contact information for the care management team and has been advised to call with any health related questions or concerns.

## 2018-11-03 ENCOUNTER — Other Ambulatory Visit
Admission: RE | Admit: 2018-11-03 | Discharge: 2018-11-03 | Disposition: A | Discharge status: Home / Self Care | Payer: Medicare Other | Source: Ambulatory Visit | Attending: Urology | Admitting: Urology

## 2018-11-03 ENCOUNTER — Other Ambulatory Visit: Payer: Self-pay

## 2018-11-03 DIAGNOSIS — Z01812 Encounter for preprocedural laboratory examination: Secondary | ICD-10-CM | POA: Insufficient documentation

## 2018-11-03 DIAGNOSIS — Z20828 Contact with and (suspected) exposure to other viral communicable diseases: Secondary | ICD-10-CM | POA: Diagnosis not present

## 2018-11-03 LAB — SARS CORONAVIRUS 2 (TAT 6-24 HRS): SARS Coronavirus 2: NEGATIVE

## 2018-11-06 ENCOUNTER — Ambulatory Visit: Payer: Medicare Other | Admitting: Anesthesiology

## 2018-11-06 ENCOUNTER — Encounter
Admission: RE | Disposition: A | Discharge status: Home / Self Care | Payer: Self-pay | Source: Home / Self Care | Attending: Urology

## 2018-11-06 ENCOUNTER — Observation Stay
Admission: RE | Admit: 2018-11-06 | Discharge: 2018-11-07 | Disposition: A | Discharge status: Home / Self Care | Payer: Medicare Other | Attending: Urology | Admitting: Urology

## 2018-11-06 ENCOUNTER — Encounter: Payer: Self-pay | Admitting: *Deleted

## 2018-11-06 ENCOUNTER — Other Ambulatory Visit: Payer: Self-pay

## 2018-11-06 DIAGNOSIS — I10 Essential (primary) hypertension: Secondary | ICD-10-CM | POA: Insufficient documentation

## 2018-11-06 DIAGNOSIS — K219 Gastro-esophageal reflux disease without esophagitis: Secondary | ICD-10-CM | POA: Insufficient documentation

## 2018-11-06 DIAGNOSIS — M810 Age-related osteoporosis without current pathological fracture: Secondary | ICD-10-CM | POA: Insufficient documentation

## 2018-11-06 DIAGNOSIS — N210 Calculus in bladder: Secondary | ICD-10-CM | POA: Insufficient documentation

## 2018-11-06 DIAGNOSIS — Z87891 Personal history of nicotine dependence: Secondary | ICD-10-CM | POA: Diagnosis not present

## 2018-11-06 DIAGNOSIS — N401 Enlarged prostate with lower urinary tract symptoms: Secondary | ICD-10-CM | POA: Diagnosis present

## 2018-11-06 DIAGNOSIS — R338 Other retention of urine: Secondary | ICD-10-CM | POA: Insufficient documentation

## 2018-11-06 DIAGNOSIS — M5136 Other intervertebral disc degeneration, lumbar region: Secondary | ICD-10-CM | POA: Insufficient documentation

## 2018-11-06 DIAGNOSIS — Z23 Encounter for immunization: Secondary | ICD-10-CM | POA: Insufficient documentation

## 2018-11-06 DIAGNOSIS — C61 Malignant neoplasm of prostate: Secondary | ICD-10-CM | POA: Diagnosis not present

## 2018-11-06 DIAGNOSIS — Z20828 Contact with and (suspected) exposure to other viral communicable diseases: Secondary | ICD-10-CM | POA: Diagnosis not present

## 2018-11-06 DIAGNOSIS — G3183 Dementia with Lewy bodies: Secondary | ICD-10-CM | POA: Insufficient documentation

## 2018-11-06 DIAGNOSIS — Z7951 Long term (current) use of inhaled steroids: Secondary | ICD-10-CM | POA: Diagnosis not present

## 2018-11-06 DIAGNOSIS — N138 Other obstructive and reflux uropathy: Secondary | ICD-10-CM | POA: Diagnosis present

## 2018-11-06 DIAGNOSIS — N320 Bladder-neck obstruction: Secondary | ICD-10-CM

## 2018-11-06 DIAGNOSIS — M1712 Unilateral primary osteoarthritis, left knee: Secondary | ICD-10-CM | POA: Insufficient documentation

## 2018-11-06 DIAGNOSIS — E785 Hyperlipidemia, unspecified: Secondary | ICD-10-CM | POA: Diagnosis not present

## 2018-11-06 DIAGNOSIS — Z79899 Other long term (current) drug therapy: Secondary | ICD-10-CM | POA: Diagnosis not present

## 2018-11-06 DIAGNOSIS — R339 Retention of urine, unspecified: Secondary | ICD-10-CM

## 2018-11-06 HISTORY — PX: HOLEP-LASER ENUCLEATION OF THE PROSTATE WITH MORCELLATION: SHX6641

## 2018-11-06 LAB — PROTIME-INR
INR: 1.2 (ref 0.8–1.2)
Prothrombin Time: 14.6 seconds (ref 11.4–15.2)

## 2018-11-06 SURGERY — ENUCLEATION, PROSTATE, USING LASER, WITH MORCELLATION
Anesthesia: General | Site: Prostate

## 2018-11-06 MED ORDER — HYDROMORPHONE HCL 1 MG/ML IJ SOLN: 0.5000 mg | INTRAMUSCULAR | Status: DC | PRN

## 2018-11-06 MED ORDER — EPHEDRINE SULFATE 50 MG/ML IJ SOLN
INTRAMUSCULAR | Status: DC | PRN
  Administered 2018-11-06 (×2): 10 mg via INTRAVENOUS

## 2018-11-06 MED ORDER — SODIUM CHLORIDE 0.9% FLUSH: 3.0000 mL | INTRAVENOUS | Status: DC | PRN

## 2018-11-06 MED ORDER — HYDROCODONE-ACETAMINOPHEN 5-325 MG PO TABS: 1.0000 | ORAL_TABLET | ORAL | Status: DC | PRN

## 2018-11-06 MED ORDER — ACETAMINOPHEN 325 MG PO TABS: 325.0000 mg | ORAL_TABLET | ORAL | Status: DC | PRN

## 2018-11-06 MED ORDER — FUROSEMIDE 20 MG PO TABS: 20.0000 mg | ORAL_TABLET | Freq: Every day | ORAL | Status: DC | PRN

## 2018-11-06 MED ORDER — ROCURONIUM BROMIDE 50 MG/5ML IV SOLN
INTRAVENOUS | Status: AC
  Filled 2018-11-06: qty 1

## 2018-11-06 MED ORDER — SODIUM CHLORIDE 0.9% FLUSH
3.0000 mL | Freq: Two times a day (BID) | INTRAVENOUS | Status: DC
  Administered 2018-11-06: 3 mL via INTRAVENOUS

## 2018-11-06 MED ORDER — PROMETHAZINE HCL 25 MG/ML IJ SOLN
INTRAMUSCULAR | Status: AC
  Administered 2018-11-06: 6.25 mg via INTRAVENOUS
  Filled 2018-11-06: qty 1

## 2018-11-06 MED ORDER — LIDOCAINE HCL (PF) 2 % IJ SOLN
INTRAMUSCULAR | Status: AC
  Filled 2018-11-06: qty 10

## 2018-11-06 MED ORDER — FENTANYL CITRATE (PF) 100 MCG/2ML IJ SOLN
INTRAMUSCULAR | Status: AC
  Filled 2018-11-06: qty 2

## 2018-11-06 MED ORDER — INFLUENZA VAC A&B SA ADJ QUAD 0.5 ML IM PRSY
0.5000 mL | PREFILLED_SYRINGE | INTRAMUSCULAR | Status: AC
  Administered 2018-11-07: 0.5 mL via INTRAMUSCULAR
  Filled 2018-11-06: qty 0.5

## 2018-11-06 MED ORDER — DEXAMETHASONE SODIUM PHOSPHATE 10 MG/ML IJ SOLN
INTRAMUSCULAR | Status: AC
  Filled 2018-11-06: qty 1

## 2018-11-06 MED ORDER — FAMOTIDINE 20 MG PO TABS
20.0000 mg | ORAL_TABLET | Freq: Once | ORAL | Status: AC
  Administered 2018-11-06: 09:00:00 20 mg via ORAL

## 2018-11-06 MED ORDER — HYDROCODONE-ACETAMINOPHEN 5-325 MG PO TABS: 1.0000 | ORAL_TABLET | ORAL | 0 refills | Status: AC | PRN

## 2018-11-06 MED ORDER — BELLADONNA ALKALOIDS-OPIUM 16.2-60 MG RE SUPP
1.0000 | Freq: Every day | RECTAL | Status: DC
  Administered 2018-11-06: 13:00:00 1 via RECTAL

## 2018-11-06 MED ORDER — LIDOCAINE HCL (CARDIAC) PF 100 MG/5ML IV SOSY
PREFILLED_SYRINGE | INTRAVENOUS | Status: DC | PRN
  Administered 2018-11-06: 100 mg via INTRATRACHEAL

## 2018-11-06 MED ORDER — LIDOCAINE HCL 4 % MT SOLN
OROMUCOSAL | Status: DC | PRN
  Administered 2018-11-06: 4 mL via TOPICAL

## 2018-11-06 MED ORDER — CEFAZOLIN SODIUM-DEXTROSE 2-4 GM/100ML-% IV SOLN
INTRAVENOUS | Status: AC
  Filled 2018-11-06: qty 100

## 2018-11-06 MED ORDER — PROMETHAZINE HCL 25 MG/ML IJ SOLN
6.2500 mg | INTRAMUSCULAR | Status: DC | PRN
  Administered 2018-11-06: 13:00:00 6.25 mg via INTRAVENOUS

## 2018-11-06 MED ORDER — SODIUM CHLORIDE 0.9 % IV SOLN: 250.0000 mL | INTRAVENOUS | Status: DC | PRN

## 2018-11-06 MED ORDER — ENALAPRIL MALEATE 20 MG PO TABS
20.0000 mg | ORAL_TABLET | Freq: Two times a day (BID) | ORAL | Status: DC
  Administered 2018-11-06 – 2018-11-07 (×2): 20 mg via ORAL
  Filled 2018-11-06 (×3): qty 1

## 2018-11-06 MED ORDER — ROCURONIUM BROMIDE 100 MG/10ML IV SOLN
INTRAVENOUS | Status: DC | PRN
  Administered 2018-11-06: 20 mg via INTRAVENOUS
  Administered 2018-11-06: 50 mg via INTRAVENOUS
  Administered 2018-11-06: 10 mg via INTRAVENOUS

## 2018-11-06 MED ORDER — EPHEDRINE SULFATE 50 MG/ML IJ SOLN
INTRAMUSCULAR | Status: AC
  Filled 2018-11-06: qty 1

## 2018-11-06 MED ORDER — LACTATED RINGERS IV SOLN
INTRAVENOUS | Status: DC
  Administered 2018-11-06 (×4): via INTRAVENOUS

## 2018-11-06 MED ORDER — SUGAMMADEX SODIUM 200 MG/2ML IV SOLN
INTRAVENOUS | Status: DC | PRN
  Administered 2018-11-06: 200 mg via INTRAVENOUS

## 2018-11-06 MED ORDER — ONDANSETRON HCL 4 MG/2ML IJ SOLN: 4.0000 mg | INTRAMUSCULAR | Status: DC | PRN

## 2018-11-06 MED ORDER — HYDROCHLOROTHIAZIDE 25 MG PO TABS
12.5000 mg | ORAL_TABLET | Freq: Every day | ORAL | Status: DC
  Administered 2018-11-07: 12.5 mg via ORAL
  Filled 2018-11-06: qty 1

## 2018-11-06 MED ORDER — PROPOFOL 10 MG/ML IV BOLUS
INTRAVENOUS | Status: DC | PRN
  Administered 2018-11-06: 20 mg via INTRAVENOUS
  Administered 2018-11-06: 120 mg via INTRAVENOUS
  Administered 2018-11-06: 20 mg via INTRAVENOUS

## 2018-11-06 MED ORDER — CARBIDOPA-LEVODOPA 25-100 MG PO TABS
1.0000 | ORAL_TABLET | Freq: Three times a day (TID) | ORAL | Status: DC
  Administered 2018-11-06 – 2018-11-07 (×2): 1 via ORAL
  Filled 2018-11-06 (×5): qty 1

## 2018-11-06 MED ORDER — FAMOTIDINE 20 MG PO TABS
ORAL_TABLET | ORAL | Status: AC
  Administered 2018-11-06: 20 mg via ORAL
  Filled 2018-11-06: qty 1

## 2018-11-06 MED ORDER — PROPOFOL 10 MG/ML IV BOLUS
INTRAVENOUS | Status: AC
  Filled 2018-11-06: qty 20

## 2018-11-06 MED ORDER — BELLADONNA ALKALOIDS-OPIUM 16.2-60 MG RE SUPP: 1.0000 | Freq: Four times a day (QID) | RECTAL | Status: DC | PRN

## 2018-11-06 MED ORDER — DEXAMETHASONE SODIUM PHOSPHATE 10 MG/ML IJ SOLN
INTRAMUSCULAR | Status: DC | PRN
  Administered 2018-11-06: 10 mg via INTRAVENOUS

## 2018-11-06 MED ORDER — ACETAMINOPHEN 160 MG/5ML PO SOLN
325.0000 mg | ORAL | Status: DC | PRN
  Filled 2018-11-06: qty 20.3

## 2018-11-06 MED ORDER — SODIUM CHLORIDE 0.9 % IR SOLN
3000.0000 mL | Status: DC
  Administered 2018-11-07 (×2): 3000 mL

## 2018-11-06 MED ORDER — CEFAZOLIN SODIUM-DEXTROSE 2-4 GM/100ML-% IV SOLN
2.0000 g | INTRAVENOUS | Status: AC
  Administered 2018-11-06: 2 g via INTRAVENOUS

## 2018-11-06 MED ORDER — SUGAMMADEX SODIUM 200 MG/2ML IV SOLN
INTRAVENOUS | Status: AC
  Filled 2018-11-06: qty 2

## 2018-11-06 MED ORDER — MELATONIN 5 MG PO TABS
5.0000 mg | ORAL_TABLET | Freq: Every day | ORAL | Status: DC
  Administered 2018-11-06: 5 mg via ORAL
  Filled 2018-11-06 (×2): qty 1

## 2018-11-06 MED ORDER — PANTOPRAZOLE SODIUM 40 MG PO TBEC
40.0000 mg | DELAYED_RELEASE_TABLET | Freq: Every day | ORAL | Status: DC
  Administered 2018-11-06 – 2018-11-07 (×2): 40 mg via ORAL
  Filled 2018-11-06 (×2): qty 1

## 2018-11-06 MED ORDER — BELLADONNA ALKALOIDS-OPIUM 16.2-60 MG RE SUPP
RECTAL | Status: AC
  Filled 2018-11-06: qty 1

## 2018-11-06 MED ORDER — HYDROCODONE-ACETAMINOPHEN 7.5-325 MG PO TABS
1.0000 | ORAL_TABLET | Freq: Once | ORAL | Status: DC | PRN
  Filled 2018-11-06: qty 1

## 2018-11-06 MED ORDER — BELLADONNA ALKALOIDS-OPIUM 16.2-60 MG RE SUPP
RECTAL | Status: AC
  Administered 2018-11-06: 1 via RECTAL
  Filled 2018-11-06: qty 1

## 2018-11-06 MED ORDER — SODIUM CHLORIDE FLUSH 0.9 % IV SOLN
INTRAVENOUS | Status: AC
  Filled 2018-11-06: qty 10

## 2018-11-06 MED ORDER — BELLADONNA ALKALOIDS-OPIUM 16.2-60 MG RE SUPP: RECTAL | Status: DC | PRN

## 2018-11-06 MED ORDER — ONDANSETRON HCL 4 MG/2ML IJ SOLN
INTRAMUSCULAR | Status: DC | PRN
  Administered 2018-11-06: 4 mg via INTRAVENOUS

## 2018-11-06 MED ORDER — FENTANYL CITRATE (PF) 100 MCG/2ML IJ SOLN: 25.0000 ug | INTRAMUSCULAR | Status: DC | PRN

## 2018-11-06 MED ORDER — ACETAMINOPHEN 325 MG PO TABS: 650.0000 mg | ORAL_TABLET | ORAL | Status: DC | PRN

## 2018-11-06 MED ORDER — ONDANSETRON HCL 4 MG/2ML IJ SOLN
INTRAMUSCULAR | Status: AC
  Filled 2018-11-06: qty 2

## 2018-11-06 MED ORDER — FENTANYL CITRATE (PF) 250 MCG/5ML IJ SOLN
INTRAMUSCULAR | Status: DC | PRN
  Administered 2018-11-06 (×2): 50 ug via INTRAVENOUS
  Administered 2018-11-06: 25 ug via INTRAVENOUS
  Administered 2018-11-06 (×2): 50 ug via INTRAVENOUS
  Administered 2018-11-06: 25 ug via INTRAVENOUS
  Administered 2018-11-06: 50 ug via INTRAVENOUS

## 2018-11-06 SURGICAL SUPPLY — 34 items
ADAPTER IRRIG TUBE 2 SPIKE SOL (ADAPTER) ×5 IMPLANT
BAG URO DRAIN 4000ML (MISCELLANEOUS) ×2 IMPLANT
CATH FOLEY 3WAY 30CC 24FR (CATHETERS) ×1
CATH URETL 5X70 OPEN END (CATHETERS) ×2 IMPLANT
CATH URTH STD 24FR FL 3W 2 (CATHETERS) ×1 IMPLANT
CONTAINER COLLECT MORCELLATR (MISCELLANEOUS) ×1 IMPLANT
DRAPE UTILITY 15X26 TOWEL STRL (DRAPES) ×1 IMPLANT
ELECT BIVAP BIPO 22/24 DONUT (ELECTROSURGICAL) ×2
ELECTRD BIVAP BIPO 22/24 DONUT (ELECTROSURGICAL) IMPLANT
FILTER OVERFLOW MORCELLATOR (FILTER) ×1 IMPLANT
GLOVE BIOGEL PI IND STRL 7.5 (GLOVE) ×1 IMPLANT
GLOVE BIOGEL PI INDICATOR 7.5 (GLOVE) ×1
GOWN STRL REUS W/ TWL LRG LVL3 (GOWN DISPOSABLE) ×1 IMPLANT
GOWN STRL REUS W/ TWL XL LVL3 (GOWN DISPOSABLE) ×1 IMPLANT
GOWN STRL REUS W/TWL LRG LVL3 (GOWN DISPOSABLE) ×1
GOWN STRL REUS W/TWL XL LVL3 (GOWN DISPOSABLE) ×1
GUIDEWIRE STR DUAL SENSOR (WIRE) IMPLANT
HOLDER FOLEY CATH W/STRAP (MISCELLANEOUS) ×2 IMPLANT
KIT TURNOVER CYSTO (KITS) ×2 IMPLANT
LASER FIBER 550M SMARTSCOPE (Laser) ×2 IMPLANT
LOOP CUT BIPOLAR 24F LRG (ELECTROSURGICAL) ×1 IMPLANT
MORCELLATOR COLLECT CONTAINER (MISCELLANEOUS) ×2
MORCELLATOR OVERFLOW FILTER (FILTER) ×2
MORCELLATOR ROTATION 4.75 335 (MISCELLANEOUS) ×2 IMPLANT
PACK CYSTO AR (MISCELLANEOUS) ×2 IMPLANT
SET CYSTO W/LG BORE CLAMP LF (SET/KITS/TRAYS/PACK) ×2 IMPLANT
SET IRRIG Y TYPE TUR BLADDER L (SET/KITS/TRAYS/PACK) ×2 IMPLANT
SLEEVE PROTECTION STRL DISP (MISCELLANEOUS) ×4 IMPLANT
SOL .9 NS 3000ML IRR  AL (IV SOLUTION) ×24
SOL .9 NS 3000ML IRR UROMATIC (IV SOLUTION) ×4 IMPLANT
SURGILUBE 2OZ TUBE FLIPTOP (MISCELLANEOUS) ×2 IMPLANT
SYRINGE IRR TOOMEY STRL 70CC (SYRINGE) ×2 IMPLANT
TUBE PUMP MORCELLATOR PIRANHA (TUBING) ×2 IMPLANT
WATER STERILE IRR 1000ML POUR (IV SOLUTION) ×2 IMPLANT

## 2018-11-06 NOTE — Anesthesia Procedure Notes (Signed)
Procedure Name: Intubation Date/Time: 11/06/2018 9:55 AM Performed by: , , CRNA Pre-anesthesia Checklist: Patient identified, Emergency Drugs available, Suction available and Patient being monitored Patient Re-evaluated:Patient Re-evaluated prior to induction Oxygen Delivery Method: Circle system utilized Preoxygenation: Pre-oxygenation with 100% oxygen Induction Type: IV induction Ventilation: Mask ventilation without difficulty Laryngoscope Size: McGraph and 4 Grade View: Grade I Tube type: Oral Tube size: 8.0 mm Number of attempts: 1 Airway Equipment and Method: Stylet,  Video-laryngoscopy and LTA kit utilized Placement Confirmation: ETT inserted through vocal cords under direct vision,  positive ETCO2 and breath sounds checked- equal and bilateral Secured at: 23 cm Tube secured with: Tape Dental Injury: Teeth and Oropharynx as per pre-operative assessment        

## 2018-11-06 NOTE — OR Nursing (Signed)
Dr. Diamantina Providence in to see pt 1505, irrigated cath and started CBI.   Will admit to overnight stay.

## 2018-11-06 NOTE — H&P (Signed)
11/06/18 9:15 AM   Randy [name] 1934-01-10 [id number]   HPI: Randy [name] is a comorbid 84 year old male with Lewy body dementia and Foley dependent urinary retention.  He is failed multiple voiding trials in clinic despite addition of Flomax.  CT showed a distended bladder with a 78 g prostate and some small bladder stones.  He elected for HOLEP for bladder outlet obstruction.  He denies any fevers or chills, chest pain, shortness of breath.   PMH: Past Medical History:  Diagnosis Date  . Arthritis   . GERD (gastroesophageal reflux disease)   . Hyperlipidemia   . Hypertension   . Osteoporosis   . Prostate enlargement     Surgical History: Past Surgical History:  Procedure Laterality Date  . CHOLECYSTECTOMY N/A 09/10/2018   Procedure: LAPAROSCOPIC CHOLECYSTECTOMY;  Surgeon: Benjamine Sprague, DO;  Location: [facility] ORS;  Service: General;  Laterality: N/A;  . GANGLION CYST EXCISION Left 2013   Left upper arm  . HERNIA REPAIR N/A 2008   MIdline, ventral acquired hernia repair    Allergies:  Allergies  Allergen Reactions  . Antihistamines, Chlorpheniramine-Type Other (See Comments)    Can't urinate    Family History: Family History  Problem Relation Age of Onset  . Breast cancer Mother   . Lung cancer Father   . Heart disease Brother   . Leukemia Brother   . Prostate cancer Neg Hx   . Colon cancer Neg Hx   . Bladder Cancer Neg Hx   . Kidney cancer Neg Hx     Social History:  reports that he quit smoking about 52 years ago. His smoking use included cigarettes. He has a 12.00 pack-year smoking history. He has quit using smokeless tobacco. He reports that he does not drink alcohol or use drugs.  ROS: Please see flowsheet from today's date for complete review of systems.  Physical Exam: BP 120/82   Pulse 63   Temp (!) 96.9 F (36.1 C) (Tympanic)   Resp 17   SpO2 100%    Constitutional:  Alert and oriented, No acute distress. Cardiovascular: Regular rate and rhythm  Respiratory: Clear bilaterally GI: Abdomen is soft, nontender, nondistended, no abdominal masses Lymph: No cervical or inguinal lymphadenopathy. Skin: No rashes, bruises or suspicious lesions. Neurologic: Grossly intact, no focal deficits, moving all 4 extremities. Psychiatric: Normal mood and affect.  Laboratory Data: Urine culture 10/20 no growth  Pertinent Imaging: Prostate shows a 78 g prostate with a median lobe, small bladder stones  Assessment & Plan:   In summary, the patient is a comorbid 84 year old male with Lewy body dementia and Foley dependent urinary retention.  He continues to be very bothered by his chronic Foley catheter.  He has failed multiple voiding trials in clinic despite addition of Flomax.  We discussed the risks and benefits of HOLEP, and he elects to proceed.  We discussed the low risk, but not 0, risk of ongoing retention from an atonic bladder.  We discussed the risks and benefits of HOLEP at length.  The procedure requires general anesthesia and takes 2 to 3 hours, and a holmium laser is used to enucleate the prostate and push this tissue into the bladder.  A morcellator is then used to remove this tissue, which is sent for pathology.  Majority of patients are able to discharge the same day with a catheter in place for 2 to 3 days, and will follow-up in clinic for a voiding trial.  Approximately 10% of patients will be  admitted overnight to monitor the urine, or if they have multiple comorbidities.  We specifically discussed the risks of bleeding, infection, retrograde ejaculation, temporary urgency and urge incontinence, very low risk of long-term incontinence, and possible need for additional procedures.  HOLEP and cystolitholapaxy today  [name], [place] [address] [name], [place] [address] [phone]

## 2018-11-06 NOTE — Op Note (Signed)
Date of procedure: 11/06/18  Preoperative diagnosis:  1. BPH with obstruction and Foley dependent retention  Postoperative diagnosis:  1. Same  Procedure: 1. HoLEP  Surgeon: [name], MD  Anesthesia: General  Complications: None  Intraoperative findings:  1.  Very large prostate with massive and broad-based median lobe 2.  Severe bladder trabeculations, no bladder tumors, small bladder stones evacuated 3.  Ureteral orifices orthotopic  EBL: 100 mL  Specimens: Prostate chips  Drains: 24 French three-way, 70 cc in balloon  Indication: Randy [name] is a 84 y.o. patient with BPH with obstruction and Foley dependent urinary retention who elected for HOLEP.  After reviewing the management options for treatment, they elected to proceed with the above surgical procedure(s). We have discussed the potential benefits and risks of the procedure, side effects of the proposed treatment, the likelihood of the patient achieving the goals of the procedure, and any potential problems that might occur during the procedure or recuperation.  We specifically discussed the risks of bleeding, infection, hematuria and clot retention, need for additional procedures, possible overnight hospital stay, temporary urgency and incontinence, rare long-term incontinence, and retrograde ejaculation.  Informed consent has been obtained.   Description of procedure:  The patient was taken to the operating room and general anesthesia was induced.  The patient was placed in the dorsal lithotomy position, prepped and draped in the usual sterile fashion, and preoperative antibiotics were administered.  SCDs were placed for DVT prophylaxis.  A preoperative time-out was performed.   The 88 French continuous flow resectoscope was inserted into the urethra using the visual obturator  The prostate was very large with a massive and broad-based median lobe. The bladder was thoroughly inspected and notable for severe bladder  trabeculations, as well as some mild erythema from the catheter, there were no tumors or other mucosal lesions.  The ureteral orifices were located in orthotopic position.  The laser was set to 2 J and 50 Hz and was used to make an incision at the 5 and 7 o'clock position to the level of the capsule from the bladder neck to the verumontanum.  The median lobe was enucleated into the bladder.  The right lateral lobe was then incised circumferentially until it was disconnected from the surrounding tissue and pushed into the bladder.  At this point there was quite a bit of bleeding compared to a standard HoLEP and I had difficulty visualizing the fossa or bladder.  I elected to insert the bipolar resectoscope with the button using the visual obturator.  I spent 30 minutes trying to obtain hemostasis.  I was unable to find any significant arterial bleeders, but there was some diffuse mucosal oozing from the left lateral lobe.  I did not feel that I could enucleate the left lobe with the poor vision, and I used the resecting loop to resect some of the bleeding mucosal tissue.  Meticulous hemostasis was then achieved with the button.  Vision was significantly improved at this point.  The 90 French resectoscope was then switched out for the 46 French nephroscope and the lobes were morcellated and the tissue sent to pathology.  A 24 French three-way catheter was inserted easily with return of light red fluid, and CBI was initiated.  70 cc were placed in the balloon.  Urine was light red.  The patient tolerated the procedure well without any immediate complications and was extubated and transferred to the recovery room in stable condition.  Urine was clear to watermelon colored  on fast CBI.  A belladonna suppository was placed.  Disposition: Stable to PACU  Plan: Wean CBI in PACU, anticipate discharge home today with void trial in clinic in 2-3 days  [name], MD 11/06/2018

## 2018-11-06 NOTE — Transfer of Care (Signed)
Immediate Anesthesia Transfer of Care Note  Patient: Randy [name]  Procedure(s) Performed: HOLEP-LASER ENUCLEATION OF THE PROSTATE WITH MORCELLATION (N/A Prostate)  Patient Location: PACU  Anesthesia Type:General  Level of Consciousness: awake, alert , oriented and patient cooperative  Airway & Oxygen Therapy: Patient Spontanous Breathing and Patient connected to face mask oxygen  Post-op Assessment: Report given to RN and Post -op Vital signs reviewed and stable  Post vital signs: Reviewed and stable  Last Vitals:  Vitals Value Taken Time  BP 128/114 11/06/18 1307  Temp    Pulse 70 11/06/18 1309  Resp 18 11/06/18 1309  SpO2 100 % 11/06/18 1309  Vitals shown include unvalidated device data.  Last Pain:  Vitals:   11/06/18 0907  TempSrc: Tympanic  PainSc: 0-No pain         Complications: No apparent anesthesia complications

## 2018-11-06 NOTE — Anesthesia Preprocedure Evaluation (Signed)
Anesthesia Evaluation  Patient identified by MRN, date of birth, ID band Patient awake    Reviewed: Allergy & Precautions, NPO status , Patient's Chart, lab work & pertinent test results  Airway Mallampati: II  TM Distance: >3 FB     Dental   Pulmonary former smoker,    Pulmonary exam normal        Cardiovascular hypertension, Normal cardiovascular exam     Neuro/Psych negative neurological ROS  negative psych ROS   GI/Hepatic Neg liver ROS, GERD  ,  Endo/Other  negative endocrine ROS  Renal/GU negative Renal ROS  negative genitourinary   Musculoskeletal  (+) Arthritis , Osteoarthritis,    Abdominal Normal abdominal exam  (+)   Peds negative pediatric ROS (+)  Hematology negative hematology ROS (+)   Anesthesia Other Findings Past Medical History: No date: GERD (gastroesophageal reflux disease) No date: Hyperlipidemia No date: Hypertension No date: Osteoporosis  Reproductive/Obstetrics                             Anesthesia Physical  Anesthesia Plan  ASA: II  Anesthesia Plan: General   Post-op Pain Management:    Induction: Intravenous  PONV Risk Score and Plan: Ondansetron, Dexamethasone and Treatment may vary due to age or medical condition  Airway Management Planned: Oral ETT  Additional Equipment:   Intra-op Plan:   Post-operative Plan: Extubation in OR  Informed Consent: I have reviewed the patients History and Physical, chart, labs and discussed the procedure including the risks, benefits and alternatives for the proposed anesthesia with the patient or authorized representative who has indicated his/her understanding and acceptance.     Dental advisory given  Plan Discussed with: CRNA and Surgeon  Anesthesia Plan Comments:         Anesthesia Quick Evaluation

## 2018-11-06 NOTE — Discharge Instructions (Addendum)
AMBULATORY SURGERY  DISCHARGE INSTRUCTIONS   1) The drugs that you were given will stay in your system until tomorrow so for the next 24 hours you should not:  A) Drive an automobile B) Make any legal decisions C) Drink any alcoholic beverage   2) You may resume regular meals tomorrow.  Today it is better to start with liquids and gradually work up to solid foods.  You may eat anything you prefer, but it is better to start with liquids, then soup and crackers, and gradually work up to solid foods.   3) Please notify your doctor immediately if you have any unusual bleeding, trouble breathing, redness and pain at the surgery site, drainage, fever, or pain not relieved by medication.    4) Additional Instructions:    Prostate Surgery Discharge Instructions  Care After  Refer to this sheet in the next few weeks. These discharge instructions provide you with general information on caring for yourself after you leave the hospital. Your caregiver may also give you specific instructions. Your treatment has been planned according to the most current medical practices available, but unavoidable complications sometimes occur. If you have any problems or questions after discharge, please call your caregiver.  HOME CARE INSTRUCTIONS   Medications  You may receive medicine for pain management. As your level of discomfort decreases, adjustments in your pain medicines may be made.   Take all medicines as directed.   You may be given a medicine (antibiotic) to kill germs following surgery. Finish all medicines. Let your caregiver know if you have any side effects or problems from the medicine.   If you are on aspirin, it would be best not to restart the aspirin until the blood in the urine clears Hygiene  You can take a shower after surgery.   You should not take a bath while you still have the urethral catheter. Activity  You will be encouraged to get out of bed as much as possible  and increase your activity level as tolerated.   Spend the first week in and around your home. For 3 weeks, avoid the following:   Straining.   Running.   Strenuous work.   Walks longer than a few blocks.   Riding for extended periods.   Sexual relations.   Do not lift heavy objects (more than 20 pounds) for at least 1 month. When lifting, use your arms instead of your abdominal muscles.   You will be encouraged to walk as tolerated. Do not exert yourself. Increase your activity level slowly. Remember that it is important to keep moving after an operation of any type. This cuts down on the possibility of developing blood clots.   Your caregiver will tell you when you can resume driving and light housework. Discuss this at your first office visit after discharge. Diet  No special diet is ordered after a TURP. However, if you are on a special diet for another medical problem, it should be continued.   Normal fluid intake is usually recommended.   Avoid alcohol and caffeinated drinks for 2 weeks. They irritate the bladder. Decaffeinated drinks are okay.   Avoid spicy foods.  Bladder Function  For the first 10 days, empty the bladder whenever you feel a definite desire. Do not try to hold the urine for long periods of time.   Urinating once or twice a night even after you are healed is not uncommon.   You may see some recurrence of blood in the urine  after discharge from the hospital. This usually happens within 2 weeks after the procedure.If this occurs, force fluids again as you did in the hospital and reduce your activity.  Bowel Function  You may experience some constipation after surgery. This can be minimized by increasing fluids and fiber in your diet. Drink enough water and fluids to keep your urine clear or pale yellow.   A stool softener may be prescribed for use at home. Do not strain to move your bowels.   If you are requiring increased pain medicine, it is  important that you take stool softeners to prevent constipation. This will help to promote proper healing by reducing the need to strain to move your bowels.  Sexual Activity  Semen movement in the opposite direction and into the bladder (retrograde ejaculation) may occur. Since the semen passes into the bladder, cloudy urine can occur the first time you urinate after intercourse. Or, you may not have an ejaculation during erection. Ask your caregiver when you can resume sexual activity. Retrograde ejaculation and reduced semen discharge should not reduce one's pleasure of intercourse.    Foley Catheter Care A soft, flexible tube (Foley catheter) may have been placed in your bladder to drain urine and fluid. Follow these instructions: Taking Care of the Catheter  Keep the area where the catheter leaves your body clean.   Attach the catheter to the leg so there is no tension on the catheter.   Keep the drainage bag below the level of the bladder, but keep it OFF the floor.   Do not take long soaking baths. Your caregiver will give instructions about showering.   Wash your hands before touching ANYTHING related to the catheter or bag.   Using mild soap and warm water on a washcloth:   Clean the area closest to the catheter insertion site using a circular motion around the catheter.   Clean the catheter itself by wiping AWAY from the insertion site for several inches down the tube.   NEVER wipe upward as this could sweep bacteria up into the urethra (tube in your body that normally drains the bladder) and cause infection.   Place a small amount of sterile lubricant at the tip of the penis where the catheter is entering.  Taking Care of the Drainage Bags  Two drainage bags may be taken home: a large overnight drainage bag, and a smaller leg bag which fits underneath clothing.   It is okay to wear the overnight bag at any time, but NEVER wear the smaller leg bag at night.   Keep the  drainage bag well below the level of your bladder. This prevents backflow of urine into the bladder and allows the urine to drain freely.   Anchor the tubing to your leg to prevent pulling or tension on the catheter. Use tape or a leg strap provided by the hospital.   Empty the drainage bag when it is 1/2 to 3/4 full. Wash your hands before and after touching the bag.   Periodically check the tubing for kinks to make sure there is no pressure on the tubing which could restrict the flow of urine.  Changing the Drainage Bags  Cleanse both ends of the clean bag with alcohol before changing.   Pinch off the rubber catheter to avoid urine spillage during the disconnection.   Disconnect the dirty bag and connect the clean one.   Empty the dirty bag carefully to avoid a urine spill.   Attach the  new bag to the leg with tape or a leg strap.  Cleaning the Drainage Bags  Whenever a drainage bag is disconnected, it must be cleaned quickly so it is ready for the next use.   Wash the bag in warm, soapy water.   Rinse the bag thoroughly with warm water.   Soak the bag for 30 minutes in a solution of white vinegar and water (1 cup vinegar to 1 quart warm water).   Rinse with warm water.  SEEK MEDICAL CARE IF:   You have chills or night sweats.   You are leaking around your catheter or have problems with your catheter. It is not uncommon to have sporadic leakage around your catheter as a result of bladder spasms. If the leakage stops, there is not much need for concern. If you are uncertain, call your caregiver.   You develop side effects that you think are coming from your medicines.  SEEK IMMEDIATE MEDICAL CARE IF:   You are suddenly unable to urinate. Check to see if there are any kinks in the drainage tubing that may cause this. If you cannot find any kinks, call your caregiver immediately. This is an emergency.   You develop shortness of breath or chest pains.   Bleeding persists or  clots develop in your urine.   You have a fever.   You develop pain in your back or over your lower belly (abdomen).   You develop pain or swelling in your legs.   Any problems you are having get worse rather than better.  MAKE SURE YOU:   Understand these instructions.   Will watch your condition.   Will get help right away if you are not doing well or get worse.            Please contact your physician with any problems or Same Day Surgery at [phone], Monday through Friday 6 am to 4 pm, or [place] at [facility] number at [phone].

## 2018-11-06 NOTE — OR Nursing (Signed)
Dr. Diamantina Providence in to see patient @ 1420 and irrigated catheter.

## 2018-11-06 NOTE — Progress Notes (Signed)
Urine dark pink with CBI off for an hour postop, will will admit overnight for observation and continue CBI for bleeding risk.  The Foley was irrigated with 120 cc of normal saline and irrigated easily with no clots, urine cleared rapidly.  Slow to medium CBI was started and urine was clear to faint pink.  Continue slow CBI overnight, stop CBI at 7 AM, anticipate discharge home with Foley in place tomorrow late morning.  [name], MD 11/06/2018

## 2018-11-06 NOTE — Anesthesia Post-op Follow-up Note (Signed)
Anesthesia QCDR form completed.        

## 2018-11-07 DIAGNOSIS — C61 Malignant neoplasm of prostate: Secondary | ICD-10-CM | POA: Diagnosis not present

## 2018-11-07 MED ORDER — CHLORHEXIDINE GLUCONATE CLOTH 2 % EX PADS
6.0000 | MEDICATED_PAD | Freq: Every day | CUTANEOUS | Status: DC
  Administered 2018-11-07 (×2): 6 via TOPICAL

## 2018-11-07 NOTE — Discharge Summary (Signed)
Date of admission: 11/06/2018  Date of discharge: 11/07/2018  Admission diagnosis: BPH with urinary retention  Discharge diagnosis: Same  Secondary diagnoses:  Patient Active Problem List   Diagnosis Date Noted  . BPH with urinary obstruction 11/06/2018  . Acute cholecystitis 09/08/2018  . Myalgia due to statin 02/24/2018  . Tricompartment osteoarthritis of left knee 11/14/2017  . Generalized weakness 04/28/2017  . Chronic low back pain 03/12/2017  . Knee pain, chronic 10/01/2016  . Chronic venous insufficiency 08/30/2016  . BPH without obstruction/lower urinary tract symptoms 03/06/2016  . Osteoarthritis of multiple joints 03/06/2016  . Degenerative joint disease (DJD) of lumbar spine 03/06/2016  . Hypertension 03/05/2016  . Hyperlipidemia 03/05/2016  . Osteoporosis 03/05/2016  . GERD (gastroesophageal reflux disease) 03/05/2016  . Bilateral lower extremity edema 03/05/2016    History and Physical: For full details, please see admission history and physical. Briefly, Randy [name] is a 84 y.o. year old patient with urinary retention.  He underwent HOLEP by Dr. Sninsky 11/06/2018  Hospital Course: Patient tolerated the procedure well.  He was then transferred to the floor after an uneventful PACU stay with low flow CBI.  His hospital course was uncomplicated.  On POD#1 the CBI was discontinued at  715.  The urine was with medium blood but translucent and no clots on irrigation. he had met discharge criteria: was eating a regular diet, was up and ambulating independently,  pain was well controlled, and was ready to for discharge.   Laboratory values:  No results for input(s): WBC, HGB, HCT in the last 72 hours. No results for input(s): NA, K, CL, CO2, GLUCOSE, BUN, CREATININE, CALCIUM in the last 72 hours. Recent Labs    11/06/18 1110  INR 1.2   No results for input(s): LABURIN in the last 72 hours. Results for orders placed or performed during the hospital encounter of  11/03/18  SARS CORONAVIRUS 2 (TAT 6-24 HRS) Nasopharyngeal Nasopharyngeal Swab     Status: None   Collection Time: 11/03/18  8:47 AM   Specimen: Nasopharyngeal Swab  Result Value Ref Range Status   SARS Coronavirus 2 NEGATIVE NEGATIVE Final    Comment: (NOTE) SARS-CoV-2 target nucleic acids are NOT DETECTED. The SARS-CoV-2 RNA is generally detectable in upper and lower respiratory specimens during the acute phase of infection. Negative results do not preclude SARS-CoV-2 infection, do not rule out co-infections with other pathogens, and should not be used as the sole basis for treatment or other patient management decisions. Negative results must be combined with clinical observations, patient history, and epidemiological information. The expected result is Negative. Fact Sheet for Patients: https://www.fda.gov/media/138098/download Fact Sheet for Healthcare Providers: https://www.fda.gov/media/138095/download This test is not yet approved or cleared by the [place] FDA and  has been authorized for detection and/or diagnosis of SARS-CoV-2 by FDA under an Emergency Use Authorization (EUA). This EUA will remain  in effect (meaning this test can be used) for the duration of the COVID-19 declaration under Section 56 4(b)(1) of the Act, 21 U.S.C. section 360bbb-3(b)(1), unless the authorization is terminated or revoked sooner. Performed at [facility], [address] [place],  [address]     Disposition: Home  Discharge instruction: The patient was instructed to be ambulatory but told to refrain from heavy lifting, strenuous activity, or driving.  Still stool softener recommended and no straining with bowel movement.  Discharge medications:  Allergies as of 11/07/2018      Reactions   Antihistamines, Chlorpheniramine-type Other (See Comments)     Can't urinate      Medication List    TAKE these medications   carbidopa-levodopa 25-100 MG tablet Commonly known  as: SINEMET IR Take 1 tablet by mouth 3 (three) times daily.   cholecalciferol 1000 units tablet Commonly known as: VITAMIN D Take 1,000 Units by mouth daily.   Co Q 10 100 MG Caps Take 1 capsule by mouth daily.   enalapril 20 MG tablet Commonly known as: VASOTEC TAKE 1 TABLET BY MOUTH TWICE A DAY   Fish Oil 1200 MG Caps Take 1,200 mg by mouth 4 (four) times daily.   fluticasone 50 MCG/ACT nasal spray Commonly known as: FLONASE Place 2 sprays into both nostrils daily as needed for allergies.   furosemide 20 MG tablet Commonly known as: LASIX Take 1 tablet (20 mg total) by mouth daily as needed for edema. For 1 week take 2 pills in morning then resume normal dose. What changed:   reasons to take this  additional instructions   hydrochlorothiazide 12.5 MG tablet Commonly known as: HYDRODIURIL Take 1 tablet (12.5 mg total) by mouth daily.   HYDROcodone-acetaminophen 5-325 MG tablet Commonly known as: NORCO/VICODIN Take 1 tablet by mouth every 4 (four) hours as needed for up to 3 days for moderate pain.   MELATONIN PO Take 1 tablet by mouth at bedtime.   meloxicam 15 MG tablet Commonly known as: MOBIC TAKE 1 TABLET (15 MG TOTAL) BY MOUTH DAILY AS NEEDED (MODERATE BACK PAIN).   omeprazole 20 MG capsule Commonly known as: PRILOSEC Take 20 mg by mouth daily.   tamsulosin 0.4 MG Caps capsule Commonly known as: FLOMAX Take 1 capsule (0.4 mg total) by mouth daily.   vitamin C 500 MG tablet Commonly known as: ASCORBIC ACID Take 500 mg by mouth daily.       Followup: 11/09/2018 at [address] [facility]  

## 2018-11-07 NOTE — Progress Notes (Signed)
Randy [name] to be D/C'd home per MD order.  Discussed prescriptions and follow up appointments with the patient. Prescriptions given to patient, medication list explained in detail. Pt verbalized understanding.  Allergies as of 11/07/2018      Reactions   Antihistamines, Chlorpheniramine-type Other (See Comments)   Can't urinate      Medication List    TAKE these medications   carbidopa-levodopa 25-100 MG tablet Commonly known as: SINEMET IR Take 1 tablet by mouth 3 (three) times daily.   cholecalciferol 1000 units tablet Commonly known as: VITAMIN D Take 1,000 Units by mouth daily.   Co Q 10 100 MG Caps Take 1 capsule by mouth daily.   enalapril 20 MG tablet Commonly known as: VASOTEC TAKE 1 TABLET BY MOUTH TWICE A DAY   Fish Oil 1200 MG Caps Take 1,200 mg by mouth 4 (four) times daily.   fluticasone 50 MCG/ACT nasal spray Commonly known as: FLONASE Place 2 sprays into both nostrils daily as needed for allergies.   furosemide 20 MG tablet Commonly known as: LASIX Take 1 tablet (20 mg total) by mouth daily as needed for edema. For 1 week take 2 pills in morning then resume normal dose. What changed:   reasons to take this  additional instructions   hydrochlorothiazide 12.5 MG tablet Commonly known as: HYDRODIURIL Take 1 tablet (12.5 mg total) by mouth daily.   HYDROcodone-acetaminophen 5-325 MG tablet Commonly known as: NORCO/VICODIN Take 1 tablet by mouth every 4 (four) hours as needed for up to 3 days for moderate pain.   MELATONIN PO Take 1 tablet by mouth at bedtime.   meloxicam 15 MG tablet Commonly known as: MOBIC TAKE 1 TABLET (15 MG TOTAL) BY MOUTH DAILY AS NEEDED (MODERATE BACK PAIN).   omeprazole 20 MG capsule Commonly known as: PRILOSEC Take 20 mg by mouth daily.   tamsulosin 0.4 MG Caps capsule Commonly known as: FLOMAX Take 1 capsule (0.4 mg total) by mouth daily.   vitamin C 500 MG tablet Commonly known as: ASCORBIC ACID Take 500 mg by  mouth daily.       Vitals:   11/06/18 2130 11/07/18 0451  BP: 102/76 115/71  Pulse: 89 64  Resp: 18 16  Temp: 97.7 F (36.5 C) 98.4 F (36.9 C)  SpO2: 95% 98%    Skin clean, dry and intact without evidence of skin break down, no evidence of skin tears noted. IV catheter discontinued intact. Site without signs and symptoms of complications. Dressing and pressure applied. Pt denies pain at this time. No complaints noted.  An After Visit Summary was printed and given to the patient. Patient escorted via [place], and D/C home via private auto.  [name] RN [facility] [employer]

## 2018-11-07 NOTE — Care Management Obs Status (Signed)
[place] NOTIFICATION   Patient Details  Name: Randy [name] MRN: [id number] Date of Birth: [birth year]   Medicare Observation Status Notification Given:  No  Pt dc in under 24 hours  Azyriah Nevins A Aricka Goldberger, RN 11/07/2018, 11:42 AM

## 2018-11-07 NOTE — Progress Notes (Signed)
POD #1  No complaints this morning.  CBI discontinued at 715. VSS, afebrile  Exam: Abdomen soft, nontender.  Foley catheter draining moderate bloody urine however it is translucent without clots.  The catheter was irrigated.  No clots were present and the irrigant was pink-tinged after 2 syringes of irrigation.  Impression: Stable status post HOLEP.  Plan:  -Discharge home with indwelling Foley -Discussed with daughter to encourage Mr. Heinig to push fluids -Follow-up 11/2 for catheter removal

## 2018-11-07 NOTE — Progress Notes (Signed)
CBI stopped as per md's orders, urine grapefruit in color no clots noted

## 2018-11-08 ENCOUNTER — Encounter: Payer: Self-pay | Admitting: Urology

## 2018-11-09 ENCOUNTER — Encounter: Payer: Self-pay | Admitting: Physician Assistant

## 2018-11-09 ENCOUNTER — Other Ambulatory Visit: Payer: Self-pay

## 2018-11-09 ENCOUNTER — Ambulatory Visit (INDEPENDENT_AMBULATORY_CARE_PROVIDER_SITE_OTHER): Payer: Medicare Other | Admitting: Physician Assistant

## 2018-11-09 ENCOUNTER — Ambulatory Visit: Payer: Medicare Other | Admitting: Physician Assistant

## 2018-11-09 VITALS — BP 114/74 | HR 79 | Ht 72.0 in | Wt 215.0 lb

## 2018-11-09 DIAGNOSIS — N401 Enlarged prostate with lower urinary tract symptoms: Secondary | ICD-10-CM

## 2018-11-09 DIAGNOSIS — N138 Other obstructive and reflux uropathy: Secondary | ICD-10-CM

## 2018-11-09 LAB — BLADDER SCAN AMB NON-IMAGING
Scan Result: 21 mL
Scan Result: 80 mL

## 2018-11-09 NOTE — Progress Notes (Signed)
Fill and Pull Catheter Removal  Patient is present today for a catheter removal.  Patient was cleaned and prepped in a sterile fashion. 272ml of sterile saline was instilled into the bladder when the patient felt the urge to urinate. 80ml of water was then drained from the balloon.  A 24FR 3-way foley cath was removed from the bladder with some leaking noted around the tube.  Patient as then given some time to void on their own.  He was unable to do so, however he noted he did not feel that his bladder was full. PVR 81mL.  Performed by: [name], MD and Debroah Loop, PA-C  AM Additional notes: Patient was accompanied by his daughter today.  She reported one episode of a large blood clot per catheter yesterday with difficulty irrigating.  Catheter was irrigated today in clinic with 200 cc normal saline with adequate urinary return.  Urine was noted to be crystal clear and without clots.  Patient denied abdominal pain, distention, or fullness.  Patient to return to clinic this afternoon for repeat PVR and continue daily tamsulosin until his follow-up appointment with Dr. Diamantina Providence in 6 weeks.  Results for orders placed or performed in visit on 11/09/18  BLADDER SCAN AMB NON-IMAGING  Result Value Ref Range   Scan Result 21 mL  BLADDER SCAN AMB NON-IMAGING  Result Value Ref Range   Scan Result 80 mL   PM Follow up: Patient returned to the clinic this afternoon. He reports drinking approximately 116oz of fluid since this morning. He has urinated twice at home. He urinated again upon arrival to the clinic, subsequent PVR 61mL. Reiterated instructions to continue Flomax until his follow-up with Dr. Diamantina Providence.   I counseled the patient on signs and symptoms of urinary retention, including lower abdominal pain, lumbar pain, abdominal distention, and the inability to urinate.  I also counseled him on worrying signs of hematuria, including thick, ketchup-like bleeding and color darker than cherry  red. I advised him to contact the office for assistance if he develops these symptoms during routine office hours, 8 AM to 5 PM Monday through Friday.  If outside those hours, I advised him to proceed to the emergency department. He expressed understanding.

## 2018-11-10 LAB — SURGICAL PATHOLOGY

## 2018-11-12 NOTE — Anesthesia Postprocedure Evaluation (Signed)
Anesthesia Post Note  Patient: Randy [name]  Procedure(s) Performed: HOLEP-LASER ENUCLEATION OF THE PROSTATE WITH MORCELLATION (N/A Prostate)  Patient location during evaluation: PACU Anesthesia Type: General Level of consciousness: awake and alert Pain management: pain level controlled Vital Signs Assessment: post-procedure vital signs reviewed and stable Respiratory status: spontaneous breathing, nonlabored ventilation, respiratory function stable and patient connected to nasal cannula oxygen Cardiovascular status: blood pressure returned to baseline and stable Postop Assessment: no apparent nausea or vomiting Anesthetic complications: no     Last Vitals:  Vitals:   11/06/18 2130 11/07/18 0451  BP: 102/76 115/71  Pulse: 89 64  Resp: 18 16  Temp: 36.5 C 36.9 C  SpO2: 95% 98%    Last Pain:  Vitals:   11/07/18 1149  TempSrc:   PainSc: 0-No pain                 Alphonsus Sias

## 2018-11-19 ENCOUNTER — Ambulatory Visit: Payer: Self-pay | Admitting: Licensed Clinical Social Worker

## 2018-11-19 DIAGNOSIS — F5101 Primary insomnia: Secondary | ICD-10-CM

## 2018-11-19 DIAGNOSIS — R413 Other amnesia: Secondary | ICD-10-CM

## 2018-11-19 NOTE — Chronic Care Management (AMB) (Signed)
Chronic Care Management    Clinical Social Work Follow Up Note  11/19/2018 Name: Randy [name] MRN: [id number] DOB: 07-Feb-1934  Randy [name] is a 84 y.o. year old male who is a primary care patient of [name], DO. The CCM team was consulted for assistance with Level of Care Concerns.   Review of patient status, including review of consultants reports, other relevant assessments, and collaboration with appropriate care team members and the patient's provider was performed as part of comprehensive patient evaluation and provision of chronic care management services.    SDOH (Social Determinants of Health) screening performed today: Stress. See Care Plan for related entries.   Advanced Directives Status: <no information> See Care Plan for related entries.   Outpatient Encounter Medications as of 11/19/2018  Medication Sig  . carbidopa-levodopa (SINEMET IR) 25-100 MG tablet Take 1 tablet by mouth 3 (three) times daily.  . cholecalciferol (VITAMIN D) 1000 units tablet Take 1,000 Units by mouth daily.  . Coenzyme Q10 (CO Q 10) 100 MG CAPS Take 1 capsule by mouth daily.   . enalapril (VASOTEC) 20 MG tablet TAKE 1 TABLET BY MOUTH TWICE A DAY  . fluticasone (FLONASE) 50 MCG/ACT nasal spray Place 2 sprays into both nostrils daily as needed for allergies.   . furosemide (LASIX) 20 MG tablet Take 1 tablet (20 mg total) by mouth daily as needed for edema. For 1 week take 2 pills in morning then resume normal dose. (Patient taking differently: Take 20 mg by mouth daily as needed for fluid or edema. )  . hydrochlorothiazide (HYDRODIURIL) 12.5 MG tablet Take 1 tablet (12.5 mg total) by mouth daily.  [name] MELATONIN PO Take 1 tablet by mouth at bedtime.  . meloxicam (MOBIC) 15 MG tablet TAKE 1 TABLET (15 MG TOTAL) BY MOUTH DAILY AS NEEDED (MODERATE BACK PAIN).  . Omega-3 Fatty Acids (FISH OIL) 1200 MG CAPS Take 1,200 mg by mouth 4 (four) times daily.   [name] omeprazole (PRILOSEC) 20 MG capsule Take  20 mg by mouth daily.  . tamsulosin (FLOMAX) 0.4 MG CAPS capsule Take 1 capsule (0.4 mg total) by mouth daily.  . vitamin C (ASCORBIC ACID) 500 MG tablet Take 500 mg by mouth daily.   No facility-administered encounter medications on file as of 11/19/2018.      Goals Addressed    . "I'm having a hard time managing my health" (pt-stated)       Current Barriers:  . Financial constraints . Level of care concerns . ADL IADL limitations . Social Isolation . Limited access to caregiver . Memory Deficits . Lacks knowledge of community resource: personal care service resources within the area that may benefit patient  Clinical Social Work Clinical Goal(s):  [name] Over the next 90 days, client will work with SW to address concerns related to lack of care/support within the home  Interventions: . Patient interviewed and appropriate assessments performed . Provided patient and family with information about personal care service resources within the area. Patient reports that he lives with hiis spouse and daughter and that his daughter is his main support and assist him with transportation/meal prep/daily needs.  . Discussed plans with patient for ongoing care management follow up and provided patient with direct contact information for care management team . Advised patient to contact CCM team with any urgent concerns . Assisted patient/caregiver with obtaining information about health plan benefits . Provided education and assistance to client regarding Advanced Directives. . Provided education to patient/caregiver  regarding level of care options. [name] LCSW provided self-care education. Patient has ongoing issue with memory and sleep. Patient was encouraged to take medications as prescribed and to ask for help (daughter or spouse) when needed and to try to check blood pressure and record it once a day per PCP's recommendation.  . Daughter reports that pt has a neurologist appointment on 11/23. Daughter  is looking forward to this appointment. . Daughter reports pt is still taking melatonin dose of 10mg  per night but still struggles with insomnia. . Daughter declined needing MOW's at this time.   Patient Self Care Activities:  . Attends all scheduled provider appointments . Calls provider office for new concerns or questions  Please see past updates related to this goal by clicking on the "Past Updates" button in the selected goal      Follow Up Plan: SW will follow up with patient by phone over the next quarter  Eula Fried, [place], MSW, [email or web address] Phone: [phone]

## 2018-11-26 ENCOUNTER — Other Ambulatory Visit: Payer: Self-pay | Admitting: Family Medicine

## 2018-11-26 ENCOUNTER — Ambulatory Visit: Payer: Self-pay | Admitting: *Deleted

## 2018-11-26 DIAGNOSIS — R296 Repeated falls: Secondary | ICD-10-CM

## 2018-11-26 DIAGNOSIS — I10 Essential (primary) hypertension: Secondary | ICD-10-CM

## 2018-11-26 DIAGNOSIS — R413 Other amnesia: Secondary | ICD-10-CM

## 2018-11-29 NOTE — Patient Instructions (Signed)
Thank you allowing the Chronic Care Management Team to be a part of your care! It was a pleasure speaking with you today!   CCM (Chronic Care Management) Team   Donicia Druck RN, BSN Nurse Care Coordinator  [phone]  [name] PharmD  Clinical Pharmacist  [phone]  Eula Fried LCSW Clinical Social Worker [phone]  Goals Addressed            This Visit's Progress   . [name]'s memory is failing and he is falling (pt-stated)       Current Barriers:  [name] Knowledge Deficits related to falls prevention . Cognitive Deficits . Memory issues  Nurse Case Manager Clinical Goal(s):  [name] Over the next 90 days, patient will work with [facility]  to address needs related to falls prevention and memory improvement  Interventions:  . Provided verbal education re: Potential causes of falls and Fall prevention strategies . Assessed for falls since last encounter.-None patient compliant using cane  . Assessed patients knowledge of fall risk prevention secondary to previously provided education.-Nursing continues for foley care. . Evaluation of current treatment plan related to dementia and patient's adherence to plan as established by provider. [name] with PCP and CCM Pharm D  regarding patient's symptoms and daughter's concerns. Both advised to request patient stop Hydroclorathiazide and continue to monitor blood pressures.  . Discussed plans with patient for ongoing care management follow up and provided patient with direct contact information for care management team . Spoke with daughter [name] today. She expressed concern for her dad related to some episodes he recently complained about where he felt dizziness and light headed and felt he was going to pass out.  . Daughter expressed this was happening 3-4 times in the last 2 weeks.  . She stated he dad does check his blood pressures daily and she noted some readings on the lower side.  . Placed a call to [facility] to request they do orthostatic b/ps while in the home. Spoke with Lilia Pro @[phone]  she stated they would be able to do that tomorrow. She also stated PT had noted some lower b/p's when they were in the home. . Placed a call to patient's home spouse answered and was able to read off patient's most recent blood pressures.: . DATE . 11/12 . 11/13 . 11/14 . 11/15 . 11/16 .  AM . 143/70  . 103/65 . HR77 . 124/80 . [id number] . 119/64 . [id number] . 124/78 . HR72 .  PM . 134/76 . [id number] .  . 109/77 . [id number] . 100/67 . [id number] . 124/77 . [id number]   . Placed a call back to daughter making her aware to discontinue patient's hydrochlorothiazide out of his pill box and have patient continue to monitor b/p. She verbalized understanding.     Patient Self Care Activities:  . [name] (assistive device) appropriately with all ambulation . De-clutter walkways . Change positions slowly . Wear secure fitting shoes at all times with ambulation . Utilize home lighting for dim lit areas . Have self and pet awareness at all times   Patient Self Care Activities:  . Currently UNABLE TO independently prevent falls  . Attends all scheduled provider appointments . Performs ADL's independently  Please see past updates related to this goal by clicking on the "Past Updates" button in the selected goal         The patient verbalized understanding of instructions provided today and declined a print copy of patient  instruction materials.   The patient has been provided with contact information for the care management team and has been advised to call with any health related questions or concerns.

## 2018-11-29 NOTE — Chronic Care Management (AMB) (Signed)
Chronic Care Management   Follow Up Note   11/26/2018 Name: Randy [name] MRN: [id number] DOB: Oct 03, 1934  Referred by: [name], DO Reason for referral : Chronic Care Management (Dizziness, Memory issues)   Randy [name] is a 84 y.o. year old male who is a primary care patient of [name], DO. The CCM team was consulted for assistance with chronic disease management and care coordination needs.    Review of patient status, including review of consultants reports, relevant laboratory and other test results, and collaboration with appropriate care team members and the patient's provider was performed as part of comprehensive patient evaluation and provision of chronic care management services.    SDOH (Social Determinants of Health) screening performed today: None. See Care Plan for related entries.   Outpatient Encounter Medications as of 11/26/2018  Medication Sig  . carbidopa-levodopa (SINEMET IR) 25-100 MG tablet Take 1 tablet by mouth 3 (three) times daily.  . cholecalciferol (VITAMIN D) 1000 units tablet Take 1,000 Units by mouth daily.  . Coenzyme Q10 (CO Q 10) 100 MG CAPS Take 1 capsule by mouth daily.   . enalapril (VASOTEC) 20 MG tablet TAKE 1 TABLET BY MOUTH TWICE A DAY  . fluticasone (FLONASE) 50 MCG/ACT nasal spray Place 2 sprays into both nostrils daily as needed for allergies.   . furosemide (LASIX) 20 MG tablet Take 1 tablet (20 mg total) by mouth daily as needed for edema. For 1 week take 2 pills in morning then resume normal dose. (Patient taking differently: Take 20 mg by mouth daily as needed for fluid or edema. )  . MELATONIN PO Take 1 tablet by mouth at bedtime.  . meloxicam (MOBIC) 15 MG tablet TAKE 1 TABLET (15 MG TOTAL) BY MOUTH DAILY AS NEEDED (MODERATE BACK PAIN).  . Omega-3 Fatty Acids (FISH OIL) 1200 MG CAPS Take 1,200 mg by mouth 4 (four) times daily.   [name] omeprazole (PRILOSEC) 20 MG capsule Take 20 mg by mouth daily.  .  tamsulosin (FLOMAX) 0.4 MG CAPS capsule Take 1 capsule (0.4 mg total) by mouth daily.  . vitamin C (ASCORBIC ACID) 500 MG tablet Take 500 mg by mouth daily.  . [DISCONTINUED] hydrochlorothiazide (HYDRODIURIL) 12.5 MG tablet Take 1 tablet (12.5 mg total) by mouth daily.   No facility-administered encounter medications on file as of 11/26/2018.      Goals Addressed            This Visit's Progress   . [name]'s memory is failing and he is falling (pt-stated)       Current Barriers:  [name] Knowledge Deficits related to falls prevention . Cognitive Deficits . Memory issues  Nurse Case Manager Clinical Goal(s):  [name] Over the next 90 days, patient will work with [facility]  to address needs related to falls prevention and memory improvement  Interventions:  . Provided verbal education re: Potential causes of falls and Fall prevention strategies . Assessed for falls since last encounter.-None patient compliant using cane  . Assessed patients knowledge of fall risk prevention secondary to previously provided education.-Nursing continues for foley care. . Evaluation of current treatment plan related to dementia and patient's adherence to plan as established by provider. [name] with PCP and CCM Pharm D  regarding patient's symptoms and daughter's concerns. Both advised to request patient stop Hydroclorathiazide and continue to monitor blood pressures.  . Discussed plans with patient for ongoing care management follow up and provided patient with direct contact information for care  management team . Spoke with daughter [name] today. She expressed concern for her dad related to some episodes he recently complained about where he felt dizziness and light headed and felt he was going to pass out.  . Daughter expressed this was happening 3-4 times in the last 2 weeks.  . She stated he dad does check his blood pressures daily and she noted some readings on the lower side.  . Placed a call to [facility] to request they do orthostatic b/ps while in the home. Spoke with Lilia Pro @[phone]  she stated they would be able to do that tomorrow. She also stated PT had noted some lower b/p's when they were in the home. . Placed a call to patient's home spouse answered and was able to read off patient's most recent blood pressures.: . DATE . 11/12 . 11/13 . 11/14 . 11/15 . 11/16 .  AM . 143/70  . 103/65 . HR77 . 124/80 . [id number] . 119/64 . [id number] . 124/78 . HR72 .  PM . 134/76 . [id number] .  . 109/77 . [id number] . 100/67 . [id number] . 124/77 . [id number]   . Placed a call back to daughter making her aware to discontinue patient's hydrochlorothiazide out of his pill box and have patient continue to monitor b/p. She verbalized understanding.     Patient Self Care Activities:  . [name] (assistive device) appropriately with all ambulation . De-clutter walkways . Change positions slowly . Wear secure fitting shoes at all times with ambulation . Utilize home lighting for dim lit areas . Have self and pet awareness at all times   Patient Self Care Activities:  . Currently UNABLE TO independently prevent falls  . Attends all scheduled provider appointments . Performs ADL's independently  Please see past updates related to this goal by clicking on the "Past Updates" button in the selected goal          The care management team will reach out to the patient again over the next 60 days.  The patient has been provided with contact information for the care management team and has been advised to call with any health related questions or concerns.    Merlene Morse Sherryann Frese RN, BSN Nurse Case Pharmacist, [facility]  [phone]) [employer]

## 2018-12-07 ENCOUNTER — Ambulatory Visit: Payer: Self-pay | Admitting: Pharmacist

## 2018-12-07 ENCOUNTER — Other Ambulatory Visit: Payer: Self-pay | Admitting: Urology

## 2018-12-07 DIAGNOSIS — N138 Other obstructive and reflux uropathy: Secondary | ICD-10-CM

## 2018-12-07 DIAGNOSIS — R413 Other amnesia: Secondary | ICD-10-CM

## 2018-12-07 DIAGNOSIS — R339 Retention of urine, unspecified: Secondary | ICD-10-CM

## 2018-12-07 DIAGNOSIS — I10 Essential (primary) hypertension: Secondary | ICD-10-CM

## 2018-12-07 NOTE — Chronic Care Management (AMB) (Signed)
Chronic Care Management   Follow Up Note   12/07/2018 Name: Randy [name] MRN: [id number] DOB: 10-04-1934  Referred by: [name], DO Reason for referral : Chronic Care Management (Patient Phone Call)   Randy [name] is a 84 y.o. year old male who is a primary care patient of [name], DO. The CCM team was consulted for assistance with chronic disease management and care coordination needs. Randy [name] has a past medical history including, but not limited to: hypertension, osteoarthritis, hyperlipidemia, insomnia and memory loss.  I reached out to patient's daughter/caregiver, Randy [name], by phone today.   I also reached out to Randy [name] by phone today.   Review of patient status, including review of consultants reports, relevant laboratory and other test results, and collaboration with appropriate care team members and the patient's provider was performed as part of comprehensive patient evaluation and provision of chronic care management services.    Outpatient Encounter Medications as of 12/07/2018  Medication Sig  . enalapril (VASOTEC) 20 MG tablet TAKE 1 TABLET BY MOUTH TWICE A DAY  . carbidopa-levodopa (SINEMET IR) 25-100 MG tablet Take 1 tablet by mouth 3 (three) times daily.  . cholecalciferol (VITAMIN D) 1000 units tablet Take 1,000 Units by mouth daily.  . Coenzyme Q10 (CO Q 10) 100 MG CAPS Take 1 capsule by mouth daily.   . fluticasone (FLONASE) 50 MCG/ACT nasal spray Place 2 sprays into both nostrils daily as needed for allergies.   . furosemide (LASIX) 20 MG tablet Take 1 tablet (20 mg total) by mouth daily as needed for edema. For 1 week take 2 pills in morning then resume normal dose. (Patient not taking: Reported on 12/07/2018)  . MELATONIN PO Take 1 tablet by mouth at bedtime.  . meloxicam (MOBIC) 15 MG tablet TAKE 1 TABLET (15 MG TOTAL) BY MOUTH DAILY AS NEEDED (MODERATE BACK PAIN).  . Omega-3 Fatty Acids (FISH OIL) 1200 MG CAPS Take 1,200  mg by mouth 4 (four) times daily.   [name] omeprazole (PRILOSEC) 20 MG capsule Take 20 mg by mouth daily.  . tamsulosin (FLOMAX) 0.4 MG CAPS capsule Take 1 capsule (0.4 mg total) by mouth daily.  . vitamin C (ASCORBIC ACID) 500 MG tablet Take 500 mg by mouth daily.   No facility-administered encounter medications on file as of 12/07/2018.     Goals Addressed            This Visit's Progress   . PharmD - Medication Management       Current Barriers:  . Cognitive Deficits  Pharmacist Clinical Goal(s):  [name] Over the next 30 days, patient/caregiver will work with CM Pharmacist to address needs related to medication management  Interventions: . Follow up with patient and daughter regarding blood pressure monitoring and control o Reports patient taking: enalapril 20 mg twice daily o Confirms stopped hydrochlorothiazide on 11/19 as directed  o Review recent blood presure readings with patient (see below) o Patient denies/daughter denies patient report of any dizziness or lightheadedness since stopping hydrochlorothiazide . Counsel on importance of medication adherence o Daughter reports patient using weekly pillbox, but that he sometimes misses mid-day dose of carbidopa-levodopa o Encourage daughter to obtain medication reminder alarm for patient to use as adherence tool . Daugher reports [facility] closed services for patient in last week. However, reports Neurologist ordered re-start of PT through [facility] o Per 11/23 chart note, PA Autumn Konz planned to place referral for in home PT for  strengthening   Patient Self Care Activities:  . Patient to take medications as directed with assistance of daughter Randy [name] to aid Randy [name] with filling his weekly pillbox and monitoring for adherence . Attends all scheduled provider appointments . Calls pharmacy for medication refills . Calls provider office for new concerns or questions . Patient/daughter to keep log of BP readings  AM Blood  Pressure PM Blood Pressure  24 - November - 123/79, HR 75  25 - November - 120/71, HR 67  26 - November - 102/76, HR 73  27 - November - 145/82, HR 59  28 - November - 125/76, HR 60  29 - November - -  30 - November -     Please see past updates related to this goal by clicking on the "Past Updates" button in the selected goal         Plan  Telephone follow up appointment with care management team member scheduled for: 12/28 at 1 pm  [name], PharmD, [place] [phone]

## 2018-12-07 NOTE — Patient Instructions (Signed)
Thank you allowing the Chronic Care Management Team to be a part of your care! It was a pleasure speaking with you today!     CCM (Chronic Care Management) Team    Janci Minor RN, BSN Nurse Care Coordinator  [phone]   [name] PharmD  Clinical Pharmacist  [phone]   Eula Fried LCSW Clinical Social Worker [phone]  Visit Information  Goals Addressed            This Visit's Progress   . PharmD - Medication Management       Current Barriers:  . Cognitive Deficits  Pharmacist Clinical Goal(s):  [name] Over the next 30 days, patient/caregiver will work with CM Pharmacist to address needs related to medication management  Interventions: . Follow up with patient and daughter regarding blood pressure monitoring and control o Reports patient taking: enalapril 20 mg twice daily o Confirms stopped hydrochlorothiazide on 11/19 as directed  o Review recent blood presure readings with patient (see below) o Patient denies/daughter denies patient report of any dizziness or lightheadedness since stopping hydrochlorothiazide . Counsel on importance of medication adherence o Daughter reports patient using weekly pillbox, but that he sometimes misses mid-day dose of carbidopa-levodopa o Encourage daughter to obtain medication reminder alarm for patient to use as adherence tool . Daugher reports [facility] closed services for patient in last week. However, reports Neurologist ordered re-start of PT through [facility]  Patient Self Care Activities:  . Patient to take medications as directed with assistance of daughter [name] to aid Mr. Fiola with filling his weekly pillbox and monitoring for adherence . Attends all scheduled provider appointments . Calls pharmacy for medication refills . Calls provider office for new concerns or questions . Patient/daughter to keep log of BP readings  AM Blood Pressure PM Blood Pressure  24 - November - 123/79, HR 75  25 -  November - 120/71, HR 67  26 - November - 102/76, HR 73  27 - November - 145/82, HR 59  28 - November - 125/76, HR 60  29 - November - -  30 - November -     Please see past updates related to this goal by clicking on the "Past Updates" button in the selected goal         The patient verbalized understanding of instructions provided today and declined a print copy of patient instruction materials.   Telephone follow up appointment with care management team member scheduled for: 12/28 at 1 pm  [name], PharmD, [place] [phone]

## 2018-12-14 ENCOUNTER — Telehealth: Payer: Self-pay

## 2018-12-21 ENCOUNTER — Encounter: Payer: Self-pay | Admitting: Urology

## 2018-12-21 ENCOUNTER — Ambulatory Visit (INDEPENDENT_AMBULATORY_CARE_PROVIDER_SITE_OTHER): Payer: Medicare Other | Admitting: Urology

## 2018-12-21 ENCOUNTER — Other Ambulatory Visit: Payer: Self-pay

## 2018-12-21 VITALS — BP 136/78 | HR 69 | Ht 75.0 in | Wt <= 1120 oz

## 2018-12-21 DIAGNOSIS — N401 Enlarged prostate with lower urinary tract symptoms: Secondary | ICD-10-CM

## 2018-12-21 DIAGNOSIS — R339 Retention of urine, unspecified: Secondary | ICD-10-CM | POA: Diagnosis not present

## 2018-12-21 DIAGNOSIS — C61 Malignant neoplasm of prostate: Secondary | ICD-10-CM

## 2018-12-21 DIAGNOSIS — N138 Other obstructive and reflux uropathy: Secondary | ICD-10-CM

## 2018-12-21 LAB — BLADDER SCAN AMB NON-IMAGING: Scan Result: 28

## 2018-12-21 NOTE — Progress Notes (Signed)
   12/21/2018 2:18 PM   Randy [name] Apr 12, 1934 [id number]  Reason for visit: Follow up HoLEP  HPI: I saw [name] back in urology clinic with his daughter for follow-up after undergoing HOLEP/TURP.  He is a comorbid and frail 84 year old male with Lewy body dementia and urinary retention who underwent a HOLEP/TURP on 11/06/2018.  The planes were not well delineated and there was some bleeding and he was converted intra-op to a bipolar TURP.  He was admitted overnight for CBI, but urine cleared and he was discharged on postop day 1.  He passed a void trial the week after surgery with PVR of 80 mL.  He reports he continues to void well with a strong stream and denies any difficulty emptying, weak stream, urgency, frequency, or urinary incontinence.  PVR in clinic today is 28 mL.  His pathology from surgery did show some prostate adenocarcinoma grade group 3 with less than 5% of tissue involved.  PSA pre-op was 2.7 in June 2020.  I had a long conversation with the patient and his daughter about these pathology findings and new diagnosis of prostate cancer.  With his age, frailty, and diagnosis of Lewy body dementia, I strongly recommended a conservative approach with watchful waiting.  They are in agreement.  We discussed other options including radiation or ADT, as well as the side effects.  Repeat PSA today, call with results Repeat PSA in 6 months, if stable moved to yearly surveillance If PSA rapidly increased in the future could consider ADT Okay to discontinue Flomax  A total of 25 minutes were spent face-to-face with the patient, greater than 50% was spent in patient education, counseling, and coordination of care regarding TURP, urinary retention, and prostate cancer.  [name], [facility] [address], [place], [place] [address] [phone]

## 2018-12-22 ENCOUNTER — Telehealth: Payer: Self-pay | Admitting: *Deleted

## 2018-12-22 LAB — PSA: Prostate Specific Ag, Serum: 0.5 ng/mL (ref 0.0–4.0)

## 2018-12-22 NOTE — Telephone Encounter (Addendum)
Left message with details, asked to return call with any questions.   ----- Message from [name], MD sent at 12/22/2018  8:30 AM EST ----- Good news, PSA is 0.5 after surgery from 2.7 before, continue plan as discussed in clinic yesterday with follow up in 6 mo with PSA prior  Thanks [name], MD 12/22/2018

## 2019-01-04 ENCOUNTER — Ambulatory Visit (INDEPENDENT_AMBULATORY_CARE_PROVIDER_SITE_OTHER): Payer: Medicare Other | Admitting: Pharmacist

## 2019-01-04 DIAGNOSIS — I10 Essential (primary) hypertension: Secondary | ICD-10-CM

## 2019-01-04 DIAGNOSIS — R413 Other amnesia: Secondary | ICD-10-CM

## 2019-01-04 NOTE — Patient Instructions (Signed)
Thank you allowing the Chronic Care Management Team to be a part of your care! It was a pleasure speaking with you today!     CCM (Chronic Care Management) Team    Janci Minor RN, BSN Nurse Care Coordinator  [phone]   [name] PharmD  Clinical Pharmacist  [phone]   Eula Fried LCSW Clinical Social Worker [phone]  Visit Information  Goals Addressed            This Visit's Progress   . PharmD - Medication Management       Current Barriers:  . Cognitive Deficits  Pharmacist Clinical Goal(s):  [name] Over the next 30 days, patient/caregiver will work with CM Pharmacist to address needs related to medication management  Interventions: . Perform chart review . Follow up with patient and daughter regarding blood pressure monitoring and control o Reports patient taking: enalapril 20 mg twice daily o Denies having checked BP recently around the holidays o Reports last checked on 12/18: 135/86, HR 76 o Encourage patient to resume checking, keep log and contact office for abnormal readings . Daugher reports patient Nils Pyle been doing well with PT through [facility] . Counsel on importance of medication adherence o Daughter reports improvement in patient's adherence to mid-day dose of carbidopa-levodopa by having patient's wife aid with reminding patient and by changing location of pillbox . Place coordination of care call to [place] re: tamsulosin. Leave message requesting call back.  Patient Self Care Activities:  . Patient to take medications as directed with assistance of daughter [name] to aid Mr. Mcfail with filling his weekly pillbox and monitoring for adherence . Attends all scheduled provider appointments . Calls pharmacy for medication refills . Calls provider office for new concerns or questions . Patient/daughter to keep log of BP readings   Please see past updates related to this goal by clicking on the "Past  Updates" button in the selected goal         The patient verbalized understanding of instructions provided today and declined a print copy of patient instruction materials.   The care management team will reach out to the patient again over the next 7 days.   [name], PharmD, [place] [employer] [phone]

## 2019-01-04 NOTE — Chronic Care Management (AMB) (Signed)
Chronic Care Management   Follow Up Note   01/04/2019 Name: Randy [name] MRN: [id number] DOB: 17-Dec-1934  Referred by: [name], DO Reason for referral : Chronic Care Management (Patient/caregiver Phone Calls) and Care Coordination   Randy [name] is a 84 y.o. year old male who is a primary care patient of [name], DO. The CCM team was consulted for assistance with chronic disease management and care coordination needs.  Randy [name] has a past medical history including, but not limited to: hypertension, osteoarthritis, hyperlipidemia, insomnia and memory loss.  I reached out to patient's daughter/caregiver, Randy [name], by phone today.   I also reached out to Randy [name] by phone today.   Coordination of care call to Randy [name]  Review of patient status, including review of consultants reports, relevant laboratory and other test results, and collaboration with appropriate care team members and the patient's provider was performed as part of comprehensive patient evaluation and provision of chronic care management services.      Outpatient Encounter Medications as of 01/04/2019  Medication Sig  . carbidopa-levodopa (SINEMET IR) 25-100 MG tablet Take 1 tablet by mouth 3 (three) times daily.  . enalapril (VASOTEC) 20 MG tablet TAKE 1 TABLET BY MOUTH TWICE A DAY  . cholecalciferol (VITAMIN D) 1000 units tablet Take 1,000 Units by mouth daily.  . Coenzyme Q10 (CO Q 10) 100 MG CAPS Take 1 capsule by mouth daily.   . fluticasone (FLONASE) 50 MCG/ACT nasal spray Place 2 sprays into both nostrils daily as needed for allergies.   . furosemide (LASIX) 20 MG tablet Take 1 tablet (20 mg total) by mouth daily as needed for edema. For 1 week take 2 pills in morning then resume normal dose.  [name] MELATONIN PO Take 1 tablet by mouth at bedtime.  . meloxicam (MOBIC) 15 MG tablet TAKE 1 TABLET (15 MG TOTAL) BY MOUTH DAILY AS NEEDED (MODERATE BACK PAIN).    . Omega-3 Fatty Acids (FISH OIL) 1200 MG CAPS Take 1,200 mg by mouth 4 (four) times daily.   [name] omeprazole (PRILOSEC) 20 MG capsule Take 20 mg by mouth daily.  . vitamin C (ASCORBIC ACID) 500 MG tablet Take 500 mg by mouth daily.   No facility-administered encounter medications on file as of 01/04/2019.    Goals Addressed            This Visit's Progress   . PharmD - Medication Management       Current Barriers:  . Cognitive Deficits  Pharmacist Clinical Goal(s):  [name] Over the next 30 days, patient/caregiver will work with CM Pharmacist to address needs related to medication management  Interventions: . Perform chart review o Patient seen by Urologist on 12/18 for follow up post-HOLEP/TURP surgery - Pathology from surgery showed prostate adenocarcinoma grade group 3 with less than 5% of tissue involved - Last PSA 2.7 ng/mL (06/10/2018) - Per provider note, plan to: [name] PSA level drawn (0.5 ng/mL) at visit & to repeat again in 6 months . "If PSA rapidly increased in the future could consider ADT . Okay to discontinue Flomax"  . Follow up with patient and daughter regarding blood pressure monitoring and control o Reports patient taking: enalapril 20 mg twice daily o Denies having checked BP recently around the holidays o Reports last checked on 12/18: 135/86, HR 76 o Encourage patient to resume checking, keep log and contact office for abnormal readings . Daugher reports patient Randy [name] been doing well with  Randy [name] . Counsel on importance of medication adherence o Daughter reports improvement in patient's adherence to mid-day dose of carbidopa-levodopa by having patient's wife aid with reminding patient and by changing location of pillbox o Daughter denies receiving direction from Urologist to discontinue tamsulosin; reports patient continuing to take this medication. . Place coordination of care call to [name] re: tamsulosin. Leave message  requesting call back.  Patient Self Care Activities:  . Patient to take medications as directed with assistance of daughter Randy [name] to aid Randy [name] with filling his weekly pillbox and monitoring for adherence . Attends all scheduled provider appointments . Calls pharmacy for medication refills . Calls provider office for new concerns or questions . Patient/daughter to keep log of BP readings   Please see past updates related to this goal by clicking on the "Past Updates" button in the selected goal         Plan  The care management team will reach out to the patient again over the next 7 days.   [name], PharmD, [place] [employer] [phone]

## 2019-01-05 ENCOUNTER — Telehealth: Payer: Self-pay | Admitting: Urology

## 2019-01-05 NOTE — Telephone Encounter (Signed)
Benjamine Mola from [place] called and would like a return call regarding this patient's last app on 12-21-18. She said the notes said for the pt to stop his flomax and she has some questions about it. She can be reached at [phone]   thanks

## 2019-01-06 ENCOUNTER — Ambulatory Visit: Payer: Self-pay | Admitting: Pharmacist

## 2019-01-06 DIAGNOSIS — N401 Enlarged prostate with lower urinary tract symptoms: Secondary | ICD-10-CM

## 2019-01-06 DIAGNOSIS — N138 Other obstructive and reflux uropathy: Secondary | ICD-10-CM

## 2019-01-06 NOTE — Telephone Encounter (Signed)
Elizabeth from [place] would like for [place] to give the patient's daughter a call to confirm with her that it is ok her him to stop the flomax as I think she still has some questions about this. Can you please give her a call.   Thanks, Sharyn Lull

## 2019-01-06 NOTE — Telephone Encounter (Signed)
Left a detailed message for Randy [name] stating per Dr. sninsky's last note patient was to stop Flomax

## 2019-01-07 NOTE — Telephone Encounter (Signed)
Patient's daughter notified to stop medication per Dr. Doristine Counter last note

## 2019-01-07 NOTE — Chronic Care Management (AMB) (Signed)
  Chronic Care Management   Follow Up Note   01/07/2019 Name: Randy [name] MRN: [id number] DOB: 05/28/1934  Referred by: Randy Hauser, DO Reason for referral : Care Coordination (Urology)   Randy [name] is a 84 y.o. year old male who is a primary care patient of Randy Hauser, DO. The CCM team was consulted for assistance with chronic disease management and care coordination needs.    Again place coordination of care call to [place].  Review of patient status, including review of consultants reports, relevant laboratory and other test results, and collaboration with appropriate care team members and the patient's provider was performed as part of comprehensive patient evaluation and provision of chronic care management services.     Outpatient Encounter Medications as of 01/06/2019  Medication Sig  . cholecalciferol (VITAMIN D) 1000 units tablet Take 1,000 Units by mouth daily.  . Coenzyme Q10 (CO Q 10) 100 MG CAPS Take 1 capsule by mouth daily.   . enalapril (VASOTEC) 20 MG tablet TAKE 1 TABLET BY MOUTH TWICE A DAY  . fluticasone (FLONASE) 50 MCG/ACT nasal spray Place 2 sprays into both nostrils daily as needed for allergies.   . furosemide (LASIX) 20 MG tablet Take 1 tablet (20 mg total) by mouth daily as needed for edema. For 1 week take 2 pills in morning then resume normal dose.  [name] MELATONIN PO Take 1 tablet by mouth at bedtime.  . meloxicam (MOBIC) 15 MG tablet TAKE 1 TABLET (15 MG TOTAL) BY MOUTH DAILY AS NEEDED (MODERATE BACK PAIN).  . Omega-3 Fatty Acids (FISH OIL) 1200 MG CAPS Take 1,200 mg by mouth 4 (four) times daily.   [name] omeprazole (PRILOSEC) 20 MG capsule Take 20 mg by mouth daily.  . vitamin C (ASCORBIC ACID) 500 MG tablet Take 500 mg by mouth daily.   No facility-administered encounter medications on file as of 01/06/2019.    Goals Addressed            This Visit's Progress   . PharmD - Medication Management       Current Barriers:  . Cognitive Deficits  Pharmacist Clinical Goal(s):  [name] Over the next 30 days, patient/caregiver will work with CM Pharmacist to address needs related to medication management  Interventions: . Receive voicemail message back from Art[place] with [facility] regarding patient's tamsulosin o Return call to office. Note that that Dr. Doristine Counter plan from 12/14 office visit indicated patient "Okay to discontinue Flomax". However, daughter/patient denied receiving this direction from Urologist and patient has continued to take the medication.  o Request office reach back out to daughter/caregiver to clarify plan.  Patient Self Care Activities:  . Patient to take medications as directed with assistance of daughter o Daughter aids [name] with filling his weekly pillbox and monitoring for adherence . Attends all scheduled provider appointments . Calls pharmacy for medication refills . Calls provider office for new concerns or questions . Patient/daughter to keep log of BP readings   Please see past updates related to this goal by clicking on the "Past Updates" button in the selected goal         Plan  The care management team will reach out to the patient again over the next 30 days.   Randy [name], PharmD, [place] [employer] [phone]

## 2019-01-11 ENCOUNTER — Ambulatory Visit: Payer: Medicare Other | Admitting: Urology

## 2019-01-20 ENCOUNTER — Telehealth: Payer: Self-pay

## 2019-01-21 ENCOUNTER — Telehealth: Payer: Self-pay

## 2019-01-22 ENCOUNTER — Encounter: Payer: Self-pay | Admitting: Family Medicine

## 2019-01-22 ENCOUNTER — Other Ambulatory Visit: Payer: Self-pay

## 2019-01-22 ENCOUNTER — Ambulatory Visit (INDEPENDENT_AMBULATORY_CARE_PROVIDER_SITE_OTHER): Payer: Medicare Other | Admitting: Family Medicine

## 2019-01-22 VITALS — BP 121/75 | HR 77 | Temp 98.2°F | Resp 16 | Ht 72.0 in | Wt 235.0 lb

## 2019-01-22 DIAGNOSIS — M1712 Unilateral primary osteoarthritis, left knee: Secondary | ICD-10-CM

## 2019-01-22 MED ORDER — METHYLPREDNISOLONE ACETATE 40 MG/ML IJ SUSP
40.0000 mg | Freq: Once | INTRAMUSCULAR | Status: AC
  Administered 2019-01-22: 40 mg via INTRA_ARTICULAR

## 2019-01-22 MED ORDER — LIDOCAINE HCL (PF) 1 % IJ SOLN
4.0000 mL | Freq: Once | INTRAMUSCULAR | Status: AC
  Administered 2019-01-22: 4 mL

## 2019-01-22 MED ORDER — DICLOFENAC SODIUM 1 % EX GEL: 2.0000 g | Freq: Three times a day (TID) | CUTANEOUS | 2 refills | Status: DC | PRN

## 2019-01-22 NOTE — Patient Instructions (Addendum)
Thank you for coming to the office today.  Leg cramps - Try spoonful of yellow mustard to relieve leg cramps or try daily to prevent the problem  - OTC natural option is Hyland's Leg Cramps (Dissolving tablet) take as needed for muscle cramps  ---------------------------------------------  Try topical diclofenac cream up to 3 times a day  Take meloxicam if needed  Keep taking Tylenol  You received a Left Knee Joint steroid injection today. - Lidocaine numbing medicine may ease the pain initially for a few hours until it wears off - As discussed, you may experience a "steroid flare" this evening or within 24-48 hours, anytime medicine is injected into an inflamed joint it can cause the pain to get worse temporarily - Everyone responds differently to these injections, it depends on the patient and the severity of the joint problem, it may provide anywhere from days to weeks, to months of relief. Ideal response is >6 months relief - Try to take it easy for next 1-2 days, avoid over activity and strain on joint (limit walking for knee) - Recommend the following:   - For swelling - rest, compression sleeve / ACE wrap, elevation, and ice packs as needed for first few days   - For pain in future may use heating pad or moist heat as needed  If not improving we can refer to Orthopedics   Please schedule a Follow-up Appointment to: Return in about 3 months (around 04/22/2019) for 3-6 months follow-up Knee pain arthritis.  If you have any other questions or concerns, please feel free to call the office or send a message through [facility]. You may also schedule an earlier appointment if necessary.  Additionally, you may be receiving a survey about your experience at our office within a few days to 1 week by e-mail or mail. We value your feedback.  [name], DO [place]

## 2019-01-22 NOTE — Progress Notes (Signed)
Subjective:    Patient ID: Randy [name], male    DOB: 09-05-1934, 85 y.o.   MRN: [id number]  Randy [name] is a 85 y.o. male presenting on 01/22/2019 for Knee Pain (bilateral but left is worst as compare to right)   HPI   Osteoarthritis Bilateral Knees, Bilateral Knee pain (Right > Left) - Last visit with me 09/2018, for same problem, treated with bilateral knee steroid injection, see prior notes for background information. - Interval update with good results, lasted up to >3-4 months with pain relief - Today patient reports now return of pain on L side with knee pain stiffness aching, worse cold or rainy days, describes similar arthritis pain. Not using meloxicam regularly, he is taking tylenol, not tried topical treatment - He uses cane / walker at times - he has not had any new injury or fall - he has not seen orthopedic for this Denies redness swelling numbness tingling  Depression screen [facility] 2/9 01/22/2019 09/21/2018 09/16/2018  Decreased Interest 0 0 0  Down, Depressed, Hopeless 0 0 0  PHQ - 2 Score 0 0 0    Social History   Tobacco Use  . Smoking status: Former Smoker    Packs/day: 1.00    Years: 12.00    Pack years: 12.00    Types: Cigarettes    Quit date: 1968    Years since quitting: 53.0  . Smokeless tobacco: Former Network engineer Use Topics  . Alcohol use: No  . Drug use: No    Review of Systems Per HPI unless specifically indicated above     Objective:    BP 121/75   Pulse 77   Temp 98.2 F (36.8 C) (Oral)   Resp 16   Ht 6' (1.829 m)   Wt 235 lb (106.6 kg)   BMI 31.87 kg/m   Wt Readings from Last 3 Encounters:  01/22/19 235 lb (106.6 kg)  12/21/18 15 lb (6.804 kg)  11/09/18 215 lb (97.5 kg)    Physical Exam Vitals and nursing note reviewed.  Constitutional:      General: He is not in acute distress.    Appearance: He is well-developed. He is not diaphoretic.     Comments: Well-appearing, comfortable, cooperative  HENT:     Head:  Normocephalic and atraumatic.  Eyes:     General:        Right eye: No discharge.        Left eye: No discharge.     Conjunctiva/sclera: Conjunctivae normal.  Cardiovascular:     Rate and Rhythm: Normal rate.  Pulmonary:     Effort: Pulmonary effort is normal.  Musculoskeletal:     Comments: Bilateral knees, bulky appearance, crepitus positive, with some pain and stiffness on range of motion. Limited strength testing today.  Skin:    General: Skin is warm and dry.     Findings: No erythema or rash.  Neurological:     Mental Status: He is alert and oriented to person, place, and time.  Psychiatric:        Behavior: Behavior normal.     Comments: Well groomed, good eye contact, normal speech and thoughts      ________________________________________________________ PROCEDURE NOTE Date: 01/22/19 Left knee steroid injection Discussed benefits and risks (including pain, bleeding, infection, steroid flare). Verbal consent given by patient. Medication:  1 cc Depo-medrol 40mg  and 4 cc Lidocaine 1% without epi Time Out taken  Landmarks identified. Area cleansed with alcohol wipes. Using 21  gauge and 1, 1/2 inch needle, Left knee joint space was injected (with above listed medication) via medial approach cold spray used for superficial anesthetic. Sterile bandage placed. Patient tolerated procedure well without bleeding or paresthesias. No complications.   Results for orders placed or performed in visit on 12/21/18  PSA  Result Value Ref Range   Prostate Specific Ag, Serum 0.5 0.0 - 4.0 ng/mL  Bladder Scan (Post Void Residual) in office  Result Value Ref Range   Scan Result 28       Assessment & Plan:   Problem List Items Addressed This Visit    Tricompartment osteoarthritis of left knee - Primary   Relevant Medications   diclofenac Sodium (VOLTAREN) 1 % GEL      Persistent chronicBILATERAL medialKnee pain LEFT worse than Right Improved from steroid injections >6 months in  past, now last injection 3-4 months Without known injury or trauma Known OA DJD in other joints, L knee tricompartmental OA see prior x-ray - Able to bear weight, no mechanical locking - No prior history of knee surgery, arthroscopy  Plan: LEFT knee steroid injection performed today, see above procedure note Add topical diclofenac nsaid PRN -ContinueMeloxicam 15mg  daily for interval while improving Knee / Leg Continue Tylenol 500-1000mg  per dose TID PRN breakthrough RICE therapy Future if not improvingwill referOrthopedic secondary opinion - consider repeat injections in future effectiveq 3-6 months  Meds ordered this encounter  Medications  . diclofenac Sodium (VOLTAREN) 1 % GEL    Sig: Apply 2 g topically 3 (three) times daily as needed. To knee arthritis pain    Dispense:  100 g    Refill:  2  . lidocaine (PF) (XYLOCAINE) 1 % injection 4 mL  . methylPREDNISolone acetate (DEPO-MEDROL) injection 40 mg     Follow up plan: Return in about 3 months (around 04/22/2019) for 3-6 months follow-up Knee pain arthritis.  Randy [name], [facility] 01/22/2019, 3:39 PM

## 2019-01-26 ENCOUNTER — Ambulatory Visit (INDEPENDENT_AMBULATORY_CARE_PROVIDER_SITE_OTHER): Payer: Medicare Other | Admitting: Pharmacist

## 2019-01-26 DIAGNOSIS — I10 Essential (primary) hypertension: Secondary | ICD-10-CM | POA: Diagnosis not present

## 2019-01-26 NOTE — Chronic Care Management (AMB) (Signed)
Chronic Care Management   Follow Up Note   01/26/2019 Name: Randy [name] MRN: [id number] DOB: 1934/02/06  Referred by: Randy Hauser, DO Reason for referral : Chronic Care Management (Caregiver Phone Call)   Randy [name] is a 85 y.o. year old male who is a primary care patient of Randy Hauser, DO. The CCM team was consulted for assistance with chronic disease management and care coordination needs.  [name] has a past medical history including, but not limited to: hypertension, osteoarthritis, hyperlipidemia, insomnia and memory loss.  I reached out to patient's daughter/caregiver, Randy [name], by phone today.   Review of patient status, including review of consultants reports, relevant laboratory and other test results, and collaboration with appropriate care team members and the patient's provider was performed as part of comprehensive patient evaluation and provision of chronic care management services.     Outpatient Encounter Medications as of 01/26/2019  Medication Sig  . enalapril (VASOTEC) 20 MG tablet TAKE 1 TABLET BY MOUTH TWICE A DAY  . meloxicam (MOBIC) 15 MG tablet TAKE 1 TABLET (15 MG TOTAL) BY MOUTH DAILY AS NEEDED (MODERATE BACK PAIN).  . carbidopa-levodopa (SINEMET IR) 25-100 MG tablet Take 1 tablet by mouth 3 (three) times daily.  . cholecalciferol (VITAMIN D) 1000 units tablet Take 1,000 Units by mouth daily.  . Coenzyme Q10 (CO Q 10) 100 MG CAPS Take 1 capsule by mouth daily.   . diclofenac Sodium (VOLTAREN) 1 % GEL Apply 2 g topically 3 (three) times daily as needed. To knee arthritis pain  . fluticasone (FLONASE) 50 MCG/ACT nasal spray Place 2 sprays into both nostrils daily as needed for allergies.   . furosemide (LASIX) 20 MG tablet Take 1 tablet (20 mg total) by mouth daily as needed for edema. For 1 week take 2 pills in morning then resume normal dose.  [name] MELATONIN PO Take 1 tablet by mouth at bedtime.  . Omega-3 Fatty Acids (FISH OIL)  1200 MG CAPS Take 1,200 mg by mouth 4 (four) times daily.   [name] omeprazole (PRILOSEC) 20 MG capsule Take 20 mg by mouth daily.  . vitamin C (ASCORBIC ACID) 500 MG tablet Take 500 mg by mouth daily.   No facility-administered encounter medications on file as of 01/26/2019.    Goals Addressed            This Visit's Progress   . PharmD - Medication Management       Current Barriers:  . Cognitive Deficits  Pharmacist Clinical Goal(s):  [name] Over the next 30 days, patient/caregiver will work with CM Pharmacist to address needs related to medication management  Interventions: . Perform chart review. Note patient seen by PCP on 1/15 for knee pain . Follow up with caregiver regarding patient's knee pain ? Caregiver reports patient has been taking meloxicam as directed with food ? Reports has not yet obtained diclofenac gel, but plans to pick up from pharmacy ? Verbalizes understanding of administration instructions for patient to try, using as an alternative to using oral meloxicam when needed for pain . Follow up regarding direction from Urologist to discontinue tamsulosin ? Caregiver confirms discontinued tamsulosin as directed by Urology . Follow up regarding blood pressure monitoring and control ? Reports patient taking: enalapril 20 mg twice daily ? Reports last checked on 1/11: 143/83, HR 69; 1/9: 135/83, HR 66 ? Encourage to aid patient with continuing to check, keep log and contact office for readings outside of provided parameters or new symptoms  Patient Self Care Activities:  . Patient to take medications as directed with assistance of daughter o Daughter aids [name] with filling his weekly pillbox and monitoring for adherence . Attends all scheduled provider appointments . Calls pharmacy for medication refills . Calls provider office for new concerns or questions . Patient/daughter to keep log of BP readings   Please see past updates related to this goal by clicking on the  "Past Updates" button in the selected goal         Plan  Telephone follow up appointment with care management team member scheduled for: 3/16  [name], PharmD, [facility] [phone]

## 2019-01-26 NOTE — Patient Instructions (Signed)
Thank you allowing the Chronic Care Management Team to be a part of your care! It was a pleasure speaking with you today!     CCM (Chronic Care Management) Team    Noreene Larsson RN, MSN, CCM Nurse Care Coordinator  [phone]   [name] PharmD  Clinical Pharmacist  [phone]   Eula Fried LCSW Clinical Social Worker [phone]  Visit Information  Goals Addressed            This Visit's Progress   . PharmD - Medication Management       Current Barriers:  . Cognitive Deficits  Pharmacist Clinical Goal(s):  [name] Over the next 30 days, patient/caregiver will work with CM Pharmacist to address needs related to medication management  Interventions: . Perform chart review. Note patient seen by PCP on 1/15 for knee pain . Follow up with caregiver regarding patient's knee pain ? Caregiver reports patient has been taking meloxicam as directed with food ? Reports has not yet obtained diclofenac gel, but plans to pick up from pharmacy ? Verbalizes understanding of administration instructions for patient to try, using as an alternative to using oral meloxicam when needed for pain . Follow up regarding direction from Urologist to discontinue tamsulosin ? Caregiver confirms discontinued tamsulosin as directed by Urology . Follow up regarding blood pressure monitoring and control ? Reports patient taking: enalapril 20 mg twice daily ? Reports last checked on 1/11: 143/83, HR 69; 1/9: 135/83, HR 66 ? Encourage to aid patient with continuing to check, keep log and contact office for readings outside of provided parameters or new symptoms  Patient Self Care Activities:  . Patient to take medications as directed with assistance of daughter o Daughter aids Mr. Hafler with filling his weekly pillbox and monitoring for adherence . Attends all scheduled provider appointments . Calls pharmacy for medication refills . Calls provider office for new concerns or  questions . Patient/daughter to keep log of BP readings   Please see past updates related to this goal by clicking on the "Past Updates" button in the selected goal         The patient verbalized understanding of instructions provided today and declined a print copy of patient instruction materials.   Telephone follow up appointment with care management team member scheduled for: 3/16  [name], PharmD, [place] [employer] [phone]

## 2019-01-28 ENCOUNTER — Telehealth: Payer: Self-pay

## 2019-02-10 ENCOUNTER — Other Ambulatory Visit: Payer: Self-pay | Admitting: Family Medicine

## 2019-02-10 DIAGNOSIS — I10 Essential (primary) hypertension: Secondary | ICD-10-CM

## 2019-02-22 ENCOUNTER — Ambulatory Visit (INDEPENDENT_AMBULATORY_CARE_PROVIDER_SITE_OTHER): Payer: Medicare Other | Admitting: General Practice

## 2019-02-22 ENCOUNTER — Telehealth: Payer: Self-pay | Admitting: General Practice

## 2019-02-22 ENCOUNTER — Other Ambulatory Visit: Payer: Self-pay | Admitting: Family Medicine

## 2019-02-22 DIAGNOSIS — E782 Mixed hyperlipidemia: Secondary | ICD-10-CM

## 2019-02-22 DIAGNOSIS — I10 Essential (primary) hypertension: Secondary | ICD-10-CM

## 2019-02-22 DIAGNOSIS — R600 Localized edema: Secondary | ICD-10-CM

## 2019-02-22 DIAGNOSIS — R413 Other amnesia: Secondary | ICD-10-CM

## 2019-02-22 DIAGNOSIS — W19XXXD Unspecified fall, subsequent encounter: Secondary | ICD-10-CM

## 2019-02-22 MED ORDER — FUROSEMIDE 20 MG PO TABS: 20.0000 mg | ORAL_TABLET | Freq: Every day | ORAL | 2 refills | Status: DC | PRN

## 2019-02-22 NOTE — Patient Instructions (Signed)
Visit Information  Goals Addressed            This Visit's Progress   . [name]'s memory is failing (pt-stated)       Current Barriers:  [name] Knowledge Deficits related to falls prevention, decline in memory . Cognitive Deficits . Memory issues  Nurse Case Manager Clinical Goal(s):  [name] Over the next 90 days, patient will work with [facility]  to address needs related to falls prevention and memory improvement  Interventions:  . Provided verbal education re: Potential causes of falls and Fall prevention strategies . Assessed for falls since last encounter.-None patient compliant using cane Daughter confirms not new falls recently . Assessed patients knowledge of fall risk prevention secondary to previously provided education. Review of safety in the home  . Evaluation of current treatment plan related to dementia and patient's adherence to plan as established by provider. [name] with PCP and CCM Pharm D  regarding patient's symptoms and daughter's concerns. Both advised to request patient stop Hydroclorathiazide and continue to monitor blood pressure.  The patient remains off of the Hydrochlorothiazide  . Discussed plans with patient for ongoing care management follow up and provided patient with direct contact information for care management team . Reviewed episodes of dizziness and light headedness, per the daughter this has gotten a lot better since not taking HCTZ . She stated he dad does check his blood pressures daily, his blood pressure has been stable  . BP Readings from Last 3 Encounters: .  01/22/19 . 121/75 .  12/21/18 . 136/78 .  11/09/18 . 114/74       Patient Self Care Activities:  . [name] (assistive device) appropriately with all ambulation . De-clutter walkways . Change positions slowly . Wear secure fitting shoes at all times with ambulation . Utilize home lighting for dim lit areas . Have self and pet awareness at all times   Patient Self Care  Activities:  . Currently UNABLE TO independently prevent falls  . Attends all scheduled provider appointments . Performs ADL's independently  Please see past updates related to this goal by clicking on the "Past Updates" button in the selected goal      . RNCM: Recently Dad has had swelling in both of his legs and feet       Current Barriers:  [name] Knowledge Deficits related to management of HTN as evidence of swelling in feet and legs, not always being able to eat a heart healthy diet . Cognitive Deficits  Nurse Case Manager Clinical Goal(s):  [name] Over the next 90 days, patient will verbalize understanding of plan for managing HTN and HLD . Over the next 90 days, patient will work with [facility] [place] pharmacist and pcp to address needs related to managing HTN and increased swelling in feet and legs . Over the next 90 days, patient will demonstrate a decrease in bilateral lower extremity edema exacerbations as evidenced by normalized blood pressures and no evidence of edema present in bilateral lower legs . Over the next 90 days, patient will demonstrate improved adherence to prescribed treatment plan for wearing knee high ted hose as evidenced bydecreased bilateral lower leg edema . Over the next 90 days, patient will work with CM team pharmacist to review medications as needed and take Lasix as prescribed by pcp . Over the next 90 days, the patient will demonstrate ongoing self health care management ability as evidenced by elevating legs when sitting, taking meds as prescribed, following a heart healthy diet,  and wearing ted hose to reduce bilateral lower leg edema.  Interventions:  . Evaluation of current treatment plan related to HTN and HLD and patient's adherence to plan as established by provider. . Provided education to patient re: best way to put on ted hose with the use of a plastic grocery bag for ease of putting on hose. Also alternative compression socks" found on [place] or at [place] that  my be beneficial for the patient . Reviewed medications with patient and discussed the use of Lasix. The daughter verbalized the patient did not have any Lasix.  [name] Collaborated with pcp regarding Lasix RX. Dr. Parks Ranger to send in script for Lasix to use short term . Call back to the patients  daughter [name] and discussed Lasix use and parameters set by Dr. Parks Ranger . Discussed plans with patient for ongoing care management follow up and provided patient with direct contact information for care management team . Advised patient, providing education and rationale, to monitor blood pressure daily and record, calling pcp for findings outside established parameters.  . Provided patient with heart healthy  educational materials related to adhering to heart healthy diet  . Pharmacy referral for medication change follow up and support . Educated daughter on the [place] functionality to view notes/lab work and communicate with the pcp team  Patient Self Care Activities:  . Patient verbalizes understanding of plan to use Lasix prn for swelling in feet and legs per MD recommendations . Attends all scheduled provider appointments . Calls provider office for new concerns or questions . Unable to independently manage bilateral lower leg edema  Initial goal documentation        The patient verbalized understanding of instructions provided today and declined a print copy of patient instruction materials.   The care management team will reach out to the patient again over the next 30 to 60 days.   Noreene Larsson RN, MSN, [place] Mobile: [phone]  Edema  Edema is when you have too much fluid in your body or under your skin. Edema may make your legs, feet, and ankles swell up. Swelling is also common in looser tissues, like around your eyes. This is a common condition. It gets more common as you get older.  There are many possible causes of edema. Eating too much salt (sodium) and being on your feet or sitting for a long time can cause edema in your legs, feet, and ankles. Hot weather may make edema worse. Edema is usually painless. Your skin may look swollen or shiny. Follow these instructions at home:  Keep the swollen body part raised (elevated) above the level of your heart when you are sitting or lying down.  Do not sit still or stand for a long time.  Do not wear tight clothes. Do not wear garters on your upper legs.  Exercise your legs. This can help the swelling go down.  Wear elastic bandages or support stockings as told by your doctor.  Eat a low-salt (low-sodium) diet to reduce fluid as told by your doctor.  Depending on the cause of your swelling, you may need to limit how much fluid you drink (fluid restriction).  Take over-the-counter and prescription medicines only as told by your doctor. Contact a doctor if:  Treatment is not working.  You have heart, liver, or kidney disease and have symptoms of edema.  You have sudden and unexplained weight gain. Get help right  away if:  You have shortness of breath or chest pain.  You cannot breathe when you lie down.  You have pain, redness, or warmth in the swollen areas.  You have heart, liver, or kidney disease and get edema all of a sudden.  You have a fever and your symptoms get worse all of a sudden. Summary  Edema is when you have too much fluid in your body or under your skin.  Edema may make your legs, feet, and ankles swell up. Swelling is also common in looser tissues, like around your eyes.  Raise (elevate) the swollen body part above the level of your heart when you are sitting or lying down.  Follow your doctor's instructions about diet and how much fluid you can drink (fluid restriction). This information is not intended to replace advice given to you by your health care provider. Make sure you discuss any  questions you have with your health care provider. Document Revised: 12/27/2016 Document Reviewed: 01/12/2016 Elsevier Patient Education  [place] DASH stands for "Dietary Approaches to Stop Hypertension." The DASH eating plan is a healthy eating plan that has been shown to reduce high blood pressure (hypertension). It may also reduce your risk for type 2 diabetes, heart disease, and stroke. The DASH eating plan may also help with weight loss. What are tips for following this plan?  General guidelines  Avoid eating more than 2,300 mg (milligrams) of salt (sodium) a day. If you have hypertension, you may need to reduce your sodium intake to 1,500 mg a day.  Limit alcohol intake to no more than 1 drink a day for nonpregnant women and 2 drinks a day for men. One drink equals 12 oz of beer, 5 oz of wine, or 1 oz of hard liquor.  Work with your health care provider to maintain a healthy body weight or to lose weight. Ask what an ideal weight is for you.  Get at least 30 minutes of exercise that causes your heart to beat faster (aerobic exercise) most days of the week. Activities may include walking, swimming, or biking.  Work with your health care provider or diet and nutrition specialist (dietitian) to adjust your eating plan to your individual calorie needs. Reading food labels   Check food labels for the amount of sodium per serving. Choose foods with less than 5 percent of the Daily Value of sodium. Generally, foods with less than 300 mg of sodium per serving fit into this eating plan.  To find whole grains, look for the word "whole" as the first word in the ingredient list. Shopping  Buy products labeled as "low-sodium" or "no salt added."  Buy fresh foods. Avoid canned foods and premade or frozen meals. Cooking  Avoid adding salt when cooking. Use salt-free seasonings or herbs instead of table salt or sea salt. Check with your health care provider or  pharmacist before using salt substitutes.  Do not fry foods. Cook foods using healthy methods such as baking, boiling, grilling, and broiling instead.  Cook with heart-healthy oils, such as olive, canola, soybean, or sunflower oil. Meal planning  Eat a balanced diet that includes: ? 5 or more servings of fruits and vegetables each day. At each meal, try to fill half of your plate with fruits and vegetables. ? Up to 6-8 servings of whole grains each day. ? Less than 6 oz of lean meat, poultry, or fish each day. A 3-oz serving of meat is about  the same size as a deck of cards. One egg equals 1 oz. ? 2 servings of low-fat dairy each day. ? A serving of nuts, seeds, or beans 5 times each week. ? Heart-healthy fats. Healthy fats called Omega-3 fatty acids are found in foods such as flaxseeds and coldwater fish, like sardines, salmon, and mackerel.  Limit how much you eat of the following: ? Canned or prepackaged foods. ? Food that is high in trans fat, such as fried foods. ? Food that is high in saturated fat, such as fatty meat. ? Sweets, desserts, sugary drinks, and other foods with added sugar. ? Full-fat dairy products.  Do not salt foods before eating.  Try to eat at least 2 vegetarian meals each week.  Eat more home-cooked food and less restaurant, buffet, and fast food.  When eating at a restaurant, ask that your food be prepared with less salt or no salt, if possible. What foods are recommended? The items listed may not be a complete list. Talk with your dietitian about what dietary choices are best for you. Grains Whole-grain or whole-wheat bread. Whole-grain or whole-wheat pasta. Brown rice. [place]. Bulgur. Whole-grain and low-sodium cereals. Pita bread. Low-fat, low-sodium crackers. Whole-wheat flour tortillas. Vegetables Fresh or frozen vegetables (raw, steamed, roasted, or grilled). Low-sodium or reduced-sodium tomato and vegetable juice. Low-sodium or  reduced-sodium tomato sauce and tomato paste. Low-sodium or reduced-sodium canned vegetables. Fruits All fresh, dried, or frozen fruit. Canned fruit in natural juice (without added sugar). Meat and other protein foods Skinless chicken or [place]. Ground chicken or [place]. Pork with fat trimmed off. Fish and seafood. Egg whites. Dried beans, peas, or lentils. Unsalted nuts, nut butters, and seeds. Unsalted canned beans. Lean cuts of beef with fat trimmed off. Low-sodium, lean deli meat. Dairy Low-fat (1%) or fat-free (skim) milk. Fat-free, low-fat, or reduced-fat cheeses. Nonfat, low-sodium ricotta or cottage cheese. Low-fat or nonfat yogurt. Low-fat, low-sodium cheese. Fats and oils Soft margarine without trans fats. Vegetable oil. Low-fat, reduced-fat, or light mayonnaise and salad dressings (reduced-sodium). Canola, safflower, olive, soybean, and sunflower oils. Avocado. Seasoning and other foods Herbs. Spices. Seasoning mixes without salt. Unsalted popcorn and pretzels. Fat-free sweets. What foods are not recommended? The items listed may not be a complete list. Talk with your dietitian about what dietary choices are best for you. Grains Baked goods made with fat, such as croissants, muffins, or some breads. Dry pasta or rice meal packs. Vegetables Creamed or fried vegetables. Vegetables in a cheese sauce. Regular canned vegetables (not low-sodium or reduced-sodium). Regular canned tomato sauce and paste (not low-sodium or reduced-sodium). Regular tomato and vegetable juice (not low-sodium or reduced-sodium). Angie Fava. Olives. Fruits Canned fruit in a light or heavy syrup. Fried fruit. Fruit in cream or butter sauce. Meat and other protein foods Fatty cuts of meat. Ribs. Fried meat. Berniece Salines. Sausage. Bologna and other processed lunch meats. Salami. Fatback. Hotdogs. Bratwurst. Salted nuts and seeds. Canned beans with added salt. Canned or smoked fish. Whole eggs or egg yolks. Chicken or [place]  with skin. Dairy Whole or 2% milk, cream, and half-and-half. Whole or full-fat cream cheese. Whole-fat or sweetened yogurt. Full-fat cheese. Nondairy creamers. Whipped toppings. Processed cheese and cheese spreads. Fats and oils Butter. Stick margarine. Lard. Shortening. Ghee. Bacon fat. Tropical oils, such as coconut, palm kernel, or palm oil. Seasoning and other foods Salted popcorn and pretzels. Onion salt, garlic salt, seasoned salt, table salt, and sea salt. Worcestershire sauce. Tartar sauce. Barbecue sauce. Teriyaki sauce. Soy sauce,  including reduced-sodium. Steak sauce. Canned and packaged gravies. Fish sauce. Oyster sauce. Cocktail sauce. Horseradish that you find on the shelf. Ketchup. Mustard. Meat flavorings and tenderizers. Bouillon cubes. Hot sauce and Tabasco sauce. Premade or packaged marinades. Premade or packaged taco seasonings. Relishes. Regular salad dressings. Where to find more information:  National Heart, Lung, and [place]: https://wilson-eaton.com/  American Heart Association: www.heart.org Summary  The DASH eating plan is a healthy eating plan that has been shown to reduce high blood pressure (hypertension). It may also reduce your risk for type 2 diabetes, heart disease, and stroke.  With the DASH eating plan, you should limit salt (sodium) intake to 2,300 mg a day. If you have hypertension, you may need to reduce your sodium intake to 1,500 mg a day.  When on the DASH eating plan, aim to eat more fresh fruits and vegetables, whole grains, lean proteins, low-fat dairy, and heart-healthy fats.  Work with your health care provider or diet and nutrition specialist (dietitian) to adjust your eating plan to your individual calorie needs. This information is not intended to replace advice given to you by your health care provider. Make sure you discuss any questions you have with your health care provider. Document Revised: 12/06/2016 Document Reviewed:  12/18/2015 Elsevier Patient Education  2020 [employer].

## 2019-02-22 NOTE — Chronic Care Management (AMB) (Signed)
Chronic Care Management   Follow Up Note   02/22/2019 Name: Randy [name] MRN: [id number] DOB: 02-Aug-1934  Referred by: [name], DO Reason for referral : Chronic Care Management (Follow up: Memory Loss/HTN/HLD/Falls)   Randy [name] is a 85 y.o. year old male who is a primary care patient of [name], DO. The CCM team was consulted for assistance with chronic disease management and care coordination needs.    Review of patient status, including review of consultants reports, relevant laboratory and other test results, and collaboration with appropriate care team members and the patient's provider was performed as part of comprehensive patient evaluation and provision of chronic care management services.    SDOH (Social Determinants of Health) screening performed today: Physical Activity. See Care Plan for related entries.   Outpatient Encounter Medications as of 02/22/2019  Medication Sig   carbidopa-levodopa (SINEMET IR) 25-100 MG tablet Take 1 tablet by mouth 3 (three) times daily.   cholecalciferol (VITAMIN D) 1000 units tablet Take 1,000 Units by mouth daily.   Coenzyme Q10 (CO Q 10) 100 MG CAPS Take 1 capsule by mouth daily.    diclofenac Sodium (VOLTAREN) 1 % GEL Apply 2 g topically 3 (three) times daily as needed. To knee arthritis pain   enalapril (VASOTEC) 20 MG tablet TAKE 1 TABLET BY MOUTH TWICE A DAY   fluticasone (FLONASE) 50 MCG/ACT nasal spray Place 2 sprays into both nostrils daily as needed for allergies.    furosemide (LASIX) 20 MG tablet Take 1-2 tablets (20-40 mg total) by mouth daily as needed for edema. For up to 3-7 days at a time. If need 40mg , can take 2 at once or 1 twice a day.   MELATONIN PO Take 1 tablet by mouth at bedtime.   meloxicam (MOBIC) 15 MG tablet TAKE 1 TABLET (15 MG TOTAL) BY MOUTH DAILY AS NEEDED (MODERATE BACK PAIN).   Omega-3 Fatty Acids (FISH OIL) 1200 MG CAPS Take 1,200 mg by mouth 4 (four) times daily.     omeprazole (PRILOSEC) 20 MG capsule Take 20 mg by mouth daily.   vitamin C (ASCORBIC ACID) 500 MG tablet Take 500 mg by mouth daily.   [DISCONTINUED] furosemide (LASIX) 20 MG tablet Take 1 tablet (20 mg total) by mouth daily as needed for edema. For 1 week take 2 pills in morning then resume normal dose. (Patient not taking: Reported on 02/22/2019)   No facility-administered encounter medications on file as of 02/22/2019.     Objective:   Goals Addressed            This Visit's Progress    [name]'s memory is failing (pt-stated)       Current Barriers:   Knowledge Deficits related to falls prevention, decline in memory  Cognitive Deficits  Memory issues  Nurse Case Manager Clinical Goal(s):   Over the next 90 days, patient will work with [facility]  to address needs related to falls prevention and memory improvement  Interventions:   Provided verbal education re: Potential causes of falls and Fall prevention strategies  Assessed for falls since last encounter.-None patient compliant using cane Daughter confirms not new falls recently  Assessed patients knowledge of fall risk prevention secondary to previously provided education. Review of safety in the home   Evaluation of current treatment plan related to dementia and patient's adherence to plan as established by provider.  Collaborated with PCP and CCM Pharm D  regarding patient's symptoms and daughter's concerns. Both advised to  request patient stop Hydroclorathiazide and continue to monitor blood pressure.  The patient remains off of the Hydrochlorothiazide   Discussed plans with patient for ongoing care management follow up and provided patient with direct contact information for care management team  Reviewed episodes of dizziness and light headedness, per the daughter this has gotten a lot better since not taking HCTZ  She stated he dad does check his blood pressures daily, his blood pressure has been stable   BP  Readings from Last 3 Encounters:   01/22/19  121/75   12/21/18  136/78   11/09/18  114/74       Patient Self Care Activities:   [name] (assistive device) appropriately with all ambulation  De-clutter walkways  Change positions slowly  Wear secure fitting shoes at all times with ambulation  Utilize home lighting for dim lit areas  Have self and pet awareness at all times   Patient Self Care Activities:   Currently UNABLE TO independently prevent falls   Attends all scheduled provider appointments  Performs ADL's independently  Please see past updates related to this goal by clicking on the "Past Updates" button in the selected goal       RNCM: Recently Dad has had swelling in both of his legs and feet       Current Barriers:   Knowledge Deficits related to management of HTN as evidence of swelling in feet and legs, not always being able to eat a heart healthy diet  Cognitive Deficits  Nurse Case Manager Clinical Goal(s):   Over the next 90 days, patient will verbalize understanding of plan for managing HTN and HLD  Over the next 90 days, patient will work with [facility], [facility] pharmacist and pcp to address needs related to managing HTN and increased swelling in feet and legs  Over the next 90 days, patient will demonstrate a decrease in bilateral lower extremity edema exacerbations as evidenced by normalized blood pressures and no evidence of edema present in bilateral lower legs  Over the next 90 days, patient will demonstrate improved adherence to prescribed treatment plan for wearing knee high ted hose as evidenced bydecreased bilateral lower leg edema  Over the next 90 days, patient will work with CM team pharmacist to review medications as needed and take Lasix as prescribed by pcp  Over the next 90 days, the patient will demonstrate ongoing self health care management ability as evidenced by elevating legs when sitting, taking meds as prescribed,  following a heart healthy diet, and wearing ted hose to reduce bilateral lower leg edema.  Interventions:   Evaluation of current treatment plan related to HTN and HLD and patient's adherence to plan as established by provider.  Provided education to patient re: best way to put on ted hose with the use of a plastic grocery bag for ease of putting on hose. Also alternative compression socks" found on [place] or at [place] that my be beneficial for the patient  Reviewed medications with patient and discussed the use of Lasix. The daughter verbalized the patient did not have any Lasix.   Collaborated with pcp regarding Lasix RX. Dr. Parks Ranger to send in script for Lasix to use short term  Call back to the patients  daughter [name] and discussed Lasix use and parameters set by Dr. Parks Ranger  Discussed plans with patient for ongoing care management follow up and provided patient with direct contact information for care management team  Advised patient, providing education and rationale, to monitor blood  pressure daily and record, calling pcp for findings outside established parameters.   Provided patient with heart healthy  educational materials related to adhering to heart healthy diet   Pharmacy referral for medication change follow up and support  Educated daughter on the [place] functionality to view notes/lab work and communicate with the pcp team  Patient Self Care Activities:   Patient verbalizes understanding of plan to use Lasix prn for swelling in feet and legs per MD recommendations  Attends all scheduled provider appointments  Calls provider office for new concerns or questions  Unable to independently manage bilateral lower leg edema  Initial goal documentation         Plan:   The care management team will reach out to the patient again over the next 30 to 60 days.    Noreene Larsson RN, MSN, [place] Mobile: [phone]

## 2019-02-23 ENCOUNTER — Ambulatory Visit: Payer: Self-pay | Admitting: Licensed Clinical Social Worker

## 2019-02-23 DIAGNOSIS — M810 Age-related osteoporosis without current pathological fracture: Secondary | ICD-10-CM

## 2019-02-23 DIAGNOSIS — M545 Low back pain, unspecified: Secondary | ICD-10-CM

## 2019-02-23 DIAGNOSIS — I10 Essential (primary) hypertension: Secondary | ICD-10-CM

## 2019-02-23 DIAGNOSIS — E782 Mixed hyperlipidemia: Secondary | ICD-10-CM

## 2019-02-23 DIAGNOSIS — M47816 Spondylosis without myelopathy or radiculopathy, lumbar region: Secondary | ICD-10-CM

## 2019-02-23 DIAGNOSIS — G8929 Other chronic pain: Secondary | ICD-10-CM

## 2019-02-23 DIAGNOSIS — R413 Other amnesia: Secondary | ICD-10-CM

## 2019-02-24 NOTE — Chronic Care Management (AMB) (Signed)
Chronic Care Management    Clinical Social Work Follow Up Note  02/24/2019 Name: COURTLAN ABDULRAHMAN MRN: [id number] DOB: [birth year]  EANN ATKINSON is a 85 y.o. year old male who is a primary care patient of [name], DO. The CCM team was consulted for assistance with Mental Health Counseling and Resources.   Review of patient status, including review of consultants reports, other relevant assessments, and collaboration with appropriate care team members and the patient's provider was performed as part of comprehensive patient evaluation and provision of chronic care management services.    SDOH (Social Determinants of Health) screening performed today: Physical Activity. See Care Plan for related entries.   Advanced Directives Status: <no information> See Care Plan for related entries.   Outpatient Encounter Medications as of 02/23/2019  Medication Sig  . cholecalciferol (VITAMIN D) 1000 units tablet Take 1,000 Units by mouth daily.  . Coenzyme Q10 (CO Q 10) 100 MG CAPS Take 1 capsule by mouth daily.   . diclofenac Sodium (VOLTAREN) 1 % GEL Apply 2 g topically 3 (three) times daily as needed. To knee arthritis pain  . enalapril (VASOTEC) 20 MG tablet TAKE 1 TABLET BY MOUTH TWICE A DAY  . fluticasone (FLONASE) 50 MCG/ACT nasal spray Place 2 sprays into both nostrils daily as needed for allergies.   . furosemide (LASIX) 20 MG tablet Take 1-2 tablets (20-40 mg total) by mouth daily as needed for edema. For up to 3-7 days at a time. If need 40mg , can take 2 at once or 1 twice a day.  [name] MELATONIN PO Take 1 tablet by mouth at bedtime.  . meloxicam (MOBIC) 15 MG tablet TAKE 1 TABLET (15 MG TOTAL) BY MOUTH DAILY AS NEEDED (MODERATE BACK PAIN).  . Omega-3 Fatty Acids (FISH OIL) 1200 MG CAPS Take 1,200 mg by mouth 4 (four) times daily.   [name] omeprazole (PRILOSEC) 20 MG capsule Take 20 mg by mouth daily.  . vitamin C (ASCORBIC ACID) 500 MG tablet Take 500 mg by mouth daily.   No  facility-administered encounter medications on file as of 02/23/2019.     Goals Addressed    . "I'm having a hard time managing my health" (pt-stated)       Current Barriers:  . Financial constraints . Level of care concerns . ADL IADL limitations . Social Isolation . Limited access to caregiver . Memory Deficits . Lacks knowledge of community resource: personal care service resources within the area that may benefit patient  Clinical Social Work Clinical Goal(s):  [name] Over the next 90 days, client will work with SW to address concerns related to lack of care/support within the home  Interventions: . Patient interviewed and appropriate assessments performed . Provided patient and family with information about personal care service resources within the area. Patient reports that he lives with his spouse and daughter and that his daughter is his main support and assist him with transportation/meal prep/daily needs.  . Discussed plans with patient for ongoing care management follow up and provided patient with direct contact information for care management team . Advised patient to contact CCM team with any urgent concerns . Assisted patient/caregiver with obtaining information about health plan benefits . Provided education and assistance to client regarding Advanced Directives. . Provided education to patient/caregiver regarding level of care options. [name] LCSW provided self-care education. Patient has ongoing issue with memory and sleep. Patient was encouraged to take medications as prescribed and to ask for help (daughter or spouse)  when needed and to try to check blood pressure and record it once a day per PCP's recommendation.  . Patient completed a neurologist appointment on 11/23.  [name] Daughter/patient reports pt is still taking melatonin dose of 10mg  per night but still struggles with insomnia. . Daughter/patient declined needing MOW's at this time.  . Patient admits to feeling lonely at  times due to isolation related to COVID-19. Emotional support and coping skill education provided. Patient was very receptive to this information.  Patient Self Care Activities:  . Attends all scheduled provider appointments . Calls provider office for new concerns or questions  Please see past updates related to this goal by clicking on the "Past Updates" button in the selected goal      Follow Up Plan: SW will follow up with patient by phone over the next quarter  Eula Fried, [place], MSW, [email or web address] Phone: [phone]

## 2019-03-01 ENCOUNTER — Ambulatory Visit: Payer: Medicare Other | Admitting: Family Medicine

## 2019-03-01 ENCOUNTER — Telehealth: Payer: Self-pay | Admitting: Family Medicine

## 2019-03-01 NOTE — Telephone Encounter (Signed)
Patient informed as per Dr.K, she did not notice any blueness now but intermittent and advised when to seek urgent care. And if she wants to talk more about it she can schedule future face to face apt.

## 2019-03-01 NOTE — Telephone Encounter (Signed)
To clarify. CCM asked me for advice, and I changed his Furosemide. They are the ones that just contacted the patient/family about the medicine.  I changed instructions of his lasix on 02/22/19.  He was asked to scale back and reduce the dose to more as needed only if it improved and then hopefully could reduce dosage. If swelling returned he could use higher dose again temporarily.  I would need more information on the "blue toe" to figure out how to help with this.  Likely would have to see him in office for face to face if this is truly a concern.  Questions to ask  1. Is it just one toe or multiple or whole foot?  2. Is it cold, numb, tingling?  3. Is it painful?  If it is a sudden worsening problem - with all of the above, they probably should actually go straight to hospital ED or Urgent Care if he has - significant worsening, darker toe or blue toe, loss of sensation numb painful cold.  Randy [name], [facility] 03/01/2019, 4:33 PM

## 2019-03-01 NOTE — Telephone Encounter (Signed)
Patient's  daughter mentioning that from [place] had changed meds to Furosemide which make his swelling get improved but they have been noticing blue toe and concerned. Please suggest ?

## 2019-03-02 ENCOUNTER — Ambulatory Visit (INDEPENDENT_AMBULATORY_CARE_PROVIDER_SITE_OTHER): Payer: Medicare Other

## 2019-03-02 VITALS — BP 121/78

## 2019-03-02 DIAGNOSIS — Z0000 Encounter for general adult medical examination without abnormal findings: Secondary | ICD-10-CM

## 2019-03-02 NOTE — Progress Notes (Signed)
Subjective:   Randy [name] is a 85 y.o. male who presents for Medicare Annual/Subsequent preventive examination.  This visit is being conducted via phone call  - after an attmept to do on video chat - due to the COVID-19 pandemic. This patient has given me verbal consent via phone to conduct this visit, patient states they are participating from their home address. Some vital signs may be absent or patient reported.   Patient identification: identified by name, DOB, and current address.    Review of Systems:  Cardiac Risk Factors include: advanced age (>53men, >72 women);dyslipidemia;hypertension;male gender     Objective:    Vitals: BP 121/78   There is no height or weight on file to calculate BMI.  Advanced Directives 03/02/2019 11/06/2018 10/30/2018 09/10/2018 09/09/2018 09/08/2018 02/24/2018  Does Patient Have a Medical Advance Directive? Yes Yes Yes No - No Yes  Type of Advance Directive Living will;Healthcare Power of Attorney Living will [place];Living will - - - Living will;Healthcare Power of Attorney  Does patient want to make changes to medical advance directive? - No - Patient declined - - - - -  Copy of [place] in Chart? No - copy requested - - - - - No - copy requested  Would patient like information on creating a medical advance directive? - - - No - Patient declined No - Guardian declined;No - Patient declined - -    Tobacco Social History   Tobacco Use  Smoking Status Former Smoker  . Packs/day: 1.00  . Years: 12.00  . Pack years: 12.00  . Types: Cigarettes  . Quit date: 22  . Years since quitting: 53.1  Smokeless Tobacco Never Used     Counseling given: Not Answered   Clinical Intake:                       Past Medical History:  Diagnosis Date  . Arthritis   . GERD (gastroesophageal reflux disease)   . Hyperlipidemia   . Hypertension   . Osteoporosis   . Prostate enlargement    Past Surgical  History:  Procedure Laterality Date  . CHOLECYSTECTOMY N/A 09/10/2018   Procedure: LAPAROSCOPIC CHOLECYSTECTOMY;  Surgeon: Benjamine Sprague, DO;  Location: [facility] ORS;  Service: General;  Laterality: N/A;  . GANGLION CYST EXCISION Left 2013   Left upper arm  . HERNIA REPAIR N/A 2008   [place], ventral acquired hernia repair  . HOLEP-LASER ENUCLEATION OF THE PROSTATE WITH MORCELLATION N/A 11/06/2018   Procedure: HOLEP-LASER ENUCLEATION OF THE PROSTATE WITH MORCELLATION;  Surgeon: [name], MD;  Location: [facility] ORS;  Service: Urology;  Laterality: N/A;   Family History  Problem Relation Age of Onset  . Breast cancer Mother   . Lung cancer Father   . Heart disease Brother   . Leukemia Brother   . Prostate cancer Neg Hx   . Colon cancer Neg Hx   . Bladder Cancer Neg Hx   . Kidney cancer Neg Hx    Social History   Socioeconomic History  . Marital status: Married    Spouse name: Not on file  . Number of children: Not on file  . Years of education: College  . Highest education level: Not on file  Occupational History  . Occupation: Retired Clinical biochemist  Tobacco Use  . Smoking status: Former Smoker    Packs/day: 1.00    Years: 12.00    Pack years: 12.00  Types: Cigarettes    Quit date: 103    Years since quitting: 53.1  . Smokeless tobacco: Never Used  Substance and Sexual Activity  . Alcohol use: No  . Drug use: No  . Sexual activity: Not on file  Other Topics Concern  . Not on file  Social History Narrative  . Not on file   Social Determinants of Health   Financial Resource Strain:   . Difficulty of Paying Living Expenses: Not on file  Food Insecurity:   . Worried About Charity fundraiser in the Last Year: Not on file  . Ran Out of Food in the Last Year: Not on file  Transportation Needs:   . Lack of Transportation (Medical): Not on file  . Lack of Transportation (Non-Medical): Not on file  Physical Activity:   . Days of Exercise per Week: Not on file  .  Minutes of Exercise per Session: Not on file  Stress:   . Feeling of Stress : Not on file  Social Connections:   . Frequency of Communication with Friends and Family: Not on file  . Frequency of Social Gatherings with Friends and Family: Not on file  . Attends Religious Services: Not on file  . Active Member of Clubs or Organizations: Not on file  . Attends Archivist Meetings: Not on file  . Marital Status: Not on file    Outpatient Encounter Medications as of 03/02/2019  Medication Sig  . carbidopa-levodopa (SINEMET IR) 25-100 MG tablet Take 1 tablet by mouth 3 (three) times daily.  . cholecalciferol (VITAMIN D) 1000 units tablet Take 1,000 Units by mouth daily.  . Coenzyme Q10 (CO Q 10) 100 MG CAPS Take 1 capsule by mouth daily.   . diclofenac Sodium (VOLTAREN) 1 % GEL Apply 2 g topically 3 (three) times daily as needed. To knee arthritis pain  . enalapril (VASOTEC) 20 MG tablet TAKE 1 TABLET BY MOUTH TWICE A DAY  . fluticasone (FLONASE) 50 MCG/ACT nasal spray Place 2 sprays into both nostrils daily as needed for allergies.   . hydrochlorothiazide (MICROZIDE) 12.5 MG capsule Take 12.5 mg by mouth daily.  [name] MELATONIN PO Take 1 tablet by mouth at bedtime.  . Omega-3 Fatty Acids (FISH OIL) 1200 MG CAPS Take 1,200 mg by mouth 2 (two) times daily.   [name] omeprazole (PRILOSEC) 20 MG capsule Take 20 mg by mouth daily.  . vitamin C (ASCORBIC ACID) 500 MG tablet Take 500 mg by mouth daily.  . [DISCONTINUED] furosemide (LASIX) 20 MG tablet Take 1-2 tablets (20-40 mg total) by mouth daily as needed for edema. For up to 3-7 days at a time. If need 40mg , can take 2 at once or 1 twice a day. (Patient not taking: Reported on 03/02/2019)  . [DISCONTINUED] meloxicam (MOBIC) 15 MG tablet TAKE 1 TABLET (15 MG TOTAL) BY MOUTH DAILY AS NEEDED (MODERATE BACK PAIN). (Patient not taking: Reported on 03/02/2019)   No facility-administered encounter medications on file as of 03/02/2019.    Activities of  Daily Living In your present state of health, do you have any difficulty performing the following activities: 03/02/2019 11/06/2018  Hearing? Y -  Comment hearing aids, doesnt wear them -  Vision? N -  Comment eyeglasses, Dr.Woodard -  Difficulty concentrating or making decisions? Y -  Walking or climbing stairs? Y -  Dressing or bathing? N -  Doing errands, shopping? N N  Comment wife helps -  Conservation officer, nature and eating ? N -  Using the Toilet? N -  In the past six months, have you accidently leaked urine? N -  Do you have problems with loss of bowel control? N -  Managing your Medications? Dianna Rossetti, daughter helps -  Managing your Finances? Dianna Rossetti, daughter helps -  Housekeeping or managing your Housekeeping? Y -  Comment has help every other week -  Some recent data might be hidden    Patient Care Team: [name], DO as PCP - General (Family Medicine) Greg Cutter, LCSW as Social Worker (Licensed Clinical Social Worker) [place], Virl Diamond, [place] as Pharmacist Vanita Ingles, RN as Case Manager   Assessment:   This is a routine wellness examination for [place].  Exercise Activities and Dietary recommendations Current Exercise Habits: Home exercise routine, Type of exercise: walking, Time (Minutes): 30, Frequency (Times/Week): 3, Weekly Exercise (Minutes/Week): 90, Intensity: Mild, Exercise limited by: None identified  Goals    . "I'm having a hard time managing my health" (pt-stated)     Current Barriers:  . Financial constraints . Level of care concerns . ADL IADL limitations . Social Isolation . Limited access to caregiver . Memory Deficits . Lacks knowledge of community resource: personal care service resources within the area that may benefit patient  Clinical Social Work Clinical Goal(s):  [name] Over the next 90 days, client will work with SW to address concerns related to lack of care/support within the  home  Interventions: . Patient interviewed and appropriate assessments performed . Provided patient and family with information about personal care service resources within the area. Patient reports that he lives with his spouse and daughter and that his daughter is his main support and assist him with transportation/meal prep/daily needs.  . Discussed plans with patient for ongoing care management follow up and provided patient with direct contact information for care management team . Advised patient to contact CCM team with any urgent concerns . Assisted patient/caregiver with obtaining information about health plan benefits . Provided education and assistance to client regarding Advanced Directives. . Provided education to patient/caregiver regarding level of care options. [name] LCSW provided self-care education. Patient has ongoing issue with memory and sleep. Patient was encouraged to take medications as prescribed and to ask for help (daughter or spouse) when needed and to try to check blood pressure and record it once a day per PCP's recommendation.  . Patient completed a neurologist appointment on 11/23.  [name] Daughter/patient reports pt is still taking melatonin dose of 10mg  per night but still struggles with insomnia. . Daughter/patient declined needing MOW's at this time.  . Patient admits to feeling lonely at times due to isolation related to COVID-19. Emotional support and coping skill education provided. Patient was very receptive to this information.  Patient Self Care Activities:  . Attends all scheduled provider appointments . Calls provider office for new concerns or questions  Please see past updates related to this goal by clicking on the "Past Updates" button in the selected goal      . DIET - INCREASE WATER INTAKE     Recommend drinking at least 4-5 glasses of water a day     . PharmD - Medication Management     Current Barriers:  . Cognitive Deficits  Pharmacist  Clinical Goal(s):  [name] Over the next 30 days, patient/caregiver will work with CM Pharmacist to address needs related to medication management  Interventions: . Perform chart review. Note patient seen by  PCP on 1/15 for knee pain . Follow up with caregiver regarding patient's knee pain ? Caregiver reports patient has been taking meloxicam as directed with food ? Reports has not yet obtained diclofenac gel, but plans to pick up from pharmacy ? Verbalizes understanding of administration instructions for patient to try, using as an alternative to using oral meloxicam when needed for pain . Follow up regarding direction from Urologist to discontinue tamsulosin ? Caregiver confirms discontinued tamsulosin as directed by Urology . Follow up regarding blood pressure monitoring and control ? Reports patient taking: enalapril 20 mg twice daily ? Reports last checked on 1/11: 143/83, HR 69; 1/9: 135/83, HR 66 ? Encourage to aid patient with continuing to check, keep log and contact office for readings outside of provided parameters or new symptoms  Patient Self Care Activities:  . Patient to take medications as directed with assistance of daughter o Daughter aids Mr. Fama with filling his weekly pillbox and monitoring for adherence . Attends all scheduled provider appointments . Calls pharmacy for medication refills . Calls provider office for new concerns or questions . Patient/daughter to keep log of BP readings   Please see past updates related to this goal by clicking on the "Past Updates" button in the selected goal      . [name]'s memory is failing (pt-stated)     Current Barriers:  [name] Knowledge Deficits related to falls prevention, decline in memory . Cognitive Deficits . Memory issues  Nurse Case Manager Clinical Goal(s):  [name] Over the next 90 days, patient will work with [facility]  to address needs related to falls prevention and memory improvement  Interventions:  . Provided verbal  education re: Potential causes of falls and Fall prevention strategies . Assessed for falls since last encounter.-None patient compliant using cane Daughter confirms not new falls recently . Assessed patients knowledge of fall risk prevention secondary to previously provided education. Review of safety in the home  . Evaluation of current treatment plan related to dementia and patient's adherence to plan as established by provider. [name] with PCP and CCM Pharm D  regarding patient's symptoms and daughter's concerns. Both advised to request patient stop Hydroclorathiazide and continue to monitor blood pressure.  The patient remains off of the Hydrochlorothiazide  . Discussed plans with patient for ongoing care management follow up and provided patient with direct contact information for care management team . Reviewed episodes of dizziness and light headedness, per the daughter this has gotten a lot better since not taking HCTZ . She stated he dad does check his blood pressures daily, his blood pressure has been stable  . BP Readings from Last 3 Encounters: .  01/22/19 . 121/75 .  12/21/18 . 136/78 .  11/09/18 . 114/74       Patient Self Care Activities:  . [name] (assistive device) appropriately with all ambulation . De-clutter walkways . Change positions slowly . Wear secure fitting shoes at all times with ambulation . Utilize home lighting for dim lit areas . Have self and pet awareness at all times   Patient Self Care Activities:  . Currently UNABLE TO independently prevent falls  . Attends all scheduled provider appointments . Performs ADL's independently  Please see past updates related to this goal by clicking on the "Past Updates" button in the selected goal      . RNCM: Recently Dad has had swelling in both of his legs and feet     Current Barriers:  [name] Knowledge  Deficits related to management of HTN as evidence of swelling in feet and legs, not always being  able to eat a heart healthy diet . Cognitive Deficits  Nurse Case Manager Clinical Goal(s):  [name] Over the next 90 days, patient will verbalize understanding of plan for managing HTN and HLD . Over the next 90 days, patient will work with [facility] [place] pharmacist and pcp to address needs related to managing HTN and increased swelling in feet and legs . Over the next 90 days, patient will demonstrate a decrease in bilateral lower extremity edema exacerbations as evidenced by normalized blood pressures and no evidence of edema present in bilateral lower legs . Over the next 90 days, patient will demonstrate improved adherence to prescribed treatment plan for wearing knee high ted hose as evidenced bydecreased bilateral lower leg edema . Over the next 90 days, patient will work with CM team pharmacist to review medications as needed and take Lasix as prescribed by pcp . Over the next 90 days, the patient will demonstrate ongoing self health care management ability as evidenced by elevating legs when sitting, taking meds as prescribed, following a heart healthy diet, and wearing ted hose to reduce bilateral lower leg edema.  Interventions:  . Evaluation of current treatment plan related to HTN and HLD and patient's adherence to plan as established by provider. . Provided education to patient re: best way to put on ted hose with the use of a plastic grocery bag for ease of putting on hose. Also alternative compression socks" found on [place] or at [place] that my be beneficial for the patient . Reviewed medications with patient and discussed the use of Lasix. The daughter verbalized the patient did not have any Lasix.  [name] Collaborated with pcp regarding Lasix RX. Dr. Parks Ranger to send in script for Lasix to use short term . Call back to the patients  daughter [name] and discussed Lasix use and parameters set by Dr. Parks Ranger . Discussed plans with patient for ongoing care management follow up and provided  patient with direct contact information for care management team . Advised patient, providing education and rationale, to monitor blood pressure daily and record, calling pcp for findings outside established parameters.  . Provided patient with heart healthy  educational materials related to adhering to heart healthy diet  . Pharmacy referral for medication change follow up and support . Educated daughter on the [place] functionality to view notes/lab work and communicate with the pcp team  Patient Self Care Activities:  . Patient verbalizes understanding of plan to use Lasix prn for swelling in feet and legs per MD recommendations . Attends all scheduled provider appointments . Calls provider office for new concerns or questions . Unable to independently manage bilateral lower leg edema  Initial goal documentation        Fall Risk: Fall Risk  03/02/2019 01/22/2019 09/16/2018 07/13/2018 06/17/2018  Falls in the past year? 0 0 0 1 0  Number falls in past yr: 0 0 - 1 -  Injury with Fall? 0 0 - 1 -  Risk for fall due to : - - - History of fall(s);Impaired balance/gait -  Risk for fall due to: Comment - - - Shuffles feet -  Follow up - Falls evaluation completed Falls evaluation completed Education provided;Falls prevention discussed Falls evaluation completed    FALL RISK PREVENTION PERTAINING TO THE HOME:  Any stairs in or around the home? Yes  If so, are there any without handrails? No  Home free of loose throw rugs in walkways, pet beds, electrical cords, etc? Yes  Adequate lighting in your home to reduce risk of falls? Yes   ASSISTIVE DEVICES UTILIZED TO PREVENT FALLS:  Life alert? No  Use of a cane, walker or w/c? Yes  cane when outside  Grab bars in the bathroom? Yes  Shower chair or bench in shower? Yes  Elevated toilet seat or a handicapped toilet? Yes   TIMED UP AND GO:  Unable to perform   Depression Screen PHQ 2/9 Scores 03/02/2019 01/22/2019 09/21/2018 09/16/2018  PHQ -  2 Score 0 0 0 0    Cognitive Function     6CIT Screen 02/24/2018 11/26/2016  What Year? 0 points 0 points  What month? 0 points 0 points  What time? 0 points 0 points  Count back from 20 0 points 0 points  Months in reverse 0 points 2 points  Repeat phrase 2 points 6 points  Total Score 2 8    Immunization History  Administered Date(s) Administered  . Fluad Quad(high Dose 65+) 11/07/2018  . Influenza, High Dose Seasonal PF 10/01/2016, 10/07/2017  . Pneumococcal Conjugate-13 09/11/2016  . Pneumococcal Polysaccharide-23 10/31/2017  . Tdap 03/05/2016    Qualifies for Shingles Vaccine? Yes  Zostavax completed n/a. Due for Shingrix. Education has been provided regarding the importance of this vaccine. Pt has been advised to call insurance company to determine out of pocket expense. Advised may also receive vaccine at local pharmacy or Health Dept. Verbalized acceptance and understanding.  Tdap: up to date   Flu Vaccine: up to date   Pneumococcal Vaccine: up to date .   Covid-19: information provided   Screening Tests Health Maintenance  Topic Date Due  . TETANUS/TDAP  03/05/2026  . INFLUENZA VACCINE  Completed  . PNA vac Low Risk Adult  Completed   Cancer Screenings:  Colorectal Screening: no longer required  Lung Cancer Screening: (Low Dose CT Chest recommended if Age 65-80 years, 30 pack-year currently smoking OR have quit w/in 15years.) does not qualify.    Additional Screening:  Hepatitis C Screening: does not qualify  Vision Screening: Recommended annual ophthalmology exams for early detection of glaucoma and other disorders of the eye. Is the patient up to date with their annual eye exam?  Yes  Who is the provider or what is the name of the office in which the pt attends annual eye exams?   Dental Screening: Recommended annual dental exams for proper oral hygiene  Community Resource Referral:  CRR required this visit?  No        Plan:  I have  personally reviewed and addressed the Medicare Annual Wellness questionnaire and have noted the following in the patient's chart:  A. Medical and social history B. Use of alcohol, tobacco or illicit drugs  C. Current medications and supplements D. Functional ability and status E.  Nutritional status F.  Physical activity G. Advance directives H. List of other physicians I.  Hospitalizations, surgeries, and ER visits in previous 12 months J.  [place] such as hearing and vision if needed, cognitive and depression L. Referrals and appointments   In addition, I have reviewed and discussed with patient certain preventive protocols, quality metrics, and best practice recommendations. A written personalized care plan for preventive services as well as general preventive health recommendations were provided to patient.   Signed,   Bevelyn Ngo, LPN  [id number] Nurse Health Advisor   Nurse Notes: none

## 2019-03-02 NOTE — Patient Instructions (Addendum)
Randy [name] , Thank you for taking time to come for your Medicare Wellness Visit. I appreciate your ongoing commitment to your health goals. Please review the following plan we discussed and let me know if I can assist you in the future.   Screening recommendations/referrals: Colonoscopy: no longer required Recommended yearly ophthalmology/optometry visit for glaucoma screening and checkup Recommended yearly dental visit for hygiene and checkup  Vaccinations: Influenza vaccine: up to date  Pneumococcal vaccine: up to date Tdap vaccine: up to date Shingles vaccine: shingrix eligible     Advanced directives: Please bring a copy of your health care power of attorney and living will to the office at your convenience.  Conditions/risks identified: fall preventions discussed below   Next appointment: Follow up in one year for your annual wellness visit   Preventive Care 65 Years and Older, Male Preventive care refers to lifestyle choices and visits with your health care provider that can promote health and wellness. What does preventive care include?ok   A yearly physical exam. This is also called an annual well check.  Dental exams once or twice a year.  Routine eye exams. Ask your health care provider how often you should have your eyes checked.  Personal lifestyle choices, including:  Daily care of your teeth and gums.  Regular physical activity.  Eating a healthy diet.  Avoiding tobacco and drug use.  Limiting alcohol use.  Practicing safe sex.  Taking low doses of aspirin every day.  Taking vitamin and mineral supplements as recommended by your health care provider. What happens during an annual well check? The services and screenings done by your health care provider during your annual well check will depend on your age, overall health, lifestyle risk factors, and family history of disease. Counseling  Your health care provider may ask you questions about  your:  Alcohol use.  Tobacco use.  Drug use.  Emotional well-being.  Home and relationship well-being.  Sexual activity.  Eating habits.  History of falls.  Memory and ability to understand (cognition).  Work and work Statistician. Screening  You may have the following tests or measurements:  Height, weight, and BMI.  Blood pressure.  Lipid and cholesterol levels. These may be checked every 5 years, or more frequently if you are over 65 years old.  Skin check.  Lung cancer screening. You may have this screening every year starting at age 28 if you have a 30-pack-year history of smoking and currently smoke or have quit within the past 15 years.  Fecal occult blood test (FOBT) of the stool. You may have this test every year starting at age 34.  Flexible sigmoidoscopy or colonoscopy. You may have a sigmoidoscopy every 5 years or a colonoscopy every 10 years starting at age 67.  Prostate cancer screening. Recommendations will vary depending on your family history and other risks.  Hepatitis C blood test.  Hepatitis B blood test.  Sexually transmitted disease (STD) testing.  Diabetes screening. This is done by checking your blood sugar (glucose) after you have not eaten for a while (fasting). You may have this done every 1-3 years.  Abdominal aortic aneurysm (AAA) screening. You may need this if you are a current or former smoker.  Osteoporosis. You may be screened starting at age 69 if you are at high risk. Talk with your health care provider about your test results, treatment options, and if necessary, the need for more tests. Vaccines  Your health care provider may recommend certain vaccines, such  as:  Influenza vaccine. This is recommended every year.  Tetanus, diphtheria, and acellular pertussis (Tdap, Td) vaccine. You may need a Td booster every 10 years.  Zoster vaccine. You may need this after age 58.  Pneumococcal 13-valent conjugate (PCV13) vaccine.  One dose is recommended after age 48.  Pneumococcal polysaccharide (PPSV23) vaccine. One dose is recommended after age 52. Talk to your health care provider about which screenings and vaccines you need and how often you need them. This information is not intended to replace advice given to you by your health care provider. Make sure you discuss any questions you have with your health care provider. Document Released: 01/20/2015 Document Revised: 09/13/2015 Document Reviewed: 10/25/2014 Elsevier Interactive Patient Education  2017 [place] Prevention in the Home Falls can cause injuries. They can happen to people of all ages. There are many things you can do to make your home safe and to help prevent falls. What can I do on the outside of my home?  Regularly fix the edges of walkways and driveways and fix any cracks.  Remove anything that might make you trip as you walk through a door, such as a raised step or threshold.  Trim any bushes or trees on the path to your home.  Use bright outdoor lighting.  Clear any walking paths of anything that might make someone trip, such as rocks or tools.  Regularly check to see if handrails are loose or broken. Make sure that both sides of any steps have handrails.  Any raised decks and porches should have guardrails on the edges.  Have any leaves, snow, or ice cleared regularly.  Use sand or salt on walking paths during winter.  Clean up any spills in your garage right away. This includes oil or grease spills. What can I do in the bathroom?  Use night lights.  Install grab bars by the toilet and in the tub and shower. Do not use towel bars as grab bars.  Use non-skid mats or decals in the tub or shower.  If you need to sit down in the shower, use a plastic, non-slip stool.  Keep the floor dry. Clean up any water that spills on the floor as soon as it happens.  Remove soap buildup in the tub or shower regularly.  Attach bath  mats securely with double-sided non-slip rug tape.  Do not have throw rugs and other things on the floor that can make you trip. What can I do in the bedroom?  Use night lights.  Make sure that you have a light by your bed that is easy to reach.  Do not use any sheets or blankets that are too big for your bed. They should not hang down onto the floor.  Have a firm chair that has side arms. You can use this for support while you get dressed.  Do not have throw rugs and other things on the floor that can make you trip. What can I do in the kitchen?  Clean up any spills right away.  Avoid walking on wet floors.  Keep items that you use a lot in easy-to-reach places.  If you need to reach something above you, use a strong step stool that has a grab bar.  Keep electrical cords out of the way.  Do not use floor polish or wax that makes floors slippery. If you must use wax, use non-skid floor wax.  Do not have throw rugs and other things on the  floor that can make you trip. What can I do with my stairs?  Do not leave any items on the stairs.  Make sure that there are handrails on both sides of the stairs and use them. Fix handrails that are broken or loose. Make sure that handrails are as long as the stairways.  Check any carpeting to make sure that it is firmly attached to the stairs. Fix any carpet that is loose or worn.  Avoid having throw rugs at the top or bottom of the stairs. If you do have throw rugs, attach them to the floor with carpet tape.  Make sure that you have a light switch at the top of the stairs and the bottom of the stairs. If you do not have them, ask someone to add them for you. What else can I do to help prevent falls?  Wear shoes that:  Do not have high heels.  Have rubber bottoms.  Are comfortable and fit you well.  Are closed at the toe. Do not wear sandals.  If you use a stepladder:  Make sure that it is fully opened. Do not climb a closed  stepladder.  Make sure that both sides of the stepladder are locked into place.  Ask someone to hold it for you, if possible.  Clearly mark and make sure that you can see:  Any grab bars or handrails.  First and last steps.  Where the edge of each step is.  Use tools that help you move around (mobility aids) if they are needed. These include:  Canes.  Walkers.  Scooters.  Crutches.  Turn on the lights when you go into a dark area. Replace any light bulbs as soon as they burn out.  Set up your furniture so you have a clear path. Avoid moving your furniture around.  If any of your floors are uneven, fix them.  If there are any pets around you, be aware of where they are.  Review your medicines with your doctor. Some medicines can make you feel dizzy. This can increase your chance of falling. Ask your doctor what other things that you can do to help prevent falls. This information is not intended to replace advice given to you by your health care provider. Make sure you discuss any questions you have with your health care provider. Document Released: 10/20/2008 Document Revised: 06/01/2015 Document Reviewed: 01/28/2014 Elsevier Interactive Patient Education  2017 [employer].

## 2019-03-04 ENCOUNTER — Ambulatory Visit: Payer: Self-pay | Admitting: Pharmacist

## 2019-03-04 DIAGNOSIS — M47816 Spondylosis without myelopathy or radiculopathy, lumbar region: Secondary | ICD-10-CM | POA: Diagnosis not present

## 2019-03-04 DIAGNOSIS — M810 Age-related osteoporosis without current pathological fracture: Secondary | ICD-10-CM | POA: Diagnosis not present

## 2019-03-04 DIAGNOSIS — E782 Mixed hyperlipidemia: Secondary | ICD-10-CM | POA: Diagnosis not present

## 2019-03-04 DIAGNOSIS — I10 Essential (primary) hypertension: Secondary | ICD-10-CM | POA: Diagnosis not present

## 2019-03-05 NOTE — Chronic Care Management (AMB) (Signed)
Chronic Care Management   Follow Up Note   03/05/2019 Name: Randy [name] MRN: [id number] DOB: 19-Dec-1934  Referred by: Randy Hauser, DO Reason for referral : Chronic Care Management (Caregiver Phone Call)   Randy [name] is a 85 y.o. year old male who is a primary care patient of Randy Hauser, DO. The CCM team was consulted for assistance with chronic disease management and care coordination needs.  Randy [name] has a past medical history including, but not limited to: hypertension, osteoarthritis, hyperlipidemia, insomnia and memory loss.  I reached out to patient's daughter/caregiver, [name], by phone today.   Review of patient status, including review of consultants reports, relevant laboratory and other test results, and collaboration with appropriate care team members and the patient's provider was performed as part of comprehensive patient evaluation and provision of chronic care management services.     Outpatient Encounter Medications as of 03/04/2019  Medication Sig  . cholecalciferol (VITAMIN D) 1000 units tablet Take 1,000 Units by mouth daily.  . Coenzyme Q10 (CO Q 10) 100 MG CAPS Take 1 capsule by mouth daily.   . diclofenac Sodium (VOLTAREN) 1 % GEL Apply 2 g topically 3 (three) times daily as needed. To knee arthritis pain  . enalapril (VASOTEC) 20 MG tablet TAKE 1 TABLET BY MOUTH TWICE A DAY  . fluticasone (FLONASE) 50 MCG/ACT nasal spray Place 2 sprays into both nostrils daily as needed for allergies.   [name] MELATONIN PO Take 1 tablet by mouth at bedtime.  . Omega-3 Fatty Acids (FISH OIL) 1200 MG CAPS Take 1,200 mg by mouth 2 (two) times daily.   [name] omeprazole (PRILOSEC) 20 MG capsule Take 20 mg by mouth daily.  . vitamin C (ASCORBIC ACID) 500 MG tablet Take 500 mg by mouth daily.  . [DISCONTINUED] hydrochlorothiazide (MICROZIDE) 12.5 MG capsule Take 12.5 mg by mouth daily.   No facility-administered encounter medications on file as of 03/04/2019.     Goals Addressed            This Visit's Progress   . PharmD - Medication Management       Current Barriers:  . Cognitive Deficits  Pharmacist Clinical Goal(s):  [name] Over the next 30 days, patient/caregiver will work with CM Pharmacist to address needs related to medication management  Interventions: . Perform chart review. Note on 2/15 PCP sent new rx for furosemide 20 mg (Take 1-2 tablets (20-40 mg total) by mouth daily as needed for edema. For up to 3-7 days at a time. If need 40mg , can take 2 at once or 1 twice a day)  ? Note daughter called clinic on 2/22 and received further guidance about patient's furosemide.  . Follow up with daughter regarding patient's edema ? Reports swelling in patient's legs has improved; denies noticing blueness recently ? Reports patient has continued furosemide 20 mg - 1 tablet daily.  ? Review instruction from furosemide prescription with daughter. Note now >7 days since started. ? States needs to take a look at patient's legs again when she gets home.  ? Encourage daughter to let PCP know if swelling not resolved or further concerns ? Reiterate instruction from provider for s/s in toes requiring patient to be seen by Urgent care or ED . Follow up regarding blood pressure monitoring and control ? Reports patient taking: enalapril 20 mg twice daily ? Denies patient recently monitoring BP ? Encourage to aid patient with restarting to check BP, keep log and contact office for  readings outside of provided parameters or new symptoms ? Will f/u next week as requested  Patient Self Care Activities:  . Patient to take medications as directed with assistance of daughter o Daughter aids [name] with filling his weekly pillbox and monitoring for adherence . Attends all scheduled provider appointments . Calls pharmacy for medication refills . Calls provider office for new concerns or questions . Patient/daughter to keep log of BP readings   Please see  past updates related to this goal by clicking on the "Past Updates" button in the selected goal         Plan  Telephone follow up appointment with care management team member scheduled for: 3/1 at 3 pm   [name], PharmD, [place] [phone]

## 2019-03-05 NOTE — Patient Instructions (Signed)
Thank you allowing the Chronic Care Management Team to be a part of your care! It was a pleasure speaking with you today!     CCM (Chronic Care Management) Team    Noreene Larsson RN, MSN, CCM Nurse Care Coordinator  [phone]   [name] PharmD  Clinical Pharmacist  [phone]   Eula Fried LCSW Clinical Social Worker [phone]  Visit Information  Goals Addressed            This Visit's Progress   . PharmD - Medication Management       Current Barriers:  . Cognitive Deficits  Pharmacist Clinical Goal(s):  [name] Over the next 30 days, patient/caregiver will work with CM Pharmacist to address needs related to medication management  Interventions: . Perform chart review. Note on 2/15 PCP sent new rx for furosemide 20 mg (Take 1-2 tablets (20-40 mg total) by mouth daily as needed for edema. For up to 3-7 days at a time. If need 40mg , can take 2 at once or 1 twice a day)  ? Note daughter called clinic on 2/22 and received further guidance about patient's furosemide.  . Follow up with daughter regarding patient's edema ? Reports swelling in patient's legs has improved; denies noticing blueness recently ? Reports patient has continued furosemide 20 mg - 1 tablet daily.  ? Review instruction from furosemide prescription with daughter. Note now >7 days since started. ? States needs to take a look at patient's legs again when she gets home.  ? Encourage daughter to let PCP know if swelling not resolved or further concerns ? Reiterate instruction from provider for s/s in toes requiring patient to be seen by Urgent care or ED . Follow up regarding blood pressure monitoring and control ? Reports patient taking: enalapril 20 mg twice daily ? Denies patient recently monitoring BP ? Encourage to aid patient with restarting to check BP, keep log and contact office for readings outside of provided parameters or new symptoms ? Will f/u next week as requested  Patient Self Care  Activities:  . Patient to take medications as directed with assistance of daughter o Daughter aids Mr. Brownridge with filling his weekly pillbox and monitoring for adherence . Attends all scheduled provider appointments . Calls pharmacy for medication refills . Calls provider office for new concerns or questions . Patient/daughter to keep log of BP readings   Please see past updates related to this goal by clicking on the "Past Updates" button in the selected goal         Patient verbalizes understanding of instructions provided today.   Telephone follow up appointment with care management team member scheduled for: 3/1 at 3 pm   [name], PharmD, [place] [phone]

## 2019-03-08 ENCOUNTER — Ambulatory Visit: Payer: Self-pay | Admitting: Pharmacist

## 2019-03-08 ENCOUNTER — Telehealth: Payer: Self-pay | Admitting: Family Medicine

## 2019-03-08 DIAGNOSIS — I10 Essential (primary) hypertension: Secondary | ICD-10-CM

## 2019-03-08 NOTE — Patient Instructions (Signed)
Thank you allowing the Chronic Care Management Team to be a part of your care! It was a pleasure speaking with you today!     CCM (Chronic Care Management) Team    Noreene Larsson RN, MSN, CCM Nurse Care Coordinator  [phone]   [name] PharmD  Clinical Pharmacist  [phone]   Eula Fried LCSW Clinical Social Worker [phone]  Visit Information  Goals Addressed            This Visit's Progress   . PharmD - Medication Management       Current Barriers:    . Cognitive Deficits  Pharmacist Clinical Goal(s):  [name] Over the next 30 days, patient/caregiver will work with CM Pharmacist to address needs related to medication management  Interventions: . Reschedule appointment to discuss BP as requested by daughter. Reports forgot log of BP results today. Haywood Pao about patient's leg swelling ? Daughter reports monitoring patient's legs more closely over past few days and feels like he still has a little swelling in legs, but closer to his norm.  ? Reports has been continuing to give patient furosemide 20 mg once daily as she is concerned about swelling increasing if stopped . Encourage daughter to follow up with PCP office today for further guidance on whether patients need to be seen in office . Will collaborate with PCP  Patient Self Care Activities:  . Patient to take medications as directed with assistance of daughter o Daughter aids Mr. Odonald with filling his weekly pillbox and monitoring for adherence . Attends all scheduled provider appointments . Calls pharmacy for medication refills . Calls provider office for new concerns or questions . Patient/daughter to keep log of BP readings   Please see past updates related to this goal by clicking on the "Past Updates" button in the selected goal         Patient verbalizes understanding of instructions provided today.   Telephone follow up appointment with care management team member scheduled for: 3/10  at 9 am  [name], PharmD, [place] [phone]

## 2019-03-08 NOTE — Chronic Care Management (AMB) (Signed)
Chronic Care Management   Follow Up Note   03/08/2019 Name: LEIGHLAND MISFELDT MRN: [id number] DOB: Feb 14, 1934  Referred by: [name], DO Reason for referral : Chronic Care Management (Caregiver Phone Call)   HELAMAN KERRICK is a 85 y.o. year old male who is a primary care patient of [name], DO. The CCM team was consulted for assistance with chronic disease management and care coordination needs. Mr. Mazar has a past medical history including, but not limited to: hypertension, osteoarthritis, hyperlipidemia, insomnia and memory loss.  I reached out to patient's daughter/caregiver, [name], by phone today.   Review of patient status, including review of consultants reports, relevant laboratory and other test results, and collaboration with appropriate care team members and the patient's provider was performed as part of comprehensive patient evaluation and provision of chronic care management services.      Outpatient Encounter Medications as of 03/08/2019  Medication Sig  . furosemide (LASIX) 20 MG tablet Take 20 mg by mouth daily.  . carbidopa-levodopa (SINEMET IR) 25-100 MG tablet Take 1 tablet by mouth 3 (three) times daily.  . cholecalciferol (VITAMIN D) 1000 units tablet Take 1,000 Units by mouth daily.  . Coenzyme Q10 (CO Q 10) 100 MG CAPS Take 1 capsule by mouth daily.   . diclofenac Sodium (VOLTAREN) 1 % GEL Apply 2 g topically 3 (three) times daily as needed. To knee arthritis pain  . enalapril (VASOTEC) 20 MG tablet TAKE 1 TABLET BY MOUTH TWICE A DAY  . fluticasone (FLONASE) 50 MCG/ACT nasal spray Place 2 sprays into both nostrils daily as needed for allergies.   [name] MELATONIN PO Take 1 tablet by mouth at bedtime.  . Omega-3 Fatty Acids (FISH OIL) 1200 MG CAPS Take 1,200 mg by mouth 2 (two) times daily.   [name] omeprazole (PRILOSEC) 20 MG capsule Take 20 mg by mouth daily.  . vitamin C (ASCORBIC ACID) 500 MG tablet Take 500 mg by mouth daily.   No  facility-administered encounter medications on file as of 03/08/2019.    Goals Addressed            This Visit's Progress   . PharmD - Medication Management       Current Barriers:    . Cognitive Deficits  Pharmacist Clinical Goal(s):  [name] Over the next 30 days, patient/caregiver will work with CM Pharmacist to address needs related to medication management  Interventions: . Reschedule appointment to discuss BP as requested by daughter. Reports forgot log of BP results today. Haywood Pao about patient's leg swelling ? Daughter reports monitoring patient's legs more closely over past few days and feels like he still has a little swelling in legs, but closer to his norm.  ? Reports has been continuing to give patient furosemide 20 mg once daily as she is concerned about swelling increasing if stopped . Encourage daughter to follow up with PCP office today for further guidance on whether patients need to be seen in office . Will collaborate with PCP  Patient Self Care Activities:  . Patient to take medications as directed with assistance of daughter o Daughter aids Mr. Arellanes with filling his weekly pillbox and monitoring for adherence . Attends all scheduled provider appointments . Calls pharmacy for medication refills . Calls provider office for new concerns or questions . Patient/daughter to keep log of BP readings   Please see past updates related to this goal by clicking on the "Past Updates" button in the selected goal  Plan  Telephone follow up appointment with care management team member scheduled for: 3/10 at 9 am  [name], PharmD, [place] [phone]

## 2019-03-08 NOTE — Telephone Encounter (Signed)
Called patient's daughter Randy [name] and reviewed recent questions on Furosemide with her (I was notified by Randy [name] pharmacy by this concern after call with them earlier today)  Spoke with Randy [name], sounds like the furosemide 20mg  daily is significantly helping his LE edema. But they are concerned if stop it, swelling will return. I originally advised short term rx only.  He has had stable Cr in past 0.9 and is tolerating low dose lasix.  I advised he may continue it longer term, lasix 20mg  daily in AM. However I did ask that she may try still to give it intermittently. If he can skip a day or two here and there that is fine. Goal is to avoid excessive dose of furosemide to avoid any harm to his kidney function.  She may try alternating every other day for a week and see how he does.  Basically, if he skips a dose, can still take it later that day if needed.  Ultimately have to base his dosing off of his symptoms.  If he takes it regularly for another 1 month, I requested that they come in for BMET blood draw, no apt needed unless new concerns or questions.  We will anticipate full blood panel in 06/2019 as usual can repeat chemistry as well.  She has enough rx for Randy [name] for now.  Questions answered  Randy Putnam, DO [facility] 03/08/2019, 6:09 PM

## 2019-03-15 ENCOUNTER — Telehealth: Payer: Self-pay

## 2019-03-16 ENCOUNTER — Ambulatory Visit: Payer: Self-pay

## 2019-03-16 NOTE — Chronic Care Management (AMB) (Signed)
  Care Management   Follow Up Note   03/16/2019 Name: Randy [name] MRN: [id number] DOB: 05/12/1934  Referred by: [name], DO Reason for referral : Care Coordination   Randy [name] is a 85 y.o. year old male who is a primary care patient of [name], DO. The care management team was consulted for assistance with care management and care coordination needs.    Review of patient status, including review of consultants reports, relevant laboratory and other test results, and collaboration with appropriate care team members and the patient's provider was performed as part of comprehensive patient evaluation and provision of chronic care management services.    LCSW completed CCM outreach attempt today but was unable to reach patient successfully. A HIPPA compliant voice message was left encouraging patient to return call once available. LCSW rescheduled CCM SW appointment as well.  A HIPPA compliant phone message was left for the patient providing contact information and requesting a return call.   Eula Fried, BSW, MSW, [email or web address] Phone: [phone]

## 2019-03-17 ENCOUNTER — Telehealth: Payer: Self-pay | Admitting: Family Medicine

## 2019-03-17 ENCOUNTER — Ambulatory Visit (INDEPENDENT_AMBULATORY_CARE_PROVIDER_SITE_OTHER): Payer: Medicare Other | Admitting: Pharmacist

## 2019-03-17 ENCOUNTER — Other Ambulatory Visit: Payer: Self-pay | Admitting: Family Medicine

## 2019-03-17 DIAGNOSIS — M25562 Pain in left knee: Secondary | ICD-10-CM

## 2019-03-17 DIAGNOSIS — I10 Essential (primary) hypertension: Secondary | ICD-10-CM

## 2019-03-17 DIAGNOSIS — R600 Localized edema: Secondary | ICD-10-CM

## 2019-03-17 DIAGNOSIS — G8929 Other chronic pain: Secondary | ICD-10-CM

## 2019-03-17 DIAGNOSIS — M25561 Pain in right knee: Secondary | ICD-10-CM

## 2019-03-17 DIAGNOSIS — E782 Mixed hyperlipidemia: Secondary | ICD-10-CM | POA: Diagnosis not present

## 2019-03-17 DIAGNOSIS — R413 Other amnesia: Secondary | ICD-10-CM

## 2019-03-17 NOTE — Patient Instructions (Signed)
Thank you allowing the Chronic Care Management Team to be a part of your care! It was a pleasure speaking with you today!     CCM (Chronic Care Management) Team    Noreene Larsson RN, MSN, CCM Nurse Care Coordinator  [phone]   [name] PharmD  Clinical Pharmacist  [phone]   Eula Fried LCSW Clinical Social Worker [phone]  Visit Information  Goals Addressed            This Visit's Progress   . PharmD - Medication Management       CARE PLAN ENTRY (see longitudinal plan of care for additional care plan information)  Current Barriers:    . Cognitive Deficits  Pharmacist Clinical Goal(s):  [name] Over the next 30 days, patient/caregiver will work with CM Pharmacist to address needs related to medication management  Interventions: . Collaborated with PCP regarding patient's leg swelling . Received message back from PCP in chart. Provider called daughter, [name], and advised patient may continue furosemide 20 mg QAM longer, but caregiver to try to give intermittently if patient able to tolerate. Patient to come in for BMET in 1 month if continues to take regularly. Haywood Pao about patient's leg swelling ? Daughter reports that patient has done well with taking furosemide 20 mg EVERY OTHER day ? Reports patient still has a little swelling in legs, but stable . Remind daughter of importance of patient coming in for BMET blood draw ~ 4/1 . Follow up regarding blood pressure monitoring and control ? Reports patient taking:  ? enalapril 20 mg twice daily ? Furosemide 20 mg every other day ? Reports BP readings: 120-130s/70s ? Encourage to aid patient with continuing to check, keep log and contact office for readings outside of provided parameters or new symptoms . Inquire about patient's knee pain ? Daughter reports continues to be a chronic problem for patient ? Reports patient using diclofenac gel as directed ? Reports patient has been trying to increase his  walking, currently ~15 minutes/day, weather permitting . Will collaborate with PCP regarding BMET blood work as patient continuing to take furosemide regularly  Patient Self Care Activities:  . Patient to take medications as directed with assistance of daughter o Daughter aids Mr. Ridl with filling his weekly pillbox and monitoring for adherence . Attends all scheduled provider appointments . Calls pharmacy for medication refills . Calls provider office for new concerns or questions . Patient/daughter to keep log of BP readings   Please see past updates related to this goal by clicking on the "Past Updates" button in the selected goal         Patient verbalizes understanding of instructions provided today.   The care management team will reach out to the patient again over the next 30 days.   [name], PharmD, [place] [employer] [phone]

## 2019-03-17 NOTE — Progress Notes (Signed)
Duplicate encounter opened. 

## 2019-03-17 NOTE — Telephone Encounter (Signed)
Patient is working with Randy [name].  Recent telephone conversation back on 03/08/19 with patient's daughter, Randy [name].  He is now taking Furosemide 20mg  every other day and is doing better.  He will need a BMET done by end of March 2021 or first week of April 2021.  I will place future order now.  Please notify patient that he can come in for NON-fasting lab draw or schedule around that time, last week of March 2021 or first week of April 2021, we can f/u with him on the results on phone or through [place].  Randy [name], [facility] 03/17/2019, 12:43 PM

## 2019-03-17 NOTE — Chronic Care Management (AMB) (Signed)
Chronic Care Management   Follow Up Note   03/17/2019 Name: Randy [name] MRN: [id number] DOB: 25-Apr-1934  Referred by: Randy Hauser, DO Reason for referral : Chronic Care Management (Caregiver Phone Call)   Randy [name] is a 85 y.o. year old male who is a primary care patient of Randy Hauser, DO. The CCM team was consulted for assistance with chronic disease management and care coordination needs.  Randy [name] has a past medical history including, but not limited to: hypertension, osteoarthritis, hyperlipidemia, insomnia and memory loss.  I reached out to patient's daughter/caregiver, Randy [name], by phone today.    Review of patient status, including review of consultants reports, relevant laboratory and other test results, and collaboration with appropriate care team members and the patient's provider was performed as part of comprehensive patient evaluation and provision of chronic care management services.    SDOH (Social Determinants of Health) screening performed today: Physical Activity. See Care Plan for related entries.   Outpatient Encounter Medications as of 03/17/2019  Medication Sig  . enalapril (VASOTEC) 20 MG tablet TAKE 1 TABLET BY MOUTH TWICE A DAY  . furosemide (LASIX) 20 MG tablet Take 20 mg by mouth every other day.   . carbidopa-levodopa (SINEMET IR) 25-100 MG tablet Take 1 tablet by mouth 3 (three) times daily.  . cholecalciferol (VITAMIN D) 1000 units tablet Take 1,000 Units by mouth daily.  . Coenzyme Q10 (CO Q 10) 100 MG CAPS Take 1 capsule by mouth daily.   . diclofenac Sodium (VOLTAREN) 1 % GEL Apply 2 g topically 3 (three) times daily as needed. To knee arthritis pain  . fluticasone (FLONASE) 50 MCG/ACT nasal spray Place 2 sprays into both nostrils daily as needed for allergies.   [name] MELATONIN PO Take 1 tablet by mouth at bedtime.  . Omega-3 Fatty Acids (FISH OIL) 1200 MG CAPS Take 1,200 mg by mouth 2 (two) times daily.   [name] omeprazole  (PRILOSEC) 20 MG capsule Take 20 mg by mouth daily.  . vitamin C (ASCORBIC ACID) 500 MG tablet Take 500 mg by mouth daily.   No facility-administered encounter medications on file as of 03/17/2019.    Goals Addressed            This Visit's Progress   . PharmD - Medication Management       CARE PLAN ENTRY (see longitudinal plan of care for additional care plan information)  Current Barriers:    . Cognitive Deficits  Pharmacist Clinical Goal(s):  [name] Over the next 30 days, patient/caregiver will work with CM Pharmacist to address needs related to medication management  Interventions: . Collaborated with PCP regarding patient's leg swelling . Received message back from PCP in chart. Provider called daughter, Randy [name], and advised patient may continue furosemide 20 mg QAM longer, but caregiver to try to give intermittently if patient able to tolerate. Patient to come in for BMET in 1 month if continues to take regularly. Randy [name] about patient's leg swelling ? Daughter reports that patient has done well with taking furosemide 20 mg EVERY OTHER day ? Reports patient still has a little swelling in legs, but stable . Remind daughter of importance of patient coming in for BMET blood draw ~ 4/1 . Follow up regarding blood pressure monitoring and control ? Reports patient taking:  ? enalapril 20 mg twice daily ? Furosemide 20 mg every other day ? Reports BP readings: 120-130s/70s ? Encourage to aid patient with continuing to check, keep log  and contact office for readings outside of provided parameters or new symptoms . Inquire about patient's knee pain ? Daughter reports continues to be a chronic problem for patient ? Reports patient using diclofenac gel as directed ? Reports patient has been trying to increase his walking, currently ~15 minutes/day, weather permitting . Will collaborate with PCP regarding BMET blood work as patient continuing to take furosemide regularly  Patient Self  Care Activities:  . Patient to take medications as directed with assistance of daughter o Daughter aids Randy [name] with filling his weekly pillbox and monitoring for adherence . Attends all scheduled provider appointments . Calls pharmacy for medication refills . Calls provider office for new concerns or questions . Patient/daughter to keep log of BP readings   Please see past updates related to this goal by clicking on the "Past Updates" button in the selected goal         Plan  The care management team will reach out to the patient again over the next 30 days.   [name], PharmD, [place] [employer] [phone]

## 2019-03-17 NOTE — Telephone Encounter (Signed)
Spoke to [place] and scheduled his lab only apt on 04/02/2019.

## 2019-03-23 ENCOUNTER — Telehealth: Payer: Self-pay

## 2019-03-23 ENCOUNTER — Other Ambulatory Visit: Payer: Self-pay | Admitting: Family Medicine

## 2019-03-23 DIAGNOSIS — I10 Essential (primary) hypertension: Secondary | ICD-10-CM

## 2019-04-02 ENCOUNTER — Other Ambulatory Visit: Payer: Self-pay

## 2019-04-02 ENCOUNTER — Other Ambulatory Visit: Payer: Medicare Other

## 2019-04-02 DIAGNOSIS — R600 Localized edema: Secondary | ICD-10-CM

## 2019-04-02 DIAGNOSIS — I10 Essential (primary) hypertension: Secondary | ICD-10-CM

## 2019-04-03 LAB — BASIC METABOLIC PANEL WITH GFR
BUN/Creatinine Ratio: 24 (calc) — ABNORMAL HIGH (ref 6–22)
BUN: 26 mg/dL — ABNORMAL HIGH (ref 7–25)
CO2: 25 mmol/L (ref 20–32)
Calcium: 9 mg/dL (ref 8.6–10.3)
Chloride: 107 mmol/L (ref 98–110)
Creat: 1.1 mg/dL (ref 0.70–1.11)
GFR, Est African American: 72 mL/min/{1.73_m2} (ref >=60)
GFR, Est Non African American: 62 mL/min/{1.73_m2} (ref >=60)
Glucose, Bld: 100 mg/dL — ABNORMAL HIGH (ref 65–99)
Potassium: 4 mmol/L (ref 3.5–5.3)
Sodium: 142 mmol/L (ref 135–146)

## 2019-04-05 ENCOUNTER — Ambulatory Visit: Payer: Self-pay | Admitting: General Practice

## 2019-04-05 ENCOUNTER — Telehealth: Payer: Self-pay | Admitting: General Practice

## 2019-04-05 DIAGNOSIS — E782 Mixed hyperlipidemia: Secondary | ICD-10-CM | POA: Diagnosis not present

## 2019-04-05 DIAGNOSIS — I10 Essential (primary) hypertension: Secondary | ICD-10-CM

## 2019-04-05 DIAGNOSIS — R413 Other amnesia: Secondary | ICD-10-CM

## 2019-04-05 DIAGNOSIS — R296 Repeated falls: Secondary | ICD-10-CM

## 2019-04-05 DIAGNOSIS — R600 Localized edema: Secondary | ICD-10-CM

## 2019-04-05 NOTE — Patient Instructions (Signed)
Visit Information  Goals Addressed            This Visit's Progress   . [name]'s memory is failing (pt-stated)       Current Barriers:  Randy [name] Knowledge Deficits related to falls prevention, decline in memory . Cognitive Deficits . Memory issues  Nurse Case Manager Clinical Goal(s):  Randy [name] Over the next 90 days, patient will work with [facility]  to address needs related to falls prevention and memory improvement  Interventions:  . Provided verbal education re: Potential causes of falls and Fall prevention strategies . Assessed for falls since last encounter.-No new falls (04-05-2019): patient compliant using cane Daughter confirms not new falls recently . Assessed patients knowledge of fall risk prevention secondary to previously provided education. Review of safety in the home  . Evaluation of current treatment plan related to dementia and patient's adherence to plan as established by provider. [name] with PCP and CCM Pharm D  regarding patient's symptoms and daughter's concerns. Both advised to request patient stop Hydroclorathiazide and continue to monitor blood pressure.  The patient remains off of the Hydrochlorothiazide  . Discussed plans with patient for ongoing care management follow up and provided patient with direct contact information for care management team . Reviewed episodes of dizziness and light headedness, per the daughter this has gotten a lot better since not taking HCTZ . She stated he dad does check his blood pressures daily, his blood pressure has been stable  . BP Readings from Last 3 Encounters: .  03/02/19 . 121/78 .  01/22/19 . 121/75 .  12/21/18 . 136/78        Patient Self Care Activities:  . [name] (assistive device) appropriately with all ambulation . De-clutter walkways . Change positions slowly . Wear secure fitting shoes at all times with ambulation . Utilize home lighting for dim lit areas . Have self and pet awareness at all  times   Patient Self Care Activities:  . Currently UNABLE TO independently prevent falls  . Attends all scheduled provider appointments . Performs ADL's independently  Please see past updates related to this goal by clicking on the "Past Updates" button in the selected goal      . RNCM: Recently Dad has had swelling in both of his legs and feet       Current Barriers:  Randy [name] Knowledge Deficits related to management of HTN as evidence of swelling in feet and legs, not always being able to eat a heart healthy diet . Cognitive Deficits  Nurse Case Manager Clinical Goal(s):  Randy [name] Over the next 90 days, patient will verbalize understanding of plan for managing HTN and HLD . Over the next 90 days, patient will work with [facility] [place] pharmacist and pcp to address needs related to managing HTN and increased swelling in feet and legs . Over the next 90 days, patient will demonstrate a decrease in bilateral lower extremity edema exacerbations as evidenced by normalized blood pressures and no evidence of edema present in bilateral lower legs . Over the next 90 days, patient will demonstrate improved adherence to prescribed treatment plan for wearing knee high ted hose as evidenced bydecreased bilateral lower leg edema . Over the next 90 days, patient will work with CM team pharmacist to review medications as needed and take Lasix as prescribed by pcp . Over the next 90 days, the patient will demonstrate ongoing self health care management ability as evidenced by elevating legs when sitting, taking meds as prescribed, following  a heart healthy diet, and wearing ted hose to reduce bilateral lower leg edema.  Interventions:  . Evaluation of current treatment plan related to HTN and HLD and patient's adherence to plan as established by provider. . Provided education to patient re: best way to put on ted hose with the use of a plastic grocery bag for ease of putting on hose. Also alternative compression socks" found  on [place] or at [place] that my be beneficial for the patient . Reviewed medications with patient and discussed the use of Lasix. The daughter verbalized the patient did not have any Lasix. The patient has been started back on Lasix and per the daughter today the Lasix has been effective in helping with the swelling and she has noted decrease in swelling.  Randy [name] Collaborated with pcp regarding Lasix RX. Dr. Parks Ranger to send in script for Lasix to use short term. Completed  . Call back to the patients  daughter [name] and discussed Lasix use and parameters set by Dr. Parks Ranger Completed  . Reviewed with the patients daughter recent lab work values.  Education and support given.  . Discussed plans with patient for ongoing care management follow up and provided patient with direct contact information for care management team . Advised patient, providing education and rationale, to monitor blood pressure daily and record, calling pcp for findings outside established parameters.  . Provided patient with heart healthy  educational materials related to adhering to heart healthy diet  . Pharmacy referral for medication change follow up and support . Educated daughter on the [place] functionality to view notes/lab work and communicate with the pcp team  Patient Self Care Activities:  . Patient verbalizes understanding of plan to use Lasix prn for swelling in feet and legs per MD recommendations . Attends all scheduled provider appointments . Calls provider office for new concerns or questions . Unable to independently manage bilateral lower leg edema  Please see past updates related to this goal by clicking on the "Past Updates" button in the selected goal         Patient verbalizes understanding of instructions provided today.   The care management team will reach out to the patient again over the next 60 to 90 days.   Noreene Larsson RN, MSN, [place] Mobile: [phone]

## 2019-04-05 NOTE — Chronic Care Management (AMB) (Signed)
Chronic Care Management   Follow Up Note   04/05/2019 Name: Randy [name] MRN: [id number] DOB: 1934/07/17  Referred by: Randy Hauser, DO Reason for referral : Chronic Care Management (Follow up on Memory Loss/HLD/HTN/swelling/any new falls)   Randy [name] is a 85 y.o. year old male who is a primary care patient of Randy Hauser, DO. The CCM team was consulted for assistance with chronic disease management and care coordination needs.    Review of patient status, including review of consultants reports, relevant laboratory and other test results, and collaboration with appropriate care team members and the patient's provider was performed as part of comprehensive patient evaluation and provision of chronic care management services.    SDOH (Social Determinants of Health) assessments performed: No See Care Plan activities for detailed interventions related to [facility])     Outpatient Encounter Medications as of 04/05/2019  Medication Sig  . carbidopa-levodopa (SINEMET IR) 25-100 MG tablet Take 1 tablet by mouth 3 (three) times daily.  . cholecalciferol (VITAMIN D) 1000 units tablet Take 1,000 Units by mouth daily.  . Coenzyme Q10 (CO Q 10) 100 MG CAPS Take 1 capsule by mouth daily.   . diclofenac Sodium (VOLTAREN) 1 % GEL Apply 2 g topically 3 (three) times daily as needed. To knee arthritis pain  . enalapril (VASOTEC) 20 MG tablet TAKE 1 TABLET BY MOUTH TWICE A DAY  . fluticasone (FLONASE) 50 MCG/ACT nasal spray Place 2 sprays into both nostrils daily as needed for allergies.   . furosemide (LASIX) 20 MG tablet Take 20 mg by mouth every other day.   [name] MELATONIN PO Take 1 tablet by mouth at bedtime.  . Omega-3 Fatty Acids (FISH OIL) 1200 MG CAPS Take 1,200 mg by mouth 2 (two) times daily.   [name] omeprazole (PRILOSEC) 20 MG capsule Take 20 mg by mouth daily.  . vitamin C (ASCORBIC ACID) 500 MG tablet Take 500 mg by mouth daily.   No facility-administered encounter  medications on file as of 04/05/2019.     Objective:   Goals Addressed            This Visit's Progress   . [name]'s memory is failing (pt-stated)       Current Barriers:  [name] Knowledge Deficits related to falls prevention, decline in memory . Cognitive Deficits . Memory issues  Nurse Case Manager Clinical Goal(s):  [name] Over the next 90 days, patient will work with [facility]  to address needs related to falls prevention and memory improvement  Interventions:  . Provided verbal education re: Potential causes of falls and Fall prevention strategies . Assessed for falls since last encounter.-No new falls (04-05-2019): patient compliant using cane Daughter confirms not new falls recently . Assessed patients knowledge of fall risk prevention secondary to previously provided education. Review of safety in the home  . Evaluation of current treatment plan related to dementia and patient's adherence to plan as established by provider. Randy [name] with PCP and CCM Pharm D  regarding patient's symptoms and daughter's concerns. Both advised to request patient stop Hydroclorathiazide and continue to monitor blood pressure.  The patient remains off of the Hydrochlorothiazide  . Discussed plans with patient for ongoing care management follow up and provided patient with direct contact information for care management team . Reviewed episodes of dizziness and light headedness, per the daughter this has gotten a lot better since not taking HCTZ . She stated he dad does check his blood pressures daily, his blood  pressure has been stable  . BP Readings from Last 3 Encounters: .  03/02/19 . 121/78 .  01/22/19 . 121/75 .  12/21/18 . 136/78        Patient Self Care Activities:  . [name] (assistive device) appropriately with all ambulation . De-clutter walkways . Change positions slowly . Wear secure fitting shoes at all times with ambulation . Utilize home lighting for dim lit areas . Have self  and pet awareness at all times   Patient Self Care Activities:  . Currently UNABLE TO independently prevent falls  . Attends all scheduled provider appointments . Performs ADL's independently  Please see past updates related to this goal by clicking on the "Past Updates" button in the selected goal      . RNCM: Recently Dad has had swelling in both of his legs and feet       Current Barriers:  [name] Knowledge Deficits related to management of HTN as evidence of swelling in feet and legs, not always being able to eat a heart healthy diet . Cognitive Deficits  Nurse Case Manager Clinical Goal(s):  [name] Over the next 90 days, patient will verbalize understanding of plan for managing HTN and HLD . Over the next 90 days, patient will work with [facility] [place] pharmacist and pcp to address needs related to managing HTN and increased swelling in feet and legs . Over the next 90 days, patient will demonstrate a decrease in bilateral lower extremity edema exacerbations as evidenced by normalized blood pressures and no evidence of edema present in bilateral lower legs . Over the next 90 days, patient will demonstrate improved adherence to prescribed treatment plan for wearing knee high ted hose as evidenced bydecreased bilateral lower leg edema . Over the next 90 days, patient will work with CM team pharmacist to review medications as needed and take Lasix as prescribed by pcp . Over the next 90 days, the patient will demonstrate ongoing self health care management ability as evidenced by elevating legs when sitting, taking meds as prescribed, following a heart healthy diet, and wearing ted hose to reduce bilateral lower leg edema.  Interventions:  . Evaluation of current treatment plan related to HTN and HLD and patient's adherence to plan as established by provider. . Provided education to patient re: best way to put on ted hose with the use of a plastic grocery bag for ease of putting on hose. Also alternative  compression socks" found on [place] or at [place] that my be beneficial for the patient . Reviewed medications with patient and discussed the use of Lasix. The daughter verbalized the patient did not have any Lasix. The patient has been started back on Lasix and per the daughter today the Lasix has been effective in helping with the swelling and she has noted decrease in swelling.  [name] Collaborated with pcp regarding Lasix RX. Dr. Parks Ranger to send in script for Lasix to use short term. Completed  . Call back to the patients  daughter [name] and discussed Lasix use and parameters set by Dr. Parks Ranger Completed  . Reviewed with the patients daughter recent lab work values.  Education and support given.  . Discussed plans with patient for ongoing care management follow up and provided patient with direct contact information for care management team . Advised patient, providing education and rationale, to monitor blood pressure daily and record, calling pcp for findings outside established parameters.  . Provided patient with heart healthy  educational materials related to adhering  to heart healthy diet  . Pharmacy referral for medication change follow up and support . Educated daughter on the [place] functionality to view notes/lab work and communicate with the pcp team  Patient Self Care Activities:  . Patient verbalizes understanding of plan to use Lasix prn for swelling in feet and legs per MD recommendations . Attends all scheduled provider appointments . Calls provider office for new concerns or questions . Unable to independently manage bilateral lower leg edema  Please see past updates related to this goal by clicking on the "Past Updates" button in the selected goal          Plan:   The care management team will reach out to the patient again over the next 60 to 90 days days.    Noreene Larsson RN, MSN, [place] Mobile: [phone]

## 2019-04-20 ENCOUNTER — Other Ambulatory Visit: Payer: Self-pay | Admitting: Family Medicine

## 2019-04-20 DIAGNOSIS — R600 Localized edema: Secondary | ICD-10-CM

## 2019-04-23 ENCOUNTER — Ambulatory Visit (INDEPENDENT_AMBULATORY_CARE_PROVIDER_SITE_OTHER): Payer: Medicare Other | Admitting: Pharmacist

## 2019-04-23 DIAGNOSIS — I10 Essential (primary) hypertension: Secondary | ICD-10-CM | POA: Diagnosis not present

## 2019-04-23 DIAGNOSIS — R413 Other amnesia: Secondary | ICD-10-CM

## 2019-04-23 NOTE — Chronic Care Management (AMB) (Signed)
Chronic Care Management   Follow Up Note   04/23/2019 Name: Randy [name] MRN: [id number] DOB: [birth year]  Referred by: [name], DO Reason for referral : Chronic Care Management (Caregiver Phone Call)   Randy [name] is a 85 y.o. year old male who is a primary care patient of [name], DO. The CCM team was consulted for assistance with chronic disease management and care coordination needs. Randy [name] has a past medical history including, but not limited to: hypertension, osteoarthritis, hyperlipidemia, insomnia and memory loss.  I reached out to patient's daughter/caregiver, [name], by phone today.    Review of patient status, including review of consultants reports, relevant laboratory and other test results, and collaboration with appropriate care team members and the patient's provider was performed as part of comprehensive patient evaluation and provision of chronic care management services.    Objective  BP Readings from Last 3 Encounters:  03/02/19 121/78  01/22/19 121/75  12/21/18 136/78   Lab Results  Component Value Date   CREATININE 1.10 04/02/2019   CREATININE 0.97 09/10/2018   CREATININE 1.01 09/09/2018    Lab Results  Component Value Date   CREATININE 1.10 04/02/2019   BUN 26 (H) 04/02/2019   NA 142 04/02/2019   K 4.0 04/02/2019   CL 107 04/02/2019   CO2 25 04/02/2019    Outpatient Encounter Medications as of 04/23/2019  Medication Sig  . diclofenac Sodium (VOLTAREN) 1 % GEL Apply 2 g topically 3 (three) times daily as needed. To knee arthritis pain  . enalapril (VASOTEC) 20 MG tablet TAKE 1 TABLET BY MOUTH TWICE A DAY  . furosemide (LASIX) 20 MG tablet Take 20 mg by mouth every other day.   . carbidopa-levodopa (SINEMET IR) 25-100 MG tablet Take 1 tablet by mouth 3 (three) times daily.  . cholecalciferol (VITAMIN D) 1000 units tablet Take 1,000 Units by mouth daily.  . Coenzyme Q10 (CO Q 10) 100 MG CAPS Take 1 capsule by  mouth daily.   . fluticasone (FLONASE) 50 MCG/ACT nasal spray Place 2 sprays into both nostrils daily as needed for allergies.   [name] MELATONIN PO Take 1 tablet by mouth at bedtime.  . Omega-3 Fatty Acids (FISH OIL) 1200 MG CAPS Take 1,200 mg by mouth 2 (two) times daily.   [name] omeprazole (PRILOSEC) 20 MG capsule Take 20 mg by mouth daily.  . vitamin C (ASCORBIC ACID) 500 MG tablet Take 500 mg by mouth daily.   No facility-administered encounter medications on file as of 04/23/2019.    Goals Addressed            This Visit's Progress   . PharmD - Medication Management       CARE PLAN ENTRY (see longitudinal plan of care for additional care plan information)  Current Barriers:    . Cognitive Deficits  Pharmacist Clinical Goal(s):  [name] Over the next 30 days, patient/caregiver will work with CM Pharmacist to address needs related to medication management  Interventions: . Follow up regarding blood pressure monitoring and control ? Reports patient taking:  ? enalapril 20 mg twice daily ? Furosemide 20 mg every other day ? Reports patient still has a little swelling in his legs, but stable ? Reports swelling worse at end of day. Reiterate counseling from [facility] RN about having patient elevate legs. ? Also note CCM RN has been working with patient regarding strategies for improving ease of use of compression stockings ? Denies checking patient's BP recently ?  Encourage to aid patient with continuing to check, keep log and contact office for readings outside of provided parameters or new symptoms ? Review BP monitoring technique counseling . Inquire about patient's knee pain ? Daughter reports continues to be a chronic problem for patient ? Reports found that patient is using diclofenac gel at bedtime, but forgetting during the day - reports reminded patient that he can use during day and Mrs. Leard planning to help remind him  Patient Self Care Activities:  . Patient to take medications as  directed with assistance of daughter o Daughter aids Mr. Champion with filling his weekly pillbox and monitoring for adherence . Attends all scheduled provider appointments . Calls pharmacy for medication refills . Calls provider office for new concerns or questions . Patient/daughter to keep log of BP readings   Please see past updates related to this goal by clicking on the "Past Updates" button in the selected goal         Plan  Telephone follow up appointment with care management team member scheduled for: 4/30 at 9 am to review BP monitoring results  [name], PharmD, [place] [phone]

## 2019-04-23 NOTE — Patient Instructions (Signed)
Thank you allowing the Chronic Care Management Team to be a part of your care! It was a pleasure speaking with you today!     CCM (Chronic Care Management) Team    Noreene Larsson RN, MSN, CCM Nurse Care Coordinator  [phone]   [name] PharmD  Clinical Pharmacist  [phone]   Eula Fried LCSW Clinical Social Worker [phone]  Visit Information  Goals Addressed            This Visit's Progress   . PharmD - Medication Management       CARE PLAN ENTRY (see longitudinal plan of care for additional care plan information)  Current Barriers:    . Cognitive Deficits  Pharmacist Clinical Goal(s):  [name] Over the next 30 days, patient/caregiver will work with CM Pharmacist to address needs related to medication management  Interventions: . Follow up regarding blood pressure monitoring and control ? Reports patient taking:  ? enalapril 20 mg twice daily ? Furosemide 20 mg every other day ? Reports patient still has a little swelling in his legs, but stable ? Reports swelling worse at end of day. Reiterate counseling from [facility] RN about having patient elevate legs. ? Also note CCM RN has been working with patient regarding strategies for improving ease of use of compression stockings ? Denies checking patient's BP recently ? Encourage to aid patient with continuing to check, keep log and contact office for readings outside of provided parameters or new symptoms ? Review BP monitoring technique counseling . Inquire about patient's knee pain ? Daughter reports continues to be a chronic problem for patient ? Reports found that patient is using diclofenac gel at bedtime, but forgetting during the day - reports reminded patient that he can use during day and Mrs. Vandegrift planning to help remind him  Patient Self Care Activities:  . Patient to take medications as directed with assistance of daughter o Daughter aids Mr. Huebner with filling his weekly pillbox and monitoring for  adherence . Attends all scheduled provider appointments . Calls pharmacy for medication refills . Calls provider office for new concerns or questions . Patient/daughter to keep log of BP readings   Please see past updates related to this goal by clicking on the "Past Updates" button in the selected goal         Patient verbalizes understanding of instructions provided today.   Telephone follow up appointment with care management team member scheduled for: 4/30 at 9 am to review blood pressure monitoring results  [name], PharmD, [place] [phone]

## 2019-04-27 ENCOUNTER — Ambulatory Visit: Payer: Self-pay

## 2019-04-27 NOTE — Chronic Care Management (AMB) (Signed)
  Care Management   Follow Up Note   04/27/2019 Name: Randy [name] MRN: [id number] DOB: Nov 10, 1934  Referred by: [name], DO Reason for referral : Care Coordination   Randy [name] is a 85 y.o. year old male who is a primary care patient of [name], DO. The care management team was consulted for assistance with care management and care coordination needs.    Review of patient status, including review of consultants reports, relevant laboratory and other test results, and collaboration with appropriate care team members and the patient's provider was performed as part of comprehensive patient evaluation and provision of chronic care management services.    LCSW completed CCM outreach attempt today but was unable to reach patient successfully. A HIPPA compliant voice message was left encouraging patient to return call once available. LCSW rescheduled CCM SW appointment as well.  A HIPPA compliant phone message was left for the patient providing contact information and requesting a return call.   Eula Fried, BSW, MSW, [email or web address] Phone: [phone]

## 2019-05-07 ENCOUNTER — Ambulatory Visit: Payer: Self-pay | Admitting: Pharmacist

## 2019-05-07 DIAGNOSIS — I10 Essential (primary) hypertension: Secondary | ICD-10-CM

## 2019-05-07 NOTE — Patient Instructions (Signed)
Thank you allowing the Chronic Care Management Team to be a part of your care! It was a pleasure speaking with you today!     CCM (Chronic Care Management) Team    Noreene Larsson RN, MSN, CCM Nurse Care Coordinator  [phone]   [name] PharmD  Clinical Pharmacist  [phone]   Eula Fried LCSW Clinical Social Worker [phone]  Visit Information  Goals Addressed            This Visit's Progress   . PharmD - Medication Management       CARE PLAN ENTRY (see longitudinal plan of care for additional care plan information)  Current Barriers:    . Cognitive Deficits  Pharmacist Clinical Goal(s):  [name] Over the next 30 days, patient/caregiver will work with CM Pharmacist to address needs related to medication management  Interventions: . Follow up regarding blood pressure monitoring and control ? Reports patient taking:  ? enalapril 20 mg twice daily ? Furosemide 20 mg every other day ? Reports patient still has a little swelling in his legs, but stable ? Reports swelling worse at end of day. Reiterate counseling from CCM RN and PCP about having patient elevate legs. ? Also note CCM RN has been working with patient regarding strategies for improving ease of use of compression stockings ? Daughter reports continued difficulty with use. Encourage daughter to follow up with CCM RN ? Reports patient has been checking BP at home and keeping a log, but forgot to bring log with her today. Unable to reach patient by phone today. ? Daughter states that she will send me BP readings when she gets home today ? Encourage to aid patient with continuing to check, keep log and contact office for readings outside of provided parameters or new symptoms . Inquire about patient's knee pain ? Daughter reports continues to be a chronic problem for patient ? Reports patient now using diclofenac gel more often during the day, in addition to at bedtime, with some relief ? Encourage  caregiver/patient to follow up with PCP as needed for continued/worsening knee pain . Will collaborate with CCM RN requesting follow up to daughter/patient about strategies for compression stockings  Patient Self Care Activities:  . Patient to take medications as directed with assistance of daughter o Daughter aids Mr. Rupe with filling his weekly pillbox and monitoring for adherence . Attends all scheduled provider appointments . Calls pharmacy for medication refills . Calls provider office for new concerns or questions . Patient/daughter to keep log of BP readings   Please see past updates related to this goal by clicking on the "Past Updates" button in the selected goal         Patient verbalizes understanding of instructions provided today.   The care management team will reach out to the patient again over the next 14 days.   [name], PharmD, [place] [employer] [phone]

## 2019-05-07 NOTE — Chronic Care Management (AMB) (Signed)
Chronic Care Management   Follow Up Note   05/07/2019 Name: Randy [name] MRN: [id number] DOB: 1934-06-12  Referred by: [name], DO Reason for referral : Chronic Care Management (Caregiver Phone Call)   Randy [name] is a 85 y.o. year old male who is a primary care patient of [name], DO. The CCM team was consulted for assistance with chronic disease management and care coordination needs. Mr. Steelman has a past medical history including, but not limited to: hypertension, osteoarthritis, hyperlipidemia, insomnia and memory loss.  I reached out to patient's daughter/caregiver, Randy [name], by phone today.    Unable to reach patient by phone today.   Review of patient status, including review of consultants reports, relevant laboratory and other test results, and collaboration with appropriate care team members and the patient's provider was performed as part of comprehensive patient evaluation and provision of chronic care management services.     Outpatient Encounter Medications as of 05/07/2019  Medication Sig  . carbidopa-levodopa (SINEMET IR) 25-100 MG tablet Take 1 tablet by mouth 3 (three) times daily.  . cholecalciferol (VITAMIN D) 1000 units tablet Take 1,000 Units by mouth daily.  . Coenzyme Q10 (CO Q 10) 100 MG CAPS Take 1 capsule by mouth daily.   . diclofenac Sodium (VOLTAREN) 1 % GEL Apply 2 g topically 3 (three) times daily as needed. To knee arthritis pain  . enalapril (VASOTEC) 20 MG tablet TAKE 1 TABLET BY MOUTH TWICE A DAY  . fluticasone (FLONASE) 50 MCG/ACT nasal spray Place 2 sprays into both nostrils daily as needed for allergies.   . furosemide (LASIX) 20 MG tablet Take 20 mg by mouth every other day.   [name] MELATONIN PO Take 1 tablet by mouth at bedtime.  . Omega-3 Fatty Acids (FISH OIL) 1200 MG CAPS Take 1,200 mg by mouth 2 (two) times daily.   [name] omeprazole (PRILOSEC) 20 MG capsule Take 20 mg by mouth daily.  . vitamin C (ASCORBIC ACID)  500 MG tablet Take 500 mg by mouth daily.   No facility-administered encounter medications on file as of 05/07/2019.    Goals Addressed            This Visit's Progress   . PharmD - Medication Management       CARE PLAN ENTRY (see longitudinal plan of care for additional care plan information)  Current Barriers:    . Cognitive Deficits  Pharmacist Clinical Goal(s):  [name] Over the next 30 days, patient/caregiver will work with CM Pharmacist to address needs related to medication management  Interventions: . Follow up regarding blood pressure monitoring and control ? Reports patient taking:  ? enalapril 20 mg twice daily ? Furosemide 20 mg every other day ? Reports patient still has a little swelling in his legs, but stable ? Reports swelling worse at end of day. Reiterate counseling from CCM RN and PCP about having patient elevate legs. ? Also note CCM RN has been working with patient regarding strategies for improving ease of use of compression stockings ? Daughter reports continued difficulty with use. Encourage daughter to follow up with CCM RN ? Reports patient has been checking BP at home and keeping a log, but forgot to bring log with her today. Unable to reach patient by phone today. ? Daughter states that she will send me BP readings when she gets home today ? Encourage to aid patient with continuing to check, keep log and contact office for readings outside of provided  parameters or new symptoms . Inquire about patient's knee pain ? Daughter reports continues to be a chronic problem for patient ? Reports patient now using diclofenac gel more often during the day, in addition to at bedtime, with some relief ? Encourage caregiver/patient to follow up with PCP as needed for continued/worsening knee pain . Will collaborate with CCM RN requesting follow up to daughter/patient about strategies for compression stockings  Patient Self Care Activities:  . Patient to take  medications as directed with assistance of daughter o Daughter aids Mr. Vardeman with filling his weekly pillbox and monitoring for adherence . Attends all scheduled provider appointments . Calls pharmacy for medication refills . Calls provider office for new concerns or questions . Patient/daughter to keep log of BP readings   Please see past updates related to this goal by clicking on the "Past Updates" button in the selected goal         Plan  The care management team will reach out to the patient again over the next 14 days.   [name], PharmD, [place] [employer] [phone]

## 2019-05-28 ENCOUNTER — Ambulatory Visit: Payer: Self-pay | Admitting: Pharmacist

## 2019-05-28 NOTE — Chronic Care Management (AMB) (Signed)
Chronic Care Management   Follow Up Note   05/28/2019 Name: Randy [name] MRN: [id number] DOB: Jan 02, 1935  Referred by: Randy Hauser, DO Reason for referral : Chronic Care Management (Caregiver Phone Call)   Randy [name] is a 85 y.o. year old male who is a primary care patient of Randy Hauser, DO. The CCM team was consulted for assistance with chronic disease management and care coordination needs.  [name] has a past medical history including, but not limited to: hypertension, osteoarthritis, hyperlipidemia, insomnia and memory loss.  I reached out to patient's daughter/caregiver, Randy [name], by phone today.    Review of patient status, including review of consultants reports, relevant laboratory and other test results, and collaboration with appropriate care team members and the patient's provider was performed as part of comprehensive patient evaluation and provision of chronic care management services.      Outpatient Encounter Medications as of 05/28/2019  Medication Sig  . enalapril (VASOTEC) 20 MG tablet TAKE 1 TABLET BY MOUTH TWICE A DAY  . furosemide (LASIX) 20 MG tablet Take 20 mg by mouth every other day. Taking on Mondays, Wednesdays and Fridays  . carbidopa-levodopa (SINEMET IR) 25-100 MG tablet Take 1 tablet by mouth 3 (three) times daily.  . cholecalciferol (VITAMIN D) 1000 units tablet Take 1,000 Units by mouth daily.  . Coenzyme Q10 (CO Q 10) 100 MG CAPS Take 1 capsule by mouth daily.   . diclofenac Sodium (VOLTAREN) 1 % GEL Apply 2 g topically 3 (three) times daily as needed. To knee arthritis pain  . fluticasone (FLONASE) 50 MCG/ACT nasal spray Place 2 sprays into both nostrils daily as needed for allergies.   [name] MELATONIN PO Take 1 tablet by mouth at bedtime.  . Omega-3 Fatty Acids (FISH OIL) 1200 MG CAPS Take 1,200 mg by mouth 2 (two) times daily.   [name] omeprazole (PRILOSEC) 20 MG capsule Take 20 mg by mouth daily.  . vitamin C (ASCORBIC ACID)  500 MG tablet Take 500 mg by mouth daily.   No facility-administered encounter medications on file as of 05/28/2019.    Goals Addressed            This Visit's Progress   . PharmD - Medication Management       CARE PLAN ENTRY (see longitudinal plan of care for additional care plan information)  Current Barriers:    . Cognitive Deficits  Pharmacist Clinical Goal(s):  [name] Over the next 30 days, patient/caregiver will work with CM Pharmacist to address needs related to medication management  Interventions: . Received previous BP readings from patient's daughter (see below) . Follow up regarding blood pressure monitoring and control ? Reports patient taking:  ? enalapril 20 mg twice daily ? Furosemide 20 mg once daily on Mondays, Wednesdays and Fridays ? Reports patient still has a little swelling in his legs, but thinks somewhat better since using compression stockings more consistently ? States patient now successful with getting on compression stockings ? Believes patient has been checking BP at home and keeping a log, but does not have record today. ? Daughter states that she will send updated BP readings prior to our next phone call ? Encourage to aid patient with continuing to check, keep log and contact office for readings outside of provided parameters or new symptoms . Coordination of care: Per 1/15 office visit note, patient to return to PCP for 3-6 month follow up about knee pain. ? Daughter states knee pain continues to  be a chronic problem for patient. States will call to schedule this follow up.  Patient Self Care Activities:  . Patient to take medications as directed with assistance of daughter o Daughter aids Mr. Bosshart with filling his weekly pillbox and monitoring for adherence . Attends all scheduled provider appointments . Calls pharmacy for medication refills . Calls provider office for new concerns or questions . Patient/daughter to keep log of BP readings  AM  Blood Pressure PM Blood Pressure  20 - April - 127/75, HR 76  21 - April - -  22 - April - 128/81, HR 66  23 - April - 131/78, HR 69  24 - April - -  25 - April 125/83, HR 60 137/80, HR 61  26 - April - 130/79, HR 66    Please see past updates related to this goal by clicking on the "Past Updates" button in the selected goal         Plan  Telephone follow up appointment with care management team member scheduled for: 6/16 at 9 am  [name], PharmD, [place] [phone]

## 2019-05-28 NOTE — Patient Instructions (Signed)
Thank you allowing the Chronic Care Management Team to be a part of your care! It was a pleasure speaking with you today!     CCM (Chronic Care Management) Team    Noreene Larsson RN, MSN, CCM Nurse Care Coordinator  [phone]   [name] PharmD  Clinical Pharmacist  [phone]   Eula Fried LCSW Clinical Social Worker [phone]  Visit Information  Goals Addressed            This Visit's Progress   . PharmD - Medication Management       CARE PLAN ENTRY (see longitudinal plan of care for additional care plan information)  Current Barriers:    . Cognitive Deficits  Pharmacist Clinical Goal(s):  [name] Over the next 30 days, patient/caregiver will work with CM Pharmacist to address needs related to medication management  Interventions: . Received previous BP readings from patient's daughter (see below) . Follow up regarding blood pressure monitoring and control ? Reports patient taking:  ? enalapril 20 mg twice daily ? Furosemide 20 mg once daily on Mondays, Wednesdays and Fridays ? Reports patient still has a little swelling in his legs, but thinks somewhat better since using compression stockings more consistently ? States patient now successful with getting on compression stockings ? Believes patient has been checking BP at home and keeping a log, but does not have record today. ? Daughter states that she will send updated BP readings prior to our next phone call ? Encourage to aid patient with continuing to check, keep log and contact office for readings outside of provided parameters or new symptoms . Coordination of care: Per 1/15 office visit note, patient to return to PCP for 3-6 month follow up about knee pain. ? Daughter states knee pain continues to be a chronic problem for patient. States will call to schedule this follow up.  Patient Self Care Activities:  . Patient to take medications as directed with assistance of daughter o Daughter aids Mr.  Moravec with filling his weekly pillbox and monitoring for adherence . Attends all scheduled provider appointments . Calls pharmacy for medication refills . Calls provider office for new concerns or questions . Patient/daughter to keep log of BP readings  AM Blood Pressure PM Blood Pressure  20 - April - 127/75, HR 76  21 - April - -  22 - April - 128/81, HR 66  23 - April - 131/78, HR 69  24 - April - -  25 - April 125/83, HR 60 137/80, HR 61  26 - April - 130/79, HR 66    Please see past updates related to this goal by clicking on the "Past Updates" button in the selected goal         Patient verbalizes understanding of instructions provided today.   Telephone follow up appointment with care management team member scheduled for: 6/16 at 9 am  [name], PharmD, [place] [phone]

## 2019-06-01 ENCOUNTER — Other Ambulatory Visit: Payer: Self-pay | Admitting: Family Medicine

## 2019-06-01 DIAGNOSIS — M1712 Unilateral primary osteoarthritis, left knee: Secondary | ICD-10-CM

## 2019-06-01 NOTE — Telephone Encounter (Signed)
Requested Prescriptions  Pending Prescriptions Disp Refills  . diclofenac Sodium (VOLTAREN) 1 % GEL [Pharmacy Med Name: DICLOFENAC SODIUM 1% GEL] 100 g 2    Sig: APPLY 2 G TOPICALLY 3 (THREE) TIMES DAILY AS NEEDED. TO KNEE ARTHRITIS PAIN     Analgesics:  Topicals Passed - 06/01/2019  3:46 PM      Passed - Valid encounter within last 12 months    Recent Outpatient Visits          4 months ago Tricompartment osteoarthritis of left knee   [place], DO   8 months ago Primary insomnia   [place], DO   8 months ago BPH without obstruction/lower urinary tract symptoms   [place], DO   11 months ago Annual physical exam   [place], DO   1 year ago Chronic bilateral low back pain without sciatica   [facility] [name], DO      Future Appointments            In 3 weeks Diamantina [place], Herbert Seta, MD [place]

## 2019-06-17 ENCOUNTER — Telehealth: Payer: Self-pay

## 2019-06-17 ENCOUNTER — Other Ambulatory Visit: Payer: Self-pay | Admitting: Family Medicine

## 2019-06-17 DIAGNOSIS — R600 Localized edema: Secondary | ICD-10-CM

## 2019-06-17 NOTE — Telephone Encounter (Signed)
Requested medication (s) are due for refill today: yes  Requested medication (s) are on the active medication list: yes  Last refill:  06/07/19  Future visit scheduled: no  Notes to clinic:  historical provider    Requested Prescriptions  Pending Prescriptions Disp Refills   furosemide (LASIX) 20 MG tablet [Pharmacy Med Name: FUROSEMIDE 20 MG TABLET] 30 tablet 2    Sig: TAKE 1-2 TABLETS (20-40 MG TOTAL) BY MOUTH DAILY AS NEEDED FOR EDEMA. FOR UP TO 3-7 DAYS AT A TIME. IF NEED 40MG , CAN TAKE 2 AT ONCE OR 1 TWICE A DAY.      Cardiovascular:  Diuretics - Loop Passed - 06/17/2019 11:33 AM      Passed - K in normal range and within 360 days    Potassium  Date Value Ref Range Status  04/02/2019 4.0 3.5 - 5.3 mmol/L Final  04/17/2015 4.0 mmol/L Final          Passed - Ca in normal range and within 360 days    Calcium  Date Value Ref Range Status  04/02/2019 9.0 8.6 - 10.3 mg/dL Final  04/17/2015 8.5 (A) 8.6 - 10.2 mg/dL Final          Passed - Na in normal range and within 360 days    Sodium  Date Value Ref Range Status  04/02/2019 142 135 - 146 mmol/L Final  04/17/2015 143  Final          Passed - Cr in normal range and within 360 days    Creat  Date Value Ref Range Status  04/02/2019 1.10 0.70 - 1.11 mg/dL Final    Comment:    For patients >37 years of age, the reference limit for Creatinine is approximately 13% higher for people identified as African-American. .           Passed - Last BP in normal range    BP Readings from Last 1 Encounters:  03/02/19 121/78          Passed - Valid encounter within last 6 months    Recent Outpatient Visits           4 months ago Tricompartment osteoarthritis of left knee   [place], DO   8 months ago Primary insomnia   [place], DO   9 months ago BPH without obstruction/lower urinary tract symptoms   [place], DO   1 year ago Annual physical exam   [place], DO   1 year ago Chronic bilateral low back pain without sciatica   [facility] [name], DO       Future Appointments             In 1 week [place] [place], Herbert Seta, [facility]

## 2019-06-18 ENCOUNTER — Other Ambulatory Visit: Payer: Self-pay | Admitting: Family Medicine

## 2019-06-18 DIAGNOSIS — C61 Malignant neoplasm of prostate: Secondary | ICD-10-CM

## 2019-06-21 ENCOUNTER — Other Ambulatory Visit: Payer: Self-pay

## 2019-06-21 ENCOUNTER — Other Ambulatory Visit: Payer: Medicare Other

## 2019-06-21 DIAGNOSIS — C61 Malignant neoplasm of prostate: Secondary | ICD-10-CM

## 2019-06-22 LAB — PSA: Prostate Specific Ag, Serum: 0.5 ng/mL (ref 0.0–4.0)

## 2019-06-23 ENCOUNTER — Ambulatory Visit (INDEPENDENT_AMBULATORY_CARE_PROVIDER_SITE_OTHER): Payer: Medicare Other | Admitting: Pharmacist

## 2019-06-23 DIAGNOSIS — I10 Essential (primary) hypertension: Secondary | ICD-10-CM

## 2019-06-23 NOTE — Chronic Care Management (AMB) (Signed)
Chronic Care Management   Follow Up Note   06/23/2019 Name: Randy [name] MRN: [id number] DOB: 08-Aug-1934  Referred by: Randy Hauser, DO Reason for referral : Chronic Care Management (Caregiver Phone Call)   Randy [name] is a 85 y.o. year old male who is a primary care patient of Randy Hauser, DO. The CCM team was consulted for assistance with chronic disease management and care coordination needs.  Randy [name] has a past medical history including, but not limited to: hypertension, osteoarthritis, hyperlipidemia, insomnia and memory loss.  I reached out to patient's daughter/caregiver, Randy [name], by phone today.    Review of patient status, including review of consultants reports, relevant laboratory and other test results, and collaboration with appropriate care team members and the patient's provider was performed as part of comprehensive patient evaluation and provision of chronic care management services.     Outpatient Encounter Medications as of 06/23/2019  Medication Sig  . carbidopa-levodopa (SINEMET IR) 25-100 MG tablet Take 1 tablet by mouth 4 (four) times daily.   . enalapril (VASOTEC) 20 MG tablet TAKE 1 TABLET BY MOUTH TWICE A DAY  . cholecalciferol (VITAMIN D) 1000 units tablet Take 1,000 Units by mouth daily.  . Coenzyme Q10 (CO Q 10) 100 MG CAPS Take 1 capsule by mouth daily.   . diclofenac Sodium (VOLTAREN) 1 % GEL APPLY 2 G TOPICALLY 3 (THREE) TIMES DAILY AS NEEDED. TO KNEE ARTHRITIS PAIN  . fluticasone (FLONASE) 50 MCG/ACT nasal spray Place 2 sprays into both nostrils daily as needed for allergies.   . furosemide (LASIX) 20 MG tablet Take 1 tablet (20 mg total) by mouth every other day. (Patient not taking: Reported on 06/23/2019)  . MELATONIN PO Take 1 tablet by mouth at bedtime.  . Omega-3 Fatty Acids (FISH OIL) 1200 MG CAPS Take 1,200 mg by mouth 2 (two) times daily.   [name] omeprazole (PRILOSEC) 20 MG capsule Take 20 mg by mouth daily.  . vitamin C  (ASCORBIC ACID) 500 MG tablet Take 500 mg by mouth daily.   No facility-administered encounter medications on file as of 06/23/2019.    Goals Addressed            This Visit's Progress   . PharmD - Medication Management       CARE PLAN ENTRY (see longitudinal plan of care for additional care plan information)  Current Barriers:    . Cognitive Deficits  Pharmacist Clinical Goal(s):  [name] Over the next 30 days, patient/caregiver will work with CM Pharmacist to address needs related to medication management  Interventions: . Perform chart review. Patient seen for follow up with Neurology on 5/24. Note provider advised patient to: "- Take Carbidopa-levodopa 25/100 mg 30-45 minutes before food - Take 1 tablet four times daily (roughly every 3-4 hours)" . Follow up with patient's daughter/caregiver regarding carbidopa-levodopa administration and dosing change  ? Confirms patient taking 30-45 minutes prior to food as directed ? Reports increased dosing frequency from three times daily to four times daily as directed as of 6/7 ? Discuss strategies to aid with medication adherence to four times/day dosing ? Confirms patient's pill box has slots for four times daily dosing . Follow up regarding blood pressure monitoring and control ? Reports patient taking:  ? enalapril 20 mg twice daily ? Reports has not been taking furosemide since 5/31 ? Reports patient still has a little swelling in his legs, but remains stable ? Note patient not recently using compression stockings due  to warmer weather, but notes able to reapply  ? Review recent BP readings (see below) ? Denies patient having any dizziness or other symptoms ? Encourage to aid patient with continuing to check, keep log and contact office for readings outside of provided parameters or new symptoms  Patient Self Care Activities:  . Patient to take medications as directed with assistance of daughter o Daughter aids Mr. Onder with filling  his weekly pillbox and monitoring for adherence . Attends all scheduled provider appointments . Calls pharmacy for medication refills . Calls provider office for new concerns or questions . Patient/daughter to keep log of BP readings  AM Blood Pressure PM Blood Pressure  7 - June  126/79, HR 70  8 - June 138/82, HR 59   9 - June 123/77, HR 73   10 - June    11 - June    12 - June  115/73, HR 98 124/80, HR 74  13 - June  144/85, HR68  14 - June  140/93, HR 68  15 - June - -  16 - June -     Please see past updates related to this goal by clicking on the "Past Updates" button in the selected goal         Plan  Telephone follow up appointment with care management team member scheduled for: 6/30 at 8:30 am  [name], PharmD, [place] [phone]

## 2019-06-23 NOTE — Patient Instructions (Signed)
Thank you allowing the Chronic Care Management Team to be a part of your care! It was a pleasure speaking with you today!     CCM (Chronic Care Management) Team    Noreene Larsson RN, MSN, CCM Nurse Care Coordinator  [phone]   [name] PharmD  Clinical Pharmacist  [phone]   Eula Fried LCSW Clinical Social Worker [phone]  Visit Information  Goals Addressed            This Visit's Progress   . PharmD - Medication Management       CARE PLAN ENTRY (see longitudinal plan of care for additional care plan information)  Current Barriers:    . Cognitive Deficits  Pharmacist Clinical Goal(s):  [name] Over the next 30 days, patient/caregiver will work with CM Pharmacist to address needs related to medication management  Interventions: . Perform chart review. Patient seen for follow up with Neurology on 5/24. Note provider advised patient to: "- Take Carbidopa-levodopa 25/100 mg 30-45 minutes before food - Take 1 tablet four times daily (roughly every 3-4 hours)" . Follow up with patient's daughter/caregiver regarding carbidopa-levodopa administration and dosing change  ? Confirms patient taking 30-45 minutes prior to food as directed ? Reports increased dosing frequency from three times daily to four times daily as directed as of 6/7 ? Discuss strategies to aid with medication adherence to four times/day dosing ? Confirms patient's pill box has slots for four times daily dosing . Follow up regarding blood pressure monitoring and control ? Reports patient taking:  ? enalapril 20 mg twice daily ? Reports has not been taking furosemide since 5/31 ? Reports patient still has a little swelling in his legs, but remains stable ? Note patient not recently using compression stockings due to warmer weather, but notes able to reapply  ? Review recent BP readings (see below) ? Denies patient having any dizziness or other symptoms ? Encourage to aid patient with  continuing to check, keep log and contact office for readings outside of provided parameters or new symptoms  Patient Self Care Activities:  . Patient to take medications as directed with assistance of daughter o Daughter aids Mr. Lybbert with filling his weekly pillbox and monitoring for adherence . Attends all scheduled provider appointments . Calls pharmacy for medication refills . Calls provider office for new concerns or questions . Patient/daughter to keep log of BP readings  AM Blood Pressure PM Blood Pressure  7 - June  126/79, HR 70  8 - June 138/82, HR 59   9 - June 123/77, HR 73   10 - June    11 - June    12 - June  115/73, HR 98 124/80, HR 74  13 - June  144/85, HR68  14 - June  140/93, HR 68  15 - June - -  16 - June -     Please see past updates related to this goal by clicking on the "Past Updates" button in the selected goal         Patient verbalizes understanding of instructions provided today.   Telephone follow up appointment with care management team member scheduled for: 6/30 at 8:30 am  [name], PharmD, [place] [phone]

## 2019-06-24 ENCOUNTER — Ambulatory Visit: Payer: Medicare Other | Admitting: Urology

## 2019-06-28 ENCOUNTER — Telehealth: Payer: Self-pay

## 2019-06-28 ENCOUNTER — Ambulatory Visit: Payer: Self-pay | Admitting: General Practice

## 2019-06-28 NOTE — Chronic Care Management (AMB) (Signed)
  Chronic Care Management   Outreach Note  06/28/2019 Name: Randy [name] MRN: [id number] DOB: May 29, 1934  Referred by: [name], DO Reason for referral : Chronic Care Management (Follow up: RNCM: Chronic Disease Management and Care Coordination Needs)   An unsuccessful telephone outreach was attempted today. The patient was referred to the case management team for assistance with care management and care coordination.   Follow Up Plan: A HIPPA compliant phone message was left for the patient providing contact information and requesting a return call.   Noreene Larsson RN, MSN, [place] Mobile: [phone]

## 2019-06-30 ENCOUNTER — Ambulatory Visit (INDEPENDENT_AMBULATORY_CARE_PROVIDER_SITE_OTHER): Payer: Medicare Other | Admitting: Urology

## 2019-06-30 ENCOUNTER — Encounter: Payer: Self-pay | Admitting: Urology

## 2019-06-30 ENCOUNTER — Other Ambulatory Visit: Payer: Self-pay

## 2019-06-30 VITALS — BP 163/79 | HR 66 | Ht 72.0 in | Wt 238.0 lb

## 2019-06-30 DIAGNOSIS — N401 Enlarged prostate with lower urinary tract symptoms: Secondary | ICD-10-CM | POA: Diagnosis not present

## 2019-06-30 DIAGNOSIS — C61 Malignant neoplasm of prostate: Secondary | ICD-10-CM | POA: Insufficient documentation

## 2019-06-30 DIAGNOSIS — N138 Other obstructive and reflux uropathy: Secondary | ICD-10-CM | POA: Diagnosis not present

## 2019-06-30 LAB — BLADDER SCAN AMB NON-IMAGING: Scan Result: 0

## 2019-06-30 NOTE — Progress Notes (Signed)
   06/30/2019 11:07 AM   [name] 08-21-1934 [id number]  Reason for visit: Follow up BPH and urinary retention, prostate cancer  HPI: Mr. Dubree is a 85 year old male with number of comorbidities including Lewy body dementia who originally presented to me in the fall 2020 with Foley dependent urinary retention.  He underwent a HOLEP with removal of 33 g of tissue showing <5% involvement with prostate adenocarcinoma Gleason score 4+3=7.  PSA preop was 2.7, and postop was 0.5.  He denies any problems since we saw him last.  He continues to void with a strong stream and denies any incontinence, gross hematuria, or other urinary problems.  PVR in clinic today 0 mL.  Repeat PSA 06/21/2019 is stable at 0.5 from 0.5 postop 6 months ago.  We reviewed these reassuring findings at length.  We discussed the importance of ongoing PSA monitoring, however if PSA is unchanged in 1 year, could consider discontinuing monitoring with his comorbidities.  We discussed return precautions at length.  RTC 1 year with PSA prior, PVR  I spent 30 total minutes on the day of the encounter including pre-visit review of the medical record, face-to-face time with the patient, and post visit ordering of labs/imaging/tests.  [name], [facility] [address] [place], [place] [address] [phone]

## 2019-07-04 ENCOUNTER — Other Ambulatory Visit: Payer: Self-pay | Admitting: Family Medicine

## 2019-07-04 DIAGNOSIS — I10 Essential (primary) hypertension: Secondary | ICD-10-CM

## 2019-07-07 ENCOUNTER — Telehealth: Payer: Self-pay | Admitting: Family Medicine

## 2019-07-07 ENCOUNTER — Ambulatory Visit: Payer: Self-pay | Admitting: Pharmacist

## 2019-07-07 DIAGNOSIS — R413 Other amnesia: Secondary | ICD-10-CM

## 2019-07-07 DIAGNOSIS — I10 Essential (primary) hypertension: Secondary | ICD-10-CM

## 2019-07-07 NOTE — Chronic Care Management (AMB) (Signed)
  Chronic Care Management   Note  07/07/2019 Name: Randy [name] MRN: [id number] DOB: [birth year]  Randy [name] is a 85 y.o. year old male who is a primary care patient of [name], DO and is actively engaged with the care management team. I reached out to Ernesto Rutherford by phone today to assist with re-scheduling a follow up visit with the RN Case Manager.  Follow up plan: Telephone appointment with care management team member scheduled for: 08/09/2019  [place], [place], [place] [id number] Direct Dial: [email or web address] Website: [email or web address]

## 2019-07-07 NOTE — Patient Instructions (Signed)
Thank you allowing the Chronic Care Management Team to be a part of your care! It was a pleasure speaking with you today!     CCM (Chronic Care Management) Team    Noreene Larsson RN, MSN, CCM Nurse Care Coordinator  [phone]   [name] PharmD  Clinical Pharmacist  [phone]   Eula Fried LCSW Clinical Social Worker [phone]  Visit Information  Goals Addressed            This Visit's Progress   . PharmD - Medication Management       CARE PLAN ENTRY (see longitudinal plan of care for additional care plan information)  Current Barriers:    . Cognitive Deficits  Pharmacist Clinical Goal(s):  [name] Over the next 30 days, patient/caregiver will work with CM Pharmacist to address needs related to medication management  Interventions: . Follow up regarding blood pressure monitoring and control ? Unable to review specific BP readings today ? Daughter states that she will send these to me this week ? Reports recent readings running: 120-150s/70-80s ? Reports patient still has a little swelling on and off in his legs.  ? Note patient not recently using compression stockings due to warmer weather, but notes able to reapply  ? Remind to use furosemide as needed as directed by PCP ? Encourage to aid patient with continuing to check, keep log and contact office for readings outside of provided parameters or new symptoms . Discuss strategies to aid with medication adherence ? Reports patient having difficulty with remembering fourth dose of carbidopa/levodopa.  ? Note patient using weekly pillbox with slots for four times daily dosing ? Encourage to consider using medication alarm to help patient remember throughout the day . Encourage caregiver to schedule patient for visit with PCP for follow up regarding knee pain  Patient Self Care Activities:  . Patient to take medications as directed with assistance of daughter o Daughter aids Mr. Crist with filling his weekly  pillbox and monitoring for adherence . Attends all scheduled provider appointments . Calls pharmacy for medication refills . Calls provider office for new concerns or questions . Patient/daughter to keep log of BP readings   Please see past updates related to this goal by clicking on the "Past Updates" button in the selected goal         Patient verbalizes understanding of instructions provided today.   The care management team will reach out to the patient again over the next 30 days.   [name], PharmD, [place] [employer] [phone]

## 2019-07-07 NOTE — Chronic Care Management (AMB) (Signed)
Chronic Care Management   Follow Up Note   07/07/2019 Name: Randy [name] MRN: [id number] DOB: 09/21/1934  Referred by: [name], DO Reason for referral : Chronic Care Management (Caregiver Phone Call)   Randy [name] is a 85 y.o. year old male who is a primary care patient of [name], DO. The CCM team was consulted for assistance with chronic disease management and care coordination needs. Mr. Martelle has a past medical history including, but not limited to: hypertension, osteoarthritis, hyperlipidemia, insomnia and memory loss.  I reached out to patient's daughter/caregiver, [name], by phone today.    Review of patient status, including review of consultants reports, relevant laboratory and other test results, and collaboration with appropriate care team members and the patient's provider was performed as part of comprehensive patient evaluation and provision of chronic care management services.     Outpatient Encounter Medications as of 07/07/2019  Medication Sig  . carbidopa-levodopa (SINEMET IR) 25-100 MG tablet Take 1 tablet by mouth 4 (four) times daily.   . enalapril (VASOTEC) 20 MG tablet TAKE 1 TABLET BY MOUTH TWICE A DAY  . cholecalciferol (VITAMIN D) 1000 units tablet Take 1,000 Units by mouth daily.  . Coenzyme Q10 (CO Q 10) 100 MG CAPS Take 1 capsule by mouth daily.   . diclofenac Sodium (VOLTAREN) 1 % GEL APPLY 2 G TOPICALLY 3 (THREE) TIMES DAILY AS NEEDED. TO KNEE ARTHRITIS PAIN  . fluticasone (FLONASE) 50 MCG/ACT nasal spray Place 2 sprays into both nostrils daily as needed for allergies.   . furosemide (LASIX) 20 MG tablet Take 1 tablet (20 mg total) by mouth every other day.  [name] MELATONIN PO Take 1 tablet by mouth at bedtime.  . Omega-3 Fatty Acids (FISH OIL) 1200 MG CAPS Take 1,200 mg by mouth 2 (two) times daily.   [name] omeprazole (PRILOSEC) 20 MG capsule Take 20 mg by mouth daily.  . vitamin C (ASCORBIC ACID) 500 MG tablet Take 500 mg by  mouth daily.   No facility-administered encounter medications on file as of 07/07/2019.    Goals Addressed            This Visit's Progress   . PharmD - Medication Management       CARE PLAN ENTRY (see longitudinal plan of care for additional care plan information)  Current Barriers:    . Cognitive Deficits  Pharmacist Clinical Goal(s):  [name] Over the next 30 days, patient/caregiver will work with CM Pharmacist to address needs related to medication management  Interventions: . Follow up regarding blood pressure monitoring and control ? Unable to review specific BP readings today ? Daughter states that she will send these to me this week ? Reports recent readings running: 120-150s/70-80s ? Reports patient still has a little swelling on and off in his legs.  ? Note patient not recently using compression stockings due to warmer weather, but notes able to reapply  ? Remind to use furosemide as needed as directed by PCP ? Encourage to aid patient with continuing to check, keep log and contact office for readings outside of provided parameters or new symptoms . Discuss strategies to aid with medication adherence ? Reports patient having difficulty with remembering fourth dose of carbidopa/levodopa.  ? Note patient using weekly pillbox with slots for four times daily dosing ? Encourage to consider using medication alarm to help patient remember throughout the day . Encourage caregiver to schedule patient for visit with PCP for follow up regarding  knee pain  Patient Self Care Activities:  . Patient to take medications as directed with assistance of daughter o Daughter aids Mr. Ballo with filling his weekly pillbox and monitoring for adherence . Attends all scheduled provider appointments . Calls pharmacy for medication refills . Calls provider office for new concerns or questions . Patient/daughter to keep log of BP readings   Please see past updates related to this goal by clicking  on the "Past Updates" button in the selected goal         Plan  The care management team will reach out to the patient again over the next 30 days.   [name], PharmD, [place] [employer] [phone]

## 2019-07-09 ENCOUNTER — Telehealth: Payer: Self-pay

## 2019-07-15 ENCOUNTER — Ambulatory Visit (INDEPENDENT_AMBULATORY_CARE_PROVIDER_SITE_OTHER): Payer: Medicare Other | Admitting: Licensed Clinical Social Worker

## 2019-07-15 DIAGNOSIS — I10 Essential (primary) hypertension: Secondary | ICD-10-CM

## 2019-07-15 DIAGNOSIS — E782 Mixed hyperlipidemia: Secondary | ICD-10-CM | POA: Diagnosis not present

## 2019-07-15 DIAGNOSIS — R296 Repeated falls: Secondary | ICD-10-CM

## 2019-07-15 DIAGNOSIS — R413 Other amnesia: Secondary | ICD-10-CM

## 2019-07-15 DIAGNOSIS — K219 Gastro-esophageal reflux disease without esophagitis: Secondary | ICD-10-CM

## 2019-07-16 ENCOUNTER — Ambulatory Visit: Payer: Self-pay | Admitting: Pharmacist

## 2019-07-16 DIAGNOSIS — I10 Essential (primary) hypertension: Secondary | ICD-10-CM

## 2019-07-16 NOTE — Chronic Care Management (AMB) (Signed)
Chronic Care Management    Clinical Social Work Follow Up Note  07/16/2019 Name: Randy [name] MRN: [id number] DOB: 03-Jul-1934  Randy [name] is a 85 y.o. year old male who is a primary care patient of Randy Hauser, DO. The CCM team was consulted for assistance with Randy [name] .   Review of patient status, including review of consultants reports, other relevant assessments, and collaboration with appropriate care team members and the patient's provider was performed as part of comprehensive patient evaluation and provision of chronic care management services.    SDOH (Social Determinants of Health) assessments performed: Yes    Outpatient Encounter Medications as of 07/15/2019  Medication Sig  . carbidopa-levodopa (SINEMET IR) 25-100 MG tablet Take 1 tablet by mouth 4 (four) times daily.   . cholecalciferol (VITAMIN D) 1000 units tablet Take 1,000 Units by mouth daily.  . Coenzyme Q10 (CO Q 10) 100 MG CAPS Take 1 capsule by mouth daily.   . diclofenac Sodium (VOLTAREN) 1 % GEL APPLY 2 G TOPICALLY 3 (THREE) TIMES DAILY AS NEEDED. TO KNEE ARTHRITIS PAIN  . enalapril (VASOTEC) 20 MG tablet TAKE 1 TABLET BY MOUTH TWICE A DAY  . fluticasone (FLONASE) 50 MCG/ACT nasal spray Place 2 sprays into both nostrils daily as needed for allergies.   . furosemide (LASIX) 20 MG tablet Take 1 tablet (20 mg total) by mouth every other day.  [name] MELATONIN PO Take 1 tablet by mouth at bedtime.  . Omega-3 Fatty Acids (FISH OIL) 1200 MG CAPS Take 1,200 mg by mouth 2 (two) times daily.   [name] omeprazole (PRILOSEC) 20 MG capsule Take 20 mg by mouth daily.  . vitamin C (ASCORBIC ACID) 500 MG tablet Take 500 mg by mouth daily.   No facility-administered encounter medications on file as of 07/15/2019.     Goals Addressed    .  "I'm having a hard time managing my health" (pt-stated)        Current Barriers:  . Financial constraints . Level of care concerns . ADL IADL limitations . Social  Isolation . Limited access to caregiver . Memory Deficits . Lacks knowledge of community resource: personal care service resources within the area that may benefit patient . Lack of socialization   Clinical Social Work Clinical Goal(s):  [name] Over the next 90 days, client will work with SW to address concerns related to lack of care/support within the home  Interventions: . Patient interviewed and appropriate assessments performed . LCSW spoke with patient, patient's spouse and patient's daughter on 07/16/19. Patient has been struggling with fatigue and dizziness. . Provided patient and family with information about personal care service resources within the area. Patient reports that he lives with his spouse and daughter and that his daughter is his main support and assist him with transportation/meal prep/daily needs.  . Discussed plans with patient for ongoing care management follow up and provided patient with direct contact information for care management team . Advised patient to contact CCM team with any urgent concerns . Assisted patient/caregiver with obtaining information about health plan benefits . Provided education and assistance to client regarding Advanced Directives. . Provided education to patient/caregiver regarding level of care options. [name] LCSW provided self-care education. Patient has ongoing issue with memory and sleep. Patient was encouraged to take medications as prescribed and to ask for help (daughter or spouse) when needed and to try to check blood pressure and record it once a day per PCP's recommendation. Family continue to  encourage patient to keep up with blood pressure recordings.  . Patient has been having pain and discomfort in his right knee which has affected his mobility. PT has now ended. Patient has been wearing his compression stockings. . Patient completed a neurologist appointment on 05/31/19 which went well.  . Daughter/patient reports pt is still taking  melatonin dose of 10mg  per night but still struggles with insomnia. . Daughter/patient declined needing MOW's at this time.  . Patient admits to feeling lonely at times due to isolation related to COVID-19. LCSW provided family with community resource education for socialization ([place]) Family wish for LCSW to mail this information to their address. LCSW sent out resources on 07/16/19. Emotional support and coping skill education provided as well. Patient was very receptive to this information.   Patient Self Care Activities:  . Attends all scheduled provider appointments . Calls provider office for new concerns or questions  Please see past updates related to this goal by clicking on the "Past Updates" button in the selected goal       Follow Up Plan: SW will follow up with patient by phone over the next quarter  Randy [name], [place], MSW, [name] Phone: [phone]

## 2019-07-16 NOTE — Patient Instructions (Signed)
Thank you allowing the Chronic Care Management Team to be a part of your care! It was a pleasure speaking with you today!     CCM (Chronic Care Management) Team    Noreene Larsson RN, MSN, CCM Nurse Care Coordinator  [phone]   [name] PharmD  Clinical Pharmacist  [phone]   Eula Fried LCSW Clinical Social Worker [phone]  Visit Information  Goals Addressed            This Visit's Progress   . PharmD - Medication Management       CARE PLAN ENTRY (see longitudinal plan of care for additional care plan information)  Current Barriers:    . Cognitive Deficits  Pharmacist Clinical Goal(s):  [name] Over the next 30 days, patient/caregiver will work with CM Pharmacist to address needs related to medication management  Interventions: . Follow up regarding blood pressure monitoring and control ? BP results not received from daughter as planned last week ? Daughter states that she will send these this evening . Discuss strategies to aid with medication adherence ? Reports improvement in patient remembering four times daily dosing of carbidopa/levodopa with help of reminders from his spouse.  ? Note patient using weekly pillbox with slots for four times daily dosing ? Also considering using medication alarm to help patient remember throughout the day . Again encourage caregiver to schedule patient for visit with PCP for follow up regarding knee pain  Patient Self Care Activities:  . Patient to take medications as directed with assistance of daughter o Daughter aids Mr. Burnside with filling his weekly pillbox and monitoring for adherence . Attends all scheduled provider appointments . Calls pharmacy for medication refills . Calls provider office for new concerns or questions . Patient/daughter to keep log of BP readings   Please see past updates related to this goal by clicking on the "Past Updates" button in the selected goal         Patient verbalizes  understanding of instructions provided today.   The care management team will reach out to the patient again over the next 30 days.   [name], PharmD, [place] [employer] [phone]

## 2019-07-16 NOTE — Chronic Care Management (AMB) (Signed)
Chronic Care Management   Follow Up Note   07/16/2019 Name: Randy [name] MRN: [id number] DOB: Feb 13, 1934  Referred by: [name], DO Reason for referral : Chronic Care Management (Patient Phone Call)   Randy [name] is a 85 y.o. year old male who is a primary care patient of [name], DO. The CCM team was consulted for assistance with chronic disease management and care coordination needs. Randy [name] has a past medical history including, but not limited to: hypertension, osteoarthritis, hyperlipidemia, insomnia and memory loss.  I reached out to patient's daughter/caregiver, [name], by phone today.    Review of patient status, including review of consultants reports, relevant laboratory and other test results, and collaboration with appropriate care team members and the patient's provider was performed as part of comprehensive patient evaluation and provision of chronic care management services.     Outpatient Encounter Medications as of 07/16/2019  Medication Sig  . carbidopa-levodopa (SINEMET IR) 25-100 MG tablet Take 1 tablet by mouth 4 (four) times daily.   . cholecalciferol (VITAMIN D) 1000 units tablet Take 1,000 Units by mouth daily.  . Coenzyme Q10 (CO Q 10) 100 MG CAPS Take 1 capsule by mouth daily.   . diclofenac Sodium (VOLTAREN) 1 % GEL APPLY 2 G TOPICALLY 3 (THREE) TIMES DAILY AS NEEDED. TO KNEE ARTHRITIS PAIN  . enalapril (VASOTEC) 20 MG tablet TAKE 1 TABLET BY MOUTH TWICE A DAY  . fluticasone (FLONASE) 50 MCG/ACT nasal spray Place 2 sprays into both nostrils daily as needed for allergies.   . furosemide (LASIX) 20 MG tablet Take 1 tablet (20 mg total) by mouth every other day.  [name] MELATONIN PO Take 1 tablet by mouth at bedtime.  . Omega-3 Fatty Acids (FISH OIL) 1200 MG CAPS Take 1,200 mg by mouth 2 (two) times daily.   [name] omeprazole (PRILOSEC) 20 MG capsule Take 20 mg by mouth daily.  . vitamin C (ASCORBIC ACID) 500 MG tablet Take 500 mg by  mouth daily.   No facility-administered encounter medications on file as of 07/16/2019.    Goals Addressed            This Visit's Progress   . PharmD - Medication Management       CARE PLAN ENTRY (see longitudinal plan of care for additional care plan information)  Current Barriers:    . Cognitive Deficits  Pharmacist Clinical Goal(s):  [name] Over the next 30 days, patient/caregiver will work with CM Pharmacist to address needs related to medication management  Interventions: . Follow up regarding blood pressure monitoring and control ? BP results not received from daughter as planned last week ? Daughter states that she will send these this evening . Discuss strategies to aid with medication adherence ? Reports improvement in patient remembering four times daily dosing of carbidopa/levodopa with help of reminders from his spouse.  ? Note patient using weekly pillbox with slots for four times daily dosing ? Also considering using medication alarm to help patient remember throughout the day . Again encourage caregiver to schedule patient for visit with PCP for follow up regarding knee pain  Patient Self Care Activities:  . Patient to take medications as directed with assistance of daughter o Daughter aids Randy [name] with filling his weekly pillbox and monitoring for adherence . Attends all scheduled provider appointments . Calls pharmacy for medication refills . Calls provider office for new concerns or questions . Patient/daughter to keep log of BP readings  Please see past updates related to this goal by clicking on the "Past Updates" button in the selected goal         Plan  The care management team will reach out to the patient again over the next 30 days.   [name], PharmD, [place] [employer] [phone]

## 2019-07-19 ENCOUNTER — Ambulatory Visit: Payer: Medicare Other | Admitting: Family Medicine

## 2019-07-19 ENCOUNTER — Encounter: Payer: Self-pay | Admitting: Family Medicine

## 2019-07-19 VITALS — BP 150/75 | HR 65 | Temp 97.5°F | Resp 16 | Ht 72.0 in | Wt 240.6 lb

## 2019-07-19 DIAGNOSIS — R296 Repeated falls: Secondary | ICD-10-CM

## 2019-07-19 DIAGNOSIS — R531 Weakness: Secondary | ICD-10-CM

## 2019-07-19 DIAGNOSIS — M1711 Unilateral primary osteoarthritis, right knee: Secondary | ICD-10-CM

## 2019-07-19 DIAGNOSIS — M1712 Unilateral primary osteoarthritis, left knee: Secondary | ICD-10-CM | POA: Diagnosis not present

## 2019-07-19 DIAGNOSIS — M25561 Pain in right knee: Secondary | ICD-10-CM | POA: Diagnosis not present

## 2019-07-19 DIAGNOSIS — G8929 Other chronic pain: Secondary | ICD-10-CM

## 2019-07-19 DIAGNOSIS — M25562 Pain in left knee: Secondary | ICD-10-CM

## 2019-07-19 MED ORDER — LIDOCAINE HCL (PF) 1 % IJ SOLN
4.0000 mL | Freq: Once | INTRAMUSCULAR | Status: AC
  Administered 2019-07-19: 4 mL

## 2019-07-19 MED ORDER — DICLOFENAC SODIUM 1 % EX GEL: 2.0000 g | Freq: Four times a day (QID) | CUTANEOUS | 3 refills | Status: DC | PRN

## 2019-07-19 MED ORDER — METHYLPREDNISOLONE ACETATE 40 MG/ML IJ SUSP
40.0000 mg | Freq: Once | INTRAMUSCULAR | Status: AC
  Administered 2019-07-19: 40 mg via INTRA_ARTICULAR

## 2019-07-19 NOTE — Patient Instructions (Addendum)
Thank you for coming to the office today.  You received a Left AND Right Knee Joint steroid injection today. - Lidocaine numbing medicine may ease the pain initially for a few hours until it wears off - As discussed, you may experience a "steroid flare" this evening or within 24-48 hours, anytime medicine is injected into an inflamed joint it can cause the pain to get worse temporarily - Everyone responds differently to these injections, it depends on the patient and the severity of the joint problem, it may provide anywhere from days to weeks, to months of relief. Ideal response is >6 months relief - Try to take it easy for next 1-2 days, avoid over activity and strain on joint (limit walking for knee) - Recommend the following:   - For swelling - rest, compression sleeve / ACE wrap, elevation, and ice packs as needed for first few days   - For pain in future may use heating pad or moist heat as needed  Increase use of Diclofenac (Voltaren) topical gel - use up to 4 times a day on each knee. New order.   [place]:  [facility]) [place] [address]  Hours: Monday - Sunday 8:00am to 12:00pm  COVID-19 Vaccines By Appointment Only  Sign up for [place] List  [email or web address] or text "VACCINE" to [phone] or call [phone]   Please schedule a Follow-up Appointment to: Return in about 3 months (around 10/19/2019), or if symptoms worsen or fail to improve, for arthritis knee pain, swelling.  If you have any other questions or concerns, please feel free to call the office or send a message through [place]. You may also schedule an earlier appointment if necessary.  Additionally, you may be receiving a survey about your experience at our office within a few days to 1 week by e-mail or mail. We value your feedback.  Randy Putnam, DO [place]

## 2019-07-19 NOTE — Progress Notes (Addendum)
Subjective:    Patient ID: Randy [name], male    DOB: Mar 27, 1934, 85 y.o.   MRN: [id number]  Randy [name] is a 85 y.o. male presenting on 07/19/2019 for Knee Pain   HPI   Follow-up Osteoarthritis Bilateral Knees, Bilateral Knee pain (Right > Left) - Last visit with me 01/2019, for same problem, treated with LEFT only knee steroid injection, see prior notes for background information. - Interval update with good results in past with several month relief from prior injection, last injection R knee was in 09/2018, almost 1 year - Today patient reports gradual worsening bilateral knee pain, R>L, has stiffness soreness swelling pain, worse with ambulation, uses cane. Using Topical voltaren diclofenac PRN with some relief only using 1-2 x daily not using up to 3-4 times a day - He uses cane / walker at times - he has not had any new injury or fall - he has not seen orthopedic for this S/p 2 injections R knee steroid, last 09/2019 S/p 1 injection L knee steroid, 01/2019 Admits LE edema, had stopped lasix for a while now has resumed 20mg  QOD - starting today, M-W-F dosing now Denies redness swelling numbness tingling  Lasix stopped for while, then resumed today QOD MWF  Health Maintenance: Not updated on COVID19 vaccine, he will check into pharmacy to get this.  Depression screen [facility] 2/9 03/02/2019 01/22/2019 09/21/2018  Decreased Interest 0 0 0  Down, Depressed, Hopeless 0 0 0  PHQ - 2 Score 0 0 0    Social History   Tobacco Use  . Smoking status: Former Smoker    Packs/day: 1.00    Years: 12.00    Pack years: 12.00    Types: Cigarettes    Quit date: 1968    Years since quitting: 53.5  . Smokeless tobacco: Never Used  Vaping Use  . Vaping Use: Never used  Substance Use Topics  . Alcohol use: No  . Drug use: No    Review of Systems Per HPI unless specifically indicated above     Objective:    BP (!) 150/75   Pulse 65   Temp (!) 97.5 F (36.4 C) (Temporal)   Resp 16   Ht  6' (1.829 m)   Wt 240 lb 9.6 oz (109.1 kg)   SpO2 99%   BMI 32.63 kg/m   Wt Readings from Last 3 Encounters:  07/19/19 240 lb 9.6 oz (109.1 kg)  06/30/19 238 lb (108 kg)  01/22/19 235 lb (106.6 kg)    Physical Exam Vitals and nursing note reviewed.  Constitutional:      General: He is not in acute distress.    Appearance: He is well-developed. He is not diaphoretic.     Comments: Well-appearing, comfortable, cooperative  HENT:     Head: Normocephalic and atraumatic.  Eyes:     General:        Right eye: No discharge.        Left eye: No discharge.     Conjunctiva/sclera: Conjunctivae normal.  Neck:     Thyroid: No thyromegaly.  Cardiovascular:     Rate and Rhythm: Normal rate and regular rhythm.     Heart sounds: Normal heart sounds. No murmur heard.   Pulmonary:     Effort: Pulmonary effort is normal. No respiratory distress.     Breath sounds: Normal breath sounds. No wheezing or rales.  Musculoskeletal:        General: Normal range of motion.  Cervical back: Normal range of motion and neck supple.     Right lower leg: Edema (+2 pitting edema lower ext) present.     Left lower leg: Edema (+2 pitting edema lower ext) present.     Comments: Bilateral knees, bulky appearance, crepitus present, with some pain and stiffness on range of motion. Limited strength testing today.  Uses cane, able to stand from seated with assistance.  Lymphadenopathy:     Cervical: No cervical adenopathy.  Skin:    General: Skin is warm and dry.     Findings: No erythema or rash.  Neurological:     Mental Status: He is alert and oriented to person, place, and time.  Psychiatric:        Behavior: Behavior normal.     Comments: Well groomed, good eye contact, normal speech and thoughts      ________________________________________________________ PROCEDURE NOTE Date: 07/19/19 Right and Left Knee corticosteroid injection (x 2 injections total) Discussed benefits and risks (including  pain, bleeding, infection, steroid flare). Verbal consent given by patient. Medication:  PER KNEE = 1 cc Depo-medrol 40mg  and 4 cc Lidocaine 1% without epi Time Out taken  Landmarks identified. Area cleansed with alcohol wipes.Using 21 gauge and 1, 1/2 inch needle, Right/Left knee, joint space was injected (with above listed medication) via medial approach cold spray used for superficial anesthetic.Sterile bandage placed.Patient tolerated procedure well without bleeding or paresthesias.No complications. Each injection was performed today, one on Right first followed by left  I have personally reviewed the radiology report :  CLINICAL DATA:  Acute on chronic left knee pain.  EXAM: LEFT KNEE - COMPLETE 4+ VIEW; BILATERAL KNEES STANDING - 1 VIEW  COMPARISON:  None.  FINDINGS: Both knees reveal subjective adequate mineralization. The weight-bearing AP views of both knees reveal preservation of the medial and lateral joint spaces on the right. On the left however there is moderate lateral compartment joint space loss. There is beaking of the medial tibial spine on the left. There is minimal chondrocalcinosis of the menisci. Spurs arise from the articular margins of the patella. There are enthesophytes at the insertion of the quadriceps tendon and origin of the patellar tendon. There is prominence of the tibial tuberosity. There is no joint effusion. There is no acute fracture.  IMPRESSION: Moderate osteoarthritic changes of the lateral and patellofemoral compartments of the left knee with milder changes of the medial compartment. No acute fracture nor dislocation.   Electronically Signed   By: David  Martinique M.D.   On: 11/03/2017 07:40    Results for orders placed or performed in visit on 06/30/19  BLADDER SCAN AMB NON-IMAGING  Result Value Ref Range   Scan Result 0 ml       Assessment & Plan:   Problem List Items Addressed This Visit    Knee pain, chronic    Relevant Medications   diclofenac Sodium (VOLTAREN) 1 % GEL    Other Visit Diagnoses    Primary osteoarthritis of right knee    -  Primary   Relevant Medications   diclofenac Sodium (VOLTAREN) 1 % GEL   Primary osteoarthritis of left knee       Relevant Medications   diclofenac Sodium (VOLTAREN) 1 % GEL   General weakness       Recurrent falls          Persistent chronicBILATERALmedialKnee pain Increase risk of falls, recurrent / generalized weakness due to pain and other co morbid problems  Today RIGHT worse than  Left Improved from steroid injections Last injection L knee 01/2019 (2nd injection), Last injection R knee (09/2018, 2nd injection) Complicated by swelling of lower extremity now back on diuretic lasix Without known injury or trauma Known OA DJD in other joints, L knee tricompartmental OA see prior x-ray - Able to bear weight, no mechanical locking - No prior history of knee surgery, arthroscopy  Plan: BILATERAL knee steroid injection performed today, see above procedure note INCREASE topical diclofenac nsaid PRN - was using only 1-2 x daily now should use 3-4 times a day Continue Tylenol 500-1000mg  per dose TID PRN breakthrough RICE therapy Future if not improvingwill referOrthopedic secondary opinion - consider repeat injections in future effectiveq 3-6 months  #Edema / HTN Resume lasix 20mg  QOD for now M-W-F dosing, may monitor BP closer if persistent >140-150 range SBP then will need follow up - but advised likely with inc edema and knee pain - Note he had dizziness on HCTZ  Meds ordered this encounter  Medications  . diclofenac Sodium (VOLTAREN) 1 % GEL    Sig: Apply 2 g topically 4 (four) times daily as needed. To knee arthritis pain    Dispense:  100 g    Refill:  3  . lidocaine (PF) (XYLOCAINE) 1 % injection 4 mL  . methylPREDNISolone acetate (DEPO-MEDROL) injection 40 mg  . lidocaine (PF) (XYLOCAINE) 1 % injection 4 mL  . methylPREDNISolone  acetate (DEPO-MEDROL) injection 40 mg    Follow up plan: Return in about 3 months (around 10/19/2019), or if symptoms worsen or fail to improve, for arthritis knee pain, swelling.   [name], [facility] 07/19/2019, 4:28 PM

## 2019-07-21 ENCOUNTER — Ambulatory Visit: Payer: Self-pay | Admitting: Pharmacist

## 2019-07-21 DIAGNOSIS — I10 Essential (primary) hypertension: Secondary | ICD-10-CM

## 2019-07-21 NOTE — Chronic Care Management (AMB) (Signed)
Chronic Care Management   Follow Up Note   07/21/2019 Name: Randy [name] MRN: [id number] DOB: 29-Nov-1934  Referred by: [name], DO Reason for referral : Care Coordination   ARSH FEUTZ is a 85 y.o. year old male who is a primary care patient of [name], DO. The CCM team was consulted for assistance with chronic disease management and care coordination needs.    Receive message from patient's daughter/caregiver with patient's blood pressure log results.  Perform review of chart.  Review of patient status, including review of consultants reports, relevant laboratory and other test results, and collaboration with appropriate care team members and the patient's provider was performed as part of comprehensive patient evaluation and provision of chronic care management services.     Outpatient Encounter Medications as of 07/21/2019  Medication Sig  . carbidopa-levodopa (SINEMET IR) 25-100 MG tablet Take 1 tablet by mouth 4 (four) times daily.   . cholecalciferol (VITAMIN D) 1000 units tablet Take 1,000 Units by mouth daily.  . Coenzyme Q10 (CO Q 10) 100 MG CAPS Take 1 capsule by mouth daily.   . diclofenac Sodium (VOLTAREN) 1 % GEL Apply 2 g topically 4 (four) times daily as needed. To knee arthritis pain  . enalapril (VASOTEC) 20 MG tablet TAKE 1 TABLET BY MOUTH TWICE A DAY  . fluticasone (FLONASE) 50 MCG/ACT nasal spray Place 2 sprays into both nostrils daily as needed for allergies.   . furosemide (LASIX) 20 MG tablet Take 1 tablet (20 mg total) by mouth every other day.  [name] MELATONIN PO Take 1 tablet by mouth at bedtime.  . Omega-3 Fatty Acids (FISH OIL) 1200 MG CAPS Take 1,200 mg by mouth 2 (two) times daily.   [name] omeprazole (PRILOSEC) 20 MG capsule Take 20 mg by mouth daily.  . vitamin C (ASCORBIC ACID) 500 MG tablet Take 500 mg by mouth daily.   No facility-administered encounter medications on file as of 07/21/2019.    Goals Addressed             This Visit's Progress   . PharmD - Medication Management       CARE PLAN ENTRY (see longitudinal plan of care for additional care plan information)  Current Barriers:    . Cognitive Deficits  Pharmacist Clinical Goal(s):  [name] Over the next 30 days, patient/caregiver will work with CM Pharmacist to address needs related to medication management  Interventions: . Receive BP readings from patient's daughter/caregiver:  AM Blood Pressure PM Blood Pressure  1 - July 142/89, HR 60 157/88  2 - July  142/93, HR 68  3 - July    4 - July  139/80, HR 71  5 - July    6 - July    7 - July  146/88, HR 65  8 - July  123/72, HR 68  9 - July 141/93, HR 59   10 - July 134/93, HR 59    . Perform chart review - Patient seen by PCP on 7/12 for knee pain o Patient received bilateral knee steroid injection o Patient advised to: - Increase topical diclofenac prn to 3-4 times/day - Continue acetaminophen 500-1000mg  per dose TID PRN breakthrough - Use RICE therapy o Regarding hypertension management, patient advised to: - Resume Lasix 20 mg QOD for now M-W-F dosing, monitor and follow up if SBP persistent >140-150 range  Patient Self Care Activities:  . Patient to take medications as directed with assistance of daughter o Daughter  aids Mr. Ines with filling his weekly pillbox and monitoring for adherence . Attends all scheduled provider appointments . Calls pharmacy for medication refills . Calls provider office for new concerns or questions . Patient/daughter to keep log of BP readings   Please see past updates related to this goal by clicking on the "Past Updates" button in the selected goal         Plan  The care management team will reach out to the patient again over the next 30 days.   [name], PharmD, [place] [employer] [phone]

## 2019-07-26 ENCOUNTER — Telehealth: Payer: Self-pay

## 2019-07-27 ENCOUNTER — Ambulatory Visit: Payer: Self-pay | Admitting: Licensed Clinical Social Worker

## 2019-07-27 DIAGNOSIS — E782 Mixed hyperlipidemia: Secondary | ICD-10-CM

## 2019-07-27 DIAGNOSIS — R413 Other amnesia: Secondary | ICD-10-CM

## 2019-07-27 DIAGNOSIS — I10 Essential (primary) hypertension: Secondary | ICD-10-CM

## 2019-07-27 DIAGNOSIS — R296 Repeated falls: Secondary | ICD-10-CM

## 2019-07-27 DIAGNOSIS — K219 Gastro-esophageal reflux disease without esophagitis: Secondary | ICD-10-CM

## 2019-07-28 NOTE — Chronic Care Management (AMB) (Signed)
Chronic Care Management    Clinical Social Work Follow Up Note  07/28/2019 Name: Randy [name] MRN: [id number] DOB: 01-Aug-1934  Randy [name] is a 85 y.o. year old male who is a primary care patient of [name], DO. The CCM team was consulted for assistance with [employer] .   Review of patient status, including review of consultants reports, other relevant assessments, and collaboration with appropriate care team members and the patient's provider was performed as part of comprehensive patient evaluation and provision of chronic care management services.    SDOH (Social Determinants of Health) assessments performed: Yes    Outpatient Encounter Medications as of 07/27/2019  Medication Sig  . carbidopa-levodopa (SINEMET IR) 25-100 MG tablet Take 1 tablet by mouth 4 (four) times daily.   . cholecalciferol (VITAMIN D) 1000 units tablet Take 1,000 Units by mouth daily.  . Coenzyme Q10 (CO Q 10) 100 MG CAPS Take 1 capsule by mouth daily.   . diclofenac Sodium (VOLTAREN) 1 % GEL Apply 2 g topically 4 (four) times daily as needed. To knee arthritis pain  . enalapril (VASOTEC) 20 MG tablet TAKE 1 TABLET BY MOUTH TWICE A DAY  . fluticasone (FLONASE) 50 MCG/ACT nasal spray Place 2 sprays into both nostrils daily as needed for allergies.   . furosemide (LASIX) 20 MG tablet Take 1 tablet (20 mg total) by mouth every other day.  [name] MELATONIN PO Take 1 tablet by mouth at bedtime.  . Omega-3 Fatty Acids (FISH OIL) 1200 MG CAPS Take 1,200 mg by mouth 2 (two) times daily.   [name] omeprazole (PRILOSEC) 20 MG capsule Take 20 mg by mouth daily.  . vitamin C (ASCORBIC ACID) 500 MG tablet Take 500 mg by mouth daily.   No facility-administered encounter medications on file as of 07/27/2019.     Goals Addressed    .  "I'm having a hard time managing my health" (pt-stated)        Current Barriers:  . Financial constraints . Level of care concerns . ADL IADL limitations . Social  Isolation . Limited access to caregiver . Memory Deficits . Lacks knowledge of community resource: personal care service resources within the area that may benefit patient . Lack of socialization   Clinical Social Work Clinical Goal(s):  [name] Over the next 90 days, client will work with SW to address concerns related to lack of care/support within the home  Interventions: . Patient interviewed and appropriate assessments performed . LCSW spoke with patient, patient's spouse and patient's daughter on 07/16/19. Patient has been struggling with fatigue and dizziness. . Provided patient and family with information about personal care service resources within the area. Patient reports that he lives with his spouse and daughter and that his daughter is his main support and assist him with transportation/meal prep/daily needs.  . Discussed plans with patient for ongoing care management follow up and provided patient with direct contact information for care management team . Advised patient to contact CCM team with any urgent concerns . Assisted patient/caregiver with obtaining information about health plan benefits . Provided education and assistance to client regarding Advanced Directives. . Provided education to patient/caregiver regarding level of care options. [name] LCSW provided self-care education. Patient has ongoing issue with memory and sleep. Patient was encouraged to take medications as prescribed and to ask for help (daughter or spouse) when needed and to try to check blood pressure and record it once a day per PCP's recommendation. Family continue to  encourage patient to keep up with blood pressure recordings.  . Patient has been having pain and discomfort in his right knee which has affected his mobility. PT has now ended. Patient has been wearing his compression stockings. . Patient completed a neurologist appointment on 05/31/19 which went well.  . Daughter/patient reports pt is still taking  melatonin dose of 10mg  per night but still struggles with insomnia. . Daughter/patient declined needing MOW's at this time.  . Patient admits to feeling lonely at times due to isolation related to COVID-19. LCSW provided family with community resource education for socialization ([place]) Family wish for LCSW to mail this information to their address. LCSW sent out resources on 07/16/19. Emotional support and coping skill education provided as well. Patient was very receptive to this information. UPDATE- Family reports not receiving mailed documents yet. LCSW sent secure email with these resources to caregiver and sent [facility] front desk an additional request to mail out requested resources to patient's address.   Patient Self Care Activities:  . Attends all scheduled provider appointments . Calls provider office for new concerns or questions  Please see past updates related to this goal by clicking on the "Past Updates" button in the selected goal       Follow Up Plan: SW will follow up with patient by phone over the next quarter  Eula Fried, [place], MSW, [email or web address] Phone: [phone]

## 2019-08-06 ENCOUNTER — Telehealth: Payer: Self-pay

## 2019-08-09 ENCOUNTER — Telehealth: Payer: Self-pay | Admitting: General Practice

## 2019-08-09 ENCOUNTER — Ambulatory Visit (INDEPENDENT_AMBULATORY_CARE_PROVIDER_SITE_OTHER): Payer: Medicare Other | Admitting: General Practice

## 2019-08-09 DIAGNOSIS — I10 Essential (primary) hypertension: Secondary | ICD-10-CM | POA: Diagnosis not present

## 2019-08-09 DIAGNOSIS — G8929 Other chronic pain: Secondary | ICD-10-CM

## 2019-08-09 DIAGNOSIS — R413 Other amnesia: Secondary | ICD-10-CM

## 2019-08-09 DIAGNOSIS — R600 Localized edema: Secondary | ICD-10-CM

## 2019-08-09 DIAGNOSIS — M25561 Pain in right knee: Secondary | ICD-10-CM

## 2019-08-09 DIAGNOSIS — E782 Mixed hyperlipidemia: Secondary | ICD-10-CM

## 2019-08-09 NOTE — Chronic Care Management (AMB) (Signed)
Chronic Care Management   Follow Up Note   08/09/2019 Name: Randy [name] MRN: [id number] DOB: [birth year]  Referred by: [name], DO Reason for referral : Chronic Care Management (RNCM Follow up:  Chronic Case Management and Care Coordination Needs)   Randy [name] is a 85 y.o. year old male who is a primary care patient of [name], DO. The CCM team was consulted for assistance with chronic disease management and care coordination needs.    Review of patient status, including review of consultants reports, relevant laboratory and other test results, and collaboration with appropriate care team members and the patient's provider was performed as part of comprehensive patient evaluation and provision of chronic care management services.    SDOH (Social Determinants of Health) assessments performed: Yes See Care Plan activities for detailed interventions related to [facility])     Outpatient Encounter Medications as of 08/09/2019  Medication Sig  . carbidopa-levodopa (SINEMET IR) 25-100 MG tablet Take 1 tablet by mouth 4 (four) times daily.   . cholecalciferol (VITAMIN D) 1000 units tablet Take 1,000 Units by mouth daily.  . Coenzyme Q10 (CO Q 10) 100 MG CAPS Take 1 capsule by mouth daily.   . diclofenac Sodium (VOLTAREN) 1 % GEL Apply 2 g topically 4 (four) times daily as needed. To knee arthritis pain  . enalapril (VASOTEC) 20 MG tablet TAKE 1 TABLET BY MOUTH TWICE A DAY  . fluticasone (FLONASE) 50 MCG/ACT nasal spray Place 2 sprays into both nostrils daily as needed for allergies.   . furosemide (LASIX) 20 MG tablet Take 1 tablet (20 mg total) by mouth every other day.  [name] MELATONIN PO Take 1 tablet by mouth at bedtime.  . Omega-3 Fatty Acids (FISH OIL) 1200 MG CAPS Take 1,200 mg by mouth 2 (two) times daily.   [name] omeprazole (PRILOSEC) 20 MG capsule Take 20 mg by mouth daily.  . vitamin C (ASCORBIC ACID) 500 MG tablet Take 500 mg by mouth daily.   No  facility-administered encounter medications on file as of 08/09/2019.     Objective:   Goals Addressed              This Visit's Progress   .  [name]'s memory is failing (pt-stated)        Current Barriers:  [name] Knowledge Deficits related to falls prevention, decline in memory . Cognitive Deficits . Memory issues  Nurse Case Manager Clinical Goal(s):  [name] Over the next 90 days, patient will work with [facility]  to address needs related to falls prevention and memory improvement  Interventions:  . Provided verbal education re: Potential causes of falls and Fall prevention strategies . Assessed for falls since last encounter.-No new falls (04-05-2019): patient compliant using cane Daughter confirms not new falls recently.  08-09-2019: Per the daughter the patient has not had any new falls.  . Assessed patients knowledge of fall risk prevention secondary to previously provided education. Review of safety in the home  . Evaluation of current treatment plan related to dementia and patient's adherence to plan as established by provider. [name] with PCP and CCM Pharm D  regarding patient's symptoms and daughter's concerns. Both advised to request patient stop Hydroclorathiazide and continue to monitor blood pressure.  The patient remains off of the Hydrochlorothiazide  . Discussed plans with patient for ongoing care management follow up and provided patient with direct contact information for care management team . Reviewed episodes of dizziness and light headedness, per  the daughter this has gotten a lot better since not taking HCTZ. 08-09-2019: Per the patients daughter the patient has not had any more episodes of dizziness.  . She stated he dad does check his blood pressures daily, his blood pressure has been stable  BP Readings from Last 3 Encounters:  07/19/19 (!) 150/75  06/30/19 (!) 163/79  03/02/19 121/78         Patient Self Care Activities:  . [name] (assistive device)  appropriately with all ambulation . De-clutter walkways . Change positions slowly . Wear secure fitting shoes at all times with ambulation . Utilize home lighting for dim lit areas . Have self and pet awareness at all times   Patient Self Care Activities:  . Currently UNABLE TO independently prevent falls  . Attends all scheduled provider appointments . Performs ADL's independently  Please see past updates related to this goal by clicking on the "Past Updates" button in the selected goal      .  RNCM: Pt's daughter- "the pain is better for a few days after he gets the shots" (pt-stated)        CARE PLAN ENTRY (see longitudinal plan of care for additional care plan information)  Current Barriers:  [name] Knowledge Deficits related to pain relief in patient with chronic knee pain . Care Coordination needs related to pain control in a patient with Chronic knee pain (disease states)  Nurse Case Manager Clinical Goal(s):  [name] Over the next 120 days, patient will verbalize understanding of plan for pain management for chronic knee pain . Over the next 120 days, patient will work with [facility] team, and pcp to address needs related to pain in bilateral knees and best treatment options . Over the next 120 days, patient will demonstrate a decrease in pain exacerbations as evidenced by controlled level of pain in bilateral knees . Over the next 120 days, patient will demonstrate improved health management independence as evidenced byno falls, ability to do normal activities and improved quality of life  Interventions:  . Inter-disciplinary care team collaboration (see longitudinal plan of care) . Evaluation of current treatment plan related to pain management  and patient's adherence to plan as established by provider. . Advised patient to call the provider for worsening sx/sx of uncontrolled pain or increased level of intensity of pain . Provided education to patient re: alternative pain relief  measures  . Reviewed medications with patient and discussed compliance.  The patient states that the shots received on 7-12 were helpful for pain relief. This work for a few days and then the patient generally complains again with pain.  The patient has had this for a while per the daughter and the topical NSAIDs do work and help also.  [name] Discussed plans with patient for ongoing care management follow up and provided patient with direct contact information for care management team  Patient Self Care Activities:  . Patient verbalizes understanding of plan to manage pain of bilateral knees by working with the pcp and CCM team . Calls provider office for new concerns or questions . Unable to independently manage pain and discomfort of bilateral knees  Initial goal documentation     .  RNCM: Recently Dad has had swelling in both of his legs and feet        Current Barriers:  [name] Knowledge Deficits related to management of HTN as evidence of swelling in feet and legs, not always being able to eat a heart healthy diet .  Cognitive Deficits  Nurse Case Manager Clinical Goal(s):  [name] Over the next 90 days, patient will verbalize understanding of plan for managing HTN and HLD . Over the next 90 days, patient will work with [facility] [place] pharmacist and pcp to address needs related to managing HTN and increased swelling in feet and legs . Over the next 90 days, patient will demonstrate a decrease in bilateral lower extremity edema exacerbations as evidenced by normalized blood pressures and no evidence of edema present in bilateral lower legs . Over the next 90 days, patient will demonstrate improved adherence to prescribed treatment plan for wearing knee high ted hose as evidenced bydecreased bilateral lower leg edema . Over the next 90 days, patient will work with CM team pharmacist to review medications as needed and take Lasix as prescribed by pcp . Over the next 90 days, the patient will demonstrate ongoing  self health care management ability as evidenced by elevating legs when sitting, taking meds as prescribed, following a heart healthy diet, and wearing ted hose to reduce bilateral lower leg edema.  Interventions:  . Evaluation of current treatment plan related to HTN and HLD and patient's adherence to plan as established by provider. . Provided education to patient re: best way to put on ted hose with the use of a plastic grocery bag for ease of putting on hose. Also alternative compression socks" found on [place] or at [place] that my be beneficial for the patient.  08-09-2019: The patients daughter states that the patient is still having issues putting on the ted hose even with the ideas given to them with the grocery bags.  The daughter did get the patient some diabetic socks.He can put those on. Education given on elevation of legs when sitting and to try ace wraps, just make sure they are not too tight around his legs. The patients daughter states this may be something he can do. Did discuss if his legs were tight and weeping with fluid. The patients daughter states she does not see any fluid coming out of his legs. She does say that sometimes the swelling is not as bad as other days. Education and support given.  . Reviewed medications with patient and discussed the use of Lasix. The daughter verbalized the patient did not have any Lasix. The patient has been started back on Lasix and per the daughter today the Lasix has been effective in helping with the swelling and she has noted decrease in swelling. 08-09-2019: the patient is taking lasix 20 mg QOD as prescribed. The patients daughter notes that this is helping with the swelling in his legs. . Discussed plans with patient for ongoing care management follow up and provided patient with direct contact information for care management team . Advised patient, providing education and rationale, to monitor blood pressure daily and record, calling pcp for  findings outside established parameters.  . Provided patient with heart healthy  educational materials related to adhering to heart healthy diet  . Pharmacy referral for medication change follow up and support . Educated daughter on the [place] functionality to view notes/lab work and communicate with the pcp team  Patient Self Care Activities:  . Patient verbalizes understanding of plan to use Lasix prn for swelling in feet and legs per MD recommendations . Attends all scheduled provider appointments . Calls provider office for new concerns or questions . Unable to independently manage bilateral lower leg edema  Please see past updates related to this goal by clicking on  the "Past Updates" button in the selected goal          Plan:   Telephone follow up appointment with care management team member scheduled for: 10-04-2019 at [place], MSN, [place] Mobile: [phone]

## 2019-08-09 NOTE — Patient Instructions (Signed)
Visit Information  Goals Addressed              This Visit's Progress     [name]'s memory is failing (pt-stated)        Current Barriers:   Knowledge Deficits related to falls prevention, decline in memory  Cognitive Deficits  Memory issues  Nurse Case Manager Clinical Goal(s):   Over the next 90 days, patient will work with [facility]  to address needs related to falls prevention and memory improvement  Interventions:   Provided verbal education re: Potential causes of falls and Fall prevention strategies  Assessed for falls since last encounter.-No new falls (04-05-2019): patient compliant using cane Daughter confirms not new falls recently.  08-09-2019: Per the daughter the patient has not had any new falls.   Assessed patients knowledge of fall risk prevention secondary to previously provided education. Review of safety in the home   Evaluation of current treatment plan related to dementia and patient's adherence to plan as established by provider.  Collaborated with PCP and CCM Pharm D  regarding patient's symptoms and daughter's concerns. Both advised to request patient stop Hydroclorathiazide and continue to monitor blood pressure.  The patient remains off of the Hydrochlorothiazide   Discussed plans with patient for ongoing care management follow up and provided patient with direct contact information for care management team  Reviewed episodes of dizziness and light headedness, per the daughter this has gotten a lot better since not taking HCTZ. 08-09-2019: Per the patients daughter the patient has not had any more episodes of dizziness.   She stated he dad does check his blood pressures daily, his blood pressure has been stable  BP Readings from Last 3 Encounters:  07/19/19 (!) 150/75  06/30/19 (!) 163/79  03/02/19 121/78         Patient Self Care Activities:   [name] (assistive device) appropriately with all ambulation  De-clutter walkways  Change  positions slowly  Wear secure fitting shoes at all times with ambulation  Utilize home lighting for dim lit areas  Have self and pet awareness at all times   Patient Self Care Activities:   Currently UNABLE TO independently prevent falls   Attends all scheduled provider appointments  Performs ADL's independently  Please see past updates related to this goal by clicking on the "Past Updates" button in the selected goal        RNCM: Pt's daughter- "the pain is better for a few days after he gets the shots" (pt-stated)        CARE PLAN ENTRY (see longitudinal plan of care for additional care plan information)  Current Barriers:   Knowledge Deficits related to pain relief in patient with chronic knee pain  Care Coordination needs related to pain control in a patient with Chronic knee pain (disease states)  Nurse Case Manager Clinical Goal(s):   Over the next 120 days, patient will verbalize understanding of plan for pain management for chronic knee pain  Over the next 120 days, patient will work with [facility], CCM team, and pcp to address needs related to pain in bilateral knees and best treatment options  Over the next 120 days, patient will demonstrate a decrease in pain exacerbations as evidenced by controlled level of pain in bilateral knees  Over the next 120 days, patient will demonstrate improved health management independence as evidenced byno falls, ability to do normal activities and improved quality of life  Interventions:   Inter-disciplinary care team collaboration (see longitudinal plan  of care)  Evaluation of current treatment plan related to pain management  and patient's adherence to plan as established by provider.  Advised patient to call the provider for worsening sx/sx of uncontrolled pain or increased level of intensity of pain  Provided education to patient re: alternative pain relief measures   Reviewed medications with patient and discussed  compliance.  The patient states that the shots received on 7-12 were helpful for pain relief. This work for a few days and then the patient generally complains again with pain.  The patient has had this for a while per the daughter and the topical NSAIDs do work and help also.   Discussed plans with patient for ongoing care management follow up and provided patient with direct contact information for care management team  Patient Self Care Activities:   Patient verbalizes understanding of plan to manage pain of bilateral knees by working with the pcp and CCM team  Calls provider office for new concerns or questions  Unable to independently manage pain and discomfort of bilateral knees  Initial goal documentation       RNCM: Recently Dad has had swelling in both of his legs and feet        Current Barriers:   Knowledge Deficits related to management of HTN as evidence of swelling in feet and legs, not always being able to eat a heart healthy diet  Cognitive Deficits  Nurse Case Manager Clinical Goal(s):   Over the next 90 days, patient will verbalize understanding of plan for managing HTN and HLD  Over the next 90 days, patient will work with [facility] pharmacist and pcp to address needs related to managing HTN and increased swelling in feet and legs  Over the next 90 days, patient will demonstrate a decrease in bilateral lower extremity edema exacerbations as evidenced by normalized blood pressures and no evidence of edema present in bilateral lower legs  Over the next 90 days, patient will demonstrate improved adherence to prescribed treatment plan for wearing knee high ted hose as evidenced bydecreased bilateral lower leg edema  Over the next 90 days, patient will work with CM team pharmacist to review medications as needed and take Lasix as prescribed by pcp  Over the next 90 days, the patient will demonstrate ongoing self health care management ability as evidenced by elevating  legs when sitting, taking meds as prescribed, following a heart healthy diet, and wearing ted hose to reduce bilateral lower leg edema.  Interventions:   Evaluation of current treatment plan related to HTN and HLD and patient's adherence to plan as established by provider.  Provided education to patient re: best way to put on ted hose with the use of a plastic grocery bag for ease of putting on hose. Also alternative compression socks" found on [place] or at [place] that my be beneficial for the patient.  08-09-2019: The patients daughter states that the patient is still having issues putting on the ted hose even with the ideas given to them with the grocery bags.  The daughter did get the patient some diabetic socks.He can put those on. Education given on elevation of legs when sitting and to try ace wraps, just make sure they are not too tight around his legs. The patients daughter states this may be something he can do. Did discuss if his legs were tight and weeping with fluid. The patients daughter states she does not see any fluid coming out of his legs. She does say  that sometimes the swelling is not as bad as other days. Education and support given.   Reviewed medications with patient and discussed the use of Lasix. The daughter verbalized the patient did not have any Lasix. The patient has been started back on Lasix and per the daughter today the Lasix has been effective in helping with the swelling and she has noted decrease in swelling. 08-09-2019: the patient is taking lasix 20 mg QOD as prescribed. The patients daughter notes that this is helping with the swelling in his legs.  Discussed plans with patient for ongoing care management follow up and provided patient with direct contact information for care management team  Advised patient, providing education and rationale, to monitor blood pressure daily and record, calling pcp for findings outside established parameters.   Provided patient with  heart healthy  educational materials related to adhering to heart healthy diet   Pharmacy referral for medication change follow up and support  Educated daughter on the [place] functionality to view notes/lab work and communicate with the pcp team  Patient Self Care Activities:   Patient verbalizes understanding of plan to use Lasix prn for swelling in feet and legs per MD recommendations  Attends all scheduled provider appointments  Calls provider office for new concerns or questions  Unable to independently manage bilateral lower leg edema  Please see past updates related to this goal by clicking on the "Past Updates" button in the selected goal         Patient verbalizes understanding of instructions provided today.   Telephone follow up appointment with care management team member scheduled for: 10-04-2019 at [place], MSN, [place] Mobile: [phone]

## 2019-08-11 ENCOUNTER — Telehealth: Payer: Self-pay | Admitting: Pharmacist

## 2019-08-11 ENCOUNTER — Telehealth: Payer: Self-pay

## 2019-08-11 NOTE — Telephone Encounter (Signed)
  Chronic Care Management   Outreach Note  08/11/2019 Name: Randy [name] MRN: [id number] DOB: [birth year]  Referred by: [name], DO Reason for referral : No chief complaint on file.   An unsuccessful telephone outreach was attempted today. The patient was referred to the case management team for assistance with care management and care coordination.   Follow Up Plan: Will collaborate with Care Guide to outreach to schedule follow up with me  [name], PharmD, [place] [facility] [phone]

## 2019-08-12 NOTE — Telephone Encounter (Signed)
Pt has been r/s for 09/03/2019 

## 2019-08-21 ENCOUNTER — Other Ambulatory Visit: Payer: Self-pay | Admitting: Family Medicine

## 2019-08-21 DIAGNOSIS — R600 Localized edema: Secondary | ICD-10-CM

## 2019-08-26 ENCOUNTER — Telehealth: Payer: Self-pay | Admitting: Licensed Clinical Social Worker

## 2019-08-26 ENCOUNTER — Telehealth: Payer: Self-pay

## 2019-08-26 NOTE — Telephone Encounter (Signed)
  Chronic Care Management    Clinical Social Work General Follow Up Note  08/26/2019 Name: Randy [name] MRN: [id number] DOB: 1934/09/11  Randy [name] is a 85 y.o. year old male who is a primary care patient of [name], DO. The CCM team was consulted for assistance with Level of Care Concerns.   Review of patient status, including review of consultants reports, relevant laboratory and other test results, and collaboration with appropriate care team members and the patient's provider was performed as part of comprehensive patient evaluation and provision of chronic care management services.    LCSW completed CCM outreach attempt today but was unable to reach patient successfully. A HIPPA compliant voice message was left encouraging patient to return call once available. LCSW will reschedule CCM SW appointment as well.  Outpatient Encounter Medications as of 08/26/2019  Medication Sig  . carbidopa-levodopa (SINEMET IR) 25-100 MG tablet Take 1 tablet by mouth 4 (four) times daily.   . cholecalciferol (VITAMIN D) 1000 units tablet Take 1,000 Units by mouth daily.  . Coenzyme Q10 (CO Q 10) 100 MG CAPS Take 1 capsule by mouth daily.   . diclofenac Sodium (VOLTAREN) 1 % GEL Apply 2 g topically 4 (four) times daily as needed. To knee arthritis pain  . enalapril (VASOTEC) 20 MG tablet TAKE 1 TABLET BY MOUTH TWICE A DAY  . fluticasone (FLONASE) 50 MCG/ACT nasal spray Place 2 sprays into both nostrils daily as needed for allergies.   . furosemide (LASIX) 20 MG tablet Take 1 tablet (20 mg total) by mouth every other day.  [name] MELATONIN PO Take 1 tablet by mouth at bedtime.  . Omega-3 Fatty Acids (FISH OIL) 1200 MG CAPS Take 1,200 mg by mouth 2 (two) times daily.   [name] omeprazole (PRILOSEC) 20 MG capsule Take 20 mg by mouth daily.  . vitamin C (ASCORBIC ACID) 500 MG tablet Take 500 mg by mouth daily.   No facility-administered encounter medications on file as of 08/26/2019.     Follow Up  Plan: Randy [name] will reach out to patient to reschedule appointment.   Eula Fried, BSW, MSW, [name] Phone: [phone]

## 2019-08-30 ENCOUNTER — Telehealth: Payer: Self-pay

## 2019-09-02 NOTE — Telephone Encounter (Signed)
Pt has been r/s for 09/14/2019 make sure that you call daughter

## 2019-09-03 ENCOUNTER — Ambulatory Visit: Payer: Medicare Other | Admitting: Pharmacist

## 2019-09-03 DIAGNOSIS — R600 Localized edema: Secondary | ICD-10-CM

## 2019-09-03 DIAGNOSIS — I10 Essential (primary) hypertension: Secondary | ICD-10-CM | POA: Diagnosis not present

## 2019-09-03 DIAGNOSIS — E782 Mixed hyperlipidemia: Secondary | ICD-10-CM | POA: Diagnosis not present

## 2019-09-03 NOTE — Patient Instructions (Signed)
Thank you allowing the Chronic Care Management Team to be a part of your care! It was a pleasure speaking with you today!     CCM (Chronic Care Management) Team    Noreene Larsson RN, MSN, CCM Nurse Care Coordinator  [phone]   [name] PharmD  Clinical Pharmacist  [phone]   Eula Fried LCSW Clinical Social Worker [phone]  Visit Information  Goals Addressed            This Visit's Progress   . PharmD - Medication Management       CARE PLAN ENTRY (see longitudinal plan of care for additional care plan information)  Current Barriers:    . Cognitive Deficits  Pharmacist Clinical Goal(s):  [name] Over the next 30 days, patient/caregiver will work with CM Pharmacist to address needs related to medication management  Interventions: . Follow up regarding blood pressure monitoring and control ? Reports patient taking: ? Enalapril 20 mg twice daily ? Furosemide 20 mg - every other day (on Mondays, Wednesdays and Fridays) ? Unable to review BP readings today ? Daughter states that she will send these to me this evening ? Reports patient still has a little swelling on and off in his legs.  ? Reports patient has stopped using compression socks again due to difficulty with getting these on even with previous strategies from CCM Nurse Case Manager ? Note CCM Nurse Case Manager counseled caregiver during last call on strategy for using ACE wrap as alternative for compression.  ? Reiterate counseling from CCM Nurse Case Manager to try ace wraps, just make sure they are not too tight around his legs (to not limit circulation) ? Encourage to aid patient with continuing to check BP, keep log and contact office for readings outside of provided parameters, worsening swelling or new symptoms . Discuss strategies to aid with medication adherence ? Reports patient continues having difficulty with consistently remembering fourth dose of carbidopa/levodopa.  ? Note patient  using weekly pillbox with slots for four times daily dosing ? Again encourage to consider using medication alarm to help patient remember throughout the day  Patient Self Care Activities:  . Patient to take medications as directed with assistance of daughter o Daughter aids Mr. Boyd with filling his weekly pillbox and monitoring for adherence . Attends all scheduled provider appointments . Calls pharmacy for medication refills . Calls provider office for new concerns or questions . Patient/daughter to keep log of BP readings   Please see past updates related to this goal by clicking on the "Past Updates" button in the selected goal         Patient verbalizes understanding of instructions provided today.   Telephone follow up appointment with care management team member scheduled for: 10/25 at 9 am  [name], PharmD, [place] [phone]

## 2019-09-03 NOTE — Chronic Care Management (AMB) (Signed)
Chronic Care Management   Follow Up Note   09/03/2019 Name: Randy [name] MRN: [id number] DOB: [birth year]  Referred by: Randy Hauser, DO Reason for referral : Chronic Care Management (Patient Phone Call)   Randy [name] is a 85 y.o. year old male who is a primary care patient of Randy Hauser, DO. The CCM team was consulted for assistance with chronic disease management and care coordination needs. Randy [name] has a past medical history including, but not limited to: hypertension, osteoarthritis, hyperlipidemia, insomnia and memory loss.  I reached out to patient's daughter/caregiver, Randy [name], by phone today.    Review of patient status, including review of consultants reports, relevant laboratory and other test results, and collaboration with appropriate care team members and the patient's provider was performed as part of comprehensive patient evaluation and provision of chronic care management services.    SDOH (Social Determinants of Health) assessments performed: No See Care Plan activities for detailed interventions related to Randy [name])     Outpatient Encounter Medications as of 09/03/2019  Medication Sig  . diclofenac Sodium (VOLTAREN) 1 % GEL Apply 2 g topically 4 (four) times daily as needed. To knee arthritis pain  . enalapril (VASOTEC) 20 MG tablet TAKE 1 TABLET BY MOUTH TWICE A DAY  . furosemide (LASIX) 20 MG tablet Take 1 tablet (20 mg total) by mouth every other day.  . carbidopa-levodopa (SINEMET IR) 25-100 MG tablet Take 1 tablet by mouth 4 (four) times daily.   . cholecalciferol (VITAMIN D) 1000 units tablet Take 1,000 Units by mouth daily.  . Coenzyme Q10 (CO Q 10) 100 MG CAPS Take 1 capsule by mouth daily.   . fluticasone (FLONASE) 50 MCG/ACT nasal spray Place 2 sprays into both nostrils daily as needed for allergies.   [name] MELATONIN PO Take 1 tablet by mouth at bedtime.  . Omega-3 Fatty Acids (FISH OIL) 1200 MG CAPS Take 1,200 mg by mouth 2 (two)  times daily.   [name] omeprazole (PRILOSEC) 20 MG capsule Take 20 mg by mouth daily.  . vitamin C (ASCORBIC ACID) 500 MG tablet Take 500 mg by mouth daily.   No facility-administered encounter medications on file as of 09/03/2019.    Goals Addressed            This Visit's Progress   . PharmD - Medication Management       CARE PLAN ENTRY (see longitudinal plan of care for additional care plan information)  Current Barriers:    . Cognitive Deficits  Pharmacist Clinical Goal(s):  [name] Over the next 30 days, patient/caregiver will work with CM Pharmacist to address needs related to medication management  Interventions: . Follow up regarding blood pressure monitoring and control ? Reports patient taking: ? Enalapril 20 mg twice daily ? Furosemide 20 mg - every other day (on Mondays, Wednesdays and Fridays) ? Unable to review BP readings today ? Daughter states that she will send these to me this evening ? Reports patient still has a little swelling on and off in his legs.  ? Reports patient has stopped using compression socks again due to difficulty with getting these on even with previous strategies from CCM Nurse Case Manager ? Note CCM Nurse Case Manager counseled caregiver during last call on strategy for using ACE wrap as alternative for compression.  ? Reiterate counseling from CCM Nurse Case Manager to try ace wraps, just make sure they are not too tight around his legs (to not limit circulation) ? Encourage  to aid patient with continuing to check BP, keep log and contact office for readings outside of provided parameters, worsening swelling or new symptoms . Discuss strategies to aid with medication adherence ? Reports patient continues having difficulty with consistently remembering fourth dose of carbidopa/levodopa.  ? Note patient using weekly pillbox with slots for four times daily dosing ? Again encourage to consider using medication alarm to help patient remember throughout the  day  Patient Self Care Activities:  . Patient to take medications as directed with assistance of daughter o Daughter aids [name] with filling his weekly pillbox and monitoring for adherence . Attends all scheduled provider appointments . Calls pharmacy for medication refills . Calls provider office for new concerns or questions . Patient/daughter to keep log of BP readings   Please see past updates related to this goal by clicking on the "Past Updates" button in the selected goal         Plan  Telephone follow up appointment with care management team member scheduled for: 10/25 at 9 am  [name], PharmD, [place] [phone]

## 2019-09-08 ENCOUNTER — Ambulatory Visit: Payer: Self-pay | Admitting: Pharmacist

## 2019-09-08 DIAGNOSIS — I10 Essential (primary) hypertension: Secondary | ICD-10-CM

## 2019-09-08 NOTE — Chronic Care Management (AMB) (Signed)
Chronic Care Management   Follow Up Note   09/08/2019 Name: Randy [name] MRN: [id number] DOB: 1934-10-11  Referred by: Randy [name] Reason for referral : Care Coordination (Message from Caregiver)   Randy [name] is a 85 y.o. year old male who is a primary care patient of Randy [name]. The CCM team was consulted for assistance with chronic disease management and care coordination needs.    Receive message from patient's daughter/caregiver with patient's blood pressure log results.  Review of patient status, including review of consultants reports, relevant laboratory and other test results, and collaboration with appropriate care team members and the patient's provider was performed as part of comprehensive patient evaluation and provision of chronic care management services.    SDOH (Social Determinants of Health) assessments performed: No See Care Plan activities for detailed interventions related to [facility])     Outpatient Encounter Medications as of 09/08/2019  Medication Sig  . carbidopa-levodopa (SINEMET IR) 25-100 MG tablet Take 1 tablet by mouth 4 (four) times daily.   . cholecalciferol (VITAMIN D) 1000 units tablet Take 1,000 Units by mouth daily.  . Coenzyme Q10 (CO Q 10) 100 MG CAPS Take 1 capsule by mouth daily.   . diclofenac Sodium (VOLTAREN) 1 % GEL Apply 2 g topically 4 (four) times daily as needed. To knee arthritis pain  . enalapril (VASOTEC) 20 MG tablet TAKE 1 TABLET BY MOUTH TWICE A DAY  . fluticasone (FLONASE) 50 MCG/ACT nasal spray Place 2 sprays into both nostrils daily as needed for allergies.   . furosemide (LASIX) 20 MG tablet Take 1 tablet (20 mg total) by mouth every other day.  [name] MELATONIN PO Take 1 tablet by mouth at bedtime.  . Omega-3 Fatty Acids (FISH OIL) 1200 MG CAPS Take 1,200 mg by mouth 2 (two) times daily.   [name] omeprazole (PRILOSEC) 20 MG capsule Take 20 mg by mouth daily.  . vitamin C (ASCORBIC ACID) 500 MG tablet Take  500 mg by mouth daily.   No facility-administered encounter medications on file as of 09/08/2019.    Goals Addressed            This Visit's Progress   . PharmD - Medication Management       CARE PLAN ENTRY (see longitudinal plan of care for additional care plan information)  Current Barriers:    . Cognitive Deficits  Pharmacist Clinical Goal(s):  [name] Over the next 30 days, patient/caregiver will work with CM Pharmacist to address needs related to medication management  Interventions: . Receive BP readings from patient's daughter/caregiver. Readings from past month 8/6: 108/78, HR 78 8/8: 141/83, HR 64 8/15: 137/81, HR 73 8/25: 131/77, HR 66 8/26: 130/76, HR 67 8/31: 136/90, HR ? [name] Collaborate with CCM Nurse Case Manager to share readings as requested  Patient Self Care Activities:  . Patient to take medications as directed with assistance of daughter o Daughter aids [name] with filling his weekly pillbox and monitoring for adherence . Attends all scheduled provider appointments . Calls pharmacy for medication refills . Calls provider office for new concerns or questions . Patient/daughter to keep log of BP readings   Please see past updates related to this goal by clicking on the "Past Updates" button in the selected goal         Plan  Telephone follow up appointment with care management team member scheduled for: 10/25 at 9 am  Randy [name], PharmD, [place]  [facility] [employer] [phone]

## 2019-09-14 ENCOUNTER — Other Ambulatory Visit: Payer: Self-pay | Admitting: Family Medicine

## 2019-09-14 ENCOUNTER — Ambulatory Visit (INDEPENDENT_AMBULATORY_CARE_PROVIDER_SITE_OTHER): Payer: Medicare Other | Admitting: Licensed Clinical Social Worker

## 2019-09-14 DIAGNOSIS — M1711 Unilateral primary osteoarthritis, right knee: Secondary | ICD-10-CM

## 2019-09-14 DIAGNOSIS — R296 Repeated falls: Secondary | ICD-10-CM

## 2019-09-14 DIAGNOSIS — M25561 Pain in right knee: Secondary | ICD-10-CM

## 2019-09-14 DIAGNOSIS — R531 Weakness: Secondary | ICD-10-CM

## 2019-09-14 DIAGNOSIS — G8929 Other chronic pain: Secondary | ICD-10-CM

## 2019-09-14 DIAGNOSIS — E782 Mixed hyperlipidemia: Secondary | ICD-10-CM

## 2019-09-14 DIAGNOSIS — M1712 Unilateral primary osteoarthritis, left knee: Secondary | ICD-10-CM

## 2019-09-14 DIAGNOSIS — R413 Other amnesia: Secondary | ICD-10-CM

## 2019-09-14 DIAGNOSIS — R600 Localized edema: Secondary | ICD-10-CM

## 2019-09-14 DIAGNOSIS — I872 Venous insufficiency (chronic) (peripheral): Secondary | ICD-10-CM

## 2019-09-14 DIAGNOSIS — M25562 Pain in left knee: Secondary | ICD-10-CM

## 2019-09-14 DIAGNOSIS — I10 Essential (primary) hypertension: Secondary | ICD-10-CM

## 2019-09-14 NOTE — Chronic Care Management (AMB) (Signed)
Chronic Care Management    Clinical Social Work Follow Up Note  09/14/2019 Name: Randy [name] MRN: [id number] DOB: 03/29/1934  Randy [name] is a 85 y.o. year old male who is a primary care patient of [name], DO. The CCM team was consulted for assistance with [employer]  and Level of Care Concerns.   Review of patient status, including review of consultants reports, other relevant assessments, and collaboration with appropriate care team members and the patient's provider was performed as part of comprehensive patient evaluation and provision of chronic care management services.    SDOH (Social Determinants of Health) assessments performed: Yes    Outpatient Encounter Medications as of 09/14/2019  Medication Sig  . carbidopa-levodopa (SINEMET IR) 25-100 MG tablet Take 1 tablet by mouth 4 (four) times daily.   . cholecalciferol (VITAMIN D) 1000 units tablet Take 1,000 Units by mouth daily.  . Coenzyme Q10 (CO Q 10) 100 MG CAPS Take 1 capsule by mouth daily.   . diclofenac Sodium (VOLTAREN) 1 % GEL Apply 2 g topically 4 (four) times daily as needed. To knee arthritis pain  . enalapril (VASOTEC) 20 MG tablet TAKE 1 TABLET BY MOUTH TWICE A DAY  . fluticasone (FLONASE) 50 MCG/ACT nasal spray Place 2 sprays into both nostrils daily as needed for allergies.   . furosemide (LASIX) 20 MG tablet Take 1 tablet (20 mg total) by mouth every other day.  [name] MELATONIN PO Take 1 tablet by mouth at bedtime.  . Omega-3 Fatty Acids (FISH OIL) 1200 MG CAPS Take 1,200 mg by mouth 2 (two) times daily.   [name] omeprazole (PRILOSEC) 20 MG capsule Take 20 mg by mouth daily.  . vitamin C (ASCORBIC ACID) 500 MG tablet Take 500 mg by mouth daily.   No facility-administered encounter medications on file as of 09/14/2019.     Goals Addressed    .  SW-"I'm having a hard time managing my health" (pt-stated)        Current Barriers:  . Financial constraints . Level of care concerns . ADL IADL  limitations . Social Isolation . Limited access to caregiver . Memory Deficits . Lacks knowledge of community resource: personal care service resources within the area that may benefit patient . Lack of socialization   Clinical Social Work Clinical Goal(s):  [name] Over the next 90 days, client will work with SW to address concerns related to lack of care/support within the home  Interventions: . Patient interviewed and appropriate assessments performed . LCSW spoke with patient, patient's spouse and patient's daughter on 09/14/19. Patient has been struggling with fatigue and dizziness. Family report that patient's mobility has decreased. Family deny any recent falls. However, family wishes for PCP to make referral for [facility] at this time to improve patient's overall mobility. LCSW will send in basket message to PCP with this request.  . Provided patient and family with information about personal care service resources within the area. Patient reports that he lives with his spouse and daughter and that his daughter is his main support and assist him with transportation/meal prep/daily needs.  . Discussed plans with patient for ongoing care management follow up and provided patient with direct contact information for care management team . Advised patient to contact CCM team with any urgent concerns . Assisted patient/caregiver with obtaining information about health plan benefits . Provided education and assistance to client regarding Advanced Directives. . Provided education to patient/caregiver regarding level of care options. [name] LCSW provided  self-care education. Patient has ongoing issue with memory and sleep. Patient was encouraged to take medications as prescribed and to ask for help (daughter or spouse) when needed and to try to check blood pressure and record it once a day per PCP's recommendation. Family continue to encourage patient to keep up with blood pressure recordings.  . Patient has been  having pain and discomfort in his right knee which has affected his mobility. PT has now ended. Patient has been wearing his compression stockings. . Daughter/patient reports pt is still taking melatonin dose of 10mg  per night but still struggles with insomnia. . Daughter/patient declined needing MOW's at this time.  . Patient admits to feeling lonely at times due to isolation related to COVID-19. LCSW provided family with community resource education for socialization ([place]) Family wish for LCSW to mail this information to their address. LCSW sent out resources on 07/16/19. Emotional support and coping skill education provided as well. Patient was very receptive to this information. UPDATE- Family report that they contacted [facility] and was informed that they have closed again due to [place]. They are expecting to open up again in October and patient is agreeable to attend center in order to gain socialization.   Patient Self Care Activities:  . Attends all scheduled provider appointments . Calls provider office for new concerns or questions  Please see past updates related to this goal by clicking on the "Past Updates" button in the selected goal      Follow Up Plan: SW will follow up with patient by phone over the next quarter  Eula Fried, [place], MSW, [email or web address] Phone: [phone]

## 2019-09-15 ENCOUNTER — Ambulatory Visit: Payer: Self-pay | Admitting: *Deleted

## 2019-09-15 ENCOUNTER — Emergency Department
Admission: EM | Admit: 2019-09-15 | Discharge: 2019-09-15 | Disposition: A | Discharge status: Home / Self Care | Payer: Medicare Other | Attending: Emergency Medicine | Admitting: Emergency Medicine

## 2019-09-15 ENCOUNTER — Encounter: Payer: Self-pay | Admitting: Emergency Medicine

## 2019-09-15 ENCOUNTER — Other Ambulatory Visit: Payer: Self-pay

## 2019-09-15 DIAGNOSIS — K921 Melena: Secondary | ICD-10-CM | POA: Diagnosis present

## 2019-09-15 DIAGNOSIS — Z5321 Procedure and treatment not carried out due to patient leaving prior to being seen by health care provider: Secondary | ICD-10-CM | POA: Diagnosis not present

## 2019-09-15 HISTORY — DX: Dementia in other diseases classified elsewhere, unspecified severity, without behavioral disturbance, psychotic disturbance, mood disturbance, and anxiety: F02.80

## 2019-09-15 LAB — COMPREHENSIVE METABOLIC PANEL
ALT: 5 U/L (ref 0–44)
AST: 17 U/L (ref 15–41)
Albumin: 3.6 g/dL (ref 3.5–5.0)
Alkaline Phosphatase: 66 U/L (ref 38–126)
Anion gap: 10 (ref 5–15)
BUN: 23 mg/dL (ref 8–23)
CO2: 29 mmol/L (ref 22–32)
Calcium: 9 mg/dL (ref 8.9–10.3)
Chloride: 103 mmol/L (ref 98–111)
Creatinine, Ser: 1 mg/dL (ref 0.61–1.24)
GFR calc Af Amer: >60 mL/min (ref >60)
GFR calc non Af Amer: >60 mL/min (ref >60)
Glucose, Bld: 94 mg/dL (ref 70–99)
Potassium: 4 mmol/L (ref 3.5–5.1)
Sodium: 142 mmol/L (ref 135–145)
Total Bilirubin: 1 mg/dL (ref 0.3–1.2)
Total Protein: 7.3 g/dL (ref 6.5–8.1)

## 2019-09-15 LAB — PROTIME-INR
INR: 1 (ref 0.8–1.2)
Prothrombin Time: 12.6 seconds (ref 11.4–15.2)

## 2019-09-15 LAB — CBC
HCT: 37.2 % — ABNORMAL LOW (ref 39.0–52.0)
Hemoglobin: 12.4 g/dL — ABNORMAL LOW (ref 13.0–17.0)
MCH: 30.2 pg (ref 26.0–34.0)
MCHC: 33.3 g/dL (ref 30.0–36.0)
MCV: 90.5 fL (ref 80.0–100.0)
Platelets: 199 10*3/uL (ref 150–400)
RBC: 4.11 MIL/uL — ABNORMAL LOW (ref 4.22–5.81)
RDW: 14.7 % (ref 11.5–15.5)
WBC: 6 10*3/uL (ref 4.0–10.5)
nRBC: 0 % (ref 0.0–0.2)

## 2019-09-15 LAB — TYPE AND SCREEN
ABO/RH(D): O POS
Antibody Screen: NEGATIVE

## 2019-09-15 NOTE — Telephone Encounter (Signed)
Patient's daughter, [name] called to report patient is having bloody stools. Requesting to call and talk with patient's wife due to patient hx Lewy Body dementia. Patient's wife reports patient had diarrhea x 1 week ago and noted strings of red in stool. Patient had eaten multiple tomatoes and wife attributed strings of red to the tomatoes consumed. Wife reports patient has had hemorrhoids in the past but not sure if hemorrhoids present now. This am after BM, water in commode red. Patient's wife denies patient's c/o dizziness, pale in color. Patient's wife reports patient has been noted holding abdomen more but has not c/o pain. appt made for 09/21/19 with PCP. Encouraged patient's wife to go to [facility] or ED when daughter can take patient. Care advise given. Patient's wife verbalized understanding of care advise but would like to know what PCP thinks. Care advise given to call back or go ED if symptoms worsen.  Reason for Disposition . MODERATE rectal bleeding (small blood clots, passing blood without stool, or toilet water turns red)  Answer Assessment - Initial Assessment Questions 1. APPEARANCE of BLOOD: "What color is it?" "Is it passed separately, on the surface of the stool, or mixed in with the stool?"      Brown red strings in it.  2. AMOUNT: "How much blood was passed?"      Water in commode red  3. FREQUENCY: "How many times has blood been passed with the stools?"      2 4. ONSET: "When was the blood first seen in the stools?" (Days or weeks)      1 week ago  5. DIARRHEA: "Is there also some diarrhea?" If Yes, ask: "How many diarrhea stools were passed in past 24 hours?"      Diarrhea x 1 week ago strings of red noted  6. CONSTIPATION: "Do you have constipation?" If Yes, ask: "How bad is it?"     No  7. RECURRENT SYMPTOMS: "Have you had blood in your stools before?" If Yes, ask: "When was the last time?" and "What happened that time?"      No  8. BLOOD THINNERS: "Do you take any blood  thinners?" (e.g., Coumadin/warfarin, Pradaxa/dabigatran, aspirin)     no 9. OTHER SYMPTOMS: "Do you have any other symptoms?"  (e.g., abdominal pain, vomiting, dizziness, fever)     Abdominal pain  10. PREGNANCY: "Is there any chance you are pregnant?" "When was your last menstrual period?"       na  Protocols used: RECTAL BLEEDING-A-AH

## 2019-09-15 NOTE — Telephone Encounter (Signed)
I just reviewed this message. The patient has already presented to [facility] ED.  After hospital visit - then If they have further questions can contact us back and we can follow-up on 9/14 or sooner if needed.  [name], [facility] 09/15/2019, 2:38 PM

## 2019-09-15 NOTE — Telephone Encounter (Signed)
Spoke to patient's daughter [name] they are waiting at ER --informed her to keep appointment for next week after hospital follow up and can be  changed if needed sooner that 09/21/2019.

## 2019-09-15 NOTE — ED Triage Notes (Signed)
C/O bloody stools for 2 weeks.  AAOx3.  Skin warm and dry. NAD

## 2019-09-21 ENCOUNTER — Encounter: Payer: Self-pay | Admitting: Family Medicine

## 2019-09-21 ENCOUNTER — Other Ambulatory Visit: Payer: Self-pay

## 2019-09-21 ENCOUNTER — Ambulatory Visit (INDEPENDENT_AMBULATORY_CARE_PROVIDER_SITE_OTHER): Payer: Medicare Other | Admitting: Family Medicine

## 2019-09-21 VITALS — BP 155/80 | HR 70 | Temp 96.8°F | Resp 16 | Ht 72.0 in | Wt 241.0 lb

## 2019-09-21 DIAGNOSIS — G8929 Other chronic pain: Secondary | ICD-10-CM

## 2019-09-21 DIAGNOSIS — M545 Low back pain, unspecified: Secondary | ICD-10-CM

## 2019-09-21 DIAGNOSIS — M25562 Pain in left knee: Secondary | ICD-10-CM

## 2019-09-21 DIAGNOSIS — M1711 Unilateral primary osteoarthritis, right knee: Secondary | ICD-10-CM

## 2019-09-21 DIAGNOSIS — K648 Other hemorrhoids: Secondary | ICD-10-CM

## 2019-09-21 DIAGNOSIS — R531 Weakness: Secondary | ICD-10-CM | POA: Diagnosis not present

## 2019-09-21 DIAGNOSIS — M25561 Pain in right knee: Secondary | ICD-10-CM

## 2019-09-21 DIAGNOSIS — R296 Repeated falls: Secondary | ICD-10-CM

## 2019-09-21 MED ORDER — PREDNISONE 20 MG PO TABS: ORAL_TABLET | ORAL | 0 refills | Status: DC

## 2019-09-21 MED ORDER — TRAMADOL HCL 50 MG PO TABS: 50.0000 mg | ORAL_TABLET | Freq: Three times a day (TID) | ORAL | 0 refills | Status: AC | PRN

## 2019-09-21 NOTE — Patient Instructions (Addendum)
Thank you for coming to the office today.  Likely internal hemorrhoid bleeding Can use topical OTC, or suppository, usually for pain As long as it does not get worse, we are okay, if persistent bleeding or other bowel problems, let me know we can refer to GI   Knee pain Back Pain  Start Prednisone taper, 7 days - use ONLY this medicine for first few days see if it helps - you can use Tylenol, and topical treatments no problem.  HOLD Tramadol for first few days.  Then may start tramadol as needed, use cautiously at first, caution sedation grogginess side effect, goal is to just control pain, may increase the frequency and dose as needed, MAX is 2 pills 3 times a day, really would prefer max of about 4 pills in 24 hours.  If run out before 3 months, contact us and we can send a refill. Message or call.  Consider [employer]  [email or web address]/knee-[email or web address]  1-888-YES-FLEX   Please schedule a Follow-up Appointment to: Return in about 3 months (around 12/21/2019) for 3 month follow-up knee/back pain, med refills.  If you have any other questions or concerns, please feel free to call the office or send a message through [address]. You may also schedule an earlier appointment if necessary.  Additionally, you may be receiving a survey about your experience at our office within a few days to 1 week by e-mail or mail. We value your feedback.  [name], DO [place]

## 2019-09-21 NOTE — Progress Notes (Signed)
Subjective:    Patient ID: DOCTOR SHEAHAN, male    DOB: 02/25/1934, 85 y.o.   MRN: [id number]  Randy [name] is a 85 y.o. male presenting on 09/21/2019 for Knee Pain (Right side), Hemorrhoids (onset last 2 weeks seen blood in stool X 3), and Back Pain (onset more than couple of months--Right side)   HPI   Additional update [facility] PT/OT assessment today, says likely OT, they did some basic exercises and assessment.  Right Knee, Chronic Pain / Osteoarthritis Last seen 07/19/19 for bilateral knee steroid injections, dose #3 for right knee, suboptimal result now 2 months later still with severe pain. See prior chart He has not had any recent orthopedic evaluation, has declined He has trial on various medications, intolerance or side effects, or limited results gabapentin, muscle relaxant, nsaids Trying to use topical voltaren PRN some relief Now persistent pain, more debilitating now, difficulty getting out of kitchen chair He took Tramadol in 2020 some relief, not on long term was only temporary, seemed to tolerate well He uses cane or walker for ambulation, needs assistance getting up from seated position PT/OT starting now Denies any new fall or injury, but at risk of falls, joint or knee swelling  Additionally Chronic Low Back Pain, Left sided with some radiation into leg, sciatica Known osteoarthritis In past on medications, last prednisone course in 2020   Rectal Bleeding / Bloody Stool History of Hemorrhoids He has had x 3 episodes of rectal bleeding, bright stools in past x 3 weeks [facility] ED on 09/15/19, had labs and left without being seen after 8-9 hours, told his labs were stable. Labs reviewed CBC with Hgb 12.4, improved from 1 year ago CMET normal - Denies straining with BMs, has been fairly regular Denies constipation   Past Surgical History:  Procedure Laterality Date  . CHOLECYSTECTOMY N/A 09/10/2018   Procedure: LAPAROSCOPIC CHOLECYSTECTOMY;  Surgeon: Benjamine Sprague,  DO;  Location: [facility] ORS;  Service: General;  Laterality: N/A;  . GANGLION CYST EXCISION Left 2013   Left upper arm  . HERNIA REPAIR N/A 2008   M[place], ventral acquired hernia repair  . HOLEP-LASER ENUCLEATION OF THE PROSTATE WITH MORCELLATION N/A 11/06/2018   Procedure: HOLEP-LASER ENUCLEATION OF THE PROSTATE WITH MORCELLATION;  Surgeon: [name], MD;  Location: [facility] ORS;  Service: Urology;  Laterality: N/A;     Depression screen [facility] 2/9 03/02/2019 01/22/2019 09/21/2018  Decreased Interest 0 0 0  Down, Depressed, Hopeless 0 0 0  PHQ - 2 Score 0 0 0    Social History   Tobacco Use  . Smoking status: Former Smoker    Packs/day: 1.00    Years: 12.00    Pack years: 12.00    Types: Cigarettes    Quit date: 1968    Years since quitting: 53.7  . Smokeless tobacco: Never Used  Vaping Use  . Vaping Use: Never used  Substance Use Topics  . Alcohol use: No  . Drug use: No    Review of Systems Per HPI unless specifically indicated above     Objective:    BP (!) 155/80   Pulse 70   Temp (!) 96.8 F (36 C) (Temporal)   Resp 16   Ht 6' (1.829 m)   Wt 241 lb (109.3 kg)   SpO2 96%   BMI 32.69 kg/m   Wt Readings from Last 3 Encounters:  09/21/19 241 lb (109.3 kg)  09/15/19 240 lb 8.4 oz (109.1 kg)  07/19/19 240 lb  9.6 oz (109.1 kg)    Physical Exam Vitals and nursing note reviewed.  Constitutional:      General: He is not in acute distress.    Appearance: He is well-developed. He is not diaphoretic.     Comments: Well-appearing, comfortable, cooperative  HENT:     Head: Normocephalic and atraumatic.  Eyes:     General:        Right eye: No discharge.        Left eye: No discharge.     Conjunctiva/sclera: Conjunctivae normal.  Neck:     Thyroid: No thyromegaly.  Cardiovascular:     Rate and Rhythm: Normal rate and regular rhythm.     Heart sounds: Normal heart sounds. No murmur heard.   Pulmonary:     Effort: Pulmonary effort is normal. No respiratory  distress.     Breath sounds: Normal breath sounds. No wheezing or rales.  Musculoskeletal:        General: Normal range of motion.     Cervical back: Normal range of motion and neck supple.     Right lower leg: Edema (+2 pitting edema lower ext) present.     Left lower leg: Edema (+2 pitting edema lower ext) present.     Comments: Bilateral knees, bulky appearance, crepitus present, with some pain and stiffness on range of motion. Limited strength testing today.  Uses cane, able to stand from seated with assistance on both sides helping him lift up  Back - Low back left sided with some paraspinal hypertonicity, no provoked sciatica on exam  Lymphadenopathy:     Cervical: No cervical adenopathy.  Skin:    General: Skin is warm and dry.     Findings: No erythema or rash.  Neurological:     Mental Status: He is alert and oriented to person, place, and time.  Psychiatric:        Behavior: Behavior normal.     Comments: Well groomed, good eye contact, normal speech and thoughts    Results for orders placed or performed during the hospital encounter of 09/15/19  Comprehensive metabolic panel  Result Value Ref Range   Sodium 142 135 - 145 mmol/L   Potassium 4.0 3.5 - 5.1 mmol/L   Chloride 103 98 - 111 mmol/L   CO2 29 22 - 32 mmol/L   Glucose, Bld 94 70 - 99 mg/dL   BUN 23 8 - 23 mg/dL   Creatinine, Ser 1.00 0.61 - 1.24 mg/dL   Calcium 9.0 8.9 - 10.3 mg/dL   Total Protein 7.3 6.5 - 8.1 g/dL   Albumin 3.6 3.5 - 5.0 g/dL   AST 17 15 - 41 U/L   ALT 5 0 - 44 U/L   Alkaline Phosphatase 66 38 - 126 U/L   Total Bilirubin 1.0 0.3 - 1.2 mg/dL   GFR calc non Af Amer >60 >60 mL/min   GFR calc Af Amer >60 >60 mL/min   Anion gap 10 5 - 15  CBC  Result Value Ref Range   WBC 6.0 4.0 - 10.5 K/uL   RBC 4.11 (L) 4.22 - 5.81 MIL/uL   Hemoglobin 12.4 (L) 13.0 - 17.0 g/dL   HCT 37.2 (L) 39 - 52 %   MCV 90.5 80.0 - 100.0 fL   MCH 30.2 26.0 - 34.0 pg   MCHC 33.3 30.0 - 36.0 g/dL   RDW 14.7 11.5  - 15.5 %   Platelets 199 150 - 400 K/uL   nRBC 0.0 0.0 -  0.2 %  Protime-INR - (order if Patient is taking Coumadin / Warfarin)  Result Value Ref Range   Prothrombin Time 12.6 11.4 - 15.2 seconds   INR 1.0 0.8 - 1.2  Type and screen [place]  Result Value Ref Range   ABO/RH(D) O POS    Antibody Screen NEG    Sample Expiration      09/18/2019,2359 Performed at [facility], [address] [place], [place] [address]       Assessment & Plan:   Problem List Items Addressed This Visit    Knee pain, chronic   Relevant Medications   traMADol (ULTRAM) 50 MG tablet   predniSONE (DELTASONE) 20 MG tablet   Generalized weakness   Chronic low back pain   Relevant Medications   traMADol (ULTRAM) 50 MG tablet   predniSONE (DELTASONE) 20 MG tablet    Other Visit Diagnoses    Primary osteoarthritis of right knee    -  Primary   Relevant Medications   traMADol (ULTRAM) 50 MG tablet   predniSONE (DELTASONE) 20 MG tablet   Bleeding internal hemorrhoids       Recurrent falls           #Rectal Bleeding / suspected internal hemorrhoids Episodic x 3 rectal bleeding with pain or other complicating factors in past 3 weeks Prior hemorrhoid diagnosis Reassurance, reviewed ED labs, mild low Hgb 12.8 range, improved from 1 year ago, no sign of acute anemia or problem from his bleeding No pain, less likely external hemorrhoid. He declines rectal exam today Discussion that if recurrence and more severe or persistent bleeding, can refer to Gen Surg or GI for further management.  #Persistent chronicBILATERALmedialKnee pain (R>L) #Low Back Pain, Left, sciatica Increase risk of falls, recurrent / generalized weakness due to pain and other co morbid problems  S/p Steroid injections in past, #3 injection for both knees was done 07/2019. - temporary relief only Not followed by Orthopedic, he is not interested in surgery  Known OA DJD in other joints, L knee  tricompartmental OA see prior x-ray - Able to bear weight, no mechanical locking - more difficult ambulation due to pain now, difficulty transitioning from seated to standing position - No prior history of knee surgery, arthroscopy  Plan: - Proceed with HH PT/OT [facility] based on weakness, knee pain, arthritis, he will benefit from therapy exercises for improving strength, range of motion, and mobility, reduce fall risk, adapt home for safety  START Prednisone taper 7 days as prescribed for acute pain Left low back and Knee Pain. START Tramadol pain medicine PRN - 50mg  tabs, take 1-2 every 8 hour PRN, discussed goal for max dose initially 2-4 pills a day then can adjust accordingly if tolerated, caution sedation fall risk, he has tolerated before, checked PDMP, #30 pills ordered, call if need more monthly for now. Goal is pain management to improve his function, since not ideal candidate for surgery  Consider [employer], patient and family will look into this further.  Keep on Tylenol, Voltaren, RICE therapy  Future if not improvingwill referOrthopedic secondary opinion - consider repeat injections in future effectiveq 3-6 months  Meds ordered this encounter  Medications  . traMADol (ULTRAM) 50 MG tablet    Sig: Take 1-2 tablets (50-100 mg total) by mouth every 8 (eight) hours as needed for up to 5 days.    Dispense:  30 tablet    Refill:  0  . predniSONE (DELTASONE) 20 MG tablet  Sig: Take daily with food. Start with 60mg  (3 pills) x 2 days, then reduce to 40mg  (2 pills) x 2 days, then 20mg  (1 pill) x 3 days    Dispense:  13 tablet    Refill:  0      Follow up plan: Return in about 3 months (around 12/21/2019) for 3 month follow-up knee/back pain, med refills.   [name], [facility] 09/21/2019, 12:24 PM

## 2019-09-27 ENCOUNTER — Other Ambulatory Visit: Payer: Self-pay | Admitting: Family Medicine

## 2019-09-27 DIAGNOSIS — I10 Essential (primary) hypertension: Secondary | ICD-10-CM

## 2019-10-04 ENCOUNTER — Ambulatory Visit: Payer: Self-pay | Admitting: General Practice

## 2019-10-04 ENCOUNTER — Emergency Department: Payer: Medicare Other

## 2019-10-04 ENCOUNTER — Other Ambulatory Visit: Payer: Self-pay

## 2019-10-04 ENCOUNTER — Telehealth: Payer: Self-pay | Admitting: General Practice

## 2019-10-04 ENCOUNTER — Emergency Department
Admission: EM | Admit: 2019-10-04 | Discharge: 2019-10-04 | Disposition: A | Discharge status: Home / Self Care | Payer: Medicare Other | Attending: Emergency Medicine | Admitting: Emergency Medicine

## 2019-10-04 ENCOUNTER — Encounter: Payer: Self-pay | Admitting: Emergency Medicine

## 2019-10-04 DIAGNOSIS — Z79899 Other long term (current) drug therapy: Secondary | ICD-10-CM | POA: Diagnosis not present

## 2019-10-04 DIAGNOSIS — U071 COVID-19: Secondary | ICD-10-CM | POA: Diagnosis not present

## 2019-10-04 DIAGNOSIS — J1282 Pneumonia due to coronavirus disease 2019: Secondary | ICD-10-CM | POA: Insufficient documentation

## 2019-10-04 DIAGNOSIS — E782 Mixed hyperlipidemia: Secondary | ICD-10-CM | POA: Diagnosis not present

## 2019-10-04 DIAGNOSIS — Z79891 Long term (current) use of opiate analgesic: Secondary | ICD-10-CM | POA: Insufficient documentation

## 2019-10-04 DIAGNOSIS — G8929 Other chronic pain: Secondary | ICD-10-CM

## 2019-10-04 DIAGNOSIS — I10 Essential (primary) hypertension: Secondary | ICD-10-CM

## 2019-10-04 DIAGNOSIS — N390 Urinary tract infection, site not specified: Secondary | ICD-10-CM

## 2019-10-04 DIAGNOSIS — J189 Pneumonia, unspecified organism: Secondary | ICD-10-CM

## 2019-10-04 DIAGNOSIS — R600 Localized edema: Secondary | ICD-10-CM

## 2019-10-04 DIAGNOSIS — R531 Weakness: Secondary | ICD-10-CM

## 2019-10-04 DIAGNOSIS — M25561 Pain in right knee: Secondary | ICD-10-CM

## 2019-10-04 DIAGNOSIS — M810 Age-related osteoporosis without current pathological fracture: Secondary | ICD-10-CM

## 2019-10-04 DIAGNOSIS — I872 Venous insufficiency (chronic) (peripheral): Secondary | ICD-10-CM

## 2019-10-04 DIAGNOSIS — M25562 Pain in left knee: Secondary | ICD-10-CM

## 2019-10-04 DIAGNOSIS — R05 Cough: Secondary | ICD-10-CM | POA: Diagnosis present

## 2019-10-04 LAB — BASIC METABOLIC PANEL
Anion gap: 9 (ref 5–15)
BUN: 21 mg/dL (ref 8–23)
CO2: 28 mmol/L (ref 22–32)
Calcium: 8.8 mg/dL — ABNORMAL LOW (ref 8.9–10.3)
Chloride: 101 mmol/L (ref 98–111)
Creatinine, Ser: 0.97 mg/dL (ref 0.61–1.24)
GFR calc Af Amer: >60 mL/min (ref >60)
GFR calc non Af Amer: >60 mL/min (ref >60)
Glucose, Bld: 101 mg/dL — ABNORMAL HIGH (ref 70–99)
Potassium: 3.5 mmol/L (ref 3.5–5.1)
Sodium: 138 mmol/L (ref 135–145)

## 2019-10-04 LAB — URINALYSIS, COMPLETE (UACMP) WITH MICROSCOPIC
Bilirubin Urine: NEGATIVE
Glucose, UA: NEGATIVE mg/dL
Hgb urine dipstick: NEGATIVE
Ketones, ur: NEGATIVE mg/dL
Nitrite: POSITIVE — AB
Protein, ur: 30 mg/dL — AB
Specific Gravity, Urine: 1.018 (ref 1.005–1.030)
Squamous Epithelial / HPF: NONE SEEN (ref 0–5)
WBC, UA: >50 WBC/hpf — ABNORMAL HIGH (ref 0–5)
pH: 5 (ref 5.0–8.0)

## 2019-10-04 LAB — CBC
HCT: 39.3 % (ref 39.0–52.0)
Hemoglobin: 13 g/dL (ref 13.0–17.0)
MCH: 30.3 pg (ref 26.0–34.0)
MCHC: 33.1 g/dL (ref 30.0–36.0)
MCV: 91.6 fL (ref 80.0–100.0)
Platelets: 177 10*3/uL (ref 150–400)
RBC: 4.29 MIL/uL (ref 4.22–5.81)
RDW: 15.6 % — ABNORMAL HIGH (ref 11.5–15.5)
WBC: 5.6 10*3/uL (ref 4.0–10.5)
nRBC: 0 % (ref 0.0–0.2)

## 2019-10-04 LAB — RESPIRATORY PANEL BY RT PCR (FLU A&B, COVID)
Influenza A by PCR: NEGATIVE
Influenza B by PCR: NEGATIVE
SARS Coronavirus 2 by RT PCR: POSITIVE — AB

## 2019-10-04 MED ORDER — LEVOFLOXACIN 750 MG PO TABS: 750.0000 mg | ORAL_TABLET | Freq: Every day | ORAL | 0 refills | Status: DC

## 2019-10-04 MED ORDER — LEVOFLOXACIN 750 MG PO TABS
750.0000 mg | ORAL_TABLET | Freq: Once | ORAL | Status: AC
  Administered 2019-10-04: 750 mg via ORAL
  Filled 2019-10-04: qty 1

## 2019-10-04 NOTE — ED Triage Notes (Signed)
Pt in via EMS from home with c/o sob. Pt daughter reports SOB worse at night and cough. 97.7 temp, no know COVID exposure. Pt with hx of dementia.

## 2019-10-04 NOTE — Patient Instructions (Signed)
Visit Information  Goals Addressed              This Visit's Progress     COMPLETED: [name]'s memory is failing (pt-stated)        Current Barriers: Closing this goal as falls have been covered in new goals. Will continue to monitor for falls and changes in memory.   Knowledge Deficits related to falls prevention, decline in memory  Cognitive Deficits  Memory issues  Nurse Case Manager Clinical Goal(s):   Over the next 90 days, patient will work with [facility]  to address needs related to falls prevention and memory improvement  Interventions:   Provided verbal education re: Potential causes of falls and Fall prevention strategies  Assessed for falls since last encounter.-No new falls (04-05-2019): patient compliant using cane Daughter confirms not new falls recently.  08-09-2019: Per the daughter the patient has not had any new falls.   Assessed patients knowledge of fall risk prevention secondary to previously provided education. Review of safety in the home   Evaluation of current treatment plan related to dementia and patient's adherence to plan as established by provider.  Collaborated with PCP and CCM Pharm D  regarding patient's symptoms and daughter's concerns. Both advised to request patient stop Hydroclorathiazide and continue to monitor blood pressure.  The patient remains off of the Hydrochlorothiazide   Discussed plans with patient for ongoing care management follow up and provided patient with direct contact information for care management team  Reviewed episodes of dizziness and light headedness, per the daughter this has gotten a lot better since not taking HCTZ. 08-09-2019: Per the patients daughter the patient has not had any more episodes of dizziness.   She stated he dad does check his blood pressures daily, his blood pressure has been stable  BP Readings from Last 3 Encounters:  07/19/19 (!) 150/75  06/30/19 (!) 163/79  03/02/19 121/78         Patient Self  Care Activities:   [name] (assistive device) appropriately with all ambulation  De-clutter walkways  Change positions slowly  Wear secure fitting shoes at all times with ambulation  Utilize home lighting for dim lit areas  Have self and pet awareness at all times   Patient Self Care Activities:   Currently UNABLE TO independently prevent falls   Attends all scheduled provider appointments  Performs ADL's independently  Please see past updates related to this goal by clicking on the "Past Updates" button in the selected goal        RNCM: Pt's daughter- "the pain is better for a few days after he gets the shots" (pt-stated)        CARE PLAN ENTRY (see longitudinal plan of care for additional care plan information)  Current Barriers:   Knowledge Deficits related to pain relief in patient with chronic knee pain  Care Coordination needs related to pain control in a patient with Chronic knee pain (disease states)  Nurse Case Manager Clinical Goal(s):   Over the next 120 days, patient will verbalize understanding of plan for pain management for chronic knee pain  Over the next 120 days, patient will work with [facility], CCM team, and pcp to address needs related to pain in bilateral knees and best treatment options  Over the next 120 days, patient will demonstrate a decrease in pain exacerbations as evidenced by controlled level of pain in bilateral knees  Over the next 120 days, patient will demonstrate improved health management independence as evidenced byno  falls, ability to do normal activities and improved quality of life  Interventions:   Inter-disciplinary care team collaboration (see longitudinal plan of care)  Evaluation of current treatment plan related to pain management  and patient's adherence to plan as established by provider. 10-04-2019: The patient is having worsening knee pain and is to the point where he can not walk well at all. Denies any new falls.   The patients daughter was getting to call rescue to have the patient transported to the hospital to be evaluated for several things. She was allowing the patient to finish eating.   Advised patient to call the provider for worsening sx/sx of uncontrolled pain or increased level of intensity of pain.  10-04-2019: The patient was seen on 09-21-2019 by the pcp. The patients daughter states that the patient is really having a hard time and the pain is almost unbearable. Education and support given.   Provided education to patient re: alternative pain relief measures. 10-04-2019: The patient daughter endorses the patient is taking medications as directed but she was out of town this weekend and when she came back he has gotten worse. She feels he needs to be evaluated at the hospital.    Reviewed medications with patient and discussed compliance.  The patient states that the shots received on 7-12 were helpful for pain relief. This work for a few days and then the patient generally complains again with pain.  The patient has had this for a while per the daughter and the topical NSAIDs do work and help also. 10-04-2019: Per the daughter the patients pain is not controlled at this time. No falls reported but the patient is going to the hospital via [facility] to evaluated by several things today. Will let pcp know.   Discussed plans with patient for ongoing care management follow up and provided patient with direct contact information for care management team  Patient Self Care Activities:   Patient verbalizes understanding of plan to manage pain of bilateral knees by working with the pcp and CCM team  Calls provider office for new concerns or questions  Unable to independently manage pain and discomfort of bilateral knees  Please see past updates related to this goal by clicking on the "Past Updates" button in the selected goal        RNCM: Recently Dad has had swelling in both of his legs and feet         Current Barriers:   Knowledge Deficits related to management of HTN as evidence of swelling in feet and legs, not always being able to eat a heart healthy diet  Cognitive Deficits  Nurse Case Manager Clinical Goal(s):   Over the next 90 days, patient will verbalize understanding of plan for managing HTN and HLD  Over the next 90 days, patient will work with [facility] [place] pharmacist and pcp to address needs related to managing HTN and increased swelling in feet and legs  Over the next 90 days, patient will demonstrate a decrease in bilateral lower extremity edema exacerbations as evidenced by normalized blood pressures and no evidence of edema present in bilateral lower legs  Over the next 90 days, patient will demonstrate improved adherence to prescribed treatment plan for wearing knee high ted hose as evidenced bydecreased bilateral lower leg edema  Over the next 90 days, patient will work with CM team pharmacist to review medications as needed and take Lasix as prescribed by pcp  Over the next 90 days, the patient will  demonstrate ongoing self health care management ability as evidenced by elevating legs when sitting, taking meds as prescribed, following a heart healthy diet, and wearing ted hose to reduce bilateral lower leg edema.  Interventions:   Evaluation of current treatment plan related to HTN and HLD and patient's adherence to plan as established by provider. 10-04-2019: The daughter states that the patient is having shortness of breath, a cough, and edema in bilateral legs that is worse in his right foot. States the cough has not been productive. Evaluation if the patient has been around anyone that is COVID positive and the daughter states not that she is aware of. The patient has several issues going on with knee pain with limited mobility and GI bleeding that is not better. The patient feels he needs to be evaluated at the hospital and after eating he has requested for his daughter to  call EMS. Empathetic listening and support given.   Provided education to patient re: best way to put on ted hose with the use of a plastic grocery bag for ease of putting on hose. Also alternative compression socks" found on [place] or at [place] that my be beneficial for the patient. 10-04-2019: The patients daughter states that the patient is still having issues putting on the ted hose even with the ideas given to them with the grocery bags.  The daughter did get the patient some diabetic socks.He can put those on. Education given on elevation of legs when sitting and to try ace wraps, just make sure they are not too tight around his legs. The patient is having worsening swelling today and also shortness of breath. The patients daughter was out of town this weekend but endorses compliance with medications. The patient has several health issues impacting his care right now.   Reviewed medications with patient and discussed the use of Lasix. The daughter verbalized the patient did not have any Lasix. The patient has been started back on Lasix and per the daughter today the Lasix has been effective in helping with the swelling and she has noted decrease in swelling. 08-09-2019: the patient is taking lasix 20 mg QOD as prescribed. The patients daughter notes that this is helping with the swelling in his legs.  Discussed plans with patient for ongoing care management follow up and provided patient with direct contact information for care management team  Advised patient, providing education and rationale, to monitor blood pressure daily and record, calling pcp for findings outside established parameters.   Provided patient with heart healthy  educational materials related to adhering to heart healthy diet   Pharmacy referral for medication change follow up and support  Educated daughter on the [place] functionality to view notes/lab work and communicate with the pcp team  Patient Self Care Activities:    Patient verbalizes understanding of plan to use Lasix prn for swelling in feet and legs per MD recommendations  Attends all scheduled provider appointments  Calls provider office for new concerns or questions  Unable to independently manage bilateral lower leg edema  Please see past updates related to this goal by clicking on the "Past Updates" button in the selected goal         Patient verbalizes understanding of instructions provided today.   Telephone follow up appointment with care management team member scheduled for: 11-08-2019 at [place] am  Noreene Larsson RN, MSN, [place] Mobile: [phone]

## 2019-10-04 NOTE — ED Provider Notes (Signed)
[facility] Emergency Department Provider Note   ____________________________________________   First MD Initiated Contact with Patient 10/04/19 2014     (approximate)  I have reviewed the triage vital signs and the nursing notes.   HISTORY  Chief Complaint Nasal Congestion, Cough, and Weakness    HPI ASAR EVILSIZER is a 85 y.o. male with a history of mild dementia, hypertension chronic right knee osteoarthritis  Patient presents today, since roughly Friday he has been noticed to have a dry cough.  No shortness of breath.  Nonproductive.  No fevers or chills.  He has felt a little more fatigued than normal but still eating and drinking and behaving normally.  He is cared for by his daughter who is present.  He lives close by the family as well but has had contact with 2 younger family members who had recent cough or cold-like symptoms and fevers and were kept out of school, but have not had Covid testing.  He has not personally been vaccinated against Covid but has no known direct exposure either.  No abdominal pain.  No nausea or vomiting.  No chest pain.  Denies shortness of breath.   He has had a bit of nasal congestion as well and fatigue.  He has not been recently in the hospital or on any recent antibiotics  Past Medical History:  Diagnosis Date  . Arthritis   . GERD (gastroesophageal reflux disease)   . Hyperlipidemia   . Hypertension   . Lewy body dementia ([place])   . Osteoporosis   . Prostate enlargement     Patient Active Problem List   Diagnosis Date Noted  . Prostate cancer ([place]) 06/30/2019  . BPH with urinary obstruction 11/06/2018  . Acute cholecystitis 09/08/2018  . Myalgia due to statin 02/24/2018  . Tricompartment osteoarthritis of left knee 11/14/2017  . Generalized weakness 04/28/2017  . Chronic low back pain 03/12/2017  . Knee pain, chronic 10/01/2016  . Chronic venous insufficiency 08/30/2016  . BPH without  obstruction/lower urinary tract symptoms 03/06/2016  . Osteoarthritis of multiple joints 03/06/2016  . Degenerative joint disease (DJD) of lumbar spine 03/06/2016  . Hypertension 03/05/2016  . Hyperlipidemia 03/05/2016  . Osteoporosis 03/05/2016  . GERD (gastroesophageal reflux disease) 03/05/2016  . Bilateral lower extremity edema 03/05/2016    Past Surgical History:  Procedure Laterality Date  . CHOLECYSTECTOMY N/A 09/10/2018   Procedure: LAPAROSCOPIC CHOLECYSTECTOMY;  Surgeon: Benjamine Sprague, DO;  Location: [facility] ORS;  Service: General;  Laterality: N/A;  . GANGLION CYST EXCISION Left 2013   Left upper arm  . HERNIA REPAIR N/A 2008   [place], ventral acquired hernia repair  . HOLEP-LASER ENUCLEATION OF THE PROSTATE WITH MORCELLATION N/A 11/06/2018   Procedure: HOLEP-LASER ENUCLEATION OF THE PROSTATE WITH MORCELLATION;  Surgeon: [name], MD;  Location: [facility] ORS;  Service: Urology;  Laterality: N/A;    Prior to Admission medications   Medication Sig Start Date End Date Taking? Authorizing Provider  carbidopa-levodopa (SINEMET IR) 25-100 MG tablet Take 1 tablet by mouth 4 (four) times daily.  07/29/18   [provider]  cholecalciferol (VITAMIN D) 1000 units tablet Take 1,000 Units by mouth daily.    [provider]  Coenzyme Q10 (CO Q 10) 100 MG CAPS Take 1 capsule by mouth daily.     [provider]  diclofenac Sodium (VOLTAREN) 1 % GEL Apply 2 g topically 4 (four) times daily as needed. To knee arthritis pain 07/19/19   [name]  J, DO  enalapril (VASOTEC) 20 MG tablet TAKE 1 TABLET BY MOUTH TWICE A DAY 09/27/19   Karamalegos, Alexander J, DO  fluticasone (FLONASE) 50 MCG/ACT nasal spray Place 2 sprays into both nostrils daily as needed for allergies.     [provider]  furosemide (LASIX) 20 MG tablet Take 1 tablet (20 mg total) by mouth every other day. 06/17/19   Karamalegos, Devonne Doughty, DO  levofloxacin (LEVAQUIN) 750 MG tablet  Take 1 tablet (750 mg total) by mouth daily. 10/04/19   Delman Kitten, MD  MELATONIN PO Take 1 tablet by mouth at bedtime.    [provider]  Omega-3 Fatty Acids (FISH OIL) 1200 MG CAPS Take 1,200 mg by mouth 2 (two) times daily.     [provider]  omeprazole (PRILOSEC) 20 MG capsule Take 20 mg by mouth daily.    [provider]  predniSONE (DELTASONE) 20 MG tablet Take daily with food. Start with 60mg  (3 pills) x 2 days, then reduce to 40mg  (2 pills) x 2 days, then 20mg  (1 pill) x 3 days 09/21/19   [name], DO  vitamin C (ASCORBIC ACID) 500 MG tablet Take 500 mg by mouth daily.    [provider]    Allergies Antihistamines, chlorpheniramine-type  Family History  Problem Relation Age of Onset  . Breast cancer Mother   . Lung cancer Father   . Heart disease Brother   . Leukemia Brother   . Prostate cancer Neg Hx   . Colon cancer Neg Hx   . Bladder Cancer Neg Hx   . Kidney cancer Neg Hx     Social History Social History   Tobacco Use  . Smoking status: Former Smoker    Packs/day: 1.00    Years: 12.00    Pack years: 12.00    Types: Cigarettes    Quit date: 1968    Years since quitting: 53.7  . Smokeless tobacco: Never Used  Vaping Use  . Vaping Use: Never used  Substance Use Topics  . Alcohol use: No  . Drug use: No    Review of Systems Constitutional: No fever/chills.  Has felt slightly more fatigued than typical the last 2 days Eyes: No visual changes. ENT: No sore throat. Cardiovascular: Denies chest pain. Respiratory: Denies shortness of breath.  At triage is not possible shortness of breath reported, but both patient and his daughter report he is not had shortness of breath but rather has had a frequent cough and congestion nasally. Gastrointestinal: No abdominal pain.   Genitourinary: Negative for dysuria.  No abnormal odor or discomfort with urination. Musculoskeletal: Negative for back pain.  He has chronic  leg swelling, no more than typical. Skin: Negative for rash. Neurological: Negative for headaches, areas of focal weakness or numbness.    ____________________________________________   PHYSICAL EXAM:  VITAL SIGNS: ED Triage Vitals  Enc Vitals Group     BP 10/04/19 1249 (!) 144/70     Pulse Rate 10/04/19 1249 74     Resp 10/04/19 1249 18     Temp 10/04/19 1249 98.3 F (36.8 C)     Temp Source 10/04/19 1249 Oral     SpO2 10/04/19 1249 95 %     Weight 10/04/19 1251 240 lb (108.9 kg)     Height 10/04/19 1251 6' (1.829 m)     Head Circumference --      Peak Flow --      Pain Score --  Pain Loc --      Pain Edu? --      Excl. in [place]? --     Constitutional: Alert and oriented to month as well as year, but not today and daughter reports its not unusual. Well appearing and in no acute distress.  She appears somewhat chronically ill but in no distress.  Fully alert conversant and very pleasant.  Daughter at the bedside also very pleasant. Eyes: Conjunctivae are normal. Head: Atraumatic. Nose: No congestion/rhinnorhea. Mouth/Throat: Mucous membranes are moist. Neck: No stridor.  Cardiovascular: Normal rate, regular rhythm. Grossly normal heart sounds.  Good peripheral circulation. Respiratory: Normal respiratory effort.  No retractions. Lungs CTAB except for slight crackles noted in the left lower lung base. Gastrointestinal: Soft and nontender. No distention. Musculoskeletal: No lower extremity tenderness 1+ bilateral lower extremity edema, also venous stasis lesions.  No associated cellulitic changes.  No tenderness to palpation along the back or spine, reports some very slight point tenderness along the right lower rib costal margin, but denies being in any pain or discomfort in this area except to palpation focally.  Reports this is the kind of pain he will experience when he moves about her shift sometimes will get pain there.  No CVA tenderness bilateral Neurologic:  Normal  speech and language. No gross focal neurologic deficits are appreciated.  Skin:  Skin is warm, dry and intact. No rash noted. Psychiatric: Mood and affect are normal. Speech and behavior are normal.  ____________________________________________   LABS (all labs ordered are listed, but only abnormal results are displayed)  Labs Reviewed  BASIC METABOLIC PANEL - Abnormal; Notable for the following components:      Result Value   Glucose, Bld 101 (*)    Calcium 8.8 (*)    All other components within normal limits  CBC - Abnormal; Notable for the following components:   RDW 15.6 (*)    All other components within normal limits  URINALYSIS, COMPLETE (UACMP) WITH MICROSCOPIC - Abnormal; Notable for the following components:   Color, Urine YELLOW (*)    APPearance CLOUDY (*)    Protein, ur 30 (*)    Nitrite POSITIVE (*)    Leukocytes,Ua LARGE (*)    WBC, UA >50 (*)    Bacteria, UA RARE (*)    All other components within normal limits  RESPIRATORY PANEL BY RT PCR (FLU A&B, COVID)  URINE CULTURE  CBG MONITORING, ED   ____________________________________________  EKG  Reviewed interpreted at 1300 Heart rate 80 QRS 120 QTc 450 Normal sinus rhythm, left axis with evidence of LVH.  No evidence of acute ischemia ____________________________________________  RADIOLOGY  DG Chest 2 View  Result Date: 10/04/2019 CLINICAL DATA:  Cough, shortness of breath EXAM: CHEST - 2 VIEW COMPARISON:  05/07/2017 FINDINGS: The heart size and mediastinal contours are within normal limits. Atherosclerotic calcification of the aortic knob. Focal opacity within the periphery of the left lung base. Right lung is clear. No pleural effusion or pneumothorax. Mild wedging of a vertebral body within the midthoracic spine, age indeterminate, but likely chronic. Degenerative changes of the bilateral AC joints. IMPRESSION: Focal opacity within the periphery of the left lung base, which may represent atelectasis or  pneumonia. Radiographic follow-up after appropriate treatment is recommended to document resolution. Electronically Signed   By: Davina Poke D.O.   On: 10/04/2019 13:29     Imaging results reviewed, opacity left lung base concerning for possible pneumonia ____________________________________________   PROCEDURES  Procedure(s) performed: None  Procedures  Critical Care performed: No  ____________________________________________   INITIAL IMPRESSION / ASSESSMENT AND PLAN / ED COURSE  Pertinent labs & imaging results that were available during my care of the patient were reviewed by me and considered in my medical decision making (see chart for details).   Patient presents for evaluation of fatigue associated with cough and nasal congestion.  He has been recently around 2 grandchildren who have had somewhat similar illness over the last week or 2.  1 of whom is being tested but not yet resulted for COVID-19.  This does appear to be a potential risk factor, but in review of his clinical history as well with cough and rales along the left lower base I suspect community-acquired pneumonia.  He does not appear to have healthcare associated pneumonia risk factors  No chest pain.  No elevated work of breathing.  He denies shortness of breath.  Very reassuring clinical examination.  Patient does have mild dementia but receives significant assistance at home, he is continue to eat and drink well.  Does not show evidence of sepsis.  Clinical Course as of Oct 03 2052  Mon Oct 04, 2019  2031 Urinalysis reviewed, concerning for possible UTI.  However, patient and daughter reports that he has not had any urinary symptoms.  He has had some occasional "catching" flank pain but this is been present for likely months and it only occurs when he is getting up to transfer.  Low   [MQ]  2032 Patient on room air saturation 95%.  No hypoxia.  Normal white count, reassuring CBC and BMP.  I do not see  evidence of sepsis.  The patient appears nontoxic and well perfused.  Will start Levaquin   [MQ]  2032 Dose, also obtain pfor possible left lower lobe pneumonia given associated cough and she testing for Covid and influenza is patient also is not vaccinated against Covid.  He did have 2 family members who he is around recently that had cough colds and fevers in the last couple of weeks, 1 of whom received a Covid test today but have not received results back   [MQ]    Clinical Course User Index [MQ] Delman Kitten, MD    ----------------------------------------- 8:50 PM on 10/04/2019 -----------------------------------------  Ongoing care including follow-up on Covid and influenza swabs which are pending assigned to Dr. Archie Balboa.  Patient continues to do well on reassessment, negative for Covid/influenza, anticipate likely be able to discharge home with Levaquin.  If positive for Covid or influenza, would reassess for possible discharge but likely will need follow-up with infusion clinic if Covid positive   ____________________________________________   FINAL CLINICAL IMPRESSION(S) / ED DIAGNOSES  Final diagnoses:  Community acquired pneumonia of left lower lobe of lung        Note:  This document was prepared using Systems analyst and may include unintentional dictation errors    Delman Kitten, MD 10/04/19 2054

## 2019-10-04 NOTE — Chronic Care Management (AMB) (Signed)
Chronic Care Management   Follow Up Note   10/04/2019 Name: HERACLIO SEIDMAN MRN: [id number] DOB: 02-02-1934  Referred by: [name], DO Reason for referral : Chronic Care Management (RNCM follow up for Chronic Disease Management and  Care Coordination Needs)   VAIBHAV FOGLEMAN is a 85 y.o. year old male who is a primary care patient of [name], DO. The CCM team was consulted for assistance with chronic disease management and care coordination needs.    Review of patient status, including review of consultants reports, relevant laboratory and other test results, and collaboration with appropriate care team members and the patient's provider was performed as part of comprehensive patient evaluation and provision of chronic care management services.    SDOH (Social Determinants of Health) assessments performed: Yes See Care Plan activities for detailed interventions related to [facility])     Outpatient Encounter Medications as of 10/04/2019  Medication Sig  . carbidopa-levodopa (SINEMET IR) 25-100 MG tablet Take 1 tablet by mouth 4 (four) times daily.   . cholecalciferol (VITAMIN D) 1000 units tablet Take 1,000 Units by mouth daily.  . Coenzyme Q10 (CO Q 10) 100 MG CAPS Take 1 capsule by mouth daily.   . diclofenac Sodium (VOLTAREN) 1 % GEL Apply 2 g topically 4 (four) times daily as needed. To knee arthritis pain  . enalapril (VASOTEC) 20 MG tablet TAKE 1 TABLET BY MOUTH TWICE A DAY  . fluticasone (FLONASE) 50 MCG/ACT nasal spray Place 2 sprays into both nostrils daily as needed for allergies.   . furosemide (LASIX) 20 MG tablet Take 1 tablet (20 mg total) by mouth every other day.  [name] MELATONIN PO Take 1 tablet by mouth at bedtime.  . Omega-3 Fatty Acids (FISH OIL) 1200 MG CAPS Take 1,200 mg by mouth 2 (two) times daily.   [name] omeprazole (PRILOSEC) 20 MG capsule Take 20 mg by mouth daily.  . predniSONE (DELTASONE) 20 MG tablet Take daily with food. Start with 60mg  (3  pills) x 2 days, then reduce to 40mg  (2 pills) x 2 days, then 20mg  (1 pill) x 3 days  . vitamin C (ASCORBIC ACID) 500 MG tablet Take 500 mg by mouth daily.   No facility-administered encounter medications on file as of 10/04/2019.     Objective:  BP Readings from Last 3 Encounters:  09/21/19 (!) 155/80  09/15/19 (!) 151/87  07/19/19 (!) 150/75    Goals Addressed              This Visit's Progress   .  COMPLETED: [name]'s memory is failing (pt-stated)        Current Barriers: Closing this goal as falls have been covered in new goals. Will continue to monitor for falls and changes in memory.  . Knowledge Deficits related to falls prevention, decline in memory . Cognitive Deficits . Memory issues  Nurse Case Manager Clinical Goal(s):  [name] Over the next 90 days, patient will work with [facility]  to address needs related to falls prevention and memory improvement  Interventions:  . Provided verbal education re: Potential causes of falls and Fall prevention strategies . Assessed for falls since last encounter.-No new falls (04-05-2019): patient compliant using cane Daughter confirms not new falls recently.  08-09-2019: Per the daughter the patient has not had any new falls.  . Assessed patients knowledge of fall risk prevention secondary to previously provided education. Review of safety in the home  . Evaluation of current treatment plan related to  dementia and patient's adherence to plan as established by provider. [name] with PCP and CCM Pharm D  regarding patient's symptoms and daughter's concerns. Both advised to request patient stop Hydroclorathiazide and continue to monitor blood pressure.  The patient remains off of the Hydrochlorothiazide  . Discussed plans with patient for ongoing care management follow up and provided patient with direct contact information for care management team . Reviewed episodes of dizziness and light headedness, per the daughter this has gotten a lot  better since not taking HCTZ. 08-09-2019: Per the patients daughter the patient has not had any more episodes of dizziness.  . She stated he dad does check his blood pressures daily, his blood pressure has been stable  BP Readings from Last 3 Encounters:  07/19/19 (!) 150/75  06/30/19 (!) 163/79  03/02/19 121/78         Patient Self Care Activities:  . [name] (assistive device) appropriately with all ambulation . De-clutter walkways . Change positions slowly . Wear secure fitting shoes at all times with ambulation . Utilize home lighting for dim lit areas . Have self and pet awareness at all times   Patient Self Care Activities:  . Currently UNABLE TO independently prevent falls  . Attends all scheduled provider appointments . Performs ADL's independently  Please see past updates related to this goal by clicking on the "Past Updates" button in the selected goal      .  RNCM: Pt's daughter- "the pain is better for a few days after he gets the shots" (pt-stated)        CARE PLAN ENTRY (see longitudinal plan of care for additional care plan information)  Current Barriers:  [name] Knowledge Deficits related to pain relief in patient with chronic knee pain . Care Coordination needs related to pain control in a patient with Chronic knee pain (disease states)  Nurse Case Manager Clinical Goal(s):  [name] Over the next 120 days, patient will verbalize understanding of plan for pain management for chronic knee pain . Over the next 120 days, patient will work with [facility] [place] team, and pcp to address needs related to pain in bilateral knees and best treatment options . Over the next 120 days, patient will demonstrate a decrease in pain exacerbations as evidenced by controlled level of pain in bilateral knees . Over the next 120 days, patient will demonstrate improved health management independence as evidenced byno falls, ability to do normal activities and improved quality of  life  Interventions:  . Inter-disciplinary care team collaboration (see longitudinal plan of care) . Evaluation of current treatment plan related to pain management  and patient's adherence to plan as established by provider. 10-04-2019: The patient is having worsening knee pain and is to the point where he can not walk well at all. Denies any new falls.  The patients daughter was getting to call rescue to have the patient transported to the hospital to be evaluated for several things. She was allowing the patient to finish eating.  . Advised patient to call the provider for worsening sx/sx of uncontrolled pain or increased level of intensity of pain.  10-04-2019: The patient was seen on 09-21-2019 by the pcp. The patients daughter states that the patient is really having a hard time and the pain is almost unbearable. Education and support given.  . Provided education to patient re: alternative pain relief measures. 10-04-2019: The patient daughter endorses the patient is taking medications as directed but she was out of town  this weekend and when she came back he has gotten worse. She feels he needs to be evaluated at the hospital.   . Reviewed medications with patient and discussed compliance.  The patient states that the shots received on 7-12 were helpful for pain relief. This work for a few days and then the patient generally complains again with pain.  The patient has had this for a while per the daughter and the topical NSAIDs do work and help also. 10-04-2019: Per the daughter the patients pain is not controlled at this time. No falls reported but the patient is going to the hospital via [facility] to evaluated by several things today. Will let pcp know.  . Discussed plans with patient for ongoing care management follow up and provided patient with direct contact information for care management team  Patient Self Care Activities:  . Patient verbalizes understanding of plan to manage pain of bilateral knees by  working with the pcp and CCM team . Calls provider office for new concerns or questions . Unable to independently manage pain and discomfort of bilateral knees  Please see past updates related to this goal by clicking on the "Past Updates" button in the selected goal      .  RNCM: Recently Dad has had swelling in both of his legs and feet        Current Barriers:  [name] Knowledge Deficits related to management of HTN as evidence of swelling in feet and legs, not always being able to eat a heart healthy diet . Cognitive Deficits  Nurse Case Manager Clinical Goal(s):  [name] Over the next 90 days, patient will verbalize understanding of plan for managing HTN and HLD . Over the next 90 days, patient will work with [facility] [place] pharmacist and pcp to address needs related to managing HTN and increased swelling in feet and legs . Over the next 90 days, patient will demonstrate a decrease in bilateral lower extremity edema exacerbations as evidenced by normalized blood pressures and no evidence of edema present in bilateral lower legs . Over the next 90 days, patient will demonstrate improved adherence to prescribed treatment plan for wearing knee high ted hose as evidenced bydecreased bilateral lower leg edema . Over the next 90 days, patient will work with CM team pharmacist to review medications as needed and take Lasix as prescribed by pcp . Over the next 90 days, the patient will demonstrate ongoing self health care management ability as evidenced by elevating legs when sitting, taking meds as prescribed, following a heart healthy diet, and wearing ted hose to reduce bilateral lower leg edema.  Interventions:  . Evaluation of current treatment plan related to HTN and HLD and patient's adherence to plan as established by provider. 10-04-2019: The daughter states that the patient is having shortness of breath, a cough, and edema in bilateral legs that is worse in his right foot. States the cough has not been  productive. Evaluation if the patient has been around anyone that is COVID positive and the daughter states not that she is aware of. The patient has several issues going on with knee pain with limited mobility and GI bleeding that is not better. The patient feels he needs to be evaluated at the hospital and after eating he has requested for his daughter to call EMS. Empathetic listening and support given.  . Provided education to patient re: best way to put on ted hose with the use of a plastic grocery bag for ease of  putting on hose. Also alternative compression socks" found on [place] or at [place] that my be beneficial for the patient. 10-04-2019: The patients daughter states that the patient is still having issues putting on the ted hose even with the ideas given to them with the grocery bags.  The daughter did get the patient some diabetic socks.He can put those on. Education given on elevation of legs when sitting and to try ace wraps, just make sure they are not too tight around his legs. The patient is having worsening swelling today and also shortness of breath. The patients daughter was out of town this weekend but endorses compliance with medications. The patient has several health issues impacting his care right now.  . Reviewed medications with patient and discussed the use of Lasix. The daughter verbalized the patient did not have any Lasix. The patient has been started back on Lasix and per the daughter today the Lasix has been effective in helping with the swelling and she has noted decrease in swelling. 08-09-2019: the patient is taking lasix 20 mg QOD as prescribed. The patients daughter notes that this is helping with the swelling in his legs. . Discussed plans with patient for ongoing care management follow up and provided patient with direct contact information for care management team . Advised patient, providing education and rationale, to monitor blood pressure daily and record, calling pcp  for findings outside established parameters.  . Provided patient with heart healthy  educational materials related to adhering to heart healthy diet  . Pharmacy referral for medication change follow up and support . Educated daughter on the [place] functionality to view notes/lab work and communicate with the pcp team  Patient Self Care Activities:  . Patient verbalizes understanding of plan to use Lasix prn for swelling in feet and legs per MD recommendations . Attends all scheduled provider appointments . Calls provider office for new concerns or questions . Unable to independently manage bilateral lower leg edema  Please see past updates related to this goal by clicking on the "Past Updates" button in the selected goal          Plan:   Telephone follow up appointment with care management team member scheduled for: 11-08-2019 at [place] am   [place], MSN, [facility] Mobile: [phone]

## 2019-10-04 NOTE — ED Triage Notes (Addendum)
Pt presents to ED via EMS with c/o nasal congestion, cough, SOB after coughing, increasing weakness, and increasing R knee pain that is causing patient to need increasing amounts of help with ambulation and ADL's. Pt with hx of dementia at this time. VSS at this time. Pt visualized in NAD at this time.   Pt's daughter also reports R side pain with turning and blood stools, reports pt has hx of hemorrhoids but is not sure if blood is from that.

## 2019-10-04 NOTE — Discharge Instructions (Addendum)
We believe that your symptoms are caused today by pneumonia, an infection in your lung(s).  Fortunately you should start to improve quickly after taking your antibiotics.  Please take the full course of antibiotics as prescribed and drink plenty of fluids.    Follow up with your doctor within 1-2 days.  If you develop any new or worsening symptoms, including but not limited to fever in spite of taking over-the-counter Tylenol, persistent vomiting, worsening shortness of breath, or other symptoms that concern you, please return to the Emergency Department immediately.

## 2019-10-05 ENCOUNTER — Encounter: Payer: Self-pay | Admitting: Nurse Practitioner

## 2019-10-05 ENCOUNTER — Telehealth: Payer: Self-pay | Admitting: Family Medicine

## 2019-10-05 ENCOUNTER — Other Ambulatory Visit: Payer: Self-pay | Admitting: Nurse Practitioner

## 2019-10-05 DIAGNOSIS — N400 Enlarged prostate without lower urinary tract symptoms: Secondary | ICD-10-CM

## 2019-10-05 DIAGNOSIS — C61 Malignant neoplasm of prostate: Secondary | ICD-10-CM

## 2019-10-05 DIAGNOSIS — F0280 Dementia in other diseases classified elsewhere without behavioral disturbance: Secondary | ICD-10-CM

## 2019-10-05 DIAGNOSIS — R531 Weakness: Secondary | ICD-10-CM

## 2019-10-05 DIAGNOSIS — I10 Essential (primary) hypertension: Secondary | ICD-10-CM

## 2019-10-05 DIAGNOSIS — G3183 Dementia with Lewy bodies: Secondary | ICD-10-CM

## 2019-10-05 DIAGNOSIS — E782 Mixed hyperlipidemia: Secondary | ICD-10-CM

## 2019-10-05 DIAGNOSIS — U071 COVID-19: Secondary | ICD-10-CM

## 2019-10-05 LAB — URINE CULTURE

## 2019-10-05 NOTE — Progress Notes (Signed)
I connected by phone with Randy [name] on 10/05/2019 at 12:46 PM to discuss the potential use of a new treatment for mild to moderate COVID-19 viral infection in non-hospitalized patients.  This patient is a 85 y.o. male that meets the FDA criteria for Emergency Use Authorization of COVID monoclonal antibody casirivimab/imdevimab or bamlanivimab/eteseviamb.  Has a (+) direct SARS-CoV-2 viral test result  Has mild or moderate COVID-19   Is NOT hospitalized due to COVID-19  Is within 10 days of symptom onset  Has at least one of the high risk factor(s) for progression to severe COVID-19 and/or hospitalization as defined in EUA.  Specific high risk criteria : Older age (>/= 83 yo), BMI > 25, Diabetes, Cardiovascular disease or hypertension and Neurodevelopmental disorder   I have spoken and communicated the following to the patient or parent/caregiver regarding COVID monoclonal antibody treatment:  1. FDA has authorized the emergency use for the treatment of mild to moderate COVID-19 in adults and pediatric patients with positive results of direct SARS-CoV-2 viral testing who are 83 years of age and older weighing at least 40 kg, and who are at high risk for progressing to severe COVID-19 and/or hospitalization.  2. The significant known and potential risks and benefits of COVID monoclonal antibody, and the extent to which such potential risks and benefits are unknown.  3. Information on available alternative treatments and the risks and benefits of those alternatives, including clinical trials.  4. Patients treated with COVID monoclonal antibody should continue to self-isolate and use infection control measures (e.g., wear mask, isolate, social distance, avoid sharing personal items, clean and disinfect "high touch" surfaces, and frequent handwashing) according to CDC guidelines.   5. The patient or parent/caregiver has the option to accept or refuse COVID monoclonal antibody treatment.   After reviewing this information with the patient, the patient has agreed to receive one of the available covid 19 monoclonal antibodies and will be provided an appropriate fact sheet prior to infusion.   Patients daughter provided permission in the setting of Dementia in patient.  Orma Render, NP 10/05/2019 12:46 PM

## 2019-10-05 NOTE — Telephone Encounter (Signed)
   [facility] 10/05/2019   Name: Randy [name]   MRN: [id number]   DOB: August 19, 1934   AGE: 85 y.o.   GENDER: male   PCP [name], DO.   Spoke with patient's daughter [name] regarding [employer] Referral for home health aide. [name] stated that she will have to give the Care Guide a back tomorrow 10/06/2019.  Care Guide stated that was fine. Will wait to hear back from patient's daughter.    [place], Care Management Phone: [phone] Email: [email or web address]

## 2019-10-06 ENCOUNTER — Ambulatory Visit (HOSPITAL_COMMUNITY)
Admission: RE | Admit: 2019-10-06 | Discharge: 2019-10-06 | Disposition: A | Discharge status: Home / Self Care | Payer: Medicare Other | Source: Ambulatory Visit | Attending: Pulmonary Disease | Admitting: Pulmonary Disease

## 2019-10-06 ENCOUNTER — Encounter: Payer: Self-pay | Admitting: Family Medicine

## 2019-10-06 ENCOUNTER — Other Ambulatory Visit: Payer: Self-pay

## 2019-10-06 ENCOUNTER — Telehealth (INDEPENDENT_AMBULATORY_CARE_PROVIDER_SITE_OTHER): Payer: Medicare Other | Admitting: Family Medicine

## 2019-10-06 DIAGNOSIS — C61 Malignant neoplasm of prostate: Secondary | ICD-10-CM | POA: Insufficient documentation

## 2019-10-06 DIAGNOSIS — Z5321 Procedure and treatment not carried out due to patient leaving prior to being seen by health care provider: Secondary | ICD-10-CM

## 2019-10-06 DIAGNOSIS — F0280 Dementia in other diseases classified elsewhere without behavioral disturbance: Secondary | ICD-10-CM | POA: Diagnosis present

## 2019-10-06 DIAGNOSIS — I10 Essential (primary) hypertension: Secondary | ICD-10-CM

## 2019-10-06 DIAGNOSIS — Z23 Encounter for immunization: Secondary | ICD-10-CM | POA: Insufficient documentation

## 2019-10-06 DIAGNOSIS — U071 COVID-19: Secondary | ICD-10-CM

## 2019-10-06 DIAGNOSIS — R531 Weakness: Secondary | ICD-10-CM | POA: Insufficient documentation

## 2019-10-06 DIAGNOSIS — E782 Mixed hyperlipidemia: Secondary | ICD-10-CM

## 2019-10-06 DIAGNOSIS — N400 Enlarged prostate without lower urinary tract symptoms: Secondary | ICD-10-CM

## 2019-10-06 DIAGNOSIS — G3183 Dementia with Lewy bodies: Secondary | ICD-10-CM | POA: Diagnosis present

## 2019-10-06 MED ORDER — METHYLPREDNISOLONE SODIUM SUCC 125 MG IJ SOLR: 125.0000 mg | Freq: Once | INTRAMUSCULAR | Status: DC | PRN

## 2019-10-06 MED ORDER — ALBUTEROL SULFATE HFA 108 (90 BASE) MCG/ACT IN AERS: 2.0000 | INHALATION_SPRAY | Freq: Once | RESPIRATORY_TRACT | Status: DC | PRN

## 2019-10-06 MED ORDER — SODIUM CHLORIDE 0.9 % IV SOLN: INTRAVENOUS | Status: DC | PRN

## 2019-10-06 MED ORDER — DIPHENHYDRAMINE HCL 50 MG/ML IJ SOLN: 50.0000 mg | Freq: Once | INTRAMUSCULAR | Status: DC | PRN

## 2019-10-06 MED ORDER — EPINEPHRINE 0.3 MG/0.3ML IJ SOAJ: 0.3000 mg | Freq: Once | INTRAMUSCULAR | Status: DC | PRN

## 2019-10-06 MED ORDER — FAMOTIDINE IN NACL 20-0.9 MG/50ML-% IV SOLN: 20.0000 mg | Freq: Once | INTRAVENOUS | Status: DC | PRN

## 2019-10-06 MED ORDER — SODIUM CHLORIDE 0.9 % IV SOLN
1200.0000 mg | Freq: Once | INTRAVENOUS | Status: AC
  Administered 2019-10-06: 1200 mg via INTRAVENOUS

## 2019-10-06 NOTE — Progress Notes (Signed)
  Diagnosis: COVID-19  Physician: Joya Gaskins, MD  Procedure: [facility] Med: casirivimab\imdevimab infusion - Provided patient with casirivimab\imdevimab fact sheet for patients, parents and caregivers prior to infusion.  Complications: No immediate complications noted.  Discharge: Discharged home   [place] 10/06/2019

## 2019-10-06 NOTE — Discharge Instructions (Signed)
10 Things You Can Do to Manage Your COVID-19 Symptoms at Home If you have possible or confirmed COVID-19: 1. Stay home from work and school. And stay away from other public places. If you must go out, avoid using any kind of public transportation, ridesharing, or taxis. 2. Monitor your symptoms carefully. If your symptoms get worse, call your healthcare provider immediately. 3. Get rest and stay hydrated. 4. If you have a medical appointment, call the healthcare provider ahead of time and tell them that you have or may have COVID-19. 5. For medical emergencies, call 911 and notify the dispatch personnel that you have or may have COVID-19. 6. Cover your cough and sneezes with a tissue or use the inside of your elbow. 7. Wash your hands often with soap and water for at least 20 seconds or clean your hands with an alcohol-based hand sanitizer that contains at least 60% alcohol. 8. As much as possible, stay in a specific room and away from other people in your home. Also, you should use a separate bathroom, if available. If you need to be around other people in or outside of the home, wear a mask. 9. Avoid sharing personal items with other people in your household, like dishes, towels, and bedding. 10. Clean all surfaces that are touched often, like counters, tabletops, and doorknobs. Use household cleaning sprays or wipes according to the label instructions. [email or web address] 07/08/2018 This information is not intended to replace advice given to you by your health care provider. Make sure you discuss any questions you have with your health care provider. Document Revised: 12/10/2018 Document Reviewed: 12/10/2018 Elsevier Patient Education  [place]. What types of side effects do monoclonal antibody drugs cause?  Common side effects  In general, the more common side effects caused by monoclonal antibody drugs include: . Allergic reactions, such as hives or itching . Flu-like signs and  symptoms, including chills, fatigue, fever, and muscle aches and pains . Nausea, vomiting . Diarrhea . Skin rashes . Low blood pressure   The CDC is recommending patients who receive monoclonal antibody treatments wait at least 90 days before being vaccinated.  Currently, there are no data on the safety and efficacy of mRNA COVID-19 vaccines in persons who received monoclonal antibodies or convalescent plasma as part of COVID-19 treatment. Based on the estimated half-life of such therapies as well as evidence suggesting that reinfection is uncommon in the 90 days after initial infection, vaccination should be deferred for at least 90 days, as a precautionary measure until additional information becomes available, to avoid interference of the antibody treatment with vaccine-induced immune responses. What types of side effects do monoclonal antibody drugs cause?  Common side effects  In general, the more common side effects caused by monoclonal antibody drugs include: . Allergic reactions, such as hives or itching . Flu-like signs and symptoms, including chills, fatigue, fever, and muscle aches and pains . Nausea, vomiting . Diarrhea . Skin rashes . Low blood pressure   The CDC is recommending patients who receive monoclonal antibody treatments wait at least 90 days before being vaccinated.  Currently, there are no data on the safety and efficacy of mRNA COVID-19 vaccines in persons who received monoclonal antibodies or convalescent plasma as part of COVID-19 treatment. Based on the estimated half-life of such therapies as well as evidence suggesting that reinfection is uncommon in the 90 days after initial infection, vaccination should be deferred for at least 90 days, as a precautionary measure until additional information  becomes available, to avoid interference of the antibody treatment with vaccine-induced immune responses.

## 2019-10-07 ENCOUNTER — Emergency Department: Payer: Medicare Other

## 2019-10-07 ENCOUNTER — Other Ambulatory Visit: Payer: Self-pay

## 2019-10-07 DIAGNOSIS — Z8249 Family history of ischemic heart disease and other diseases of the circulatory system: Secondary | ICD-10-CM

## 2019-10-07 DIAGNOSIS — N400 Enlarged prostate without lower urinary tract symptoms: Secondary | ICD-10-CM | POA: Diagnosis present

## 2019-10-07 DIAGNOSIS — G3183 Dementia with Lewy bodies: Secondary | ICD-10-CM | POA: Diagnosis present

## 2019-10-07 DIAGNOSIS — E785 Hyperlipidemia, unspecified: Secondary | ICD-10-CM | POA: Diagnosis present

## 2019-10-07 DIAGNOSIS — F0280 Dementia in other diseases classified elsewhere without behavioral disturbance: Secondary | ICD-10-CM | POA: Diagnosis present

## 2019-10-07 DIAGNOSIS — I10 Essential (primary) hypertension: Secondary | ICD-10-CM | POA: Diagnosis present

## 2019-10-07 DIAGNOSIS — Z79890 Hormone replacement therapy (postmenopausal): Secondary | ICD-10-CM

## 2019-10-07 DIAGNOSIS — M810 Age-related osteoporosis without current pathological fracture: Secondary | ICD-10-CM | POA: Diagnosis present

## 2019-10-07 DIAGNOSIS — C61 Malignant neoplasm of prostate: Secondary | ICD-10-CM | POA: Diagnosis present

## 2019-10-07 DIAGNOSIS — M1990 Unspecified osteoarthritis, unspecified site: Secondary | ICD-10-CM | POA: Diagnosis present

## 2019-10-07 DIAGNOSIS — L89321 Pressure ulcer of left buttock, stage 1: Secondary | ICD-10-CM | POA: Diagnosis present

## 2019-10-07 DIAGNOSIS — E876 Hypokalemia: Secondary | ICD-10-CM | POA: Diagnosis present

## 2019-10-07 DIAGNOSIS — R296 Repeated falls: Secondary | ICD-10-CM | POA: Diagnosis present

## 2019-10-07 DIAGNOSIS — R531 Weakness: Secondary | ICD-10-CM | POA: Diagnosis not present

## 2019-10-07 DIAGNOSIS — U071 COVID-19: Principal | ICD-10-CM | POA: Diagnosis present

## 2019-10-07 DIAGNOSIS — G9341 Metabolic encephalopathy: Secondary | ICD-10-CM | POA: Diagnosis present

## 2019-10-07 DIAGNOSIS — Z79899 Other long term (current) drug therapy: Secondary | ICD-10-CM

## 2019-10-07 DIAGNOSIS — K219 Gastro-esophageal reflux disease without esophagitis: Secondary | ICD-10-CM | POA: Diagnosis present

## 2019-10-07 DIAGNOSIS — Z87891 Personal history of nicotine dependence: Secondary | ICD-10-CM

## 2019-10-07 DIAGNOSIS — M5136 Other intervertebral disc degeneration, lumbar region: Secondary | ICD-10-CM | POA: Diagnosis present

## 2019-10-07 DIAGNOSIS — Z888 Allergy status to other drugs, medicaments and biological substances status: Secondary | ICD-10-CM

## 2019-10-07 LAB — CBC
HCT: 36.5 % — ABNORMAL LOW (ref 39.0–52.0)
Hemoglobin: 13 g/dL (ref 13.0–17.0)
MCH: 30.5 pg (ref 26.0–34.0)
MCHC: 35.6 g/dL (ref 30.0–36.0)
MCV: 85.7 fL (ref 80.0–100.0)
Platelets: 151 10*3/uL (ref 150–400)
RBC: 4.26 MIL/uL (ref 4.22–5.81)
RDW: 15.5 % (ref 11.5–15.5)
WBC: 5.1 10*3/uL (ref 4.0–10.5)
nRBC: 0 % (ref 0.0–0.2)

## 2019-10-07 LAB — BASIC METABOLIC PANEL
Anion gap: 13 (ref 5–15)
BUN: 22 mg/dL (ref 8–23)
CO2: 23 mmol/L (ref 22–32)
Calcium: 8.3 mg/dL — ABNORMAL LOW (ref 8.9–10.3)
Chloride: 100 mmol/L (ref 98–111)
Creatinine, Ser: 0.9 mg/dL (ref 0.61–1.24)
GFR calc Af Amer: >60 mL/min (ref >60)
GFR calc non Af Amer: >60 mL/min (ref >60)
Glucose, Bld: 105 mg/dL — ABNORMAL HIGH (ref 70–99)
Potassium: 3.6 mmol/L (ref 3.5–5.1)
Sodium: 136 mmol/L (ref 135–145)

## 2019-10-07 LAB — TROPONIN I (HIGH SENSITIVITY): Troponin I (High Sensitivity): 15 ng/L (ref <18)

## 2019-10-07 NOTE — Progress Notes (Signed)
Patient's daughter [name] was contacted by our staff for initial rooming/intake history, patient was resting. However when provider called after appointment time and again later in day - no answer, phone straight to voicemail, unable to reach, no medical visit conducted today, will re-schedule if they call back.  [name], [facility] 10/06/2019

## 2019-10-07 NOTE — ED Triage Notes (Signed)
Please see first nurse note. NAD noted at this time

## 2019-10-07 NOTE — ED Notes (Signed)
Pt comes into the ED via [facility] from home c/o COVID + and weakness.  Pt's daughter normally takes care of him but she is too weak to take care of him currently so she called EMS.  Pt currently being treated for UTI and is altered at baseline.  CBg 114. VSS.

## 2019-10-08 ENCOUNTER — Inpatient Hospital Stay
Admission: EM | Admit: 2019-10-08 | Discharge: 2019-10-12 | DRG: 177 | Disposition: A | Discharge status: Skilled Nursing Facility | Payer: Medicare Other | Attending: Internal Medicine | Admitting: Internal Medicine

## 2019-10-08 DIAGNOSIS — C61 Malignant neoplasm of prostate: Secondary | ICD-10-CM | POA: Diagnosis present

## 2019-10-08 DIAGNOSIS — I10 Essential (primary) hypertension: Secondary | ICD-10-CM

## 2019-10-08 DIAGNOSIS — F0280 Dementia in other diseases classified elsewhere without behavioral disturbance: Secondary | ICD-10-CM | POA: Diagnosis present

## 2019-10-08 DIAGNOSIS — L89321 Pressure ulcer of left buttock, stage 1: Secondary | ICD-10-CM | POA: Diagnosis present

## 2019-10-08 DIAGNOSIS — R296 Repeated falls: Secondary | ICD-10-CM

## 2019-10-08 DIAGNOSIS — E876 Hypokalemia: Secondary | ICD-10-CM | POA: Diagnosis present

## 2019-10-08 DIAGNOSIS — K219 Gastro-esophageal reflux disease without esophagitis: Secondary | ICD-10-CM | POA: Diagnosis present

## 2019-10-08 DIAGNOSIS — R531 Weakness: Secondary | ICD-10-CM | POA: Diagnosis present

## 2019-10-08 DIAGNOSIS — Z8249 Family history of ischemic heart disease and other diseases of the circulatory system: Secondary | ICD-10-CM | POA: Diagnosis not present

## 2019-10-08 DIAGNOSIS — M1990 Unspecified osteoarthritis, unspecified site: Secondary | ICD-10-CM | POA: Diagnosis present

## 2019-10-08 DIAGNOSIS — R410 Disorientation, unspecified: Secondary | ICD-10-CM | POA: Diagnosis not present

## 2019-10-08 DIAGNOSIS — Z748 Other problems related to care provider dependency: Secondary | ICD-10-CM

## 2019-10-08 DIAGNOSIS — G9341 Metabolic encephalopathy: Secondary | ICD-10-CM | POA: Diagnosis present

## 2019-10-08 DIAGNOSIS — Z888 Allergy status to other drugs, medicaments and biological substances status: Secondary | ICD-10-CM | POA: Diagnosis not present

## 2019-10-08 DIAGNOSIS — M47816 Spondylosis without myelopathy or radiculopathy, lumbar region: Secondary | ICD-10-CM | POA: Diagnosis present

## 2019-10-08 DIAGNOSIS — M810 Age-related osteoporosis without current pathological fracture: Secondary | ICD-10-CM | POA: Diagnosis present

## 2019-10-08 DIAGNOSIS — N400 Enlarged prostate without lower urinary tract symptoms: Secondary | ICD-10-CM | POA: Diagnosis present

## 2019-10-08 DIAGNOSIS — E785 Hyperlipidemia, unspecified: Secondary | ICD-10-CM | POA: Diagnosis present

## 2019-10-08 DIAGNOSIS — L89301 Pressure ulcer of unspecified buttock, stage 1: Secondary | ICD-10-CM

## 2019-10-08 DIAGNOSIS — Z79890 Hormone replacement therapy (postmenopausal): Secondary | ICD-10-CM | POA: Diagnosis not present

## 2019-10-08 DIAGNOSIS — U071 COVID-19: Secondary | ICD-10-CM

## 2019-10-08 DIAGNOSIS — Z789 Other specified health status: Secondary | ICD-10-CM

## 2019-10-08 DIAGNOSIS — G3183 Dementia with Lewy bodies: Secondary | ICD-10-CM

## 2019-10-08 DIAGNOSIS — Z87891 Personal history of nicotine dependence: Secondary | ICD-10-CM | POA: Diagnosis not present

## 2019-10-08 DIAGNOSIS — Z79899 Other long term (current) drug therapy: Secondary | ICD-10-CM | POA: Diagnosis not present

## 2019-10-08 DIAGNOSIS — E782 Mixed hyperlipidemia: Secondary | ICD-10-CM | POA: Diagnosis not present

## 2019-10-08 DIAGNOSIS — M5136 Other intervertebral disc degeneration, lumbar region: Secondary | ICD-10-CM | POA: Diagnosis present

## 2019-10-08 LAB — CBC
HCT: 36.1 % — ABNORMAL LOW (ref 39.0–52.0)
Hemoglobin: 12.5 g/dL — ABNORMAL LOW (ref 13.0–17.0)
MCH: 29.8 pg (ref 26.0–34.0)
MCHC: 34.6 g/dL (ref 30.0–36.0)
MCV: 86.2 fL (ref 80.0–100.0)
Platelets: 144 10*3/uL — ABNORMAL LOW (ref 150–400)
RBC: 4.19 MIL/uL — ABNORMAL LOW (ref 4.22–5.81)
RDW: 15.5 % (ref 11.5–15.5)
WBC: 4.5 10*3/uL (ref 4.0–10.5)
nRBC: 0 % (ref 0.0–0.2)

## 2019-10-08 LAB — TYPE AND SCREEN
ABO/RH(D): O POS
Antibody Screen: NEGATIVE

## 2019-10-08 LAB — FERRITIN: Ferritin: 105 ng/mL (ref 24–336)

## 2019-10-08 LAB — CREATININE, SERUM
Creatinine, Ser: 1.02 mg/dL (ref 0.61–1.24)
GFR calc Af Amer: >60 mL/min (ref >60)
GFR calc non Af Amer: >60 mL/min (ref >60)

## 2019-10-08 LAB — FIBRIN DERIVATIVES D-DIMER (ARMC ONLY): Fibrin derivatives D-dimer (ARMC): 955.1 ng/mL (FEU) — ABNORMAL HIGH (ref 0.00–499.00)

## 2019-10-08 LAB — HEPATITIS B SURFACE ANTIGEN: Hepatitis B Surface Ag: NONREACTIVE

## 2019-10-08 LAB — TROPONIN I (HIGH SENSITIVITY): Troponin I (High Sensitivity): 13 ng/L (ref <18)

## 2019-10-08 LAB — LACTATE DEHYDROGENASE: LDH: 194 U/L — ABNORMAL HIGH (ref 98–192)

## 2019-10-08 LAB — FIBRINOGEN: Fibrinogen: 442 mg/dL (ref 210–475)

## 2019-10-08 LAB — BRAIN NATRIURETIC PEPTIDE: B Natriuretic Peptide: 27.8 pg/mL (ref 0.0–100.0)

## 2019-10-08 LAB — PROCALCITONIN: Procalcitonin: <0.1 ng/mL

## 2019-10-08 MED ORDER — SODIUM CHLORIDE 0.9 % IV SOLN
100.0000 mg | Freq: Every day | INTRAVENOUS | Status: AC
  Administered 2019-10-09 – 2019-10-12 (×4): 100 mg via INTRAVENOUS
  Filled 2019-10-08 (×4): qty 20

## 2019-10-08 MED ORDER — FUROSEMIDE 20 MG PO TABS
20.0000 mg | ORAL_TABLET | ORAL | Status: DC
  Administered 2019-10-08 – 2019-10-12 (×3): 20 mg via ORAL
  Filled 2019-10-08 (×5): qty 1

## 2019-10-08 MED ORDER — CHLORHEXIDINE GLUCONATE CLOTH 2 % EX PADS
6.0000 | MEDICATED_PAD | Freq: Every day | CUTANEOUS | Status: DC
  Administered 2019-10-08 – 2019-10-11 (×4): 6 via TOPICAL

## 2019-10-08 MED ORDER — VITAMIN D 25 MCG (1000 UNIT) PO TABS
1000.0000 [IU] | ORAL_TABLET | Freq: Every day | ORAL | Status: DC
  Administered 2019-10-08 – 2019-10-12 (×5): 1000 [IU] via ORAL
  Filled 2019-10-08 (×5): qty 1

## 2019-10-08 MED ORDER — PREDNISONE 20 MG PO TABS: 50.0000 mg | ORAL_TABLET | Freq: Every day | ORAL | Status: DC

## 2019-10-08 MED ORDER — ACETAMINOPHEN 325 MG PO TABS
650.0000 mg | ORAL_TABLET | Freq: Four times a day (QID) | ORAL | Status: DC | PRN
  Administered 2019-10-11 (×2): 650 mg via ORAL
  Filled 2019-10-08 (×2): qty 2

## 2019-10-08 MED ORDER — SODIUM CHLORIDE 0.9 % IV SOLN
1.0000 g | INTRAVENOUS | Status: DC
  Administered 2019-10-08: 18:00:00 1 g via INTRAVENOUS
  Filled 2019-10-08: qty 1
  Filled 2019-10-08: qty 10

## 2019-10-08 MED ORDER — DICLOFENAC SODIUM 1 % EX GEL
2.0000 g | Freq: Four times a day (QID) | CUTANEOUS | Status: DC | PRN
  Filled 2019-10-08: qty 100

## 2019-10-08 MED ORDER — ENOXAPARIN SODIUM 40 MG/0.4ML ~~LOC~~ SOLN
40.0000 mg | SUBCUTANEOUS | Status: DC
  Administered 2019-10-08 – 2019-10-11 (×4): 40 mg via SUBCUTANEOUS
  Filled 2019-10-08 (×4): qty 0.4

## 2019-10-08 MED ORDER — METHYLPREDNISOLONE SODIUM SUCC 125 MG IJ SOLR
1.0000 mg/kg | Freq: Two times a day (BID) | INTRAMUSCULAR | Status: DC
  Filled 2019-10-08: qty 2

## 2019-10-08 MED ORDER — ASCORBIC ACID 500 MG PO TABS: 500.0000 mg | ORAL_TABLET | Freq: Every day | ORAL | Status: DC

## 2019-10-08 MED ORDER — CARBIDOPA-LEVODOPA 25-100 MG PO TABS
1.0000 | ORAL_TABLET | Freq: Three times a day (TID) | ORAL | Status: DC
  Administered 2019-10-08 – 2019-10-12 (×17): 1 via ORAL
  Filled 2019-10-08 (×21): qty 1

## 2019-10-08 MED ORDER — ONDANSETRON HCL 4 MG/2ML IJ SOLN: 4.0000 mg | Freq: Four times a day (QID) | INTRAMUSCULAR | Status: DC | PRN

## 2019-10-08 MED ORDER — ZINC SULFATE 220 (50 ZN) MG PO CAPS
220.0000 mg | ORAL_CAPSULE | Freq: Every day | ORAL | Status: DC
  Administered 2019-10-08 – 2019-10-12 (×5): 220 mg via ORAL
  Filled 2019-10-08 (×5): qty 1

## 2019-10-08 MED ORDER — MELATONIN 5 MG PO TABS
5.0000 mg | ORAL_TABLET | Freq: Every day | ORAL | Status: DC
  Administered 2019-10-08 – 2019-10-11 (×4): 5 mg via ORAL
  Filled 2019-10-08 (×5): qty 1

## 2019-10-08 MED ORDER — FLUTICASONE PROPIONATE 50 MCG/ACT NA SUSP
2.0000 | Freq: Every day | NASAL | Status: DC | PRN
  Filled 2019-10-08: qty 16

## 2019-10-08 MED ORDER — ZOLPIDEM TARTRATE 5 MG PO TABS: 5.0000 mg | ORAL_TABLET | Freq: Every evening | ORAL | Status: DC | PRN

## 2019-10-08 MED ORDER — SODIUM CHLORIDE 0.9 % IV SOLN
200.0000 mg | Freq: Once | INTRAVENOUS | Status: AC
  Administered 2019-10-08: 12:00:00 200 mg via INTRAVENOUS
  Filled 2019-10-08: qty 200

## 2019-10-08 MED ORDER — PANTOPRAZOLE SODIUM 40 MG PO TBEC
40.0000 mg | DELAYED_RELEASE_TABLET | Freq: Every day | ORAL | Status: DC
  Administered 2019-10-08 – 2019-10-12 (×5): 40 mg via ORAL
  Filled 2019-10-08 (×5): qty 1

## 2019-10-08 MED ORDER — AMLODIPINE BESYLATE 5 MG PO TABS
5.0000 mg | ORAL_TABLET | Freq: Every day | ORAL | Status: DC
  Administered 2019-10-08 – 2019-10-12 (×5): 5 mg via ORAL
  Filled 2019-10-08 (×4): qty 1

## 2019-10-08 MED ORDER — ADULT MULTIVITAMIN W/MINERALS CH
1.0000 | ORAL_TABLET | Freq: Every day | ORAL | Status: DC
  Administered 2019-10-08 – 2019-10-12 (×5): 1 via ORAL
  Filled 2019-10-08 (×5): qty 1

## 2019-10-08 MED ORDER — ASCORBIC ACID 500 MG PO TABS
500.0000 mg | ORAL_TABLET | Freq: Every day | ORAL | Status: DC
  Administered 2019-10-08 – 2019-10-12 (×5): 500 mg via ORAL
  Filled 2019-10-08 (×5): qty 1

## 2019-10-08 MED ORDER — TRAZODONE HCL 50 MG PO TABS
50.0000 mg | ORAL_TABLET | Freq: Every day | ORAL | Status: DC
  Administered 2019-10-08 – 2019-10-11 (×4): 50 mg via ORAL
  Filled 2019-10-08 (×4): qty 1

## 2019-10-08 MED ORDER — SODIUM CHLORIDE 0.9 % IV SOLN
200.0000 mg | Freq: Once | INTRAVENOUS | Status: DC
  Filled 2019-10-08: qty 40

## 2019-10-08 MED ORDER — ONDANSETRON HCL 4 MG PO TABS: 4.0000 mg | ORAL_TABLET | Freq: Four times a day (QID) | ORAL | Status: DC | PRN

## 2019-10-08 MED ORDER — SODIUM CHLORIDE 0.9 % IV SOLN: 100.0000 mg | Freq: Every day | INTRAVENOUS | Status: DC

## 2019-10-08 NOTE — Progress Notes (Signed)
Pt presented with AMS and also has history of dementia. Unable to complete admission questions at this time, as pt is only oriented to self/time. Will see if patient's daughter can be contacted today to get information since she is patients caretaker.

## 2019-10-08 NOTE — H&P (Signed)
History and Physical    Randy [name] ZOX:[id number] DOB: 01/25/1934 DOA: 10/08/2019  PCP: [name], DO   Patient coming from: Home  I have personally briefly reviewed patient's old medical records in [place]  Chief Complaint: Weakness, Covid positive  HPI: Randy [name] is a 85 y.o. male with medical history significant for BPH, DJD and DJD with history of recurrent falls, GERD, unvaccinated, diagnosed with COVID-19 on 9/27 received monoclonal antibodies on 9/29, brought in because of generalized weakness requiring personnel assistance for ambulation with family's inability to care for patient.  Patient has a dry cough but no shortness of breath and no fever or chills and denies chest pain.  He has no vomiting or diarrhea.  Primary caregiver, patient's daughter currently has COVID-19 time due to illness unable to provide the care at home. ED Course: On arrival in the emergency room, he is afebrile, O2 sat 93% on room air with otherwise normal vitals.  Blood work unremarkable.  COVID-19 inflammatory biomarkers still pending.  Chest x-ray shows no acute disease.  EKG as reviewed by me : Normal sinus rhythm with no acute ST-T wave changes Hospitalist consulted for admission  Review of Systems: As per HPI otherwise all other systems on review of systems negative.    Past Medical History:  Diagnosis Date  . Arthritis   . GERD (gastroesophageal reflux disease)   . Hyperlipidemia   . Hypertension   . Lewy body dementia ([place])   . Osteoporosis   . Prostate enlargement     Past Surgical History:  Procedure Laterality Date  . CHOLECYSTECTOMY N/A 09/10/2018   Procedure: LAPAROSCOPIC CHOLECYSTECTOMY;  Surgeon: Benjamine Sprague, DO;  Location: [facility] ORS;  Service: General;  Laterality: N/A;  . GANGLION CYST EXCISION Left 2013   Left upper arm  . HERNIA REPAIR N/A 2008   [place], ventral acquired hernia repair  . HOLEP-LASER ENUCLEATION OF THE PROSTATE WITH MORCELLATION N/A  11/06/2018   Procedure: HOLEP-LASER ENUCLEATION OF THE PROSTATE WITH MORCELLATION;  Surgeon: [name], MD;  Location: [facility] ORS;  Service: Urology;  Laterality: N/A;     reports that he quit smoking about 53 years ago. His smoking use included cigarettes. He has a 12.00 pack-year smoking history. He has never used smokeless tobacco. He reports that he does not drink alcohol and does not use drugs.  Allergies  Allergen Reactions  . Antihistamines, Chlorpheniramine-Type Other (See Comments)    Can't urinate    Family History  Problem Relation Age of Onset  . Breast cancer Mother   . Lung cancer Father   . Heart disease Brother   . Leukemia Brother   . Prostate cancer Neg Hx   . Colon cancer Neg Hx   . Bladder Cancer Neg Hx   . Kidney cancer Neg Hx       Prior to Admission medications   Medication Sig Start Date End Date Taking? Authorizing Provider  carbidopa-levodopa (SINEMET IR) 25-100 MG tablet Take 1 tablet by mouth 4 (four) times daily.  07/29/18   [provider]  cholecalciferol (VITAMIN D) 1000 units tablet Take 1,000 Units by mouth daily.    [provider]  Coenzyme Q10 (CO Q 10) 100 MG CAPS Take 1 capsule by mouth daily.     [provider]  diclofenac Sodium (VOLTAREN) 1 % GEL Apply 2 g topically 4 (four) times daily as needed. To knee arthritis pain 07/19/19   [name], DO  enalapril (  VASOTEC) 20 MG tablet TAKE 1 TABLET BY MOUTH TWICE A DAY 09/27/19   Karamalegos, Alexander J, DO  fluticasone (FLONASE) 50 MCG/ACT nasal spray Place 2 sprays into both nostrils daily as needed for allergies.     [provider]  furosemide (LASIX) 20 MG tablet Take 1 tablet (20 mg total) by mouth every other day. 06/17/19   Karamalegos, Devonne Doughty, DO  levofloxacin (LEVAQUIN) 750 MG tablet Take 1 tablet (750 mg total) by mouth daily. 10/04/19   Nance Pear, MD  MELATONIN PO Take 1 tablet by mouth at bedtime.    [provider]  Omega-3 Fatty Acids (FISH OIL) 1200 MG CAPS Take 1,200 mg by mouth 2 (two) times daily.     [provider]  omeprazole (PRILOSEC) 20 MG capsule Take 20 mg by mouth daily.    [provider]  predniSONE (DELTASONE) 20 MG tablet Take daily with food. Start with 60mg  (3 pills) x 2 days, then reduce to 40mg  (2 pills) x 2 days, then 20mg  (1 pill) x 3 days 09/21/19   [name], DO  vitamin C (ASCORBIC ACID) 500 MG tablet Take 500 mg by mouth daily.    [provider]    Physical Exam: Vitals:   10/07/19 1802 10/07/19 1805  BP: 114/75   Pulse: 84   Resp: 20   Temp: 98 F (36.7 C)   TempSrc: Oral   SpO2: 93%   Weight:  99.8 kg  Height:  6' (1.829 m)     Vitals:   10/07/19 1802 10/07/19 1805  BP: 114/75   Pulse: 84   Resp: 20   Temp: 98 F (36.7 C)   TempSrc: Oral   SpO2: 93%   Weight:  99.8 kg  Height:  6' (1.829 m)      Constitutional: Alert and oriented x 3 . Not in any apparent distress HEENT:      Head: Normocephalic and atraumatic.         Eyes: PERLA, EOMI, Conjunctivae are normal. Sclera is non-icteric.       Mouth/Throat: Mucous membranes are moist.       Neck: Supple with no signs of meningismus. Cardiovascular: Regular rate and rhythm. No murmurs, gallops, or rubs. 2+ symmetrical distal pulses are present . No JVD. No LE edema Respiratory: Respiratory effort normal .Lungs sounds clear bilaterally. No wheezes, crackles, or rhonchi.  Gastrointestinal: Soft, non tender, and non distended with positive bowel sounds. No rebound or guarding. Genitourinary: No CVA tenderness. Musculoskeletal: Nontender with normal range of motion in all extremities. No cyanosis, or erythema of extremities. Neurologic:  Face is symmetric. Moving all extremities. No gross focal neurologic deficits . Skin: Skin is warm, dry.  No rash or ulcers Psychiatric: Mood and affect are normal    Labs on Admission: I have personally reviewed  following labs and imaging studies  CBC: Recent Labs  Lab 10/04/19 1253 10/07/19 1817  WBC 5.6 5.1  HGB 13.0 13.0  HCT 39.3 36.5*  MCV 91.6 85.7  PLT 177 662   Basic Metabolic Panel: Recent Labs  Lab 10/04/19 1253 10/07/19 1817  NA 138 136  K 3.5 3.6  CL 101 100  CO2 28 23  GLUCOSE 101* 105*  BUN 21 22  CREATININE 0.97 0.90  CALCIUM 8.8* 8.3*   GFR: Estimated Creatinine Clearance: 76.1 mL/min (by C-G formula based on SCr of 0.9 mg/dL). Liver Function Tests: No results for input(s): AST, ALT, ALKPHOS, BILITOT, PROT, ALBUMIN in  the last 168 hours. No results for input(s): LIPASE, AMYLASE in the last 168 hours. No results for input(s): AMMONIA in the last 168 hours. Coagulation Profile: No results for input(s): INR, PROTIME in the last 168 hours. Cardiac Enzymes: No results for input(s): CKTOTAL, CKMB, CKMBINDEX, TROPONINI in the last 168 hours. BNP (last 3 results) No results for input(s): PROBNP in the last 8760 hours. HbA1C: No results for input(s): HGBA1C in the last 72 hours. CBG: No results for input(s): GLUCAP in the last 168 hours. Lipid Profile: No results for input(s): CHOL, HDL, LDLCALC, TRIG, CHOLHDL, LDLDIRECT in the last 72 hours. Thyroid Function Tests: No results for input(s): TSH, T4TOTAL, FREET4, T3FREE, THYROIDAB in the last 72 hours. Anemia Panel: No results for input(s): VITAMINB12, FOLATE, FERRITIN, TIBC, IRON, RETICCTPCT in the last 72 hours. Urine analysis:    Component Value Date/Time   COLORURINE YELLOW (A) 10/04/2019 1932   APPEARANCEUR CLOUDY (A) 10/04/2019 1932   APPEARANCEUR Hazy (A) 10/27/2018 1609   LABSPEC 1.018 10/04/2019 1932   PHURINE 5.0 10/04/2019 1932   GLUCOSEU NEGATIVE 10/04/2019 1932   HGBUR NEGATIVE 10/04/2019 1932   BILIRUBINUR NEGATIVE 10/04/2019 1932   BILIRUBINUR Negative 10/27/2018 1609   BILIRUBINUR negative 04/17/2015 0826   KETONESUR NEGATIVE 10/04/2019 1932   PROTEINUR 30 (A) 10/04/2019 1932    UROBILINOGEN 0.2 05/30/2016 1325   UROBILINOGEN Normal 04/17/2015 0826   NITRITE POSITIVE (A) 10/04/2019 1932   LEUKOCYTESUR LARGE (A) 10/04/2019 1932   LEUKOCYTESUR negative 04/17/2015 0826    Radiological Exams on Admission: DG Chest 2 View  Result Date: 10/07/2019 CLINICAL DATA:  Weakness, COVID EXAM: CHEST - 2 VIEW COMPARISON:  10/04/2019 FINDINGS: No focal opacity or pleural effusion. Mild cardiomegaly with aortic atherosclerosis. No pneumothorax. Mild vertebral wedging at the midthoracic spine. IMPRESSION: No active cardiopulmonary disease. Mild cardiomegaly. Electronically Signed   By: Donavan Foil M.D.   On: 10/07/2019 23:58     Assessment/Plan 85 year old male with history of BPH, DDD and DJD with history of recurrent falls, GERD, unvaccinated against Covid, diagnosed with COVID-19 on 9/27 received monoclonal antibodies on 9/29, brought in because of generalized weakness.     COVID-19 virus infection  Generalized weakness -Patient with intractable weakness requiring 2 person assist for ambulation, likely all related to COVID-19 infection -Patient is unvaccinated -Chest x-ray with no pneumonia.  Patient is not hypoxic -As needed antitussives.  Vitamins -Patient received monoclonal antibodies on 9/29 -At this time, not a candidate for remdesivir or steroids but will continue to monitor closely for development of worsening of current symptoms    Unable to care for self   Caregiver unable to cope  Degenerative joint disease (DJD) of lumbar spine   Recurrent falls -Transition of care consult given caregiver unable to provide care as she is also ill from Covid -PT OT consult    GERD (gastroesophageal reflux disease) -Continue home omeprazole    BPH without obstruction/lower urinary tract symptoms -Continue prostate meds pending med rec    DVT prophylaxis: Lovenox  Code Status: full code  Family Communication:  none  Disposition Plan: Back to previous home  environment Consults called: none  Status: Inpatient due to no safe discharge option    Athena Masse MD [facility]     10/08/2019, 2:16 AM

## 2019-10-08 NOTE — ED Notes (Signed)
Pt eating meal tray 

## 2019-10-08 NOTE — Progress Notes (Signed)
Patient ID: Randy [name], male   DOB: 1934/08/13, 85 y.o.   MRN: [id number] [facility] PROGRESS NOTE  JEREMY DITULLIO OAC:[id number] DOB: 05-Sep-1934 DOA: 10/08/2019 PCP: [name], DO  HPI/Subjective: Patient did not sleep much last night.  Came in with weakness.  Did have monoclonal antibody infusion for being Covid positive.  Family also has Covid and having difficulty taking care of him at home.  Objective: Vitals:   10/08/19 1229 10/08/19 1342  BP: (!) 174/71 (!) 162/101  Pulse:    Resp:    Temp:    SpO2:      Filed Weights   10/07/19 1805  Weight: 99.8 kg    ROS: Review of Systems  Respiratory: Negative for shortness of breath.   Cardiovascular: Negative for chest pain.  Gastrointestinal: Negative for abdominal pain, nausea and vomiting.   Exam: Physical Exam HENT:     Head: Normocephalic.     Mouth/Throat:     Pharynx: No oropharyngeal exudate.  Eyes:     General: Lids are normal.     Conjunctiva/sclera: Conjunctivae normal.  Cardiovascular:     Rate and Rhythm: Normal rate and regular rhythm.     Heart sounds: Normal heart sounds, S1 normal and S2 normal.  Pulmonary:     Breath sounds: Examination of the right-lower field reveals decreased breath sounds. Examination of the left-lower field reveals decreased breath sounds. Decreased breath sounds present. No wheezing, rhonchi or rales.  Abdominal:     Palpations: Abdomen is soft.     Tenderness: There is no abdominal tenderness.  Musculoskeletal:     Right ankle: No swelling.     Left ankle: No swelling.  Skin:    General: Skin is warm.     Findings: No rash.  Neurological:     Mental Status: He is alert.     Comments: Follows commands.       Data Reviewed: Basic Metabolic Panel: Recent Labs  Lab 10/04/19 1253 10/07/19 1817 10/08/19 0154  NA 138 136  --   K 3.5 3.6  --   CL 101 100  --   CO2 28 23  --   GLUCOSE 101* 105*  --   BUN 21 22  --   CREATININE 0.97 0.90 1.02   CALCIUM 8.8* 8.3*  --    CBC: Recent Labs  Lab 10/04/19 1253 10/07/19 1817 10/08/19 0154  WBC 5.6 5.1 4.5  HGB 13.0 13.0 12.5*  HCT 39.3 36.5* 36.1*  MCV 91.6 85.7 86.2  PLT 177 151 144*   BNP (last 3 results) Recent Labs    10/08/19 0154  BNP 27.8      Recent Results (from the past 240 hour(s))  Urine Culture     Status: Abnormal   Collection Time: 10/04/19  7:32 PM   Specimen: Urine, Random  Result Value Ref Range Status   Specimen Description   Final    URINE, RANDOM Performed at [facility], [address], [place], [place] [address]    Special Requests   Final    NONE Performed at [facility], [address] [place],  [address]    Culture MULTIPLE SPECIES PRESENT, SUGGEST RECOLLECTION (A)  Final   Report Status 10/05/2019 FINAL  Final  Respiratory Panel by RT PCR (Flu A&B, Covid) - Nasopharyngeal Swab     Status: Abnormal   Collection Time: 10/04/19  9:02 PM   Specimen: Nasopharyngeal Swab  Result Value Ref Range Status  SARS Coronavirus 2 by RT PCR POSITIVE (A) NEGATIVE Final    Comment: RESULT CALLED TO, READ BACK BY AND VERIFIED WITH: NIESHA WITTED @ 2202 10/04/2019 RH    Influenza A by PCR NEGATIVE NEGATIVE Final   Influenza B by PCR NEGATIVE NEGATIVE Final    Comment: (NOTE) The Xpert Xpress SARS-CoV-2/FLU/RSV assay is intended as an aid in  the diagnosis of influenza from Nasopharyngeal swab specimens and  should not be used as a sole basis for treatment. Nasal washings and  aspirates are unacceptable for Xpert Xpress SARS-CoV-2/FLU/RSV  testing.  Fact Sheet for Patients: PinkCheek.be  Fact Sheet for Healthcare Providers: GravelBags.it  This test is not yet approved or cleared by the [place] FDA and  has been authorized for detection and/or diagnosis of SARS-CoV-2 by  FDA under an Emergency Use Authorization (EUA). This EUA will remain  in effect  (meaning this test can be used) for the duration of the  Covid-19 declaration under Section 564(b)(1) of the Act, 21  U.S.C. section 360bbb-3(b)(1), unless the authorization is  terminated or revoked. Performed at [facility], [address] [place], [place] [address]      Studies: DG Chest 2 View  Result Date: 10/07/2019 CLINICAL DATA:  Weakness, COVID EXAM: CHEST - 2 VIEW COMPARISON:  10/04/2019 FINDINGS: No focal opacity or pleural effusion. Mild cardiomegaly with aortic atherosclerosis. No pneumothorax. Mild vertebral wedging at the midthoracic spine. IMPRESSION: No active cardiopulmonary disease. Mild cardiomegaly. Electronically Signed   By: Donavan Foil M.D.   On: 10/07/2019 23:58    Scheduled Meds: . amLODipine  5 mg Oral Daily  . vitamin C  500 mg Oral Daily  . carbidopa-levodopa  1 tablet Oral TID WC & HS  . cholecalciferol  1,000 Units Oral Daily  . enoxaparin (LOVENOX) injection  40 mg Subcutaneous Q24H  . furosemide  20 mg Oral QODAY  . melatonin  5 mg Oral QHS  . multivitamin with minerals  1 tablet Oral Daily  . pantoprazole  40 mg Oral Daily  . traZODone  50 mg Oral QHS  . zinc sulfate  220 mg Oral Daily   Continuous Infusions: . cefTRIAXone (ROCEPHIN)  IV    . [START ON 10/09/2019] remdesivir 100 mg in NS 100 mL      Assessment/Plan:  1. COVID-19 infection.  Patient received monoclonal antibody.  Did not receive vaccination previously.  Started remdesivir today. 2. Weakness.  Physical therapy recommended rehab.  We will get transitional care team to look into this. 3. Lewy body dementia.  Patient switched his days and nights.  Will start trazodone at night.  Continue melatonin.  Discontinue steroids which can worsen mental status. 4. GERD on PPI 5. Essential hypertension start Norvasc 6. Possible UTI.  Send off urine analysis urine culture give a dose of Rocephin 7. Pressure injury deep tissue with skin blister.  Present on admission.  See  description below.  Pressure Injury 10/08/19 Buttocks Left;Upper Deep Tissue Pressure Injury - Purple or maroon localized area of discolored intact skin or blood-filled blister due to damage of underlying soft tissue from pressure and/or shear. (Active)  10/08/19 0325  Location: Buttocks  Location Orientation: Left;Upper  Staging: Deep Tissue Pressure Injury - Purple or maroon localized area of discolored intact skin or blood-filled blister due to damage of underlying soft tissue from pressure and/or shear.  Wound Description (Comments):   Present on Admission: Yes   Code Status:     Code Status Orders  (  From admission, onward)         Start     Ordered   10/08/19 0215  Full code  Continuous        10/08/19 0216        Code Status History    Date Active Date Inactive Code Status Order ID Comments User Context   11/06/2018 1728 11/07/2018 1522 Full Code [id number]  [name], MD Inpatient   09/09/2018 0417 09/11/2018 2037 Full Code [id number]  Vaughan Basta, MD ED   Advance Care Planning Activity     Family Communication: Spoke with daughter on the phone Disposition Plan: Status is: Inpatient  Dispo: The patient is from: Home              Anticipated d/c is to: Rehab              Anticipated d/c date is: 10/12/2019              Patient currently being treated for COVID-19 infection  Time spent: 28 minutes  [employer]

## 2019-10-08 NOTE — ED Notes (Signed)
Pt in with co generalized weakness, recently diagnosed with Covid along with daughter who is care taker. Pt unable to ambulate at home and daughter unable to care for him due to her condition. Pt has no complaints at this time, no co pain or shob. No distress noted. Monitor in place, sats 95% ra.

## 2019-10-08 NOTE — ED Provider Notes (Signed)
[facility] Emergency Department Provider Note   ____________________________________________   First MD Initiated Contact with Patient 10/08/19 0139     (approximate)  I have reviewed the triage vital signs and the nursing notes.   HISTORY  Chief Complaint Fatigue and covid +    HPI Randy [name] is a 85 y.o. male brought to the ED via EMS from home with a chief complaint of generalized weakness.  Patient is unvaccinated against COVID-19 and tested positive on 10/04/2019.  Received antibody infusion on 10/06/2019.  Daughter told EMS patient is also currently being treated for UTI.  Normally ambulatory with cane but developed generalized weakness, unable to ambulate.  Daughter also with COVID-19 and too weak to take care of patient.  Endorses dry cough.  Denies fever, chest pain, shortness of breath, abdominal pain, vomiting or diarrhea.     Past Medical History:  Diagnosis Date  . Arthritis   . GERD (gastroesophageal reflux disease)   . Hyperlipidemia   . Hypertension   . Lewy body dementia ([place])   . Osteoporosis   . Prostate enlargement     Patient Active Problem List   Diagnosis Date Noted  . COVID-19 virus infection 10/08/2019  . Prostate cancer ([place]) 06/30/2019  . BPH with urinary obstruction 11/06/2018  . Acute cholecystitis 09/08/2018  . Myalgia due to statin 02/24/2018  . Tricompartment osteoarthritis of left knee 11/14/2017  . Generalized weakness 04/28/2017  . Chronic low back pain 03/12/2017  . Knee pain, chronic 10/01/2016  . Chronic venous insufficiency 08/30/2016  . BPH without obstruction/lower urinary tract symptoms 03/06/2016  . Osteoarthritis of multiple joints 03/06/2016  . Degenerative joint disease (DJD) of lumbar spine 03/06/2016  . Hypertension 03/05/2016  . Hyperlipidemia 03/05/2016  . Osteoporosis 03/05/2016  . GERD (gastroesophageal reflux disease) 03/05/2016  . Bilateral lower extremity edema 03/05/2016    Past  Surgical History:  Procedure Laterality Date  . CHOLECYSTECTOMY N/A 09/10/2018   Procedure: LAPAROSCOPIC CHOLECYSTECTOMY;  Surgeon: Benjamine Sprague, DO;  Location: [facility] ORS;  Service: General;  Laterality: N/A;  . GANGLION CYST EXCISION Left 2013   Left upper arm  . HERNIA REPAIR N/A 2008   M[place]line, ventral acquired hernia repair  . HOLEP-LASER ENUCLEATION OF THE PROSTATE WITH MORCELLATION N/A 11/06/2018   Procedure: HOLEP-LASER ENUCLEATION OF THE PROSTATE WITH MORCELLATION;  Surgeon: [name], MD;  Location: [facility] ORS;  Service: Urology;  Laterality: N/A;    Prior to Admission medications   Medication Sig Start Date End Date Taking? Authorizing Provider  carbidopa-levodopa (SINEMET IR) 25-100 MG tablet Take 1 tablet by mouth 4 (four) times daily.  07/29/18   [provider]  cholecalciferol (VITAMIN D) 1000 units tablet Take 1,000 Units by mouth daily.    [provider]  Coenzyme Q10 (CO Q 10) 100 MG CAPS Take 1 capsule by mouth daily.     [provider]  diclofenac Sodium (VOLTAREN) 1 % GEL Apply 2 g topically 4 (four) times daily as needed. To knee arthritis pain 07/19/19   [name], DO  enalapril (VASOTEC) 20 MG tablet TAKE 1 TABLET BY MOUTH TWICE A DAY 09/27/19   Karamalegos, Alexander J, DO  fluticasone (FLONASE) 50 MCG/ACT nasal spray Place 2 sprays into both nostrils daily as needed for allergies.     [provider]  furosemide (LASIX) 20 MG tablet Take 1 tablet (20 mg total) by mouth every other day. 06/17/19   Karamalegos, Devonne Doughty, DO  levofloxacin [facility])  750 MG tablet Take 1 tablet (750 mg total) by mouth daily. 10/04/19   Nance Pear, MD  MELATONIN PO Take 1 tablet by mouth at bedtime.    [provider]  Omega-3 Fatty Acids (FISH OIL) 1200 MG CAPS Take 1,200 mg by mouth 2 (two) times daily.     [provider]  omeprazole (PRILOSEC) 20 MG capsule Take 20 mg by mouth daily.    [provider]  predniSONE (DELTASONE) 20 MG tablet Take daily with food. Start with 60mg  (3 pills) x 2 days, then reduce to 40mg  (2 pills) x 2 days, then 20mg  (1 pill) x 3 days 09/21/19   [name], DO  vitamin C (ASCORBIC ACID) 500 MG tablet Take 500 mg by mouth daily.    [provider]    Allergies Antihistamines, chlorpheniramine-type  Family History  Problem Relation Age of Onset  . Breast cancer Mother   . Lung cancer Father   . Heart disease Brother   . Leukemia Brother   . Prostate cancer Neg Hx   . Colon cancer Neg Hx   . Bladder Cancer Neg Hx   . Kidney cancer Neg Hx     Social History Social History   Tobacco Use  . Smoking status: Former Smoker    Packs/day: 1.00    Years: 12.00    Pack years: 12.00    Types: Cigarettes    Quit date: 1968    Years since quitting: 53.7  . Smokeless tobacco: Never Used  Vaping Use  . Vaping Use: Never used  Substance Use Topics  . Alcohol use: No  . Drug use: No    Review of Systems  Constitutional: Positive for generalized weakness and fatigue.  No fever/chills Eyes: No visual changes. ENT: No sore throat. Cardiovascular: Denies chest pain. Respiratory: Positive for cough.  Denies shortness of breath. Gastrointestinal: No abdominal pain.  No nausea, no vomiting.  No diarrhea.  No constipation. Genitourinary: Negative for dysuria. Musculoskeletal: Negative for back pain. Skin: Negative for rash. Neurological: Negative for headaches, focal weakness or numbness.   ____________________________________________   PHYSICAL EXAM:  VITAL SIGNS: ED Triage Vitals  Enc Vitals Group     BP 10/07/19 1802 114/75     Pulse Rate 10/07/19 1802 84     Resp 10/07/19 1802 20     Temp 10/07/19 1802 98 F (36.7 C)     Temp Source 10/07/19 1802 Oral     SpO2 10/07/19 1802 93 %     Weight 10/07/19 1805 220 lb (99.8 kg)     Height 10/07/19 1805 6' (1.829 m)     Head Circumference --      Peak Flow --      Pain  Score 10/07/19 1804 0     Pain Loc --      Pain Edu? --      Excl. in [place]? --     Constitutional: Alert and oriented.  Elderly appearing and in mild acute distress. Eyes: Conjunctivae are normal. PERRL. EOMI. Head: Atraumatic. Nose: Congestion/rhinnorhea. Mouth/Throat: Mucous membranes are mildly dry.   Neck: No stridor.   Cardiovascular: Normal rate, regular rhythm. Grossly normal heart sounds.  Good peripheral circulation. Respiratory: Normal respiratory effort.  No retractions. Lungs diminished bibasilarly. Gastrointestinal: Soft and nontender. No distention. No abdominal bruits. No CVA tenderness. Musculoskeletal: No lower extremity tenderness nor edema.  No joint effusions. Neurologic:  Normal speech and language. No gross focal neurologic deficits are appreciated. Skin:  Skin is warm, dry and intact. No rash noted.  No petechiae. Psychiatric: Mood and affect are normal. Speech and behavior are normal.  ____________________________________________   LABS (all labs ordered are listed, but only abnormal results are displayed)  Labs Reviewed  BASIC METABOLIC PANEL - Abnormal; Notable for the following components:      Result Value   Glucose, Bld 105 (*)    Calcium 8.3 (*)    All other components within normal limits  CBC - Abnormal; Notable for the following components:   HCT 36.5 (*)    All other components within normal limits  URINALYSIS, COMPLETE (UACMP) WITH MICROSCOPIC  BRAIN NATRIURETIC PEPTIDE  C-REACTIVE PROTEIN  FIBRIN DERIVATIVES D-DIMER ([facility] ONLY)  FERRITIN  FIBRINOGEN  HEPATITIS B SURFACE ANTIGEN  LACTATE DEHYDROGENASE  PROCALCITONIN  ABO/RH  TYPE AND SCREEN  TROPONIN I (HIGH SENSITIVITY)  TROPONIN I (HIGH SENSITIVITY)   ____________________________________________  EKG  ED ECG REPORT I, Darrelle Wiberg J, the attending physician, personally viewed and interpreted this ECG.   Date: 10/08/2019  EKG Time: 1811  Rate: 82  Rhythm: normal EKG, normal  sinus rhythm  Axis: LAD  Intervals:none  ST&T Change: Nonspecific  ____________________________________________  RADIOLOGY  ED MD interpretation: Mild cardiomegaly  Official radiology report(s): DG Chest 2 View  Result Date: 10/07/2019 CLINICAL DATA:  Weakness, COVID EXAM: CHEST - 2 VIEW COMPARISON:  10/04/2019 FINDINGS: No focal opacity or pleural effusion. Mild cardiomegaly with aortic atherosclerosis. No pneumothorax. Mild vertebral wedging at the midthoracic spine. IMPRESSION: No active cardiopulmonary disease. Mild cardiomegaly. Electronically Signed   By: Donavan Foil M.D.   On: 10/07/2019 23:58    ____________________________________________   PROCEDURES  Procedure(s) performed (including Critical Care):  .1-3 Lead EKG Interpretation Performed by: Paulette Blanch, MD Authorized by: Paulette Blanch, MD     Interpretation: normal     ECG rate:  80   ECG rate assessment: normal     Rhythm: sinus rhythm     Ectopy: none     Conduction: normal   Comments:     Patient placed on cardiac monitor to evaluate for arrhythmias     ____________________________________________   INITIAL IMPRESSION / ASSESSMENT AND PLAN / ED COURSE  As part of my medical decision making, I reviewed the following data within the [place] notes reviewed and incorporated, Labs reviewed, EKG interpreted, Old chart reviewed, Radiograph reviewed, Discussed with admitting physician and Notes from prior ED visits        85 year old Covid positive male status post antibody infusion presenting with fatigue and generalized weakness.  Differential diagnosis includes but is not limited to viral illness, metabolic, infectious etiologies, ACS, etc.  Laboratory results unremarkable; 2 sets of troponins 15.  Will ambulate with pulse oximeter.   Clinical Course as of Oct 08 203  Fri Oct 08, 2019  0151 Ambulation trial unsuccessful.  Took 3 person assist to transfer patient from  wheelchair to stretcher and to the restroom.  At baseline patient ambulates independently with a cane.   [JS]    Clinical Course User Index [JS] Paulette Blanch, MD     ____________________________________________   FINAL CLINICAL IMPRESSION(S) / ED DIAGNOSES  Final diagnoses:  Generalized weakness  COVID-19     ED Discharge Orders    None      *Please note:  Randy [name] was evaluated in Emergency Department on 10/08/2019 for the symptoms described in the history of present illness. He was evaluated in the  context of the global COVID-19 pandemic, which necessitated consideration that the patient might be at risk for infection with the SARS-CoV-2 virus that causes COVID-19. Institutional protocols and algorithms that pertain to the evaluation of patients at risk for COVID-19 are in a state of rapid change based on information released by regulatory bodies including the CDC and federal and state organizations. These policies and algorithms were followed during the patient's care in the ED.  Some ED evaluations and interventions may be delayed as a result of limited staffing during and the pandemic.*   Note:  This document was prepared using Dragon voice recognition software and may include unintentional dictation errors.   Paulette Blanch, MD 10/08/19 [phone]

## 2019-10-08 NOTE — ED Notes (Signed)
Report called to [facility] RN,pt admitted to 1c.

## 2019-10-08 NOTE — ED Notes (Signed)
Pt taken to room 24 via w/c, pt with difficulty standing & requires assistance; pt assisted into hosp gown & on card monitor

## 2019-10-08 NOTE — Evaluation (Signed)
Physical Therapy Evaluation Patient Details Name: Randy [name] MRN: [id number] DOB: 1934-09-19 Today's Date: 10/08/2019   History of Present Illness  Randy [name] is a 85 y.o. male with medical history significant for BPH, DJD and DJD with history of recurrent falls, GERD, unvaccinated, diagnosed with COVID-19 on 9/27 received monoclonal antibodies on 9/29, brought in because of generalized weakness requiring personal assistance for ambulation with family's inability to care for patient.    Clinical Impression  Patient received in bed. Confused. Unable to tell me where he is. Has difficulty following direction for mobility due to confusion. He required mod assist for bed mobility with max verbal and tactile cues. He is able to stand with mod assist from elevated bed. Poor standing balance initially and he returned to sitting. Attempted to get him to scoot up in bed in seated position but he was unable to perform. Therefore, I assisted him in standing again with mod assist and he was able to take a few steps up along side of bed with mod assist. He required mod/max assist to return to supine and get positioned in bed. His strength seems good, however due to difficulty with command following he requires assistance with mobility. He will continue to benefit from skilled PT while here to improve functional mobility as able.         Follow Up Recommendations SNF    Equipment Recommendations  None recommended by PT;Other (comment) (TBD)    Recommendations for Other Services OT consult     Precautions / Restrictions Precautions Precautions: Fall Restrictions Weight Bearing Restrictions: No      Mobility  Bed Mobility Overal bed mobility: Needs Assistance Bed Mobility: Supine to Sit;Sit to Supine     Supine to sit: Mod assist Sit to supine: Mod assist   General bed mobility comments: patient with slow processing, not following commands consistently  Transfers Overall transfer level:  Needs assistance Equipment used: 1 person hand held assist Transfers: Sit to/from Stand Sit to Stand: Mod assist;From elevated surface         General transfer comment: patient requires mod assist to power up.  Ambulation/Gait Ambulation/Gait assistance: Mod assist Gait Distance (Feet): 3 Feet Assistive device: 1 person hand held assist Gait Pattern/deviations: Step-to pattern;Wide base of support Gait velocity: decreased   General Gait Details: able to take a few side steps along edge of bed, unsteady, fearful  Stairs            Wheelchair Mobility    Modified Rankin (Stroke Patients Only)       Balance Overall balance assessment: Needs assistance Sitting-balance support: Feet supported Sitting balance-Leahy Scale: Fair Sitting balance - Comments: due to confusion, patient benefits from supervision while seated   Standing balance support: Single extremity supported;During functional activity Standing balance-Leahy Scale: Poor Standing balance comment: poor standing balance, requires external assist to maintain balance                             Pertinent Vitals/Pain Pain Assessment: Faces Faces Pain Scale: Hurts a little bit Pain Location: reports leg cramps Pain Descriptors / Indicators: Cramping Pain Intervention(s): Monitored during session    Home Living                   Additional Comments: unsure- he reports he lives with wife, but he is confused. Unsure of accuracy of information.    Prior Function  Comments: states he used a cane     Hand Dominance        Extremity/Trunk Assessment   Upper Extremity Assessment Upper Extremity Assessment: Overall WFL for tasks assessed    Lower Extremity Assessment Lower Extremity Assessment: Overall WFL for tasks assessed    Cervical / Trunk Assessment Cervical / Trunk Assessment: Normal  Communication   Communication: No difficulties  Cognition  Arousal/Alertness: Awake/alert Behavior During Therapy: Restless Overall Cognitive Status: Impaired/Different from baseline Area of Impairment: Orientation;Awareness;Problem solving;Following commands;Safety/judgement;Memory                 Orientation Level: Disoriented to;Place;Time;Situation   Memory: Decreased short-term memory Following Commands: Follows one step commands with increased time;Follows one step commands inconsistently Safety/Judgement: Decreased awareness of safety;Decreased awareness of deficits Awareness: Intellectual Problem Solving: Slow processing;Decreased initiation;Difficulty sequencing;Requires verbal cues;Requires tactile cues        General Comments      Exercises     Assessment/Plan    PT Assessment Patient needs continued PT services  PT Problem List Decreased mobility;Decreased activity tolerance;Decreased balance;Decreased cognition;Decreased coordination;Decreased safety awareness       PT Treatment Interventions DME instruction;Therapeutic activities;Gait training;Cognitive remediation;Therapeutic exercise;Patient/family education;Balance training;Functional mobility training    PT Goals (Current goals can be found in the Care Plan section)  Acute Rehab PT Goals Patient Stated Goal: none stated PT Goal Formulation: Patient unable to participate in goal setting Time For Goal Achievement: 10/22/19    Frequency Min 2X/week   Barriers to discharge Decreased caregiver support      Co-evaluation               AM-PAC PT "6 Clicks" Mobility  Outcome Measure Help needed turning from your back to your side while in a flat bed without using bedrails?: A Lot Help needed moving from lying on your back to sitting on the side of a flat bed without using bedrails?: A Lot Help needed moving to and from a bed to a chair (including a wheelchair)?: A Lot Help needed standing up from a chair using your arms (e.g., wheelchair or bedside  chair)?: A Lot Help needed to walk in hospital room?: A Lot Help needed climbing 3-5 steps with a railing? : Total 6 Click Score: 11    End of Session Equipment Utilized During Treatment: Gait belt Activity Tolerance: Other (comment) (patient limited by confusion, ability to follow instruction) Patient left: in bed;with bed alarm set;with call bell/phone within reach Nurse Communication: Mobility status PT Visit Diagnosis: Unsteadiness on feet (R26.81);Other abnormalities of gait and mobility (R26.89);Difficulty in walking, not elsewhere classified (R26.2)    Time: 4287-6811 PT Time Calculation (min) (ACUTE ONLY): 25 min   Charges:   PT Evaluation $PT Eval Moderate Complexity: 1 Mod PT Treatments $Therapeutic Activity: 8-22 mins        Ezmae Speers, PT, GCS 10/08/19,12:04 PM

## 2019-10-09 DIAGNOSIS — R410 Disorientation, unspecified: Secondary | ICD-10-CM

## 2019-10-09 LAB — URINE CULTURE: Culture: NO GROWTH

## 2019-10-09 LAB — C-REACTIVE PROTEIN: CRP: 6.1 mg/dL — ABNORMAL HIGH (ref <1.0)

## 2019-10-09 MED ORDER — QUETIAPINE FUMARATE 25 MG PO TABS
25.0000 mg | ORAL_TABLET | Freq: Every evening | ORAL | Status: DC | PRN
  Administered 2019-10-09: 25 mg via ORAL
  Filled 2019-10-09: qty 1

## 2019-10-09 MED ORDER — ORAL CARE MOUTH RINSE
15.0000 mL | Freq: Two times a day (BID) | OROMUCOSAL | Status: DC
  Administered 2019-10-09 – 2019-10-12 (×6): 15 mL via OROMUCOSAL

## 2019-10-09 MED ORDER — SODIUM CHLORIDE 0.9 % IV SOLN
INTRAVENOUS | Status: DC | PRN
  Administered 2019-10-09 – 2019-10-12 (×4): 250 mL via INTRAVENOUS

## 2019-10-09 MED ORDER — RISPERIDONE 0.5 MG PO TABS
0.5000 mg | ORAL_TABLET | Freq: Two times a day (BID) | ORAL | Status: DC | PRN
  Filled 2019-10-09: qty 1

## 2019-10-09 MED ORDER — HALOPERIDOL LACTATE 5 MG/ML IJ SOLN
5.0000 mg | Freq: Once | INTRAMUSCULAR | Status: AC
  Administered 2019-10-09: 5 mg via INTRAVENOUS
  Filled 2019-10-09: qty 1

## 2019-10-09 NOTE — Progress Notes (Signed)
Patient ID: Randy [name], male   DOB: [birth year], 85 y.o.   MRN: [id number] [facility] PROGRESS NOTE  Randy [name] AST:[id number] DOB: 09-23-1934 DOA: 10/08/2019 PCP: Randy Hauser, DO  HPI/Subjective: Patient needed to be medicated last night with Haldol.  Came in with flipping his days and nights and altered mental status.  Patient said he slept a little bit last night.  Objective: Vitals:   10/09/19 0738 10/09/19 1116  BP: (!) 153/90 (!) 171/94  Pulse: 67 70  Resp: 19 19  Temp: 97.7 F (36.5 C) 97.8 F (36.6 C)  SpO2: (!) 87% 99%    Intake/Output Summary (Last 24 hours) at 10/09/2019 1218 Last data filed at 10/09/2019 0300 Gross per 24 hour  Intake 105.74 ml  Output 2700 ml  Net -2594.26 ml   Filed Weights   10/07/19 1805  Weight: 99.8 kg    ROS: Review of Systems  Respiratory: Positive for cough. Negative for shortness of breath.   Cardiovascular: Negative for chest pain.  Gastrointestinal: Negative for abdominal pain, nausea and vomiting.   Exam: Physical Exam HENT:     Head: Normocephalic.     Mouth/Throat:     Pharynx: No oropharyngeal exudate.  Eyes:     General: Lids are normal.     Conjunctiva/sclera: Conjunctivae normal.  Cardiovascular:     Rate and Rhythm: Normal rate and regular rhythm.     Heart sounds: Normal heart sounds, S1 normal and S2 normal.  Pulmonary:     Breath sounds: Examination of the right-lower field reveals decreased breath sounds. Examination of the left-lower field reveals decreased breath sounds. Decreased breath sounds present. No wheezing, rhonchi or rales.  Abdominal:     Palpations: Abdomen is soft.     Tenderness: There is no abdominal tenderness.  Musculoskeletal:     Right lower leg: No swelling.     Left lower leg: No swelling.  Skin:    General: Skin is warm.     Findings: No rash.  Neurological:     Mental Status: He is alert.       Data Reviewed: Basic Metabolic Panel: Recent Labs  Lab  10/04/19 1253 10/07/19 1817 10/08/19 0154  NA 138 136  --   K 3.5 3.6  --   CL 101 100  --   CO2 28 23  --   GLUCOSE 101* 105*  --   BUN 21 22  --   CREATININE 0.97 0.90 1.02  CALCIUM 8.8* 8.3*  --    CBC: Recent Labs  Lab 10/04/19 1253 10/07/19 1817 10/08/19 0154  WBC 5.6 5.1 4.5  HGB 13.0 13.0 12.5*  HCT 39.3 36.5* 36.1*  MCV 91.6 85.7 86.2  PLT 177 151 144*  BNP (last 3 results) Recent Labs    10/08/19 0154  BNP 27.8    Recent Results (from the past 240 hour(s))  Urine Culture     Status: Abnormal   Collection Time: 10/04/19  7:32 PM   Specimen: Urine, Random  Result Value Ref Range Status   Specimen Description   Final    URINE, RANDOM Performed at [facility], [address], [place], [place] [address]    Special Requests   Final    NONE Performed at [facility], [address] [place], [place] [address]    Culture MULTIPLE SPECIES PRESENT, SUGGEST RECOLLECTION (A)  Final   Report Status 10/05/2019 FINAL  Final  Respiratory Panel by RT PCR (Flu A&B, Covid) -  Nasopharyngeal Swab     Status: Abnormal   Collection Time: 10/04/19  9:02 PM   Specimen: Nasopharyngeal Swab  Result Value Ref Range Status   SARS Coronavirus 2 by RT PCR POSITIVE (A) NEGATIVE Final    Comment: RESULT CALLED TO, READ BACK BY AND VERIFIED WITH: NIESHA WITTED @ 2202 10/04/2019 RH    Influenza A by PCR NEGATIVE NEGATIVE Final   Influenza B by PCR NEGATIVE NEGATIVE Final    Comment: (NOTE) The Xpert Xpress SARS-CoV-2/FLU/RSV assay is intended as an aid in  the diagnosis of influenza from Nasopharyngeal swab specimens and  should not be used as a sole basis for treatment. Nasal washings and  aspirates are unacceptable for Xpert Xpress SARS-CoV-2/FLU/RSV  testing.  Fact Sheet for Patients: PinkCheek.be  Fact Sheet for Healthcare Providers: GravelBags.it  This test is not yet approved or cleared by  the [place] FDA and  has been authorized for detection and/or diagnosis of SARS-CoV-2 by  FDA under an Emergency Use Authorization (EUA). This EUA will remain  in effect (meaning this test can be used) for the duration of the  Covid-19 declaration under Section 564(b)(1) of the Act, 21  U.S.C. section 360bbb-3(b)(1), unless the authorization is  terminated or revoked. Performed at [facility], [address] [place], [place] [address]   Urine Culture     Status: None   Collection Time: 10/08/19  7:09 AM   Specimen: Urine, Random  Result Value Ref Range Status   Specimen Description   Final    URINE, RANDOM Performed at [facility], [address] [place], [place] [address]    Special Requests   Final    NONE Performed at [facility], [address] [place], [place] [address]    Culture   Final    NO GROWTH Performed at [facility], [place] [address], [place], [place] [address]    Report Status 10/09/2019 FINAL  Final     Studies: DG Chest 2 View  Result Date: 10/07/2019 CLINICAL DATA:  Weakness, COVID EXAM: CHEST - 2 VIEW COMPARISON:  10/04/2019 FINDINGS: No focal opacity or pleural effusion. Mild cardiomegaly with aortic atherosclerosis. No pneumothorax. Mild vertebral wedging at the midthoracic spine. IMPRESSION: No active cardiopulmonary disease. Mild cardiomegaly. Electronically Signed   By: Donavan Foil M.D.   On: 10/07/2019 23:58    Scheduled Meds: . amLODipine  5 mg Oral Daily  . vitamin C  500 mg Oral Daily  . carbidopa-levodopa  1 tablet Oral TID WC & HS  . Chlorhexidine Gluconate Cloth  6 each Topical Daily  . cholecalciferol  1,000 Units Oral Daily  . enoxaparin (LOVENOX) injection  40 mg Subcutaneous Q24H  . furosemide  20 mg Oral QODAY  . melatonin  5 mg Oral QHS  . multivitamin with minerals  1 tablet Oral Daily  . pantoprazole  40 mg Oral Daily  . traZODone  50 mg Oral QHS  . zinc sulfate  220 mg Oral Daily    Continuous Infusions: . sodium chloride 250 mL (10/09/19 1139)  . cefTRIAXone (ROCEPHIN)  IV Stopped (10/08/19 2038)  . remdesivir 100 mg in NS 100 mL 100 mg (10/09/19 1141)    Assessment/Plan:  1. COVID-19 infection.  Patient started on remdesivir day 2.  Hold off on steroids since not hypoxic and chest x-ray does not show pneumonia.  Patient received the monoclonal antibody previously. 2. Acute delirium with underlying Lewy body dementia.  Continue melatonin at  night.  Hold off on steroids.  Trazodone at night.  As needed Seroquel at night.  As needed Risperdal during the day. 3. Weakness.  Physical therapy recommends rehab. 4. GERD on PPI 5. Essential hypertension on Norvasc 6. Since urine culture negative will discontinue Rocephin 7. Pressure injury deep tissue with skin blister, present on admission.  See description below.  Pressure Injury 10/08/19 Buttocks Left;Upper Deep Tissue Pressure Injury - Purple or maroon localized area of discolored intact skin or blood-filled blister due to damage of underlying soft tissue from pressure and/or shear. (Active)  10/08/19 0325  Location: Buttocks  Location Orientation: Left;Upper  Staging: Deep Tissue Pressure Injury - Purple or maroon localized area of discolored intact skin or blood-filled blister due to damage of underlying soft tissue from pressure and/or shear.  Wound Description (Comments):   Present on Admission: Yes       Code Status:     Code Status Orders  (From admission, onward)         Start     Ordered   10/08/19 0215  Full code  Continuous        10/08/19 0216        Code Status History    Date Active Date Inactive Code Status Order ID Comments User Context   11/06/2018 1728 11/07/2018 1522 Full Code [id number]  [name], MD Inpatient   09/09/2018 0417 09/11/2018 2037 Full Code [id number]  Vaughan Basta, MD ED   Advance Care Planning Activity     Family Communication: Spoke with daughter on  the phone Disposition Plan: Status is: Inpatient  Dispo: The patient is from: Home              Anticipated d/c is to: Rehab              Anticipated d/c date is: 10/12/2019              Patient currently currently being treated for COVID-19 infection  Time spent: 27 minutes  [place]

## 2019-10-09 NOTE — Progress Notes (Signed)
Pt with hallucinations, impulsive, setting off bed alarm. Unable to reorient. Per report pt has been been sleeping. Night time schedule sleep meds given with no results. NP, BRandol Kern notified  Mg Haldol given as per order.  Pt now sleeping comfortable. Continuing to monitor  Pt.

## 2019-10-10 DIAGNOSIS — L89301 Pressure ulcer of unspecified buttock, stage 1: Secondary | ICD-10-CM

## 2019-10-10 LAB — COMPREHENSIVE METABOLIC PANEL
ALT: 8 U/L (ref 0–44)
AST: 53 U/L — ABNORMAL HIGH (ref 15–41)
Albumin: 3.5 g/dL (ref 3.5–5.0)
Alkaline Phosphatase: 55 U/L (ref 38–126)
Anion gap: 12 (ref 5–15)
BUN: 24 mg/dL — ABNORMAL HIGH (ref 8–23)
CO2: 27 mmol/L (ref 22–32)
Calcium: 8.5 mg/dL — ABNORMAL LOW (ref 8.9–10.3)
Chloride: 99 mmol/L (ref 98–111)
Creatinine, Ser: 0.84 mg/dL (ref 0.61–1.24)
GFR calc Af Amer: >60 mL/min (ref >60)
GFR calc non Af Amer: >60 mL/min (ref >60)
Glucose, Bld: 110 mg/dL — ABNORMAL HIGH (ref 70–99)
Potassium: 3.3 mmol/L — ABNORMAL LOW (ref 3.5–5.1)
Sodium: 138 mmol/L (ref 135–145)
Total Bilirubin: 1.8 mg/dL — ABNORMAL HIGH (ref 0.3–1.2)
Total Protein: 7.3 g/dL (ref 6.5–8.1)

## 2019-10-10 LAB — C-REACTIVE PROTEIN: CRP: 6 mg/dL — ABNORMAL HIGH (ref <1.0)

## 2019-10-10 LAB — FIBRIN DERIVATIVES D-DIMER (ARMC ONLY): Fibrin derivatives D-dimer (ARMC): 988.23 ng/mL (FEU) — ABNORMAL HIGH (ref 0.00–499.00)

## 2019-10-10 LAB — FERRITIN: Ferritin: 179 ng/mL (ref 24–336)

## 2019-10-10 MED ORDER — POTASSIUM CHLORIDE CRYS ER 20 MEQ PO TBCR
40.0000 meq | EXTENDED_RELEASE_TABLET | Freq: Once | ORAL | Status: AC
  Administered 2019-10-10: 40 meq via ORAL
  Filled 2019-10-10: qty 2

## 2019-10-10 NOTE — Progress Notes (Signed)
Patient ID: Randy [name], male   DOB: Nov 01, 1934, 85 y.o.   MRN: [id number] [facility] PROGRESS NOTE  Randy [name] [id number]:[id number] DOB: 04-08-1934 DOA: 10/08/2019 PCP: [name], DO  HPI/Subjective: Patient seen this morning.  I did able to awaken him but he went right back to sleep.  Patient admitted with acute metabolic encephalopathy and COVID-19 infection.  Objective: Vitals:   10/10/19 0458 10/10/19 0842  BP: (!) 159/95 (!) 159/94  Pulse: 81 89  Resp: 18 17  Temp: 99.5 F (37.5 C) 98.8 F (37.1 C)  SpO2: 95% 95%    Intake/Output Summary (Last 24 hours) at 10/10/2019 1204 Last data filed at 10/10/2019 0649 Gross per 24 hour  Intake 109.05 ml  Output 650 ml  Net -540.95 ml   Filed Weights   10/07/19 1805  Weight: 99.8 kg    ROS: Review of Systems  Unable to perform ROS: Acuity of condition   Exam: Physical Exam HENT:     Head: Normocephalic.     Comments: Unable to look in the mouth today Eyes:     General: Lids are normal.     Conjunctiva/sclera: Conjunctivae normal.  Cardiovascular:     Rate and Rhythm: Normal rate and regular rhythm.     Heart sounds: Normal heart sounds, S1 normal and S2 normal.  Pulmonary:     Breath sounds: Examination of the right-lower field reveals decreased breath sounds. Examination of the left-lower field reveals decreased breath sounds. Decreased breath sounds present. No wheezing, rhonchi or rales.  Abdominal:     Palpations: Abdomen is soft.     Tenderness: There is no abdominal tenderness.  Musculoskeletal:     Right lower leg: Swelling present.     Left lower leg: Swelling present.  Skin:    General: Skin is warm.     Findings: No rash.  Neurological:     Mental Status: He is lethargic.       Data Reviewed: Basic Metabolic Panel: Recent Labs  Lab 10/04/19 1253 10/07/19 1817 10/08/19 0154 10/10/19 0406  NA 138 136  --  138  K 3.5 3.6  --  3.3*  CL 101 100  --  99  CO2 28 23  --  27   GLUCOSE 101* 105*  --  110*  BUN 21 22  --  24*  CREATININE 0.97 0.90 1.02 0.84  CALCIUM 8.8* 8.3*  --  8.5*   Liver Function Tests: Recent Labs  Lab 10/10/19 0406  AST 53*  ALT 8  ALKPHOS 55  BILITOT 1.8*  PROT 7.3  ALBUMIN 3.5   CBC: Recent Labs  Lab 10/04/19 1253 10/07/19 1817 10/08/19 0154  WBC 5.6 5.1 4.5  HGB 13.0 13.0 12.5*  HCT 39.3 36.5* 36.1*  MCV 91.6 85.7 86.2  PLT 177 151 144*   BNP (last 3 results) Recent Labs    10/08/19 0154  BNP 27.8     Recent Results (from the past 240 hour(s))  Urine Culture     Status: Abnormal   Collection Time: 10/04/19  7:32 PM   Specimen: Urine, Random  Result Value Ref Range Status   Specimen Description   Final    URINE, RANDOM Performed at [facility], [address] [place], [place] [address]    Special Requests   Final    NONE Performed at [facility], [address], [place], [place] [address]    Culture MULTIPLE SPECIES PRESENT, SUGGEST RECOLLECTION (A)  Final  Report Status 10/05/2019 FINAL  Final  Respiratory Panel by RT PCR (Flu A&B, Covid) - Nasopharyngeal Swab     Status: Abnormal   Collection Time: 10/04/19  9:02 PM   Specimen: Nasopharyngeal Swab  Result Value Ref Range Status   SARS Coronavirus 2 by RT PCR POSITIVE (A) NEGATIVE Final    Comment: RESULT CALLED TO, READ BACK BY AND VERIFIED WITH: NIESHA WITTED @ 2202 10/04/2019 RH    Influenza A by PCR NEGATIVE NEGATIVE Final   Influenza B by PCR NEGATIVE NEGATIVE Final    Comment: (NOTE) The Xpert Xpress SARS-CoV-2/FLU/RSV assay is intended as an aid in  the diagnosis of influenza from Nasopharyngeal swab specimens and  should not be used as a sole basis for treatment. Nasal washings and  aspirates are unacceptable for Xpert Xpress SARS-CoV-2/FLU/RSV  testing.  Fact Sheet for Patients: PinkCheek.be  Fact Sheet for Healthcare Providers: GravelBags.it  This  test is not yet approved or cleared by the [place] FDA and  has been authorized for detection and/or diagnosis of SARS-CoV-2 by  FDA under an Emergency Use Authorization (EUA). This EUA will remain  in effect (meaning this test can be used) for the duration of the  Covid-19 declaration under Section 564(b)(1) of the Act, 21  U.S.C. section 360bbb-3(b)(1), unless the authorization is  terminated or revoked. Performed at [facility], [address] [place], [place] [address]   Urine Culture     Status: None   Collection Time: 10/08/19  7:09 AM   Specimen: Urine, Random  Result Value Ref Range Status   Specimen Description   Final    URINE, RANDOM Performed at [facility], [address], [place], [place] [address]    Special Requests   Final    NONE Performed at [facility], [address], [place], [place] [address]    Culture   Final    NO GROWTH Performed at [facility], [place] [address], [place], [place] [address]    Report Status 10/09/2019 FINAL  Final     Scheduled Meds: . amLODipine  5 mg Oral Daily  . vitamin C  500 mg Oral Daily  . carbidopa-levodopa  1 tablet Oral TID WC & HS  . Chlorhexidine Gluconate Cloth  6 each Topical Daily  . cholecalciferol  1,000 Units Oral Daily  . enoxaparin (LOVENOX) injection  40 mg Subcutaneous Q24H  . furosemide  20 mg Oral QODAY  . mouth rinse  15 mL Mouth Rinse BID  . melatonin  5 mg Oral QHS  . multivitamin with minerals  1 tablet Oral Daily  . pantoprazole  40 mg Oral Daily  . traZODone  50 mg Oral QHS  . zinc sulfate  220 mg Oral Daily   Continuous Infusions: . sodium chloride 250 mL (10/10/19 1120)  . remdesivir 100 mg in NS 100 mL 100 mg (10/10/19 1122)    Assessment/Plan:  1. Acute delirium with underlying Lewy body dementia.  Continue melatonin at night.  Continue trazodone at night.  Do not give any steroids.  As needed risperidone as needed.  Get rid of  Seroquel. 2. COVID-19 infection.  Remdesivir day 3.  Hold off on steroids since not hypoxic and chest x-ray does not show pneumonia. 3. Weakness.  Physical therapy recommends rehab. 4. Essential hypertension.  Continue Norvasc 5. GERD.  Continue PPI 6. Stage I pressure injury deep tissue with skin blister.  Present on admission.  See description below.  Pressure Injury 10/08/19 Buttocks Left;Upper Deep Tissue Pressure Injury - Purple or maroon localized area of discolored intact skin or blood-filled blister due to damage of underlying soft tissue from pressure and/or shear. (Active)  10/08/19 0325  Location: Buttocks  Location Orientation: Left;Upper  Staging: Deep Tissue Pressure Injury - Purple or maroon localized area of discolored intact skin or blood-filled blister due to damage of underlying soft tissue from pressure and/or shear.  Wound Description (Comments):   Present on Admission: Yes       Code Status:     Code Status Orders  (From admission, onward)         Start     Ordered   10/08/19 0215  Full code  Continuous        10/08/19 0216        Code Status History    Date Active Date Inactive Code Status Order ID Comments User Context   11/06/2018 1728 11/07/2018 1522 Full Code [id number]  [name], MD Inpatient   09/09/2018 0417 09/11/2018 2037 Full Code [id number]  Vaughan Basta, MD ED   Advance Care Planning Activity     Family Communication: Spoke with daughter on the phone Disposition Plan: Status is: Inpatient  Dispo: The patient is from: Home              Anticipated d/c is to: Rehab              Anticipated d/c date is: Earliest potential will be 10/12/2019              Patient currently with acute delirium secondary to Lewy body dementia.  Also being treated for COVID-19 infection with remdesivir day 3.  Time spent: 26 minutes, case discussed with nursing staff  [name]  [facility]

## 2019-10-10 NOTE — Plan of Care (Signed)

## 2019-10-10 NOTE — NC FL2 (Signed)
[place] LEVEL OF CARE SCREENING TOOL     IDENTIFICATION  Patient Name: Randy [name] Birthdate: [birth year] Sex: male Admission Date (Current Location): 10/08/2019  [place] and [place] Number:  Engineering geologist and Address:  [facility], [address], [place], [place] [address]      Provider Number: [id number]  Attending Physician Name and Address:  [name], MD  Relative Name and Phone Number:  [name]- [phone]    Current Level of Care: Hospital Recommended Level of Care: [place] Prior Approval Number:    Date Approved/Denied:   PASRR Number:    Discharge Plan: SNF    Current Diagnoses: Patient Active Problem List   Diagnosis Date Noted  . Pressure injury of buttock, stage 1   . Delirium   . COVID-19 virus infection 10/08/2019  . Unable to care for self 10/08/2019  . Caregiver unable to cope 10/08/2019  . Recurrent falls 10/08/2019  . Lewy body dementia without behavioral disturbance ([place])   . Prostate cancer ([place]) 06/30/2019  . BPH with urinary obstruction 11/06/2018  . Acute cholecystitis 09/08/2018  . Myalgia due to statin 02/24/2018  . Tricompartment osteoarthritis of left knee 11/14/2017  . Generalized weakness 04/28/2017  . Chronic low back pain 03/12/2017  . Knee pain, chronic 10/01/2016  . Chronic venous insufficiency 08/30/2016  . BPH without obstruction/lower urinary tract symptoms 03/06/2016  . Osteoarthritis of multiple joints 03/06/2016  . Degenerative joint disease (DJD) of lumbar spine 03/06/2016  . Essential hypertension 03/05/2016  . Hyperlipidemia 03/05/2016  . Osteoporosis 03/05/2016  . GERD (gastroesophageal reflux disease) 03/05/2016  . Bilateral lower extremity edema 03/05/2016    Orientation RESPIRATION BLADDER Height & Weight     Self  Normal   Weight: 99.8 kg Height:  6' (182.9 cm)  BEHAVIORAL SYMPTOMS/MOOD NEUROLOGICAL BOWEL NUTRITION STATUS       Continent Diet  AMBULATORY STATUS COMMUNICATION OF NEEDS Skin   Extensive Assist   Normal                       Personal Care Assistance Level of Assistance  Bathing, Feeding, Dressing   Feeding assistance: Limited assistance Dressing Assistance: Limited assistance     Functional Limitations Info  Sight Sight Info: Impaired        SPECIAL CARE FACTORS FREQUENCY  PT (By licensed PT)     PT Frequency: 3x weekly              Contractures Contractures Info: Not present    Additional Factors Info                  Current Medications (10/10/2019):  This is the current hospital active medication list Current Facility-Administered Medications  Medication Dose Route Frequency Provider Last Rate Last Admin  . 0.9 %  sodium chloride infusion   Intravenous PRN [name], MD 10 mL/hr at 10/10/19 1120 250 mL at 10/10/19 1120  . acetaminophen (TYLENOL) tablet 650 mg  650 mg Oral Q6H PRN Athena Masse, MD      . amLODipine (NORVASC) tablet 5 mg  5 mg Oral Daily [name], MD   5 mg at 10/10/19 1107  . ascorbic acid (VITAMIN C) tablet 500 mg  500 mg Oral Daily Athena Masse, MD   500 mg at 10/10/19 1106  . carbidopa-levodopa (SINEMET IR) 25-100 MG per tablet immediate release 1 tablet  1 tablet Oral TID WC & HS  [name], MD   1 tablet at 10/10/19 1630  . Chlorhexidine Gluconate Cloth 2 % PADS 6 each  6 each Topical Daily [name], MD   6 each at 10/10/19 1110  . cholecalciferol (VITAMIN D3) tablet 1,000 Units  1,000 Units Oral Daily [name], MD   1,000 Units at 10/10/19 1108  . diclofenac Sodium (VOLTAREN) 1 % topical gel 2 g  2 g Topical QID PRN Wieting, Richard, MD      . enoxaparin (LOVENOX) injection 40 mg  40 mg Subcutaneous Q24H Athena Masse, MD   40 mg at 10/09/19 2125  . fluticasone (FLONASE) 50 MCG/ACT nasal spray 2 spray  2 spray Each Nare Daily PRN Wieting, Richard, MD      . furosemide (LASIX) tablet 20 mg  20 mg Oral  Gertie Gowda, Richard, MD   20 mg at 10/10/19 1107  . MEDLINE mouth rinse  15 mL Mouth Rinse BID [name], MD   15 mL at 10/10/19 1110  . melatonin tablet 5 mg  5 mg Oral QHS [name], MD   5 mg at 10/09/19 2125  . multivitamin with minerals tablet 1 tablet  1 tablet Oral Daily Athena Masse, MD   1 tablet at 10/10/19 1106  . ondansetron (ZOFRAN) tablet 4 mg  4 mg Oral Q6H PRN Athena Masse, MD       Or  . ondansetron [facility]) injection 4 mg  4 mg Intravenous Q6H PRN Athena Masse, MD      . pantoprazole (PROTONIX) EC tablet 40 mg  40 mg Oral Daily [name], MD   40 mg at 10/10/19 1108  . remdesivir 100 mg in sodium chloride 0.9 % 100 mL IVPB  100 mg Intravenous Daily Lu Duffel, RPH 200 mL/hr at 10/10/19 1122 100 mg at 10/10/19 1122  . risperiDONE (RISPERDAL) tablet 0.5 mg  0.5 mg Oral BID PRN [name], MD      . traZODone (DESYREL) tablet 50 mg  50 mg Oral QHS [name], MD   50 mg at 10/09/19 2125  . zinc sulfate capsule 220 mg  220 mg Oral Daily Athena Masse, MD   220 mg at 10/10/19 1106     Discharge Medications: Please see discharge summary for a list of discharge medications.  Relevant Imaging Results:  Relevant Lab Results:   Additional Information    Susanna Benge, Nonda Lou, RN

## 2019-10-10 NOTE — TOC Progression Note (Signed)
Transition of Care [facility]) - Progression Note    Patient Details  Name: CAULDER WEHNER MRN: [id number] Date of Birth: [birth year]  [facility] CM/SW Contact  Hicks Feick, Nonda Lou, [place] Phone Number: 10/10/2019, 6:10 PM  Clinical Narrative:   TOC TEAM completed FL-2 and faxed to prospective SNFs for bed search.         Expected Discharge Plan and Services                                                 Social Determinants of Health (SDOH) Interventions    Readmission Risk Interventions No flowsheet data found.

## 2019-10-11 ENCOUNTER — Ambulatory Visit (INDEPENDENT_AMBULATORY_CARE_PROVIDER_SITE_OTHER): Payer: Medicare Other | Admitting: General Practice

## 2019-10-11 DIAGNOSIS — E782 Mixed hyperlipidemia: Secondary | ICD-10-CM

## 2019-10-11 DIAGNOSIS — F0280 Dementia in other diseases classified elsewhere without behavioral disturbance: Secondary | ICD-10-CM

## 2019-10-11 DIAGNOSIS — U071 COVID-19: Principal | ICD-10-CM

## 2019-10-11 DIAGNOSIS — I10 Essential (primary) hypertension: Secondary | ICD-10-CM | POA: Diagnosis not present

## 2019-10-11 DIAGNOSIS — Z748 Other problems related to care provider dependency: Secondary | ICD-10-CM

## 2019-10-11 DIAGNOSIS — G3183 Dementia with Lewy bodies: Secondary | ICD-10-CM

## 2019-10-11 DIAGNOSIS — R531 Weakness: Secondary | ICD-10-CM

## 2019-10-11 DIAGNOSIS — Z789 Other specified health status: Secondary | ICD-10-CM

## 2019-10-11 DIAGNOSIS — K219 Gastro-esophageal reflux disease without esophagitis: Secondary | ICD-10-CM

## 2019-10-11 DIAGNOSIS — R410 Disorientation, unspecified: Secondary | ICD-10-CM

## 2019-10-11 DIAGNOSIS — L89301 Pressure ulcer of unspecified buttock, stage 1: Secondary | ICD-10-CM

## 2019-10-11 LAB — COMPREHENSIVE METABOLIC PANEL
ALT: 8 U/L (ref 0–44)
AST: 52 U/L — ABNORMAL HIGH (ref 15–41)
Albumin: 3.4 g/dL — ABNORMAL LOW (ref 3.5–5.0)
Alkaline Phosphatase: 50 U/L (ref 38–126)
Anion gap: 11 (ref 5–15)
BUN: 28 mg/dL — ABNORMAL HIGH (ref 8–23)
CO2: 30 mmol/L (ref 22–32)
Calcium: 8.6 mg/dL — ABNORMAL LOW (ref 8.9–10.3)
Chloride: 99 mmol/L (ref 98–111)
Creatinine, Ser: 0.71 mg/dL (ref 0.61–1.24)
GFR calc Af Amer: >60 mL/min (ref >60)
GFR calc non Af Amer: >60 mL/min (ref >60)
Glucose, Bld: 103 mg/dL — ABNORMAL HIGH (ref 70–99)
Potassium: 3.3 mmol/L — ABNORMAL LOW (ref 3.5–5.1)
Sodium: 140 mmol/L (ref 135–145)
Total Bilirubin: 1.9 mg/dL — ABNORMAL HIGH (ref 0.3–1.2)
Total Protein: 7.3 g/dL (ref 6.5–8.1)

## 2019-10-11 LAB — FERRITIN: Ferritin: 172 ng/mL (ref 24–336)

## 2019-10-11 LAB — C-REACTIVE PROTEIN: CRP: 6 mg/dL — ABNORMAL HIGH (ref <1.0)

## 2019-10-11 LAB — FIBRIN DERIVATIVES D-DIMER (ARMC ONLY): Fibrin derivatives D-dimer (ARMC): 949.78 ng/mL (FEU) — ABNORMAL HIGH (ref 0.00–499.00)

## 2019-10-11 MED ORDER — POLYETHYLENE GLYCOL 3350 17 G PO PACK
17.0000 g | PACK | Freq: Every day | ORAL | Status: DC
  Administered 2019-10-11 – 2019-10-12 (×2): 17 g via ORAL
  Filled 2019-10-11 (×2): qty 1

## 2019-10-11 MED ORDER — POTASSIUM CHLORIDE CRYS ER 20 MEQ PO TBCR
40.0000 meq | EXTENDED_RELEASE_TABLET | Freq: Once | ORAL | Status: AC
  Administered 2019-10-11: 14:00:00 40 meq via ORAL
  Filled 2019-10-11: qty 2

## 2019-10-11 MED ORDER — OLANZAPINE 5 MG PO TABS
5.0000 mg | ORAL_TABLET | Freq: Every evening | ORAL | Status: DC | PRN
  Administered 2019-10-11: 21:00:00 5 mg via ORAL
  Filled 2019-10-11 (×3): qty 1

## 2019-10-11 NOTE — Care Management Important Message (Signed)
Important Message  Patient Details  Name: JAXSUN CIAMPI MRN: [id number] Date of Birth: October 31, 1934   Medicare Important Message Given:  Yes  Reviewed with daughter, [name], at [phone].  Aware of Medicare IM right.  Declined copy at this time as one being mailed by [facility] already.     Dannette Barbara 10/11/2019, 1:34 PM

## 2019-10-11 NOTE — Chronic Care Management (AMB) (Signed)
Chronic Care Management   Follow Up Note   10/11/2019 Name: LEMOINE GOYNE MRN: [id number] DOB: January 14, 1934  Referred by: [name], DO Reason for referral : Chronic Care Management (RNCM Follow up after message left by his daughter [name]- for Chronic Disease Managment and Care Coordination Needs)   CADDEN ELIZONDO is a 85 y.o. year old male who is a primary care patient of [name], DO. The CCM team was consulted for assistance with chronic disease management and care coordination needs.    Review of patient status, including review of consultants reports, relevant laboratory and other test results, and collaboration with appropriate care team members and the patient's provider was performed as part of comprehensive patient evaluation and provision of chronic care management services.    SDOH (Social Determinants of Health) assessments performed: Yes See Care Plan activities for detailed interventions related to [place])     Facility-Administered Encounter Medications as of 10/11/2019  Medication  . 0.9 %  sodium chloride infusion  . acetaminophen (TYLENOL) tablet 650 mg  . amLODipine (NORVASC) tablet 5 mg  . ascorbic acid (VITAMIN C) tablet 500 mg  . carbidopa-levodopa (SINEMET IR) 25-100 MG per tablet immediate release 1 tablet  . Chlorhexidine Gluconate Cloth 2 % PADS 6 each  . cholecalciferol (VITAMIN D3) tablet 1,000 Units  . diclofenac Sodium (VOLTAREN) 1 % topical gel 2 g  . enoxaparin (LOVENOX) injection 40 mg  . fluticasone (FLONASE) 50 MCG/ACT nasal spray 2 spray  . furosemide (LASIX) tablet 20 mg  . MEDLINE mouth rinse  . melatonin tablet 5 mg  . multivitamin with minerals tablet 1 tablet  . ondansetron (ZOFRAN) tablet 4 mg   Or  . ondansetron (ZOFRAN) injection 4 mg  . pantoprazole (PROTONIX) EC tablet 40 mg  . remdesivir 100 mg in sodium chloride 0.9 % 100 mL IVPB  . risperiDONE (RISPERDAL) tablet 0.5 mg  . traZODone (DESYREL) tablet 50 mg    . zinc sulfate capsule 220 mg   Outpatient Encounter Medications as of 10/11/2019  Medication Sig  . carbidopa-levodopa (SINEMET IR) 25-100 MG tablet Take 1 tablet by mouth 4 (four) times daily.   . cholecalciferol (VITAMIN D) 1000 units tablet Take 1,000 Units by mouth daily.  . Coenzyme Q10 (CO Q 10) 100 MG CAPS Take 100 mg by mouth daily.   . diclofenac Sodium (VOLTAREN) 1 % GEL Apply 2 g topically 4 (four) times daily as needed. To knee arthritis pain  . enalapril (VASOTEC) 20 MG tablet TAKE 1 TABLET BY MOUTH TWICE A DAY (Patient taking differently: Take 20 mg by mouth 2 (two) times daily. )  . fluticasone (FLONASE) 50 MCG/ACT nasal spray Place 2 sprays into both nostrils daily as needed for allergies.   . furosemide (LASIX) 20 MG tablet Take 1 tablet (20 mg total) by mouth every other day.  . levofloxacin (LEVAQUIN) 750 MG tablet Take 1 tablet (750 mg total) by mouth daily.  . melatonin 5 MG TABS Take 5 mg by mouth at bedtime as needed (sleep).  . Omega-3 Fatty Acids (FISH OIL) 1200 MG CAPS Take 1,200 mg by mouth 2 (two) times daily.   [name] omeprazole (PRILOSEC) 20 MG capsule Take 20 mg by mouth daily.  . vitamin C (ASCORBIC ACID) 500 MG tablet Take 500 mg by mouth daily.     Objective:   Goals Addressed            This Visit's Progress   . RNCM: Recently Dad  has had swelling in both of his legs and feet       Current Barriers:  [name] Knowledge Deficits related to management of HTN as evidence of swelling in feet and legs, not always being able to eat a heart healthy diet . Cognitive Deficits  Nurse Case Manager Clinical Goal(s):  [name] Over the next 90 days, patient will verbalize understanding of plan for managing HTN and HLD . Over the next 90 days, patient will work with [facility] pharmacist and pcp to address needs related to managing HTN and increased swelling in feet and legs . Over the next 90 days, patient will demonstrate a decrease in bilateral lower extremity edema  exacerbations as evidenced by normalized blood pressures and no evidence of edema present in bilateral lower legs . Over the next 90 days, patient will demonstrate improved adherence to prescribed treatment plan for wearing knee high ted hose as evidenced bydecreased bilateral lower leg edema . Over the next 90 days, patient will work with CM team pharmacist to review medications as needed and take Lasix as prescribed by pcp . Over the next 90 days, the patient will demonstrate ongoing self health care management ability as evidenced by elevating legs when sitting, taking meds as prescribed, following a heart healthy diet, and wearing ted hose to reduce bilateral lower leg edema.  Interventions:  . Evaluation of current treatment plan related to HTN and HLD and patient's adherence to plan as established by provider. 10-04-2019: The daughter states that the patient is having shortness of breath, a cough, and edema in bilateral legs that is worse in his right foot. States the cough has not been productive. Evaluation if the patient has been around anyone that is COVID positive and the daughter states not that she is aware of. The patient has several issues going on with knee pain with limited mobility and GI bleeding that is not better. The patient feels he needs to be evaluated at the hospital and after eating he has requested for his daughter to call EMS. Empathetic listening and support given. 10-11-2019: The patient is currently inpatient at [facility]. The patient will be going to "[facility]" likely tomorrow, per the daughter for rehabilitation.  The patient tested positive for COVID 19 and received the Immuglobin infusion on Wednesday. The patient was not getting better and was hospitalized on 10-08-2019 for weakness and UTI.  The patient also had confusion. Explained this was likely due to the UTI. The daughter had called and left a VM asking for the [facility] to help.  The whole family  is COVID positive and she was concerned because she could not provide care for the patient. Empathetic listening and support given.  . Provided education to patient re: best way to put on ted hose with the use of a plastic grocery bag for ease of putting on hose. Also alternative compression socks" found on [place] or at [place] that my be beneficial for the patient. 10-04-2019: The patients daughter states that the patient is still having issues putting on the ted hose even with the ideas given to them with the grocery bags.  The daughter did get the patient some diabetic socks.He can put those on. Education given on elevation of legs when sitting and to try ace wraps, just make sure they are not too tight around his legs. The patient is having worsening swelling today and also shortness of breath. The patients daughter was out of town this weekend but endorses  compliance with medications. The patient has several health issues impacting his care right now. 10-11-2019: The daughter is happy the patient is going to rehab.  She is working with the case manager at the hospital to facilitate the patients care. Education on the patient following up with the pcp after his stay at rehab.  The daughter verbalized the patient was very week.  Education and support given. Education on UTI also with the confusion being exacerbated by the UTI. The daughter now feels the patient was not having a GI bleed but blood in his urine. The patient is on antibiotics. Will continue to monitor and follow up accordingly.  . Reviewed medications with patient and discussed the use of Lasix. The daughter verbalized the patient did not have any Lasix. The patient has been started back on Lasix and per the daughter today the Lasix has been effective in helping with the swelling and she has noted decrease in swelling. 08-09-2019: the patient is taking lasix 20 mg QOD as prescribed. The patients daughter notes that this is helping with the swelling in  his legs. . Discussed plans with patient for ongoing care management follow up and provided patient with direct contact information for care management team . Advised patient, providing education and rationale, to monitor blood pressure daily and record, calling pcp for findings outside established parameters.  . Provided patient with heart healthy  educational materials related to adhering to heart healthy diet  . Pharmacy referral for medication change follow up and support . Educated daughter on the [place] functionality to view notes/lab work and communicate with the pcp team  Patient Self Care Activities:  . Patient verbalizes understanding of plan to use Lasix prn for swelling in feet and legs per MD recommendations . Attends all scheduled provider appointments . Calls provider office for new concerns or questions . Unable to independently manage bilateral lower leg edema  Please see past updates related to this goal by clicking on the "Past Updates" button in the selected goal          Plan:   Telephone follow up appointment with care management team member scheduled for:  11-08-2019 at [place] am   [place], MSN, [facility] Mobile: [phone]

## 2019-10-11 NOTE — TOC Initial Note (Signed)
Transition of Care [facility]) - Initial/Assessment Note    Patient Details  Name: Randy [name] MRN: [id number] Date of Birth: March 25, 1934  Transition of [facility]) CM/SW Contact:    Shelbie Hutching, RN Phone Number: 10/11/2019, 9:15 AM  Clinical Narrative:                 Patient admitted to the hospital with COVID, history of [place] Body Dementia.  Patient is from home with his daughter, Randy [name], and his wife.  RNCM able to speak with daughter via phone.  Randy [name] reports that patient is very independent at home and is usually able to complete all ADL's.  Patient has a lift chair, cane, and walker.  Patient usually walks with his cane but with COVID has been very weak and using his walker.  PT has recommended short term rehab at discharge and daughter agrees. Daughter is okay with [facility].  Patient should be ready for discharge tomorrow.  Bed request sent to [facility].   Expected Discharge Plan: [place] Barriers to Discharge: Continued Medical Work up   Patient Goals and CMS Choice Patient states their goals for this hospitalization and ongoing recovery are:: Daugher wants the patient to get his strength back before coming home CMS Medicare.gov Compare Post Acute Care list provided to:: Patient Represenative (must comment) Choice offered to / list presented to : Adult Children  Expected Discharge Plan and Services Expected Discharge Plan: [place]   Discharge Planning Services: CM Consult Post Acute Care Choice: [place] Living arrangements for the past 2 months: Single Family Home                   DME Agency: NA       HH Arranged: NA          Prior Living Arrangements/Services Living arrangements for the past 2 months: Single Family Home Lives with:: Spouse, Adult Children Patient language and need for interpreter reviewed:: Yes Do you feel safe going back to the place where you live?: Yes      Need for Family Participation  in Patient Care: Yes (Comment) (COVID and dementia) Care giver support system in place?: Yes (comment) (daughter and wife) Current home services: DME Multimedia programmer, cane, walker) Criminal Activity/Legal Involvement Pertinent to Current Situation/Hospitalization: No - Comment as needed  Activities of Daily Living      Permission Sought/Granted Permission sought to share information with : Case Manager, Family Supports, Customer service manager Permission granted to share information with : Yes, Verbal Permission Granted  Share Information with NAME: Randy [name]  Permission granted to share info w AGENCY: [facility] granted to share info w Relationship: daughter  Permission granted to share info w Contact Information: [phone]  Emotional Assessment   Attitude/Demeanor/Rapport: Unable to Assess Affect (typically observed): Unable to Assess   Alcohol / Substance Use: Not Applicable Psych Involvement: No (comment)  Admission diagnosis:  Generalized weakness [R53.1] COVID-19 [U07.1] Patient Active Problem List   Diagnosis Date Noted  . Pressure injury of buttock, stage 1   . Delirium   . COVID-19 virus infection 10/08/2019  . Unable to care for self 10/08/2019  . Caregiver unable to cope 10/08/2019  . Recurrent falls 10/08/2019  . Lewy body dementia without behavioral disturbance ([place])   . Prostate cancer ([place]) 06/30/2019  . BPH with urinary obstruction 11/06/2018  . Acute cholecystitis 09/08/2018  . Myalgia due to statin 02/24/2018  . Tricompartment osteoarthritis  of left knee 11/14/2017  . Generalized weakness 04/28/2017  . Chronic low back pain 03/12/2017  . Knee pain, chronic 10/01/2016  . Chronic venous insufficiency 08/30/2016  . BPH without obstruction/lower urinary tract symptoms 03/06/2016  . Osteoarthritis of multiple joints 03/06/2016  . Degenerative joint disease (DJD) of lumbar spine 03/06/2016  . Essential hypertension 03/05/2016   . Hyperlipidemia 03/05/2016  . Osteoporosis 03/05/2016  . GERD (gastroesophageal reflux disease) 03/05/2016  . Bilateral lower extremity edema 03/05/2016   PCP:  [name], DO Pharmacy:   [facility] [address] [place] [address] Phone: [phone] Fax: [phone]  [id number] [place], [address] [place] [address] Phone: [phone] Fax: [phone]     Social Determinants of Health (SDOH) Interventions    Readmission Risk Interventions Readmission Risk Prevention Plan 10/11/2019  Transportation Screening Complete  PCP or Specialist Appt within 3-5 Days Complete  HRI or [place] Complete  Social Work Consult for [place] Planning/Counseling Complete  Palliative Care Screening Not Applicable  Medication Review Press photographer) Complete  Some recent data might be hidden

## 2019-10-11 NOTE — Progress Notes (Signed)
Attempted to call daughter at [phone] twice from pt's room. Phone directed me to voicemail twice.

## 2019-10-11 NOTE — Progress Notes (Signed)
Patient ID: Randy [name], male   DOB: April 03, 1934, 85 y.o.   MRN: [id number] [facility] PROGRESS NOTE  Randy [name] WUX:[id number] DOB: [birth year] DOA: 10/08/2019 PCP: [name], DO  HPI/Subjective: Patient was able to focus a little bit today.  I was able to feed him some of his breakfast.  He was able to eat.  Offers no complaints.  No cough no shortness of breath.  Feels weak.  Objective: Vitals:   10/11/19 0805 10/11/19 1207  BP: 134/82 125/75  Pulse: 88 83  Resp: 18 18  Temp: 98.9 F (37.2 C) 99 F (37.2 C)  SpO2: 94% 97%    Intake/Output Summary (Last 24 hours) at 10/11/2019 1328 Last data filed at 10/11/2019 1200 Gross per 24 hour  Intake 156 ml  Output 1200 ml  Net -1044 ml   Filed Weights   10/07/19 1805  Weight: 99.8 kg    ROS: Review of Systems  Constitutional: Positive for malaise/fatigue.  Respiratory: Negative for cough and shortness of breath.   Cardiovascular: Negative for chest pain.   Exam: Physical Exam HENT:     Mouth/Throat:     Pharynx: No oropharyngeal exudate.  Eyes:     General: Lids are normal.     Conjunctiva/sclera: Conjunctivae normal.  Cardiovascular:     Rate and Rhythm: Normal rate and regular rhythm.     Heart sounds: Normal heart sounds, S1 normal and S2 normal.  Pulmonary:     Breath sounds: Examination of the right-lower field reveals decreased breath sounds. Examination of the left-lower field reveals decreased breath sounds. Decreased breath sounds present. No wheezing, rhonchi or rales.  Abdominal:     Palpations: Abdomen is soft.     Tenderness: There is no abdominal tenderness.  Musculoskeletal:     Right lower leg: Swelling present.     Left lower leg: Swelling present.  Skin:    General: Skin is warm.     Findings: No rash.  Neurological:     Mental Status: He is alert.     Comments: Able to follow some simple commands.  Answers some questions appropriately.       Data Reviewed: Basic  Metabolic Panel: Recent Labs  Lab 10/07/19 1817 10/08/19 0154 10/10/19 0406 10/11/19 0437  NA 136  --  138 140  K 3.6  --  3.3* 3.3*  CL 100  --  99 99  CO2 23  --  27 30  GLUCOSE 105*  --  110* 103*  BUN 22  --  24* 28*  CREATININE 0.90 1.02 0.84 0.71  CALCIUM 8.3*  --  8.5* 8.6*   Liver Function Tests: Recent Labs  Lab 10/10/19 0406 10/11/19 0437  AST 53* 52*  ALT 8 8  ALKPHOS 55 50  BILITOT 1.8* 1.9*  PROT 7.3 7.3  ALBUMIN 3.5 3.4*   CBC: Recent Labs  Lab 10/07/19 1817 10/08/19 0154  WBC 5.1 4.5  HGB 13.0 12.5*  HCT 36.5* 36.1*  MCV 85.7 86.2  PLT 151 144*   BNP (last 3 results) Recent Labs    10/08/19 0154  BNP 27.8     Recent Results (from the past 240 hour(s))  Urine Culture     Status: Abnormal   Collection Time: 10/04/19  7:32 PM   Specimen: Urine, Random  Result Value Ref Range Status   Specimen Description   Final    URINE, RANDOM Performed at [facility], [address], [place], [place] [address]  Special Requests   Final    NONE Performed at [facility], [address] [place], [place] [address]    Culture MULTIPLE SPECIES PRESENT, SUGGEST RECOLLECTION (A)  Final   Report Status 10/05/2019 FINAL  Final  Respiratory Panel by RT PCR (Flu A&B, Covid) - Nasopharyngeal Swab     Status: Abnormal   Collection Time: 10/04/19  9:02 PM   Specimen: Nasopharyngeal Swab  Result Value Ref Range Status   SARS Coronavirus 2 by RT PCR POSITIVE (A) NEGATIVE Final    Comment: RESULT CALLED TO, READ BACK BY AND VERIFIED WITH: NIESHA WITTED @ 2202 10/04/2019 RH    Influenza A by PCR NEGATIVE NEGATIVE Final   Influenza B by PCR NEGATIVE NEGATIVE Final    Comment: (NOTE) The Xpert Xpress SARS-CoV-2/FLU/RSV assay is intended as an aid in  the diagnosis of influenza from Nasopharyngeal swab specimens and  should not be used as a sole basis for treatment. Nasal washings and  aspirates are unacceptable for Xpert Xpress  SARS-CoV-2/FLU/RSV  testing.  Fact Sheet for Patients: PinkCheek.be  Fact Sheet for Healthcare Providers: GravelBags.it  This test is not yet approved or cleared by the [place] FDA and  has been authorized for detection and/or diagnosis of SARS-CoV-2 by  FDA under an Emergency Use Authorization (EUA). This EUA will remain  in effect (meaning this test can be used) for the duration of the  Covid-19 declaration under Section 564(b)(1) of the Act, 21  U.S.C. section 360bbb-3(b)(1), unless the authorization is  terminated or revoked. Performed at [facility], [address] [place], [place] [address]   Urine Culture     Status: None   Collection Time: 10/08/19  7:09 AM   Specimen: Urine, Random  Result Value Ref Range Status   Specimen Description   Final    URINE, RANDOM Performed at [facility], [address], [place], [place] [address]    Special Requests   Final    NONE Performed at [facility], [address], [place], [place] [address]    Culture   Final    NO GROWTH Performed at [facility], [place] [address] [place], [place] [address]    Report Status 10/09/2019 FINAL  Final     Scheduled Meds: . amLODipine  5 mg Oral Daily  . vitamin C  500 mg Oral Daily  . carbidopa-levodopa  1 tablet Oral TID WC & HS  . Chlorhexidine Gluconate Cloth  6 each Topical Daily  . cholecalciferol  1,000 Units Oral Daily  . enoxaparin (LOVENOX) injection  40 mg Subcutaneous Q24H  . furosemide  20 mg Oral QODAY  . mouth rinse  15 mL Mouth Rinse BID  . melatonin  5 mg Oral QHS  . multivitamin with minerals  1 tablet Oral Daily  . pantoprazole  40 mg Oral Daily  . potassium chloride  40 mEq Oral Once  . traZODone  50 mg Oral QHS  . zinc sulfate  220 mg Oral Daily   Continuous Infusions: . sodium chloride Stopped (10/11/19 0810)  . remdesivir 100 mg in NS 100 mL Stopped (10/11/19  0959)    Assessment/Plan:  1. Acute delirium with underlying Lewy body dementia.  Mental status better this morning and able to focus and follow some simple commands.  Continue melatonin and trazodone at night.  Do not give any steroids.  As needed risperidone during the day and will use as needed Zyprexa at night. 2.  COVID-19 infection.  Remdesivir day 4 today.  Hold off on steroids since not hypoxic and chest x-ray does not show pneumonia. 3. Weakness.  Physical therapy recommends rehab 4. Essential hypertension on Norvasc 5. GERD on PPI 6. Stage I pressure injury skin blister.  Present on admission.  See description below  Pressure Injury 10/08/19 Buttocks Left;Upper Deep Tissue Pressure Injury - Purple or maroon localized area of discolored intact skin or blood-filled blister due to damage of underlying soft tissue from pressure and/or shear. (Active)  10/08/19 0325  Location: Buttocks  Location Orientation: Left;Upper  Staging: Deep Tissue Pressure Injury - Purple or maroon localized area of discolored intact skin or blood-filled blister due to damage of underlying soft tissue from pressure and/or shear.  Wound Description (Comments):   Present on Admission: Yes       Code Status:     Code Status Orders  (From admission, onward)         Start     Ordered   10/08/19 0215  Full code  Continuous        10/08/19 0216        Code Status History    Date Active Date Inactive Code Status Order ID Comments User Context   11/06/2018 1728 11/07/2018 1522 Full Code [id number]  [name], MD Inpatient   09/09/2018 0417 09/11/2018 2037 Full Code [id number]  Vaughan Basta, MD ED   Advance Care Planning Activity     Family Communication: Spoke with daughter on the phone Disposition Plan: Status is: Inpatient  Dispo: The patient is from: Home               Anticipated d/c is to: Rehab              Anticipated d/c date is: Potential 10/12/2019              Patient  currently receiving day 4 of remdesivir for COVID-19 infection.  Time spent: 27 minutes  [place]

## 2019-10-11 NOTE — Progress Notes (Signed)
Physical Therapy Treatment Patient Details Name: Randy [name] MRN: [id number] DOB: [birth year] Today's Date: 10/11/2019    History of Present Illness Randy [name] is a 85 y.o. male with medical history significant for BPH, DJD and DJD with history of recurrent falls, GERD, unvaccinated, diagnosed with COVID-19 on 9/27 received monoclonal antibodies on 9/29, brought in because of generalized weakness requiring personal assistance for ambulation with family's inability to care for patient.    PT Comments    Pt bed asleep.  Awakens with mod verbal and tactile cues.  Has not eaten lunch and declines beng hungry.  He does agree to supine ex in attempts to awaken for mobility.  Does little to assist and when attempts at RLE he yells out "Put my leg down!"  Further attempts at mobility deferred for pt and staff safety given lethargy and non -participation with basic ex's. Keeps eyes closed through most of session.   Follow Up Recommendations  SNF     Equipment Recommendations       Recommendations for Other Services       Precautions / Restrictions Precautions Precautions: Fall Restrictions Weight Bearing Restrictions: No    Mobility  Bed Mobility                  Transfers                    Ambulation/Gait                 Stairs             Wheelchair Mobility    Modified Rankin (Stroke Patients Only)       Balance                                            Cognition Arousal/Alertness: Lethargic Behavior During Therapy: Agitated Overall Cognitive Status: Impaired/Different from baseline                                 General Comments: becomes agitated with attempts at activity      Exercises      General Comments        Pertinent Vitals/Pain Pain Assessment: Faces Faces Pain Scale: Hurts even more Pain Location: R LE Pain Descriptors / Indicators: Sore;Guarding Pain Intervention(s): Limited  activity within patient's tolerance;Monitored during session    Home Living                      Prior Function            PT Goals (current goals can now be found in the care plan section) Progress towards PT goals: Not progressing toward goals - comment    Frequency    Min 2X/week      PT Plan Current plan remains appropriate    Co-evaluation              AM-PAC PT "6 Clicks" Mobility   Outcome Measure  Help needed turning from your back to your side while in a flat bed without using bedrails?: Total Help needed moving from lying on your back to sitting on the side of a flat bed without using bedrails?: Total Help needed moving to and from a bed to a chair (including a wheelchair)?: Total Help  needed standing up from a chair using your arms (e.g., wheelchair or bedside chair)?: Total Help needed to walk in hospital room?: Total Help needed climbing 3-5 steps with a railing? : Total 6 Click Score: 6    End of Session   Activity Tolerance: Patient limited by fatigue Patient left: in bed;with bed alarm set;with call bell/phone within reach Nurse Communication: Mobility status       Time: 1410-1420 PT Time Calculation (min) (ACUTE ONLY): 10 min  Charges:  $Therapeutic Activity: 8-22 mins                    Chesley Noon, PTA 10/11/19, 3:04 PM

## 2019-10-11 NOTE — TOC Progression Note (Signed)
Transition of Care [facility]) - Progression Note    Patient Details  Name: Randy [name] MRN: [id number] Date of Birth: 1934/05/07  [facility] CM/SW Contact  Shelbie Hutching, RN Phone Number: 10/11/2019, 11:03 AM  Clinical Narrative:    [name] has offered a bed and can accept patient tomorrow.     Expected Discharge Plan: Skilled Nursing Facility Barriers to Discharge: Continued Medical Work up  Expected Discharge Plan and Services Expected Discharge Plan: [place]   Discharge Planning Services: CM Consult Post Acute Care Choice: [place] arrangements for the past 2 months: Single Family Home                   DME Agency: NA       HH Arranged: NA           Social Determinants of Health (SDOH) Interventions    Readmission Risk Interventions Readmission Risk Prevention Plan 10/11/2019  Transportation Screening Complete  PCP or Specialist Appt within 3-5 Days Complete  HRI or Home Care Consult Complete  Social Work Consult for [place] Planning/Counseling Complete  Palliative Care Screening Not Applicable  Medication Review Press photographer) Complete  Some recent data might be hidden

## 2019-10-11 NOTE — Patient Instructions (Signed)
Visit Information  Goals Addressed            This Visit's Progress   . RNCM: Recently Dad has had swelling in both of his legs and feet       Current Barriers:  [name] Knowledge Deficits related to management of HTN as evidence of swelling in feet and legs, not always being able to eat a heart healthy diet . Cognitive Deficits  Nurse Case Manager Clinical Goal(s):  [name] Over the next 90 days, patient will verbalize understanding of plan for managing HTN and HLD . Over the next 90 days, patient will work with [facility] [place] pharmacist and pcp to address needs related to managing HTN and increased swelling in feet and legs . Over the next 90 days, patient will demonstrate a decrease in bilateral lower extremity edema exacerbations as evidenced by normalized blood pressures and no evidence of edema present in bilateral lower legs . Over the next 90 days, patient will demonstrate improved adherence to prescribed treatment plan for wearing knee high ted hose as evidenced bydecreased bilateral lower leg edema . Over the next 90 days, patient will work with CM team pharmacist to review medications as needed and take Lasix as prescribed by pcp . Over the next 90 days, the patient will demonstrate ongoing self health care management ability as evidenced by elevating legs when sitting, taking meds as prescribed, following a heart healthy diet, and wearing ted hose to reduce bilateral lower leg edema.  Interventions:  . Evaluation of current treatment plan related to HTN and HLD and patient's adherence to plan as established by provider. 10-04-2019: The daughter states that the patient is having shortness of breath, a cough, and edema in bilateral legs that is worse in his right foot. States the cough has not been productive. Evaluation if the patient has been around anyone that is COVID positive and the daughter states not that she is aware of. The patient has several issues going on with knee pain with limited  mobility and GI bleeding that is not better. The patient feels he needs to be evaluated at the hospital and after eating he has requested for his daughter to call EMS. Empathetic listening and support given. 10-11-2019: The patient is currently inpatient at [facility]. The patient will be going to "[facility]" likely tomorrow, per the daughter for rehabilitation.  The patient tested positive for COVID 19 and received the Immuglobin infusion on Wednesday. The patient was not getting better and was hospitalized on 10-08-2019 for weakness and UTI.  The patient also had confusion. Explained this was likely due to the UTI. The daughter had called and left a VM asking for the [facility] to help.  The whole family is COVID positive and she was concerned because she could not provide care for the patient. Empathetic listening and support given.  . Provided education to patient re: best way to put on ted hose with the use of a plastic grocery bag for ease of putting on hose. Also alternative compression socks" found on [place] or at [place] that my be beneficial for the patient. 10-04-2019: The patients daughter states that the patient is still having issues putting on the ted hose even with the ideas given to them with the grocery bags.  The daughter did get the patient some diabetic socks.He can put those on. Education given on elevation of legs when sitting and to try ace wraps, just make sure they are not too tight around his  legs. The patient is having worsening swelling today and also shortness of breath. The patients daughter was out of town this weekend but endorses compliance with medications. The patient has several health issues impacting his care right now. 10-11-2019: The daughter is happy the patient is going to rehab.  She is working with the case manager at the hospital to facilitate the patients care. Education on the patient following up with the pcp after his stay at rehab.  The daughter  verbalized the patient was very week.  Education and support given. Education on UTI also with the confusion being exacerbated by the UTI. The daughter now feels the patient was not having a GI bleed but blood in his urine. The patient is on antibiotics. Will continue to monitor and follow up accordingly.  . Reviewed medications with patient and discussed the use of Lasix. The daughter verbalized the patient did not have any Lasix. The patient has been started back on Lasix and per the daughter today the Lasix has been effective in helping with the swelling and she has noted decrease in swelling. 08-09-2019: the patient is taking lasix 20 mg QOD as prescribed. The patients daughter notes that this is helping with the swelling in his legs. . Discussed plans with patient for ongoing care management follow up and provided patient with direct contact information for care management team . Advised patient, providing education and rationale, to monitor blood pressure daily and record, calling pcp for findings outside established parameters.  . Provided patient with heart healthy  educational materials related to adhering to heart healthy diet  . Pharmacy referral for medication change follow up and support . Educated daughter on the [place] functionality to view notes/lab work and communicate with the pcp team  Patient Self Care Activities:  . Patient verbalizes understanding of plan to use Lasix prn for swelling in feet and legs per MD recommendations . Attends all scheduled provider appointments . Calls provider office for new concerns or questions . Unable to independently manage bilateral lower leg edema  Please see past updates related to this goal by clicking on the "Past Updates" button in the selected goal         Patient verbalizes understanding of instructions provided today.   Telephone follow up appointment with care management team member scheduled for: 11-08-2019 at [place] am  Noreene Larsson RN,  MSN, [place]: [phone]

## 2019-10-11 NOTE — NC FL2 (Signed)
[place] LEVEL OF CARE SCREENING TOOL     IDENTIFICATION  Patient Name: Randy [name] Birthdate: 15-Apr-1934 Sex: male Admission Date (Current Location): 10/08/2019  [place] and [place] Number:  Engineering geologist and Address:  [facility], [address], [place], [place] [address]      Provider Number: [id number]  Attending Physician Name and Address:  [name], MD  Relative Name and Phone Number:  [name]- [phone]    Current Level of Care: Hospital Recommended Level of Care: [place] Prior Approval Number:    Date Approved/Denied:   PASRR Number:    Discharge Plan: SNF    Current Diagnoses: Patient Active Problem List   Diagnosis Date Noted  . Pressure injury of buttock, stage 1   . Delirium   . COVID-19 virus infection 10/08/2019  . Unable to care for self 10/08/2019  . Caregiver unable to cope 10/08/2019  . Recurrent falls 10/08/2019  . Lewy body dementia without behavioral disturbance ([place])   . Prostate cancer ([place]) 06/30/2019  . BPH with urinary obstruction 11/06/2018  . Acute cholecystitis 09/08/2018  . Myalgia due to statin 02/24/2018  . Tricompartment osteoarthritis of left knee 11/14/2017  . Generalized weakness 04/28/2017  . Chronic low back pain 03/12/2017  . Knee pain, chronic 10/01/2016  . Chronic venous insufficiency 08/30/2016  . BPH without obstruction/lower urinary tract symptoms 03/06/2016  . Osteoarthritis of multiple joints 03/06/2016  . Degenerative joint disease (DJD) of lumbar spine 03/06/2016  . Essential hypertension 03/05/2016  . Hyperlipidemia 03/05/2016  . Osteoporosis 03/05/2016  . GERD (gastroesophageal reflux disease) 03/05/2016  . Bilateral lower extremity edema 03/05/2016    Orientation RESPIRATION BLADDER Height & Weight     Self  Normal External catheter Weight: 99.8 kg Height:  6' (182.9 cm)  BEHAVIORAL SYMPTOMS/MOOD NEUROLOGICAL BOWEL NUTRITION  STATUS     (Dementia) Continent Diet (heart healthy)  AMBULATORY STATUS COMMUNICATION OF NEEDS Skin   Extensive Assist Verbally PU Stage and Appropriate Care (Deep pressure injury to Buttocks)                       Personal Care Assistance Level of Assistance  Bathing, Feeding, Dressing Bathing Assistance: Maximum assistance Feeding assistance: Limited assistance Dressing Assistance: Maximum assistance     Functional Limitations Info  Sight Sight Info: Impaired        SPECIAL CARE FACTORS FREQUENCY  PT (By licensed PT), OT (By licensed OT)     PT Frequency: 5 times per week OT Frequency: 5 times per week            Contractures Contractures Info: Not present    Additional Factors Info  Code Status, Allergies, Isolation Precautions Code Status Info: Full Allergies Info: Antihistamines, chlorpheniramine     Isolation Precautions Info: COVID- airborne     Current Medications (10/11/2019):  This is the current hospital active medication list Current Facility-Administered Medications  Medication Dose Route Frequency Provider Last Rate Last Admin  . 0.9 %  sodium chloride infusion   Intravenous PRN [name], MD 5 mL/hr at 10/11/19 0804 250 mL at 10/11/19 0804  . acetaminophen (TYLENOL) tablet 650 mg  650 mg Oral Q6H PRN Athena Masse, MD   650 mg at 10/11/19 0810  . amLODipine (NORVASC) tablet 5 mg  5 mg Oral Daily [name], MD   5 mg at 10/11/19 0810  . ascorbic acid (VITAMIN C) tablet 500 mg  500  mg Oral Daily Athena Masse, MD   500 mg at 10/11/19 0810  . carbidopa-levodopa (SINEMET IR) 25-100 MG per tablet immediate release 1 tablet  1 tablet Oral TID WC & HS [name], MD   1 tablet at 10/10/19 2216  . Chlorhexidine Gluconate Cloth 2 % PADS 6 each  6 each Topical Daily [name], MD   6 each at 10/10/19 1110  . cholecalciferol (VITAMIN D3) tablet 1,000 Units  1,000 Units Oral Daily [name], MD   1,000 Units at 10/11/19  0810  . diclofenac Sodium (VOLTAREN) 1 % topical gel 2 g  2 g Topical QID PRN Wieting, Richard, MD      . enoxaparin (LOVENOX) injection 40 mg  40 mg Subcutaneous Q24H Athena Masse, MD   40 mg at 10/10/19 2216  . fluticasone (FLONASE) 50 MCG/ACT nasal spray 2 spray  2 spray Each Nare Daily PRN Wieting, Richard, MD      . furosemide (LASIX) tablet 20 mg  20 mg Oral Gertie Gowda, Richard, MD   20 mg at 10/10/19 1107  . MEDLINE mouth rinse  15 mL Mouth Rinse BID [name], MD   15 mL at 10/10/19 2216  . melatonin tablet 5 mg  5 mg Oral QHS [name], MD   5 mg at 10/10/19 2216  . multivitamin with minerals tablet 1 tablet  1 tablet Oral Daily Athena Masse, MD   1 tablet at 10/11/19 0810  . ondansetron (ZOFRAN) tablet 4 mg  4 mg Oral Q6H PRN Athena Masse, MD       Or  . ondansetron [facility]) injection 4 mg  4 mg Intravenous Q6H PRN Athena Masse, MD      . pantoprazole (PROTONIX) EC tablet 40 mg  40 mg Oral Daily [name], MD   40 mg at 10/11/19 0810  . remdesivir 100 mg in sodium chloride 0.9 % 100 mL IVPB  100 mg Intravenous Daily Lu Duffel, RPH 200 mL/hr at 10/11/19 0810 100 mg at 10/11/19 0810  . risperiDONE (RISPERDAL) tablet 0.5 mg  0.5 mg Oral BID PRN [name], MD      . traZODone (DESYREL) tablet 50 mg  50 mg Oral QHS [name], MD   50 mg at 10/10/19 2216  . zinc sulfate capsule 220 mg  220 mg Oral Daily Athena Masse, MD   220 mg at 10/10/19 1106     Discharge Medications: Please see discharge summary for a list of discharge medications.  Relevant Imaging Results:  Relevant Lab Results:   Additional Information SS# [id number]  Shelbie Hutching, RN

## 2019-10-12 ENCOUNTER — Inpatient Hospital Stay: Payer: Medicare Other

## 2019-10-12 LAB — COMPREHENSIVE METABOLIC PANEL
ALT: 9 U/L (ref 0–44)
AST: 45 U/L — ABNORMAL HIGH (ref 15–41)
Albumin: 3.5 g/dL (ref 3.5–5.0)
Alkaline Phosphatase: 56 U/L (ref 38–126)
Anion gap: 12 (ref 5–15)
BUN: 34 mg/dL — ABNORMAL HIGH (ref 8–23)
CO2: 27 mmol/L (ref 22–32)
Calcium: 9.1 mg/dL (ref 8.9–10.3)
Chloride: 102 mmol/L (ref 98–111)
Creatinine, Ser: 0.83 mg/dL (ref 0.61–1.24)
GFR calc Af Amer: >60 mL/min (ref >60)
GFR calc non Af Amer: >60 mL/min (ref >60)
Glucose, Bld: 115 mg/dL — ABNORMAL HIGH (ref 70–99)
Potassium: 4 mmol/L (ref 3.5–5.1)
Sodium: 141 mmol/L (ref 135–145)
Total Bilirubin: 1.9 mg/dL — ABNORMAL HIGH (ref 0.3–1.2)
Total Protein: 7.4 g/dL (ref 6.5–8.1)

## 2019-10-12 LAB — FIBRIN DERIVATIVES D-DIMER (ARMC ONLY): Fibrin derivatives D-dimer (ARMC): 880.4 ng/mL (FEU) — ABNORMAL HIGH (ref 0.00–499.00)

## 2019-10-12 LAB — C-REACTIVE PROTEIN: CRP: 6 mg/dL — ABNORMAL HIGH (ref <1.0)

## 2019-10-12 LAB — FERRITIN: Ferritin: 197 ng/mL (ref 24–336)

## 2019-10-12 MED ORDER — AMLODIPINE BESYLATE 5 MG PO TABS: 5.0000 mg | ORAL_TABLET | Freq: Every day | ORAL | 0 refills | Status: DC

## 2019-10-12 MED ORDER — ZINC SULFATE 220 (50 ZN) MG PO CAPS
220.0000 mg | ORAL_CAPSULE | Freq: Every day | ORAL | 0 refills | Status: DC
Start: 2019-10-12 — End: 2021-07-31

## 2019-10-12 MED ORDER — ADULT MULTIVITAMIN W/MINERALS CH: 1.0000 | ORAL_TABLET | Freq: Every day | ORAL | 0 refills | Status: DC

## 2019-10-12 MED ORDER — DOCUSATE SODIUM 100 MG PO CAPS
100.0000 mg | ORAL_CAPSULE | Freq: Two times a day (BID) | ORAL | Status: DC
  Administered 2019-10-12: 09:00:00 100 mg via ORAL
  Filled 2019-10-12: qty 1

## 2019-10-12 MED ORDER — CARBIDOPA-LEVODOPA 25-100 MG PO TABS: 1.0000 | ORAL_TABLET | Freq: Three times a day (TID) | ORAL | 0 refills | Status: DC

## 2019-10-12 MED ORDER — OLANZAPINE 5 MG PO TABS
5.0000 mg | ORAL_TABLET | Freq: Every evening | ORAL | 0 refills | Status: DC | PRN
Start: 2019-10-12 — End: 2019-11-10

## 2019-10-12 MED ORDER — RISPERIDONE 0.5 MG PO TABS
0.5000 mg | ORAL_TABLET | Freq: Two times a day (BID) | ORAL | 0 refills | Status: DC | PRN
Start: 2019-10-12 — End: 2019-11-10

## 2019-10-12 MED ORDER — TRAZODONE HCL 50 MG PO TABS
50.0000 mg | ORAL_TABLET | Freq: Every day | ORAL | 0 refills | Status: DC
Start: 2019-10-12 — End: 2019-11-10

## 2019-10-12 MED ORDER — ASPIRIN EC 81 MG PO TBEC: 81.0000 mg | DELAYED_RELEASE_TABLET | Freq: Every day | ORAL | 0 refills | Status: AC

## 2019-10-12 MED ORDER — FLEET ENEMA 7-19 GM/118ML RE ENEM
1.0000 | ENEMA | Freq: Every day | RECTAL | Status: DC | PRN
  Administered 2019-10-12: 14:00:00 1 via RECTAL

## 2019-10-12 MED ORDER — DOCUSATE SODIUM 100 MG PO CAPS: 100.0000 mg | ORAL_CAPSULE | Freq: Two times a day (BID) | ORAL | 0 refills | Status: DC

## 2019-10-12 MED ORDER — BISACODYL 10 MG RE SUPP
10.0000 mg | Freq: Once | RECTAL | Status: AC
  Administered 2019-10-12: 08:00:00 10 mg via RECTAL
  Filled 2019-10-12: qty 1

## 2019-10-12 NOTE — TOC Progression Note (Signed)
Transition of Care [facility]) - Progression Note    Patient Details  Name: Randy [name] MRN: [id number] Date of Birth: Apr 29, 1934  [facility] CM/SW Contact  Shelbie Hutching, RN Phone Number: 10/12/2019, 9:47 AM  Clinical Narrative:    Insurance authorization received for SNF, [place] ID [id number], navi ID [id number] approved for 3 days start 10/4 next review 10/6.  Potential discharge today patient needs head CT before discharge.    Expected Discharge Plan: Skilled Nursing Facility Barriers to Discharge: Continued Medical Work up  Expected Discharge Plan and Services Expected Discharge Plan: [place]   Discharge Planning Services: CM Consult Post Acute Care Choice: [place] arrangements for the past 2 months: Single Family Home                   DME Agency: NA       HH Arranged: NA           Social Determinants of Health (SDOH) Interventions    Readmission Risk Interventions Readmission Risk Prevention Plan 10/11/2019  Transportation Screening Complete  PCP or Specialist Appt within 3-5 Days Complete  HRI or Home Care Consult Complete  Social Work Consult for [place] Planning/Counseling Complete  Palliative Care Screening Not Applicable  Medication Review Press photographer) Complete  Some recent data might be hidden

## 2019-10-12 NOTE — Progress Notes (Signed)
Uneventful night. Pt was noted to be confused and oriented only to self during the shift. Pt slept throughout most of the shift with no disturbances. Pt is currently lying in bed asleep at this time. Equal rise and fall of the chest is present. No obvious distress is noted that requires medical interventions.

## 2019-10-12 NOTE — Discharge Instructions (Signed)
COVID-19 COVID-19 is a respiratory infection that is caused by a virus called severe acute respiratory syndrome coronavirus 2 (SARS-CoV-2). The disease is also known as coronavirus disease or novel coronavirus. In some people, the virus may not cause any symptoms. In others, it may cause a serious infection. The infection can get worse quickly and can lead to complications, such as:  Pneumonia, or infection of the lungs.  Acute respiratory distress syndrome or ARDS. This is a condition in which fluid build-up in the lungs prevents the lungs from filling with air and passing oxygen into the blood.  Acute respiratory failure. This is a condition in which there is not enough oxygen passing from the lungs to the body or when carbon dioxide is not passing from the lungs out of the body.  Sepsis or septic shock. This is a serious bodily reaction to an infection.  Blood clotting problems.  Secondary infections due to bacteria or fungus.  Organ failure. This is when your body's organs stop working. The virus that causes COVID-19 is contagious. This means that it can spread from person to person through droplets from coughs and sneezes (respiratory secretions). What are the causes? This illness is caused by a virus. You may catch the virus by:  Breathing in droplets from an infected person. Droplets can be spread by a person breathing, speaking, singing, coughing, or sneezing.  Touching something, like a table or a doorknob, that was exposed to the virus (contaminated) and then touching your mouth, nose, or eyes. What increases the risk? Risk for infection You are more likely to be infected with this virus if you:  Are within 6 feet (2 meters) of a person with COVID-19.  Provide care for or live with a person who is infected with COVID-19.  Spend time in crowded indoor spaces or live in shared housing. Risk for serious illness You are more likely to become seriously ill from the virus if you:   Are 50 years of age or older. The higher your age, the more you are at risk for serious illness.  Live in a nursing home or long-term care facility.  Have cancer.  Have a long-term (chronic) disease such as: ? Chronic lung disease, including chronic obstructive pulmonary disease or asthma. ? A long-term disease that lowers your body's ability to fight infection (immunocompromised). ? Heart disease, including heart failure, a condition in which the arteries that lead to the heart become narrow or blocked (coronary artery disease), a disease which makes the heart muscle thick, weak, or stiff (cardiomyopathy). ? Diabetes. ? Chronic kidney disease. ? Sickle cell disease, a condition in which red blood cells have an abnormal "sickle" shape. ? Liver disease.  Are obese. What are the signs or symptoms? Symptoms of this condition can range from mild to severe. Symptoms may appear any time from 2 to 14 days after being exposed to the virus. They include:  A fever or chills.  A cough.  Difficulty breathing.  Headaches, body aches, or muscle aches.  Runny or stuffy (congested) nose.  A sore throat.  New loss of taste or smell. Some people may also have stomach problems, such as nausea, vomiting, or diarrhea. Other people may not have any symptoms of COVID-19. How is this diagnosed? This condition may be diagnosed based on:  Your signs and symptoms, especially if: ? You live in an area with a COVID-19 outbreak. ? You recently traveled to or from an area where the virus is common. ? You   provide care for or live with a person who was diagnosed with COVID-19. ? You were exposed to a person who was diagnosed with COVID-19.  A physical exam.  Lab tests, which may include: ? Taking a sample of fluid from the back of your nose and throat (nasopharyngeal fluid), your nose, or your throat using a swab. ? A sample of mucus from your lungs (sputum). ? Blood tests.  Imaging tests, which  may include, X-rays, CT scan, or ultrasound. How is this treated? At present, there is no medicine to treat COVID-19. Medicines that treat other diseases are being used on a trial basis to see if they are effective against COVID-19. Your health care provider will talk with you about ways to treat your symptoms. For most people, the infection is mild and can be managed at home with rest, fluids, and over-the-counter medicines. Treatment for a serious infection usually takes places in a hospital intensive care unit (ICU). It may include one or more of the following treatments. These treatments are given until your symptoms improve.  Receiving fluids and medicines through an IV.  Supplemental oxygen. Extra oxygen is given through a tube in the nose, a face mask, or a hood.  Positioning you to lie on your stomach (prone position). This makes it easier for oxygen to get into the lungs.  Continuous positive airway pressure (CPAP) or bi-level positive airway pressure (BPAP) machine. This treatment uses mild air pressure to keep the airways open. A tube that is connected to a motor delivers oxygen to the body.  Ventilator. This treatment moves air into and out of the lungs by using a tube that is placed in your windpipe.  Tracheostomy. This is a procedure to create a hole in the neck so that a breathing tube can be inserted.  Extracorporeal membrane oxygenation (ECMO). This procedure gives the lungs a chance to recover by taking over the functions of the heart and lungs. It supplies oxygen to the body and removes carbon dioxide. Follow these instructions at home: Lifestyle  If you are sick, stay home except to get medical care. Your health care provider will tell you how long to stay home. Call your health care provider before you go for medical care.  Rest at home as told by your health care provider.  Do not use any products that contain nicotine or tobacco, such as cigarettes, e-cigarettes, and  chewing tobacco. If you need help quitting, ask your health care provider.  Return to your normal activities as told by your health care provider. Ask your health care provider what activities are safe for you. General instructions  Take over-the-counter and prescription medicines only as told by your health care provider.  Drink enough fluid to keep your urine pale yellow.  Keep all follow-up visits as told by your health care provider. This is important. How is this prevented?  There is no vaccine to help prevent COVID-19 infection. However, there are steps you can take to protect yourself and others from this virus. To protect yourself:   Do not travel to areas where COVID-19 is a risk. The areas where COVID-19 is reported change often. To identify high-risk areas and travel restrictions, check the CDC travel website: wwwnc.cdc.gov/travel/notices  If you live in, or must travel to, an area where COVID-19 is a risk, take precautions to avoid infection. ? Stay away from people who are sick. ? Wash your hands often with soap and water for 20 seconds. If soap and water   are not available, use an alcohol-based hand sanitizer. ? Avoid touching your mouth, face, eyes, or nose. ? Avoid going out in public, follow guidance from your state and local health authorities. ? If you must go out in public, wear a cloth face covering or face mask. Make sure your mask covers your nose and mouth. ? Avoid crowded indoor spaces. Stay at least 6 feet (2 meters) away from others. ? Disinfect objects and surfaces that are frequently touched every day. This may include:  Counters and tables.  Doorknobs and light switches.  Sinks and faucets.  Electronics, such as phones, remote controls, keyboards, computers, and tablets. To protect others: If you have symptoms of COVID-19, take steps to prevent the virus from spreading to others.  If you think you have a COVID-19 infection, contact your health care  provider right away. Tell your health care team that you think you may have a COVID-19 infection.  Stay home. Leave your house only to seek medical care. Do not use public transport.  Do not travel while you are sick.  Wash your hands often with soap and water for 20 seconds. If soap and water are not available, use alcohol-based hand sanitizer.  Stay away from other members of your household. Let healthy household members care for children and pets, if possible. If you have to care for children or pets, wash your hands often and wear a mask. If possible, stay in your own room, separate from others. Use a different bathroom.  Make sure that all people in your household wash their hands well and often.  Cough or sneeze into a tissue or your sleeve or elbow. Do not cough or sneeze into your hand or into the air.  Wear a cloth face covering or face mask. Make sure your mask covers your nose and mouth. Where to find more information  Centers for Disease Control and Prevention: www.cdc.gov/coronavirus/2019-ncov/index.html  World Health Organization: www.who.int/health-topics/coronavirus Contact a health care provider if:  You live in or have traveled to an area where COVID-19 is a risk and you have symptoms of the infection.  You have had contact with someone who has COVID-19 and you have symptoms of the infection. Get help right away if:  You have trouble breathing.  You have pain or pressure in your chest.  You have confusion.  You have bluish lips and fingernails.  You have difficulty waking from sleep.  You have symptoms that get worse. These symptoms may represent a serious problem that is an emergency. Do not wait to see if the symptoms will go away. Get medical help right away. Call your local emergency services (911 in the [place] Do not drive yourself to the hospital. Let the emergency medical personnel know if you think you have COVID-19. Summary  COVID-19 is a  respiratory infection that is caused by a virus. It is also known as coronavirus disease or novel coronavirus. It can cause serious infections, such as pneumonia, acute respiratory distress syndrome, acute respiratory failure, or sepsis.  The virus that causes COVID-19 is contagious. This means that it can spread from person to person through droplets from breathing, speaking, singing, coughing, or sneezing.  You are more likely to develop a serious illness if you are 50 years of age or older, have a weak immune system, live in a nursing home, or have chronic disease.  There is no medicine to treat COVID-19. Your health care provider will talk with you about ways to treat your symptoms.    Take steps to protect yourself and others from infection. Wash your hands often and disinfect objects and surfaces that are frequently touched every day. Stay away from people who are sick and wear a mask if you are sick. This information is not intended to replace advice given to you by your health care provider. Make sure you discuss any questions you have with your health care provider. Document Revised: 10/23/2018 Document Reviewed: 01/29/2018 Elsevier Patient Education  2020 Elsevier Inc.  

## 2019-10-12 NOTE — Discharge Summary (Signed)
[facility] [place] NAME: Randy [name]    MR#:  [id number]  DATE OF BIRTH:  1934-07-05  DATE OF ADMISSION:  10/08/2019 ADMITTING PHYSICIAN: Athena Masse, MD  DATE OF DISCHARGE: 10/12/2019  PRIMARY CARE PHYSICIAN: [name], DO    ADMISSION DIAGNOSIS:  Generalized weakness [R53.1] COVID-19 [U07.1]  DISCHARGE DIAGNOSIS:  Principal Problem:   Generalized weakness Active Problems:   Essential hypertension   GERD (gastroesophageal reflux disease)   BPH without obstruction/lower urinary tract symptoms   Degenerative joint disease (DJD) of lumbar spine   COVID-19 virus infection   Unable to care for self   Caregiver unable to cope   Recurrent falls   Lewy body dementia without behavioral disturbance (HCC)   Delirium   Pressure injury of buttock, stage 1   SECONDARY DIAGNOSIS:   Past Medical History:  Diagnosis Date  . Arthritis   . GERD (gastroesophageal reflux disease)   . Hyperlipidemia   . Hypertension   . Lewy body dementia ([place])   . Osteoporosis   . Prostate enlargement     HOSPITAL COURSE:   1.  Acute delirium with underlying Lewy body dementia.  Patient was able to talk with the patient's daughter on the phone today.  This is better today.  He was able to answer questions and follow some simple commands.  Continue melatonin and trazodone at night.  Patient slept last night.  Do not give steroids for the Covid infection.  Try to keep him awake during the day, so he sleeps at night..  Only as needed medications risperidone during the day and Zyprexa at night if he gets agitated.  Otherwise do not use these medications.  Daughter was concerned that his speech may have been a little bit off and his CAT scan of the head on 10 05/2019 did not show any acute stroke.  We will give aspirin 81 mg daily 2.  COVID-19 infection.  Remdesivir day 5 today.  Hold off on steroids since not hypoxic and chest x-ray does not show  any pneumonia. 3.  Weakness.  Physical therapy recommends rehab. 4.  Essential hypertension on Norvasc 5.  GERD on PPI 6.  Stage I pressure injury skin blister present on admission.  Maroon in color with blister.  DISCHARGE CONDITIONS:   Fair  CONSULTS OBTAINED:  None  DRUG ALLERGIES:   Allergies  Allergen Reactions  . Antihistamines, Chlorpheniramine-Type Other (See Comments)    Can't urinate    DISCHARGE MEDICATIONS:   Allergies as of 10/12/2019      Reactions   Antihistamines, Chlorpheniramine-type Other (See Comments)   Can't urinate      Medication List    STOP taking these medications   enalapril 20 MG tablet Commonly known as: VASOTEC   levofloxacin 750 MG tablet Commonly known as: Levaquin     TAKE these medications   amLODipine 5 MG tablet Commonly known as: NORVASC Take 1 tablet (5 mg total) by mouth daily. Start taking on: October 13, 2019   aspirin EC 81 MG tablet Take 1 tablet (81 mg total) by mouth daily. Swallow whole.   carbidopa-levodopa 25-100 MG tablet Commonly known as: SINEMET IR Take 1 tablet by mouth 4 (four) times daily -  with meals and at bedtime. What changed: when to take this   cholecalciferol 1000 units tablet Commonly known as: VITAMIN D Take 1,000 Units by mouth daily.   Co Q 10 100 MG Caps Take 100  mg by mouth daily.   diclofenac Sodium 1 % Gel Commonly known as: VOLTAREN Apply 2 g topically 4 (four) times daily as needed. To knee arthritis pain   docusate sodium 100 MG capsule Commonly known as: COLACE Take 1 capsule (100 mg total) by mouth 2 (two) times daily.   Fish Oil 1200 MG Caps Take 1,200 mg by mouth 2 (two) times daily.   fluticasone 50 MCG/ACT nasal spray Commonly known as: FLONASE Place 2 sprays into both nostrils daily as needed for allergies.   furosemide 20 MG tablet Commonly known as: LASIX Take 1 tablet (20 mg total) by mouth every other day.   melatonin 5 MG Tabs Take 5 mg by mouth at  bedtime as needed (sleep).   multivitamin with minerals Tabs tablet Take 1 tablet by mouth daily.   OLANZapine 5 MG tablet Commonly known as: ZYPREXA Take 1 tablet (5 mg total) by mouth at bedtime as needed (agitation).   omeprazole 20 MG capsule Commonly known as: PRILOSEC Take 20 mg by mouth daily.   risperiDONE 0.5 MG tablet Commonly known as: RISPERDAL Take 1 tablet (0.5 mg total) by mouth 2 (two) times daily as needed (agitation).   traZODone 50 MG tablet Commonly known as: DESYREL Take 1 tablet (50 mg total) by mouth at bedtime.   vitamin C 500 MG tablet Commonly known as: ASCORBIC ACID Take 500 mg by mouth daily.   zinc sulfate 220 (50 Zn) MG capsule Take 1 capsule (220 mg total) by mouth daily.        DISCHARGE INSTRUCTIONS:   Follow-up team at rehab 1 day  If you experience worsening of your admission symptoms, develop shortness of breath, life threatening emergency, suicidal or homicidal thoughts you must seek medical attention immediately by calling 911 or calling your MD immediately  if symptoms less severe.  You Must read complete instructions/literature along with all the possible adverse reactions/side effects for all the Medicines you take and that have been prescribed to you. Take any new Medicines after you have completely understood and accept all the possible adverse reactions/side effects.   Please note  You were cared for by a hospitalist during your hospital stay. If you have any questions about your discharge medications or the care you received while you were in the hospital after you are discharged, you can call the unit and asked to speak with the hospitalist on call if the hospitalist that took care of you is not available. Once you are discharged, your primary care physician will handle any further medical issues. Please note that NO REFILLS for any discharge medications will be authorized once you are discharged, as it is imperative that you  return to your primary care physician (or establish a relationship with a primary care physician if you do not have one) for your aftercare needs so that they can reassess your need for medications and monitor your lab values.    Today   CHIEF COMPLAINT:   Chief Complaint  Patient presents with  . Fatigue    HISTORY OF PRESENT ILLNESS:  Randy [name]  is a 85 y.o. male came in with weakness.   VITAL SIGNS:  Blood pressure 136/84, pulse 97, temperature 98.5 F (36.9 C), temperature source Oral, resp. rate 18, height 6' (1.829 m), weight 99.8 kg, SpO2 94 %.   PHYSICAL EXAMINATION:  GENERAL:  85 y.o.-year-old patient lying in the bed with no acute distress.  EYES: Pupils equal, round, reactive to light and accommodation.  No scleral icterus.  HEENT: Head atraumatic, normocephalic. Oropharynx and nasopharynx clear.   LUNGS: Normal breath sounds bilaterally, no wheezing, rales,rhonchi or crepitation. No use of accessory muscles of respiration.  CARDIOVASCULAR: S1, S2 normal. No murmurs, rubs, or gallops.  ABDOMEN: Soft, non-tender.  EXTREMITIES: Trace pedal edema.  NEUROLOGIC: Patient able to lift his arms up off the bed and slightly able to lift his legs up off the bed.. Sensation intact. Gait not checked.  PSYCHIATRIC: The patient is alert and oriented x 3.  SKIN: No obvious rash, lesion, or ulcer.   DATA REVIEW:   CBC Recent Labs  Lab 10/08/19 0154  WBC 4.5  HGB 12.5*  HCT 36.1*  PLT 144*    Chemistries  Recent Labs  Lab 10/12/19 0416  NA 141  K 4.0  CL 102  CO2 27  GLUCOSE 115*  BUN 34*  CREATININE 0.83  CALCIUM 9.1  AST 45*  ALT 9  ALKPHOS 10  BILITOT 1.9*    Microbiology Results  Results for orders placed or performed during the hospital encounter of 10/08/19  Urine Culture     Status: None   Collection Time: 10/08/19  7:09 AM   Specimen: Urine, Random  Result Value Ref Range Status   Specimen Description   Final    URINE, RANDOM Performed at  [facility], [address], [place], [place] [address]    Special Requests   Final    NONE Performed at [facility], [address], [place], [place] [address]    Culture   Final    NO GROWTH Performed at [facility], [place] [address], [place], [place] [address]    Report Status 10/09/2019 FINAL  Final    RADIOLOGY:  CT HEAD WO CONTRAST  Result Date: 10/12/2019 CLINICAL DATA:  Confusion EXAM: CT HEAD WITHOUT CONTRAST TECHNIQUE: Contiguous axial images were obtained from the base of the skull through the vertex without intravenous contrast. COMPARISON:  09/08/2018 FINDINGS: Brain: No evidence of acute infarction, hemorrhage, hydrocephalus, extra-axial collection or mass lesion/mass effect. Scattered low-density changes within the periventricular and subcortical white matter compatible with chronic microvascular ischemic change. Mild diffuse cerebral volume loss. Vascular: No hyperdense vessel or unexpected calcification. Skull: Negative for acute calvarial fracture. Sinuses/Orbits: Slightly expansile left frontal sinus mucocele with thinning of the posterior sinus wall (series 7, image 13) remaining paranasal sinuses and mastoid air cells are clear. Orbital structures intact. Other: None. IMPRESSION: 1. No acute intracranial findings. 2. Chronic microvascular ischemic change and cerebral volume loss. 3. Slightly expansile left frontal sinus mucocele with thinning of the posterior sinus wall, similar in appearance to prior. A dedicated sinus CT could be obtained, as clinically warranted. Electronically Signed   By: Davina Poke D.O.   On: 10/12/2019 10:10     Management plans discussed with the patient, family and they are in agreement.  CODE STATUS:     Code Status Orders  (From admission, onward)         Start     Ordered   10/08/19 0215  Full code  Continuous        10/08/19 0216        Code Status History    Date Active Date Inactive Code Status  Order ID Comments User Context   11/06/2018 1728 11/07/2018 1522 Full Code [id number]  [name], MD Inpatient   09/09/2018 0417 09/11/2018 2037 Full Code [id number]  Vaughan Basta, MD ED   Advance Care Planning  Activity      TOTAL TIME TAKING CARE OF THIS PATIENT: 35 minutes.    [name] M.D on 10/12/2019 at 10:43 AM  Between 7am to 6pm - Pager - [phone]  After 6pm go to [email or web address] - password [email or web address]  [facility]  CC: Primary care physician; [name], DO

## 2019-10-12 NOTE — TOC Transition Note (Signed)
Transition of Care [facility]) - CM/SW Discharge Note   Patient Details  Name: Randy QUADROS MRN: [id number] Date of Birth: [birth year]  [facility] CM/SW Contact:  Shelbie Hutching, RN Phone Number: 10/12/2019, 3:03 PM   Clinical Narrative:    Transport arranged for 1630 with [facility], daughter updated on pickup time.    Final next level of care: [place] Barriers to Discharge: Barriers Resolved   Patient Goals and CMS Choice Patient states their goals for this hospitalization and ongoing recovery are:: Daugher wants the patient to get his strength back before coming home CMS Medicare.gov Compare Post Acute Care list provided to:: Patient Represenative (must comment) Choice offered to / list presented to : Adult Children  Discharge Placement PASRR number recieved: 10/11/19            Patient [facility] bed at: [facility] Patient to be transferred to facility by: [facility] Name of family member notified: [name] (daughter) Patient and family notified of of transfer: 10/12/19  Discharge Plan and Services   Discharge Planning Services: CM Consult Post Acute Care Choice: [place]            DME Agency: NA       HH Arranged: NA          Social Determinants of Health (SDOH) Interventions     Readmission Risk Interventions Readmission Risk Prevention Plan 10/11/2019  Transportation Screening Complete  PCP or Specialist Appt within 3-5 Days Complete  HRI or [place] Complete  Social Work Consult for [place] Planning/Counseling Complete  Palliative Care Screening Not Applicable  Medication Review [employer] photographer) Complete  Some recent data might be hidden

## 2019-10-12 NOTE — Progress Notes (Signed)
Called report to Billie Lade LPN at [facility]  [phone], she said patient would go into room 801A

## 2019-10-12 NOTE — TOC Transition Note (Addendum)
Transition of Care [facility]) - CM/SW Discharge Note   Patient Details  Name: Randy [name] MRN: [id number] Date of Birth: [birth year]  Transition of Care [facility]) CM/SW Contact:  Shelbie Hutching, RN Phone Number: 10/12/2019, 12:44 PM   Clinical Narrative:    Patient is medically cleared for discharge to SNF at [place] today once he has a bowel movement.  Patient will be going to room 801 A, bedside RN will call report to [phone].  Daughter, [name], notified of plan to discharge today.  Patient will transport via EMS.  RNCM will arrange transport once patient is ready.    Final next level of care: [place] Barriers to Discharge: Barriers Resolved   Patient Goals and CMS Choice Patient states their goals for this hospitalization and ongoing recovery are:: Daugher wants the patient to get his strength back before coming home CMS Medicare.gov Compare Post Acute Care list provided to:: Patient Represenative (must comment) Choice offered to / list presented to : Adult Children  Discharge Placement PASRR number recieved: 10/11/19            Patient [facility] bed at: [facility] Patient to be transferred to facility by: [facility] Name of family member notified: [name] (daughter) Patient and family notified of of transfer: 10/12/19  Discharge Plan and Services   Discharge Planning Services: CM Consult Post Acute Care Choice: [place]            DME Agency: NA       HH Arranged: NA          Social Determinants of Health (SDOH) Interventions     Readmission Risk Interventions Readmission Risk Prevention Plan 10/11/2019  Transportation Screening Complete  PCP or Specialist Appt within 3-5 Days Complete  HRI or [place] Complete  Social Work Consult for [place] Planning/Counseling Complete  Palliative Care Screening Not Applicable  Medication Review Press photographer) Complete  Some recent data might be hidden

## 2019-10-13 ENCOUNTER — Telehealth: Payer: Self-pay | Admitting: Family Medicine

## 2019-10-13 NOTE — Telephone Encounter (Signed)
   SF 10/13/2019   Name: BARD HAUPERT   MRN: [id number]   DOB: 19-Aug-1934   AGE: 85 y.o.   GENDER: male   PCP [name], DO.   Called patient's daughter [name] regarding referral for Mr. Bouillon. Left message for [name] to give Care Guide a call back. Will try to give patient a call back within a couple of days.     [place], Care Management Phone: [phone] Email: [email or web address]

## 2019-10-26 NOTE — Telephone Encounter (Signed)
   SF 10/26/2019   Name: Randy [name]   MRN: [id number]   DOB: 1934/05/02   AGE: 85 y.o.   GENDER: male   PCP [name], DO.    Spoke with patient's daughter, Ms. [name] today regarding referral for in home aid. [name] stated that currently her father is still in rehab and that he is doing well, but she is not sure when he will be released. Ms. [place] Crane stated that once he is released they will figure out what will work best for him, but she does know that he wants to stay at home and not go to a facility. Asked Ms. [place] Crane is she would like to receive a Futures trader of In Home Aides in [place]. Ms. Nevada Crane asked if the list can be emailed to her at [email or web address]. Care Guide e-mailed list to Ms. Hall. Received e-mail confirmation that Ms. Hall received and read the e-mail.  No additional needs at this time.   Closing referral pending any other needs of patient.  Read: SECURE: Resources for In [facility] [email or web address]> Sent: Tue 10/26/2019 4:33 PM   Hi Ms. [place] Crane,   Thank you for speaking with me today regarding community resources. Attached you will find a list of In Home Aide and [place] for [facility]. If you have any additional needs please feel free to give the office a call.   Thanks,    [place], Care Management Phone: [phone] Email: [email or web address]   [place], Care Management Phone: [phone] Email: [email or web address]

## 2019-11-01 ENCOUNTER — Ambulatory Visit: Payer: Medicare Other | Admitting: Pharmacist

## 2019-11-01 DIAGNOSIS — I10 Essential (primary) hypertension: Secondary | ICD-10-CM | POA: Diagnosis not present

## 2019-11-01 DIAGNOSIS — E782 Mixed hyperlipidemia: Secondary | ICD-10-CM | POA: Diagnosis not present

## 2019-11-01 NOTE — Patient Instructions (Signed)
Thank you allowing the Chronic Care Management Team to be a part of your care! It was a pleasure speaking with you today!     CCM (Chronic Care Management) Team    Noreene Larsson RN, MSN, CCM Nurse Care Coordinator  [phone]   [name] PharmD  Clinical Pharmacist  [phone]   Eula Fried LCSW Clinical Social Worker [phone]  Visit Information  Goals Addressed            This Visit's Progress   . PharmD - Medication Management       CARE PLAN ENTRY (see longitudinal plan of care for additional care plan information)  Current Barriers:    . Chronic Disease Management support, education, and care coordination needs related to hypertension, osteoarthritis, hyperlipidemia, insomnia and memory loss . Cognitive Deficits  Pharmacist Clinical Goal(s):  [name] Over the next 30 days, patient/caregiver will work with CM Pharmacist to address needs related to medication management  Interventions: . Perform chart review o Patient hospitalized at [facility] 10/1-10/5 related to COVID-19 infection. Discharged from hospital to rehab [facility]). . Follow up with patient's daughter/caregiver today. [name] reports patient still at rehab facility with discharge planned for 10/28. o Counsel caregiver on importance of reviewing/obtaining discharge medication list at time of discharge from rehab facility and requesting Rx as needed . Coordination of care: Encourage caregiver to schedule post-discharge visit with PCP and reschedule appointment with Neurology . Schedule appointment with caregiver to perform medication review and review home BP log for results following discharge . Will collaborate with CCM team  Patient Self Care Activities:  . Patient to take medications as directed with assistance of daughter o Daughter aids Mr. Morey with filling his weekly pillbox and monitoring for adherence . Attends all scheduled provider appointments . Calls  pharmacy for medication refills . Calls provider office for new concerns or questions . Patient/daughter to keep log of BP readings   Please see past updates related to this goal by clicking on the "Past Updates" button in the selected goal         Patient verbalizes understanding of instructions provided today.   Telephone follow up appointment with care management team member scheduled for: 11/5 at 9 am  [name], PharmD, [place] [phone]

## 2019-11-01 NOTE — Chronic Care Management (AMB) (Signed)
Chronic Care Management   Follow Up Note   11/01/2019 Name: Randy [name] MRN: [id number] DOB: 1934/09/28  Referred by: [name], DO Reason for referral : Chronic Care Management (Caregiver Phone Call)   Randy [name] is a 85 y.o. year old male who is a primary care patient of [name], DO. The CCM team was consulted for assistance with chronic disease management and care coordination needs.    Patient hospitalized at [facility] 10/1-10/5 related to COVID-19 infection. Patient discharged to rehab facility.  I reached out to patient's daughter/caregiver, Randy [name], by phone today.    Review of patient status, including review of consultants reports, relevant laboratory and other test results, and collaboration with appropriate care team members and the patient's provider was performed as part of comprehensive patient evaluation and provision of chronic care management services.    SDOH (Social Determinants of Health) assessments performed: No See Care Plan activities for detailed interventions related to [facility])     Outpatient Encounter Medications as of 11/01/2019  Medication Sig  . amLODipine (NORVASC) 5 MG tablet Take 1 tablet (5 mg total) by mouth daily.  [name] aspirin EC 81 MG tablet Take 1 tablet (81 mg total) by mouth daily. Swallow whole.  . carbidopa-levodopa (SINEMET IR) 25-100 MG tablet Take 1 tablet by mouth 4 (four) times daily -  with meals and at bedtime.  . cholecalciferol (VITAMIN D) 1000 units tablet Take 1,000 Units by mouth daily.  . Coenzyme Q10 (CO Q 10) 100 MG CAPS Take 100 mg by mouth daily.   . diclofenac Sodium (VOLTAREN) 1 % GEL Apply 2 g topically 4 (four) times daily as needed. To knee arthritis pain  . docusate sodium (COLACE) 100 MG capsule Take 1 capsule (100 mg total) by mouth 2 (two) times daily.  . fluticasone (FLONASE) 50 MCG/ACT nasal spray Place 2 sprays into both nostrils daily as needed for  allergies.   . furosemide (LASIX) 20 MG tablet Take 1 tablet (20 mg total) by mouth every other day.  . melatonin 5 MG TABS Take 5 mg by mouth at bedtime as needed (sleep).  . Multiple Vitamin (MULTIVITAMIN WITH MINERALS) TABS tablet Take 1 tablet by mouth daily.  [name] OLANZapine (ZYPREXA) 5 MG tablet Take 1 tablet (5 mg total) by mouth at bedtime as needed (agitation).  . Omega-3 Fatty Acids (FISH OIL) 1200 MG CAPS Take 1,200 mg by mouth 2 (two) times daily.   [name] omeprazole (PRILOSEC) 20 MG capsule Take 20 mg by mouth daily.  . risperiDONE (RISPERDAL) 0.5 MG tablet Take 1 tablet (0.5 mg total) by mouth 2 (two) times daily as needed (agitation).  . traZODone (DESYREL) 50 MG tablet Take 1 tablet (50 mg total) by mouth at bedtime.  . vitamin C (ASCORBIC ACID) 500 MG tablet Take 500 mg by mouth daily.  [name] zinc sulfate 220 (50 Zn) MG capsule Take 1 capsule (220 mg total) by mouth daily.   No facility-administered encounter medications on file as of 11/01/2019.    Goals Addressed            This Visit's Progress   . PharmD - Medication Management       CARE PLAN ENTRY (see longitudinal plan of care for additional care plan information)  Current Barriers:    . Chronic Disease Management support, education, and care coordination needs related to hypertension, osteoarthritis, hyperlipidemia, insomnia and memory loss . Cognitive Deficits  Pharmacist Clinical Goal(s):  .  Over the next 30 days, patient/caregiver will work with CM Pharmacist to address needs related to medication management  Interventions: . Perform chart review o Patient hospitalized at [facility] 10/1-10/5 related to COVID-19 infection. Discharged from hospital to rehab [facility]). . Follow up with patient's daughter/caregiver today. Randy [name] reports patient still at rehab facility with discharge planned for 10/28. o Counsel caregiver on importance of reviewing/obtaining discharge medication list at time  of discharge from rehab facility and requesting Rx as needed . Coordination of care: Encourage caregiver to schedule post-discharge visit with PCP and reschedule appointment with Neurology . Schedule appointment with caregiver to perform medication review and review home BP log for results following discharge . Will collaborate with CCM team  Patient Self Care Activities:  . Patient to take medications as directed with assistance of daughter o Daughter aids Mr. Briggs with filling his weekly pillbox and monitoring for adherence . Attends all scheduled provider appointments . Calls pharmacy for medication refills . Calls provider office for new concerns or questions . Patient/daughter to keep log of BP readings   Please see past updates related to this goal by clicking on the "Past Updates" button in the selected goal         Plan  Telephone follow up appointment with care management team member scheduled for: 11/5 at 9 am  [name], PharmD, [place] [phone]

## 2019-11-08 ENCOUNTER — Telehealth: Payer: Self-pay | Admitting: General Practice

## 2019-11-08 ENCOUNTER — Ambulatory Visit: Payer: Self-pay | Admitting: General Practice

## 2019-11-08 DIAGNOSIS — R531 Weakness: Secondary | ICD-10-CM

## 2019-11-08 DIAGNOSIS — E782 Mixed hyperlipidemia: Secondary | ICD-10-CM

## 2019-11-08 DIAGNOSIS — M25561 Pain in right knee: Secondary | ICD-10-CM

## 2019-11-08 DIAGNOSIS — G8929 Other chronic pain: Secondary | ICD-10-CM

## 2019-11-08 DIAGNOSIS — I10 Essential (primary) hypertension: Secondary | ICD-10-CM

## 2019-11-08 DIAGNOSIS — R413 Other amnesia: Secondary | ICD-10-CM

## 2019-11-08 NOTE — Patient Instructions (Signed)
Visit Information  Goals Addressed              This Visit's Progress     RNCM: Pt's daughter- "the pain is better for a few days after he gets the shots" (pt-stated)        CARE PLAN ENTRY (see longitudinal plan of care for additional care plan information)  Current Barriers:   Knowledge Deficits related to pain relief in patient with chronic knee pain  Care Coordination needs related to pain control in a patient with Chronic knee pain (disease states)  Nurse Case Manager Clinical Goal(s):   Over the next 120 days, patient will verbalize understanding of plan for pain management for chronic knee pain  Over the next 120 days, patient will work with [facility], [place] team, and pcp to address needs related to pain in bilateral knees and best treatment options  Over the next 120 days, patient will demonstrate a decrease in pain exacerbations as evidenced by controlled level of pain in bilateral knees  Over the next 120 days, patient will demonstrate improved health management independence as evidenced byno falls, ability to do normal activities and improved quality of life  Interventions:   Inter-disciplinary care team collaboration (see longitudinal plan of care)  Evaluation of current treatment plan related to pain management  and patient's adherence to plan as established by provider. 10-04-2019: The patient is having worsening knee pain and is to the point where he can not walk well at all. Denies any new falls.  The patients daughter was getting to call rescue to have the patient transported to the hospital to be evaluated for several things. She was allowing the patient to finish eating. 11-08-2019.  The patient was discharged from rehab on 11-04-2019.  The daughter states he is home and doing well. Working with PT 3 days a week. Has already started working with the patient. She can see a positive change in the patient.   Advised patient to call the provider for worsening sx/sx of  uncontrolled pain or increased level of intensity of pain.    Provided education to patient re: alternative pain relief measures.   Reviewed medications with patient and discussed compliance.  The patient states that the shots received on 7-12 were helpful for pain relief. This work for a few days and then the patient generally complains again with pain.  The patient has had this for a while per the daughter and the topical NSAIDs do work and help also. 11-08-2019: The patient is doing well post hospitalization and rehab stay.  The daughter states she can see a positive change in him.   Discussed plans with patient for ongoing care management follow up and provided patient with direct contact information for care management team  Patient Self Care Activities:   Patient verbalizes understanding of plan to manage pain of bilateral knees by working with the pcp and CCM team  Calls provider office for new concerns or questions  Unable to independently manage pain and discomfort of bilateral knees  Please see past updates related to this goal by clicking on the "Past Updates" button in the selected goal        RNCM: Recently Dad has had swelling in both of his legs and feet        Current Barriers:   Knowledge Deficits related to management of HTN as evidence of swelling in feet and legs and HLD,  not always being able to eat a heart healthy diet  Cognitive  Deficits  Nurse Case Manager Clinical Goal(s):   Over the next 90 days, patient will verbalize understanding of plan for managing HTN and HLD  Over the next 90 days, patient will work with [facility], CCM pharmacist and pcp to address needs related to managing HTN and increased swelling in feet and legs  Over the next 90 days, patient will demonstrate a decrease in bilateral lower extremity edema exacerbations as evidenced by normalized blood pressures and no evidence of edema present in bilateral lower legs  Over the next 90 days, patient  will demonstrate improved adherence to prescribed treatment plan for wearing knee high ted hose as evidenced bydecreased bilateral lower leg edema  Over the next 90 days, patient will work with CM team pharmacist to review medications as needed and take Lasix as prescribed by pcp  Over the next 90 days, the patient will demonstrate ongoing self health care management ability as evidenced by elevating legs when sitting, taking meds as prescribed, following a heart healthy diet, and wearing ted hose to reduce bilateral lower leg edema.  Interventions:   Evaluation of current treatment plan related to HTN and HLD and patient's adherence to plan as established by provider. 10-04-2019: The daughter states that the patient is having shortness of breath, a cough, and edema in bilateral legs that is worse in his right foot. States the cough has not been productive. Evaluation if the patient has been around anyone that is COVID positive and the daughter states not that she is aware of. The patient has several issues going on with knee pain with limited mobility and GI bleeding that is not better. The patient feels he needs to be evaluated at the hospital and after eating he has requested for his daughter to call EMS. Empathetic listening and support given. 10-11-2019: The patient is currently inpatient at [facility]. The patient will be going to "[facility]" likely tomorrow, per the daughter for rehabilitation.  The patient tested positive for COVID 19 and received the Immuglobin infusion on Wednesday. The patient was not getting better and was hospitalized on 10-08-2019 for weakness and UTI.  The patient also had confusion. Explained this was likely due to the UTI. The daughter had called and left a VM asking for the [facility] to help.  The whole family is COVID positive and she was concerned because she could not provide care for the patient. Empathetic listening and support given. 11-08-2019: The  patient is home from rehab and working with PT. Has post discharge appointment with pcp on 11-10-2019.  The daughter states they have not been taking his blood pressure at home but will start back getting a routine going.  They did make changes to a medication but she can not remember what it was.  Advised to bring all paperwork with the patient to the pcp visit on 11-10-2019.    Provided education to patient re: best way to put on ted hose with the use of a plastic grocery bag for ease of putting on hose. Also alternative compression socks" found on [place] or at [place] that my be beneficial for the patient. 10-04-2019: The patients daughter states that the patient is still having issues putting on the ted hose even with the ideas given to them with the grocery bags.  The daughter did get the patient some diabetic socks.He can put those on. Education given on elevation of legs when sitting and to try ace wraps, just make sure they are not too tight  around his legs. The patient is having worsening swelling today and also shortness of breath. The patients daughter was out of town this weekend but endorses compliance with medications. The patient has several health issues impacting his care right now. 11-08-2019: The patient had reduced swelling while in the hospital and the first part of the rehab stay but states that the swelling is back in his legs. Review of eating habits and medication. The patient has not been taking the Lasix since getting home. The patient also has been up on his feet a lot. Education on watching sodium in diet and having the patient elevate his feet and legs when he is sitting down. The daughter verbalized understanding.   Reviewed medications with patient and discussed the use of Lasix. The daughter verbalized the patient did not have any Lasix. The patient has been started back on Lasix and per the daughter today the Lasix has been effective in helping with the swelling and she has noted  decrease in swelling. 11-08-2019: The daughter states the patient is not taking Lasix at this time. The patients daughter  instructed to write down questions to ask Dr. [place] Ranger at the visit on Wednesday and to see what his recommendations are. Also to take all paperwork from discharge with them to the pcp visit.  Will collaborate with pcp and pharmacist concerning medication changes. The daughter states the rehab facility was supposed to call in his medication refills to [facility] but they have not called for pick up yet.  Advised the daughter to call rehab facility for follow up. The patients daughter did state they were not out of medications.   Discussed plans with patient for ongoing care management follow up and provided patient with direct contact information for care management team  Advised patient, providing education and rationale, to monitor blood pressure daily and record, calling pcp for findings outside established parameters.   Provided patient with heart healthy  educational materials related to adhering to heart healthy diet   Pharmacy referral for medication change follow up and support  Educated daughter on the [place] functionality to view notes/lab work and communicate with the pcp team  Patient Self Care Activities:   Patient verbalizes understanding of plan to use Lasix prn for swelling in feet and legs per MD recommendations  Attends all scheduled provider appointments  Calls provider office for new concerns or questions  Unable to independently manage bilateral lower leg edema  Please see past updates related to this goal by clicking on the "Past Updates" button in the selected goal         Patient verbalizes understanding of instructions provided today.   Telephone follow up appointment with care management team member scheduled for: 12-30-2019 at 10:30 am  [place], MSN, [place] Mobile: [phone]

## 2019-11-08 NOTE — Chronic Care Management (AMB) (Signed)
Chronic Care Management   Follow Up Note   11/08/2019 Name: Randy [name] MRN: [id number] DOB: [birth year]  Referred by: [name], DO Reason for referral : Chronic Care Management (RNCM Follow up call for Chronic Disease Managment and Care Coordination Needs)   Randy [name] is a 85 y.o. year old male who is a primary care patient of [name], DO. The CCM team was consulted for assistance with chronic disease management and care coordination needs.    Review of patient status, including review of consultants reports, relevant laboratory and other test results, and collaboration with appropriate care team members and the patient's provider was performed as part of comprehensive patient evaluation and provision of chronic care management services.    SDOH (Social Determinants of Health) assessments performed: Yes See Care Plan activities for detailed interventions related to [facility])     Outpatient Encounter Medications as of 11/08/2019  Medication Sig  . amLODipine (NORVASC) 5 MG tablet Take 1 tablet (5 mg total) by mouth daily.  [name] aspirin EC 81 MG tablet Take 1 tablet (81 mg total) by mouth daily. Swallow whole.  . carbidopa-levodopa (SINEMET IR) 25-100 MG tablet Take 1 tablet by mouth 4 (four) times daily -  with meals and at bedtime.  . cholecalciferol (VITAMIN D) 1000 units tablet Take 1,000 Units by mouth daily.  . Coenzyme Q10 (CO Q 10) 100 MG CAPS Take 100 mg by mouth daily.   . diclofenac Sodium (VOLTAREN) 1 % GEL Apply 2 g topically 4 (four) times daily as needed. To knee arthritis pain  . docusate sodium (COLACE) 100 MG capsule Take 1 capsule (100 mg total) by mouth 2 (two) times daily.  . fluticasone (FLONASE) 50 MCG/ACT nasal spray Place 2 sprays into both nostrils daily as needed for allergies.   . furosemide (LASIX) 20 MG tablet Take 1 tablet (20 mg total) by mouth every other day.  . melatonin 5 MG TABS Take 5 mg by mouth at bedtime as needed  (sleep).  . Multiple Vitamin (MULTIVITAMIN WITH MINERALS) TABS tablet Take 1 tablet by mouth daily.  [name] OLANZapine (ZYPREXA) 5 MG tablet Take 1 tablet (5 mg total) by mouth at bedtime as needed (agitation).  . Omega-3 Fatty Acids (FISH OIL) 1200 MG CAPS Take 1,200 mg by mouth 2 (two) times daily.   [name] omeprazole (PRILOSEC) 20 MG capsule Take 20 mg by mouth daily.  . risperiDONE (RISPERDAL) 0.5 MG tablet Take 1 tablet (0.5 mg total) by mouth 2 (two) times daily as needed (agitation).  . traZODone (DESYREL) 50 MG tablet Take 1 tablet (50 mg total) by mouth at bedtime.  . vitamin C (ASCORBIC ACID) 500 MG tablet Take 500 mg by mouth daily.  [name] zinc sulfate 220 (50 Zn) MG capsule Take 1 capsule (220 mg total) by mouth daily.   No facility-administered encounter medications on file as of 11/08/2019.     Objective:  BP Readings from Last 3 Encounters:  10/12/19 132/78  10/06/19 (!) 143/87  10/04/19 103/67    Goals Addressed              This Visit's Progress   .  RNCM: Pt's daughter- "the pain is better for a few days after he gets the shots" (pt-stated)        CARE PLAN ENTRY (see longitudinal plan of care for additional care plan information)  Current Barriers:  [name] Knowledge Deficits related to pain relief in patient with chronic knee pain .  Care Coordination needs related to pain control in a patient with Chronic knee pain (disease states)  Nurse Case Manager Clinical Goal(s):  [name] Over the next 120 days, patient will verbalize understanding of plan for pain management for chronic knee pain . Over the next 120 days, patient will work with [facility] [place] team, and pcp to address needs related to pain in bilateral knees and best treatment options . Over the next 120 days, patient will demonstrate a decrease in pain exacerbations as evidenced by controlled level of pain in bilateral knees . Over the next 120 days, patient will demonstrate improved health management independence as evidenced byno  falls, ability to do normal activities and improved quality of life  Interventions:  . Inter-disciplinary care team collaboration (see longitudinal plan of care) . Evaluation of current treatment plan related to pain management  and patient's adherence to plan as established by provider. 10-04-2019: The patient is having worsening knee pain and is to the point where he can not walk well at all. Denies any new falls.  The patients daughter was getting to call rescue to have the patient transported to the hospital to be evaluated for several things. She was allowing the patient to finish eating. 11-08-2019.  The patient was discharged from rehab on 11-04-2019.  The daughter states he is home and doing well. Working with PT 3 days a week. Has already started working with the patient. She can see a positive change in the patient.  . Advised patient to call the provider for worsening sx/sx of uncontrolled pain or increased level of intensity of pain.   . Provided education to patient re: alternative pain relief measures.  . Reviewed medications with patient and discussed compliance.  The patient states that the shots received on 7-12 were helpful for pain relief. This work for a few days and then the patient generally complains again with pain.  The patient has had this for a while per the daughter and the topical NSAIDs do work and help also. 11-08-2019: The patient is doing well post hospitalization and rehab stay.  The daughter states she can see a positive change in him.  . Discussed plans with patient for ongoing care management follow up and provided patient with direct contact information for care management team  Patient Self Care Activities:  . Patient verbalizes understanding of plan to manage pain of bilateral knees by working with the pcp and CCM team . Calls provider office for new concerns or questions . Unable to independently manage pain and discomfort of bilateral knees  Please see past  updates related to this goal by clicking on the "Past Updates" button in the selected goal      .  RNCM: Recently Dad has had swelling in both of his legs and feet        Current Barriers:  [name] Knowledge Deficits related to management of HTN as evidence of swelling in feet and legs and HLD,  not always being able to eat a heart healthy diet . Cognitive Deficits  Nurse Case Manager Clinical Goal(s):  [name] Over the next 90 days, patient will verbalize understanding of plan for managing HTN and HLD . Over the next 90 days, patient will work with [facility] [place] pharmacist and pcp to address needs related to managing HTN and increased swelling in feet and legs . Over the next 90 days, patient will demonstrate a decrease in bilateral lower extremity edema exacerbations as evidenced by normalized blood pressures and no  evidence of edema present in bilateral lower legs . Over the next 90 days, patient will demonstrate improved adherence to prescribed treatment plan for wearing knee high ted hose as evidenced bydecreased bilateral lower leg edema . Over the next 90 days, patient will work with CM team pharmacist to review medications as needed and take Lasix as prescribed by pcp . Over the next 90 days, the patient will demonstrate ongoing self health care management ability as evidenced by elevating legs when sitting, taking meds as prescribed, following a heart healthy diet, and wearing ted hose to reduce bilateral lower leg edema.  Interventions:  . Evaluation of current treatment plan related to HTN and HLD and patient's adherence to plan as established by provider. 10-04-2019: The daughter states that the patient is having shortness of breath, a cough, and edema in bilateral legs that is worse in his right foot. States the cough has not been productive. Evaluation if the patient has been around anyone that is COVID positive and the daughter states not that she is aware of. The patient has several issues going on  with knee pain with limited mobility and GI bleeding that is not better. The patient feels he needs to be evaluated at the hospital and after eating he has requested for his daughter to call EMS. Empathetic listening and support given. 10-11-2019: The patient is currently inpatient at [facility]. The patient will be going to "[facility]" likely tomorrow, per the daughter for rehabilitation.  The patient tested positive for COVID 19 and received the Immuglobin infusion on Wednesday. The patient was not getting better and was hospitalized on 10-08-2019 for weakness and UTI.  The patient also had confusion. Explained this was likely due to the UTI. The daughter had called and left a VM asking for the [facility] to help.  The whole family is COVID positive and she was concerned because she could not provide care for the patient. Empathetic listening and support given. 11-08-2019: The patient is home from rehab and working with PT. Has post discharge appointment with pcp on 11-10-2019.  The daughter states they have not been taking his blood pressure at home but will start back getting a routine going.  They did make changes to a medication but she can not remember what it was.  Advised to bring all paperwork with the patient to the pcp visit on 11-10-2019.   [name] Provided education to patient re: best way to put on ted hose with the use of a plastic grocery bag for ease of putting on hose. Also alternative compression socks" found on [place] or at [place] that my be beneficial for the patient. 10-04-2019: The patients daughter states that the patient is still having issues putting on the ted hose even with the ideas given to them with the grocery bags.  The daughter did get the patient some diabetic socks.He can put those on. Education given on elevation of legs when sitting and to try ace wraps, just make sure they are not too tight around his legs. The patient is having worsening swelling today and also  shortness of breath. The patients daughter was out of town this weekend but endorses compliance with medications. The patient has several health issues impacting his care right now. 11-08-2019: The patient had reduced swelling while in the hospital and the first part of the rehab stay but states that the swelling is back in his legs. Review of eating habits and medication. The patient has not been  taking the Lasix since getting home. The patient also has been up on his feet a lot. Education on watching sodium in diet and having the patient elevate his feet and legs when he is sitting down. The daughter verbalized understanding.  . Reviewed medications with patient and discussed the use of Lasix. The daughter verbalized the patient did not have any Lasix. The patient has been started back on Lasix and per the daughter today the Lasix has been effective in helping with the swelling and she has noted decrease in swelling. 11-08-2019: The daughter states the patient is not taking Lasix at this time. The patients daughter  instructed to write down questions to ask Dr. Parks Ranger at the visit on Wednesday and to see what his recommendations are. Also to take all paperwork from discharge with them to the pcp visit.  Will collaborate with pcp and pharmacist concerning medication changes. The daughter states the rehab facility was supposed to call in his medication refills to [facility] but they have not called for pick up yet.  Advised the daughter to call rehab facility for follow up. The patients daughter did state they were not out of medications.  . Discussed plans with patient for ongoing care management follow up and provided patient with direct contact information for care management team . Advised patient, providing education and rationale, to monitor blood pressure daily and record, calling pcp for findings outside established parameters.  . Provided patient with heart healthy  educational materials related to adhering  to heart healthy diet  . Pharmacy referral for medication change follow up and support . Educated daughter on the [place] functionality to view notes/lab work and communicate with the pcp team  Patient Self Care Activities:  . Patient verbalizes understanding of plan to use Lasix prn for swelling in feet and legs per MD recommendations . Attends all scheduled provider appointments . Calls provider office for new concerns or questions . Unable to independently manage bilateral lower leg edema  Please see past updates related to this goal by clicking on the "Past Updates" button in the selected goal          Plan:   Telephone follow up appointment with care management team member scheduled for: 12-30-2019 at 30 am   Noreene Larsson RN, MSN, [place] Mobile: [phone]

## 2019-11-09 ENCOUNTER — Ambulatory Visit: Payer: Self-pay | Admitting: Licensed Clinical Social Worker

## 2019-11-09 DIAGNOSIS — I10 Essential (primary) hypertension: Secondary | ICD-10-CM

## 2019-11-09 DIAGNOSIS — E782 Mixed hyperlipidemia: Secondary | ICD-10-CM

## 2019-11-09 DIAGNOSIS — Z748 Other problems related to care provider dependency: Secondary | ICD-10-CM

## 2019-11-09 DIAGNOSIS — R531 Weakness: Secondary | ICD-10-CM

## 2019-11-09 DIAGNOSIS — R413 Other amnesia: Secondary | ICD-10-CM

## 2019-11-09 DIAGNOSIS — Z789 Other specified health status: Secondary | ICD-10-CM

## 2019-11-09 NOTE — Chronic Care Management (AMB) (Signed)
Chronic Care Management    Clinical Social Work Follow Up Note  11/09/2019 Name: Randy [name] MRN: [id number] DOB: 1934-02-25  Randy [name] is a 85 y.o. year old male who is a primary care patient of [name], DO. The CCM team was consulted for assistance with Level of Care Concerns.   Review of patient status, including review of consultants reports, other relevant assessments, and collaboration with appropriate care team members and the patient's provider was performed as part of comprehensive patient evaluation and provision of chronic care management services.    SDOH (Social Determinants of Health) assessments performed: Yes    Outpatient Encounter Medications as of 11/09/2019  Medication Sig  . amLODipine (NORVASC) 5 MG tablet Take 1 tablet (5 mg total) by mouth daily.  Randy [name] aspirin EC 81 MG tablet Take 1 tablet (81 mg total) by mouth daily. Swallow whole.  . carbidopa-levodopa (SINEMET IR) 25-100 MG tablet Take 1 tablet by mouth 4 (four) times daily -  with meals and at bedtime.  . cholecalciferol (VITAMIN D) 1000 units tablet Take 1,000 Units by mouth daily.  . Coenzyme Q10 (CO Q 10) 100 MG CAPS Take 100 mg by mouth daily.   . diclofenac Sodium (VOLTAREN) 1 % GEL Apply 2 g topically 4 (four) times daily as needed. To knee arthritis pain  . docusate sodium (COLACE) 100 MG capsule Take 1 capsule (100 mg total) by mouth 2 (two) times daily.  . fluticasone (FLONASE) 50 MCG/ACT nasal spray Place 2 sprays into both nostrils daily as needed for allergies.   . furosemide (LASIX) 20 MG tablet Take 1 tablet (20 mg total) by mouth every other day.  . melatonin 5 MG TABS Take 5 mg by mouth at bedtime as needed (sleep).  . Multiple Vitamin (MULTIVITAMIN WITH MINERALS) TABS tablet Take 1 tablet by mouth daily.  Randy [name] OLANZapine (ZYPREXA) 5 MG tablet Take 1 tablet (5 mg total) by mouth at bedtime as needed (agitation).  . Omega-3 Fatty Acids (FISH OIL) 1200 MG CAPS Take 1,200 mg by mouth  2 (two) times daily.   Randy [name] omeprazole (PRILOSEC) 20 MG capsule Take 20 mg by mouth daily.  . risperiDONE (RISPERDAL) 0.5 MG tablet Take 1 tablet (0.5 mg total) by mouth 2 (two) times daily as needed (agitation).  . traZODone (DESYREL) 50 MG tablet Take 1 tablet (50 mg total) by mouth at bedtime.  . vitamin C (ASCORBIC ACID) 500 MG tablet Take 500 mg by mouth daily.  Randy [name] zinc sulfate 220 (50 Zn) MG capsule Take 1 capsule (220 mg total) by mouth daily.   No facility-administered encounter medications on file as of 11/09/2019.     Goals Addressed    .  SW-"I'm having a hard time managing my health" (pt-stated)        Current Barriers:  . Financial constraints . Level of care concerns . ADL IADL limitations . Social Isolation . Limited access to caregiver . Memory Deficits . Lacks knowledge of community resource: personal care service resources within the area that may benefit patient . Lack of socialization   Clinical Social Work Clinical Goal(s):  Randy [name] Over the next 90 days, client will work with SW to address concerns related to lack of care/support within the home  Interventions: . Patient interviewed and appropriate assessments performed . LCSW spoke wit patient's daughter on 11/09/19. Patient recently discharged from SNF after completing 21 days of rehab. HH has started. Patient has a PCP appointment and [facility] appointment tomorrow  and daughter will need to contact [facility] to make sure that their appointments are not at the same time. [facility] number provided to daughter.  . Provided patient and family with information about personal care service resources within the area. Patient reports that he lives with his spouse and daughter and that his daughter and spouse are his main sources of support that assist him with transportation/meal prep/daily needs.  . Discussed plans with patient for ongoing care management follow up and provided patient with direct contact information for care management  team . Advised patient to contact CCM team with any urgent concerns . Assisted patient/caregiver with obtaining information about health plan benefits . Provided education and assistance to client regarding Advanced Directives. . Provided education to patient/caregiver regarding level of care options. Randy Kitchen LCSW provided self-care education. Patient has ongoing issue with memory and sleep. Patient was encouraged to take medications as prescribed and to ask for help (daughter or spouse) when needed and to try to check blood pressure and record it once a day per PCP's recommendation. Family continue to encourage patient to keep up with blood pressure recordings.  . Patient has been having pain and discomfort in his right knee which has affected his mobility. PT has restarted. Patient has been wearing his compression stockings. . Daughter/patient reports pt is still taking melatonin dose of 10mg  per night but still struggles with insomnia. . Daughter/patient declined needing MOW's at this time.  . Patient admits to feeling lonely at times due to isolation related to COVID-19. LCSW provided family with community resource education for socialization ([place]) Family wish for LCSW to mail this information to their address. LCSW sent out resources on 07/16/19. Emotional support and coping skill education provided as well. Patient was very receptive to this information. UPDATE- Family report that they contacted [facility] and was informed that they have closed again due to [place]. They are expecting to open up again in October and patient is agreeable to attend center in order to gain socialization.  . Daughter reports ongoing swelling in patient's legs and feet. Patient is not taking his lasix as he has had a difficult time with making it to the bathroom in time. LCSW encouraged daughter to ensure that patient elevates his legs and wears his compression stockings.   Randy Kitchen LCSW completed referral for [facility] to assist with ramp resources, Meals on Wheels and to place patient on the wait list for [facility] through [facility]. Family report that they are unable to pay out of pocket for an aide but that he needs this level of support. Family wish to gain a ramp (for both patient and spouse) at the front of their home in order to prevent future falls.  - Patient Self Care Activities:  . Attends all scheduled provider appointments . Calls provider office for new concerns or questions  Please see past updates related to this goal by clicking on the "Past Updates" button in the selected goal       Follow Up Plan: SW will follow up with patient by phone over the next 60 days  Eula Fried, [employer], MSW, [email or web address] Phone: [phone]

## 2019-11-10 ENCOUNTER — Other Ambulatory Visit: Payer: Self-pay

## 2019-11-10 ENCOUNTER — Encounter: Payer: Self-pay | Admitting: Family Medicine

## 2019-11-10 ENCOUNTER — Ambulatory Visit (INDEPENDENT_AMBULATORY_CARE_PROVIDER_SITE_OTHER): Payer: Medicare Other | Admitting: Family Medicine

## 2019-11-10 VITALS — BP 141/78 | HR 72 | Temp 97.5°F | Resp 16 | Ht 72.0 in | Wt 229.6 lb

## 2019-11-10 DIAGNOSIS — Z23 Encounter for immunization: Secondary | ICD-10-CM | POA: Diagnosis not present

## 2019-11-10 DIAGNOSIS — F0280 Dementia in other diseases classified elsewhere without behavioral disturbance: Secondary | ICD-10-CM

## 2019-11-10 DIAGNOSIS — I10 Essential (primary) hypertension: Secondary | ICD-10-CM | POA: Diagnosis not present

## 2019-11-10 DIAGNOSIS — R410 Disorientation, unspecified: Secondary | ICD-10-CM | POA: Diagnosis not present

## 2019-11-10 DIAGNOSIS — G3183 Dementia with Lewy bodies: Secondary | ICD-10-CM

## 2019-11-10 MED ORDER — OLANZAPINE 5 MG PO TABS: 5.0000 mg | ORAL_TABLET | Freq: Every evening | ORAL | 0 refills | Status: DC | PRN

## 2019-11-10 MED ORDER — AMLODIPINE BESYLATE 2.5 MG PO TABS: 2.5000 mg | ORAL_TABLET | Freq: Every day | ORAL | 2 refills | Status: DC

## 2019-11-10 NOTE — Patient Instructions (Addendum)
Thank you for coming to the office today.  Start new BP medication Amlodipine 2.5mg  once daily. _ check rx bottles for old BP meds - do not take Enalapril and Hydrochlorothiazide anymore - keep track of BP  Resume Furosemide 20mg  every other day for swelling.  Not going to re order the new med Risperidone  Instead we will re order - Olanzapine (Zyprexa) 5mg  nightly bedtime as needed for confusion, or future can consider for sleep as needed.  Stop Trazodone.  For muscle spasms, Recommend to start taking Tylenol Extra Strength 500mg  tabs - take 1 to 2 tabs per dose (max 1000mg ) every 6-8 hours for pain (take regularly, don't skip a dose for next 7 days), max 24 hour daily dose is 6 tablets or 3000mg . In the future you can repeat the same everyday Tylenol course for 1-2 weeks at a time.   Can use OTC muscle rub as well, Voltaren   Please schedule a Follow-up Appointment to: Return in about 3 months (around 02/10/2020) for 3 month follow-up HTN, back pain, weakness.  If you have any other questions or concerns, please feel free to call the office or send a message through [place]. You may also schedule an earlier appointment if necessary.  Additionally, you may be receiving a survey about your experience at our office within a few days to 1 week by e-mail or mail. We value your feedback.  [name], DO [place]

## 2019-11-10 NOTE — Progress Notes (Signed)
Subjective:    Patient ID: Randy [name], male    DOB: Sep 25, 1934, 85 y.o.   MRN: [id number]  Randy [name] is a 85 y.o. male presenting on 11/10/2019 for Hospitalization Follow-up (Weakness)   HPI  HOSPITAL FOLLOW-UP VISIT  Hospital/Location: [facility] Date of Admission: 10/08/19 Date of Discharge: 10/12/19 Transitions of care telephone call: Not completed.  Reason for Admission: Acute delirium in setting of dementia Primary (+Secondary) Diagnosis: COVID, Delirium  - Hospital H&P and Discharge Summary have been reviewed - Patient presents today about 1 month after recent hospitalization, and about 5 days after leaving skilled nursing facility [facility].   Brief summary of recent course reviewed.  They made some med changes to BP regimen in hospital  New BP med Amlodipine 5mg  - now takes half tab for dose 2.5mg  Needs new rx - Due to resume Furosemide 20mg  every other day, held it since returning home, but now can re-start - He was taken off Enalapril 20mg  BID, and HCTZ 12.5mg  daily?  Family asks about new meds - Risperdal 0.5mg  q 12 hr PRN agitation - Zyprexa 5mg  bedtime PRN agitation Has not taken yet, needs rx was not given from discharge  [facility] home [facility] currently, has [facility] nursing  - Today reports overall has done well after discharge. Symptoms of COVID / cough / dyspnea have resolved  No oxygen requirement.   I have reviewed the discharge medication list, and have reconciled the current and discharge medications today.   Current Outpatient Medications:  .  aspirin EC 81 MG tablet, Take 1 tablet (81 mg total) by mouth daily. Swallow whole., Disp: 30 tablet, Rfl: 0 .  carbidopa-levodopa (SINEMET IR) 25-100 MG tablet, Take 1 tablet by mouth 4 (four) times daily -  with meals and at bedtime., Disp: 120 tablet, Rfl: 0 .  cholecalciferol (VITAMIN D) 1000 units tablet, Take 1,000 Units by mouth daily., Disp: , Rfl:  .  Coenzyme Q10 (CO Q 10) 100 MG CAPS, Take 100 mg by  mouth daily. , Disp: , Rfl:  .  diclofenac Sodium (VOLTAREN) 1 % GEL, Apply 2 g topically 4 (four) times daily as needed. To knee arthritis pain, Disp: 100 g, Rfl: 3 .  docusate sodium (COLACE) 100 MG capsule, Take 1 capsule (100 mg total) by mouth 2 (two) times daily., Disp: 60 capsule, Rfl: 0 .  fluticasone (FLONASE) 50 MCG/ACT nasal spray, Place 2 sprays into both nostrils daily as needed for allergies. , Disp: , Rfl:  .  furosemide (LASIX) 20 MG tablet, Take 1 tablet (20 mg total) by mouth every other day., Disp: 45 tablet, Rfl: 2 .  melatonin 5 MG TABS, Take 5 mg by mouth at bedtime as needed (sleep)., Disp: , Rfl:  .  Multiple Vitamin (MULTIVITAMIN WITH MINERALS) TABS tablet, Take 1 tablet by mouth daily., Disp: 30 tablet, Rfl: 0 .  OLANZapine (ZYPREXA) 5 MG tablet, Take 1 tablet (5 mg total) by mouth at bedtime as needed (agitation)., Disp: 30 tablet, Rfl: 0 .  Omega-3 Fatty Acids (FISH OIL) 1200 MG CAPS, Take 1,200 mg by mouth 2 (two) times daily. , Disp: , Rfl:  .  omeprazole (PRILOSEC) 20 MG capsule, Take 20 mg by mouth daily., Disp: , Rfl:  .  vitamin C (ASCORBIC ACID) 500 MG tablet, Take 500 mg by mouth daily., Disp: , Rfl:  .  zinc sulfate 220 (50 Zn) MG capsule, Take 1 capsule (220 mg total) by mouth daily., Disp: 30 capsule, Rfl: 0 .  amLODipine (NORVASC) 2.5 MG tablet, Take 1 tablet (2.5 mg total) by mouth daily., Disp: 30 tablet, Rfl: 2  ------------------------------------------------------------------------- Social History   Tobacco Use  . Smoking status: Former Smoker    Packs/day: 1.00    Years: 12.00    Pack years: 12.00    Types: Cigarettes    Quit date: 1968    Years since quitting: 53.8  . Smokeless tobacco: Never Used  Vaping Use  . Vaping Use: Never used  Substance Use Topics  . Alcohol use: No  . Drug use: No    Review of Systems Per HPI unless specifically indicated above      Objective:    BP (!) 141/78   Pulse 72   Temp (!) 97.5 F (36.4 C)  (Temporal)   Resp 16   Ht 6' (1.829 m)   Wt 229 lb 9.6 oz (104.1 kg)   SpO2 100%   BMI 31.14 kg/m   Wt Readings from Last 3 Encounters:  11/10/19 229 lb 9.6 oz (104.1 kg)  10/07/19 220 lb (99.8 kg)  10/04/19 240 lb (108.9 kg)    Physical Exam Vitals and nursing note reviewed.  Constitutional:      General: He is not in acute distress.    Appearance: He is well-developed. He is obese. He is not diaphoretic.     Comments: Well-appearing, comfortable, cooperative  HENT:     Head: Normocephalic and atraumatic.  Eyes:     General:        Right eye: No discharge.        Left eye: No discharge.     Conjunctiva/sclera: Conjunctivae normal.  Neck:     Thyroid: No thyromegaly.  Cardiovascular:     Rate and Rhythm: Normal rate and regular rhythm.     Heart sounds: Normal heart sounds. No murmur heard.   Pulmonary:     Effort: Pulmonary effort is normal. No respiratory distress.     Breath sounds: Normal breath sounds. No wheezing or rales.  Musculoskeletal:        General: Normal range of motion.     Cervical back: Normal range of motion and neck supple.     Right lower leg: Edema present.     Left lower leg: Edema present.     Comments: Wheelchair bound, able to stand from seated with assistance and use cane/bed to hold on to. Unable to ambulate due to weakness  Lymphadenopathy:     Cervical: No cervical adenopathy.  Skin:    General: Skin is warm and dry.     Findings: No erythema or rash.  Neurological:     Mental Status: He is alert and oriented to person, place, and time.  Psychiatric:        Behavior: Behavior normal.     Comments: Well groomed, good eye contact, normal speech and thoughts       Results for orders placed or performed during the hospital encounter of 10/08/19  Urine Culture   Specimen: Urine, Random  Result Value Ref Range   Specimen Description      URINE, RANDOM Performed at [facility], [address] [place], [place] [address]      Special Requests      NONE Performed at [facility], [address] [place], [place] [address]    Culture      NO GROWTH Performed at [facility], [place] [address], [place],  [address]    Report Status 10/09/2019  FINAL   Basic metabolic panel  Result Value Ref Range   Sodium 136 135 - 145 mmol/L   Potassium 3.6 3.5 - 5.1 mmol/L   Chloride 100 98 - 111 mmol/L   CO2 23 22 - 32 mmol/L   Glucose, Bld 105 (H) 70 - 99 mg/dL   BUN 22 8 - 23 mg/dL   Creatinine, Ser 0.90 0.61 - 1.24 mg/dL   Calcium 8.3 (L) 8.9 - 10.3 mg/dL   GFR calc non Af Amer >60 >60 mL/min   GFR calc Af Amer >60 >60 mL/min   Anion gap 13 5 - 15  CBC  Result Value Ref Range   WBC 5.1 4.0 - 10.5 K/uL   RBC 4.26 4.22 - 5.81 MIL/uL   Hemoglobin 13.0 13.0 - 17.0 g/dL   HCT 36.5 (L) 39 - 52 %   MCV 85.7 80.0 - 100.0 fL   MCH 30.5 26.0 - 34.0 pg   MCHC 35.6 30.0 - 36.0 g/dL   RDW 15.5 11.5 - 15.5 %   Platelets 151 150 - 400 K/uL   nRBC 0.0 0.0 - 0.2 %  Brain natriuretic peptide  Result Value Ref Range   B Natriuretic Peptide 27.8 0.0 - 100.0 pg/mL  C-reactive protein  Result Value Ref Range   CRP 6.1 (H) <1.0 mg/dL  Fibrin derivatives D-Dimer ([facility] only)  Result Value Ref Range   Fibrin derivatives D-dimer ([facility]) 955.10 (H) 0.00 - 499.00 ng/mL (FEU)  Ferritin  Result Value Ref Range   Ferritin 105 24 - 336 ng/mL  Fibrinogen  Result Value Ref Range   Fibrinogen 442 210 - 475 mg/dL  Hepatitis B surface antigen  Result Value Ref Range   Hepatitis B Surface Ag NON REACTIVE NON REACTIVE  Lactate dehydrogenase  Result Value Ref Range   LDH 194 (H) 98 - 192 U/L  Procalcitonin  Result Value Ref Range   Procalcitonin <0.10 ng/mL  CBC  Result Value Ref Range   WBC 4.5 4.0 - 10.5 K/uL   RBC 4.19 (L) 4.22 - 5.81 MIL/uL   Hemoglobin 12.5 (L) 13.0 - 17.0 g/dL   HCT 36.1 (L) 39 - 52 %   MCV 86.2 80.0 - 100.0 fL   MCH 29.8 26.0 - 34.0 pg   MCHC 34.6 30.0 - 36.0 g/dL   RDW 15.5 11.5 -  15.5 %   Platelets 144 (L) 150 - 400 K/uL   nRBC 0.0 0.0 - 0.2 %  Creatinine, serum  Result Value Ref Range   Creatinine, Ser 1.02 0.61 - 1.24 mg/dL   GFR calc non Af Amer >60 >60 mL/min   GFR calc Af Amer >60 >60 mL/min  C-reactive protein  Result Value Ref Range   CRP 6.0 (H) <1.0 mg/dL  Fibrin derivatives D-Dimer ([facility] only)  Result Value Ref Range   Fibrin derivatives D-dimer ([facility]) 988.23 (H) 0.00 - 499.00 ng/mL (FEU)  Ferritin  Result Value Ref Range   Ferritin 179 24 - 336 ng/mL  Comprehensive metabolic panel  Result Value Ref Range   Sodium 138 135 - 145 mmol/L   Potassium 3.3 (L) 3.5 - 5.1 mmol/L   Chloride 99 98 - 111 mmol/L   CO2 27 22 - 32 mmol/L   Glucose, Bld 110 (H) 70 - 99 mg/dL   BUN 24 (H) 8 - 23 mg/dL   Creatinine, Ser 0.84 0.61 - 1.24 mg/dL   Calcium 8.5 (L) 8.9 - 10.3 mg/dL   Total Protein 7.3 6.5 - 8.1  g/dL   Albumin 3.5 3.5 - 5.0 g/dL   AST 53 (H) 15 - 41 U/L   ALT 8 0 - 44 U/L   Alkaline Phosphatase 55 38 - 126 U/L   Total Bilirubin 1.8 (H) 0.3 - 1.2 mg/dL   GFR calc non Af Amer >60 >60 mL/min   GFR calc Af Amer >60 >60 mL/min   Anion gap 12 5 - 15  C-reactive protein  Result Value Ref Range   CRP 6.0 (H) <1.0 mg/dL  Fibrin derivatives D-Dimer ([facility] only)  Result Value Ref Range   Fibrin derivatives D-dimer ([facility]) 949.78 (H) 0.00 - 499.00 ng/mL (FEU)  Ferritin  Result Value Ref Range   Ferritin 172 24 - 336 ng/mL  Comprehensive metabolic panel  Result Value Ref Range   Sodium 140 135 - 145 mmol/L   Potassium 3.3 (L) 3.5 - 5.1 mmol/L   Chloride 99 98 - 111 mmol/L   CO2 30 22 - 32 mmol/L   Glucose, Bld 103 (H) 70 - 99 mg/dL   BUN 28 (H) 8 - 23 mg/dL   Creatinine, Ser 0.71 0.61 - 1.24 mg/dL   Calcium 8.6 (L) 8.9 - 10.3 mg/dL   Total Protein 7.3 6.5 - 8.1 g/dL   Albumin 3.4 (L) 3.5 - 5.0 g/dL   AST 52 (H) 15 - 41 U/L   ALT 8 0 - 44 U/L   Alkaline Phosphatase 50 38 - 126 U/L   Total Bilirubin 1.9 (H) 0.3 - 1.2 mg/dL   GFR calc non Af  Amer >60 >60 mL/min   GFR calc Af Amer >60 >60 mL/min   Anion gap 11 5 - 15  C-reactive protein  Result Value Ref Range   CRP 6.0 (H) <1.0 mg/dL  Fibrin derivatives D-Dimer ([facility] only)  Result Value Ref Range   Fibrin derivatives D-dimer ([facility]) 880.40 (H) 0.00 - 499.00 ng/mL (FEU)  Ferritin  Result Value Ref Range   Ferritin 197 24 - 336 ng/mL  Comprehensive metabolic panel  Result Value Ref Range   Sodium 141 135 - 145 mmol/L   Potassium 4.0 3.5 - 5.1 mmol/L   Chloride 102 98 - 111 mmol/L   CO2 27 22 - 32 mmol/L   Glucose, Bld 115 (H) 70 - 99 mg/dL   BUN 34 (H) 8 - 23 mg/dL   Creatinine, Ser 0.83 0.61 - 1.24 mg/dL   Calcium 9.1 8.9 - 10.3 mg/dL   Total Protein 7.4 6.5 - 8.1 g/dL   Albumin 3.5 3.5 - 5.0 g/dL   AST 45 (H) 15 - 41 U/L   ALT 9 0 - 44 U/L   Alkaline Phosphatase 56 38 - 126 U/L   Total Bilirubin 1.9 (H) 0.3 - 1.2 mg/dL   GFR calc non Af Amer >60 >60 mL/min   GFR calc Af Amer >60 >60 mL/min   Anion gap 12 5 - 15  Type and screen [facility]  Result Value Ref Range   ABO/RH(D) O POS    Antibody Screen NEG    Sample Expiration      10/11/2019,2359 Performed at [facility], [address] [place], [place] [address]   Troponin I (High Sensitivity)  Result Value Ref Range   Troponin I (High Sensitivity) 15 <18 ng/L  Troponin I (High Sensitivity)  Result Value Ref Range   Troponin I (High Sensitivity) 13 <18 ng/L      Assessment & Plan:   Problem List Items Addressed This Visit  Lewy body dementia without behavioral disturbance (HCC)   Relevant Medications   OLANZapine (ZYPREXA) 5 MG tablet   Essential hypertension - Primary   Relevant Medications   amLODipine (NORVASC) 2.5 MG tablet    Other Visit Diagnoses    Acute delirium       Needs flu shot       Relevant Orders   Flu Vaccine QUAD High Dose(Fluad) (Completed)      #Hospital Follow-up reviewed discharge summary and SNF records. He is doing well currently  with gradual improvement Home Health services continue  Med changes today Order new BP med Amlodipine 2.5mg  to match course in hospital/SNF DC old rx Enalapril, HCTZ - Monitor BP closely - Resume Furosemide 20mg  every other day for swelling  HOLD/DC Risperidone, will not re order from hospital  Sent new rx Olanzapine (Zyprexa) 5mg  nightly PRN agitation and can transition to this for sleep PRN if persistent difficulty with agitation at night.  Off Trazodone still.    Meds ordered this encounter  Medications  . amLODipine (NORVASC) 2.5 MG tablet    Sig: Take 1 tablet (2.5 mg total) by mouth daily.    Dispense:  30 tablet    Refill:  2  . OLANZapine (ZYPREXA) 5 MG tablet    Sig: Take 1 tablet (5 mg total) by mouth at bedtime as needed (agitation).    Dispense:  30 tablet    Refill:  0    Follow up plan: Return in about 3 months (around 02/10/2020) for 3 month follow-up HTN, back pain, weakness.   [name], [facility] 11/10/2019, 2:49 PM

## 2019-11-12 ENCOUNTER — Ambulatory Visit: Payer: Self-pay | Admitting: Pharmacist

## 2019-11-12 ENCOUNTER — Telehealth: Payer: Self-pay | Admitting: Family Medicine

## 2019-11-12 DIAGNOSIS — I10 Essential (primary) hypertension: Secondary | ICD-10-CM

## 2019-11-12 DIAGNOSIS — F0280 Dementia in other diseases classified elsewhere without behavioral disturbance: Secondary | ICD-10-CM

## 2019-11-12 NOTE — Telephone Encounter (Signed)
   SF 11/12/2019   Name: Randy [name]   MRN: [id number]   DOB: 1934/06/22   AGE: 85 y.o.   GENDER: male   PCP [name], DO.      SF 11/12/2019   Name: Randy [name]   MRN: [id number]   DOB: [birth year]   AGE: 85 y.o.   GENDER: male   PCP [name], DO.    Spoke with patient's daughter [name] today regarding [employer] Referral for ramp, Meals on Wheels, and [place]. [name] stated that her father needs a ramp for his home to be more accessible. Informed [name] that Care Guide will look into finding resources that will able to assist with building a ramp. [name] also asked about [employer] [facility] of [place]. Informed [name] that the wait-list for [employer] Whe[employer] is about 5 - 6 months long and that if she may have to give them a call at [phone] to see if there are any other options that maybe available for her father. Also let Ms. Nevada Crane know that the [facility] has a year long wait-list. She stated that she still would like to add her father to the list. Informed Ms. [place] Crane that Mr. Hymas will be added to the [facility] list and that Care Guide will give her a call back within the week to follow up. Ms. Nevada Crane stated understanding.   [place], Care Management Phone: [phone] Email: [email or web address]  [place], Care Management Phone: [phone] Email: [email or web address]

## 2019-11-12 NOTE — Chronic Care Management (AMB) (Signed)
Chronic Care Management   Follow Up Note   11/12/2019 Name: Randy [name] MRN: [id number] DOB: [birth year]  Referred by: [name], DO Reason for referral : Chronic Care Management (Patient Phone Call)   Randy [name] is a 85 y.o. year old male who is a primary care patient of [name], DO. The CCM team was consulted for assistance with chronic disease management and care coordination needs.    I reached out to patient's daughter/caregiver, Randy [name], by phone today.    Review of patient status, including review of consultants reports, relevant laboratory and other test results, and collaboration with appropriate care team members and the patient's provider was performed as part of comprehensive patient evaluation and provision of chronic care management services.    SDOH (Social Determinants of Health) assessments performed: No See Care Plan activities for detailed interventions related to [facility])     Outpatient Encounter Medications as of 11/12/2019  Medication Sig  . amLODipine (NORVASC) 2.5 MG tablet Take 1 tablet (2.5 mg total) by mouth daily.  . carbidopa-levodopa (SINEMET IR) 25-100 MG tablet Take 1 tablet by mouth 4 (four) times daily -  with meals and at bedtime.  . cholecalciferol (VITAMIN D) 1000 units tablet Take 1,000 Units by mouth daily.  . diclofenac Sodium (VOLTAREN) 1 % GEL Apply 2 g topically 4 (four) times daily as needed. To knee arthritis pain  . fluticasone (FLONASE) 50 MCG/ACT nasal spray Place 2 sprays into both nostrils daily as needed for allergies.   . furosemide (LASIX) 20 MG tablet Take 1 tablet (20 mg total) by mouth every other day.  . melatonin 5 MG TABS Take 5 mg by mouth at bedtime as needed (sleep).  . Multiple Vitamin (MULTIVITAMIN WITH MINERALS) TABS tablet Take 1 tablet by mouth daily.  . Omega-3 Fatty Acids (FISH OIL) 1200 MG CAPS Take 1,200 mg by mouth 2 (two) times daily.   [name] omeprazole (PRILOSEC) 20 MG capsule Take  20 mg by mouth daily.  . vitamin C (ASCORBIC ACID) 500 MG tablet Take 500 mg by mouth daily.  [name] zinc sulfate 220 (50 Zn) MG capsule Take 1 capsule (220 mg total) by mouth daily.  . Coenzyme Q10 (CO Q 10) 100 MG CAPS Take 100 mg by mouth daily.   [name] docusate sodium (COLACE) 100 MG capsule Take 1 capsule (100 mg total) by mouth 2 (two) times daily. (Patient not taking: Reported on 11/12/2019)  . OLANZapine (ZYPREXA) 5 MG tablet Take 1 tablet (5 mg total) by mouth at bedtime as needed (agitation). (Patient not taking: Reported on 11/12/2019)   No facility-administered encounter medications on file as of 11/12/2019.    Goals Addressed            This Visit's Progress   . PharmD - Medication Management       CARE PLAN ENTRY (see longitudinal plan of care for additional care plan information)  Current Barriers:    . Chronic Disease Management support, education, and care coordination needs related to hypertension, osteoarthritis, hyperlipidemia, insomnia and memory loss . Cognitive Deficits  Pharmacist Clinical Goal(s):  [name] Over the next 30 days, patient/caregiver will work with CM Pharmacist to address needs related to medication management  Interventions: . Perform chart review . Follow up regarding blood pressure monitoring and control ? Reports patient taking: ? Amlodipine 2.5 mg daily ? Furosemide 20 mg - every other day (on Mon, Wed & Fri) - Restarted Today ? Confirms removed/patient not taking  enalapril or HCTZ ? Unable to review BP readings today ? Advise to restart home BP monitoring.  ? Reports patient having difficulty with monitoring himself, but that she and her mom can assist patient with upper arm BP monitor ? Reports patient still has swelling on and off in his legs. Notes patient just restarted furosemide today ? Encourage to aid patient with continuing to check BP, keep log and contact office for readings outside of provided parameters, worsening swelling or new  symptoms . Discuss strategies to aid with medication adherence ? Reports patient continues having difficulty with consistently remembering fourth dose of carbidopa/levodopa.  ? Note patient using weekly pillbox with slots for four times daily dosing ? Again encourage to consider using medication alarm to help patient remember throughout the day . Coordination of care: Again encourage caregiver to reschedule appointment with Neurology . Schedule appointment with caregiver to review BP monitoring results  Patient Self Care Activities:  . Patient to take medications as directed with assistance of daughter o Daughter aids Mr. Clink with filling his weekly pillbox and monitoring for adherence . Attends all scheduled provider appointments . Calls pharmacy for medication refills . Calls provider office for new concerns or questions . Patient/daughter to keep log of BP readings   Please see past updates related to this goal by clicking on the "Past Updates" button in the selected goal         Plan  Telephone follow up appointment with care management team member scheduled for: 11/19 at 9:45 am  [name], PharmD, [place] [phone]

## 2019-11-12 NOTE — Patient Instructions (Signed)
Thank you allowing the Chronic Care Management Team to be a part of your care! It was a pleasure speaking with you today!     CCM (Chronic Care Management) Team    Noreene Larsson RN, MSN, CCM Nurse Care Coordinator  [phone]   [name] PharmD  Clinical Pharmacist  [phone]   Eula Fried LCSW Clinical Social Worker [phone]  Visit Information  Goals Addressed            This Visit's Progress   . PharmD - Medication Management       CARE PLAN ENTRY (see longitudinal plan of care for additional care plan information)  Current Barriers:    . Chronic Disease Management support, education, and care coordination needs related to hypertension, osteoarthritis, hyperlipidemia, insomnia and memory loss . Cognitive Deficits  Pharmacist Clinical Goal(s):  [name] Over the next 30 days, patient/caregiver will work with CM Pharmacist to address needs related to medication management  Interventions: . Perform chart review . Follow up regarding blood pressure monitoring and control ? Reports patient taking: ? Amlodipine 2.5 mg daily ? Furosemide 20 mg - every other day (on Mon, Wed & Fri) - Restarted Today ? Confirms removed/patient not taking enalapril or HCTZ ? Unable to review BP readings today ? Advise to restart home BP monitoring.  ? Reports patient having difficulty with monitoring himself, but that she and her mom can assist patient with upper arm BP monitor ? Reports patient still has swelling on and off in his legs. Notes patient just restarted furosemide today ? Encourage to aid patient with continuing to check BP, keep log and contact office for readings outside of provided parameters, worsening swelling or new symptoms . Discuss strategies to aid with medication adherence ? Reports patient continues having difficulty with consistently remembering fourth dose of carbidopa/levodopa.  ? Note patient using weekly pillbox with slots for four times daily  dosing ? Again encourage to consider using medication alarm to help patient remember throughout the day . Coordination of care: Again encourage caregiver to reschedule appointment with Neurology . Schedule appointment with caregiver to review BP monitoring results  Patient Self Care Activities:  . Patient to take medications as directed with assistance of daughter o Daughter aids Mr. Cwynar with filling his weekly pillbox and monitoring for adherence . Attends all scheduled provider appointments . Calls pharmacy for medication refills . Calls provider office for new concerns or questions . Patient/daughter to keep log of BP readings   Please see past updates related to this goal by clicking on the "Past Updates" button in the selected goal         Patient verbalizes understanding of instructions provided today.   Telephone follow up appointment with care management team member scheduled for:  11/19 at 9:45 am  [name], PharmD, [place] [phone]

## 2019-11-15 NOTE — Telephone Encounter (Signed)
   SF 11/15/2019   Name: Randy [name]   MRN: [id number]   DOB: July 30, 1934   AGE: 85 y.o.   GENDER: male   PCP [name], DO.   Spoke with patient's daughter [name] today regarding referral. Informed Ms. Hall that Care Guide has completed referral to [place] and also spoke with Home Care Providers to add [name] to the [facility] list. Explained to [name] that it will take about 2-3 weeks for [facility] to give her a call back and that the [facility] wait list is about a year long. [name] stated understanding. Asked Ms. [place] Crane if she was able to contact [employer] Whe[employer]. She stated that she has not had a chance to give them a call, but she will try to call them within a couple of days to see if her parents can receive Meals on Wheels.  Informed Ms. Hall that Care Guide will give her a follow up call in a week. Ms. Nevada Crane stated understanding.    [place], Care Management Phone: [phone] Email: [email or web address]

## 2019-11-18 ENCOUNTER — Other Ambulatory Visit: Payer: Self-pay

## 2019-11-26 ENCOUNTER — Ambulatory Visit: Payer: Self-pay

## 2019-11-26 ENCOUNTER — Ambulatory Visit: Payer: Self-pay | Admitting: Pharmacist

## 2019-11-26 DIAGNOSIS — R600 Localized edema: Secondary | ICD-10-CM

## 2019-11-26 DIAGNOSIS — I10 Essential (primary) hypertension: Secondary | ICD-10-CM

## 2019-11-26 NOTE — Patient Instructions (Signed)
Thank you allowing the Chronic Care Management Team to be a part of your care! It was a pleasure speaking with you today!     CCM (Chronic Care Management) Team    Noreene Larsson RN, MSN, CCM Nurse Care Coordinator  [phone]   [name] PharmD  Clinical Pharmacist  [phone]   Eula Fried LCSW Clinical Social Worker [phone]  Visit Information  Goals Addressed            This Visit's Progress   . PharmD - Medication Management       CARE PLAN ENTRY (see longitudinal plan of care for additional care plan information)  Current Barriers:    . Chronic Disease Management support, education, and care coordination needs related to hypertension, osteoarthritis, hyperlipidemia, insomnia and memory loss . Cognitive Deficits  Pharmacist Clinical Goal(s):  [name] Over the next 30 days, patient/caregiver will work with CM Pharmacist to address needs related to medication management  Interventions: . Daughter reports that she is planning to call office this morning about patient's stomach. Reports "something is not right with him" related to his stomach area, but having trouble getting more information from patient other than his stomach is "just yuck". Reports patient seems to be in pain when he lays down. Reports also having some diarrhea o Provide office phone number and encourage to call now . Follow up regarding blood pressure monitoring and control ? Reports patient taking: ? Amlodipine 2.5 mg daily ? Furosemide 20 mg - every other day (on Mon, Wed & Fri) - Restarted Today ? Unable to review specific BP readings today; reports forgot to bring log today ? Recealls SBP running mostly in 150s, with some readings in 130s ? Reports patient having difficulty with monitoring himself, but that she can assist patient with upper arm BP monitor ? Reports legs remain swollen. Confirms taking furosemide as directed. States that her mom says his toes look blue sometimes and  patient sometimes short of breath walking around house.  ? Advise daughter to provide this information as well when calling office today ? Reports patient elevating legs some, but not using compression stockings due to physical/memory difficulty using tools/strategies that previously worked to getting them on. ? Encourage to start to aid patient with checking BP, keep log and contact office for readings outside of provided parameters, worsening swelling or new symptoms ? Schedule appointment to follow up to review readings . Discuss strategies to aid with medication adherence ? Reports patient continues having difficulty with consistently remembering fourth dose of carbidopa/levodopa.  ? Note patient using weekly pillbox with slots for four times daily dosing ? Reports obtained talking medication alarm and will start using . Review chart to confirm patient reached office today for concerns about abdominal pain, shortness of breath and toes. Note patient advised by RN to go to ER for evaluation . Collaborate with CCM Nurse Case Manager . Will send coordination of care message to PCP  Patient Self Care Activities:  . Patient to take medications as directed with assistance of daughter o Daughter aids Mr. Govan with filling his weekly pillbox and monitoring for adherence . Attends all scheduled provider appointments o Next appointment with Neurology on 12/9 . Calls pharmacy for medication refills . Calls provider office for new concerns or questions . Patient/daughter to keep log of BP readings   Please see past updates related to this goal by clicking on the "Past Updates" button in the selected goal  The patient verbalized understanding of instructions, educational materials, and care plan provided today and declined offer to receive copy of patient instructions, educational materials, and care plan.   Telephone follow up appointment with care management team member scheduled for:  12/1 at 12 pm  [name], PharmD, [place] [phone]

## 2019-11-26 NOTE — Telephone Encounter (Signed)
Patients daughter caled and say that her father has had swelling in his feet and legs that sometimes turns his toes purple. She states that it will come and go but always swelling in his feet and legs.  She states that the swelling in his legs goes only to his knees. She states that now per her mother he is having SOB. Daughter states that she feels its only when up and about.  She states he has a bed that has a raised head and has always slept with his head elevated.  She states that her mother staets he grabs at his right abdomin when he lays back.  Daughter states that her dad has always had a large abdomin and feels that it looks normal. Last night her father stated that he didn't feel good but he was unable to explain any more that he just does not feel well.  Per protocol patient will go to ER for evaluation. Care advice read to daughter.  She verbalized understanding of all information.  Reason for Disposition . [1] MODERATE difficulty breathing (e.g., speaks in phrases, SOB even at rest, pulse 100-120) AND [2] NEW-onset or WORSE than normal  Answer Assessment - Initial Assessment Questions 1. RESPIRATORY STATUS: "Describe your breathing?" (e.g., wheezing, shortness of breath, unable to speak, severe coughing)      Up and about SOB 2. ONSET: "When did this breathing problem begin?"      A week or more 3. PATTERN "Does the difficult breathing come and go, or has it been constant since it started?"     Comes and goes 4. SEVERITY: "How bad is your breathing?" (e.g., mild, moderate, severe)    - MILD: No SOB at rest, mild SOB with walking, speaks normally in sentences, can lay down, no retractions, pulse < 100.    - MODERATE: SOB at rest, SOB with minimal exertion and prefers to sit, cannot lie down flat, speaks in phrases, mild retractions, audible wheezing, pulse 100-120.    - SEVERE: Very SOB at rest, speaks in single words, struggling to breathe, sitting hunched forward, retractions, pulse >  120      moderate 5. RECURRENT SYMPTOM: "Have you had difficulty breathing before?" If Yes, ask: "When was the last time?" and "What happened that time?"      new 6. CARDIAC HISTORY: "Do you have any history of heart disease?" (e.g., heart attack, angina, bypass surgery, angioplasty)      no 7. LUNG HISTORY: "Do you have any history of lung disease?"  (e.g., pulmonary embolus, asthma, emphysema)     no 8. CAUSE: "What do you think is causing the breathing problem?"      unsure 9. OTHER SYMPTOMS: "Do you have any other symptoms? (e.g., dizziness, runny nose, cough, chest pain, fever)     Pain to rt abdomin 10. PREGNANCY: "Is there any chance you are pregnant?" "When was your last menstrual period?"     N/A 11. TRAVEL: "Have you traveled out of the country in the last month?" (e.g., travel history, exposures)      N/A  Protocols used: BREATHING DIFFICULTY-A-AH

## 2019-11-26 NOTE — Chronic Care Management (AMB) (Signed)
Chronic Care Management   Follow Up Note   11/26/2019 Name: Randy [name] MRN: [id number] DOB: 1934-03-12  Referred by: [name], DO Reason for referral : Chronic Care Management (Patient Phone Call)   Randy [name] is a 85 y.o. year old male who is a primary care patient of [name], DO. The CCM team was consulted for assistance with chronic disease management and care coordination needs.    I reached out to patient's daughter/caregiver, Randy [name], by phone today.    Review of patient status, including review of consultants reports, relevant laboratory and other test results, and collaboration with appropriate care team members and the patient's provider was performed as part of comprehensive patient evaluation and provision of chronic care management services.    SDOH (Social Determinants of Health) assessments performed: No See Care Plan activities for detailed interventions related to [facility])     Outpatient Encounter Medications as of 11/26/2019  Medication Sig  . amLODipine (NORVASC) 2.5 MG tablet Take 1 tablet (2.5 mg total) by mouth daily.  . carbidopa-levodopa (SINEMET IR) 25-100 MG tablet Take 1 tablet by mouth 4 (four) times daily -  with meals and at bedtime.  . cholecalciferol (VITAMIN D) 1000 units tablet Take 1,000 Units by mouth daily.  . Coenzyme Q10 (CO Q 10) 100 MG CAPS Take 100 mg by mouth daily.   . diclofenac Sodium (VOLTAREN) 1 % GEL Apply 2 g topically 4 (four) times daily as needed. To knee arthritis pain  . docusate sodium (COLACE) 100 MG capsule Take 1 capsule (100 mg total) by mouth 2 (two) times daily. (Patient not taking: Reported on 11/12/2019)  . fluticasone (FLONASE) 50 MCG/ACT nasal spray Place 2 sprays into both nostrils daily as needed for allergies.   . furosemide (LASIX) 20 MG tablet Take 1 tablet (20 mg total) by mouth every other day.  . melatonin 5 MG TABS Take 5 mg by mouth at bedtime as needed (sleep).  . Multiple  Vitamin (MULTIVITAMIN WITH MINERALS) TABS tablet Take 1 tablet by mouth daily.  [name] OLANZapine (ZYPREXA) 5 MG tablet Take 1 tablet (5 mg total) by mouth at bedtime as needed (agitation). (Patient not taking: Reported on 11/12/2019)  . Omega-3 Fatty Acids (FISH OIL) 1200 MG CAPS Take 1,200 mg by mouth 2 (two) times daily.   [name] omeprazole (PRILOSEC) 20 MG capsule Take 20 mg by mouth daily.  . vitamin C (ASCORBIC ACID) 500 MG tablet Take 500 mg by mouth daily.  [name] zinc sulfate 220 (50 Zn) MG capsule Take 1 capsule (220 mg total) by mouth daily.   No facility-administered encounter medications on file as of 11/26/2019.    Goals Addressed            This Visit's Progress   . PharmD - Medication Management       CARE PLAN ENTRY (see longitudinal plan of care for additional care plan information)  Current Barriers:    . Chronic Disease Management support, education, and care coordination needs related to hypertension, osteoarthritis, hyperlipidemia, insomnia and memory loss . Cognitive Deficits  Pharmacist Clinical Goal(s):  [name] Over the next 30 days, patient/caregiver will work with CM Pharmacist to address needs related to medication management  Interventions: . Daughter reports that she is planning to call office this morning about patient's stomach. Reports "something is not right with him" related to his stomach area, but having trouble getting more information from patient other than his stomach is "just yuck". Reports  patient seems to be in pain when he lays down. Reports also having some diarrhea o Provide office phone number and encourage to call now . Follow up regarding blood pressure monitoring and control ? Reports patient taking: ? Amlodipine 2.5 mg daily ? Furosemide 20 mg - every other day (on Mon, Wed & Fri) - Restarted Today ? Unable to review specific BP readings today; reports forgot to bring log today ? Recalls recent readings running primarily 150s (sometimes 130s)/80s-90s   ? Reports patient having difficulty with monitoring himself, but that she and her mom can assist patient with upper arm BP monitor ? Reports legs remain swollen. Confirms taking furosemide as directed. States that her mom says his toes look blue sometimes and patient sometimes short of breath walking around house.  ? Advise daughter to provide this information as well when calling office ? Reports patient elevating legs some, but not using compression stockings due to physical/memory difficulty using tools/strategies that previously worked to getting them on. ? Encourage to start to aid patient with checking BP, keep log and contact office for readings outside of provided parameters, worsening swelling or new symptoms ? Schedule appointment to follow up to review readings . Discuss strategies to aid with medication adherence ? Reports patient continues having difficulty with consistently remembering fourth dose of carbidopa/levodopa.  ? Note patient using weekly pillbox with slots for four times daily dosing ? Reports obtained talking medication alarm, but has not yet set it up ?  [name] Review chart to confirm patient reached office for concerns about abdominal pain, shortness of breath and toes and advised by RN to go to ER for evaluation . Collaborate with CCM Nurse Case Manager . Schedule appointment with caregiver to review BP monitoring results  Patient Self Care Activities:  . Patient to take medications as directed with assistance of daughter o Daughter aids Mr. Mcgue with filling his weekly pillbox and monitoring for adherence . Attends all scheduled provider appointments o Next appointment with Neurology on 12/9 . Calls pharmacy for medication refills . Calls provider office for new concerns or questions . Patient/daughter to keep log of BP readings   Please see past updates related to this goal by clicking on the "Past Updates" button in the selected goal          Plan  Telephone follow up appointment with care management team member scheduled for: 12/1 at 12 pm  [name], PharmD, [place] [phone]

## 2019-11-30 ENCOUNTER — Telehealth: Payer: Self-pay | Admitting: Family Medicine

## 2019-11-30 NOTE — Telephone Encounter (Signed)
   SF 11/30/2019   Name: VIRLAN KEMPKER   MRN: [id number]   DOB: 1934-12-31   AGE: 85 y.o.   GENDER: male   PCP [name], DO.   Spoke with Ms. [place] Crane, patient's daughter regarding referral for food, ramp, and home health assistance. Care Guide has added patient to [facility] wait-list for [facility]. Ms. Nevada Crane has contacted [employer] [facility] [place] to receive assistance and Care Guide sent referral to [place]. Spoke with Hollice Gong from [facility] and patient will be receiving a call from their office on 11/30/2019.  No additional needs at this time.   Closing referral pending any other needs of patient.    [place], Care Management Phone: [phone] Email: [email or web address]

## 2019-12-02 ENCOUNTER — Other Ambulatory Visit: Payer: Self-pay | Admitting: Family Medicine

## 2019-12-02 DIAGNOSIS — F0280 Dementia in other diseases classified elsewhere without behavioral disturbance: Secondary | ICD-10-CM

## 2019-12-03 NOTE — Telephone Encounter (Signed)
Requested medication (s) are due for refill today:   Provider to decide  Requested medication (s) are on the active medication list:   Yes  Future visit scheduled:   No   Last ordered: 11/10/2019 #30, R 0  Non delegated refill   Requested Prescriptions  Pending Prescriptions Disp Refills   OLANZapine (ZYPREXA) 5 MG tablet [Pharmacy Med Name: OLANZAPINE 5 MG TABLET] 90 tablet 1    Sig: Take 1 tablet (5 mg total) by mouth at bedtime as needed (agitation).      Not Delegated - Psychiatry:  Antipsychotics - Second Generation (Atypical) - olanzapine Failed - 12/02/2019 10:32 AM      Failed - This refill cannot be delegated      Failed - AST in normal range and within 360 days    AST  Date Value Ref Range Status  10/12/2019 45 (H) 15 - 41 U/L Final  04/17/2015 14 0 - 40 U/L Final          Failed - Last BP in normal range    BP Readings from Last 1 Encounters:  11/10/19 (!) 141/78          Passed - ALT in normal range and within 360 days    ALT  Date Value Ref Range Status  10/12/2019 9 0 - 44 U/L Final  04/17/2015 8 0 - 44 Final          Passed - Valid encounter within last 6 months    Recent Outpatient Visits           3 weeks ago Essential hypertension   [place], DO   1 month ago Patient left without being seen   [place], DO   2 months ago Primary osteoarthritis of right knee   [place], DO   4 months ago Primary osteoarthritis of right knee   [place], DO   10 months ago Tricompartment osteoarthritis of left knee   [place], DO       Future Appointments             In 6 months Diamantina [place], Herbert Seta, MD [place]

## 2019-12-08 ENCOUNTER — Telehealth: Payer: Self-pay | Admitting: Pharmacist

## 2019-12-08 ENCOUNTER — Telehealth: Payer: Self-pay

## 2019-12-08 NOTE — Telephone Encounter (Signed)
  Chronic Care Management   Outreach Note  12/08/2019 Name: Randy [name] MRN: [id number] DOB: 06/01/1934  Referred by: [name], DO Reason for referral : No chief complaint on file.  Receive message from patient's daughter/caregiver, [name], requesting to reschedule today's appointment for patient.  Follow Up Plan: As requested, reschedule telephone follow up appointment for: 12/10 at 9 am  [name], PharmD, [place] [facility] [phone]

## 2019-12-17 ENCOUNTER — Ambulatory Visit: Payer: Medicare Other | Admitting: Pharmacist

## 2019-12-17 ENCOUNTER — Other Ambulatory Visit: Payer: Self-pay | Admitting: Family Medicine

## 2019-12-17 DIAGNOSIS — I10 Essential (primary) hypertension: Secondary | ICD-10-CM

## 2019-12-17 DIAGNOSIS — R600 Localized edema: Secondary | ICD-10-CM

## 2019-12-17 NOTE — Chronic Care Management (AMB) (Signed)
Chronic Care Management   Follow Up Note   12/17/2019 Name: Randy [name] MRN: [id number] DOB: 1934-05-22  Referred by: [name], DO Reason for referral : Chronic Care Management (Caregiver Phone Call)   Randy [name] is a 85 y.o. year old male who is a primary care patient of [name], DO. The CCM team was consulted for assistance with chronic disease management and care coordination needs.    I reached out to patient's daughter/caregiver, Randy [name], by phone today.    Review of patient status, including review of consultants reports, relevant laboratory and other test results, and collaboration with appropriate care team members and the patient's provider was performed as part of comprehensive patient evaluation and provision of chronic care management services.    SDOH (Social Determinants of Health) assessments performed: Yes See Care Plan activities for detailed interventions related to [facility])     Outpatient Encounter Medications as of 12/17/2019  Medication Sig Note  . amLODipine (NORVASC) 2.5 MG tablet Take 1 tablet (2.5 mg total) by mouth daily.   . furosemide (LASIX) 20 MG tablet Take 1 tablet (20 mg total) by mouth every other day. 12/17/2019: Taking on Mondays, Wednesdays and Fridays  . omeprazole (PRILOSEC) 20 MG capsule Take 20 mg by mouth daily.   . rivastigmine (EXELON) 1.5 MG capsule Take 1 capsule by mouth 2 (two) times daily with a meal.   . carbidopa-levodopa (SINEMET IR) 25-100 MG tablet Take 1 tablet by mouth 4 (four) times daily -  with meals and at bedtime.   . cholecalciferol (VITAMIN D) 1000 units tablet Take 1,000 Units by mouth daily.   . Coenzyme Q10 (CO Q 10) 100 MG CAPS Take 100 mg by mouth daily.    . diclofenac Sodium (VOLTAREN) 1 % GEL Apply 2 g topically 4 (four) times daily as needed. To knee arthritis pain   . docusate sodium (COLACE) 100 MG capsule Take 1 capsule (100 mg total) by mouth 2 (two) times daily. (Patient  not taking: Reported on 11/12/2019)   . fluticasone (FLONASE) 50 MCG/ACT nasal spray Place 2 sprays into both nostrils daily as needed for allergies.    . melatonin 5 MG TABS Take 5 mg by mouth at bedtime as needed (sleep).   . Multiple Vitamin (MULTIVITAMIN WITH MINERALS) TABS tablet Take 1 tablet by mouth daily.   [name] OLANZapine (ZYPREXA) 5 MG tablet TAKE 1 TABLET (5 MG TOTAL) BY MOUTH AT BEDTIME AS NEEDED (AGITATION).   . Omega-3 Fatty Acids (FISH OIL) 1200 MG CAPS Take 1,200 mg by mouth 2 (two) times daily.    . vitamin C (ASCORBIC ACID) 500 MG tablet Take 500 mg by mouth daily.   [name] zinc sulfate 220 (50 Zn) MG capsule Take 1 capsule (220 mg total) by mouth daily.    No facility-administered encounter medications on file as of 12/17/2019.    Goals Addressed            This Visit's Progress   . PharmD - Medication Management       CARE PLAN ENTRY (see longitudinal plan of care for additional care plan information)  Current Barriers:    . Chronic Disease Management support, education, and care coordination needs related to hypertension, osteoarthritis, hyperlipidemia, insomnia and memory loss . Cognitive Deficits  Pharmacist Clinical Goal(s):  [name] Over the next 30 days, patient/caregiver will work with CM Pharmacist to address needs related to medication management  Interventions: . Perform chart review. o On 11/19  daughter advised to take patient to ER for evaluation of abdominal pain, shortness of breath and toes (reported sometimes purple) - Unable to find record in chart of patient being evaluated in ER/office for these concerns following this call o On 12/9, patient seen by [facility]rology. Neurologist advised patient to: - Begin rivastigmine 1.5 mg two times a day with meals - Continue sinemet 25/100 mg 1 pill 4 times daily . Follow up with daughter today regarding concerns about abdominal pain, shortness of breath and toes o Reports did not take patient to ED for  further evaluation due to wait time to be seen o Denies observing patient to be short of breath o Reports patient's feet stay swollen and has observed his toes to sometimes appear purple, but only when very swollen - Reports patient no longer able to use compression stockings due to physical/memory difficulty using tools/strategies that previously worked to getting them on. - Reports family encouraging patient to elevate feet, but unable to remember to do on his own o Reports patient not complaining of stomach discomfort, but does grab stomach when goes to lay down o Advise caregiver to call office today to schedule follow up appointment with PCP for further evaluation . Follow up regarding blood pressure monitoring and control ? Reports patient taking: ? Amlodipine 2.5 mg daily ? Furosemide 20 mg - every other day (on Mon, Wed & Fri) - Restarted Today ? Unable to review specific BP readings today; reports misplaced log today ? Recealls SBP running mostly in 140s-150s/70s-80s ? Reports patient having difficulty with monitoring himself, but that she can assist patient with upper arm BP monitor ? Encourage daughtetr to aid patient with checking BP, keep log  . Discuss strategies to aid with medication adherence ? Note patient continues having difficulty with consistently remembering fourth dose of carbidopa/levodopa.  ? Note patient using weekly pillbox with slots for four times daily dosing ? Reports tried to use talking medication alarm with patient, but patient declined to use . Will send coordination of care message to PCP  Patient Self Care Activities:  . Patient to take medications as directed with assistance of daughter o Daughter aids [name] with filling his weekly pillbox and monitoring for adherence . Attends all scheduled provider appointments o Next appointment with Neurology on 12/9 . Calls pharmacy for medication refills . Calls provider office for new concerns or  questions . Patient/daughter to keep log of BP readings   Please see past updates related to this goal by clicking on the "Past Updates" button in the selected goal         Plan  Telephone follow up appointment with care management team member scheduled for: 01/17/2020 at 8:45 am  [name], PharmD, [place] [phone]

## 2019-12-17 NOTE — Patient Instructions (Signed)
Thank you allowing the Chronic Care Management Team to be a part of your care! It was a pleasure speaking with you today!     CCM (Chronic Care Management) Team    Noreene Larsson RN, MSN, CCM Nurse Care Coordinator  [phone]   [name] PharmD  Clinical Pharmacist  [phone]   Eula Fried LCSW Clinical Social Worker [phone]  Visit Information  Goals Addressed            This Visit's Progress   . PharmD - Medication Management       CARE PLAN ENTRY (see longitudinal plan of care for additional care plan information)  Current Barriers:    . Chronic Disease Management support, education, and care coordination needs related to hypertension, osteoarthritis, hyperlipidemia, insomnia and memory loss . Cognitive Deficits  Pharmacist Clinical Goal(s):  [name] Over the next 30 days, patient/caregiver will work with CM Pharmacist to address needs related to medication management  Interventions: . Perform chart review. o On 11/19 daughter advised to take patient to ER for evaluation of abdominal pain, shortness of breath and toes (reported sometimes purple) - Unable to find record in chart of patient being evaluated in ER/office for these concerns following this call o On 12/9, patient seen by [facility]. Neurologist advised patient to: - Begin rivastigmine 1.5 mg two times a day with meals - Continue sinemet 25/100 mg 1 pill 4 times daily . Follow up with daughter today regarding concerns about abdominal pain, shortness of breath and toes o Reports did not take patient to ED for further evaluation due to wait time to be seen o Denies observing patient to be short of breath o Reports patient's feet stay swollen and has observed his toes to sometimes appear purple, but only when very swollen - Reports patient no longer able to use compression stockings due to physical/memory difficulty using tools/strategies that previously worked to getting them  on. - Reports family encouraging patient to elevate feet, but unable to remember to do on his own o Reports patient not complaining of stomach discomfort, but does grab stomach when goes to lay down o Advise caregiver to call office today to schedule follow up appointment with PCP for further evaluation . Follow up regarding blood pressure monitoring and control ? Reports patient taking: ? Amlodipine 2.5 mg daily ? Furosemide 20 mg - every other day (on Mon, Wed & Fri) - Restarted Today ? Unable to review specific BP readings today; reports misplaced log today ? Recealls SBP running mostly in 140s-150s/70s-80s ? Reports patient having difficulty with monitoring himself, but that she can assist patient with upper arm BP monitor ? Encourage daughtetr to aid patient with checking BP, keep log  . Discuss strategies to aid with medication adherence ? Note patient continues having difficulty with consistently remembering fourth dose of carbidopa/levodopa.  ? Note patient using weekly pillbox with slots for four times daily dosing ? Reports tried to use talking medication alarm with patient, but patient declined to use . Will send coordination of care message to PCP  Patient Self Care Activities:  . Patient to take medications as directed with assistance of daughter o Daughter aids Mr. Leaming with filling his weekly pillbox and monitoring for adherence . Attends all scheduled provider appointments o Next appointment with Neurology on 12/9 . Calls pharmacy for medication refills . Calls provider office for new concerns or questions . Patient/daughter to keep log of BP readings   Please see past updates  related to this goal by clicking on the "Past Updates" button in the selected goal         The patient verbalized understanding of instructions, educational materials, and care plan provided today and declined offer to receive copy of patient instructions, educational materials, and care plan.    Telephone follow up appointment with care management team member scheduled for: 01/17/2020 at 8:45 am  [name], PharmD, [place] [phone]

## 2019-12-20 ENCOUNTER — Ambulatory Visit: Payer: Medicare Other | Admitting: Pharmacist

## 2019-12-20 DIAGNOSIS — R600 Localized edema: Secondary | ICD-10-CM

## 2019-12-20 DIAGNOSIS — I10 Essential (primary) hypertension: Secondary | ICD-10-CM

## 2019-12-20 NOTE — Chronic Care Management (AMB) (Signed)
Chronic Care Management   Follow Up Note   12/20/2019 Name: PACO CISLO MRN: [id number] DOB: [birth year]  Referred by: [name], DO Reason for referral : Chronic Care Management (Caregiver Phone Call)   JAYMAR LOEBER is a 85 y.o. year old male who is a primary care patient of [name], DO. The CCM team was consulted for assistance with chronic disease management and care coordination needs.    I reached out to patient's daughter/caregiver, [name], by phone today.    Review of patient status, including review of consultants reports, relevant laboratory and other test results, and collaboration with appropriate care team members and the patient's provider was performed as part of comprehensive patient evaluation and provision of chronic care management services.    SDOH (Social Determinants of Health) assessments performed: No See Care Plan activities for detailed interventions related to [facility])     Outpatient Encounter Medications as of 12/20/2019  Medication Sig Note  . carbidopa-levodopa (SINEMET IR) 25-100 MG tablet Take 1 tablet by mouth 4 (four) times daily -  with meals and at bedtime.   . cholecalciferol (VITAMIN D) 1000 units tablet Take 1,000 Units by mouth daily.   . Coenzyme Q10 (CO Q 10) 100 MG CAPS Take 100 mg by mouth daily.    . diclofenac Sodium (VOLTAREN) 1 % GEL Apply 2 g topically 4 (four) times daily as needed. To knee arthritis pain   . docusate sodium (COLACE) 100 MG capsule Take 1 capsule (100 mg total) by mouth 2 (two) times daily. (Patient not taking: Reported on 11/12/2019)   . fluticasone (FLONASE) 50 MCG/ACT nasal spray Place 2 sprays into both nostrils daily as needed for allergies.    . furosemide (LASIX) 20 MG tablet Take 1 tablet (20 mg total) by mouth every other day. 12/17/2019: Taking on Mondays, Wednesdays and Fridays  . melatonin 5 MG TABS Take 5 mg by mouth at bedtime as needed (sleep).   . Multiple Vitamin  (MULTIVITAMIN WITH MINERALS) TABS tablet Take 1 tablet by mouth daily.   [name] OLANZapine (ZYPREXA) 5 MG tablet TAKE 1 TABLET (5 MG TOTAL) BY MOUTH AT BEDTIME AS NEEDED (AGITATION).   . Omega-3 Fatty Acids (FISH OIL) 1200 MG CAPS Take 1,200 mg by mouth 2 (two) times daily.    [name] omeprazole (PRILOSEC) 20 MG capsule Take 20 mg by mouth daily.   . rivastigmine (EXELON) 1.5 MG capsule Take 1 capsule by mouth 2 (two) times daily with a meal.   . vitamin C (ASCORBIC ACID) 500 MG tablet Take 500 mg by mouth daily.   [name] zinc sulfate 220 (50 Zn) MG capsule Take 1 capsule (220 mg total) by mouth daily.    No facility-administered encounter medications on file as of 12/20/2019.    Goals Addressed            This Visit's Progress   . PharmD - Medication Management       CARE PLAN ENTRY (see longitudinal plan of care for additional care plan information)  Current Barriers:    . Chronic Disease Management support, education, and care coordination needs related to hypertension, osteoarthritis, hyperlipidemia, insomnia and memory loss . Cognitive Deficits  Pharmacist Clinical Goal(s):  [name] Over the next 30 days, patient/caregiver will work with CM Pharmacist to address needs related to medication management  Interventions: . Collaborated with PCP regarding patient's blood pressure medication management and update from daughter of patient's lower leg swelling. o Note that amlodipine can  contribute to peripheral edema. o PCP advises patient to discontinue amlodipine, monitor home BP and then follow up with CM Pharmacist/office with BP readings . Follow up with patient's daughter/caregiver, [name], today. o Daughter verbalizes understanding of plan from PCP for patient to stop amlodipine 2.5 mg, monitor home BP and then follow up to report BP readings o Schedule appointment for next week to review readings o Counsel caregiver if readings persistently >140/90 or new symptoms to notify office  sooner . Again advise caregiver to call office today to schedule follow up appointment with PCP for further evaluation of abdomen  Patient Self Care Activities:  . Patient to take medications as directed with assistance of daughter o Daughter aids Mr. Downs with filling his weekly pillbox and monitoring for adherence . Attends all scheduled provider appointments . Calls pharmacy for medication refills . Calls provider office for new concerns or questions . Patient/daughter to keep log of BP readings   Please see past updates related to this goal by clicking on the "Past Updates" button in the selected goal         Plan  Telephone follow up appointment with care management team member scheduled for: 12/20 at 9 am  [name], PharmD, [place] [phone]

## 2019-12-20 NOTE — Patient Instructions (Signed)
Thank you allowing the Chronic Care Management Team to be a part of your care! It was a pleasure speaking with you today!     CCM (Chronic Care Management) Team    Noreene Larsson RN, MSN, CCM Nurse Care Coordinator  [phone]   [name] PharmD  Clinical Pharmacist  [phone]   Eula Fried LCSW Clinical Social Worker [phone]  Visit Information  Goals Addressed            This Visit's Progress   . PharmD - Medication Management       CARE PLAN ENTRY (see longitudinal plan of care for additional care plan information)  Current Barriers:    . Chronic Disease Management support, education, and care coordination needs related to hypertension, osteoarthritis, hyperlipidemia, insomnia and memory loss . Cognitive Deficits  Pharmacist Clinical Goal(s):  [name] Over the next 30 days, patient/caregiver will work with CM Pharmacist to address needs related to medication management  Interventions: . Collaborated with PCP regarding patient's blood pressure medication management and update from daughter of patient's lower leg swelling. o Note that amlodipine can contribute to peripheral edema. o PCP advises patient to discontinue amlodipine, monitor home BP and then follow up with CM Pharmacist/office with BP readings . Follow up with patient's daughter/caregiver, [name], today. o Daughter verbalizes understanding of plan from PCP for patient to stop amlodipine 2.5 mg, monitor home BP and then follow up to report BP readings o Schedule appointment for next week to review readings o Counsel caregiver if readings persistently >140/90 or new symptoms to notify office sooner . Again advise caregiver to call office today to schedule follow up appointment with PCP for further evaluation of abdomen  Patient Self Care Activities:  . Patient to take medications as directed with assistance of daughter o Daughter aids Mr. Borre with filling his weekly pillbox and monitoring for  adherence . Attends all scheduled provider appointments . Calls pharmacy for medication refills . Calls provider office for new concerns or questions . Patient/daughter to keep log of BP readings   Please see past updates related to this goal by clicking on the "Past Updates" button in the selected goal         The patient verbalized understanding of instructions, educational materials, and care plan provided today and declined offer to receive copy of patient instructions, educational materials, and care plan.   Telephone follow up appointment with care management team member scheduled for: 12/20 at 43 am  [name], PharmD, [place] [phone]

## 2019-12-23 ENCOUNTER — Other Ambulatory Visit: Payer: Self-pay | Admitting: Family Medicine

## 2019-12-23 DIAGNOSIS — I10 Essential (primary) hypertension: Secondary | ICD-10-CM

## 2019-12-27 ENCOUNTER — Ambulatory Visit: Payer: Self-pay | Admitting: Pharmacist

## 2019-12-27 DIAGNOSIS — I10 Essential (primary) hypertension: Secondary | ICD-10-CM

## 2019-12-27 DIAGNOSIS — R600 Localized edema: Secondary | ICD-10-CM

## 2019-12-27 NOTE — Patient Instructions (Signed)
Thank you allowing the Chronic Care Management Team to be a part of your care! It was a pleasure speaking with you today!     CCM (Chronic Care Management) Team    Noreene Larsson RN, MSN, CCM Nurse Care Coordinator  [phone]   [name] PharmD  Clinical Pharmacist  [phone]   Eula Fried LCSW Clinical Social Worker [phone]  Visit Information  Goals Addressed            This Visit's Progress   . PharmD - Medication Management       CARE PLAN ENTRY (see longitudinal plan of care for additional care plan information)  Current Barriers:    . Chronic Disease Management support, education, and care coordination needs related to hypertension, osteoarthritis, hyperlipidemia, insomnia and memory loss . Cognitive Deficits  Pharmacist Clinical Goal(s):  [name] Over the next 30 days, patient/caregiver will work with CM Pharmacist to address needs related to medication management  Interventions:  Hypertension . Follow up with patient's daughter/caregiver, [name], as scheduled to review BP readings o Daughter denies checking patient's BP over past week o Confirms she did stop patient's amlodipine 2.5 mg as directed by PCP.  - Confirms patient has continued to take furosemide 20 mg on Mondays, Wednesdays and Fridays o Discuss rational for/importance of BP monitoring and follow up. o Reschedule appointment for next week to review readings o Counsel caregiver if readings persistently >140/90 or new symptoms to notify office sooner o Reports patient's legs remain swollen, but patient elevating legs more with reminders from family . Encourage caregiver to call office today to schedule follow up appointment with PCP for further evaluation of abdomen  Patient Self Care Activities:  . Patient to take medications as directed with assistance of daughter o Daughter aids Mr. Mittelstaedt with filling his weekly pillbox and monitoring for adherence . Attends all scheduled  provider appointments . Calls pharmacy for medication refills . Calls provider office for new concerns or questions . Patient/daughter to keep log of BP readings   Please see past updates related to this goal by clicking on the "Past Updates" button in the selected goal         The patient verbalized understanding of instructions, educational materials, and care plan provided today and declined offer to receive copy of patient instructions, educational materials, and care plan.   Telephone follow up appointment with care management team member scheduled for: 12/27 at 8:45 am  [name], PharmD, [place] [phone]

## 2019-12-27 NOTE — Chronic Care Management (AMB) (Signed)
Chronic Care Management   Follow Up Note   12/27/2019 Name: Randy [name] MRN: [id number] DOB: March 15, 1934  Referred by: [name], DO Reason for referral : Chronic Care Management (Caregiver Phone Call)   Randy [name] is a 85 y.o. year old male who is a primary care patient of [name], DO. The CCM team was consulted for assistance with chronic disease management and care coordination needs.    I reached out to patient's daughter/caregiver, [name], by phone today.    Review of patient status, including review of consultants reports, relevant laboratory and other test results, and collaboration with appropriate care team members and the patient's provider was performed as part of comprehensive patient evaluation and provision of chronic care management services.    SDOH (Social Determinants of Health) assessments performed: No See Care Plan activities for detailed interventions related to [facility])     Outpatient Encounter Medications as of 12/27/2019  Medication Sig Note  . carbidopa-levodopa (SINEMET IR) 25-100 MG tablet Take 1 tablet by mouth 4 (four) times daily -  with meals and at bedtime.   . cholecalciferol (VITAMIN D) 1000 units tablet Take 1,000 Units by mouth daily.   . Coenzyme Q10 (CO Q 10) 100 MG CAPS Take 100 mg by mouth daily.    . diclofenac Sodium (VOLTAREN) 1 % GEL Apply 2 g topically 4 (four) times daily as needed. To knee arthritis pain   . docusate sodium (COLACE) 100 MG capsule Take 1 capsule (100 mg total) by mouth 2 (two) times daily. (Patient not taking: Reported on 11/12/2019)   . fluticasone (FLONASE) 50 MCG/ACT nasal spray Place 2 sprays into both nostrils daily as needed for allergies.    . furosemide (LASIX) 20 MG tablet Take 1 tablet (20 mg total) by mouth every other day. 12/17/2019: Taking on Mondays, Wednesdays and Fridays  . melatonin 5 MG TABS Take 5 mg by mouth at bedtime as needed (sleep).   . Multiple Vitamin  (MULTIVITAMIN WITH MINERALS) TABS tablet Take 1 tablet by mouth daily.   [name] OLANZapine (ZYPREXA) 5 MG tablet TAKE 1 TABLET (5 MG TOTAL) BY MOUTH AT BEDTIME AS NEEDED (AGITATION).   . Omega-3 Fatty Acids (FISH OIL) 1200 MG CAPS Take 1,200 mg by mouth 2 (two) times daily.    [name] omeprazole (PRILOSEC) 20 MG capsule Take 20 mg by mouth daily.   . rivastigmine (EXELON) 1.5 MG capsule Take 1 capsule by mouth 2 (two) times daily with a meal.   . vitamin C (ASCORBIC ACID) 500 MG tablet Take 500 mg by mouth daily.   [name] zinc sulfate 220 (50 Zn) MG capsule Take 1 capsule (220 mg total) by mouth daily.    No facility-administered encounter medications on file as of 12/27/2019.    Goals Addressed            This Visit's Progress   . PharmD - Medication Management       CARE PLAN ENTRY (see longitudinal plan of care for additional care plan information)  Current Barriers:    . Chronic Disease Management support, education, and care coordination needs related to hypertension, osteoarthritis, hyperlipidemia, insomnia and memory loss . Cognitive Deficits  Pharmacist Clinical Goal(s):  [name] Over the next 30 days, patient/caregiver will work with CM Pharmacist to address needs related to medication management  Interventions:  Hypertension . Follow up with patient's daughter/caregiver, [name], as scheduled to review BP readings o Daughter denies checking patient's BP over past  week o Confirms she did stop patient's amlodipine 2.5 mg as directed by PCP.  - Confirms patient has continued to take furosemide 20 mg on Mondays, Wednesdays and Fridays o Discuss rational for/importance of BP monitoring and follow up. o Reschedule appointment for next week to review readings o Counsel caregiver if readings persistently >140/90 or new symptoms to notify office sooner o Reports patient's legs remain swollen, but patient elevating legs more with reminders from family . Encourage caregiver to call office today  to schedule follow up appointment with PCP for further evaluation of abdomen  Patient Self Care Activities:  . Patient to take medications as directed with assistance of daughter o Daughter aids Mr. Cazeau with filling his weekly pillbox and monitoring for adherence . Attends all scheduled provider appointments . Calls pharmacy for medication refills . Calls provider office for new concerns or questions . Patient/daughter to keep log of BP readings   Please see past updates related to this goal by clicking on the "Past Updates" button in the selected goal         Plan  Telephone follow up appointment with care management team member scheduled for: 12/27 at 8:45 am  [name], PharmD, [place] [phone]

## 2019-12-28 ENCOUNTER — Ambulatory Visit: Payer: Self-pay | Admitting: Licensed Clinical Social Worker

## 2019-12-28 DIAGNOSIS — Z748 Other problems related to care provider dependency: Secondary | ICD-10-CM

## 2019-12-28 DIAGNOSIS — R413 Other amnesia: Secondary | ICD-10-CM

## 2019-12-28 DIAGNOSIS — R531 Weakness: Secondary | ICD-10-CM

## 2019-12-28 DIAGNOSIS — R600 Localized edema: Secondary | ICD-10-CM

## 2019-12-28 DIAGNOSIS — I10 Essential (primary) hypertension: Secondary | ICD-10-CM

## 2019-12-28 DIAGNOSIS — F0280 Dementia in other diseases classified elsewhere without behavioral disturbance: Secondary | ICD-10-CM

## 2019-12-28 NOTE — Chronic Care Management (AMB) (Signed)
Chronic Care Management    Clinical Social Work Follow Up Note  12/28/2019 Name: Randy [name] MRN: [id number] DOB: [birth year]  Randy [name] is a 85 y.o. year old male who is a primary care patient of [name], DO. The CCM team was consulted for assistance with Level of Care Concerns.   Review of patient status, including review of consultants reports, other relevant assessments, and collaboration with appropriate care team members and the patient's provider was performed as part of comprehensive patient evaluation and provision of chronic care management services.    SDOH (Social Determinants of Health) assessments performed: Yes    Outpatient Encounter Medications as of 12/28/2019  Medication Sig Note  . carbidopa-levodopa (SINEMET IR) 25-100 MG tablet Take 1 tablet by mouth 4 (four) times daily -  with meals and at bedtime.   . cholecalciferol (VITAMIN D) 1000 units tablet Take 1,000 Units by mouth daily.   . Coenzyme Q10 (CO Q 10) 100 MG CAPS Take 100 mg by mouth daily.    . diclofenac Sodium (VOLTAREN) 1 % GEL Apply 2 g topically 4 (four) times daily as needed. To knee arthritis pain   . docusate sodium (COLACE) 100 MG capsule Take 1 capsule (100 mg total) by mouth 2 (two) times daily. (Patient not taking: Reported on 11/12/2019)   . fluticasone (FLONASE) 50 MCG/ACT nasal spray Place 2 sprays into both nostrils daily as needed for allergies.    . furosemide (LASIX) 20 MG tablet Take 1 tablet (20 mg total) by mouth every other day. 12/17/2019: Taking on Mondays, Wednesdays and Fridays  . melatonin 5 MG TABS Take 5 mg by mouth at bedtime as needed (sleep).   . Multiple Vitamin (MULTIVITAMIN WITH MINERALS) TABS tablet Take 1 tablet by mouth daily.   [name] OLANZapine (ZYPREXA) 5 MG tablet TAKE 1 TABLET (5 MG TOTAL) BY MOUTH AT BEDTIME AS NEEDED (AGITATION).   . Omega-3 Fatty Acids (FISH OIL) 1200 MG CAPS Take 1,200 mg by mouth 2 (two) times daily.    [name] omeprazole (PRILOSEC) 20  MG capsule Take 20 mg by mouth daily.   . rivastigmine (EXELON) 1.5 MG capsule Take 1 capsule by mouth 2 (two) times daily with a meal.   . vitamin C (ASCORBIC ACID) 500 MG tablet Take 500 mg by mouth daily.   [name] zinc sulfate 220 (50 Zn) MG capsule Take 1 capsule (220 mg total) by mouth daily.    No facility-administered encounter medications on file as of 12/28/2019.     Goals Addressed    . SW-Manage My Health and Emotions       Timeframe:  Long-Range Goal Priority:  Medium Start Date:  12/28/19                           Expected End Date:  03/27/20                     Follow Up Date- 90 days from 12/28/19   - begin personal counseling - call and visit an old friend - check out volunteer opportunities - join a support group - laugh; watch a funny movie or comedian - learn and use visualization or guided imagery - perform a random act of kindness - practice relaxation or meditation daily - start or continue a personal journal - talk about feelings with a friend, family or spiritual advisor - practice positive thinking and self-talk    Why  is this important?    When you are stressed, down or upset, your body reacts too.   For example, your blood pressure may get higher; you may have a headache or stomachache.   When your emotions get the best of you, your body's ability to fight off cold and flu gets weak.   These steps will help you manage your emotions.     Current Barriers:  . Financial constraints . Level of care concerns . ADL IADL limitations . Social Isolation . Limited access to caregiver . Memory Deficits . Lacks knowledge of community resource: personal care service resources within the area that may benefit patient . Lack of socialization   Clinical Social Work Clinical Goal(s):  [name] Over the next 90 days, client will work with SW to address concerns related to lack of care/support within the home  Interventions: . Patient interviewed and appropriate  assessments performed . LCSW spoke wit patient's daughter on 12/28/19. [name] Provided patient and family with information about personal care service resources within the area. Patient reports that he lives with his spouse and daughter and that his daughter and spouse are his main sources of support that assist him with transportation/meal prep/daily needs.  . Discussed plans with patient for ongoing care management follow up and provided patient with direct contact information for care management team . Advised patient to contact CCM team with any urgent concerns . Assisted patient/caregiver with obtaining information about health plan benefits . Provided education and assistance to client regarding Advanced Directives. . Provided education to patient/caregiver regarding level of care options. [name] LCSW provided self-care education. Patient has ongoing issue with memory and sleep. Patient was encouraged to take medications as prescribed and to ask for help (daughter or spouse) when needed and to try to check blood pressure and record it once a day per PCP's recommendation. Family continue to encourage patient to keep up with blood pressure recordings.  . Patient has been having pain and discomfort in his right knee which has affected his mobility. PT has restarted. Patient has been wearing his compression stockings. . Daughter/patient reports pt is still taking melatonin dose of 10mg  per night but that patent still struggles with insomnia. . Daughter/patient declined needing MOW's at this time.  . Patient admits to feeling lonely at times due to isolation related to COVID-19. LCSW provided family with community resource education for socialization ([place]) Family wish for LCSW to mail this information to their address. LCSW sent out resources on 07/16/19. Emotional support and coping skill education provided as well. Patient was very receptive to this information. UPDATE-  Family report that they contacted [facility] and was informed that they have closed again due to [place]. They are expecting to open up again in October and patient is agreeable to attend center in order to gain socialization. Family was advised to contact local senior centers today on 12/28/19.  . Daughter reports ongoing swelling in patient's legs and feet. Patient is not taking his lasix as he has had a difficult time with making it to the bathroom in time. LCSW encouraged daughter to ensure that patient elevates his legs and wears his compression stockings.  [name] LCSW completed referral for [facility] to assist with ramp resources, Meals on Wheels and to place patient on the wait list for [facility] through [employer]. Family report that they are unable to pay out of pocket for an aide but that he needs this level  of support. Family wish to gain a ramp (for both patient and spouse) at the front of their home in order to prevent future falls.  [name] 1:1 collaboration with PCP regarding development and update of comprehensive plan of care as evidenced by provider attestation and co-signature  . Caregiver (daughter) reports that patient is eager to gain ramp installation so that he can go outside of his porch to get fresh air. Patient is getting a ramp built through [employer].   Patient Self Care Activities:  . Attends all scheduled provider appointments . Calls provider office for new concerns or questions  Please see past updates related to this goal by clicking on the "Past Updates" button in the selected goal      Follow Up Plan: SW will follow up with patient by phone over the next quarter  Eula Fried, [place], MSW, [email or web address] Phone: [phone]

## 2019-12-29 ENCOUNTER — Other Ambulatory Visit: Payer: Self-pay | Admitting: Family Medicine

## 2019-12-29 ENCOUNTER — Ambulatory Visit: Payer: Self-pay | Admitting: Pharmacist

## 2019-12-29 DIAGNOSIS — I10 Essential (primary) hypertension: Secondary | ICD-10-CM

## 2019-12-29 DIAGNOSIS — R600 Localized edema: Secondary | ICD-10-CM

## 2019-12-29 MED ORDER — ENALAPRIL MALEATE 2.5 MG PO TABS: 2.5000 mg | ORAL_TABLET | Freq: Every day | ORAL | 2 refills | Status: DC

## 2019-12-29 NOTE — Chronic Care Management (AMB) (Signed)
Chronic Care Management   Follow Up Note   12/29/2019 Name: Randy [name] MRN: [id number] DOB: 1934/11/30  Referred by: [name], DO Reason for referral : No chief complaint on file.   Randy [name] is a 85 y.o. year old male who is a primary care patient of [name], DO. The CCM team was consulted for assistance with chronic disease management and care coordination needs.    Receive message from patient's daughter/caregiver, Randy [name], today.  I reached out to patient's daughter/caregiver, Randy [name], by phone today.    Review of patient status, including review of consultants reports, relevant laboratory and other test results, and collaboration with appropriate care team members and the patient's provider was performed as part of comprehensive patient evaluation and provision of chronic care management services.    SDOH (Social Determinants of Health) assessments performed: No See Care Plan activities for detailed interventions related to [facility])     Outpatient Encounter Medications as of 12/29/2019  Medication Sig Note  . carbidopa-levodopa (SINEMET IR) 25-100 MG tablet Take 1 tablet by mouth 4 (four) times daily -  with meals and at bedtime.   . cholecalciferol (VITAMIN D) 1000 units tablet Take 1,000 Units by mouth daily.   . Coenzyme Q10 (CO Q 10) 100 MG CAPS Take 100 mg by mouth daily.    . diclofenac Sodium (VOLTAREN) 1 % GEL Apply 2 g topically 4 (four) times daily as needed. To knee arthritis pain   . docusate sodium (COLACE) 100 MG capsule Take 1 capsule (100 mg total) by mouth 2 (two) times daily. (Patient not taking: Reported on 11/12/2019)   . fluticasone (FLONASE) 50 MCG/ACT nasal spray Place 2 sprays into both nostrils daily as needed for allergies.    . furosemide (LASIX) 20 MG tablet Take 1 tablet (20 mg total) by mouth every other day. 12/17/2019: Taking on Mondays, Wednesdays and Fridays  . melatonin 5 MG TABS Take 5 mg by mouth at  bedtime as needed (sleep).   . Multiple Vitamin (MULTIVITAMIN WITH MINERALS) TABS tablet Take 1 tablet by mouth daily.   [name] OLANZapine (ZYPREXA) 5 MG tablet TAKE 1 TABLET (5 MG TOTAL) BY MOUTH AT BEDTIME AS NEEDED (AGITATION).   . Omega-3 Fatty Acids (FISH OIL) 1200 MG CAPS Take 1,200 mg by mouth 2 (two) times daily.    [name] omeprazole (PRILOSEC) 20 MG capsule Take 20 mg by mouth daily.   . rivastigmine (EXELON) 1.5 MG capsule Take 1 capsule by mouth 2 (two) times daily with a meal.   . vitamin C (ASCORBIC ACID) 500 MG tablet Take 500 mg by mouth daily.   [name] zinc sulfate 220 (50 Zn) MG capsule Take 1 capsule (220 mg total) by mouth daily.    No facility-administered encounter medications on file as of 12/29/2019.    Goals Addressed            This Visit's Progress   . PharmD - Medication Management       CARE PLAN ENTRY (see longitudinal plan of care for additional care plan information)  Current Barriers:    . Chronic Disease Management support, education, and care coordination needs related to hypertension, osteoarthritis, hyperlipidemia, insomnia and memory loss . Cognitive Deficits  Pharmacist Clinical Goal(s):  [name] Over the next 30 days, patient/caregiver will work with CM Pharmacist to address needs related to medication management  Interventions:  Hypertension . Receive message from patient's daughter/caregiver, Randy [name], this morning.  o Daughter has  restarted monitoring patient's BP as requested. Reports the following readings:  AM PM Notes  20 - December  149/94, HR 67   21 - December 141/85, HR 65 167/102, HR 63* *Rechecked: 168/101; 169/105 o  Daughter states that she restarted patient's amlodipine 2.5 mg this morning - Note amlodipine Rx discontinued by PCP on 12/10 as caregiver reporting significant swelling in patient's legs and potential for amlodipine to contribute to peripheral edema . Collaborate with patient's PCP. Provider agrees with plan to start patient on  enalapril 2.5 mg QAM.  o Rx sent to pharmacy . Follow up with patient's daughter/caregiver today o Counsel daughter/caregiver to STOP amlodipine 2.5 mg as previously discontinued by PCP and to START enalapril 2.5 mg QAM tomorrow as directed by PCP. - Daughter verbalizes understaning of plan via teach back method o Note patient continuing to take furosemide 20 mg on Mondays, Wednesdays and Fridays ? Counsel daughter to continue to monitor patient's home BP, keep log of results and to contact office for BP readings outside of established parameters or any new or worsening symptoms ? Will keep appointment as scheduled for next week to review readings  Patient Self Care Activities:  . Patient to take medications as directed with assistance of daughter o Daughter aids Mr. Guzzo with filling his weekly pillbox and monitoring for adherence . Attends all scheduled provider appointments . Calls pharmacy for medication refills . Calls provider office for new concerns or questions . Patient/daughter to keep log of BP readings   Please see past updates related to this goal by clicking on the "Past Updates" button in the selected goal         Plan  Telephone follow up appointment with care management team member scheduled for: 12/27 at 8:45 am  [name], PharmD, [place] [phone]

## 2019-12-29 NOTE — Patient Instructions (Signed)
Thank you allowing the Chronic Care Management Team to be a part of your care! It was a pleasure speaking with you today!     CCM (Chronic Care Management) Team    Noreene Larsson RN, MSN, CCM Nurse Care Coordinator  [phone]   [name] PharmD  Clinical Pharmacist  [phone]   Eula Fried LCSW Clinical Social Worker [phone]  Visit Information  Goals Addressed            This Visit's Progress   . PharmD - Medication Management       CARE PLAN ENTRY (see longitudinal plan of care for additional care plan information)  Current Barriers:    . Chronic Disease Management support, education, and care coordination needs related to hypertension, osteoarthritis, hyperlipidemia, insomnia and memory loss . Cognitive Deficits  Pharmacist Clinical Goal(s):  [name] Over the next 30 days, patient/caregiver will work with CM Pharmacist to address needs related to medication management  Interventions:  Hypertension . Receive message from patient's daughter/caregiver, [name], this morning.  o Daughter has restarted monitoring patient's BP as requested. Reports the following readings:  AM PM Notes  20 - December  149/94, HR 67   21 - December 141/85, HR 65 167/102, HR 63* *Rechecked: 168/101; 169/105 o  Daughter states that she restarted patient's amlodipine 2.5 mg this morning - Note amlodipine Rx discontinued by PCP on 12/10 as caregiver reporting significant swelling in patient's legs and potential for amlodipine to contribute to peripheral edema . Collaborate with patient's PCP. Provider agrees with plan to start patient on enalapril 2.5 mg QAM.  o Rx sent to pharmacy . Follow up with patient's daughter/caregiver today o Counsel daughter/caregiver to STOP amlodipine 2.5 mg as previously discontinued by PCP and to START enalapril 2.5 mg QAM tomorrow as directed by PCP. - Daughter verbalizes understaning of plan via teach back method o Note patient continuing to  take furosemide 20 mg on Mondays, Wednesdays and Fridays ? Counsel daughter to continue to monitor patient's home BP, keep log of results and to contact office for BP readings outside of established parameters or any new or worsening symptoms ? Will keep appointment as scheduled for next week to review readings  Patient Self Care Activities:  . Patient to take medications as directed with assistance of daughter o Daughter aids Mr. Baldinger with filling his weekly pillbox and monitoring for adherence . Attends all scheduled provider appointments . Calls pharmacy for medication refills . Calls provider office for new concerns or questions . Patient/daughter to keep log of BP readings   Please see past updates related to this goal by clicking on the "Past Updates" button in the selected goal         The patient verbalized understanding of instructions, educational materials, and care plan provided today and declined offer to receive copy of patient instructions, educational materials, and care plan.   Telephone follow up appointment with care management team member scheduled for: 12/27 at 8:45 am  [name], PharmD, [place] [phone]

## 2019-12-30 ENCOUNTER — Telehealth: Payer: Self-pay | Admitting: General Practice

## 2019-12-30 ENCOUNTER — Ambulatory Visit: Payer: Self-pay | Admitting: General Practice

## 2019-12-30 DIAGNOSIS — G8929 Other chronic pain: Secondary | ICD-10-CM

## 2019-12-30 DIAGNOSIS — R413 Other amnesia: Secondary | ICD-10-CM

## 2019-12-30 DIAGNOSIS — F0280 Dementia in other diseases classified elsewhere without behavioral disturbance: Secondary | ICD-10-CM

## 2019-12-30 DIAGNOSIS — I10 Essential (primary) hypertension: Secondary | ICD-10-CM

## 2019-12-30 DIAGNOSIS — R531 Weakness: Secondary | ICD-10-CM

## 2019-12-30 DIAGNOSIS — E782 Mixed hyperlipidemia: Secondary | ICD-10-CM

## 2019-12-30 DIAGNOSIS — Z789 Other specified health status: Secondary | ICD-10-CM

## 2019-12-30 DIAGNOSIS — R600 Localized edema: Secondary | ICD-10-CM

## 2019-12-30 DIAGNOSIS — M1712 Unilateral primary osteoarthritis, left knee: Secondary | ICD-10-CM

## 2019-12-30 DIAGNOSIS — M25561 Pain in right knee: Secondary | ICD-10-CM

## 2019-12-30 DIAGNOSIS — G3183 Dementia with Lewy bodies: Secondary | ICD-10-CM

## 2019-12-30 NOTE — Chronic Care Management (AMB) (Signed)
Chronic Care Management   Follow Up Note   12/30/2019 Name: Randy [name] MRN: [id number] DOB: 1934-07-03  Referred by: [name], DO Reason for referral : Chronic Care Management (RNCM Follow up for Chronic Disease Management and Care Coordination Needs)   Randy [name] is a 85 y.o. year old male who is a primary care patient of [name], DO. The CCM team was consulted for assistance with chronic disease management and care coordination needs.    Review of patient status, including review of consultants reports, relevant laboratory and other test results, and collaboration with appropriate care team members and the patient's provider was performed as part of comprehensive patient evaluation and provision of chronic care management services.    SDOH (Social Determinants of Health) assessments performed: Yes See Care Plan activities for detailed interventions related to [facility])     Outpatient Encounter Medications as of 12/30/2019  Medication Sig Note  . carbidopa-levodopa (SINEMET IR) 25-100 MG tablet Take 1 tablet by mouth 4 (four) times daily -  with meals and at bedtime.   . cholecalciferol (VITAMIN D) 1000 units tablet Take 1,000 Units by mouth daily.   . Coenzyme Q10 (CO Q 10) 100 MG CAPS Take 100 mg by mouth daily.    . diclofenac Sodium (VOLTAREN) 1 % GEL Apply 2 g topically 4 (four) times daily as needed. To knee arthritis pain   . docusate sodium (COLACE) 100 MG capsule Take 1 capsule (100 mg total) by mouth 2 (two) times daily. (Patient not taking: Reported on 11/12/2019)   . enalapril (VASOTEC) 2.5 MG tablet Take 1 tablet (2.5 mg total) by mouth daily.   . fluticasone (FLONASE) 50 MCG/ACT nasal spray Place 2 sprays into both nostrils daily as needed for allergies.    . furosemide (LASIX) 20 MG tablet Take 1 tablet (20 mg total) by mouth every other day. 12/17/2019: Taking on Mondays, Wednesdays and Fridays  . melatonin 5 MG TABS Take 5 mg by mouth at  bedtime as needed (sleep).   . Multiple Vitamin (MULTIVITAMIN WITH MINERALS) TABS tablet Take 1 tablet by mouth daily.   [name] OLANZapine (ZYPREXA) 5 MG tablet TAKE 1 TABLET (5 MG TOTAL) BY MOUTH AT BEDTIME AS NEEDED (AGITATION).   . Omega-3 Fatty Acids (FISH OIL) 1200 MG CAPS Take 1,200 mg by mouth 2 (two) times daily.    [name] omeprazole (PRILOSEC) 20 MG capsule Take 20 mg by mouth daily.   . rivastigmine (EXELON) 1.5 MG capsule Take 1 capsule by mouth 2 (two) times daily with a meal.   . vitamin C (ASCORBIC ACID) 500 MG tablet Take 500 mg by mouth daily.   [name] zinc sulfate 220 (50 Zn) MG capsule Take 1 capsule (220 mg total) by mouth daily.    No facility-administered encounter medications on file as of 12/30/2019.     Objective:  BP Readings from Last 3 Encounters:  11/10/19 (!) 141/78  10/12/19 132/78  10/06/19 (!) 143/87    Goals Addressed              This Visit's Progress   .  RNCM: Pt's daughter- "the pain is better for a few days after he gets the shots" (pt-stated)        CARE PLAN ENTRY (see longitudinal plan of care for additional care plan information)  Current Barriers:  [name] Knowledge Deficits related to pain relief in patient with chronic knee pain . Care Coordination needs related to pain control in a  patient with Chronic knee pain (disease states)  Nurse Case Manager Clinical Goal(s):  [name] Over the next 120 days, patient will verbalize understanding of plan for pain management for chronic knee pain . Over the next 120 days, patient will work with [facility] [place] team, and pcp to address needs related to pain in bilateral knees and best treatment options . Over the next 120 days, patient will demonstrate a decrease in pain exacerbations as evidenced by controlled level of pain in bilateral knees . Over the next 120 days, patient will demonstrate improved health management independence as evidenced byno falls, ability to do normal activities and improved quality of  life  Interventions:  . Inter-disciplinary care team collaboration (see longitudinal plan of care) . Evaluation of current treatment plan related to pain management  and patient's adherence to plan as established by provider. 10-04-2019: The patient is having worsening knee pain and is to the point where he can not walk well at all. Denies any new falls.  The patients daughter was getting to call rescue to have the patient transported to the hospital to be evaluated for several things. She was allowing the patient to finish eating. 12-30-2019: The patient is no longer working with PT any longer. The daughter says he was not progressing. The patient is shuffling his feet still. The patients daughter states that he has not had any falls and she is mindful of safety.  . Advised patient to call the provider for worsening sx/sx of uncontrolled pain or increased level of intensity of pain.   . Provided education to patient re: alternative pain relief measures.  . Reviewed medications with patient and discussed compliance.  The patient states that the shots received on 7-12 were helpful for pain relief. This work for a few days and then the patient generally complains again with pain.  The patient has had this for a while per the daughter and the topical NSAIDs do work and help also. 12-30-2019: The patient saw the specialist on 12-16-2019 and he changed a medications. The daughter states that she sees some improvement. The patient is stable at this time. She knows to call for changes or new concerns. . Discussed plans with patient for ongoing care management follow up and provided patient with direct contact information for care management team  Patient Self Care Activities:  . Patient verbalizes understanding of plan to manage pain of bilateral knees by working with the pcp and CCM team . Calls provider office for new concerns or questions . Unable to independently manage pain and discomfort of bilateral  knees  Please see past updates related to this goal by clicking on the "Past Updates" button in the selected goal      .  RNCM: Recently Dad has had swelling in both of his legs and feet        Current Barriers:  [name] Knowledge Deficits related to management of HTN as evidence of swelling in feet and legs and HLD,  not always being able to eat a heart healthy diet . Cognitive Deficits  Nurse Case Manager Clinical Goal(s):  [name] Over the next 90 days, patient will verbalize understanding of plan for managing HTN and HLD . Over the next 90 days, patient will work with [facility] [place] pharmacist and pcp to address needs related to managing HTN and increased swelling in feet and legs . Over the next 90 days, patient will demonstrate a decrease in bilateral lower extremity edema exacerbations as evidenced by normalized blood  pressures and no evidence of edema present in bilateral lower legs . Over the next 90 days, patient will demonstrate improved adherence to prescribed treatment plan for wearing knee high ted hose as evidenced bydecreased bilateral lower leg edema . Over the next 90 days, patient will work with CM team pharmacist to review medications as needed and take Lasix as prescribed by pcp . Over the next 90 days, the patient will demonstrate ongoing self health care management ability as evidenced by elevating legs when sitting, taking meds as prescribed, following a heart healthy diet, and wearing ted hose to reduce bilateral lower leg edema.  Interventions:  . Evaluation of current treatment plan related to HTN and HLD and patient's adherence to plan as established by provider.  11-08-2019: The patient is home from rehab and working with PT. Has post discharge appointment with pcp on 11-10-2019.  The daughter states they have not been taking his blood pressure at home but will start back getting a routine going.  They did make changes to a medication but she can not remember what it was.  Advised to  bring all paperwork with the patient to the pcp visit on 11-10-2019.  12-30-2019: The patient is no longer working with PT. He was not improving and is still shuffling with his feet. They discharged him from services.  The patients daughter states he does not seem to be urinating as often as he was and she has noticed more swelling in his legs. Blood pressures have been elevated. Collaboration with the pcp and pharmacist on 12-29-2019 has resulted in change of medication for the patient. The daughter has a good understanding of medication changes and knows to call if the blood pressure is not coming down. Review of calling office for systolic [id number] and diastolic [id number]. Recommended that she try to get an appointment to see Dr. Parks Ranger soon to evaluate and give recommendations.  . Provided education to patient re: best way to put on ted hose with the use of a plastic grocery bag for ease of putting on hose. Also alternative compression socks" found on [place] or at [place] that my be beneficial for the patient. 10-04-2019: The patients daughter states that the patient is still having issues putting on the ted hose even with the ideas given to them with the grocery bags.  The daughter did get the patient some diabetic socks.He can put those on. Education given on elevation of legs when sitting and to try ace wraps, just make sure they are not too tight around his legs. The patient is having worsening swelling today and also shortness of breath. The patients daughter was out of town this weekend but endorses compliance with medications. The patient has several health issues impacting his care right now. 12-30-2019: Education on monitoring for extra swelling and po intake and urinary output. Also discussed elevation of legs and feet when  sitting down and to try to use compression hose, ace wraps, or compression socks to help with the extra fluid on board. Education on monitoring sodium and fats content and to monitor for  hidden sodium in foods. The patients daughter verbalized understanding.  . Reviewed medications with patient and discussed the use of Lasix. The daughter verbalized the patient did not have any Lasix. The patient has been started back on Lasix and per the daughter today the Lasix has been effective in helping with the swelling and she has noted decrease in swelling. 12-30-2019: The patient is taking Lasix every other day.  Her mom told her yesterday that she has noticed the patient not going to the bathroom as often during the day. She has been noticing increased swelling. Will collaborate with the pcp with the daughters concerns. Recommended she call the office to get an appointment to see the pcp asap for evaluation and treatment recommendations. Will notify the pharmacist and pcp of increased swelling in legs and feet.  . Discussed plans with patient for ongoing care management follow up and provided patient with direct contact information for care management team . Advised patient, providing education and rationale, to monitor blood pressure daily and record, calling pcp for findings outside established parameters.  . Provided patient with heart healthy  educational materials related to adhering to heart healthy diet  . Pharmacy referral for medication change follow up and support . Educated daughter on the [place] functionality to view notes/lab work and communicate with the pcp team  Patient Self Care Activities:  . Patient verbalizes understanding of plan to use Lasix prn for swelling in feet and legs per MD recommendations . Attends all scheduled provider appointments . Calls provider office for new concerns or questions . Unable to independently manage bilateral lower leg edema  Please see past updates related to this goal by clicking on the "Past Updates" button in the selected goal           Plan:   Telephone follow up appointment with care management team member scheduled for:  01-27-2020 at [place] am   [place], MSN, [place] Mobile: [phone]

## 2019-12-30 NOTE — Patient Instructions (Addendum)
Visit Information  Visit Information  Goals Addressed              This Visit's Progress   .  RNCM: Pt's daughter- "the pain is better for a few days after he gets the shots" (pt-stated)        CARE PLAN ENTRY (see longitudinal plan of care for additional care plan information)  Current Barriers:  [name] Knowledge Deficits related to pain relief in patient with chronic knee pain . Care Coordination needs related to pain control in a patient with Chronic knee pain (disease states)  Nurse Case Manager Clinical Goal(s):  [name] Over the next 120 days, patient will verbalize understanding of plan for pain management for chronic knee pain . Over the next 120 days, patient will work with [facility], [place] team, and pcp to address needs related to pain in bilateral knees and best treatment options . Over the next 120 days, patient will demonstrate a decrease in pain exacerbations as evidenced by controlled level of pain in bilateral knees . Over the next 120 days, patient will demonstrate improved health management independence as evidenced byno falls, ability to do normal activities and improved quality of life  Interventions:  . Inter-disciplinary care team collaboration (see longitudinal plan of care) . Evaluation of current treatment plan related to pain management  and patient's adherence to plan as established by provider. 10-04-2019: The patient is having worsening knee pain and is to the point where he can not walk well at all. Denies any new falls.  The patients daughter was getting to call rescue to have the patient transported to the hospital to be evaluated for several things. She was allowing the patient to finish eating. 12-30-2019: The patient is no longer working with PT any longer. The daughter says he was not progressing. The patient is shuffling his feet still. The patients daughter states that he has not had any falls and she is mindful of safety.  . Advised patient to call the provider for  worsening sx/sx of uncontrolled pain or increased level of intensity of pain.   . Provided education to patient re: alternative pain relief measures.  . Reviewed medications with patient and discussed compliance.  The patient states that the shots received on 7-12 were helpful for pain relief. This work for a few days and then the patient generally complains again with pain.  The patient has had this for a while per the daughter and the topical NSAIDs do work and help also. 12-30-2019: The patient saw the specialist on 12-16-2019 and he changed a medications. The daughter states that she sees some improvement. The patient is stable at this time. She knows to call for changes or new concerns. . Discussed plans with patient for ongoing care management follow up and provided patient with direct contact information for care management team  Patient Self Care Activities:  . Patient verbalizes understanding of plan to manage pain of bilateral knees by working with the pcp and CCM team . Calls provider office for new concerns or questions . Unable to independently manage pain and discomfort of bilateral knees  Please see past updates related to this goal by clicking on the "Past Updates" button in the selected goal      .  RNCM: Recently Dad has had swelling in both of his legs and feet        Current Barriers:  [name] Knowledge Deficits related to management of HTN as evidence of swelling in feet and legs  and HLD,  not always being able to eat a heart healthy diet . Cognitive Deficits  Nurse Case Manager Clinical Goal(s):  [name] Over the next 90 days, patient will verbalize understanding of plan for managing HTN and HLD . Over the next 90 days, patient will work with [facility] [place] pharmacist and pcp to address needs related to managing HTN and increased swelling in feet and legs . Over the next 90 days, patient will demonstrate a decrease in bilateral lower extremity edema exacerbations as evidenced by normalized  blood pressures and no evidence of edema present in bilateral lower legs . Over the next 90 days, patient will demonstrate improved adherence to prescribed treatment plan for wearing knee high ted hose as evidenced bydecreased bilateral lower leg edema . Over the next 90 days, patient will work with CM team pharmacist to review medications as needed and take Lasix as prescribed by pcp . Over the next 90 days, the patient will demonstrate ongoing self health care management ability as evidenced by elevating legs when sitting, taking meds as prescribed, following a heart healthy diet, and wearing ted hose to reduce bilateral lower leg edema.  Interventions:  . Evaluation of current treatment plan related to HTN and HLD and patient's adherence to plan as established by provider.  11-08-2019: The patient is home from rehab and working with PT. Has post discharge appointment with pcp on 11-10-2019.  The daughter states they have not been taking his blood pressure at home but will start back getting a routine going.  They did make changes to a medication but she can not remember what it was.  Advised to bring all paperwork with the patient to the pcp visit on 11-10-2019.  12-30-2019: The patient is no longer working with PT. He was not improving and is still shuffling with his feet. They discharged him from services.  The patients daughter states he does not seem to be urinating as often as he was and she has noticed more swelling in his legs. Blood pressures have been elevated. Collaboration with the pcp and pharmacist on 12-29-2019 has resulted in change of medication for the patient. The daughter has a good understanding of medication changes and knows to call if the blood pressure is not coming down. Review of calling office for systolic >540 and diastolic >98. Recommended that she try to get an appointment to see Dr. Parks Ranger soon to evaluate and give recommendations.  . Provided education to patient re: best  way to put on ted hose with the use of a plastic grocery bag for ease of putting on hose. Also alternative compression socks" found on [place] or at [place] that my be beneficial for the patient. 10-04-2019: The patients daughter states that the patient is still having issues putting on the ted hose even with the ideas given to them with the grocery bags.  The daughter did get the patient some diabetic socks.He can put those on. Education given on elevation of legs when sitting and to try ace wraps, just make sure they are not too tight around his legs. The patient is having worsening swelling today and also shortness of breath. The patients daughter was out of town this weekend but endorses compliance with medications. The patient has several health issues impacting his care right now. 12-30-2019: Education on monitoring for extra swelling and po intake and urinary output. Also discussed elevation of legs and feet when  sitting down and to try to use compression hose, ace wraps, or  compression socks to help with the extra fluid on board. Education on monitoring sodium and fats content and to monitor for hidden sodium in foods. The patients daughter verbalized understanding.  . Reviewed medications with patient and discussed the use of Lasix. The daughter verbalized the patient did not have any Lasix. The patient has been started back on Lasix and per the daughter today the Lasix has been effective in helping with the swelling and she has noted decrease in swelling. 12-30-2019: The patient is taking Lasix every other day. Her mom told her yesterday that she has noticed the patient not going to the bathroom as often during the day. She has been noticing increased swelling. Will collaborate with the pcp with the daughters concerns. Recommended she call the office to get an appointment to see the pcp asap for evaluation and treatment recommendations. Will notify the pharmacist and pcp of increased swelling in legs and  feet.  . Discussed plans with patient for ongoing care management follow up and provided patient with direct contact information for care management team . Advised patient, providing education and rationale, to monitor blood pressure daily and record, calling pcp for findings outside established parameters.  . Provided patient with heart healthy  educational materials related to adhering to heart healthy diet  . Pharmacy referral for medication change follow up and support . Educated daughter on the [place] functionality to view notes/lab work and communicate with the pcp team  Patient Self Care Activities:  . Patient verbalizes understanding of plan to use Lasix prn for swelling in feet and legs per MD recommendations . Attends all scheduled provider appointments . Calls provider office for new concerns or questions . Unable to independently manage bilateral lower leg edema  Please see past updates related to this goal by clicking on the "Past Updates" button in the selected goal                The patient verbalized understanding of instructions, educational materials, and care plan provided today and declined offer to receive copy of patient instructions, educational materials, and care plan.   Telephone follow up appointment with care management team member scheduled for: 01-27-2020 at [place] am  Noreene Larsson RN, MSN, [place] Mobile: [phone]

## 2019-12-30 NOTE — Chronic Care Management (AMB) (Deleted)
Chronic Care Management   Follow Up Note   12/30/2019 Name: BRANDONLEE NAVIS MRN: [id number] DOB: 1934-07-03  Referred by: [name], DO Reason for referral : Chronic Care Management (RNCM Follow up for Chronic Disease Management and Care Coordination Needs)   ARLIE RIKER is a 85 y.o. year old male who is a primary care patient of [name], DO. The CCM team was consulted for assistance with chronic disease management and care coordination needs.    Review of patient status, including review of consultants reports, relevant laboratory and other test results, and collaboration with appropriate care team members and the patient's provider was performed as part of comprehensive patient evaluation and provision of chronic care management services.    SDOH (Social Determinants of Health) assessments performed: Yes See Care Plan activities for detailed interventions related to [facility])     Outpatient Encounter Medications as of 12/30/2019  Medication Sig Note  . carbidopa-levodopa (SINEMET IR) 25-100 MG tablet Take 1 tablet by mouth 4 (four) times daily -  with meals and at bedtime.   . cholecalciferol (VITAMIN D) 1000 units tablet Take 1,000 Units by mouth daily.   . Coenzyme Q10 (CO Q 10) 100 MG CAPS Take 100 mg by mouth daily.    . diclofenac Sodium (VOLTAREN) 1 % GEL Apply 2 g topically 4 (four) times daily as needed. To knee arthritis pain   . docusate sodium (COLACE) 100 MG capsule Take 1 capsule (100 mg total) by mouth 2 (two) times daily. (Patient not taking: Reported on 11/12/2019)   . enalapril (VASOTEC) 2.5 MG tablet Take 1 tablet (2.5 mg total) by mouth daily.   . fluticasone (FLONASE) 50 MCG/ACT nasal spray Place 2 sprays into both nostrils daily as needed for allergies.    . furosemide (LASIX) 20 MG tablet Take 1 tablet (20 mg total) by mouth every other day. 12/17/2019: Taking on Mondays, Wednesdays and Fridays  . melatonin 5 MG TABS Take 5 mg by mouth at  bedtime as needed (sleep).   . Multiple Vitamin (MULTIVITAMIN WITH MINERALS) TABS tablet Take 1 tablet by mouth daily.   [name] OLANZapine (ZYPREXA) 5 MG tablet TAKE 1 TABLET (5 MG TOTAL) BY MOUTH AT BEDTIME AS NEEDED (AGITATION).   . Omega-3 Fatty Acids (FISH OIL) 1200 MG CAPS Take 1,200 mg by mouth 2 (two) times daily.    [name] omeprazole (PRILOSEC) 20 MG capsule Take 20 mg by mouth daily.   . rivastigmine (EXELON) 1.5 MG capsule Take 1 capsule by mouth 2 (two) times daily with a meal.   . vitamin C (ASCORBIC ACID) 500 MG tablet Take 500 mg by mouth daily.   [name] zinc sulfate 220 (50 Zn) MG capsule Take 1 capsule (220 mg total) by mouth daily.    No facility-administered encounter medications on file as of 12/30/2019.     Objective:  BP Readings from Last 3 Encounters:  11/10/19 (!) 141/78  10/12/19 132/78  10/06/19 (!) 143/87    Goals Addressed              This Visit's Progress   .  RNCM: Pt's daughter- "the pain is better for a few days after he gets the shots" (pt-stated)        CARE PLAN ENTRY (see longitudinal plan of care for additional care plan information)  Current Barriers:  [name] Knowledge Deficits related to pain relief in patient with chronic knee pain . Care Coordination needs related to pain control in a  patient with Chronic knee pain (disease states)  Nurse Case Manager Clinical Goal(s):  [name] Over the next 120 days, patient will verbalize understanding of plan for pain management for chronic knee pain . Over the next 120 days, patient will work with [facility] [place] team, and pcp to address needs related to pain in bilateral knees and best treatment options . Over the next 120 days, patient will demonstrate a decrease in pain exacerbations as evidenced by controlled level of pain in bilateral knees . Over the next 120 days, patient will demonstrate improved health management independence as evidenced byno falls, ability to do normal activities and improved quality of  life  Interventions:  . Inter-disciplinary care team collaboration (see longitudinal plan of care) . Evaluation of current treatment plan related to pain management  and patient's adherence to plan as established by provider. 10-04-2019: The patient is having worsening knee pain and is to the point where he can not walk well at all. Denies any new falls.  The patients daughter was getting to call rescue to have the patient transported to the hospital to be evaluated for several things. She was allowing the patient to finish eating. 12-30-2019: The patient is no longer working with PT any longer. The daughter says he was not progressing. The patient is shuffling his feet still. The patients daughter states that he has not had any falls and she is mindful of safety.  . Advised patient to call the provider for worsening sx/sx of uncontrolled pain or increased level of intensity of pain.   . Provided education to patient re: alternative pain relief measures.  . Reviewed medications with patient and discussed compliance.  The patient states that the shots received on 7-12 were helpful for pain relief. This work for a few days and then the patient generally complains again with pain.  The patient has had this for a while per the daughter and the topical NSAIDs do work and help also. 12-30-2019: The patient saw the specialist on 12-16-2019 and he changed a medications. The daughter states that she sees some improvement. The patient is stable at this time. She knows to call for changes or new concerns. . Discussed plans with patient for ongoing care management follow up and provided patient with direct contact information for care management team  Patient Self Care Activities:  . Patient verbalizes understanding of plan to manage pain of bilateral knees by working with the pcp and CCM team . Calls provider office for new concerns or questions . Unable to independently manage pain and discomfort of bilateral  knees  Please see past updates related to this goal by clicking on the "Past Updates" button in the selected goal      .  RNCM: Recently Dad has had swelling in both of his legs and feet        Current Barriers:  [name] Knowledge Deficits related to management of HTN as evidence of swelling in feet and legs and HLD,  not always being able to eat a heart healthy diet . Cognitive Deficits  Nurse Case Manager Clinical Goal(s):  [name] Over the next 90 days, patient will verbalize understanding of plan for managing HTN and HLD . Over the next 90 days, patient will work with [facility] [place] pharmacist and pcp to address needs related to managing HTN and increased swelling in feet and legs . Over the next 90 days, patient will demonstrate a decrease in bilateral lower extremity edema exacerbations as evidenced by normalized blood  pressures and no evidence of edema present in bilateral lower legs . Over the next 90 days, patient will demonstrate improved adherence to prescribed treatment plan for wearing knee high ted hose as evidenced bydecreased bilateral lower leg edema . Over the next 90 days, patient will work with CM team pharmacist to review medications as needed and take Lasix as prescribed by pcp . Over the next 90 days, the patient will demonstrate ongoing self health care management ability as evidenced by elevating legs when sitting, taking meds as prescribed, following a heart healthy diet, and wearing ted hose to reduce bilateral lower leg edema.  Interventions:  . Evaluation of current treatment plan related to HTN and HLD and patient's adherence to plan as established by provider.  11-08-2019: The patient is home from rehab and working with PT. Has post discharge appointment with pcp on 11-10-2019.  The daughter states they have not been taking his blood pressure at home but will start back getting a routine going.  They did make changes to a medication but she can not remember what it was.  Advised to  bring all paperwork with the patient to the pcp visit on 11-10-2019.  12-30-2019: The patient is no longer working with PT. He was not improving and is still shuffling with his feet. They discharged him from services.  The patients daughter states he does not seem to be urinating as often as he was and she has noticed more swelling in his legs. Recommended that she try to get an appointment to see Dr. Parks Ranger soon to evaluate and give recommendations.  . Provided education to patient re: best way to put on ted hose with the use of a plastic grocery bag for ease of putting on hose. Also alternative compression socks" found on [place] or at [place] that my be beneficial for the patient. 10-04-2019: The patients daughter states that the patient is still having issues putting on the ted hose even with the ideas given to them with the grocery bags.  The daughter did get the patient some diabetic socks.He can put those on. Education given on elevation of legs when sitting and to try ace wraps, just make sure they are not too tight around his legs. The patient is having worsening swelling today and also shortness of breath. The patients daughter was out of town this weekend but endorses compliance with medications. The patient has several health issues impacting his care right now. 12-30-2019: Education on monitoring for extra swelling and po intake and urinary output. Also discussed elevation of legs and feet when  sitting down and to try to use compression hose, ace wraps, or compression socks to help with the extra fluid on board. Education on monitoring sodium and fats content and to monitor for hidden sodium in foods. The patients daughter verbalized understanding.  . Reviewed medications with patient and discussed the use of Lasix. The daughter verbalized the patient did not have any Lasix. The patient has been started back on Lasix and per the daughter today the Lasix has been effective in helping with the  swelling and she has noted decrease in swelling. 12-30-2019: The patient is taking Lasix every other day. Her mom told her yesterday that she has noticed the patient not going to the bathroom as often during the day. She has been noticing increased swelling. Will collaborate with the pcp with the daughters concerns. Recommended she call the office to get an appointment to see the pcp asap for evaluation and  treatment recommendations. Will notify the pharmacist and pcp of increased swelling in legs and feet.  . Discussed plans with patient for ongoing care management follow up and provided patient with direct contact information for care management team . Advised patient, providing education and rationale, to monitor blood pressure daily and record, calling pcp for findings outside established parameters.  . Provided patient with heart healthy  educational materials related to adhering to heart healthy diet  . Pharmacy referral for medication change follow up and support . Educated daughter on the [place] functionality to view notes/lab work and communicate with the pcp team  Patient Self Care Activities:  . Patient verbalizes understanding of plan to use Lasix prn for swelling in feet and legs per MD recommendations . Attends all scheduled provider appointments . Calls provider office for new concerns or questions . Unable to independently manage bilateral lower leg edema  Please see past updates related to this goal by clicking on the "Past Updates" button in the selected goal           Plan:   Telephone follow up appointment with care management team member scheduled for: 01-27-2020 at [place] am   [place], MSN, [place] Mobile: [phone]

## 2020-01-03 ENCOUNTER — Ambulatory Visit: Payer: Medicare Other | Admitting: Pharmacist

## 2020-01-03 ENCOUNTER — Other Ambulatory Visit: Payer: Self-pay

## 2020-01-03 ENCOUNTER — Encounter: Payer: Self-pay | Admitting: Family Medicine

## 2020-01-03 ENCOUNTER — Ambulatory Visit (INDEPENDENT_AMBULATORY_CARE_PROVIDER_SITE_OTHER): Payer: Medicare Other | Admitting: Family Medicine

## 2020-01-03 VITALS — BP 154/84 | HR 73 | Resp 16 | Ht 72.0 in | Wt 225.0 lb

## 2020-01-03 DIAGNOSIS — R600 Localized edema: Secondary | ICD-10-CM | POA: Diagnosis not present

## 2020-01-03 DIAGNOSIS — I10 Essential (primary) hypertension: Secondary | ICD-10-CM

## 2020-01-03 NOTE — Patient Instructions (Signed)
Thank you allowing the Chronic Care Management Team to be a part of your care! It was a pleasure speaking with you today!     CCM (Chronic Care Management) Team    Alto Denver RN, MSN, CCM Nurse Care Coordinator  [phone]   Duanne Moron PharmD  Clinical Pharmacist  [phone]   Dickie La LCSW Clinical Social Worker [phone]  Visit Information  Goals Addressed            This Visit's Progress   . PharmD - Medication Management       CARE PLAN ENTRY (see longitudinal plan of care for additional care plan information)  Current Barriers:    . Chronic Disease Management support, education, and care coordination needs related to hypertension, osteoarthritis, hyperlipidemia, insomnia and memory loss . Cognitive Deficits  Pharmacist Clinical Goal(s):  [name] Over the next 30 days, patient/caregiver will work with CM Pharmacist to address needs related to medication management  Interventions: . Collaborate with CCM Nurse Case Manager. RNCM spoke with patient's daughter on 12/23 and advised daughter to schedule appointment with PCP as daughter reported patient having increased swelling in his legs and reduced urinary output. . Follow up with daughter/caregiver today o Reports patient's latest blood pressure readings have been elevated, but she is unable to find the log of his reading - Locates one reading from 12/25: 159/104, HR 63 o Daughter reports patient continuing to have limited urinary output. Reports patient continuing to take furosemide 20 mg on Mondays, Wednesdays and Fridays - Again, advise daughter to call the office to schedule appointment with PCP for further evaluation. She states that she will call now.  - Advise daughter to bring blood pressure log with to this appointment if able to locate it.  Patient Self Care Activities:  . Patient to take medications as directed with assistance of daughter o Daughter aids Mr. Deeg with filling his weekly  pillbox and monitoring for adherence . Attends all scheduled provider appointments . Calls pharmacy for medication refills . Calls provider office for new concerns or questions . Patient/daughter to keep log of BP readings   Please see past updates related to this goal by clicking on the "Past Updates" button in the selected goal         The patient verbalized understanding of instructions, educational materials, and care plan provided today and declined offer to receive copy of patient instructions, educational materials, and care plan.   The care management team will reach out to the patient again over the next 30 days.   Duanne Moron, PharmD, [facility] Clinical Pharmacist [facility] [employer] [phone]

## 2020-01-03 NOTE — Chronic Care Management (AMB) (Signed)
Chronic Care Management   Follow Up Note   01/03/2020 Name: Randy [name] MRN: [id number] DOB: December 05, 1934  Referred by: [name], DO Reason for referral : Chronic Care Management (Caregiver Phone Call)   Randy [name] is a 85 y.o. year old male who is a primary care patient of [name], DO. The CCM team was consulted for assistance with chronic disease management and care coordination needs.    I reached out to patient's daughter/caregiver, [name], by phone today.    Review of patient status, including review of consultants reports, relevant laboratory and other test results, and collaboration with appropriate care team members and the patient's provider was performed as part of comprehensive patient evaluation and provision of chronic care management services.    SDOH (Social Determinants of Health) assessments performed: No See Care Plan activities for detailed interventions related to [facility])     Outpatient Encounter Medications as of 01/03/2020  Medication Sig Note  . carbidopa-levodopa (SINEMET IR) 25-100 MG tablet Take 1 tablet by mouth 4 (four) times daily -  with meals and at bedtime.   . cholecalciferol (VITAMIN D) 1000 units tablet Take 1,000 Units by mouth daily.   . Coenzyme Q10 (CO Q 10) 100 MG CAPS Take 100 mg by mouth daily.    . diclofenac Sodium (VOLTAREN) 1 % GEL Apply 2 g topically 4 (four) times daily as needed. To knee arthritis pain   . docusate sodium (COLACE) 100 MG capsule Take 1 capsule (100 mg total) by mouth 2 (two) times daily. (Patient not taking: Reported on 11/12/2019)   . enalapril (VASOTEC) 2.5 MG tablet Take 1 tablet (2.5 mg total) by mouth daily.   . fluticasone (FLONASE) 50 MCG/ACT nasal spray Place 2 sprays into both nostrils daily as needed for allergies.    . furosemide (LASIX) 20 MG tablet Take 1 tablet (20 mg total) by mouth every other day. 12/17/2019: Taking on Mondays, Wednesdays and Fridays  . melatonin 5 MG  TABS Take 5 mg by mouth at bedtime as needed (sleep).   . Multiple Vitamin (MULTIVITAMIN WITH MINERALS) TABS tablet Take 1 tablet by mouth daily.   Randy [name] OLANZapine (ZYPREXA) 5 MG tablet TAKE 1 TABLET (5 MG TOTAL) BY MOUTH AT BEDTIME AS NEEDED (AGITATION).   . Omega-3 Fatty Acids (FISH OIL) 1200 MG CAPS Take 1,200 mg by mouth 2 (two) times daily.    Randy [name] omeprazole (PRILOSEC) 20 MG capsule Take 20 mg by mouth daily.   . rivastigmine (EXELON) 1.5 MG capsule Take 1 capsule by mouth 2 (two) times daily with a meal.   . vitamin C (ASCORBIC ACID) 500 MG tablet Take 500 mg by mouth daily.   Randy [name] zinc sulfate 220 (50 Zn) MG capsule Take 1 capsule (220 mg total) by mouth daily.    No facility-administered encounter medications on file as of 01/03/2020.    Goals Addressed            This Visit's Progress   . PharmD - Medication Management       CARE PLAN ENTRY (see longitudinal plan of care for additional care plan information)  Current Barriers:    . Chronic Disease Management support, education, and care coordination needs related to hypertension, osteoarthritis, hyperlipidemia, insomnia and memory loss . Cognitive Deficits  Pharmacist Clinical Goal(s):  Randy [name] Over the next 30 days, patient/caregiver will work with CM Pharmacist to address needs related to medication management  Interventions: . Collaborate with CCM Nurse Case Manager.  RNCM spoke with patient's daughter on 12/23 and advised daughter to schedule appointment with PCP as daughter reported patient having increased swelling in his legs and reduced urinary output. . Follow up with daughter/caregiver today o Reports patient's latest blood pressure readings have been elevated, but she is unable to find the log of his reading - Locates one reading from 12/25: 159/104, HR 63 o Daughter reports patient continuing to have limited urinary output. Reports patient continuing to take furosemide 20 mg on Mondays, Wednesdays and Fridays - Again, advise  daughter to call the office to schedule appointment with PCP for further evaluation. She states that she will call now.  - Advise daughter to bring blood pressure log with to this appointment if able to locate it.  Patient Self Care Activities:  . Patient to take medications as directed with assistance of daughter o Daughter aids Randy [name] with filling his weekly pillbox and monitoring for adherence . Attends all scheduled provider appointments . Calls pharmacy for medication refills . Calls provider office for new concerns or questions . Patient/daughter to keep log of BP readings   Please see past updates related to this goal by clicking on the "Past Updates" button in the selected goal         Plan  The care management team will reach out to the patient again over the next 30 days.   Duanne Moron, PharmD, [facility] Clinical Pharmacist [facility] [employer] [phone]

## 2020-01-03 NOTE — Patient Instructions (Addendum)
Thank you for coming to the office today.  For now, fluid seems okay - keep off Amlodipine. We will continue Furosemide Lasix 20mg  - Mon - Weds - Fri (3 x week) but permission to increase this when you need, can go to DAILY if need, or max is 2 pills in one day, if needing it more often in 7-10 days, let me know. We can change the dose / instructions.  For BP - still mild elevated - goal to keep increasing Enalapril  Let's do 10mg  ONCE DAILY for now - use existing meds - 2.5mg  x 4 = 10mg  or can use previous 20mg  tab cut in half for 10mg  - once we know this is helping we can ORDER new rx 10mg  daily, if it is not helping as much we will need to increase again up to 20mg .   Please schedule a Follow-up Appointment to: Return if symptoms worsen or fail to improve, for 6 week to 3 month follow-up HTN.  If you have any other questions or concerns, please feel free to call the office or send a message through MyChart. You may also schedule an earlier appointment if necessary.  Additionally, you may be receiving a survey about your experience at our office within a few days to 1 week by e-mail or mail. We value your feedback.  9-10, DO [facility], 

## 2020-01-03 NOTE — Progress Notes (Signed)
Subjective:    Patient ID: Randy [name], male    DOB: Apr 15, 1934, 85 y.o.   MRN: [id number]  Randy [name] is a 85 y.o. male presenting on 01/03/2020 for Hypertension (Onset 2 weeks --ranges from 156/102 mm/hg)   HPI   CHRONIC HTN: Reports recent changes have been discontinued Amlodipine 5mg  due to swelling in lower extremity, he has had higher BP since discontinuing Amlodipine. He has history of hypotension therefore we had been cautious with BP Home BP readings Last few days 140-150s/90-100s Also in contact with CCM Team. Current Meds - Enalapril 2.5mg  daily (however daughter reports patient has been taking TWICE a day) similar to previous instructions on Enalapril in past. - Still taking lasix furosemide 20mg  3 days a week - Mon - Weds - Fri, today he could not tell much about if having increased urine output or not but his swelling is stable to improved today in office. Reports good compliance, took meds today. Tolerating well, w/o complaints. Admits lower extremity edema. No other symptoms of HTN Denies CP, dyspnea, HA,dizziness / lightheadedness    Depression screen [facility] 2/9 03/02/2019 01/22/2019 09/21/2018  Decreased Interest 0 0 0  Down, Depressed, Hopeless 0 0 0  PHQ - 2 Score 0 0 0    Social History   Tobacco Use  . Smoking status: Former Smoker    Packs/day: 1.00    Years: 12.00    Pack years: 12.00    Types: Cigarettes    Quit date: 1968    Years since quitting: 54.0  . Smokeless tobacco: Never Used  Vaping Use  . Vaping Use: Never used  Substance Use Topics  . Alcohol use: No  . Drug use: No    Review of Systems Per HPI unless specifically indicated above     Objective:    BP (!) 154/84   Pulse 73   Resp 16   Ht 6' (1.829 m)   Wt 225 lb (102.1 kg)   SpO2 96%   BMI 30.52 kg/m   Wt Readings from Last 3 Encounters:  01/03/20 225 lb (102.1 kg)  11/10/19 229 lb 9.6 oz (104.1 kg)  10/07/19 220 lb (99.8 kg)    Physical Exam Vitals and nursing note  reviewed.  Constitutional:      General: He is not in acute distress.    Appearance: He is well-developed and well-nourished. He is obese. He is not diaphoretic.     Comments: Well-appearing, comfortable, cooperative  HENT:     Head: Normocephalic and atraumatic.     Mouth/Throat:     Mouth: Oropharynx is clear and moist.  Eyes:     General:        Right eye: No discharge.        Left eye: No discharge.     Conjunctiva/sclera: Conjunctivae normal.  Neck:     Thyroid: No thyromegaly.  Cardiovascular:     Rate and Rhythm: Normal rate and regular rhythm.     Pulses: Intact distal pulses.     Heart sounds: Normal heart sounds. No murmur heard.   Pulmonary:     Effort: Pulmonary effort is normal. No respiratory distress.     Breath sounds: Normal breath sounds. No wheezing or rales.  Musculoskeletal:        General: Normal range of motion.     Cervical back: Normal range of motion and neck supple.     Right lower leg: Edema (+1 to 2 pitting edema feet to mid  lower extremity symmetrical, seems slightly improved today, has some dry skin dermatitis from edema on lower leg ankles) present.     Left lower leg: Edema present.  Lymphadenopathy:     Cervical: No cervical adenopathy.  Skin:    General: Skin is warm and dry.     Findings: No erythema or rash.  Neurological:     Mental Status: He is alert and oriented to person, place, and time.  Psychiatric:        Mood and Affect: Mood and affect normal.        Behavior: Behavior normal.     Comments: Well groomed, good eye contact, normal speech and thoughts       Results for orders placed or performed during the hospital encounter of 10/08/19  Urine Culture   Specimen: Urine, Random  Result Value Ref Range   Specimen Description      URINE, RANDOM Performed at [facility], [address], [place], [place] [address]    Special Requests      NONE Performed at [facility], [address],  [place], [place] [address]    Culture      NO GROWTH Performed at [facility], [place] [address] [place], [place] [address]    Report Status 10/09/2019 FINAL   Basic metabolic panel  Result Value Ref Range   Sodium 136 135 - 145 mmol/L   Potassium 3.6 3.5 - 5.1 mmol/L   Chloride 100 98 - 111 mmol/L   CO2 23 22 - 32 mmol/L   Glucose, Bld 105 (H) 70 - 99 mg/dL   BUN 22 8 - 23 mg/dL   Creatinine, Ser 0.90 0.61 - 1.24 mg/dL   Calcium 8.3 (L) 8.9 - 10.3 mg/dL   GFR calc non Af Amer >60 >60 mL/min   GFR calc Af Amer >60 >60 mL/min   Anion gap 13 5 - 15  CBC  Result Value Ref Range   WBC 5.1 4.0 - 10.5 K/uL   RBC 4.26 4.22 - 5.81 MIL/uL   Hemoglobin 13.0 13.0 - 17.0 g/dL   HCT 36.5 (L) 39.0 - 52.0 %   MCV 85.7 80.0 - 100.0 fL   MCH 30.5 26.0 - 34.0 pg   MCHC 35.6 30.0 - 36.0 g/dL   RDW 15.5 11.5 - 15.5 %   Platelets 151 150 - 400 K/uL   nRBC 0.0 0.0 - 0.2 %  Brain natriuretic peptide  Result Value Ref Range   B Natriuretic Peptide 27.8 0.0 - 100.0 pg/mL  C-reactive protein  Result Value Ref Range   CRP 6.1 (H) <1.0 mg/dL  Fibrin derivatives D-Dimer ([facility] only)  Result Value Ref Range   Fibrin derivatives D-dimer ([facility]) 955.10 (H) 0.00 - 499.00 ng/mL (FEU)  Ferritin  Result Value Ref Range   Ferritin 105 24 - 336 ng/mL  Fibrinogen  Result Value Ref Range   Fibrinogen 442 210 - 475 mg/dL  Hepatitis B surface antigen  Result Value Ref Range   Hepatitis B Surface Ag NON REACTIVE NON REACTIVE  Lactate dehydrogenase  Result Value Ref Range   LDH 194 (H) 98 - 192 U/L  Procalcitonin  Result Value Ref Range   Procalcitonin <0.10 ng/mL  CBC  Result Value Ref Range   WBC 4.5 4.0 - 10.5 K/uL   RBC 4.19 (L) 4.22 - 5.81 MIL/uL   Hemoglobin 12.5 (L) 13.0 - 17.0 g/dL   HCT 36.1 (L) 39.0 - 52.0 %   MCV  86.2 80.0 - 100.0 fL   MCH 29.8 26.0 - 34.0 pg   MCHC 34.6 30.0 - 36.0 g/dL   RDW 62.9 52.8 - 41.3 %   Platelets 144 (L) 150 - 400 K/uL   nRBC 0.0 0.0 - 0.2 %  Creatinine,  serum  Result Value Ref Range   Creatinine, Ser 1.02 0.61 - 1.24 mg/dL   GFR calc non Af Amer >60 >60 mL/min   GFR calc Af Amer >60 >60 mL/min  C-reactive protein  Result Value Ref Range   CRP 6.0 (H) <1.0 mg/dL  Fibrin derivatives D-Dimer ([facility] only)  Result Value Ref Range   Fibrin derivatives D-dimer ([facility]) 988.23 (H) 0.00 - 499.00 ng/mL (FEU)  Ferritin  Result Value Ref Range   Ferritin 179 24 - 336 ng/mL  Comprehensive metabolic panel  Result Value Ref Range   Sodium 138 135 - 145 mmol/L   Potassium 3.3 (L) 3.5 - 5.1 mmol/L   Chloride 99 98 - 111 mmol/L   CO2 27 22 - 32 mmol/L   Glucose, Bld 110 (H) 70 - 99 mg/dL   BUN 24 (H) 8 - 23 mg/dL   Creatinine, Ser 2.44 0.61 - 1.24 mg/dL   Calcium 8.5 (L) 8.9 - 10.3 mg/dL   Total Protein 7.3 6.5 - 8.1 g/dL   Albumin 3.5 3.5 - 5.0 g/dL   AST 53 (H) 15 - 41 U/L   ALT 8 0 - 44 U/L   Alkaline Phosphatase 55 38 - 126 U/L   Total Bilirubin 1.8 (H) 0.3 - 1.2 mg/dL   GFR calc non Af Amer >60 >60 mL/min   GFR calc Af Amer >60 >60 mL/min   Anion gap 12 5 - 15  C-reactive protein  Result Value Ref Range   CRP 6.0 (H) <1.0 mg/dL  Fibrin derivatives D-Dimer ([facility] only)  Result Value Ref Range   Fibrin derivatives D-dimer ([facility]) 949.78 (H) 0.00 - 499.00 ng/mL (FEU)  Ferritin  Result Value Ref Range   Ferritin 172 24 - 336 ng/mL  Comprehensive metabolic panel  Result Value Ref Range   Sodium 140 135 - 145 mmol/L   Potassium 3.3 (L) 3.5 - 5.1 mmol/L   Chloride 99 98 - 111 mmol/L   CO2 30 22 - 32 mmol/L   Glucose, Bld 103 (H) 70 - 99 mg/dL   BUN 28 (H) 8 - 23 mg/dL   Creatinine, Ser 0.10 0.61 - 1.24 mg/dL   Calcium 8.6 (L) 8.9 - 10.3 mg/dL   Total Protein 7.3 6.5 - 8.1 g/dL   Albumin 3.4 (L) 3.5 - 5.0 g/dL   AST 52 (H) 15 - 41 U/L   ALT 8 0 - 44 U/L   Alkaline Phosphatase 50 38 - 126 U/L   Total Bilirubin 1.9 (H) 0.3 - 1.2 mg/dL   GFR calc non Af Amer >60 >60 mL/min   GFR calc Af Amer >60 >60 mL/min   Anion gap 11 5 - 15   C-reactive protein  Result Value Ref Range   CRP 6.0 (H) <1.0 mg/dL  Fibrin derivatives D-Dimer ([facility] only)  Result Value Ref Range   Fibrin derivatives D-dimer ([facility]) 880.40 (H) 0.00 - 499.00 ng/mL (FEU)  Ferritin  Result Value Ref Range   Ferritin 197 24 - 336 ng/mL  Comprehensive metabolic panel  Result Value Ref Range   Sodium 141 135 - 145 mmol/L   Potassium 4.0 3.5 - 5.1 mmol/L   Chloride 102 98 - 111 mmol/L  CO2 27 22 - 32 mmol/L   Glucose, Bld 115 (H) 70 - 99 mg/dL   BUN 34 (H) 8 - 23 mg/dL   Creatinine, Ser 0.83 0.61 - 1.24 mg/dL   Calcium 9.1 8.9 - 10.3 mg/dL   Total Protein 7.4 6.5 - 8.1 g/dL   Albumin 3.5 3.5 - 5.0 g/dL   AST 45 (H) 15 - 41 U/L   ALT 9 0 - 44 U/L   Alkaline Phosphatase 56 38 - 126 U/L   Total Bilirubin 1.9 (H) 0.3 - 1.2 mg/dL   GFR calc non Af Amer >60 >60 mL/min   GFR calc Af Amer >60 >60 mL/min   Anion gap 12 5 - 15  Type and screen [place]  Result Value Ref Range   ABO/RH(D) O POS    Antibody Screen NEG    Sample Expiration      10/11/2019,2359 Performed at [facility], [address] [place], [place] [address]   Troponin I (High Sensitivity)  Result Value Ref Range   Troponin I (High Sensitivity) 15 <18 ng/L  Troponin I (High Sensitivity)  Result Value Ref Range   Troponin I (High Sensitivity) 13 <18 ng/L      Assessment & Plan:   Problem List Items Addressed This Visit    Essential hypertension   Relevant Medications   enalapril (VASOTEC) 10 MG tablet   Bilateral lower extremity edema - Primary      HTN - mild elevated today in office and similar to home BP readings 140-150 / 80-100 approx Other than LE edema, he has been asymptomatic from HTN.  OFF Amlodipine 2.5 to 5mg  due to LE edema  Back on ACEi Enalapril now but dose is low at 2.5mg  1-2 times daily  For now it seems edema management is stable, not worsening. Urine output reportedly seems relatively stable and low dose lasix  doesn't seem to be increasing it as much.  Will try to improve HTN management instead of adjusting his diuretic.  Increase Enalapril instead of 2.5mg  BID (which is what he was currently taking) - he should increase to Enalapril 10mg  ONCE DAILY for now. Use existing meds at home 2.5 x 4 = 10 or HALF of 20mg  tab.  Notify us 1-2 weeks if / when ready for new order on Enalapril 10mg  or if need we can offer 20mg  and a new rx.  Discussed Furosemide/lasix dosing again - he may continue MWF 3x week, or can increase PRN week if flare or 5-7 days if need  No orders of the defined types were placed in this encounter.    Follow up plan: Return if symptoms worsen or fail to improve, for 6 week to 3 month follow-up HTN.  Will recommend lab for Chemistry test in future by next apt 6 wk to 3 month, can do before or during visit if AM apt.  [name], [facility] 01/03/2020, 2:35 PM

## 2020-01-04 ENCOUNTER — Telehealth: Payer: Self-pay

## 2020-01-17 ENCOUNTER — Telehealth: Payer: Self-pay

## 2020-01-19 ENCOUNTER — Ambulatory Visit: Payer: Medicare Other | Admitting: Pharmacist

## 2020-01-19 DIAGNOSIS — I10 Essential (primary) hypertension: Secondary | ICD-10-CM

## 2020-01-19 DIAGNOSIS — R600 Localized edema: Secondary | ICD-10-CM

## 2020-01-19 NOTE — Chronic Care Management (AMB) (Signed)
Chronic Care Management Pharmacy Note  01/19/2020 Name:  Randy [name] MRN:  [id number] DOB:  03-06-1934  Subjective: Randy [name] is an 86 y.o. year old male who is a primary patient of [name], DO.  The CCM team was consulted for assistance with disease management and care coordination needs.    Engaged with daughter/caregiver by telephone for follow up visit in response to provider referral for pharmacy case management and/or care coordination services.   Consent to Services:  The patient was given information about Chronic Care Management services, agreed to services, and gave verbal consent prior to initiation of services.  Please see initial visit note for detailed documentation.   Objective:  Lab Results  Component Value Date   CREATININE 0.83 10/12/2019   CREATININE 0.71 10/11/2019   CREATININE 0.84 10/10/2019    Lab Results  Component Value Date   HGBA1C 5.4 06/10/2018       Component Value Date/Time   CHOL 176 06/10/2018 0800   CHOL 151 04/17/2015 0826   TRIG 98 06/10/2018 0800   TRIG 116 04/17/2015 0826   HDL 35 (L) 06/10/2018 0800   HDL 32 (A) 04/17/2015 0826   CHOLHDL 5.0 (H) 06/10/2018 0800   VLDL 27 05/27/2016 0856   LDLCALC 121 (H) 06/10/2018 0800    BP Readings from Last 3 Encounters:  01/03/20 (!) 154/84  11/10/19 (!) 141/78  10/12/19 132/78    Assessment/Interventions: Review of patient past medical history, allergies, medications, health status, including review of consultants reports, laboratory and other test data, was performed as part of comprehensive evaluation and provision of chronic care management services.   SDOH:  (Social Determinants of Health) assessments and interventions performed:    CCM Care Plan  Allergies  Allergen Reactions  . Antihistamines, Chlorpheniramine-Type Other (See Comments)    Can't urinate    Medications Reviewed Today    Reviewed by Vella Raring, [place] (Pharmacist) on 01/19/20 at  1656  Med List Status: <None>  Medication Order Taking? Sig Documenting Provider Last Dose Status Informant  carbidopa-levodopa (SINEMET IR) 25-100 MG tablet [id number] No Take 1 tablet by mouth 4 (four) times daily -  with meals and at bedtime. [name], MD Taking Active   cholecalciferol (VITAMIN D) 1000 units tablet [id number] No Take 1,000 Units by mouth daily. [provider] Taking Active Other  Coenzyme Q10 (CO Q 10) 100 MG CAPS [id number] No Take 100 mg by mouth daily.  [provider] Taking Active Other  diclofenac Sodium (VOLTAREN) 1 % GEL [id number] No Apply 2 g topically 4 (four) times daily as needed. To knee arthritis pain [name], DO Taking Active Pharmacy Records  docusate sodium (COLACE) 100 MG capsule [id number] No Take 1 capsule (100 mg total) by mouth 2 (two) times daily. [name], MD Taking Active   enalapril (VASOTEC) 10 MG tablet [id number]  Take 10 mg by mouth daily. (note has existing 2.5mg  tabs and 20mg  tabs, can use either to = 10mg ) [provider]  Active   fluticasone (FLONASE) 50 MCG/ACT nasal spray [id number] No Place 2 sprays into both nostrils daily as needed for allergies.  [provider] Taking Active Other           Med Note [name], [name]   Mon Oct 26, 2018  1:07 PM)    furosemide (LASIX) 20 MG tablet [id number] No Take 1 tablet (20 mg total) by mouth every other day. [name], DO Taking Active  Pharmacy Records           Med Note [facility], [facility] A   Fri Dec 17, 2019  9:48 AM) Taking on Mondays, Wednesdays and Fridays  melatonin 5 MG TABS [id number] No Take 5 mg by mouth at bedtime as needed (sleep). [provider] Taking Active Other  Multiple Vitamin (MULTIVITAMIN WITH MINERALS) TABS tablet [id number] No Take 1 tablet by mouth daily. [name], MD Taking Active   OLANZapine (ZYPREXA) 5 MG tablet [id number] No TAKE 1 TABLET (5 MG TOTAL) BY MOUTH AT BEDTIME AS NEEDED  (AGITATION). [name], DO Taking Active   Omega-3 Fatty Acids (FISH OIL) 1200 MG CAPS [id number] No Take 1,200 mg by mouth 2 (two) times daily.  [provider] Taking Active Other  omeprazole (PRILOSEC) 20 MG capsule [id number] No Take 20 mg by mouth daily. [provider] Taking Active Other  rivastigmine (EXELON) 1.5 MG capsule [id number] No Take 1 capsule by mouth 2 (two) times daily with a meal. [provider] Taking Active   vitamin C (ASCORBIC ACID) 500 MG tablet [id number] No Take 500 mg by mouth daily. [provider] Taking Active Other  zinc sulfate 220 (50 Zn) MG capsule [id number] No Take 1 capsule (220 mg total) by mouth daily. [name], MD Taking Active           Patient Active Problem List   Diagnosis Date Noted  . Pressure injury of buttock, stage 1   . Delirium   . COVID-19 virus infection 10/08/2019  . Unable to care for self 10/08/2019  . Caregiver unable to cope 10/08/2019  . Recurrent falls 10/08/2019  . Lewy body dementia without behavioral disturbance ([place])   . Prostate cancer ([place]) 06/30/2019  . BPH with urinary obstruction 11/06/2018  . Acute cholecystitis 09/08/2018  . Myalgia due to statin 02/24/2018  . Tricompartment osteoarthritis of left knee 11/14/2017  . Generalized weakness 04/28/2017  . Chronic low back pain 03/12/2017  . Knee pain, chronic 10/01/2016  . Chronic venous insufficiency 08/30/2016  . BPH without obstruction/lower urinary tract symptoms 03/06/2016  . Osteoarthritis of multiple joints 03/06/2016  . Degenerative joint disease (DJD) of lumbar spine 03/06/2016  . Essential hypertension 03/05/2016  . Hyperlipidemia 03/05/2016  . Osteoporosis 03/05/2016  . GERD (gastroesophageal reflux disease) 03/05/2016  . Bilateral lower extremity edema 03/05/2016    Conditions to be addressed/monitored: HTN and memory loss  Care Plan : PharmD - Medication Managment  Updates made by Vella Raring, RPH since 01/19/2020 12:00 AM    Problem: Disease Progression     Long-Range Goal: Disease Progression Prevented or Minimized   Start Date: 01/19/2020  Expected End Date: 04/18/2020  This Visit's Progress: On track  Priority: High  Note:   Current Barriers:  . Chronic Disease Management support, education, and care coordination needs related to hypertension, osteoarthritis, hyperlipidemia, insomnia and memory loss . Cognitive Deficits  Pharmacist Clinical Goal(s):  [name] Over the next 90 days, caregiver/patient will adhere to plan to optimize therapeutic regimen for hypertension as evidenced by report of adherence to recommended medication management changes through collaboration with PharmD and provider.   Interventions: . 1:1 collaboration with [name], DO regarding development and update of comprehensive plan of care as evidenced by provider attestation and co-signature . Inter-disciplinary care team collaboration (see longitudinal plan of care)  Hypertension . Collaborated with PCP. Note patient seen by PCP on 12/27 for evaluation of lower extremity edema and HTN. Patient advised to:  o Increase dose of enalapril to 10 mg once daily o Continue current dose of furosemide 20 mg daily on Mondays, Wednesdays or Fridays (or can increase PRN week if flare or 5-7 days if need) . Today identify daughter/caregiver misunderstood direction for enalapril dose change . Reports patient taking: o Enalapril 2.5 mg - 2 tablets (5 mg) daily in the morning o Furosemide 20 mg on Mondays, Wednesdays and Fridays . Reports has not checked patient's blood pressure this week, but reports previous week's readings ranging ~140-160/80-110 . Daughter/caregiver states will adjust patient's enalapril dose as previously directed by PCP to enalapril 10 mg daily.  o Confirms will use existing supply of enalapril 2.5 mg, 4 tablets (10 mg total) daily for now. Myles Rosenthal on rational/importance  of monitoring home blood pressure and keeping log of results to review during next telephone appointment o Counsel daughter to call office sooner for reading outside of established parameters or for new or worsening symptoms . Reports swelling in patient's legs similar to when patient last seen by PCP. Reports patient elevating legs as prompted by family members . Schedule follow up telephone appointment in 1 week to review blood pressure readings . Email caregiver blood pressure log and educational handout on blood pressure monitoring   Patient Goals/Self-Care Activities . Over the next 90 days, caregiver/patient will:  - take medications as prescribed - check blood pressure, document, and provide at future appointments  Follow Up Plan: Telephone follow up appointment with care management team member scheduled for: 01/26/2020 at 8:45 AM     Follow Up:  Patient agrees to Care Plan and Follow-up.  [name], PharmD, [place] [phone]

## 2020-01-19 NOTE — Patient Instructions (Signed)
Visit Information  Patient Care Plan: PharmD - Medication Managment    Problem Identified: Disease Progression     Long-Range Goal: Disease Progression Prevented or Minimized   Start Date: 01/19/2020  Expected End Date: 04/18/2020  This Visit's Progress: On track  Priority: High  Note:   Current Barriers:  . Chronic Disease Management support, education, and care coordination needs related to hypertension, osteoarthritis, hyperlipidemia, insomnia and memory loss . Cognitive Deficits  Pharmacist Clinical Goal(s):  [name] Over the next 90 days, caregiver/patient will adhere to plan to optimize therapeutic regimen for hypertension as evidenced by report of adherence to recommended medication management changes through collaboration with PharmD and provider.   Interventions: . 1:1 collaboration with [name], DO regarding development and update of comprehensive plan of care as evidenced by provider attestation and co-signature . Inter-disciplinary care team collaboration (see longitudinal plan of care)  Hypertension . Collaborated with PCP. Note patient seen by PCP on 12/27 for evaluation of lower extremity edema and HTN. Patient advised to: o Increase dose of enalapril to 10 mg once daily o Continue current dose of furosemide 20 mg daily on Mondays, Wednesdays or Fridays (or can increase PRN week if flare or 5-7 days if need) . Today identify daughter/caregiver misunderstood direction for enalapril dose change . Reports patient taking: o Enalapril 2.5 mg - 2 tablets (5 mg) daily in the morning o Furosemide 20 mg on Mondays, Wednesdays and Fridays . Reports has not checked patient's blood pressure this week, but reports previous week's readings ranging ~140-160/80-110 . Daughter/caregiver states will adjust patient's enalapril dose as previously directed by PCP to enalapril 10 mg daily.  o Confirms will use existing supply of enalapril 2.5 mg, 4 tablets (10 mg total) daily for  now. Myles Rosenthal on rational/importance of monitoring home blood pressure and keeping log of results to review during next telephone appointment o Counsel daughter to call office sooner for reading outside of established parameters or for new or worsening symptoms . Reports swelling in patient's legs similar to when patient last seen by PCP. Reports patient elevating legs as prompted by family members . Schedule follow up telephone appointment in 1 week to review blood pressure readings . Email caregiver blood pressure log and educational handout on blood pressure monitoring   Patient Goals/Self-Care Activities . Over the next 90 days, caregiver/patient will:  - take medications as prescribed - check blood pressure, document, and provide at future appointments  Follow Up Plan: Telephone follow up appointment with care management team member scheduled for: 01/26/2020 at 8:45 AM      The patient verbalized understanding of instructions, educational materials, and care plan provided today and declined offer to receive copy of patient instructions, educational materials, and care plan.   [name], PharmD, [place] [phone]

## 2020-01-26 ENCOUNTER — Other Ambulatory Visit: Payer: Self-pay | Admitting: Family Medicine

## 2020-01-26 ENCOUNTER — Ambulatory Visit: Payer: Medicare Other | Admitting: Pharmacist

## 2020-01-26 DIAGNOSIS — R600 Localized edema: Secondary | ICD-10-CM

## 2020-01-26 DIAGNOSIS — I10 Essential (primary) hypertension: Secondary | ICD-10-CM

## 2020-01-26 MED ORDER — ENALAPRIL MALEATE 10 MG PO TABS: 10.0000 mg | ORAL_TABLET | Freq: Every day | ORAL | 2 refills | Status: DC

## 2020-01-26 NOTE — Chronic Care Management (AMB) (Signed)
Chronic Care Management Pharmacy Note  01/26/2020 Name:  RENNER SEBALD MRN:  [id number] DOB:  1934-01-10  Subjective: Randy [name] is an 86 y.o. year old male who is a primary patient of [name], DO.  The CCM team was consulted for assistance with disease management and care coordination needs.    Engaged with patient by telephone for follow up visit in response to provider referral for pharmacy case management and/or care coordination services.   Consent to Services:  The patient was given information about Chronic Care Management services, agreed to services, and gave verbal consent prior to initiation of services.  Please see initial visit note for detailed documentation.   Objective:  Lab Results  Component Value Date   CREATININE 0.83 10/12/2019   CREATININE 0.71 10/11/2019   CREATININE 0.84 10/10/2019    Lab Results  Component Value Date   HGBA1C 5.4 06/10/2018       Component Value Date/Time   CHOL 176 06/10/2018 0800   CHOL 151 04/17/2015 0826   TRIG 98 06/10/2018 0800   TRIG 116 04/17/2015 0826   HDL 35 (L) 06/10/2018 0800   HDL 32 (A) 04/17/2015 0826   CHOLHDL 5.0 (H) 06/10/2018 0800   VLDL 27 05/27/2016 0856   LDLCALC 121 (H) 06/10/2018 0800    BP Readings from Last 3 Encounters:  01/03/20 (!) 154/84  11/10/19 (!) 141/78  10/12/19 132/78    Assessment: Review of patient past medical history, allergies, medications, health status, including review of consultants reports, laboratory and other test data, was performed as part of comprehensive evaluation and provision of chronic care management services.   SDOH:  (Social Determinants of Health) assessments and interventions performed:    CCM Care Plan  Allergies  Allergen Reactions  . Antihistamines, Chlorpheniramine-Type Other (See Comments)    Can't urinate    Medications Reviewed Today    Reviewed by Vella Raring, [place] (Pharmacist) on 01/19/20 at 1656  Med List Status:  <None>  Medication Order Taking? Sig Documenting Provider Last Dose Status Informant  carbidopa-levodopa (SINEMET IR) 25-100 MG tablet [id number] No Take 1 tablet by mouth 4 (four) times daily -  with meals and at bedtime. [name], MD Taking Active   cholecalciferol (VITAMIN D) 1000 units tablet [id number] No Take 1,000 Units by mouth daily. [provider] Taking Active Other  Coenzyme Q10 (CO Q 10) 100 MG CAPS [id number] No Take 100 mg by mouth daily.  [provider] Taking Active Other  diclofenac Sodium (VOLTAREN) 1 % GEL [id number] No Apply 2 g topically 4 (four) times daily as needed. To knee arthritis pain [name], DO Taking Active Pharmacy Records  docusate sodium (COLACE) 100 MG capsule [id number] No Take 1 capsule (100 mg total) by mouth 2 (two) times daily. [name], MD Taking Active   enalapril (VASOTEC) 10 MG tablet [id number]  Take 10 mg by mouth daily. (note has existing 2.5mg  tabs and 20mg  tabs, can use either to = 10mg ) [provider]  Active   fluticasone (FLONASE) 50 MCG/ACT nasal spray [id number] No Place 2 sprays into both nostrils daily as needed for allergies.  [provider] Taking Active Other           Med Note [name], [name]   Mon Oct 26, 2018  1:07 PM)    furosemide (LASIX) 20 MG tablet [id number] No Take 1 tablet (20 mg total) by mouth every other day. [name], DO Taking Active  Pharmacy Records           Med Note [facility] A   Fri Dec 17, 2019  9:48 AM) Taking on Mondays, Wednesdays and Fridays  melatonin 5 MG TABS [id number] No Take 5 mg by mouth at bedtime as needed (sleep). [provider] Taking Active Other  Multiple Vitamin (MULTIVITAMIN WITH MINERALS) TABS tablet [id number] No Take 1 tablet by mouth daily. [name], MD Taking Active   OLANZapine (ZYPREXA) 5 MG tablet [id number] No TAKE 1 TABLET (5 MG TOTAL) BY MOUTH AT BEDTIME AS NEEDED (AGITATION).  [name], DO Taking Active   Omega-3 Fatty Acids (FISH OIL) 1200 MG CAPS [id number] No Take 1,200 mg by mouth 2 (two) times daily.  [provider] Taking Active Other  omeprazole (PRILOSEC) 20 MG capsule [id number] No Take 20 mg by mouth daily. [provider] Taking Active Other  rivastigmine (EXELON) 1.5 MG capsule [id number] No Take 1 capsule by mouth 2 (two) times daily with a meal. [provider] Taking Active   vitamin C (ASCORBIC ACID) 500 MG tablet [id number] No Take 500 mg by mouth daily. [provider] Taking Active Other  zinc sulfate 220 (50 Zn) MG capsule [id number] No Take 1 capsule (220 mg total) by mouth daily. [name], MD Taking Active           Patient Active Problem List   Diagnosis Date Noted  . Pressure injury of buttock, stage 1   . Delirium   . COVID-19 virus infection 10/08/2019  . Unable to care for self 10/08/2019  . Caregiver unable to cope 10/08/2019  . Recurrent falls 10/08/2019  . Lewy body dementia without behavioral disturbance ([place])   . Prostate cancer ([place]) 06/30/2019  . BPH with urinary obstruction 11/06/2018  . Acute cholecystitis 09/08/2018  . Myalgia due to statin 02/24/2018  . Tricompartment osteoarthritis of left knee 11/14/2017  . Generalized weakness 04/28/2017  . Chronic low back pain 03/12/2017  . Knee pain, chronic 10/01/2016  . Chronic venous insufficiency 08/30/2016  . BPH without obstruction/lower urinary tract symptoms 03/06/2016  . Osteoarthritis of multiple joints 03/06/2016  . Degenerative joint disease (DJD) of lumbar spine 03/06/2016  . Essential hypertension 03/05/2016  . Hyperlipidemia 03/05/2016  . Osteoporosis 03/05/2016  . GERD (gastroesophageal reflux disease) 03/05/2016  . Bilateral lower extremity edema 03/05/2016    Conditions to be addressed/monitored: HTN, medication adherence  Care Plan : PharmD - Medication Managment  Updates made by Vella Raring, [place] since 01/26/2020 12:00 AM    Problem: Disease Progression     Long-Range Goal: Disease Progression Prevented or Minimized   Start Date: 01/19/2020  Expected End Date: 04/18/2020  Recent Progress: On track  Priority: High  Note:   Current Barriers:  . Chronic Disease Management support, education, and care coordination needs related to hypertension, osteoarthritis, hyperlipidemia, insomnia and memory loss . Cognitive Deficits  Pharmacist Clinical Goal(s):  [name] Over the next 90 days, caregiver/patient will adhere to plan to optimize therapeutic regimen for hypertension as evidenced by report of adherence to recommended medication management changes through collaboration with PharmD and provider.   Interventions: . 1:1 collaboration with [name], DO regarding development and update of comprehensive plan of care as evidenced by provider attestation and co-signature . Inter-disciplinary care team collaboration (see longitudinal plan of care)  Hypertension . Reports patient taking: o Enalapril 2.5 mg - 4 tablets (10 mg) daily in the morning o Furosemide 20 mg on Mondays, Wednesdays  and Fridays . Reports home BP readings from past week:  AM PM Notes  13 - January - 158/100, HR 62   14 - January - 132/91, HR 86   15 - January - -   16 - January 145/97, HR 80 -   17 - January - -   18 - January - -   19 - January - -    . Reports patient has difficulty with self-monitoring and is unable to consistently recall to rest prior to taking readings. Myles Rosenthal on rational/importance of monitoring home blood pressure and keeping log of results to review during next telephone appointment o Daughter states that she is able to help patient with home monitoring o Counsel daughter to call office sooner for reading outside of established parameters or for new or worsening symptoms . Reports swelling in patient's legs similar to when patient last seen by PCP. o Reports  family continues to encourage patient to elevate feet, but unable to remember to do on his own o Has reported patient unable to use compression stockings due to physical/memory difficulty using tools/strategies that previously worked to getting them on . Daughter/caregiver reports patient has almost used up previous supply of enalapril tablets.  o Will collaborate with PCP and request new Rx be sent to pharmacy  Medication Adherence . Note patient using weekly pillbox with slots for four times daily dosing . Reports patient continues to have difficulty with consistently remembering fourth dose of carbidopa/levodopa, despite assistance from family members . Discuss with caregiver additional strategies for using medication alarm as adherence aid    Patient Goals/Self-Care Activities . Over the next 90 days, caregiver/patient will:  - take medications as prescribed - check blood pressure, document, and provide at future appointments  Follow Up Plan: Telephone follow up appointment with care management team member scheduled for: 2/2 at 8:45 am      Follow Up:  Caregiver agrees to Care Plan and Follow-up.  [name], PharmD, [place] [phone]

## 2020-01-26 NOTE — Patient Instructions (Signed)
Visit Information  Patient Care Plan: General Social Work (Adult)    Problem Identified: Caregiver Stress     Goal: Caregiver Coping Optimized for Daughter   Start Date: 12/28/2019  Priority: Medium  Note:   Evidence-based guidance:   Recognize and validate the complex nature of dementia, as well as the impact on the caregiver and extended family; provide education and support to align with needs and stage of dementia.   Acknowledge feelings of grief associated with dementia such as loss of relationship, recreational activities, future with patient and loss associated with out-of-home placement.   Encourage verbalization of feelings without ruminating.   Discuss services that may help balance the caregiving role and living the life he/she chooses such as professionally or peer-led support groups, web-based support, counseling and services such as respite care or in-home help.   Refer for or provide psychoeducation activities such as cognitive reframing to improve family/caregiver quality of life, wellbeing, confidence, perception of burden, mental health and self-efficacy.   Encourage use of individualized coping strategies that may include yoga, spiritual support, distraction, exercise, relaxation, music, massage, music therapy and outdoor activities to utilize nature's restorative properties.   Suggest home-monitoring system that may reduce family/caregiver worry and improve sleep.   Provide proactive acknowledgement of potential for depressive symptoms to appear; normalize response to burden of caregiving; refer to primary care provider or for mental health services.   Notes:    Task: Recognize and Manage Caregiver Stress   Note:   Care Management Activities:    - active listening utilized - caregiver stress acknowledged - complementary therapy use encouraged - consideration of in-home help encouraged - decision-making supported - engagement with primary care provider  encouraged - healthy lifestyle promoted - mental health treatment facilitated - participation in support group encouraged - positive reinforcement provided - self-reflection promoted - social and community activities encouraged - spiritual activity use promoted - support group information provided - verbalization of feelings encouraged    Notes:    Patient Care Plan: PharmD - Medication Managment    Problem Identified: Disease Progression     Long-Range Goal: Disease Progression Prevented or Minimized   Start Date: 01/19/2020  Expected End Date: 04/18/2020  Recent Progress: On track  Priority: High  Note:   Current Barriers:  . Chronic Disease Management support, education, and care coordination needs related to hypertension, osteoarthritis, hyperlipidemia, insomnia and memory loss . Cognitive Deficits  Pharmacist Clinical Goal(s):  [name] Over the next 90 days, caregiver/patient will adhere to plan to optimize therapeutic regimen for hypertension as evidenced by report of adherence to recommended medication management changes through collaboration with PharmD and provider.   Interventions: . 1:1 collaboration with [name], DO regarding development and update of comprehensive plan of care as evidenced by provider attestation and co-signature . Inter-disciplinary care team collaboration (see longitudinal plan of care)  Hypertension . Reports patient taking: o Enalapril 2.5 mg - 4 tablets (10 mg) daily in the morning o Furosemide 20 mg on Mondays, Wednesdays and Fridays . Reports home BP readings from past week:  AM PM Notes  13 - January - 158/100, HR 62   14 - January - 132/91, HR 86   15 - January - -   16 - January 145/97, HR 80 -   17 - January - -   18 - January - -   19 - January - -    . Reports patient has difficulty with self-monitoring and is unable to recall  to rest prior to taking readings. Myles Rosenthal on rational/importance of monitoring home blood  pressure and keeping log of results to review during next telephone appointment o Daughter states that she is able to help patient with home monitoring o Counsel daughter to call office sooner for reading outside of established parameters or for new or worsening symptoms . Reports swelling in patient's legs similar to when patient last seen by PCP. o Reports family continue to encourage patient to elevate feet, but unable to remember to do on his own o Has reported patient unable to use compression stockings due to physical/memory difficulty using tools/strategies that previously worked to getting them on . Daughter/caregiver reports patient has almost used up previous supply of enalapril tablets.  o Will collaborate with PCP and request new Rx be sent to pharmacy  Medication Adherence . Note patient using weekly pillbox with slots for four times daily dosing . Reports patient continues to have difficulty with consistently remembering fourth dose of carbidopa/levodopa, despite assistance from family members . Discuss with caregiver additional strategies for using medication alarm as adherence aid    Patient Goals/Self-Care Activities . Over the next 90 days, caregiver/patient will:  - take medications as prescribed - check blood pressure, document, and provide at future appointments  Follow Up Plan: Telephone follow up appointment with care management team member scheduled for: 2/2 at 8:45 am      The patient verbalized understanding of instructions, educational materials, and care plan provided today and declined offer to receive copy of patient instructions, educational materials, and care plan.   [name], PharmD, [place] [phone]

## 2020-01-27 ENCOUNTER — Ambulatory Visit: Payer: Self-pay | Admitting: General Practice

## 2020-01-27 ENCOUNTER — Telehealth: Payer: Self-pay | Admitting: General Practice

## 2020-01-27 DIAGNOSIS — Z789 Other specified health status: Secondary | ICD-10-CM

## 2020-01-27 DIAGNOSIS — E782 Mixed hyperlipidemia: Secondary | ICD-10-CM

## 2020-01-27 DIAGNOSIS — F0280 Dementia in other diseases classified elsewhere without behavioral disturbance: Secondary | ICD-10-CM

## 2020-01-27 DIAGNOSIS — R413 Other amnesia: Secondary | ICD-10-CM

## 2020-01-27 DIAGNOSIS — I10 Essential (primary) hypertension: Secondary | ICD-10-CM

## 2020-01-27 DIAGNOSIS — G3183 Dementia with Lewy bodies: Secondary | ICD-10-CM

## 2020-01-27 NOTE — Patient Instructions (Signed)
Visit Information  Goals Addressed              This Visit's Progress   .  COMPLETED: RNCM: Pt's daughter- "the pain is better for a few days after he gets the shots" (pt-stated)        CARE PLAN ENTRY (see longitudinal plan of care for additional care plan information)  Current Barriers: Closing this goal and opening in new ELS . Knowledge Deficits related to pain relief in patient with chronic knee pain . Care Coordination needs related to pain control in a patient with Chronic knee pain (disease states)  Nurse Case Manager Clinical Goal(s):  [name] Over the next 120 days, patient will verbalize understanding of plan for pain management for chronic knee pain . Over the next 120 days, patient will work with [facility] [place] team, and pcp to address needs related to pain in bilateral knees and best treatment options . Over the next 120 days, patient will demonstrate a decrease in pain exacerbations as evidenced by controlled level of pain in bilateral knees . Over the next 120 days, patient will demonstrate improved health management independence as evidenced byno falls, ability to do normal activities and improved quality of life  Interventions:  . Inter-disciplinary care team collaboration (see longitudinal plan of care) . Evaluation of current treatment plan related to pain management  and patient's adherence to plan as established by provider. 10-04-2019: The patient is having worsening knee pain and is to the point where he can not walk well at all. Denies any new falls.  The patients daughter was getting to call rescue to have the patient transported to the hospital to be evaluated for several things. She was allowing the patient to finish eating. 12-30-2019: The patient is no longer working with PT any longer. The daughter says he was not progressing. The patient is shuffling his feet still. The patients daughter states that he has not had any falls and she is mindful of safety.  . Advised  patient to call the provider for worsening sx/sx of uncontrolled pain or increased level of intensity of pain.   . Provided education to patient re: alternative pain relief measures.  . Reviewed medications with patient and discussed compliance.  The patient states that the shots received on 7-12 were helpful for pain relief. This work for a few days and then the patient generally complains again with pain.  The patient has had this for a while per the daughter and the topical NSAIDs do work and help also. 12-30-2019: The patient saw the specialist on 12-16-2019 and he changed a medications. The daughter states that she sees some improvement. The patient is stable at this time. She knows to call for changes or new concerns. . Discussed plans with patient for ongoing care management follow up and provided patient with direct contact information for care management team  Patient Self Care Activities:  . Patient verbalizes understanding of plan to manage pain of bilateral knees by working with the pcp and CCM team . Calls provider office for new concerns or questions . Unable to independently manage pain and discomfort of bilateral knees  Please see past updates related to this goal by clicking on the "Past Updates" button in the selected goal      .  COMPLETED: RNCM: Recently Dad has had swelling in both of his legs and feet        Current Barriers: Closing this goal and opening in new ELS . Knowledge Deficits  related to management of HTN as evidence of swelling in feet and legs and HLD,  not always being able to eat a heart healthy diet . Cognitive Deficits  Nurse Case Manager Clinical Goal(s):  [name] Over the next 90 days, patient will verbalize understanding of plan for managing HTN and HLD . Over the next 90 days, patient will work with [facility] [place] pharmacist and pcp to address needs related to managing HTN and increased swelling in feet and legs . Over the next 90 days, patient will demonstrate a  decrease in bilateral lower extremity edema exacerbations as evidenced by normalized blood pressures and no evidence of edema present in bilateral lower legs . Over the next 90 days, patient will demonstrate improved adherence to prescribed treatment plan for wearing knee high ted hose as evidenced bydecreased bilateral lower leg edema . Over the next 90 days, patient will work with CM team pharmacist to review medications as needed and take Lasix as prescribed by pcp . Over the next 90 days, the patient will demonstrate ongoing self health care management ability as evidenced by elevating legs when sitting, taking meds as prescribed, following a heart healthy diet, and wearing ted hose to reduce bilateral lower leg edema.  Interventions:  . Evaluation of current treatment plan related to HTN and HLD and patient's adherence to plan as established by provider.  11-08-2019: The patient is home from rehab and working with PT. Has post discharge appointment with pcp on 11-10-2019.  The daughter states they have not been taking his blood pressure at home but will start back getting a routine going.  They did make changes to a medication but she can not remember what it was.  Advised to bring all paperwork with the patient to the pcp visit on 11-10-2019.  12-30-2019: The patient is no longer working with PT. He was not improving and is still shuffling with his feet. They discharged him from services.  The patients daughter states he does not seem to be urinating as often as he was and she has noticed more swelling in his legs. Blood pressures have been elevated. Collaboration with the pcp and pharmacist on 12-29-2019 has resulted in change of medication for the patient. The daughter has a good understanding of medication changes and knows to call if the blood pressure is not coming down. Review of calling office for systolic [id number] and diastolic [id number]. Recommended that she try to get an appointment to see Dr. Parks Ranger  soon to evaluate and give recommendations.  . Provided education to patient re: best way to put on ted hose with the use of a plastic grocery bag for ease of putting on hose. Also alternative compression socks" found on [place] or at [place] that my be beneficial for the patient. 10-04-2019: The patients daughter states that the patient is still having issues putting on the ted hose even with the ideas given to them with the grocery bags.  The daughter did get the patient some diabetic socks.He can put those on. Education given on elevation of legs when sitting and to try ace wraps, just make sure they are not too tight around his legs. The patient is having worsening swelling today and also shortness of breath. The patients daughter was out of town this weekend but endorses compliance with medications. The patient has several health issues impacting his care right now. 12-30-2019: Education on monitoring for extra swelling and po intake and urinary output. Also discussed elevation of legs and feet when  sitting down and to try to use compression hose, ace wraps, or compression socks to help with the extra fluid on board. Education on monitoring sodium and fats content and to monitor for hidden sodium in foods. The patients daughter verbalized understanding.  . Reviewed medications with patient and discussed the use of Lasix. The daughter verbalized the patient did not have any Lasix. The patient has been started back on Lasix and per the daughter today the Lasix has been effective in helping with the swelling and she has noted decrease in swelling. 12-30-2019: The patient is taking Lasix every other day. Her mom told her yesterday that she has noticed the patient not going to the bathroom as often during the day. She has been noticing increased swelling. Will collaborate with the pcp with the daughters concerns. Recommended she call the office to get an appointment to see the pcp asap for evaluation and treatment  recommendations. Will notify the pharmacist and pcp of increased swelling in legs and feet.  . Discussed plans with patient for ongoing care management follow up and provided patient with direct contact information for care management team . Advised patient, providing education and rationale, to monitor blood pressure daily and record, calling pcp for findings outside established parameters.  . Provided patient with heart healthy  educational materials related to adhering to heart healthy diet  . Pharmacy referral for medication change follow up and support . Educated daughter on the [place] functionality to view notes/lab work and communicate with the pcp team  Patient Self Care Activities:  . Patient verbalizes understanding of plan to use Lasix prn for swelling in feet and legs per MD recommendations . Attends all scheduled provider appointments . Calls provider office for new concerns or questions . Unable to independently manage bilateral lower leg edema  Please see past updates related to this goal by clicking on the "Past Updates" button in the selected goal         Patient verbalizes understanding of instructions provided today.  Telephone follow up appointment with care management team member scheduled for: 03-16-2020 at [place] am  Noreene Larsson RN, MSN, [place] Mobile: [phone]

## 2020-01-27 NOTE — Chronic Care Management (AMB) (Signed)
Chronic Care Management   CCM RN Visit Note  01/27/2020 Name: Randy [name] MRN: [id number] DOB: Jun 06, 1934  Subjective: Randy [name] is a 86 y.o. year old male who is a primary care patient of [name], DO. The care management team was consulted for assistance with disease management and care coordination needs.    Engaged with patient by telephone for follow up visit in response to provider referral for case management and/or care coordination services.   Consent to Services:  Currently engaged with CCM services   Patient agreed to services and verbal consent obtained.   Assessment: Review of patient past medical history, allergies, medications, health status, including review of consultants reports, laboratory and other test data, was performed as part of comprehensive evaluation and provision of chronic care management services.   SDOH (Social Determinants of Health) assessments and interventions performed:    CCM Care Plan  Allergies  Allergen Reactions   Antihistamines, Chlorpheniramine-Type Other (See Comments)    Can't urinate    Outpatient Encounter Medications as of 01/27/2020  Medication Sig Note   carbidopa-levodopa (SINEMET IR) 25-100 MG tablet Take 1 tablet by mouth 4 (four) times daily -  with meals and at bedtime.    cholecalciferol (VITAMIN D) 1000 units tablet Take 1,000 Units by mouth daily.    Coenzyme Q10 (CO Q 10) 100 MG CAPS Take 100 mg by mouth daily.     diclofenac Sodium (VOLTAREN) 1 % GEL Apply 2 g topically 4 (four) times daily as needed. To knee arthritis pain    docusate sodium (COLACE) 100 MG capsule Take 1 capsule (100 mg total) by mouth 2 (two) times daily.    enalapril (VASOTEC) 10 MG tablet Take 1 tablet (10 mg total) by mouth daily.    fluticasone (FLONASE) 50 MCG/ACT nasal spray Place 2 sprays into both nostrils daily as needed for allergies.     furosemide (LASIX) 20 MG tablet Take 1 tablet (20 mg total) by mouth every  other day. 12/17/2019: Taking on Mondays, Wednesdays and Fridays   melatonin 5 MG TABS Take 5 mg by mouth at bedtime as needed (sleep).    Multiple Vitamin (MULTIVITAMIN WITH MINERALS) TABS tablet Take 1 tablet by mouth daily.    OLANZapine (ZYPREXA) 5 MG tablet TAKE 1 TABLET (5 MG TOTAL) BY MOUTH AT BEDTIME AS NEEDED (AGITATION).    Omega-3 Fatty Acids (FISH OIL) 1200 MG CAPS Take 1,200 mg by mouth 2 (two) times daily.     omeprazole (PRILOSEC) 20 MG capsule Take 20 mg by mouth daily.    rivastigmine (EXELON) 1.5 MG capsule Take 1 capsule by mouth 2 (two) times daily with a meal.    vitamin C (ASCORBIC ACID) 500 MG tablet Take 500 mg by mouth daily.    zinc sulfate 220 (50 Zn) MG capsule Take 1 capsule (220 mg total) by mouth daily.    No facility-administered encounter medications on file as of 01/27/2020.    Patient Active Problem List   Diagnosis Date Noted   Pressure injury of buttock, stage 1    Delirium    COVID-19 virus infection 10/08/2019   Unable to care for self 10/08/2019   Caregiver unable to cope 10/08/2019   Recurrent falls 10/08/2019   Lewy body dementia without behavioral disturbance ([place])    Prostate cancer ([place]) 06/30/2019   BPH with urinary obstruction 11/06/2018   Acute cholecystitis 09/08/2018   Myalgia due to statin 02/24/2018   Tricompartment osteoarthritis of left  knee 11/14/2017   Generalized weakness 04/28/2017   Chronic low back pain 03/12/2017   Knee pain, chronic 10/01/2016   Chronic venous insufficiency 08/30/2016   BPH without obstruction/lower urinary tract symptoms 03/06/2016   Osteoarthritis of multiple joints 03/06/2016   Degenerative joint disease (DJD) of lumbar spine 03/06/2016   Essential hypertension 03/05/2016   Hyperlipidemia 03/05/2016   Osteoporosis 03/05/2016   GERD (gastroesophageal reflux disease) 03/05/2016   Bilateral lower extremity edema 03/05/2016    Conditions to be addressed/monitored:HTN,  HLD, Dementia and Memory loss  Care Plan : RNCM: Dementia (Adult)  Updates made by Vanita Ingles since 01/27/2020 12:00 AM    Problem: RNCM: Management of Dementia and Memory Loss   Priority: Medium    Goal: RNCM: Management of Dementia and Memory Loss   Priority: Medium  Note:   Current Barriers:   Knowledge Deficits related to resources available for patient with dementia and memory loss   Lacks caregiver support.   Cognitive Deficits  Unable to independently manage changes related to dementia and memory loss   Unable to self administer medications as prescribed  Does not adhere to prescribed medication regimen  Lacks social connections  Unable to perform ADLs independently  Unable to perform IADLs independently  Does not contact provider office for questions/concerns  Nurse Case Manager Clinical Goal(s):   Over the next 120 days, patient will verbalize understanding of plan for effective management of dementia and memory loss   Over the next 120 days, patient will attend all scheduled medical appointments: no upcoming appointments but the patients daughter knows to call for changes or needs   Over the next 120 days, patient will demonstrate improved adherence to prescribed treatment plan for dementia and memory loss as evidenced bypatient being as independent as possible and working with CCM team to optimize health and well being related to dementia and memory loss.   Interventions:   1:1 collaboration with [name], DO regarding development and update of comprehensive plan of care as evidenced by provider attestation and co-signature  Inter-disciplinary care team collaboration (see longitudinal plan of care)  Evaluation of current treatment plan related to dementia and memoryt loss  and patient's adherence to plan as established by provider.  Advised patient to call the office for changes in condition or questions   Provided education to patient  re: caregiver burn out and support available   Discussed plans with patient for ongoing care management follow up and provided patient with direct contact information for care management team  Provided patient with dementia and memory loss  educational materials related to changes in memory. The daughter states that the patient seems to be in a "better mood" right now.   Patient Goals/Self-Care Activities Over the next 120 days, patient will:  - Patient will self administer medications as prescribed Patient will attend all scheduled provider appointments Patient will call pharmacy for medication refills Patient will attend church or other social activities Patient will continue to perform ADL's independently Patient will call provider office for new concerns or questions Patient will work with BSW to address care coordination needs and will continue to work with the clinical team to address health care and disease management related needs.   - action plan for worsening symptoms mutually developed - community resource information provided - pain assessed - pain management plan developed - symptom review completed  Follow Up Plan: Telephone follow up appointment with care management team member scheduled for: 03-16-2020 at 0945 am  Task: Develop Strategies to Manage Behavior   °Note:   °Care Management Activities:  °  °- action plan for worsening symptoms mutually developed °- community resource information provided °- pain assessed °- pain management plan developed °- symptom review completed  °  ° °  °Care Plan : RNCM: Hypertension (Adult)  °Updates made by Tate, Pamela J since 01/27/2020 12:00 AM  °  °Problem: RNCM: Hypertension (Hypertension)   °Priority: Medium  °  °Goal: RNCM: Hypertension Monitored   °Priority: Medium  °Note:   °Objective:  °• Last practice recorded BP readings:  °BP Readings from Last 3 Encounters:  °01/03/20 (!) 154/84  °11/10/19 (!) 141/78  °10/12/19 132/78  •  ° °• Most recent eGFR/CrCl: No results found for: EGFR  No components found for: CRCL °Current Barriers:  °• Knowledge Deficits related to basic understanding of hypertension pathophysiology and self care management °• Knowledge Deficits related to understanding of medications prescribed for management of hypertension °• Cognitive Deficits °• Limited Social Support °• Unable to independently manage HTN °• Unable to self administer medications as prescribed °• Does not adhere to prescribed medication regimen °• Lacks social connections °• Unable to perform ADLs independently °• Unable to perform IADLs independently °• Does not contact provider office for questions/concerns °Case Manager Clinical Goal(s):  °• Over the next 120 days, patient will verbalize understanding of plan for hypertension management °• Over the next 120 days, patient will demonstrate improved adherence to prescribed treatment plan for hypertension as evidenced by taking all medications as prescribed, monitoring and recording blood pressure as directed, adhering to low sodium/DASH diet °• Over the next 120 days, patient will demonstrate improved health management independence as evidenced by checking blood pressure as directed and notifying PCP if SBP>160 or DBP > 90, taking all medications as prescribe, and adhering to a low sodium diet as discussed. °Interventions:  °• Collaboration with Karamalegos, Alexander J, DO regarding development and update of comprehensive plan of care as evidenced by provider attestation and co-signature °• Inter-disciplinary care team collaboration (see longitudinal plan of care) °• UNABLE to independently:manage HTN °• Evaluation of current treatment plan related to hypertension self management and patient's adherence to plan as established by provider. °• Provided education to patient re: stroke prevention, s/s of heart attack and stroke, DASH diet, complications of uncontrolled blood pressure °• Reviewed  medications with patient and discussed importance of compliance °• Discussed plans with patient for ongoing care management follow up and provided patient with direct contact information for care management team °• Advised patient, providing education and rationale, to monitor blood pressure daily and record, calling PCP for findings outside established parameters.  °Patient Goals/Self-Care Activities °• Over the next 120 days, patient will:  °- Self administers medications as prescribed °Calls provider office for new concerns, questions, or BP outside discussed parameters °Checks BP and records as discussed °Follows a low sodium diet/DASH diet °- blood pressure trends reviewed: 01-20-2020: 158/100 pulse 62, 01-21-2020 132/91, pulse 86; 01-23-2020 145/97, pulse 80.  The patient is also working with the CCM pharmacist to help with management of blood pressure medications  °- depression screen reviewed °- home or ambulatory blood pressure monitoring encouraged °Follow Up Plan: Telephone follow up appointment with care management team member scheduled for: 03-16-2020 at 0945 am °  °Task: RNCM: Identify and Monitor Blood Pressure Elevation   °Note:   °Care Management Activities:  °  °- blood pressure trends reviewed °- depression screen reviewed °- home or ambulatory blood pressure   monitoring encouraged       Care Plan : RNCM: HLD management  Updates made by Vanita Ingles since 01/27/2020 12:00 AM    Problem: RNCM: HLD management   Priority: Medium    Goal: RNCM: HLD Management   Priority: Medium  Note:   Current Barriers:   Poorly controlled hyperlipidemia, complicated by memory loss, dementia, elevated blood pressures   Current antihyperlipidemic regimen: Omega 3 1200 mg daily   Most recent lipid panel:     Component Value Date/Time   CHOL 176 06/10/2018 0800   CHOL 151 04/17/2015 0826   TRIG 98 06/10/2018 0800   TRIG 116 04/17/2015 0826   HDL 35 (L) 06/10/2018 0800   HDL 32 (A) 04/17/2015 0826    CHOLHDL 5.0 (H) 06/10/2018 0800   VLDL 27 05/27/2016 0856   LDLCALC 121 (H) 06/10/2018 0800     ASCVD risk enhancing conditions: age >75, HTN,  former smoker  Unable to independently manage HLD   Lacks social connections  Does not contact provider office for questions/concerns  RN Care Manager Clinical Goal(s):   Over the next 120 days, patient will work with Consulting civil engineer, providers, and care team towards execution of optimized self-health management plan  Over the next 120 days, patient will verbalize understanding of plan for effective management of HLD   Over the next 120 days, patient will work with [employer], CCM team and pcp to address needs related to management of HLD   Interventions:  Collaboration with Parks Ranger, Devonne Doughty, DO regarding development and update of comprehensive plan of care as evidenced by provider attestation and co-signature  Inter-disciplinary care team collaboration (see longitudinal plan of care)  Medication review performed; medication list updated in electronic medical record.   Inter-disciplinary care team collaboration (see longitudinal plan of care)  Referred to pharmacy team for assistance with HLD medication management  Evaluation of current treatment plan related to HLD and patient's adherence to plan as established by provider.  Advised patient to call the office with changes or new questions   Provided education to patient re: heart healthy diet   Discussed plans with patient for ongoing care management follow up and provided patient with direct contact information for care management team   Patient Goals/Self-Care Activities:  Over the next 120 days, patient will:   - call for medicine refill 2 or 3 days before it runs out - call if I am sick and can't take my medicine - keep a list of all the medicines I take; vitamins and herbals too - learn to read medicine labels - use a pillbox to sort medicine - use an alarm clock  or phone to remind me to take my medicine - drink 6 to 8 glasses of water each day - eat 3 to 5 servings of fruits and vegetables each day - eat 5 or 6 small meals each day - limit fast food meals to no more than 1 per week - manage portion size - prepare main meal at home 3 to 5 days each week - read food labels for fat, fiber, carbohydrates and portion size - be open to making changes - I can manage, know and watch for signs of a heart attack - if I have chest pain, call for help - learn about small changes that will make a big difference - learn my personal risk factors - education plan reviewed and/or amended - empathy and reassurance conveyed - family/caregiver participation in learning encouraged - health  literacy screen reviewed - patient's preferred learning methods utilized - privacy ensured - questions encouraged - readiness to learn monitored   Follow Up Plan: Telephone follow up appointment with care management team member scheduled for:03-16-2020 at 0945 am     Task: RNCM: Identify Deficit and Optimize Health Literacy   Note:   Care Management Activities:    - education plan reviewed and/or amended - empathy and reassurance conveyed - family/caregiver participation in learning encouraged - health literacy screen reviewed - patient's preferred learning methods utilized - privacy ensured - questions encouraged - readiness to learn monitored         Plan:Telephone follow up appointment with care management team member scheduled for:  03-16-2020 at [place] am  Noreene Larsson RN, MSN, [place] Mobile: [phone]

## 2020-02-01 ENCOUNTER — Other Ambulatory Visit: Payer: Self-pay | Admitting: Family Medicine

## 2020-02-01 DIAGNOSIS — I10 Essential (primary) hypertension: Secondary | ICD-10-CM

## 2020-02-03 ENCOUNTER — Telehealth: Payer: Self-pay

## 2020-02-03 ENCOUNTER — Telehealth: Payer: Self-pay | Admitting: General Practice

## 2020-02-03 DIAGNOSIS — L02212 Cutaneous abscess of back [any part, except buttock]: Secondary | ICD-10-CM

## 2020-02-03 MED ORDER — AMOXICILLIN-POT CLAVULANATE 875-125 MG PO TABS: 1.0000 | ORAL_TABLET | Freq: Two times a day (BID) | ORAL | 0 refills | Status: DC

## 2020-02-03 NOTE — Telephone Encounter (Signed)
Copied from [place] [phone]. Topic: Quick Communication - See Telephone Encounter >> Feb 03, 2020  9:01 AM Loma [place] wrote: CRM for notification. See Telephone encounter for: 02/03/20. Please FU with [name] pt father who talked to [facility] today about her father coming in for a boil. She has sent pic. Call back at [phone]

## 2020-02-03 NOTE — Telephone Encounter (Signed)
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 I have the picture if you want to see it. She texted me. I told her that she needed to call the office to see if could get an appointment or take to urgent care. It is red, swollen, drainage present. Does look infected.

## 2020-02-03 NOTE — Telephone Encounter (Signed)
I spoke with the patient daughter about an abscess on the back of her father neck redness, drainage and swollen. Concern for signs of infection and requesting an appt to start on antibiotics. 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 I recommended that she take him to Urgent Care for evaluation and treatment. She verbalize understanding.  FYI this patient spoke with [name] today and a picture of this abscess/ boil was sent over.

## 2020-02-03 NOTE — Telephone Encounter (Cosign Needed)
  Chronic Care Management   Note  02/03/2020 Name: Randy [name] MRN: [id number] DOB: 11-04-1934  The patients daughter [name] sent a text message to the [facility] with a picture of a "boil" on the back of the patients neck. The area is red with erythremia spread out from the raised boil area.  White area noted in the center of the raise boil area. Recommended the daughter call the office to see if an appointment was available or take the patient to an urgent care for evaluation and treatment. Per the daughter she was going to call the office.   Follow up plan: Telephone follow up appointment with care management team member scheduled for:  per scheduled outreach.   Noreene Larsson RN, MSN, [place] Mobile: [phone]

## 2020-02-03 NOTE — Telephone Encounter (Signed)
The pt daughter was notified of the providers recommendation. She verbalize understanding, no questions or concerns.

## 2020-02-03 NOTE — Telephone Encounter (Signed)
I have spoken with [name] (daughter) and they said it will be difficult to get him to Urgent Care. I offered one last option - I can send in Augmentin antibiotic and they can use warm compresses at home and he can be seen in person on 02/08/20 by [name] NP here, I have scheduled him for 8am at their request with her, and sent augmentin. He may need switch med or extended course or possible I&D or other option if indicated.  They will f/u sooner or go to urgent care if acute worsening.  [name], [facility] 02/03/2020, 12:09 PM

## 2020-02-03 NOTE — Telephone Encounter (Signed)
I would agree with this plan. Sooner evaluation is likely warranted.  I do not see a picture available.  He can follow up with [name] NP on upcoming Tuesday when she is covering for [place] in the office.  [name], [facility] 02/03/2020, 10:26 AM

## 2020-02-03 NOTE — Addendum Note (Signed)
Addended by: [name] on: 02/03/2020 12:10 PM   Modules accepted: Orders

## 2020-02-08 ENCOUNTER — Other Ambulatory Visit: Payer: Self-pay

## 2020-02-08 ENCOUNTER — Ambulatory Visit: Payer: Medicare Other | Admitting: Unknown Physician Specialty

## 2020-02-08 ENCOUNTER — Encounter: Payer: Self-pay | Admitting: Unknown Physician Specialty

## 2020-02-08 ENCOUNTER — Other Ambulatory Visit: Payer: Self-pay | Admitting: Unknown Physician Specialty

## 2020-02-08 VITALS — BP 154/71 | HR 61 | Temp 97.8°F | Resp 18 | Ht 72.0 in | Wt 228.0 lb

## 2020-02-08 DIAGNOSIS — L0291 Cutaneous abscess, unspecified: Secondary | ICD-10-CM

## 2020-02-08 MED ORDER — MUPIROCIN CALCIUM 2 % EX CREA: 1.0000 "application " | TOPICAL_CREAM | Freq: Two times a day (BID) | CUTANEOUS | 0 refills | Status: DC

## 2020-02-08 MED ORDER — DOXYCYCLINE HYCLATE 100 MG PO TABS: 100.0000 mg | ORAL_TABLET | Freq: Two times a day (BID) | ORAL | 0 refills | Status: DC

## 2020-02-08 NOTE — Progress Notes (Signed)
BP (!) 154/71 (BP Location: Left Arm, Patient Position: Sitting, Cuff Size: Large)   Pulse 61   Temp 97.8 F (36.6 C) (Temporal)   Resp 18   Ht 6' (1.829 m)   Wt 228 lb (103.4 kg)   SpO2 97%   BMI 30.92 kg/m    Subjective:    Patient ID: Randy [name], male    DOB: 1934/06/14, 86 y.o.   MRN: [id number]  HPI: Randy [name] is a 86 y.o. male  Chief Complaint  Patient presents with  . Skin Ulcer    Abscess on the back of the patient neck x 2 weeks. Erythema, drainage and inflamed. Pt currently taking Augmentin and cleaning it with alcohol.   Pt is here with daughter, who gives much of the history, for an abscess on the back of his neck.  Abscess has been there for 2 weeks.  Started on Augmentin 4 days ago without much improvement in the abscess.  Some drainage and cleaning with alcohol.    Relevant past medical, surgical, family and social history reviewed and updated as indicated. Interim medical history since our last visit reviewed. Allergies and medications reviewed and updated.  Review of Systems  Constitutional: Negative.  Negative for fever.  HENT: Negative.   Respiratory: Negative.     Per HPI unless specifically indicated above     Objective:    BP (!) 154/71 (BP Location: Left Arm, Patient Position: Sitting, Cuff Size: Large)   Pulse 61   Temp 97.8 F (36.6 C) (Temporal)   Resp 18   Ht 6' (1.829 m)   Wt 228 lb (103.4 kg)   SpO2 97%   BMI 30.92 kg/m   Wt Readings from Last 3 Encounters:  02/08/20 228 lb (103.4 kg)  01/03/20 225 lb (102.1 kg)  11/10/19 229 lb 9.6 oz (104.1 kg)    Physical Exam Constitutional:      General: He is not in acute distress.    Appearance: Normal appearance. He is well-developed and well-nourished.     Comments: In a wheelchair  HENT:     Head: Normocephalic and atraumatic.  Eyes:     General: Lids are normal. No scleral icterus.       Right eye: No discharge.        Left eye: No discharge.     Conjunctiva/sclera:  Conjunctivae normal.  Neck:     Vascular: No carotid bruit or JVD.  Cardiovascular:     Rate and Rhythm: Normal rate and regular rhythm.     Heart sounds: Normal heart sounds.  Pulmonary:     Effort: Pulmonary effort is normal. No respiratory distress.     Breath sounds: Normal breath sounds.  Abdominal:     Palpations: There is no hepatomegaly or splenomegaly.  Musculoskeletal:        General: Normal range of motion.     Cervical back: Normal range of motion and neck supple.  Skin:    General: Skin is warm, dry and intact.     Coloration: Skin is not pale.     Findings: No rash.     Comments: Abscess in back of neck fluctulent with erythema.  1/2 dollar size  Neurological:     Mental Status: He is alert and oriented to person, place, and time.  Psychiatric:        Mood and Affect: Mood and affect normal.        Behavior: Behavior normal.  Thought Content: Thought content normal.        Judgment: Judgment normal.   After using a betadine and alcohol preparation, area infiltrated with 1% Lidocaine with epinephrine.  Using a #11 blade, lesion lanced and small 1-3 mm incision made.  Expressed a moderate amount of purulent drainage.  Stopped due to intolerance to procedure    Assessment & Plan:   Problem List Items Addressed This Visit   None   Visit Diagnoses    Abscess    -  Primary   Back of neck.  I&D partially successful due to tolerance.  Will change antibiotic from Augmentin to Doxycycline.  Recheck 1 week.        DC Augmentin  Follow up plan: Return in about 1 week (around 02/15/2020).

## 2020-02-08 NOTE — Patient Instructions (Signed)
Warm compressed twicke a day.  Drainage is expected

## 2020-02-08 NOTE — Telephone Encounter (Signed)
   Notes to clinic:  Alternative Requested:ONLY OINTMENT IS COVERED. PLEASE SEND NEW RX.   Requested Prescriptions  Pending Prescriptions Disp Refills   mupirocin ointment (BACTROBAN) 2 % [Pharmacy Med Name: MUPIROCIN 2% OINTMENT]  0      Off-Protocol Failed - 02/08/2020  9:20 AM      Failed - Medication not assigned to a protocol, review manually.      Passed - Valid encounter within last 12 months    Recent Outpatient Visits           Today Abscess   [facility] [name], NP   1 month ago Bilateral lower extremity edema   [place], DO   3 months ago Essential hypertension   [place], DO   4 months ago Patient left without being seen   [place], DO   4 months ago Primary osteoarthritis of right knee   [place], DO       Future Appointments             In 1 week [name], NP [facility], [place]   In 4 months [facility] [place], [facility] Seta, [facility]

## 2020-02-09 ENCOUNTER — Telehealth: Payer: Self-pay

## 2020-02-15 ENCOUNTER — Other Ambulatory Visit: Payer: Self-pay

## 2020-02-15 ENCOUNTER — Ambulatory Visit: Payer: Medicare Other | Admitting: Unknown Physician Specialty

## 2020-02-15 VITALS — BP 156/85 | HR 80 | Wt 228.0 lb

## 2020-02-15 DIAGNOSIS — M170 Bilateral primary osteoarthritis of knee: Secondary | ICD-10-CM

## 2020-02-15 DIAGNOSIS — L0291 Cutaneous abscess, unspecified: Secondary | ICD-10-CM

## 2020-02-15 NOTE — Assessment & Plan Note (Signed)
Bilateral knee indection using Lidocaine and Methylprednisone 40 mg.  Tolerated procedure well.

## 2020-02-15 NOTE — Progress Notes (Signed)
BP (!) 156/85   Pulse 80   Wt 228 lb (103.4 kg)   SpO2 96%   BMI 30.92 kg/m    Subjective:    Patient ID: Randy [name], male    DOB: 1934-10-09, 86 y.o.   MRN: [id number]  HPI: Randy [name] is a 86 y.o. male  Chief Complaint  Patient presents with  . Follow-up    Abscess looking better   Here accompanied by his daughter.    Abscess - Pt here for f/u of neck abscess.  Doing better and less painful.  No fever or pain at this time.    Knee OA - confirmed by x-ray 2019.  Has had injections before in this office and did well over 1 year ago.     Relevant past medical, surgical, family and social history reviewed and updated as indicated. Interim medical history since our last visit reviewed. Allergies and medications reviewed and updated.  Review of Systems  Constitutional: Negative.  Negative for activity change and appetite change.  Respiratory: Negative.   Cardiovascular: Negative.   Psychiatric/Behavioral: Negative.     Per HPI unless specifically indicated above     Objective:    BP (!) 156/85   Pulse 80   Wt 228 lb (103.4 kg)   SpO2 96%   BMI 30.92 kg/m   Wt Readings from Last 3 Encounters:  02/15/20 228 lb (103.4 kg)  02/08/20 228 lb (103.4 kg)  01/03/20 225 lb (102.1 kg)    Physical Exam Constitutional:      General: He is not in acute distress.    Appearance: Normal appearance. He is well-developed and well-nourished.  HENT:     Head: Normocephalic and atraumatic.  Eyes:     General: Lids are normal. No scleral icterus.       Right eye: No discharge.        Left eye: No discharge.     Conjunctiva/sclera: Conjunctivae normal.  Cardiovascular:     Rate and Rhythm: Normal rate.  Pulmonary:     Effort: Pulmonary effort is normal.  Abdominal:     Palpations: There is no hepatomegaly or splenomegaly.  Musculoskeletal:     Right knee: Crepitus present. No swelling, deformity, effusion or erythema. Decreased range of motion. No tenderness.     Left  knee: Crepitus present. No swelling, deformity, effusion or erythema. Decreased range of motion. No tenderness.     Comments: In a wheelchair.    Skin:    General: Skin is intact.     Coloration: Skin is not pale.     Findings: No rash.     Comments: Abscess with decreased erythema.  Expressed more purulent drainage with sac material.  Redressed.    Neurological:     Mental Status: He is alert and oriented to person, place, and time.  Psychiatric:        Mood and Affect: Mood and affect normal.        Behavior: Behavior normal.        Thought Content: Thought content normal.        Judgment: Judgment normal.    STEROID INJECTION  Procedure: Knee Intraarticular Steroid Injection   Description: After verbal consent and patient ed on Area prepped and draped using  semi-sterile technique. Using a anterior  approach, a mixture of 4 cc of  Lidocaine & Solumedrol 40 mg was injected into knee joint.  A bandage was then placed over the injection site. Complications:  none  Post Procedure Instructions: To the ER if any symptoms of erythema or swelling.     Assessment & Plan:   Problem List Items Addressed This Visit      Unprioritized   Bilateral primary osteoarthritis of knee    Bilateral knee indection using Lidocaine and Methylprednisone 40 mg.  Tolerated procedure well.         Other Visit Diagnoses    Abscess    -  Primary   On back of neck much improved.  No drainage one sac material expressed.  Keep covered until healed.  Complete 10 day course of Doxycycline.        Follow up plan: Return if symptoms worsen or fail to improve.

## 2020-02-18 ENCOUNTER — Ambulatory Visit (INDEPENDENT_AMBULATORY_CARE_PROVIDER_SITE_OTHER): Payer: Medicare Other | Admitting: Pharmacist

## 2020-02-18 DIAGNOSIS — I10 Essential (primary) hypertension: Secondary | ICD-10-CM

## 2020-02-18 DIAGNOSIS — R413 Other amnesia: Secondary | ICD-10-CM

## 2020-02-18 DIAGNOSIS — R600 Localized edema: Secondary | ICD-10-CM

## 2020-02-18 NOTE — Chronic Care Management (AMB) (Signed)
Chronic Care Management Pharmacy Note  02/18/2020 Name:  Randy [name] MRN:  [id number] DOB:  June 10, 1934  Subjective: Randy [name] is an 86 y.o. year old male who is a primary patient of [name], DO.  The CCM team was consulted for assistance with disease management and care coordination needs.    Collaboration with patient's daughter/caregiver by telephone for follow up visit in response to provider referral for pharmacy case management and/or care coordination services.   Consent to Services:  The patient was given information about Chronic Care Management services, agreed to services, and gave verbal consent prior to initiation of services.  Please see initial visit note for detailed documentation.   Objective:  Lab Results  Component Value Date   CREATININE 0.83 10/12/2019   CREATININE 0.71 10/11/2019   CREATININE 0.84 10/10/2019    Lab Results  Component Value Date   HGBA1C 5.4 06/10/2018       Component Value Date/Time   CHOL 176 06/10/2018 0800   CHOL 151 04/17/2015 0826   TRIG 98 06/10/2018 0800   TRIG 116 04/17/2015 0826   HDL 35 (L) 06/10/2018 0800   HDL 32 (A) 04/17/2015 0826   CHOLHDL 5.0 (H) 06/10/2018 0800   VLDL 27 05/27/2016 0856   LDLCALC 121 (H) 06/10/2018 0800    BP Readings from Last 3 Encounters:  02/15/20 (!) 156/85  02/08/20 (!) 154/71  01/03/20 (!) 154/84  Per shared record from [facility]: BP 146/89, HR 60 on 02/16/20  Assessment: Review of patient past medical history, allergies, medications, health status, including review of consultants reports, laboratory and other test data, was performed as part of comprehensive evaluation and provision of chronic care management services.   SDOH:  (Social Determinants of Health) assessments and interventions performed: none   CCM Care Plan  Allergies  Allergen Reactions  . Antihistamines, Chlorpheniramine-Type Other (See Comments)    Can't urinate    Medications Reviewed  Today    Reviewed by Vella Raring, [facility] (Pharmacist) on 02/18/20 at [phone]  Med List Status: <None>  Medication Order Taking? Sig Documenting Provider Last Dose Status Informant  carbidopa-levodopa (SINEMET IR) 25-100 MG tablet [id number] Yes Take 1 tablet by mouth 4 (four) times daily -  with meals and at bedtime. [name], MD Taking Active            Med Note [facility], [facility] A   Fri Feb 18, 2020  9:40 AM) Increased to Sinemet (25/100 mg) to 1.5 pills 4 times daily for 1 week. Then increase to 2 pills 4 times a Randy per Neurology  cholecalciferol (VITAMIN D) 1000 units tablet [id number]  Take 1,000 Units by mouth daily. [provider]  Active Other  Coenzyme Q10 (CO Q 10) 100 MG CAPS [id number]  Take 100 mg by mouth daily.  [provider]  Active Other  diclofenac Sodium (VOLTAREN) 1 % GEL [id number]  Apply 2 g topically 4 (four) times daily as needed. To knee arthritis pain [name], DO  Active Pharmacy Records  docusate sodium (COLACE) 100 MG capsule [id number]  Take 1 capsule (100 mg total) by mouth 2 (two) times daily.  Patient not taking: Reported on 02/08/2020   [name], MD  Active   doxycycline (VIBRA-TABS) 100 MG tablet [id number] Yes Take 1 tablet (100 mg total) by mouth 2 (two) times daily. [name], NP Taking Active   enalapril (VASOTEC) 10 MG tablet [id number] Yes Take 1 tablet (10 mg total) by  mouth daily. [name], DO Taking Active   fluticasone (FLONASE) 50 MCG/ACT nasal spray [id number]  Place 2 sprays into both nostrils daily as needed for allergies.  [provider]  Active Other           Med Note [name], [name]   Mon Oct 26, 2018  1:07 PM)    furosemide (LASIX) 20 MG tablet [id number] Yes Take 1 tablet (20 mg total) by mouth every other Randy. [name], DO Taking Active Pharmacy Records           Med Note [facility], [facility]   Fri Dec 17, 2019  9:48 AM) Taking on Mondays,  Wednesdays and Fridays  melatonin 5 MG TABS [id number]  Take 10 mg by mouth at bedtime as needed (sleep). [provider]  Active Other  Multiple Vitamin (MULTIVITAMIN WITH MINERALS) TABS tablet [id number]  Take 1 tablet by mouth daily. [name], MD  Active   mupirocin ointment (BACTROBAN) 2 % [id number]  Apply topically 2 (two) times daily. [name], NP  Active   OLANZapine (ZYPREXA) 5 MG tablet [id number]  TAKE 1 TABLET (5 MG TOTAL) BY MOUTH AT BEDTIME AS NEEDED (AGITATION).  Patient not taking: Reported on 02/08/2020   [name], DO  Active   Omega-3 Fatty Acids (FISH OIL) 1200 MG CAPS [id number]  Take 1,200 mg by mouth 2 (two) times daily.  [provider]  Active Other  omeprazole (PRILOSEC) 20 MG capsule [id number]  Take 20 mg by mouth daily. [provider]  Active Other  rivastigmine (EXELON) 1.5 MG capsule [id number] Yes Take 1 capsule by mouth 2 (two) times daily with a meal. [provider] Taking Active   vitamin C (ASCORBIC ACID) 500 MG tablet [id number]  Take 500 mg by mouth daily. [provider]  Active Other  zinc sulfate 220 (50 Zn) MG capsule [id number]  Take 1 capsule (220 mg total) by mouth daily.  Patient not taking: Reported on 02/08/2020   [name], MD  Active           Patient Active Problem List   Diagnosis Date Noted  . Bilateral primary osteoarthritis of knee 02/15/2020  . Pressure injury of buttock, stage 1   . Delirium   . COVID-19 virus infection 10/08/2019  . Unable to care for self 10/08/2019  . Caregiver unable to cope 10/08/2019  . Recurrent falls 10/08/2019  . Lewy body dementia without behavioral disturbance ([place])   . Prostate cancer ([place]) 06/30/2019  . BPH with urinary obstruction 11/06/2018  . Acute cholecystitis 09/08/2018  . Myalgia due to statin 02/24/2018  . Tricompartment osteoarthritis of left knee 11/14/2017  . Generalized weakness 04/28/2017  . Chronic low back pain  03/12/2017  . Knee pain, chronic 10/01/2016  . Chronic venous insufficiency 08/30/2016  . BPH without obstruction/lower urinary tract symptoms 03/06/2016  . Osteoarthritis of multiple joints 03/06/2016  . Degenerative joint disease (DJD) of lumbar spine 03/06/2016  . Essential hypertension 03/05/2016  . Hyperlipidemia 03/05/2016  . Osteoporosis 03/05/2016  . GERD (gastroesophageal reflux disease) 03/05/2016  . Bilateral lower extremity edema 03/05/2016    Conditions to be addressed/monitored: HTN and memory loss  Care Plan : PharmD - Medication Managment  Updates made by Vella Raring, RPH since 02/18/2020 12:00 AM    Problem: Disease Progression     Long-Range Goal: Disease Progression Prevented or Minimized   Start Date: 01/19/2020  Expected End Date: 04/18/2020  Recent Progress: On track  Priority: High  Note:   Current Barriers:  . Chronic Disease Management support, education, and care coordination needs related to hypertension, osteoarthritis, hyperlipidemia, insomnia and memory loss . Cognitive Deficits  Pharmacist Clinical Goal(s):  [name] Over the next 90 days, caregiver/patient will adhere to plan to optimize therapeutic regimen for hypertension as evidenced by report of adherence to recommended medication management changes through collaboration with PharmD and provider.   Interventions: . 1:1 collaboration with [name], DO regarding development and update of comprehensive plan of care as evidenced by provider attestation and co-signature . Inter-disciplinary care team collaboration (see longitudinal plan of care) . Perform chart review o Patient seen by [name], NP (covering for PCP) on 2/1 and 2/8 for abscess on back of his neck and also knee pain - Patient started on doxycycline 100 mg twice daily for 10 days on 2/1 - Received steroid knee injections on both knees on 2/8 o Patient seen for follow up visit with Neurology on 2/9. Provider  advised patient to:  - Continue rivastigmine 1.5 mg two times a Randy with meals - Increase Sinemet (25/100 mg) to 1.5 pills 4 times daily for 1 week. Then increase to 2 pills 4 times a Randy - Encouraged patient to get COVID-19 vaccine . Today daughter/caregiver reports patient's neck still looks raw, but like healing and patient continuing course of doxycycline, with more Randy to complete . Reports increased patient's Sinemet dose as directed by Neurologist and denies noticing/patient reporting any side effects. [name] Counsel on locations for patient to receive COVID-19 vaccination. Daughter reports will schedule appointment for patient through local pharmacy.  Hypertension . Reports patient taking: o Enalapril 10 mg daily in the morning o Furosemide 20 mg on Mondays, Wednesdays and Fridays . Reports home BP readings from past weeks:  AM PM Notes  23 - January 131/86, HR 71    - - -   3 - February  156/96, HR 62   4 - February 138/98, HR 75 164/109, HR 62   5 - February 143/93, HR 70 146/93, HR 63   6 - February  131/89, HR 70   7 - February  167/116, HR 69   8 - February - -   9 - February  149/92, HR 74   10 - February 144/89, HR 74     . Counsel on blood pressure monitoring technique, including importance of resting prior to readings o Reports patient has difficulty with self-monitoring and is unable to recall to rest prior to taking readings. o Reports believes patient had discomfort from infection and from knees last week. Reports hard to always tell when patient in pain or discomfort - Encourage caregiver to make notation if patient reporting discomfort/pain at time of reading . Counsel on impact of salt/sodium on blood pressure. o Encourage daughter to limit salt and to review nutrition labels for sodium content of foods . Encourage caregiver to continue to monitor home blood pressure and keeping log of results to review during next telephone appointment o Counsel daughter to call  office sooner for reading outside of established parameters or for new or worsening symptoms . Reports swelling in patient's legs similar remains about the same o Reports family continues to encourage patient to elevate feet, but unable to remember to do on his own o Has reported patient unable to use compression stockings due to physical/memory difficulty using tools/strategies that previously worked to getting them on  Medication Adherence . Note patient using weekly pillbox  with slots for four times daily dosing . Reports patient sometimes has difficulty with consistently remembering fourth dose of carbidopa/levodopa, despite assistance from family members . Have discussed with caregiver additional strategies for using medication alarm as adherence aid    Patient Goals/Self-Care Activities . Over the next 90 days, caregiver/patient will:  - take medications as prescribed - check blood pressure, document, and provide at future appointments  Follow Up Plan: Telephone follow up appointment with care management team member scheduled for: 3/9 at 9:15 am      Medication Assistance: None required.  Caregiver affirms current coverage meets needs.  Follow Up:  Caregiver agrees to Care Plan and Follow-up.  [name], PharmD, [place] [phone]

## 2020-02-18 NOTE — Patient Instructions (Signed)
Visit Information  PATIENT GOALS: Goals Addressed            This Visit's Progress   . Pharmacy       Please continue to monitor home blood pressure and keep log of results to share at next appointment Please remember to: -avoid caffeine or exercise 30 minutes before taking readings -rest relaxed in chair for at least 5 minutes prior to taking reading (see "Blood Pressure Measurement Instructions" handout for further reminders)       Caregiver verbalized understanding of instructions, educational materials, and care plan provided today and declined offer to receive copy of patient instructions, educational materials, and care plan.   Telephone follow up appointment with care management team member scheduled for: 3/9 at 9:15 am  [name], PharmD, [place] [phone]

## 2020-02-22 ENCOUNTER — Ambulatory Visit: Payer: Medicare Other | Admitting: Licensed Clinical Social Worker

## 2020-02-22 DIAGNOSIS — I10 Essential (primary) hypertension: Secondary | ICD-10-CM | POA: Diagnosis not present

## 2020-02-22 DIAGNOSIS — M170 Bilateral primary osteoarthritis of knee: Secondary | ICD-10-CM

## 2020-02-22 DIAGNOSIS — F0280 Dementia in other diseases classified elsewhere without behavioral disturbance: Secondary | ICD-10-CM | POA: Diagnosis not present

## 2020-02-22 DIAGNOSIS — G3183 Dementia with Lewy bodies: Secondary | ICD-10-CM

## 2020-02-22 DIAGNOSIS — R413 Other amnesia: Secondary | ICD-10-CM

## 2020-02-22 NOTE — Chronic Care Management (AMB) (Signed)
Chronic Care Management    Clinical Social Work Note  02/22/2020 Name: Randy [name] MRN: [id number] DOB: 1934/08/01  Randy [name] is a 86 y.o. year old male who is a primary care patient of [name], DO. The CCM team was consulted to assist the patient with chronic disease management and/or care coordination needs related to: Mental Health Counseling and Resources.   Engaged with patient by telephone for follow up visit in response to provider referral for social work chronic care management and care coordination services.   Consent to Services:  The patient was given the following information about Chronic Care Management services today, agreed to services, and gave verbal consent: 1. CCM service includes personalized support from designated clinical staff supervised by the primary care provider, including individualized plan of care and coordination with other care providers 2. 24/7 contact phone numbers for assistance for urgent and routine care needs. 3. Service will only be billed when office clinical staff spend 20 minutes or more in a month to coordinate care. 4. Only one practitioner may furnish and bill the service in a calendar month. 5.The patient may stop CCM services at any time (effective at the end of the month) by phone call to the office staff. 6. The patient will be responsible for cost sharing (co-pay) of up to 20% of the service fee (after annual deductible is met). Patient agreed to services and consent obtained.  Patient agreed to services and consent obtained.   Assessment: Review of patient past medical history, allergies, medications, and health status, including review of relevant consultants reports was performed today as part of a comprehensive evaluation and provision of chronic care management and care coordination services.     SDOH (Social Determinants of Health) assessments and interventions performed:    Advanced Directives Status: See Care Plan  for related entries.  CCM Care Plan  Allergies  Allergen Reactions  . Antihistamines, Chlorpheniramine-Type Other (See Comments)    Can't urinate    Outpatient Encounter Medications as of 02/22/2020  Medication Sig Note  . carbidopa-levodopa (SINEMET IR) 25-100 MG tablet Take 1 tablet by mouth 4 (four) times daily -  with meals and at bedtime. 02/18/2020: Increased to Sinemet (25/100 mg) to 1.5 pills 4 times daily for 1 week. Then increase to 2 pills 4 times a day per Neurology  . cholecalciferol (VITAMIN D) 1000 units tablet Take 1,000 Units by mouth daily.   . Coenzyme Q10 (CO Q 10) 100 MG CAPS Take 100 mg by mouth daily.    . diclofenac Sodium (VOLTAREN) 1 % GEL Apply 2 g topically 4 (four) times daily as needed. To knee arthritis pain   . docusate sodium (COLACE) 100 MG capsule Take 1 capsule (100 mg total) by mouth 2 (two) times daily. (Patient not taking: Reported on 02/08/2020)   . doxycycline (VIBRA-TABS) 100 MG tablet Take 1 tablet (100 mg total) by mouth 2 (two) times daily.   . enalapril (VASOTEC) 10 MG tablet Take 1 tablet (10 mg total) by mouth daily.   . fluticasone (FLONASE) 50 MCG/ACT nasal spray Place 2 sprays into both nostrils daily as needed for allergies.    . furosemide (LASIX) 20 MG tablet Take 1 tablet (20 mg total) by mouth every other day. 12/17/2019: Taking on Mondays, Wednesdays and Fridays  . melatonin 5 MG TABS Take 10 mg by mouth at bedtime as needed (sleep).   . Multiple Vitamin (MULTIVITAMIN WITH MINERALS) TABS tablet Take 1 tablet by mouth  daily.   . mupirocin ointment (BACTROBAN) 2 % Apply topically 2 (two) times daily.   [name] OLANZapine (ZYPREXA) 5 MG tablet TAKE 1 TABLET (5 MG TOTAL) BY MOUTH AT BEDTIME AS NEEDED (AGITATION). (Patient not taking: Reported on 02/08/2020)   . Omega-3 Fatty Acids (FISH OIL) 1200 MG CAPS Take 1,200 mg by mouth 2 (two) times daily.    [name] omeprazole (PRILOSEC) 20 MG capsule Take 20 mg by mouth daily.   . rivastigmine (EXELON) 1.5 MG  capsule Take 1 capsule by mouth 2 (two) times daily with a meal.   . vitamin C (ASCORBIC ACID) 500 MG tablet Take 500 mg by mouth daily.   [name] zinc sulfate 220 (50 Zn) MG capsule Take 1 capsule (220 mg total) by mouth daily. (Patient not taking: Reported on 02/08/2020)    No facility-administered encounter medications on file as of 02/22/2020.    Patient Active Problem List   Diagnosis Date Noted  . Bilateral primary osteoarthritis of knee 02/15/2020  . Pressure injury of buttock, stage 1   . Delirium   . COVID-19 virus infection 10/08/2019  . Unable to care for self 10/08/2019  . Caregiver unable to cope 10/08/2019  . Recurrent falls 10/08/2019  . Lewy body dementia without behavioral disturbance ([place])   . Prostate cancer ([place]) 06/30/2019  . BPH with urinary obstruction 11/06/2018  . Acute cholecystitis 09/08/2018  . Myalgia due to statin 02/24/2018  . Tricompartment osteoarthritis of left knee 11/14/2017  . Generalized weakness 04/28/2017  . Chronic low back pain 03/12/2017  . Knee pain, chronic 10/01/2016  . Chronic venous insufficiency 08/30/2016  . BPH without obstruction/lower urinary tract symptoms 03/06/2016  . Osteoarthritis of multiple joints 03/06/2016  . Degenerative joint disease (DJD) of lumbar spine 03/06/2016  . Essential hypertension 03/05/2016  . Hyperlipidemia 03/05/2016  . Osteoporosis 03/05/2016  . GERD (gastroesophageal reflux disease) 03/05/2016  . Bilateral lower extremity edema 03/05/2016    Conditions to be addressed/monitored: Dementia; Mental Health Concerns   Care Plan : General Social Work (Adult)  Updates made by Greg Cutter, LCSW since 02/22/2020 12:00 AM    Problem: Caregiver Stress     Long-Range Goal: Caregiver Coping Optimized for Daughter   Start Date: 02/22/2020  Priority: Medium  Note:   Evidence-based guidance:   Recognize and validate the complex nature of dementia, as well as the impact on the caregiver and extended family;  provide education and support to align with needs and stage of dementia.   Acknowledge feelings of grief associated with dementia such as loss of relationship, recreational activities, future with patient and loss associated with out-of-home placement.   Encourage verbalization of feelings without ruminating.   Discuss services that may help balance the caregiving role and living the life he/she chooses such as professionally or peer-led support groups, web-based support, counseling and services such as respite care or in-home help.   Refer for or provide psychoeducation activities such as cognitive reframing to improve family/caregiver quality of life, wellbeing, confidence, perception of burden, mental health and self-efficacy.   Encourage use of individualized coping strategies that may include yoga, spiritual support, distraction, exercise, relaxation, music, massage, music therapy and outdoor activities to utilize nature's restorative properties.   Suggest home-monitoring system that may reduce family/caregiver worry and improve sleep.   Provide proactive acknowledgement of potential for depressive symptoms to appear; normalize response to burden of caregiving; refer to primary care provider or for mental health services.     Timeframe:  [employer]  Goal Priority:  Medium  Start Date:  02/22/20                         Expected End Date:  05/21/20                    Follow Up Date- 04/18/20  Current Barriers:  . Financial constraints . Level of care concerns . ADL IADL limitations . Social Isolation . Limited access to caregiver . Memory Deficits . Lacks knowledge of community resource: personal care service resources within the area that may benefit patient . Lack of socialization   Clinical Social Work Clinical Goal(s):  [name] Over the next 90 days, client will work with SW to address concerns related to lack of care/support within the home  Interventions: . Patient interviewed  and appropriate assessments performed . LCSW spoke wit patient's daughter on 02/22/20 as LCSW was unable to reach patient. Patient is stable per daughter but is anxious about his mobility and is eager to gain a ramp through [facility]. Patient is on the wait list for another 2 months to get ramp installed. CCM LCSW provided family with [facility] [employer] information and their contact information in the case that they can set up his ramp sooner.  . Provided patient and family with information about personal care service resources within the area. Patient reports that he lives with his spouse and daughter and that his daughter and spouse are his main sources of support that assist him with transportation/meal prep/daily needs.  . Discussed plans with patient for ongoing care management follow up and provided patient with direct contact information for care management team . Advised patient to contact CCM team with any urgent concerns . Assisted patient/caregiver with obtaining information about health plan benefits . Provided education and assistance to client regarding Advanced Directives. . Provided education to patient/caregiver regarding level of care options. [name] LCSW provided self-care education. Patient has ongoing issue with memory and sleep. Patient was encouraged to take medications as prescribed and to ask for help (daughter or spouse) when needed and to try to check blood pressure and record it once a day per PCP's recommendation. Family continue to encourage patient to keep up with blood pressure recordings.  . Patient has been having pain and discomfort in his right knee which has affected his mobility. PT has restarted. Patient has been wearing his compression stockings. . Daughter/patient reports pt is still taking melatonin dose of 51m per night but still struggles with insomnia. . Daughter/patient declined needing MOW's at this time.  . Patient admits to feeling lonely at times due to  isolation related to COVID-19. LCSW provided family with community resource education for socialization ([place] [facility] wish for LCSW to mail this information to their address. LCSW sent out resources on 07/16/19. Emotional support and coping skill education provided as well. Patient was very receptive to this information. UPDATE- [facility] [place] [facility] [place] They are expecting to open up again in October and patient is agreeable to attend center in order to gain socialization.  . Patient is not interested in gaining mental health support at this time so CCM LCSW strongly encouraged family to consider patient going to a local senior center.  . Daughter reports ongoing swelling in patient's legs and feet. Patient is not taking his lasix as he  has had a difficult time with making it to the bathroom in time. LCSW encouraged daughter to ensure that patient elevates his legs and wears his compression stockings.  [name] LCSW completed referral for [facility] to assist with ramp resources, Meals on Wheels and to place patient on the wait list for [facility] through [facility]. Family report that they are unable to pay out of pocket for an aide but that he needs this level of support. Family wish to gain a ramp (for both patient and spouse) at the front of their home in order to prevent future falls.  - Patient Self Care Activities:  . Attends all scheduled provider appointments . Calls provider office for new concerns or questions       Follow Up Plan: SW will follow up with patient by phone over the next quarter      Eula Fried, [place], MSW, [email or web address] Phone: [phone]

## 2020-03-01 ENCOUNTER — Telehealth: Payer: Self-pay | Admitting: *Deleted

## 2020-03-01 NOTE — Chronic Care Management (AMB) (Signed)
  Care Management   Note  03/01/2020 Name: Randy [name] MRN: [id number] DOB: 1934-11-08  Randy [name] is a 86 y.o. year old male who is a primary care patient of [name], DO and is actively engaged with the care management team. I reached out to Ernesto Rutherford by phone today to assist with re-scheduling a follow up visit with the Pharmacist  Follow up plan: Telephone appointment with care management team member scheduled for 03/08/2020   [place] [facility]

## 2020-03-08 ENCOUNTER — Ambulatory Visit: Payer: Medicare Other | Admitting: Pharmacist

## 2020-03-08 DIAGNOSIS — I10 Essential (primary) hypertension: Secondary | ICD-10-CM

## 2020-03-08 NOTE — Patient Instructions (Signed)
Visit Information  PATIENT GOALS: Goals Addressed            This Visit's Progress   . Pharmacy Goals       Please continue to monitor home blood pressure and keep log of results to share at next appointment  Please remember to: -avoid caffeine or exercise 30 minutes before taking readings -rest relaxed in chair for at least 5 minutes prior to taking reading (see "Blood Pressure Measurement Instructions" handout for further reminders)  Feel free to call me with any questions or concerns. I look forward to our next call!  [name], PharmD, [place] [phone]       The patient verbalized understanding of instructions, educational materials, and care plan provided today and declined offer to receive copy of patient instructions, educational materials, and care plan.   Telephone follow up appointment with care management team member scheduled for: 03/17/2020 at 9:45 AM  [name], PharmD, [place] [phone]

## 2020-03-08 NOTE — Chronic Care Management (AMB) (Signed)
Chronic Care Management Pharmacy Note  03/08/2020 Name:  Randy [name] MRN:  [id number] DOB:  1934/06/28  Subjective: Randy [name] is an 86 y.o. year old male who is a primary patient of [name], DO.  The CCM team was consulted for assistance with disease management and care coordination needs.    Collaboration with patient's daughter/caregiver by telephone for follow up visit in response to provider referral for pharmacy case management and/or care coordination services. .   Consent to Services:  The patient was given information about Chronic Care Management services, agreed to services, and gave verbal consent prior to initiation of services.  Please see initial visit note for detailed documentation.   Assessment: Review of patient past medical history, allergies, medications, health status, including review of consultants reports, laboratory and other test data, was performed as part of comprehensive evaluation and provision of chronic care management services.   SDOH:  (Social Determinants of Health) assessments and interventions performed: none   CCM Care Plan  Allergies  Allergen Reactions  . Antihistamines, Chlorpheniramine-Type Other (See Comments)    Can't urinate    Medications Reviewed Today    Reviewed by Vella Raring, [facility] (Pharmacist) on 02/18/20 at [phone]  Med List Status: <None>  Medication Order Taking? Sig Documenting Provider Last Dose Status Informant  carbidopa-levodopa (SINEMET IR) 25-100 MG tablet [id number] Yes Take 1 tablet by mouth 4 (four) times daily -  with meals and at bedtime. [name], MD Taking Active            Med Note [facility], [facility] A   Fri Feb 18, 2020  9:40 AM) Increased to Sinemet (25/100 mg) to 1.5 pills 4 times daily for 1 week. Then increase to 2 pills 4 times a day per Neurology  cholecalciferol (VITAMIN D) 1000 units tablet [id number]  Take 1,000 Units by mouth daily. [provider]  Active Other   Coenzyme Q10 (CO Q 10) 100 MG CAPS [id number]  Take 100 mg by mouth daily.  [provider]  Active Other  diclofenac Sodium (VOLTAREN) 1 % GEL [id number]  Apply 2 g topically 4 (four) times daily as needed. To knee arthritis pain [name], DO  Active Pharmacy Records  docusate sodium (COLACE) 100 MG capsule [id number]  Take 1 capsule (100 mg total) by mouth 2 (two) times daily.  Patient not taking: Reported on 02/08/2020   [name], MD  Active   doxycycline (VIBRA-TABS) 100 MG tablet [id number] Yes Take 1 tablet (100 mg total) by mouth 2 (two) times daily. [name], NP Taking Active   enalapril (VASOTEC) 10 MG tablet [id number] Yes Take 1 tablet (10 mg total) by mouth daily. [name], DO Taking Active   fluticasone (FLONASE) 50 MCG/ACT nasal spray [id number]  Place 2 sprays into both nostrils daily as needed for allergies.  [provider]  Active Other           Med Note [name], [name]   Mon Oct 26, 2018  1:07 PM)    furosemide (LASIX) 20 MG tablet [id number] Yes Take 1 tablet (20 mg total) by mouth every other day. [name], DO Taking Active Pharmacy Records           Med Note [facility], [facility] A   Fri Dec 17, 2019  9:48 AM) Taking on Mondays, Wednesdays and Fridays  melatonin 5 MG TABS [id number]  Take 10 mg by mouth at bedtime as needed (sleep). [provider]  Active Other  Multiple Vitamin (MULTIVITAMIN WITH MINERALS) TABS tablet [id number]  Take 1 tablet by mouth daily. [name], MD  Active   mupirocin ointment (BACTROBAN) 2 % [id number]  Apply topically 2 (two) times daily. [name], NP  Active   OLANZapine (ZYPREXA) 5 MG tablet [id number]  TAKE 1 TABLET (5 MG TOTAL) BY MOUTH AT BEDTIME AS NEEDED (AGITATION).  Patient not taking: Reported on 02/08/2020   [name], DO  Active   Omega-3 Fatty Acids (FISH OIL) 1200 MG CAPS [id number]  Take 1,200 mg by mouth 2 (two) times daily.   [provider]  Active Other  omeprazole (PRILOSEC) 20 MG capsule [id number]  Take 20 mg by mouth daily. [provider]  Active Other  rivastigmine (EXELON) 1.5 MG capsule [id number] Yes Take 1 capsule by mouth 2 (two) times daily with a meal. [provider] Taking Active   vitamin C (ASCORBIC ACID) 500 MG tablet [id number]  Take 500 mg by mouth daily. [provider]  Active Other  zinc sulfate 220 (50 Zn) MG capsule [id number]  Take 1 capsule (220 mg total) by mouth daily.  Patient not taking: Reported on 02/08/2020   [name], MD  Active           Patient Active Problem List   Diagnosis Date Noted  . Bilateral primary osteoarthritis of knee 02/15/2020  . Pressure injury of buttock, stage 1   . Delirium   . COVID-19 virus infection 10/08/2019  . Unable to care for self 10/08/2019  . Caregiver unable to cope 10/08/2019  . Recurrent falls 10/08/2019  . Lewy body dementia without behavioral disturbance ([place])   . Prostate cancer ([place]) 06/30/2019  . BPH with urinary obstruction 11/06/2018  . Acute cholecystitis 09/08/2018  . Myalgia due to statin 02/24/2018  . Tricompartment osteoarthritis of left knee 11/14/2017  . Generalized weakness 04/28/2017  . Chronic low back pain 03/12/2017  . Knee pain, chronic 10/01/2016  . Chronic venous insufficiency 08/30/2016  . BPH without obstruction/lower urinary tract symptoms 03/06/2016  . Osteoarthritis of multiple joints 03/06/2016  . Degenerative joint disease (DJD) of lumbar spine 03/06/2016  . Essential hypertension 03/05/2016  . Hyperlipidemia 03/05/2016  . Osteoporosis 03/05/2016  . GERD (gastroesophageal reflux disease) 03/05/2016  . Bilateral lower extremity edema 03/05/2016    Conditions to be addressed/monitored: HTN and memory loss  Care Plan : PharmD - Medication Managment  Updates made by Vella Raring, RPH since 03/08/2020 12:00 AM    Problem: Disease Progression      Long-Range Goal: Disease Progression Prevented or Minimized   Start Date: 01/19/2020  Expected End Date: 04/18/2020  Recent Progress: On track  Priority: High  Note:   Current Barriers:  . Chronic Disease Management support, education, and care coordination needs related to hypertension, osteoarthritis, hyperlipidemia, insomnia and memory loss . Cognitive Deficits  Pharmacist Clinical Goal(s):  [name] Over the next 90 days, caregiver/patient will adhere to plan to optimize therapeutic regimen for hypertension as evidenced by report of adherence to recommended medication management changes through collaboration with PharmD and provider.   Interventions: . 1:1 collaboration with [name], DO regarding development and update of comprehensive plan of care as evidenced by provider attestation and co-signature . Inter-disciplinary care team collaboration (see longitudinal plan of care) . Daughter reports that she recently had surgery and is recovering. o Unable to review BP readings today. Reschedule time to review these with caregiver o Denies any medication questions/concerns  on behalf of patient today  Patient Goals/Self-Care Activities . Over the next 90 days, caregiver/patient will:  - take medications as prescribed - check blood pressure, document, and provide at future appointments  Follow Up Plan: Telephone follow up appointment with care management team member scheduled for: 03/17/2020 9:45 AM      Follow Up:  Caregiver agrees to Care Plan and Follow-up.  [name], PharmD, [place] [phone]

## 2020-03-13 ENCOUNTER — Telehealth: Payer: Self-pay | Admitting: Family Medicine

## 2020-03-13 NOTE — Telephone Encounter (Signed)
Copied from [place] [phone]. Topic: Medicare AWV >> Mar 13, 2020 11:17 AM Cher Nakai R wrote: Reason for CRM:   No answer unable to leave a message for patient to call back and schedule the Medicare Annual Wellness Visit (AWV) virtually or by telephone.  Last AWV 03/02/2019  Please schedule at anytime with [facility].  40 minute appointment  Any questions, please call me at [phone]

## 2020-03-15 ENCOUNTER — Telehealth: Payer: Self-pay

## 2020-03-16 ENCOUNTER — Telehealth: Payer: Self-pay | Admitting: General Practice

## 2020-03-16 ENCOUNTER — Ambulatory Visit (INDEPENDENT_AMBULATORY_CARE_PROVIDER_SITE_OTHER): Payer: Medicare Other | Admitting: General Practice

## 2020-03-16 DIAGNOSIS — I10 Essential (primary) hypertension: Secondary | ICD-10-CM

## 2020-03-16 DIAGNOSIS — F0280 Dementia in other diseases classified elsewhere without behavioral disturbance: Secondary | ICD-10-CM

## 2020-03-16 DIAGNOSIS — R413 Other amnesia: Secondary | ICD-10-CM

## 2020-03-16 DIAGNOSIS — E782 Mixed hyperlipidemia: Secondary | ICD-10-CM

## 2020-03-16 NOTE — Chronic Care Management (AMB) (Signed)
Chronic Care Management   CCM RN Visit Note  03/16/2020 Name: Randy [name] MRN: [id number] DOB: 23-Sep-1934  Subjective: Randy [name] is a 86 y.o. year old male who is a primary care patient of [name], DO. The care management team was consulted for assistance with disease management and care coordination needs.    Engaged with patient by telephone for follow up visit in response to provider referral for case management and/or care coordination services. Spoke to the patients daughter [name]  Consent to Services:  The patient was given information about Chronic Care Management services, agreed to services, and gave verbal consent prior to initiation of services.  Please see initial visit note for detailed documentation.   Patient agreed to services and verbal consent obtained.   Assessment: Review of patient past medical history, allergies, medications, health status, including review of consultants reports, laboratory and other test data, was performed as part of comprehensive evaluation and provision of chronic care management services.   SDOH (Social Determinants of Health) assessments and interventions performed:    CCM Care Plan  Allergies  Allergen Reactions  . Antihistamines, Chlorpheniramine-Type Other (See Comments)    Can't urinate    Outpatient Encounter Medications as of 03/16/2020  Medication Sig Note  . carbidopa-levodopa (SINEMET IR) 25-100 MG tablet Take 1 tablet by mouth 4 (four) times daily -  with meals and at bedtime. 02/18/2020: Increased to Sinemet (25/100 mg) to 1.5 pills 4 times daily for 1 week. Then increase to 2 pills 4 times a day per Neurology  . cholecalciferol (VITAMIN D) 1000 units tablet Take 1,000 Units by mouth daily.   . Coenzyme Q10 (CO Q 10) 100 MG CAPS Take 100 mg by mouth daily.    . diclofenac Sodium (VOLTAREN) 1 % GEL Apply 2 g topically 4 (four) times daily as needed. To knee arthritis pain   . docusate sodium (COLACE)  100 MG capsule Take 1 capsule (100 mg total) by mouth 2 (two) times daily. (Patient not taking: Reported on 02/08/2020)   . doxycycline (VIBRA-TABS) 100 MG tablet Take 1 tablet (100 mg total) by mouth 2 (two) times daily.   . enalapril (VASOTEC) 10 MG tablet Take 1 tablet (10 mg total) by mouth daily.   . fluticasone (FLONASE) 50 MCG/ACT nasal spray Place 2 sprays into both nostrils daily as needed for allergies.    . furosemide (LASIX) 20 MG tablet Take 1 tablet (20 mg total) by mouth every other day. 12/17/2019: Taking on Mondays, Wednesdays and Fridays  . melatonin 5 MG TABS Take 10 mg by mouth at bedtime as needed (sleep).   . Multiple Vitamin (MULTIVITAMIN WITH MINERALS) TABS tablet Take 1 tablet by mouth daily.   . mupirocin ointment (BACTROBAN) 2 % Apply topically 2 (two) times daily.   [name] OLANZapine (ZYPREXA) 5 MG tablet TAKE 1 TABLET (5 MG TOTAL) BY MOUTH AT BEDTIME AS NEEDED (AGITATION). (Patient not taking: Reported on 02/08/2020)   . Omega-3 Fatty Acids (FISH OIL) 1200 MG CAPS Take 1,200 mg by mouth 2 (two) times daily.    [name] omeprazole (PRILOSEC) 20 MG capsule Take 20 mg by mouth daily.   . rivastigmine (EXELON) 1.5 MG capsule Take 1 capsule by mouth 2 (two) times daily with a meal.   . vitamin C (ASCORBIC ACID) 500 MG tablet Take 500 mg by mouth daily.   [name] zinc sulfate 220 (50 Zn) MG capsule Take 1 capsule (220 mg total) by mouth daily. (Patient not  taking: Reported on 02/08/2020)    No facility-administered encounter medications on file as of 03/16/2020.    Patient Active Problem List   Diagnosis Date Noted  . Bilateral primary osteoarthritis of knee 02/15/2020  . Pressure injury of buttock, stage 1   . Delirium   . COVID-19 virus infection 10/08/2019  . Unable to care for self 10/08/2019  . Caregiver unable to cope 10/08/2019  . Recurrent falls 10/08/2019  . Lewy body dementia without behavioral disturbance ([place])   . Prostate cancer ([place]) 06/30/2019  . BPH with urinary obstruction  11/06/2018  . Acute cholecystitis 09/08/2018  . Myalgia due to statin 02/24/2018  . Tricompartment osteoarthritis of left knee 11/14/2017  . Generalized weakness 04/28/2017  . Chronic low back pain 03/12/2017  . Knee pain, chronic 10/01/2016  . Chronic venous insufficiency 08/30/2016  . BPH without obstruction/lower urinary tract symptoms 03/06/2016  . Osteoarthritis of multiple joints 03/06/2016  . Degenerative joint disease (DJD) of lumbar spine 03/06/2016  . Essential hypertension 03/05/2016  . Hyperlipidemia 03/05/2016  . Osteoporosis 03/05/2016  . GERD (gastroesophageal reflux disease) 03/05/2016  . Bilateral lower extremity edema 03/05/2016    Conditions to be addressed/monitored:HTN, HLD, Dementia and Memory loss  Care Plan : RNCM: Dementia (Adult)  Updates made by Vanita Ingles since 03/16/2020 12:00 AM    Problem: RNCM: Management of Dementia and Memory Loss   Priority: Medium    Long-Range Goal: RNCM: Management of Dementia and Memory Loss   Priority: Medium  Note:   Current Barriers:  [name] Knowledge Deficits related to resources available for patient with dementia and memory loss  . Lacks caregiver support.  . Cognitive Deficits . Unable to independently manage changes related to dementia and memory loss  . Unable to self administer medications as prescribed . Does not adhere to prescribed medication regimen . Lacks social connections . Unable to perform ADLs independently . Unable to perform IADLs independently . Does not contact provider office for questions/concerns  Nurse Case Manager Clinical Goal(s):  [name] Over the next 120 days, patient will verbalize understanding of plan for effective management of dementia and memory loss  . Over the next 120 days, patient will attend all scheduled medical appointments: no upcoming appointments but the patients daughter knows to call for changes or needs  . Over the next 120 days, patient will demonstrate improved adherence  to prescribed treatment plan for dementia and memory loss as evidenced bypatient being as independent as possible and working with CCM team to optimize health and well being related to dementia and memory loss.   Interventions:  . 1:1 collaboration with [name], DO regarding development and update of comprehensive plan of care as evidenced by provider attestation and co-signature . Inter-disciplinary care team collaboration (see longitudinal plan of care) . Evaluation of current treatment plan related to dementia and memoryt loss  and patient's adherence to plan as established by provider. 03-16-2020: The patients is about the same. The patients daughter states other than a little  complaint of dizziness the patient is doing well. Denies any safety concerns.  . Advised patient to call the office for changes in condition or questions  . Provided education to patient re: caregiver burn out and support available. 03-16-2020: The patients daughter is having a procedure this afternoon but denies any new concerns related to caregiver concerns  . Discussed plans with patient for ongoing care management follow up and provided patient with direct contact information for care management team .  Provided patient with dementia and memory loss  educational materials related to changes in memory. The daughter states that the patient seems to be in a "better mood" right now. 03-16-2020: Denies any changes in memory at this time. About the same.   Patient Goals/Self-Care Activities Over the next 120 days, patient will:  - Patient will self administer medications as prescribed Patient will attend all scheduled provider appointments Patient will call pharmacy for medication refills Patient will attend church or other social activities Patient will continue to perform ADL's independently Patient will call provider office for new concerns or questions Patient will work with BSW to address care  coordination needs and will continue to work with the clinical team to address health care and disease management related needs.   - action plan for worsening symptoms mutually developed - community resource information provided - pain assessed - pain management plan developed - symptom review completed  Follow Up Plan: Telephone follow up appointment with care management team member scheduled for: 05-25-2020 at 80 am       Care Plan : RNCM: Hypertension (Adult)  Updates made by Vanita Ingles since 03/16/2020 12:00 AM    Problem: RNCM: Hypertension (Hypertension)   Priority: Medium    Goal: RNCM: Hypertension Monitored   Priority: Medium  Note:   Objective:  . Last practice recorded BP readings:  . BP Readings from Last 3 Encounters: .  02/15/20 . (!) 156/85 .  02/08/20 . (!) 154/71 .  01/03/20 . (!) 154/84 .    [name] Most recent eGFR/CrCl: No results found for: EGFR  No components found for: CRCL Current Barriers:  [name] Knowledge Deficits related to basic understanding of hypertension pathophysiology and self care management . Knowledge Deficits related to understanding of medications prescribed for management of hypertension . Cognitive Deficits . Limited Social Support . Unable to independently manage HTN . Unable to self administer medications as prescribed . Does not adhere to prescribed medication regimen . Lacks social connections . Unable to perform ADLs independently . Unable to perform IADLs independently . Does not contact provider office for questions/concerns Case Manager Clinical Goal(s):  [name] Over the next 120 days, patient will verbalize understanding of plan for hypertension management . Over the next 120 days, patient will demonstrate improved adherence to prescribed treatment plan for hypertension as evidenced by taking all medications as prescribed, monitoring and recording blood pressure as directed, adhering to low sodium/DASH diet . Over the next 120 days,  patient will demonstrate improved health management independence as evidenced by checking blood pressure as directed and notifying PCP if SBP>160 or DBP > 90, taking all medications as prescribe, and adhering to a low sodium diet as discussed. Interventions:  . Collaboration with [name], DO regarding development and update of comprehensive plan of care as evidenced by provider attestation and co-signature . Inter-disciplinary care team collaboration (see longitudinal plan of care) . UNABLE to independently:manage HTN . Evaluation of current treatment plan related to hypertension self management and patient's adherence to plan as established by provider. 03-16-2020: The patients daughter is working closely with the pharm D and RNCM to manage blood pressure. The daughter states the patient still has edema and swelling but it is not worse than usual. Will continue to monitor.  . Provided education to patient re: stroke prevention, s/s of heart attack and stroke, DASH diet, complications of uncontrolled blood pressure . Reviewed medications with patient and discussed importance of compliance. 03-16-2020: The patient is compliant with  medications regimen, also working with the pharm D on a regular basis. Denies any concerns with medication management or cost.  . Discussed plans with patient for ongoing care management follow up and provided patient with direct contact information for care management team . Advised patient, providing education and rationale, to monitor blood pressure daily and record, calling PCP for findings outside established parameters.  Patient Goals/Self-Care Activities . Over the next 120 days, patient will:  - Self administers medications as prescribed Calls provider office for new concerns, questions, or BP outside discussed parameters Checks BP and records as discussed Follows a low sodium diet/DASH diet - blood pressure trends reviewed: 01-20-2020: 158/100 pulse  62, 01-21-2020 132/91, pulse 86; 01-23-2020 145/97, pulse 80.  The patient is also working with the CCM pharmacist to help with management of blood pressure medications  - depression screen reviewed - home or ambulatory blood pressure monitoring encouraged Follow Up Plan: Telephone follow up appointment with care management team member scheduled for: 05-25-2020 at 19 am   Care Plan : RNCM: HLD management  Updates made by Vanita Ingles since 03/16/2020 12:00 AM    Problem: RNCM: HLD management   Priority: Medium    Long-Range Goal: RNCM: HLD Management   Priority: Medium  Note:   Current Barriers:  . Poorly controlled hyperlipidemia, complicated by memory loss, dementia, elevated blood pressures  . Current antihyperlipidemic regimen: Omega 3 1200 mg daily  . Most recent lipid panel:  . Lab Results .  Component . Value . Date .   [name] CHOL . 176 . 06/10/2018 .   [name] HDL . 35 (L) . 06/10/2018 .   [name] [place] . 121 (H) . 06/10/2018 .   [name] TRIG . 98 . 06/10/2018 .   [name] CHOLHDL . 5.0 (H) . 06/10/2018 .    [name] ASCVD risk enhancing conditions: age >79, HTN,  former smoker . Unable to independently manage HLD  . Lacks social connections . Does not contact provider office for questions/concerns  RN Care Manager Clinical Goal(s):  [name] Over the next 120 days, patient will work with Consulting civil engineer, providers, and care team towards execution of optimized self-health management plan . Over the next 120 days, patient will verbalize understanding of plan for effective management of HLD  . Over the next 120 days, patient will work with [facility] [place] team and pcp to address needs related to management of HLD   Interventions: . Collaboration with [name], DO regarding development and update of comprehensive plan of care as evidenced by provider attestation and co-signature . Inter-disciplinary care team collaboration (see longitudinal plan of care) . Medication review performed; medication list  updated in electronic medical record.  Bertram Savin care team collaboration (see longitudinal plan of care) . Referred to pharmacy team for assistance with HLD medication management . Evaluation of current treatment plan related to HLD and patient's adherence to plan as established by provider. . Advised patient to call the office with changes or new questions  . Provided education to patient re: heart healthy diet. 03-16-2020: The patient is eating well and the patients daughter tries to make sure he watches sodium and fats. . Discussed plans with patient for ongoing care management follow up and provided patient with direct contact information for care management team   Patient Goals/Self-Care Activities: . Over the next 120 days, patient will:   - call for medicine refill 2 or 3 days before it runs out - call if I am  sick and can't take my medicine - keep a list of all the medicines I take; vitamins and herbals too - learn to read medicine labels - use a pillbox to sort medicine - use an alarm clock or phone to remind me to take my medicine - drink 6 to 8 glasses of water each day - eat 3 to 5 servings of fruits and vegetables each day - eat 5 or 6 small meals each day - limit fast food meals to no more than 1 per week - manage portion size - prepare main meal at home 3 to 5 days each week - read food labels for fat, fiber, carbohydrates and portion size - be open to making changes - I can manage, know and watch for signs of a heart attack - if I have chest pain, call for help - learn about small changes that will make a big difference - learn my personal risk factors - education plan reviewed and/or amended - empathy and reassurance conveyed - family/caregiver participation in learning encouraged - health literacy screen reviewed - patient's preferred learning methods utilized - privacy ensured - questions encouraged - readiness to learn monitored   Follow Up Plan:  Telephone follow up appointment with care management team member scheduled for:05-25-2020 at 59 am       Plan:Telephone follow up appointment with care management team member scheduled for:  05-25-2020 at 70 am  Noreene Larsson RN, MSN, [facility] Mobile: [phone]

## 2020-03-16 NOTE — Patient Instructions (Signed)
Visit Information  PATIENT GOALS: Patient Care Plan: General Social Work (Adult)    Problem Identified: Caregiver Stress     Long-Range Goal: Caregiver Coping Optimized for Daughter   Start Date: 02/22/2020  Priority: Medium  Note:   Evidence-based guidance:   Recognize and validate the complex nature of dementia, as well as the impact on the caregiver and extended family; provide education and support to align with needs and stage of dementia.   Acknowledge feelings of grief associated with dementia such as loss of relationship, recreational activities, future with patient and loss associated with out-of-home placement.   Encourage verbalization of feelings without ruminating.   Discuss services that may help balance the caregiving role and living the life he/she chooses such as professionally or peer-led support groups, web-based support, counseling and services such as respite care or in-home help.   Refer for or provide psychoeducation activities such as cognitive reframing to improve family/caregiver quality of life, wellbeing, confidence, perception of burden, mental health and self-efficacy.   Encourage use of individualized coping strategies that may include yoga, spiritual support, distraction, exercise, relaxation, music, massage, music therapy and outdoor activities to utilize nature's restorative properties.   Suggest home-monitoring system that may reduce family/caregiver worry and improve sleep.   Provide proactive acknowledgement of potential for depressive symptoms to appear; normalize response to burden of caregiving; refer to primary care provider or for mental health services.     Timeframe:  Long-Range Goal Priority:  Medium  Start Date:  02/22/20                         Expected End Date:  05/21/20                    Follow Up Date- 04/18/20  Current Barriers:  . Financial constraints . Level of care concerns . ADL IADL limitations . Social  Isolation . Limited access to caregiver . Memory Deficits . Lacks knowledge of community resource: personal care service resources within the area that may benefit patient . Lack of socialization   Clinical Social Work Clinical Goal(s):  [name] Over the next 90 days, client will work with SW to address concerns related to lack of care/support within the home  Interventions: . Patient interviewed and appropriate assessments performed . LCSW spoke wit patient's daughter on 02/22/20 as LCSW was unable to reach patient. Patient is stable per daughter but is anxious about his mobility and is eager to gain a ramp through [facility]. Patient is on the wait list for another 2 months to get ramp installed. CCM LCSW provided family with [facility] [employer] information and their contact information in the case that they can set up his ramp sooner.  . Provided patient and family with information about personal care service resources within the area. Patient reports that he lives with his spouse and daughter and that his daughter and spouse are his main sources of support that assist him with transportation/meal prep/daily needs.  . Discussed plans with patient for ongoing care management follow up and provided patient with direct contact information for care management team . Advised patient to contact CCM team with any urgent concerns . Assisted patient/caregiver with obtaining information about health plan benefits . Provided education and assistance to client regarding Advanced Directives. . Provided education to patient/caregiver regarding level of care options. [name] LCSW provided self-care education. Patient has ongoing issue with memory and sleep. Patient was encouraged to  take medications as prescribed and to ask for help (daughter or spouse) when needed and to try to check blood pressure and record it once a day per PCP's recommendation. Family continue to encourage patient to keep up with blood pressure  recordings.  . Patient has been having pain and discomfort in his right knee which has affected his mobility. PT has restarted. Patient has been wearing his compression stockings. . Daughter/patient reports pt is still taking melatonin dose of 68m per night but still struggles with insomnia. . Daughter/patient declined needing MOW's at this time.  . Patient admits to feeling lonely at times due to isolation related to COVID-19. LCSW provided family with community resource education for socialization ([place] [facility] wish for LCSW to mail this information to their address. LCSW sent out resources on 07/16/19. Emotional support and coping skill education provided as well. Patient was very receptive to this information. UPDATE- [place] [facility] [place] They are expecting to open up again in October and patient is agreeable to attend center in order to gain socialization.  . Daughter reports ongoing swelling in patient's legs and feet. Patient is not taking his lasix as he has had a difficult time with making it to the bathroom in time. LCSW encouraged daughter to ensure that patient elevates his legs and wears his compression stockings.  .Marland KitchenLCSW completed referral for [facility] to assist with ramp resources, Meals on Wheels and to place patient on the wait list for [facility] through [facility]. Family report that they are unable to pay out of pocket for an aide but that he needs this level of support. Family wish to gain a ramp (for both patient and spouse) at the front of their home in order to prevent future falls.  - Patient Self Care Activities:  . Attends all scheduled provider appointments . Calls provider office for new concerns or questions    Task: Recognize and Manage Caregiver Stress   Note:   Care Management Activities:    - active listening utilized - caregiver  stress acknowledged - complementary therapy use encouraged - consideration of in-home help encouraged - decision-making supported - engagement with primary care provider encouraged - healthy lifestyle promoted - mental health treatment facilitated - participation in support group encouraged - positive reinforcement provided - self-reflection promoted - social and community activities encouraged - spiritual activity use promoted - support group information provided - verbalization of feelings encouraged    Notes:    Patient Care Plan: PharmD - Medication Managment    Problem Identified: Disease Progression     Long-Range Goal: Disease Progression Prevented or Minimized   Start Date: 01/19/2020  Expected End Date: 04/18/2020  Recent Progress: On track  Priority: High  Note:   Current Barriers:  . Chronic Disease Management support, education, and care coordination needs related to hypertension, osteoarthritis, hyperlipidemia, insomnia and memory loss . Cognitive Deficits  Pharmacist Clinical Goal(s):  .Marland KitchenOver the next 90 days, caregiver/patient will adhere to plan to optimize therapeutic regimen for hypertension as evidenced by report of adherence to recommended medication management changes through collaboration with PharmD and provider.   Interventions: . 1:1 collaboration with KOlin Hauser DO regarding development and update of comprehensive plan of care as evidenced by provider attestation and co-signature . Inter-disciplinary care team collaboration (see longitudinal plan of care) . Daughter reports that she recently had  surgery and is recovering. o Unable to review BP readings today. Reschedule time to review these with caregiver o Denies any medication questions/concerns on behalf of patient today  Patient Goals/Self-Care Activities . Over the next 90 days, caregiver/patient will:  - take medications as prescribed - check blood pressure, document, and  provide at future appointments  Follow Up Plan: Telephone follow up appointment with care management team member scheduled for: 03/17/2020 9:45 AM    Patient Care Plan: RNCM: Dementia (Adult)    Problem Identified: RNCM: Management of Dementia and Memory Loss   Priority: Medium    Long-Range Goal: RNCM: Management of Dementia and Memory Loss   Priority: Medium  Note:   Current Barriers:  [name] Knowledge Deficits related to resources available for patient with dementia and memory loss  . Lacks caregiver support.  . Cognitive Deficits . Unable to independently manage changes related to dementia and memory loss  . Unable to self administer medications as prescribed . Does not adhere to prescribed medication regimen . Lacks social connections . Unable to perform ADLs independently . Unable to perform IADLs independently . Does not contact provider office for questions/concerns  Nurse Case Manager Clinical Goal(s):  [name] Over the next 120 days, patient will verbalize understanding of plan for effective management of dementia and memory loss  . Over the next 120 days, patient will attend all scheduled medical appointments: no upcoming appointments but the patients daughter knows to call for changes or needs  . Over the next 120 days, patient will demonstrate improved adherence to prescribed treatment plan for dementia and memory loss as evidenced bypatient being as independent as possible and working with CCM team to optimize health and well being related to dementia and memory loss.   Interventions:  . 1:1 collaboration with [name], DO regarding development and update of comprehensive plan of care as evidenced by provider attestation and co-signature . Inter-disciplinary care team collaboration (see longitudinal plan of care) . Evaluation of current treatment plan related to dementia and memoryt loss  and patient's adherence to plan as established by provider. 03-16-2020: The  patients is about the same. The patients daughter states other than a little  complaint of dizziness the patient is doing well. Denies any safety concerns.  . Advised patient to call the office for changes in condition or questions  . Provided education to patient re: caregiver burn out and support available. 03-16-2020: The patients daughter is having a procedure this afternoon but denies any new concerns related to caregiver concerns  . Discussed plans with patient for ongoing care management follow up and provided patient with direct contact information for care management team . Provided patient with dementia and memory loss  educational materials related to changes in memory. The daughter states that the patient seems to be in a "better mood" right now. 03-16-2020: Denies any changes in memory at this time. About the same.   Patient Goals/Self-Care Activities Over the next 120 days, patient will:  - Patient will self administer medications as prescribed Patient will attend all scheduled provider appointments Patient will call pharmacy for medication refills Patient will attend church or other social activities Patient will continue to perform ADL's independently Patient will call provider office for new concerns or questions Patient will work with BSW to address care coordination needs and will continue to work with the clinical team to address health care and disease management related needs.   - action plan for worsening symptoms mutually developed -  community resource information provided - pain assessed - pain management plan developed - symptom review completed  Follow Up Plan: Telephone follow up appointment with care management team member scheduled for: 05-25-2020 at 28 am       Task: Develop Strategies to Manage Behavior   Note:   Care Management Activities:    - action plan for worsening symptoms mutually developed - community resource information provided - pain  assessed - pain management plan developed - symptom review completed       Patient Care Plan: RNCM: Hypertension (Adult)    Problem Identified: RNCM: Hypertension (Hypertension)   Priority: Medium    Goal: RNCM: Hypertension Monitored   Priority: Medium  Note:   Objective:  . Last practice recorded BP readings:  . BP Readings from Last 3 Encounters: .  02/15/20 . (!) 156/85 .  02/08/20 . (!) 154/71 .  01/03/20 . (!) 154/84 .    [name] Most recent eGFR/CrCl: No results found for: EGFR  No components found for: CRCL Current Barriers:  [name] Knowledge Deficits related to basic understanding of hypertension pathophysiology and self care management . Knowledge Deficits related to understanding of medications prescribed for management of hypertension . Cognitive Deficits . Limited Social Support . Unable to independently manage HTN . Unable to self administer medications as prescribed . Does not adhere to prescribed medication regimen . Lacks social connections . Unable to perform ADLs independently . Unable to perform IADLs independently . Does not contact provider office for questions/concerns Case Manager Clinical Goal(s):  [name] Over the next 120 days, patient will verbalize understanding of plan for hypertension management . Over the next 120 days, patient will demonstrate improved adherence to prescribed treatment plan for hypertension as evidenced by taking all medications as prescribed, monitoring and recording blood pressure as directed, adhering to low sodium/DASH diet . Over the next 120 days, patient will demonstrate improved health management independence as evidenced by checking blood pressure as directed and notifying PCP if SBP>160 or DBP > 90, taking all medications as prescribe, and adhering to a low sodium diet as discussed. Interventions:  . Collaboration with [name], DO regarding development and update of comprehensive plan of care as evidenced by provider  attestation and co-signature . Inter-disciplinary care team collaboration (see longitudinal plan of care) . UNABLE to independently:manage HTN . Evaluation of current treatment plan related to hypertension self management and patient's adherence to plan as established by provider. 03-16-2020: The patients daughter is working closely with the pharm D and RNCM to manage blood pressure. The daughter states the patient still has edema and swelling but it is not worse than usual. Will continue to monitor.  . Provided education to patient re: stroke prevention, s/s of heart attack and stroke, DASH diet, complications of uncontrolled blood pressure . Reviewed medications with patient and discussed importance of compliance. 03-16-2020: The patient is compliant with medications regimen, also working with the pharm D on a regular basis. Denies any concerns with medication management or cost.  . Discussed plans with patient for ongoing care management follow up and provided patient with direct contact information for care management team . Advised patient, providing education and rationale, to monitor blood pressure daily and record, calling PCP for findings outside established parameters.  Patient Goals/Self-Care Activities . Over the next 120 days, patient will:  - Self administers medications as prescribed Calls provider office for new concerns, questions, or BP outside discussed parameters Checks BP and records as discussed Follows a  low sodium diet/DASH diet - blood pressure trends reviewed: 01-20-2020: 158/100 pulse 62, 01-21-2020 132/91, pulse 86; 01-23-2020 145/97, pulse 80.  The patient is also working with the CCM pharmacist to help with management of blood pressure medications  - depression screen reviewed - home or ambulatory blood pressure monitoring encouraged Follow Up Plan: Telephone follow up appointment with care management team member scheduled for: 05-25-2020 at 1030 am   Task: RNCM: Identify  and Monitor Blood Pressure Elevation   Note:   Care Management Activities:    - blood pressure trends reviewed - depression screen reviewed - home or ambulatory blood pressure monitoring encouraged       Patient Care Plan: RNCM: HLD management    Problem Identified: RNCM: HLD management   Priority: Medium    Long-Range Goal: RNCM: HLD Management   Priority: Medium  Note:   Current Barriers:  . Poorly controlled hyperlipidemia, complicated by memory loss, dementia, elevated blood pressures  . Current antihyperlipidemic regimen: Omega 3 1200 mg daily  . Most recent lipid panel:  . Lab Results .  Component . Value . Date .   [name] CHOL . 176 . 06/10/2018 .   [name] HDL . 35 (L) . 06/10/2018 .   [name] [place] . 121 (H) . 06/10/2018 .   [name] TRIG . 98 . 06/10/2018 .   [name] CHOLHDL . 5.0 (H) . 06/10/2018 .    [name] ASCVD risk enhancing conditions: age >76, HTN,  former smoker . Unable to independently manage HLD  . Lacks social connections . Does not contact provider office for questions/concerns  RN Care Manager Clinical Goal(s):  [name] Over the next 120 days, patient will work with Consulting civil engineer, providers, and care team towards execution of optimized self-health management plan . Over the next 120 days, patient will verbalize understanding of plan for effective management of HLD  . Over the next 120 days, patient will work with [facility] [place] team and pcp to address needs related to management of HLD   Interventions: . Collaboration with [name], DO regarding development and update of comprehensive plan of care as evidenced by provider attestation and co-signature . Inter-disciplinary care team collaboration (see longitudinal plan of care) . Medication review performed; medication list updated in electronic medical record.  Bertram Savin care team collaboration (see longitudinal plan of care) . Referred to pharmacy team for assistance with HLD medication management . Evaluation  of current treatment plan related to HLD and patient's adherence to plan as established by provider. . Advised patient to call the office with changes or new questions  . Provided education to patient re: heart healthy diet. 03-16-2020: The patient is eating well and the patients daughter tries to make sure he watches sodium and fats. . Discussed plans with patient for ongoing care management follow up and provided patient with direct contact information for care management team   Patient Goals/Self-Care Activities: . Over the next 120 days, patient will:   - call for medicine refill 2 or 3 days before it runs out - call if I am sick and can't take my medicine - keep a list of all the medicines I take; vitamins and herbals too - learn to read medicine labels - use a pillbox to sort medicine - use an alarm clock or phone to remind me to take my medicine - drink 6 to 8 glasses of water each day - eat 3 to 5 servings of fruits and vegetables each day - eat  5 or 6 small meals each day - limit fast food meals to no more than 1 per week - manage portion size - prepare main meal at home 3 to 5 days each week - read food labels for fat, fiber, carbohydrates and portion size - be open to making changes - I can manage, know and watch for signs of a heart attack - if I have chest pain, call for help - learn about small changes that will make a big difference - learn my personal risk factors - education plan reviewed and/or amended - empathy and reassurance conveyed - family/caregiver participation in learning encouraged - health literacy screen reviewed - patient's preferred learning methods utilized - privacy ensured - questions encouraged - readiness to learn monitored   Follow Up Plan: Telephone follow up appointment with care management team member scheduled for:05-25-2020 at 26 am     Task: RNCM: Identify Deficit and Optimize Health Literacy   Note:   Care Management Activities:     - education plan reviewed and/or amended - empathy and reassurance conveyed - family/caregiver participation in learning encouraged - health literacy screen reviewed - patient's preferred learning methods utilized - privacy ensured - questions encouraged - readiness to learn monitored         Patient verbalizes understanding of instructions provided today and agrees to view in [place].   Telephone follow up appointment with care management team member scheduled for: 05-25-2020 at 55 am  Noreene Larsson RN, MSN, [place] Mobile: [phone]

## 2020-03-17 ENCOUNTER — Ambulatory Visit: Payer: Medicare Other | Admitting: Pharmacist

## 2020-03-17 DIAGNOSIS — R413 Other amnesia: Secondary | ICD-10-CM

## 2020-03-17 DIAGNOSIS — I10 Essential (primary) hypertension: Secondary | ICD-10-CM | POA: Diagnosis not present

## 2020-03-17 DIAGNOSIS — F0280 Dementia in other diseases classified elsewhere without behavioral disturbance: Secondary | ICD-10-CM

## 2020-03-17 DIAGNOSIS — G3183 Dementia with Lewy bodies: Secondary | ICD-10-CM

## 2020-03-17 DIAGNOSIS — E782 Mixed hyperlipidemia: Secondary | ICD-10-CM | POA: Diagnosis not present

## 2020-03-17 NOTE — Chronic Care Management (AMB) (Signed)
Chronic Care Management Pharmacy Note  03/17/2020 Name:  Randy [name] MRN:  [id number] DOB:  08-29-1934  Subjective: Randy [name] is an 86 y.o. year old male who is a primary patient of [name], DO.  The CCM team was consulted for assistance with disease management and care coordination needs.    Engaged with patient by telephone for follow up visit in response to provider referral for pharmacy case management and/or care coordination services.   Consent to Services:  The patient was given information about Chronic Care Management services, agreed to services, and gave verbal consent prior to initiation of services.  Please see initial visit note for detailed documentation.   Patient Care Team: [name], DO as PCP - General (Family Medicine) Greg Cutter, LCSW as Social Worker (Licensed Clinical Social Worker) [facility], Virl Diamond, [place] as Pharmacist Vanita Ingles, RN as Case Manager  Recent office visits: None  Recent consult visits: None  Hospital visits: Last hospital admission at [facility] 10/1-10/5 related to COVID-19 infection.   Objective:  Lab Results  Component Value Date   CREATININE 0.83 10/12/2019   CREATININE 0.71 10/11/2019   CREATININE 0.84 10/10/2019    Social History   Tobacco Use  Smoking Status Former Smoker  . Packs/day: 1.00  . Years: 12.00  . Pack years: 12.00  . Types: Cigarettes  . Quit date: 61  . Years since quitting: 54.2  Smokeless Tobacco Never Used   BP Readings from Last 3 Encounters:  02/15/20 (!) 156/85  02/08/20 (!) 154/71  01/03/20 (!) 154/84   Pulse Readings from Last 3 Encounters:  02/15/20 80  02/08/20 61  01/03/20 73   Wt Readings from Last 3 Encounters:  02/15/20 228 lb (103.4 kg)  02/08/20 228 lb (103.4 kg)  01/03/20 225 lb (102.1 kg)    Assessment: Review of patient past medical history, allergies, medications, health status, including review of  consultants reports, laboratory and other test data, was performed as part of comprehensive evaluation and provision of chronic care management services.   SDOH:  (Social Determinants of Health) assessments and interventions performed: None   CCM Care Plan  Allergies  Allergen Reactions  . Antihistamines, Chlorpheniramine-Type Other (See Comments)    Can't urinate    Medications Reviewed Today    Reviewed by Vella Raring, [place] (Pharmacist) on 03/17/20 at 1038  Med List Status: <None>  Medication Order Taking? Sig Documenting Provider Last Dose Status Informant  carbidopa-levodopa (SINEMET IR) 25-100 MG tablet [id number] Yes Take by mouth. Take 2 tablets by mouth 4 (four) times daily [provider] Taking Active   cholecalciferol (VITAMIN D) 1000 units tablet [id number]  Take 1,000 Units by mouth daily. [provider]  Active Other  Coenzyme Q10 (CO Q 10) 100 MG CAPS [id number]  Take 100 mg by mouth daily.  [provider]  Active Other  diclofenac Sodium (VOLTAREN) 1 % GEL [id number] Yes Apply 2 g topically 4 (four) times daily as needed. To knee arthritis pain [name], DO Taking Active Pharmacy Records  docusate sodium (COLACE) 100 MG capsule [id number]  Take 1 capsule (100 mg total) by mouth 2 (two) times daily.  Patient not taking: Reported on 02/08/2020   [name], MD  Active   enalapril (VASOTEC) 10 MG tablet [id number] Yes Take 1 tablet (10 mg total) by mouth daily. [name], DO Taking Active   fluticasone [facility]) 50 MCG/ACT nasal spray [id number]  Yes Place 2 sprays into both nostrils daily as needed for allergies.  [provider] Taking Active Other           Med Note [name], [name]   Mon Oct 26, 2018  1:07 PM)    furosemide (LASIX) 20 MG tablet [id number] Yes Take 1 tablet (20 mg total) by mouth every other day. [name], DO Taking Active Pharmacy Records           Med Note [facility],  [facility]   Fri Dec 17, 2019  9:48 AM) Taking on Mondays, Wednesdays and Fridays  melatonin 5 MG TABS [id number]  Take 10 mg by mouth at bedtime as needed (sleep). [provider]  Active Other  Multiple Vitamin (MULTIVITAMIN WITH MINERALS) TABS tablet [id number]  Take 1 tablet by mouth daily. [name], MD  Active   mupirocin ointment (BACTROBAN) 2 % [id number]  Apply topically 2 (two) times daily. [name], NP  Active   OLANZapine (ZYPREXA) 5 MG tablet [id number] No TAKE 1 TABLET (5 MG TOTAL) BY MOUTH AT BEDTIME AS NEEDED (AGITATION).  Patient not taking: No sig reported   [name], DO Not Taking Active   Omega-3 Fatty Acids (FISH OIL) 1200 MG CAPS [id number]  Take 1,200 mg by mouth 2 (two) times daily.  [provider]  Active Other  omeprazole (PRILOSEC) 20 MG capsule [id number]  Take 20 mg by mouth daily. [provider]  Active Other  rivastigmine (EXELON) 1.5 MG capsule [id number] Yes Take 1 capsule by mouth 2 (two) times daily with a meal. [provider] Taking Active   vitamin C (ASCORBIC ACID) 500 MG tablet [id number]  Take 500 mg by mouth daily. [provider]  Active Other  zinc sulfate 220 (50 Zn) MG capsule [id number]  Take 1 capsule (220 mg total) by mouth daily.  Patient not taking: Reported on 02/08/2020   [name], MD  Active           Patient Active Problem List   Diagnosis Date Noted  . Bilateral primary osteoarthritis of knee 02/15/2020  . Pressure injury of buttock, stage 1   . Delirium   . COVID-19 virus infection 10/08/2019  . Unable to care for self 10/08/2019  . Caregiver unable to cope 10/08/2019  . Recurrent falls 10/08/2019  . Lewy body dementia without behavioral disturbance ([place])   . Prostate cancer ([place]) 06/30/2019  . BPH with urinary obstruction 11/06/2018  . Acute cholecystitis 09/08/2018  . Myalgia due to statin 02/24/2018  . Tricompartment osteoarthritis of left knee  11/14/2017  . Generalized weakness 04/28/2017  . Chronic low back pain 03/12/2017  . Knee pain, chronic 10/01/2016  . Chronic venous insufficiency 08/30/2016  . BPH without obstruction/lower urinary tract symptoms 03/06/2016  . Osteoarthritis of multiple joints 03/06/2016  . Degenerative joint disease (DJD) of lumbar spine 03/06/2016  . Essential hypertension 03/05/2016  . Hyperlipidemia 03/05/2016  . Osteoporosis 03/05/2016  . GERD (gastroesophageal reflux disease) 03/05/2016  . Bilateral lower extremity edema 03/05/2016    Immunization History  Administered Date(s) Administered  . Fluad Quad(high Dose 65+) 11/07/2018, 11/10/2019  . Influenza, High Dose Seasonal PF 10/01/2016, 10/07/2017  . Pneumococcal Conjugate-13 09/11/2016  . Pneumococcal Polysaccharide-23 10/31/2017  . Tdap 03/05/2016    Conditions to be addressed/monitored: HTN, edema, memory loss  Care Plan : PharmD - Medication Managment  Updates made by Vella Raring, [place] since 03/17/2020 12:00 AM    Problem: Disease Progression  Long-Range Goal: Disease Progression Prevented or Minimized   Start Date: 01/19/2020  Expected End Date: 04/18/2020  Recent Progress: On track  Priority: High  Note:   Current Barriers:  . Chronic Disease Management support, education, and care coordination needs related to hypertension, osteoarthritis, hyperlipidemia, insomnia and memory loss . Cognitive Deficits  Pharmacist Clinical Goal(s):  [name] Over the next 90 days, caregiver/patient will adhere to plan to optimize therapeutic regimen for hypertension as evidenced by report of adherence to recommended medication management changes through collaboration with PharmD and provider.   Interventions: . 1:1 collaboration with [name], DO regarding development and update of comprehensive plan of care as evidenced by provider attestation and co-signature . Inter-disciplinary care team collaboration (see longitudinal  plan of care) . Perform chart review o Note Office tried to reach patient on 3/7 regarding scheduling of Annual Wellness Visit (AWV).  [name] Today provide caregiver with contact information to call to schedule AWV . Today daughter/caregiver reports patient's neck looks healed  Hypertension . Reports patient taking: o Enalapril 10 mg daily in the morning o Furosemide 20 mg on Mondays, Wednesdays and Fridays . Reports recent home BP readings:  AM PM Notes  4 - March - 143/97, HR 73   5 - March - 138/72, HR 60   6 - March - 133/89, HR 78   7 - March 129/85, HR 98 146/104, HR 69   8 - March - -   9 - March 153/103, HR 62 -   10 - March - -   11 - March -      . Reports patient missed a couple of doses of his blood pressure medications over the past week o Note daughter reports that she recently had surgery, but caregiver reports now able to resume checking behind patient and her mom to make sure that medication is given to patient each morning. . Have counseled on impact of salt/sodium on blood pressure. . Encourage caregiver to continue to monitor home blood pressure and keeping log of results to review during next telephone appointment . Counsel daughter to call office sooner for reading outside of established parameters or for new or worsening symptoms . Reports swelling in patient's legs similar remains about the same o Reports family continues to encourage patient to elevate feet, but unable to remember to do on his own o Has reported patient unable to use compression stockings due to physical/memory difficulty using tools/strategies that previously worked to getting them on  Medication Adherence  Note patient using weekly pillbox with slots for four times daily dosing  Caregiver reports increased patient's Sinemet (25/100 mg) dose to 2 tablets 4 times a day as directed by Neurology  Reports patient doing better with remembering fourth dose of carbidopa/levodopa with assistance from  family members  Have discussed with caregiver additional strategies for using medication alarm as adherence aid   Patient Goals/Self-Care Activities . Over the next 90 days, caregiver/patient will:  - take medications as prescribed - check blood pressure, document, and provide at future appointments  Follow Up Plan: Telephone follow up appointment with care management team member scheduled for: 4/29 at 9:15 am      Medication Assistance: None required.  Patient affirms current coverage meets needs.  Patient's preferred pharmacy is:  [facility] [address] [place] [address] Phone: [phone] Fax: [phone]  [id number] - [place], [place] [address]  [address] [address] Phone: [phone] Fax: [phone]  Uses pill box? Yes  Follow Up:  Caregiver agrees to Care Plan and Follow-up.  Plan: Telephone follow up appointment with care management team member scheduled for:   4/29 at 9:15 am  [name], PharmD, [place] [phone]

## 2020-03-17 NOTE — Patient Instructions (Signed)
Visit Information  PATIENT GOALS: Goals Addressed            This Visit's Progress   . Pharmacy Goals       Please continue to monitor home blood pressure and keep log of results to share at next appointment  Please remember to: -avoid caffeine or exercise 30 minutes before taking readings -rest relaxed in chair for at least 5 minutes prior to taking reading (see "Blood Pressure Measurement Instructions" handout for further reminders)  Feel free to call me with any questions or concerns. I look forward to our next call!   [name], PharmD, [place] [phone]       The patient verbalized understanding of instructions, educational materials, and care plan provided today and declined offer to receive copy of patient instructions, educational materials, and care plan.   Telephone follow up appointment with care management team member scheduled for: 4/29 at 9:15 am  [name], PharmD, [place] [phone]

## 2020-03-21 ENCOUNTER — Other Ambulatory Visit: Payer: Self-pay | Admitting: Family Medicine

## 2020-03-21 DIAGNOSIS — I10 Essential (primary) hypertension: Secondary | ICD-10-CM

## 2020-04-11 ENCOUNTER — Ambulatory Visit (INDEPENDENT_AMBULATORY_CARE_PROVIDER_SITE_OTHER): Payer: Medicare Other | Admitting: Licensed Clinical Social Worker

## 2020-04-11 DIAGNOSIS — F0280 Dementia in other diseases classified elsewhere without behavioral disturbance: Secondary | ICD-10-CM | POA: Diagnosis not present

## 2020-04-11 DIAGNOSIS — G3183 Dementia with Lewy bodies: Secondary | ICD-10-CM | POA: Diagnosis not present

## 2020-04-11 DIAGNOSIS — R413 Other amnesia: Secondary | ICD-10-CM

## 2020-04-11 DIAGNOSIS — E782 Mixed hyperlipidemia: Secondary | ICD-10-CM

## 2020-04-11 DIAGNOSIS — I10 Essential (primary) hypertension: Secondary | ICD-10-CM

## 2020-04-11 NOTE — Patient Instructions (Signed)
Licensed Clinical Social Worker Visit Information  Goals we discussed today:  Goals Addressed            This Visit's Progress   . SW-Manage My Health and Emotions        Timeframe:  Long-Range Goal Priority:  Medium  Start Date:  04/11/20                         Expected End Date:  07/11/20                  Follow Up Date- 05/24/20  Current Barriers:  . Financial constraints . Level of care concerns . ADL IADL limitations . Social Isolation . Limited access to caregiver . Memory Deficits . Lacks knowledge of community resource: personal care service resources within the area that may benefit patient . Lack of socialization   Clinical Social Work Clinical Goal(s):  [name] Over the next 90 days, client will work with SW to address concerns related to lack of care/support within the home  Interventions: . Patient interviewed and appropriate assessments performed . Patient was informed that current CCM LCSW will be leaving position next month and his next CCM Social Work follow up visit will be with another LCSW. Patient was appreciative of support provided and receptive to news . LCSW spoke wit patient's daughter on 04/11/20 as LCSW was unable to reach patient. Patient is stable per daughter but is anxious about his mobility and is eager to gain a ramp through [facility]. Patient is on the wait list for another 2 months to get ramp installed. CCM LCSW provided family with [facility] [employer] information and their contact information in the case that they can set up his ramp sooner.  . Provided patient and family with information about personal care service resources within the area. Patient reports that he lives with his spouse and daughter and that his daughter and spouse are his main sources of support that assist him with transportation/meal prep/daily needs.  . Discussed plans with patient for ongoing care management follow up and provided patient with direct contact information for  care management team . Advised patient to contact CCM team with any urgent concerns . Assisted patient/caregiver with obtaining information about health plan benefits . Provided education and assistance to client regarding Advanced Directives. . Provided education to patient/caregiver regarding level of care options. [name] LCSW provided self-care education. Patient has ongoing issue with memory and sleep. Patient was encouraged to take medications as prescribed and to ask for help (daughter or spouse) when needed and to try to check blood pressure and record it once a day per PCP's recommendation. Family continue to encourage patient to keep up with blood pressure recordings. Family assist patient with taking medications as well.  . Patient has been having pain and discomfort in his right knee which has affected his mobility. PT has ended and daughter reports that there no use in making a new referral for services because patient is not able to make progress with their treatment goals. Per daughter, patient has been wearing his compression stockings. . Daughter/patient reports pt is still taking melatonin dose of 10mg  per night but still struggles with insomnia. . Daughter reports that she believes patient in on the wait list for Meals [employer] Wheels.  . Patient admits to feeling lonely at times due to isolation related to COVID-19. LCSW provided family with community resource education for socialization ([place] and  [place]) Family wish for LCSW to mail this information to their address. LCSW sent out resources on 07/16/19. Emotional support and coping skill education provided as well. Patient was very receptive to this information. UPDATE- Family report that they contacted [facility] and was informed that they have closed again due to [place]. They are expecting to open up again in October and patient is agreeable to attend center in order to gain socialization.  . Daughter  reports ongoing swelling in patient's legs and feet. Patient is not taking his lasix as he has had a difficult time with making it to the bathroom in time. LCSW encouraged daughter to ensure that patient elevates his legs and wears his compression stockings.  [name] LCSW completed referral for [facility] to assist with ramp resources, Me[facility] Wheels and to place patient on the wait list for [facility] through [facility]. Family report that they are unable to pay out of pocket for an aide but that he needs this level of support. Family wish to gain a ramp (for both patient and spouse) at the front of their home in order to prevent future falls.  - Patient Self Care Activities:  . Attends all scheduled provider appointments . Calls provider office for new concerns or questions       Eula Fried, BSW, MSW, [email or web address] Phone: [phone]

## 2020-04-11 NOTE — Chronic Care Management (AMB) (Signed)
Chronic Care Management    Clinical Social Work Note  04/11/2020 Name: Randy [name] MRN: [id number] DOB: 1934-10-06  Randy [name] is a 86 y.o. year old male who is a primary care patient of [name], DO. The CCM team was consulted to assist the patient with chronic disease management and/or care coordination needs related to: Level of Care Concerns.   Collaboration with daughter during outreach for follow up visit in response to provider referral for social work chronic care management and care coordination services.   Consent to Services:  The patient was given the following information about Chronic Care Management services today, agreed to services, and gave verbal consent: 1. CCM service includes personalized support from designated clinical staff supervised by the primary care provider, including individualized plan of care and coordination with other care providers 2. 24/7 contact phone numbers for assistance for urgent and routine care needs. 3. Service will only be billed when office clinical staff spend 20 minutes or more in a month to coordinate care. 4. Only one practitioner may furnish and bill the service in a calendar month. 5.The patient may stop CCM services at any time (effective at the end of the month) by phone call to the office staff. 6. The patient will be responsible for cost sharing (co-pay) of up to 20% of the service fee (after annual deductible is met). Patient agreed to services and consent obtained.  Patient agreed to services and consent obtained.   Assessment: Review of patient past medical history, allergies, medications, and health status, including review of relevant consultants reports was performed today as part of a comprehensive evaluation and provision of chronic care management and care coordination services.     SDOH (Social Determinants of Health) assessments and interventions performed:    Advanced Directives Status: Not addressed in this  encounter.  CCM Care Plan  Allergies  Allergen Reactions  . Antihistamines, Chlorpheniramine-Type Other (See Comments)    Can't urinate    Outpatient Encounter Medications as of 04/11/2020  Medication Sig Note  . carbidopa-levodopa (SINEMET IR) 25-100 MG tablet Take by mouth. Take 2 tablets by mouth 4 (four) times daily   . cholecalciferol (VITAMIN D) 1000 units tablet Take 1,000 Units by mouth daily.   . Coenzyme Q10 (CO Q 10) 100 MG CAPS Take 100 mg by mouth daily.    . diclofenac Sodium (VOLTAREN) 1 % GEL Apply 2 g topically 4 (four) times daily as needed. To knee arthritis pain   . docusate sodium (COLACE) 100 MG capsule Take 1 capsule (100 mg total) by mouth 2 (two) times daily. (Patient not taking: Reported on 02/08/2020)   . enalapril (VASOTEC) 10 MG tablet Take 1 tablet (10 mg total) by mouth daily.   . fluticasone (FLONASE) 50 MCG/ACT nasal spray Place 2 sprays into both nostrils daily as needed for allergies.    . furosemide (LASIX) 20 MG tablet Take 1 tablet (20 mg total) by mouth every other day. 12/17/2019: Taking on Mondays, Wednesdays and Fridays  . melatonin 5 MG TABS Take 10 mg by mouth at bedtime as needed (sleep).   . Multiple Vitamin (MULTIVITAMIN WITH MINERALS) TABS tablet Take 1 tablet by mouth daily.   . mupirocin ointment (BACTROBAN) 2 % Apply topically 2 (two) times daily.   [name] OLANZapine (ZYPREXA) 5 MG tablet TAKE 1 TABLET (5 MG TOTAL) BY MOUTH AT BEDTIME AS NEEDED (AGITATION). (Patient not taking: No sig reported)   . Omega-3 Fatty Acids (FISH OIL) 1200  MG CAPS Take 1,200 mg by mouth 2 (two) times daily.    [name] omeprazole (PRILOSEC) 20 MG capsule Take 20 mg by mouth daily.   . rivastigmine (EXELON) 1.5 MG capsule Take 1 capsule by mouth 2 (two) times daily with a meal.   . vitamin C (ASCORBIC ACID) 500 MG tablet Take 500 mg by mouth daily.   [name] zinc sulfate 220 (50 Zn) MG capsule Take 1 capsule (220 mg total) by mouth daily. (Patient not taking: Reported on 02/08/2020)     No facility-administered encounter medications on file as of 04/11/2020.    Patient Active Problem List   Diagnosis Date Noted  . Bilateral primary osteoarthritis of knee 02/15/2020  . Pressure injury of buttock, stage 1   . Delirium   . COVID-19 virus infection 10/08/2019  . Unable to care for self 10/08/2019  . Caregiver unable to cope 10/08/2019  . Recurrent falls 10/08/2019  . Lewy body dementia without behavioral disturbance ([place])   . Prostate cancer ([place]) 06/30/2019  . BPH with urinary obstruction 11/06/2018  . Acute cholecystitis 09/08/2018  . Myalgia due to statin 02/24/2018  . Tricompartment osteoarthritis of left knee 11/14/2017  . Generalized weakness 04/28/2017  . Chronic low back pain 03/12/2017  . Knee pain, chronic 10/01/2016  . Chronic venous insufficiency 08/30/2016  . BPH without obstruction/lower urinary tract symptoms 03/06/2016  . Osteoarthritis of multiple joints 03/06/2016  . Degenerative joint disease (DJD) of lumbar spine 03/06/2016  . Essential hypertension 03/05/2016  . Hyperlipidemia 03/05/2016  . Osteoporosis 03/05/2016  . GERD (gastroesophageal reflux disease) 03/05/2016  . Bilateral lower extremity edema 03/05/2016    Care Plan : General Social Work (Adult)  Updates made by Greg Cutter, LCSW since 04/11/2020 12:00 AM    Problem: Caregiver Stress     Long-Range Goal: Caregiver Coping Optimized for Daughter   Start Date: 04/11/2020  Priority: Medium  Note:    Timeframe:  Long-Range Goal Priority:  Medium  Start Date:  04/11/20                         Expected End Date:  07/11/20                  Follow Up Date- 05/24/20  Current Barriers:  . Financial constraints . Level of care concerns . ADL IADL limitations . Social Isolation . Limited access to caregiver . Memory Deficits . Lacks knowledge of community resource: personal care service resources within the area that may benefit patient . Lack of socialization   Clinical Social  Work Clinical Goal(s):  [name] Over the next 90 days, client will work with SW to address concerns related to lack of care/support within the home  Interventions: . Patient interviewed and appropriate assessments performed . Patient was informed that current CCM LCSW will be leaving position next month and his next CCM Social Work follow up visit will be with another LCSW. Patient was appreciative of support provided and receptive to news . LCSW spoke wit patient's daughter on 04/11/20 as LCSW was unable to reach patient. Patient is stable per daughter but is anxious about his mobility and is eager to gain a ramp through [facility]. Patient is on the wait list for another 2 months to get ramp installed. CCM LCSW provided family with [facility] [employer] information and their contact information in the case that they can set up his ramp sooner.  . Provided patient and family  with information about personal care service resources within the area. Patient reports that he lives with his spouse and daughter and that his daughter and spouse are his main sources of support that assist him with transportation/meal prep/daily needs.  . Discussed plans with patient for ongoing care management follow up and provided patient with direct contact information for care management team . Advised patient to contact CCM team with any urgent concerns . Assisted patient/caregiver with obtaining information about health plan benefits . Provided education and assistance to client regarding Advanced Directives. . Provided education to patient/caregiver regarding level of care options. [name] LCSW provided self-care education. Patient has ongoing issue with memory and sleep. Patient was encouraged to take medications as prescribed and to ask for help (daughter or spouse) when needed and to try to check blood pressure and record it once a day per PCP's recommendation. Family continue to encourage patient to keep up with blood pressure  recordings. Family assist patient with taking medications daily as well.  . Patient has been having pain and discomfort in his right knee which has affected his mobility. PT has ended and daughter reports that there no use in making a new referral for services because patient is not able to make progress with their treatment goals. Per daughter, patient has been wearing his compression stockings. . Daughter/patient reports pt is still taking melatonin dose of 38m per night but still struggles with insomnia. . Daughter reports that she believes patient in on the wait list for [facility] [facility].  . Patient admits to feeling lonely at times due to isolation related to COVID-19. LCSW provided family with community resource education for socialization ([place] wish for LCSW to mail this information to their address. LCSW sent out resources on 07/16/19. Emotional support and coping skill education provided as well. Patient was very receptive to this information. UPDATE- Family report that they contacted [facility] was informed that they have closed again due to [place] They are expecting to open up again in October and patient is agreeable to attend center in order to gain socialization.  . Daughter reports ongoing swelling in patient's legs and feet. Patient is not taking his lasix as he has had a difficult time with making it to the bathroom in time. LCSW encouraged daughter to ensure that patient elevates his legs and wears his compression stockings.  .Marland KitchenLCSW completed referral for [facility] to assist with ramp resources, [facility] Whe[facility] and to place patient on the wait list for [facility] through [facility]. Family report that they are unable to pay out of pocket for an aide but that he needs this level of support. Family wish to gain a ramp (for both patient and spouse) at the front of their home in order to prevent future falls.  - Patient Self Care  Activities:  . Attends all scheduled provider appointments . Calls provider office for new concerns or questions       Follow Up Plan: SW will follow up with patient by phone over the next quarter      BEula Fried BGreenfield MSW, [email or web address] Phone: [phone]

## 2020-04-18 ENCOUNTER — Telehealth: Payer: Self-pay

## 2020-05-04 ENCOUNTER — Telehealth: Payer: Self-pay

## 2020-05-04 ENCOUNTER — Ambulatory Visit: Payer: Self-pay | Admitting: *Deleted

## 2020-05-04 NOTE — Telephone Encounter (Addendum)
[facility] agent reports pt.'d daughter scheduled an appointment tomorrow for pt. Blood in urine,chest pain, shortness of breath x several days. Agent asked nurse to call pt. And triage pt. Left message for pt. To call back and speak with a nurse about symptoms. Daughter at work, not with pt.

## 2020-05-04 NOTE — Telephone Encounter (Signed)
Patient called back to report symptoms of noting "stringing" blood in urine x 1 on Tuesday and reports no other occurrences at this time. Denies fever, no pain , no difficulty urinating or burning. Patient reports he does not have chest pain at this time but does report he has chest pain not sharp or pressure like. Reports shortness of breath with exertion but not now. appt scheduled for patient 05/05/20. Care advise given. Patient and wife verbalized understanding of care advise and to call back or go to ED or call 911 if symptoms worsen.   Reason for Disposition . Blood in urine  (Exception: could be normal menstrual bleeding)  Answer Assessment - Initial Assessment Questions 1. COLOR of URINE: "Describe the color of the urine."  (e.g., tea-colored, pink, red, blood clots, bloody)     "stringing" blood  2. ONSET: "When did the bleeding start?"      Noticed x 1 Tuesday  3. EPISODES: "How many times has there been blood in the urine?" or "How many times today?"     One time on Tuesday  4. PAIN with URINATION: "Is there any pain with passing your urine?" If Yes, ask: "How bad is the pain?"  (Scale 1-10; or mild, moderate, severe)    - MILD - complains slightly about urination hurting    - MODERATE - interferes with normal activities      - SEVERE - excruciating, unwilling or unable to urinate because of the pain      Denies  5. FEVER: "Do you have a fever?" If Yes, ask: "What is your temperature, how was it measured, and when did it start?"     no 6. ASSOCIATED SYMPTOMS: "Are you passing urine more frequently than usual?"     no 7. OTHER SYMPTOMS: "Do you have any other symptoms?" (e.g., back/flank pain, abdominal pain, vomiting)     no 8. PREGNANCY: "Is there any chance you are pregnant?" "When was your last menstrual period?"     na  Protocols used: URINE - BLOOD IN-A-AH

## 2020-05-05 ENCOUNTER — Telehealth: Payer: Self-pay

## 2020-05-05 ENCOUNTER — Ambulatory Visit (INDEPENDENT_AMBULATORY_CARE_PROVIDER_SITE_OTHER): Payer: Medicare Other | Admitting: Family Medicine

## 2020-05-05 ENCOUNTER — Encounter: Payer: Self-pay | Admitting: Family Medicine

## 2020-05-05 ENCOUNTER — Telehealth: Payer: Self-pay | Admitting: Pharmacist

## 2020-05-05 ENCOUNTER — Other Ambulatory Visit: Payer: Self-pay

## 2020-05-05 VITALS — BP 114/68 | HR 72 | Temp 97.5°F | Resp 17 | Ht 72.0 in

## 2020-05-05 DIAGNOSIS — R310 Gross hematuria: Secondary | ICD-10-CM | POA: Diagnosis not present

## 2020-05-05 DIAGNOSIS — I872 Venous insufficiency (chronic) (peripheral): Secondary | ICD-10-CM

## 2020-05-05 DIAGNOSIS — N401 Enlarged prostate with lower urinary tract symptoms: Secondary | ICD-10-CM

## 2020-05-05 DIAGNOSIS — M25552 Pain in left hip: Secondary | ICD-10-CM

## 2020-05-05 DIAGNOSIS — G8929 Other chronic pain: Secondary | ICD-10-CM

## 2020-05-05 DIAGNOSIS — R0789 Other chest pain: Secondary | ICD-10-CM

## 2020-05-05 DIAGNOSIS — N138 Other obstructive and reflux uropathy: Secondary | ICD-10-CM

## 2020-05-05 DIAGNOSIS — M159 Polyosteoarthritis, unspecified: Secondary | ICD-10-CM

## 2020-05-05 DIAGNOSIS — R600 Localized edema: Secondary | ICD-10-CM

## 2020-05-05 DIAGNOSIS — M25562 Pain in left knee: Secondary | ICD-10-CM

## 2020-05-05 DIAGNOSIS — M150 Primary generalized (osteo)arthritis: Secondary | ICD-10-CM

## 2020-05-05 DIAGNOSIS — M8949 Other hypertrophic osteoarthropathy, multiple sites: Secondary | ICD-10-CM

## 2020-05-05 DIAGNOSIS — I10 Essential (primary) hypertension: Secondary | ICD-10-CM

## 2020-05-05 LAB — POCT URINALYSIS DIPSTICK
Bilirubin, UA: NEGATIVE
Blood, UA: NEGATIVE
Glucose, UA: NEGATIVE
Ketones, UA: NEGATIVE
Nitrite, UA: NEGATIVE
Protein, UA: POSITIVE — AB
Spec Grav, UA: 1.01 (ref 1.010–1.025)
Urobilinogen, UA: 0.2 E.U./dL
pH, UA: 5 (ref 5.0–8.0)

## 2020-05-05 MED ORDER — TRAMADOL HCL 50 MG PO TABS: 50.0000 mg | ORAL_TABLET | Freq: Three times a day (TID) | ORAL | 2 refills | Status: DC | PRN

## 2020-05-05 MED ORDER — CIPROFLOXACIN HCL 500 MG PO TABS: 500.0000 mg | ORAL_TABLET | Freq: Two times a day (BID) | ORAL | 0 refills | Status: DC

## 2020-05-05 NOTE — Progress Notes (Signed)
Subjective:    Patient ID: Randy [name], male    DOB: 17-Aug-1934, 86 y.o.   MRN: [id number]  Randy [name] is a 86 y.o. male presenting on 05/05/2020 for Hematuria (Pt reports hematuria x 1 episode x 3 days ago. He describe it as a dark string appearance. Pt denies any other urinary issue, no dysuria, urinary frequency or urgency. ), Knee Pain (Intermittent Chronic left knee pain. He state the pain worsen with movement. He also complains of Left hip pain with movement. ), and Chest Pain (Intermittent LUQ chest pains only with movement. He describe it as a pressure sensation x 2 weeks )  Patient presents for a same day appointment.   HPI   Intermittent Chest Pain / Pressure Reports 2x week or more uncertain timeline, with some pressure on L side of chest and some dull ache pain. No active pain or pressure right now in office. Last EKG 10/2019 He does not have Cardiologist Not impacting his breathing or causing dyspnea. Admits chronic leg swelling.  BPH Possible Gross Hematuria episode Previously followed by Dr [facility] They were following PSA and bladder scan. Next apt due 06/2020 He currently does not endorse other urinary symptoms dysuria or frequency or urgency. However in 10/2019 he was ill with pneumonia had UTI as well required antibiotic  Additional complaints Left Knee Pain (Chronic) Left Hip Pain History of osteoarthritis, degenerative joint disease Previously on Tramadol, out of med now, using topical Voltaren Has seen orthopedic in past, limited options Knee injections before No new fall, using cane and wheelchair   Depression screen [facility] 2/9 03/02/2019 01/22/2019 09/21/2018  Decreased Interest 0 0 0  Down, Depressed, Hopeless 0 0 0  PHQ - 2 Score 0 0 0    Social History   Tobacco Use  . Smoking status: Former Smoker    Packs/day: 1.00    Years: 12.00    Pack years: 12.00    Types: Cigarettes    Quit date: 1968    Years since quitting: 54.3  .  Smokeless tobacco: Never Used  Vaping Use  . Vaping Use: Never used  Substance Use Topics  . Alcohol use: No  . Drug use: No    Review of Systems Per HPI unless specifically indicated above     Objective:    BP 114/68 (BP Location: Left Arm, Patient Position: Sitting, Cuff Size: Normal)   Pulse 72   Temp (!) 97.5 F (36.4 C) (Temporal)   Resp 17   Ht 6' (1.829 m)   SpO2 97%   BMI 30.92 kg/m   Wt Readings from Last 3 Encounters:  02/15/20 228 lb (103.4 kg)  02/08/20 228 lb (103.4 kg)  01/03/20 225 lb (102.1 kg)    Physical Exam Vitals and nursing note reviewed.  Constitutional:      General: He is not in acute distress.    Appearance: He is well-developed. He is obese. He is not diaphoretic.     Comments: Well-appearing, comfortable, cooperative  HENT:     Head: Normocephalic and atraumatic.  Eyes:     General:        Right eye: No discharge.        Left eye: No discharge.     Conjunctiva/sclera: Conjunctivae normal.  Neck:     Thyroid: No thyromegaly.  Cardiovascular:     Rate and Rhythm: Normal rate and regular rhythm.     Heart sounds: Normal heart sounds. No murmur heard.  Pulmonary:     Effort: Pulmonary effort is normal. No respiratory distress.     Breath sounds: Normal breath sounds. No wheezing or rales.  Musculoskeletal:     Cervical back: Normal range of motion and neck supple.     Right lower leg: Edema present.     Left lower leg: Edema present.     Comments: Left knee bulky, some effusion, limited ROM.  Able to stand with assistance, use cane and cautiously ambulate  Lymphadenopathy:     Cervical: No cervical adenopathy.  Skin:    General: Skin is warm and dry.     Findings: No erythema or rash.  Neurological:     Mental Status: He is alert and oriented to person, place, and time.  Psychiatric:        Behavior: Behavior normal.     Comments: Well groomed, good eye contact, normal speech and thoughts      I have personally reviewed  the radiology report from 10/31/17 Knee X-ray.  CLINICAL DATA:  Acute on chronic left knee pain.  EXAM: LEFT KNEE - COMPLETE 4+ VIEW; BILATERAL KNEES STANDING - 1 VIEW  COMPARISON:  None.  FINDINGS: Both knees reveal subjective adequate mineralization. The weight-bearing AP views of both knees reveal preservation of the medial and lateral joint spaces on the right. On the left however there is moderate lateral compartment joint space loss. There is beaking of the medial tibial spine on the left. There is minimal chondrocalcinosis of the menisci. Spurs arise from the articular margins of the patella. There are enthesophytes at the insertion of the quadriceps tendon and origin of the patellar tendon. There is prominence of the tibial tuberosity. There is no joint effusion. There is no acute fracture.  IMPRESSION: Moderate osteoarthritic changes of the lateral and patellofemoral compartments of the left knee with milder changes of the medial compartment. No acute fracture nor dislocation.   Electronically Signed   By: David  Martinique M.D.   On: 11/03/2017 07:40   EKG - performed in office today  Date: 05/05/20  Rate: 77  Rhythm: normal sinus rhythm  QRS Axis: left  Intervals: normal  ST/T Wave abnormalities: nonspecific ST changes  Conduction Disutrbances:left anterior fascicular block  Old EKG Reviewed: unchanged from EKG ED 10/07/19    Results for orders placed or performed in visit on 05/05/20  POCT Urinalysis Dipstick  Result Value Ref Range   Color, UA dark orange    Clarity, UA clear    Glucose, UA Negative Negative   Bilirubin, UA negative    Ketones, UA negative    Spec Grav, UA 1.010 1.010 - 1.025   Blood, UA negative    pH, UA 5.0 5.0 - 8.0   Protein, UA Positive (A) Negative   Urobilinogen, UA 0.2 0.2 or 1.0 E.U./dL   Nitrite, UA negative    Leukocytes, UA Large (3+) (A) Negative   Appearance dark    Odor foul       Assessment & Plan:    Problem List Items Addressed This Visit    Osteoarthritis of multiple joints   Relevant Medications   traMADol (ULTRAM) 50 MG tablet   Knee pain, chronic   Relevant Medications   traMADol (ULTRAM) 50 MG tablet   Essential hypertension   Relevant Orders   Ambulatory referral to Cardiology   Chronic venous insufficiency   BPH with urinary obstruction   Bilateral lower extremity edema    Other Visit Diagnoses    Gross hematuria    -  Primary   Relevant Medications   ciprofloxacin (CIPRO) 500 MG tablet   Other Relevant Orders   POCT Urinalysis Dipstick (Completed)   Urine Culture   Left chest pressure       Relevant Orders   Ambulatory referral to Cardiology   Left hip pain       Relevant Medications   traMADol (ULTRAM) 50 MG tablet      #Gross Hematuria X 1 episode, no recurrent No symptoms of UTI currently Prior complicated history with pneumonia and UTI UA shows leuks and blood Will treat empirically Cipro 500 BID x 7 days, pending urine culture Already has apt with Dr Diamantina Providence yearly for Urology [facility] 06/2020, they can go earlier if recurrence or keep that apt if does not recur.  #L Chest Pressure, intermittent None actively currently in office EKG done, see above, unchanged from prior Concern with chronic history obesity, HTN, lower ext edema, has cardiovascular risk factors Not impacting his breathing He is more sedentary, can have deconditioning as well. Today hemodynamically stable and well appearing, we agree to do outpatient management with referral to Cardiology for follow-up. EKG done here, and prompt f/u return criteria given If acute change or worsening seek care immediately at hospital ED if indicated  #Osteoarthritis, L Knee Pain / L Hip Pain Chronic problem for years L knee gradual worsening OA/DJD Known problem last X-ray 2019, advanced problem. No hip x-rays Re order Tramadol, 1-2 times daily PRN, use with caution, take Topical Voltaren PRn as  well Likely impacting L hip Offer return to orthopedic if need. Defer X-rays today   Orders Placed This Encounter  Procedures  . Urine Culture  . Ambulatory referral to Cardiology    Referral Priority:   Routine    Referral Type:   Consultation    Referral Reason:   Specialty Services Required    Requested Specialty:   Cardiology    Number of Visits Requested:   1  . POCT Urinalysis Dipstick     Meds ordered this encounter  Medications  . ciprofloxacin (CIPRO) 500 MG tablet    Sig: Take 1 tablet (500 mg total) by mouth 2 (two) times daily.    Dispense:  14 tablet    Refill:  0  . traMADol (ULTRAM) 50 MG tablet    Sig: Take 1 tablet (50 mg total) by mouth every 8 (eight) hours as needed.    Dispense:  30 tablet    Refill:  2      Follow up plan: Return if symptoms worsen or fail to improve.   [name], [facility] 05/05/2020, 11:56 AM

## 2020-05-05 NOTE — Patient Instructions (Addendum)
Thank you for coming to the office today.  Referral to Cardiology  [facility]) [facility] at [place], [place] [address] Main: [phone]   Stay tuned for apt.  EKG looks unchanged from Sept Oct 2021. There are signs of enlarged heart and some electrical abnormality. No acute heart attack seen on EKG.  We will send urine for a culture to determine if infected or not  Sent Cipro antibiotic 7 days  Please keep upcoming apt in June 06/29/20 w/ with Dr Diamantina Providence or can schedule sooner if more repeat episodes of bleeding occur.  Knee Arthritis likely impacting the Left hip. Often joint above and below are affected may have arthritis as well or can just be in pain due to limitation of the knee.  Options are medications, continue the Tramadol as needed, we can consider repeat injections cortisone in knee, but may not have many options for hip. OR we recommend orthopedic consultation for 2nd opinion.  Ordered Tramadol today  Please schedule a Follow-up Appointment to: Return if symptoms worsen or fail to improve.  If you have any other questions or concerns, please feel free to call the office or send a message through [place]. You may also schedule an earlier appointment if necessary.  Additionally, you may be receiving a survey about your experience at our office within a few days to 1 week by e-mail or mail. We value your feedback.  [name], DO [place]

## 2020-05-05 NOTE — Telephone Encounter (Signed)
  Chronic Care Management   Outreach Note  05/05/2020 Name: Randy [name] MRN: [id number] DOB: 03-24-1934  Referred by: [name], DO Reason for referral : No chief complaint on file.   Patient scheduled for appointment with PCP this morning.   Speak with patient's daughter, [name]. [name] denies current medication questions/concerns on behalf of patient. Plans to take patient to appointment and bring blood pressure log. Will reschedule appointment with CM Pharmacist.   Follow Up Plan: Telephone appointment scheduled with CM Pharmacist for 5/13 at 8:30 am  [name], PharmD, [facility] [phone]

## 2020-05-07 LAB — URINE CULTURE
MICRO NUMBER:: 11833347
SPECIMEN QUALITY:: ADEQUATE

## 2020-05-09 ENCOUNTER — Emergency Department: Payer: Medicare Other

## 2020-05-09 ENCOUNTER — Other Ambulatory Visit: Payer: Self-pay

## 2020-05-09 ENCOUNTER — Emergency Department
Admission: EM | Admit: 2020-05-09 | Discharge: 2020-05-09 | Disposition: A | Discharge status: Home / Self Care | Payer: Medicare Other | Attending: Emergency Medicine | Admitting: Emergency Medicine

## 2020-05-09 DIAGNOSIS — R600 Localized edema: Secondary | ICD-10-CM | POA: Diagnosis not present

## 2020-05-09 DIAGNOSIS — F0390 Unspecified dementia without behavioral disturbance: Secondary | ICD-10-CM | POA: Insufficient documentation

## 2020-05-09 DIAGNOSIS — Z79899 Other long term (current) drug therapy: Secondary | ICD-10-CM | POA: Diagnosis not present

## 2020-05-09 DIAGNOSIS — R079 Chest pain, unspecified: Secondary | ICD-10-CM | POA: Insufficient documentation

## 2020-05-09 DIAGNOSIS — G3183 Dementia with Lewy bodies: Secondary | ICD-10-CM | POA: Insufficient documentation

## 2020-05-09 DIAGNOSIS — R2243 Localized swelling, mass and lump, lower limb, bilateral: Secondary | ICD-10-CM | POA: Insufficient documentation

## 2020-05-09 DIAGNOSIS — Z87891 Personal history of nicotine dependence: Secondary | ICD-10-CM | POA: Insufficient documentation

## 2020-05-09 DIAGNOSIS — Z8616 Personal history of COVID-19: Secondary | ICD-10-CM | POA: Diagnosis not present

## 2020-05-09 DIAGNOSIS — K219 Gastro-esophageal reflux disease without esophagitis: Secondary | ICD-10-CM | POA: Diagnosis not present

## 2020-05-09 DIAGNOSIS — I10 Essential (primary) hypertension: Secondary | ICD-10-CM | POA: Diagnosis not present

## 2020-05-09 LAB — COMPREHENSIVE METABOLIC PANEL
ALT: 5 U/L (ref 0–44)
AST: 16 U/L (ref 15–41)
Albumin: 3.3 g/dL — ABNORMAL LOW (ref 3.5–5.0)
Alkaline Phosphatase: 60 U/L (ref 38–126)
Anion gap: 8 (ref 5–15)
BUN: 28 mg/dL — ABNORMAL HIGH (ref 8–23)
CO2: 28 mmol/L (ref 22–32)
Calcium: 8.7 mg/dL — ABNORMAL LOW (ref 8.9–10.3)
Chloride: 102 mmol/L (ref 98–111)
Creatinine, Ser: 0.98 mg/dL (ref 0.61–1.24)
GFR, Estimated: >60 mL/min (ref >60)
Glucose, Bld: 116 mg/dL — ABNORMAL HIGH (ref 70–99)
Potassium: 3.5 mmol/L (ref 3.5–5.1)
Sodium: 138 mmol/L (ref 135–145)
Total Bilirubin: 1.2 mg/dL (ref 0.3–1.2)
Total Protein: 6.5 g/dL (ref 6.5–8.1)

## 2020-05-09 LAB — CBC
HCT: 35.5 % — ABNORMAL LOW (ref 39.0–52.0)
Hemoglobin: 12.1 g/dL — ABNORMAL LOW (ref 13.0–17.0)
MCH: 31 pg (ref 26.0–34.0)
MCHC: 34.1 g/dL (ref 30.0–36.0)
MCV: 91 fL (ref 80.0–100.0)
Platelets: 192 10*3/uL (ref 150–400)
RBC: 3.9 MIL/uL — ABNORMAL LOW (ref 4.22–5.81)
RDW: 15 % (ref 11.5–15.5)
WBC: 6.8 10*3/uL (ref 4.0–10.5)
nRBC: 0 % (ref 0.0–0.2)

## 2020-05-09 LAB — TROPONIN I (HIGH SENSITIVITY)
Troponin I (High Sensitivity): 6 ng/L (ref <18)
Troponin I (High Sensitivity): 6 ng/L (ref <18)

## 2020-05-09 LAB — BRAIN NATRIURETIC PEPTIDE: B Natriuretic Peptide: 211.3 pg/mL — ABNORMAL HIGH (ref 0.0–100.0)

## 2020-05-09 NOTE — ED Provider Notes (Signed)
[facility] Emergency Department Provider Note  Time seen: 1:20 PM  I have reviewed the triage vital signs and the nursing notes.   HISTORY  Chief Complaint Chest pain  HPI Randy [name] is a 86 y.o. male with a past medical history of gastric reflux, hypertension, hyperlipidemia, dementia, presents to the emergency department for chest pain.  According to report and the patient he has been experiencing chest pain since earlier this morning he states around 8 or 9 AM.  States the chest pain occurs with movement.  Patient denies any nausea shortness of breath or diaphoresis but the patient does have baseline dementia.  Has bilateral lower extremity edema but states this is chronic.  Currently the patient appears well, no distress, reassuring vital signs.   Past Medical History:  Diagnosis Date  . Arthritis   . GERD (gastroesophageal reflux disease)   . Hyperlipidemia   . Hypertension   . Lewy body dementia ([place])   . Osteoporosis   . Prostate enlargement     Patient Active Problem List   Diagnosis Date Noted  . Bilateral primary osteoarthritis of knee 02/15/2020  . Pressure injury of buttock, stage 1   . Delirium   . COVID-19 virus infection 10/08/2019  . Unable to care for self 10/08/2019  . Caregiver unable to cope 10/08/2019  . Recurrent falls 10/08/2019  . Lewy body dementia without behavioral disturbance ([place])   . Prostate cancer ([place]) 06/30/2019  . BPH with urinary obstruction 11/06/2018  . Acute cholecystitis 09/08/2018  . Myalgia due to statin 02/24/2018  . Tricompartment osteoarthritis of left knee 11/14/2017  . Generalized weakness 04/28/2017  . Chronic low back pain 03/12/2017  . Knee pain, chronic 10/01/2016  . Chronic venous insufficiency 08/30/2016  . BPH without obstruction/lower urinary tract symptoms 03/06/2016  . Osteoarthritis of multiple joints 03/06/2016  . Degenerative joint disease (DJD) of lumbar spine 03/06/2016  . Essential  hypertension 03/05/2016  . Hyperlipidemia 03/05/2016  . Osteoporosis 03/05/2016  . GERD (gastroesophageal reflux disease) 03/05/2016  . Bilateral lower extremity edema 03/05/2016    Past Surgical History:  Procedure Laterality Date  . CHOLECYSTECTOMY N/A 09/10/2018   Procedure: LAPAROSCOPIC CHOLECYSTECTOMY;  Surgeon: Benjamine Sprague, DO;  Location: [facility] ORS;  Service: General;  Laterality: N/A;  . GANGLION CYST EXCISION Left 2013   Left upper arm  . HERNIA REPAIR N/A 2008   [place], ventral acquired hernia repair  . HOLEP-LASER ENUCLEATION OF THE PROSTATE WITH MORCELLATION N/A 11/06/2018   Procedure: HOLEP-LASER ENUCLEATION OF THE PROSTATE WITH MORCELLATION;  Surgeon: [name], MD;  Location: [facility] ORS;  Service: Urology;  Laterality: N/A;    Prior to Admission medications   Medication Sig Start Date End Date Taking? Authorizing Provider  carbidopa-levodopa (SINEMET IR) 25-100 MG tablet Take by mouth. Take 2 tablets by mouth 4 (four) times daily 02/16/20 02/16/21  [provider]  cholecalciferol (VITAMIN D) 1000 units tablet Take 1,000 Units by mouth daily.    [provider]  ciprofloxacin (CIPRO) 500 MG tablet Take 1 tablet (500 mg total) by mouth 2 (two) times daily. 05/05/20   Karamalegos, Devonne Doughty, DO  Coenzyme Q10 (CO Q 10) 100 MG CAPS Take 100 mg by mouth daily.     [provider]  diclofenac Sodium (VOLTAREN) 1 % GEL Apply 2 g topically 4 (four) times daily as needed. To knee arthritis pain 07/19/19   [name], DO  docusate sodium (COLACE) 100 MG capsule Take 1 capsule (  100 mg total) by mouth 2 (two) times daily. 10/12/19   [name], MD  enalapril (VASOTEC) 10 MG tablet Take 1 tablet (10 mg total) by mouth daily. Patient taking differently: Take 10 mg by mouth 2 (two) times daily. 01/26/20   Karamalegos, Devonne Doughty, DO  fluticasone (FLONASE) 50 MCG/ACT nasal spray Place 2 sprays into both nostrils daily as needed for allergies.      [provider]  furosemide (LASIX) 20 MG tablet Take 1 tablet (20 mg total) by mouth every other day. 06/17/19   Karamalegos, Alexander J, DO  melatonin 5 MG TABS Take 10 mg by mouth at bedtime as needed (sleep).    [provider]  Multiple Vitamin (MULTIVITAMIN WITH MINERALS) TABS tablet Take 1 tablet by mouth daily. 10/12/19   [name], MD  mupirocin ointment (BACTROBAN) 2 % Apply topically 2 (two) times daily. 02/08/20   [name], NP  OLANZapine (ZYPREXA) 5 MG tablet TAKE 1 TABLET (5 MG TOTAL) BY MOUTH AT BEDTIME AS NEEDED (AGITATION). 12/06/19   Karamalegos, Devonne Doughty, DO  Omega-3 Fatty Acids (FISH OIL) 1200 MG CAPS Take 1,200 mg by mouth 2 (two) times daily.     [provider]  omeprazole (PRILOSEC) 20 MG capsule Take 20 mg by mouth daily.    [provider]  rivastigmine (EXELON) 1.5 MG capsule Take 1 capsule by mouth 2 (two) times daily with a meal. 12/16/19 12/15/20  [provider]  traMADol (ULTRAM) 50 MG tablet Take 1 tablet (50 mg total) by mouth every 8 (eight) hours as needed. 05/05/20   Karamalegos, Devonne Doughty, DO  vitamin C (ASCORBIC ACID) 500 MG tablet Take 500 mg by mouth daily.    [provider]  zinc sulfate 220 (50 Zn) MG capsule Take 1 capsule (220 mg total) by mouth daily. 10/12/19   [name], MD    Allergies  Allergen Reactions  . Antihistamines, Chlorpheniramine-Type Other (See Comments)    Can't urinate    Family History  Problem Relation Age of Onset  . Breast cancer Mother   . Lung cancer Father   . Heart disease Brother   . Leukemia Brother   . Prostate cancer Neg Hx   . Colon cancer Neg Hx   . Bladder Cancer Neg Hx   . Kidney cancer Neg Hx     Social History Social History   Tobacco Use  . Smoking status: Former Smoker    Packs/day: 1.00    Years: 12.00    Pack years: 12.00    Types: Cigarettes    Quit date: 1968    Years since quitting: 54.3  . Smokeless tobacco:  Never Used  Vaping Use  . Vaping Use: Never used  Substance Use Topics  . Alcohol use: No  . Drug use: No    Review of Systems Constitutional: Negative for fever. Cardiovascular: Positive for chest pain this morning, none currently. Respiratory: Negative for shortness of breath. Gastrointestinal: Negative for abdominal pain Musculoskeletal: Negative for musculoskeletal complaints Neurological: Negative for headache All other ROS negative, although possibly limited by baseline dementia.  ____________________________________________   PHYSICAL EXAM:  VITAL SIGNS: ED Triage Vitals  Enc Vitals Group     BP 05/09/20 1315 137/73     Pulse Rate 05/09/20 1315 77     Resp 05/09/20 1315 20     Temp 05/09/20 1315 97.9 F (36.6 C)     Temp Source 05/09/20 1315 Oral     SpO2 05/09/20 1315  99 %     Weight --      Height --      Head Circumference --      Peak Flow --      Pain Score 05/09/20 1316 0     Pain Loc --      Pain Edu? --      Excl. in [place]? --    Constitutional: Alert and oriented. Well appearing and in no distress. Eyes: Normal exam ENT      Head: Normocephalic and atraumatic.      Mouth/Throat: Mucous membranes are moist. Cardiovascular: Normal rate, regular rhythm. Respiratory: Normal respiratory effort without tachypnea nor retractions. Breath sounds are clear  Gastrointestinal: Soft and nontender. No distention. Musculoskeletal: Nontender with normal range of motion in all extremities.  2+ lower extreme edema equal bilaterally.  Patient states this is chronic. Neurologic:  Normal speech and language. No gross focal neurologic deficits Skin:  Skin is warm, dry and intact.  Psychiatric: Mood and affect are normal.  ____________________________________________    EKG  EKG viewed and interpreted by myself shows a normal sinus rhythm at 78 bpm with a slightly widened QRS, left axis deviation, largely normal intervals with no concerning ST  changes.  ____________________________________________    RADIOLOGY  Chest x-ray is negative for acute abnormality.  ____________________________________________   INITIAL IMPRESSION / ASSESSMENT AND PLAN / ED COURSE  Pertinent labs & imaging results that were available during my care of the patient were reviewed by me and considered in my medical decision making (see chart for details).   Patient presents emergency department for chest pain reportedly worse with movement.  Currently the patient appears well denies any chest pain.  Patient does have CHF with 2+ lower extremity edema but states this is chronic.  We will check labs, troponin, BNP, chest x-ray and an EKG.  We will continue to closely monitor while awaiting results.  Patient agreeable to plan of care.  Patient continues to appear well in the emergency department and remains chest pain-free.  Patient's work-up shows initial negative troponin.  Repeat troponin and chemistry are pending.  Chest x-ray is clear and EKG is reassuring.  If repeat troponin is negative and no significant chemistry findings I anticipate likely discharge home.  Patient care signed out to oncoming provider.  Randy [name] was evaluated in Emergency Department on 05/09/2020 for the symptoms described in the history of present illness. He was evaluated in the context of the global COVID-19 pandemic, which necessitated consideration that the patient might be at risk for infection with the SARS-CoV-2 virus that causes COVID-19. Institutional protocols and algorithms that pertain to the evaluation of patients at risk for COVID-19 are in a state of rapid change based on information released by regulatory bodies including the CDC and federal and state organizations. These policies and algorithms were followed during the patient's care in the ED.  ____________________________________________   FINAL CLINICAL IMPRESSION(S) / ED DIAGNOSES  Chest pain   Harvest Dark, MD 05/09/20 1510

## 2020-05-09 NOTE — ED Triage Notes (Signed)
BIB by EMS from home. 86y/o with substernal CP. Occurs with movement but non during palpation. 12lead OK by EMS.  Vitals WNL for EMS. increased swelling bilateral lower extremities.  124/70 80HR  97% on RA BGL 143 RX by EMS 20 LH.  Currently no pain.

## 2020-05-09 NOTE — ED Notes (Signed)
Patient assisted to standing position at bedside to use urinal. Once done, assisted back into bed.

## 2020-05-19 ENCOUNTER — Ambulatory Visit (INDEPENDENT_AMBULATORY_CARE_PROVIDER_SITE_OTHER): Payer: Medicare Other | Admitting: Pharmacist

## 2020-05-19 DIAGNOSIS — F0280 Dementia in other diseases classified elsewhere without behavioral disturbance: Secondary | ICD-10-CM

## 2020-05-19 DIAGNOSIS — G3183 Dementia with Lewy bodies: Secondary | ICD-10-CM

## 2020-05-19 NOTE — Chronic Care Management (AMB) (Signed)
Chronic Care Management Pharmacy Note  05/19/2020 Name:  Randy [name] MRN:  [id number] DOB:  [birth year]  Subjective: Randy [name] is an 86 y.o. year old male who is a primary patient of [name], DO.  The CCM team was consulted for assistance with disease management and care coordination needs.    Engaged with patient by telephone for follow up visit in response to provider referral for pharmacy case management and/or care coordination services.   Consent to Services:  The patient was given information about Chronic Care Management services, agreed to services, and gave verbal consent prior to initiation of services.  Please see initial visit note for detailed documentation.   Patient Care Team: [name], DO as PCP - General (Family Medicine) Greg Cutter, LCSW as Social Worker (Licensed Clinical Social Worker) [place], Virl Diamond, [place] as Pharmacist Vanita Ingles, RN as Case Manager  Recent office visits: Office Visit with PCP on 4/29 for hematuria  Hospital visits: ED Visit at [facility] on 5/3 for Chest pain  Objective:  Lab Results  Component Value Date   CREATININE 0.98 05/09/2020   CREATININE 0.83 10/12/2019   CREATININE 0.71 10/11/2019    Social History   Tobacco Use  Smoking Status Former Smoker  . Packs/day: 1.00  . Years: 12.00  . Pack years: 12.00  . Types: Cigarettes  . Quit date: 18  . Years since quitting: 54.4  Smokeless Tobacco Never Used   BP Readings from Last 3 Encounters:  05/09/20 (!) 139/91  05/05/20 114/68  02/15/20 (!) 156/85   Pulse Readings from Last 3 Encounters:  05/09/20 79  05/05/20 72  02/15/20 80   Wt Readings from Last 3 Encounters:  02/15/20 228 lb (103.4 kg)  02/08/20 228 lb (103.4 kg)  01/03/20 225 lb (102.1 kg)    Assessment: Review of patient past medical history, allergies, medications, health status, including review of consultants reports,  laboratory and other test data, was performed as part of comprehensive evaluation and provision of chronic care management services.   SDOH:  (Social Determinants of Health) assessments and interventions performed: none   CCM Care Plan  Allergies  Allergen Reactions  . Antihistamines, Chlorpheniramine-Type Other (See Comments)    Can't urinate    Medications Reviewed Today    Reviewed by [name], DO (Physician) on 05/05/20 at 1217  Med List Status: <None>  Medication Order Taking? Sig Documenting Provider Last Dose Status Informant  carbidopa-levodopa (SINEMET IR) 25-100 MG tablet [id number] Yes Take by mouth. Take 2 tablets by mouth 4 (four) times daily [provider] Taking Active   cholecalciferol (VITAMIN D) 1000 units tablet [id number] Yes Take 1,000 Units by mouth daily. [provider] Taking Active Other  Coenzyme Q10 (CO Q 10) 100 MG CAPS [id number] Yes Take 100 mg by mouth daily.  [provider] Taking Active Other  diclofenac Sodium (VOLTAREN) 1 % GEL [id number] Yes Apply 2 g topically 4 (four) times daily as needed. To knee arthritis pain [name], DO Taking Active Pharmacy Records  docusate sodium (COLACE) 100 MG capsule [id number] Yes Take 1 capsule (100 mg total) by mouth 2 (two) times daily. [name], MD Taking Active   enalapril (VASOTEC) 10 MG tablet [id number] Yes Take 1 tablet (10 mg total) by mouth daily.  Patient taking differently: Take 10 mg by mouth 2 (two) times daily.   [name], DO Taking Active   fluticasone [facility])  50 MCG/ACT nasal spray [id number] Yes Place 2 sprays into both nostrils daily as needed for allergies.  [provider] Taking Active Other           Med Note [name], [name]   Mon Oct 26, 2018  1:07 PM)    furosemide (LASIX) 20 MG tablet [id number] Yes Take 1 tablet (20 mg total) by mouth every other day. [name], DO Taking Active Pharmacy  Records           Med Note [facility]   Fri Dec 17, 2019  9:48 AM) Taking on Mondays, Wednesdays and Fridays  melatonin 5 MG TABS [id number] Yes Take 10 mg by mouth at bedtime as needed (sleep). [provider] Taking Active Other  Multiple Vitamin (MULTIVITAMIN WITH MINERALS) TABS tablet [id number] Yes Take 1 tablet by mouth daily. [name], MD Taking Active   mupirocin ointment (BACTROBAN) 2 % [id number] Yes Apply topically 2 (two) times daily. [name], NP Taking Active   OLANZapine (ZYPREXA) 5 MG tablet [id number] Yes TAKE 1 TABLET (5 MG TOTAL) BY MOUTH AT BEDTIME AS NEEDED (AGITATION). [name], DO Taking Active   Omega-3 Fatty Acids (FISH OIL) 1200 MG CAPS [id number] Yes Take 1,200 mg by mouth 2 (two) times daily.  [provider] Taking Active Other  omeprazole (PRILOSEC) 20 MG capsule [id number] Yes Take 20 mg by mouth daily. [provider] Taking Active Other  rivastigmine (EXELON) 1.5 MG capsule [id number] Yes Take 1 capsule by mouth 2 (two) times daily with a meal. [provider] Taking Active   vitamin C (ASCORBIC ACID) 500 MG tablet [id number] Yes Take 500 mg by mouth daily. [provider] Taking Active Other  zinc sulfate 220 (50 Zn) MG capsule [id number] Yes Take 1 capsule (220 mg total) by mouth daily. [name], MD Taking Active           Patient Active Problem List   Diagnosis Date Noted  . Bilateral primary osteoarthritis of knee 02/15/2020  . Pressure injury of buttock, stage 1   . Delirium   . COVID-19 virus infection 10/08/2019  . Unable to care for self 10/08/2019  . Caregiver unable to cope 10/08/2019  . Recurrent falls 10/08/2019  . Lewy body dementia without behavioral disturbance ([place])   . Prostate cancer ([place]) 06/30/2019  . BPH with urinary obstruction 11/06/2018  . Acute cholecystitis 09/08/2018  . Myalgia due to statin 02/24/2018  . Tricompartment osteoarthritis of left  knee 11/14/2017  . Generalized weakness 04/28/2017  . Chronic low back pain 03/12/2017  . Knee pain, chronic 10/01/2016  . Chronic venous insufficiency 08/30/2016  . BPH without obstruction/lower urinary tract symptoms 03/06/2016  . Osteoarthritis of multiple joints 03/06/2016  . Degenerative joint disease (DJD) of lumbar spine 03/06/2016  . Essential hypertension 03/05/2016  . Hyperlipidemia 03/05/2016  . Osteoporosis 03/05/2016  . GERD (gastroesophageal reflux disease) 03/05/2016  . Bilateral lower extremity edema 03/05/2016    Immunization History  Administered Date(s) Administered  . Fluad Quad(high Dose 65+) 11/07/2018, 11/10/2019  . Influenza, High Dose Seasonal PF 10/01/2016, 10/07/2017  . Pneumococcal Conjugate-13 09/11/2016  . Pneumococcal Polysaccharide-23 10/31/2017  . Tdap 03/05/2016    Conditions to be addressed/monitored: HTN, dementia  Care Plan : PharmD - Medication Managment  Updates made by Vella Raring, RPH-CPP since 05/19/2020 12:00 AM    Problem: Disease Progression     Long-Range Goal: Disease Progression Prevented or Minimized   Start Date: 01/19/2020  Expected  End Date: 04/18/2020  This Visit's Progress: On track  Recent Progress: On track  Priority: High  Note:   Current Barriers:  . Chronic Disease Management support, education, and care coordination needs related to hypertension, osteoarthritis, hyperlipidemia, insomnia and memory loss . Cognitive Deficits  Pharmacist Clinical Goal(s):  [name] Over the next 90 days, caregiver/patient will adhere to plan to optimize therapeutic regimen for hypertension as evidenced by report of adherence to recommended medication management changes through collaboration with PharmD and provider.   Interventions: . 1:1 collaboration with [name], DO regarding development and update of comprehensive plan of care as evidenced by provider attestation and co-signature . Inter-disciplinary care team  collaboration (see longitudinal plan of care) . Perform chart review o  Patient seen for Office Visit with PCP on 4/29 for hematuria. Urinalysis and urine culture completed. Provider advised patient: - Start Cipro 500 BID x 7 days - Referred to Cardiology for follow up of chest pain - Reordered tramadol Rx for prn use for osteoarthritis pain o Patient [facility] ED on 5/[facility] for Chest pain . Today daughter/caregiver reports patient completed course of Cipro and denies further urinary symptoms . Reports plans to follow up with Cardiologist as directed by PCP and per instructions from ED provider at upcoming appointment on 5/23 . Daughter asks about finding some additional care for parents, to help with fixing lunch with parents and with light housework, particularly as she feels like patient's memory is getting worse o Will place Care Guide referral/collaborate with CCM team.  Hypertension . Reports patient taking: o Enalapril 10 mg daily in the morning o Furosemide 20 mg on Mondays, Wednesdays and Fridays . Reports recent home BP readings have been "good", but does not have log with her this morning. States that she will send this record to [facility] Pharmacist. . Encourage caregiver to continue to monitor home blood pressure and keeping log of results to review during next telephone appointment . Have counseled daughter to call office sooner for reading outside of established parameters or for new or worsening symptoms  Medication Adherence  Note patient using weekly pillbox with slots for four times daily dosing  Reports patient conitnues to need assistance from family for remembering fourth dose of carbidopa/levodopa  Have discussed with caregiver additional strategies for using medication alarm as adherence aid   Reports that she and her mom manage patient's as needed medications, including tramadol  Patient Goals/Self-Care Activities . Over the next 90 days, caregiver/patient  will:  - take medications as prescribed - check blood pressure, document, and provide at future appointments  Follow Up Plan: Telephone follow up appointment with care management team member scheduled for: 07/03/2020 at 8:30 AM      Medication Assistance: None required.  Patient affirms current coverage meets needs.  Patient's preferred pharmacy is:  [facility], [address] [place] [address] Phone: [phone] Fax: [phone]  [id number] - [address] [place] [address] [address] Phone: [phone] Fax: [phone]  Uses pill box? Yes  Follow Up:  Caregiver agrees to Care Plan and Follow-up.  [name], PharmD, [place], CPP Clinical Pharmacist [facility] [phone]

## 2020-05-19 NOTE — Patient Instructions (Signed)
Visit Information  PATIENT GOALS: Goals Addressed            This Visit's Progress   . Pharmacy Goals       Please continue to monitor home blood pressure and keep log of results to share at next appointment   Feel free to call me with any questions or concerns. I look forward to our next call!   [name], PharmD, [place] [phone]       The patient verbalized understanding of instructions, educational materials, and care plan provided today and declined offer to receive copy of patient instructions, educational materials, and care plan.   Telephone follow up appointment with care management team member scheduled for: 07/03/2020 at 8:30 AM

## 2020-05-23 ENCOUNTER — Telehealth: Payer: Self-pay | Admitting: *Deleted

## 2020-05-23 NOTE — Telephone Encounter (Signed)
   Telephone encounter was:  Unsuccessful.  05/23/2020 Name: Randy [name] MRN: [id number] DOB: [birth year]  Unsuccessful outbound call made today to assist with:  Transportation Needs   Outreach Attempt:  1st Attempt  A HIPAA compliant voice message was left requesting a return call.  Instructed patient to call back at   Instructed patient to call back at [phone]  at their earliest convenience.   [place], Care Management  [phone] [address] , [place] [address] Email : [email or web address]

## 2020-05-24 ENCOUNTER — Telehealth: Payer: Self-pay

## 2020-05-25 ENCOUNTER — Ambulatory Visit: Payer: Self-pay | Admitting: General Practice

## 2020-05-25 ENCOUNTER — Other Ambulatory Visit: Payer: Self-pay | Admitting: Family Medicine

## 2020-05-25 ENCOUNTER — Telehealth: Payer: Self-pay | Admitting: General Practice

## 2020-05-25 DIAGNOSIS — G3183 Dementia with Lewy bodies: Secondary | ICD-10-CM | POA: Diagnosis not present

## 2020-05-25 DIAGNOSIS — M47816 Spondylosis without myelopathy or radiculopathy, lumbar region: Secondary | ICD-10-CM

## 2020-05-25 DIAGNOSIS — R413 Other amnesia: Secondary | ICD-10-CM

## 2020-05-25 DIAGNOSIS — I10 Essential (primary) hypertension: Secondary | ICD-10-CM

## 2020-05-25 DIAGNOSIS — F0280 Dementia in other diseases classified elsewhere without behavioral disturbance: Secondary | ICD-10-CM

## 2020-05-25 DIAGNOSIS — E782 Mixed hyperlipidemia: Secondary | ICD-10-CM

## 2020-05-25 NOTE — Chronic Care Management (AMB) (Signed)
Chronic Care Management   CCM RN Visit Note  05/25/2020 Name: Randy [name] MRN: [id number] DOB: 28-Jan-1934  Subjective: Randy [name] is a 86 y.o. year old male who is a primary care patient of [name], DO. The care management team was consulted for assistance with disease management and care coordination needs.    Engaged with patient by telephone for follow up visit in response to provider referral for case management and/or care coordination services.   Consent to Services:  The patient was given information about Chronic Care Management services, agreed to services, and gave verbal consent prior to initiation of services.  Please see initial visit note for detailed documentation.   Patient agreed to services and verbal consent obtained.   Assessment: Review of patient past medical history, allergies, medications, health status, including review of consultants reports, laboratory and other test data, was performed as part of comprehensive evaluation and provision of chronic care management services.   SDOH (Social Determinants of Health) assessments and interventions performed:    CCM Care Plan  Allergies  Allergen Reactions  . Antihistamines, Chlorpheniramine-Type Other (See Comments)    Can't urinate    Outpatient Encounter Medications as of 05/25/2020  Medication Sig Note  . carbidopa-levodopa (SINEMET IR) 25-100 MG tablet Take by mouth. Take 2 tablets by mouth 4 (four) times daily   . cholecalciferol (VITAMIN D) 1000 units tablet Take 1,000 Units by mouth daily.   . Coenzyme Q10 (CO Q 10) 100 MG CAPS Take 100 mg by mouth daily.    . diclofenac Sodium (VOLTAREN) 1 % GEL Apply 2 g topically 4 (four) times daily as needed. To knee arthritis pain   . docusate sodium (COLACE) 100 MG capsule Take 1 capsule (100 mg total) by mouth 2 (two) times daily.   . enalapril (VASOTEC) 10 MG tablet Take 1 tablet (10 mg total) by mouth daily.   . fluticasone (FLONASE) 50  MCG/ACT nasal spray Place 2 sprays into both nostrils daily as needed for allergies.    . furosemide (LASIX) 20 MG tablet Take 1 tablet (20 mg total) by mouth every other day. 12/17/2019: Taking on Mondays, Wednesdays and Fridays  . melatonin 5 MG TABS Take 10 mg by mouth at bedtime as needed (sleep).   . Multiple Vitamin (MULTIVITAMIN WITH MINERALS) TABS tablet Take 1 tablet by mouth daily.   . mupirocin ointment (BACTROBAN) 2 % Apply topically 2 (two) times daily.   [name] OLANZapine (ZYPREXA) 5 MG tablet TAKE 1 TABLET (5 MG TOTAL) BY MOUTH AT BEDTIME AS NEEDED (AGITATION). (Patient not taking: Reported on 05/19/2020)   . Omega-3 Fatty Acids (FISH OIL) 1200 MG CAPS Take 1,200 mg by mouth 2 (two) times daily.    [name] omeprazole (PRILOSEC) 20 MG capsule Take 20 mg by mouth daily.   . rivastigmine (EXELON) 1.5 MG capsule Take 1 capsule by mouth 2 (two) times daily with a meal.   . traMADol (ULTRAM) 50 MG tablet Take 1 tablet (50 mg total) by mouth every 8 (eight) hours as needed.   . vitamin C (ASCORBIC ACID) 500 MG tablet Take 500 mg by mouth daily.   [name] zinc sulfate 220 (50 Zn) MG capsule Take 1 capsule (220 mg total) by mouth daily.    No facility-administered encounter medications on file as of 05/25/2020.    Patient Active Problem List   Diagnosis Date Noted  . Bilateral primary osteoarthritis of knee 02/15/2020  . Pressure injury of buttock, stage 1   .  Delirium   . COVID-19 virus infection 10/08/2019  . Unable to care for self 10/08/2019  . Caregiver unable to cope 10/08/2019  . Recurrent falls 10/08/2019  . Lewy body dementia without behavioral disturbance ([place])   . Prostate cancer ([place]) 06/30/2019  . BPH with urinary obstruction 11/06/2018  . Acute cholecystitis 09/08/2018  . Myalgia due to statin 02/24/2018  . Tricompartment osteoarthritis of left knee 11/14/2017  . Generalized weakness 04/28/2017  . Chronic low back pain 03/12/2017  . Knee pain, chronic 10/01/2016  . Chronic venous  insufficiency 08/30/2016  . BPH without obstruction/lower urinary tract symptoms 03/06/2016  . Osteoarthritis of multiple joints 03/06/2016  . Degenerative joint disease (DJD) of lumbar spine 03/06/2016  . Essential hypertension 03/05/2016  . Hyperlipidemia 03/05/2016  . Osteoporosis 03/05/2016  . GERD (gastroesophageal reflux disease) 03/05/2016  . Bilateral lower extremity edema 03/05/2016    Conditions to be addressed/monitored:HTN, HLD and Dementia  Care Plan : RNCM: Dementia (Adult)  Updates made by Vanita Ingles since 05/25/2020 12:00 AM    Problem: RNCM: Management of Dementia and Memory Loss   Priority: Medium    Long-Range Goal: RNCM: Management of Dementia and Memory Loss   Priority: Medium  Note:   Current Barriers:  [name] Knowledge Deficits related to resources available for patient with dementia and memory loss  . Lacks caregiver support.  . Cognitive Deficits . Unable to independently manage changes related to dementia and memory loss  . Unable to self administer medications as prescribed . Does not adhere to prescribed medication regimen . Lacks social connections . Unable to perform ADLs independently . Unable to perform IADLs independently . Does not contact provider office for questions/concerns  Nurse Case Manager Clinical Goal(s):  [name] Over the next 120 days, patient will verbalize understanding of plan for effective management of dementia and memory loss  . Over the next 120 days, patient will attend all scheduled medical appointments: no upcoming appointments but the patients daughter knows to call for changes or needs  . Over the next 120 days, patient will demonstrate improved adherence to prescribed treatment plan for dementia and memory loss as evidenced bypatient being as independent as possible and working with CCM team to optimize health and well being related to dementia and memory loss.   Interventions:  . 1:1 collaboration with [name], DO regarding development and update of comprehensive plan of care as evidenced by provider attestation and co-signature . Inter-disciplinary care team collaboration (see longitudinal plan of care) . Evaluation of current treatment plan related to dementia and memoryt loss  and patient's adherence to plan as established by provider. 03-16-2020: The patients is about the same. The patients daughter states other than a little  complaint of dizziness the patient is doing well. Denies any safety concerns. 05-25-2020: The patients daughter states that she feels the patients dementia is worse. She states he is taking his medications but sometimes it may be at different times that he takes it. She notices that his mobility is different than what it has been and he seems to be walking "stiffer". Denies any falls. The daughter was at home with the patient today.  . Advised patient to call the office for changes in condition or questions  . Provided education to patient re: caregiver burn out and support available. 05-25-2020: The patients daughter is interested in talking to the care guides about help with her parents in the home. Discussed options and gave the information for [facility] to  call Jeanine the care guide and provided her number.  . Discussed plans with patient for ongoing care management follow up and provided patient with direct contact information for care management team . Provided patient with dementia and memory loss  educational materials related to changes in memory. The daughter states that the patient seems to be in a "better mood" right now. 03-16-2020: Denies any changes in memory at this time. About the same. 05-25-2020: The patients memory is declining per the daughter. Talked to daughter about palliative services and she is open to talking to the provider about palliative services. Will collaborate with the pcp about palliative services and see what the recommendations are.  . Discussed the OTC  benefits through [facility] and the ability to get the patient a phillips life line system for the patient to use. The daughter does not feel this is needed as her mother is with the patient at all times.   Patient Goals/Self-Care Activities Over the next 120 days, patient will:  - Patient will self administer medications as prescribed Patient will attend all scheduled provider appointments Patient will call pharmacy for medication refills Patient will attend church or other social activities Patient will continue to perform ADL's independently Patient will call provider office for new concerns or questions Patient will work with BSW to address care coordination needs and will continue to work with the clinical team to address health care and disease management related needs.   - action plan for worsening symptoms mutually developed - community resource information provided - pain assessed - pain management plan developed - symptom review completed  Follow Up Plan: Telephone follow up appointment with care management team member scheduled for: 07-27-2020 at 58 am       Care Plan : RNCM: Hypertension (Adult)  Updates made by Vanita Ingles since 05/25/2020 12:00 AM    Problem: RNCM: Hypertension (Hypertension)   Priority: Medium    Long-Range Goal: RNCM: Hypertension Monitored   Priority: Medium  Note:   Objective:  . Last practice recorded BP readings:  . BP Readings from Last 3 Encounters: .  05/09/20 . (!) 139/91 .  05/05/20 . 114/68 .  02/15/20 . (!) 156/85 .     [name] Most recent eGFR/CrCl: No results found for: EGFR  No components found for: CRCL Current Barriers:  [name] Knowledge Deficits related to basic understanding of hypertension pathophysiology and self care management . Knowledge Deficits related to understanding of medications prescribed for management of hypertension . Cognitive Deficits . Limited Social Support . Unable to independently manage HTN . Unable to self  administer medications as prescribed . Does not adhere to prescribed medication regimen . Lacks social connections . Unable to perform ADLs independently . Unable to perform IADLs independently . Does not contact provider office for questions/concerns Case Manager Clinical Goal(s):  [name] Over the next 120 days, patient will verbalize understanding of plan for hypertension management . Over the next 120 days, patient will demonstrate improved adherence to prescribed treatment plan for hypertension as evidenced by taking all medications as prescribed, monitoring and recording blood pressure as directed, adhering to low sodium/DASH diet . Over the next 120 days, patient will demonstrate improved health management independence as evidenced by checking blood pressure as directed and notifying PCP if SBP>160 or DBP > 90, taking all medications as prescribe, and adhering to a low sodium diet as discussed. Interventions:  . Collaboration with [name], DO regarding development and update of comprehensive plan of care  as evidenced by provider attestation and co-signature . Inter-disciplinary care team collaboration (see longitudinal plan of care) . UNABLE to independently:manage HTN . Evaluation of current treatment plan related to hypertension self management and patient's adherence to plan as established by provider. 03-16-2020: The patients daughter is working closely with the pharm D and RNCM to manage blood pressure. The daughter states the patient still has edema and swelling but it is not worse than usual. Will continue to monitor. 05-25-2020: The patients daughter provided several readings for the [facility].  The patients systolic is running 716 to 967 and the diastolic is running 71 to 102. Heart rate is running 69 to 102.  The patients daughter states he is not complaining of headaches or any other issues related to hight blood pressure.  . Provided education to patient re: stroke prevention,  s/s of heart attack and stroke, DASH diet, complications of uncontrolled blood pressure . Reviewed medications with patient and discussed importance of compliance. 05-25-2020: The patient is compliant with medications regimen,, admits he may take at different times but he is taking is medications, also working with the pharm D on a regular basis. Denies any concerns with medication management or cost.  . Discussed plans with patient for ongoing care management follow up and provided patient with direct contact information for care management team . Advised patient, providing education and rationale, to monitor blood pressure daily and record, calling PCP for findings outside established parameters.  Patient Goals/Self-Care Activities . Over the next 120 days, patient will:  - Self administers medications as prescribed Calls provider office for new concerns, questions, or BP outside discussed parameters Checks BP and records as discussed Follows a low sodium diet/DASH diet - blood pressure trends reviewed: 01-20-2020: 158/100 pulse 62, 01-21-2020 132/91, pulse 86; 01-23-2020 145/97, pulse 80.  The patient is also working with the CCM pharmacist to help with management of blood pressure medications. 05-25-2020: 05/09/2020 146/71 p-81, 05-10-2020: 142/100, p-72, 05-17-2020 150/97, p-73 - depression screen reviewed - home or ambulatory blood pressure monitoring encouraged Follow Up Plan: Telephone follow up appointment with care management team member scheduled for: 07-27-2020 at 28 am   Care Plan : RNCM: HLD management  Updates made by Vanita Ingles since 05/25/2020 12:00 AM    Problem: RNCM: HLD management   Priority: Medium    Long-Range Goal: RNCM: HLD Management   Priority: Medium  Note:   Current Barriers:  . Poorly controlled hyperlipidemia, complicated by memory loss, dementia, elevated blood pressures  . Current antihyperlipidemic regimen: Omega 3 1200 mg daily  . Most recent lipid panel:   . Lab Results .  Component . Value . Date .   [name] CHOL . 176 . 06/10/2018 .   [name] HDL . 35 (L) . 06/10/2018 .   [name] [place] . 121 (H) . 06/10/2018 .   [name] TRIG . 98 . 06/10/2018 .   [name] CHOLHDL . 5.0 (H) . 06/10/2018 .    [name] ASCVD risk enhancing conditions: age >68, HTN,  former smoker . Unable to independently manage HLD  . Lacks social connections . Does not contact provider office for questions/concerns  RN Care Manager Clinical Goal(s):  [name] Over the next 120 days, patient will work with Consulting civil engineer, providers, and care team towards execution of optimized self-health management plan . Over the next 120 days, patient will verbalize understanding of plan for effective management of HLD  . Over the next 120 days, patient will work with [facility], CCM  team and pcp to address needs related to management of HLD   Interventions: . Collaboration with [name], DO regarding development and update of comprehensive plan of care as evidenced by provider attestation and co-signature . Inter-disciplinary care team collaboration (see longitudinal plan of care) . Medication review performed; medication list updated in electronic medical record.  Bertram Savin care team collaboration (see longitudinal plan of care) . Referred to pharmacy team for assistance with HLD medication management . Evaluation of current treatment plan related to HLD and patient's adherence to plan as established by provider. 05-25-2020: The patient saw pcp 05-05-2020. The patient is overall doing about the same but has worsening memory issues. The patients daughter is with the patient a lot but is wanting additional help. Referrals are in place and will reach out to LCSW for assistance.  . Advised patient to call the office with changes or new questions  . Provided education to patient re: heart healthy diet. 05-25-2020: The patient is eating well and the patients daughter tries to make sure he watches sodium and  fats. . Discussed plans with patient for ongoing care management follow up and provided patient with direct contact information for care management team   Patient Goals/Self-Care Activities: . Over the next 120 days, patient will:   - call for medicine refill 2 or 3 days before it runs out - call if I am sick and can't take my medicine - keep a list of all the medicines I take; vitamins and herbals too - learn to read medicine labels - use a pillbox to sort medicine - use an alarm clock or phone to remind me to take my medicine - drink 6 to 8 glasses of water each day - eat 3 to 5 servings of fruits and vegetables each day - eat 5 or 6 small meals each day - limit fast food meals to no more than 1 per week - manage portion size - prepare main meal at home 3 to 5 days each week - read food labels for fat, fiber, carbohydrates and portion size - be open to making changes - I can manage, know and watch for signs of a heart attack - if I have chest pain, call for help - learn about small changes that will make a big difference - learn my personal risk factors - education plan reviewed and/or amended - empathy and reassurance conveyed - family/caregiver participation in learning encouraged - health literacy screen reviewed - patient's preferred learning methods utilized - privacy ensured - questions encouraged - readiness to learn monitored   Follow Up Plan: Telephone follow up appointment with care management team member scheduled for:07-27-2020 at 13 am       Plan:Telephone follow up appointment with care management team member scheduled for:  07-27-2020 at 79 am  Noreene Larsson RN, MSN, [place] Mobile: [phone]

## 2020-05-25 NOTE — Patient Instructions (Signed)
Visit Information  PATIENT GOALS: Patient Care Plan: General Social Work (Adult)    Problem Identified: Caregiver Stress     Long-Range Goal: Caregiver Coping Optimized for Daughter   Start Date: 04/11/2020  Priority: Medium  Note:    Timeframe:  Long-Range Goal Priority:  Medium  Start Date:  04/11/20                         Expected End Date:  07/11/20                  Follow Up Date- 05/24/20  Current Barriers:  . Financial constraints . Level of care concerns . ADL IADL limitations . Social Isolation . Limited access to caregiver . Memory Deficits . Lacks knowledge of community resource: personal care service resources within the area that may benefit patient . Lack of socialization   Clinical Social Work Clinical Goal(s):  [name] Over the next 90 days, client will work with SW to address concerns related to lack of care/support within the home  Interventions: . Patient interviewed and appropriate assessments performed . Patient was informed that current CCM LCSW will be leaving position next month and his next CCM Social Work follow up visit will be with another LCSW. Patient was appreciative of support provided and receptive to news . LCSW spoke wit patient's daughter on 04/11/20 as LCSW was unable to reach patient. Patient is stable per daughter but is anxious about his mobility and is eager to gain a ramp through [facility]. Patient is on the wait list for another 2 months to get ramp installed. CCM LCSW provided family with [facility] [employer] information and their contact information in the case that they can set up his ramp sooner.  . Provided patient and family with information about personal care service resources within the area. Patient reports that he lives with his spouse and daughter and that his daughter and spouse are his main sources of support that assist him with transportation/meal prep/daily needs.  . Discussed plans with patient for ongoing care management follow  up and provided patient with direct contact information for care management team . Advised patient to contact CCM team with any urgent concerns . Assisted patient/caregiver with obtaining information about health plan benefits . Provided education and assistance to client regarding Advanced Directives. . Provided education to patient/caregiver regarding level of care options. [name] LCSW provided self-care education. Patient has ongoing issue with memory and sleep. Patient was encouraged to take medications as prescribed and to ask for help (daughter or spouse) when needed and to try to check blood pressure and record it once a day per PCP's recommendation. Family continue to encourage patient to keep up with blood pressure recordings. Family assist patient with taking medications daily as well.  . Patient has been having pain and discomfort in his right knee which has affected his mobility. PT has ended and daughter reports that there no use in making a new referral for services because patient is not able to make progress with their treatment goals. Per daughter, patient has been wearing his compression stockings. . Daughter/patient reports pt is still taking melatonin dose of 46m per night but still struggles with insomnia. . Daughter reports that she believes patient in on the wait list for [facility] [employer] [facility].  . Patient admits to feeling lonely at times due to isolation related to COVID-19. LCSW provided family with community resource education for socialization ([place]) Family wish for LCSW to mail this information to their address. LCSW sent out resources on 07/16/19. Emotional support and coping skill education provided as well. Patient was very receptive to this information. UPDATE- Family report that they contacted [facility] and was informed that they have closed again due to [place]. They are expecting to open up again in October and patient is agreeable to  attend center in order to gain socialization.  . Daughter reports ongoing swelling in patient's legs and feet. Patient is not taking his lasix as he has had a difficult time with making it to the bathroom in time. LCSW encouraged daughter to ensure that patient elevates his legs and wears his compression stockings.  [name] LCSW completed referral for [facility] to assist with ramp resources, [facility] and to place patient on the wait list for [facility] through [facility]. Family report that they are unable to pay out of pocket for an aide but that he needs this level of support. Family wish to gain a ramp (for both patient and spouse) at the front of their home in order to prevent future falls.  - Patient Self Care Activities:  . Attends all scheduled provider appointments . Calls provider office for new concerns or questions    Task: Recognize and Manage Caregiver Stress   Note:   Care Management Activities:    - active listening utilized - caregiver stress acknowledged - complementary therapy use encouraged - consideration of in-home help encouraged - decision-making supported - engagement with primary care provider encouraged - healthy lifestyle promoted - mental health treatment facilitated - participation in support group encouraged - positive reinforcement provided - self-reflection promoted - social and community activities encouraged - spiritual activity use promoted - support group information provided - verbalization of feelings encouraged    Notes:    Patient Care Plan: PharmD - Medication Managment    Problem Identified: Disease Progression     Long-Range Goal: Disease Progression Prevented or Minimized   Start Date: 01/19/2020  Expected End Date: 04/18/2020  This Visit's Progress: On track  Recent Progress: On track  Priority: High  Note:   Current Barriers:  . Chronic Disease Management support, education, and care coordination needs related to hypertension,  osteoarthritis, hyperlipidemia, insomnia and memory loss . Cognitive Deficits  Pharmacist Clinical Goal(s):  [name] Over the next 90 days, caregiver/patient will adhere to plan to optimize therapeutic regimen for hypertension as evidenced by report of adherence to recommended medication management changes through collaboration with PharmD and provider.   Interventions: . 1:1 collaboration with [name], DO regarding development and update of comprehensive plan of care as evidenced by provider attestation and co-signature . Inter-disciplinary care team collaboration (see longitudinal plan of care) . Perform chart review o  Patient seen for Office Visit with PCP on 4/29 for hematuria. Urinalysis and urine culture completed. Provider advised patient: - Start Cipro 500 BID x 7 days - Referred to Cardiology for follow up of chest pain - Reordered tramadol Rx for prn use for osteoarthritis pain o Patient seen at [facility] ED on 5/3 for Chest pain . Today daughter/caregiver reports patient completed course of Cipro and denies further urinary symptoms . Reports plans to follow up with Cardiologist as directed by PCP and per instructions from ED provider at upcoming appointment on 5/23 . Daughter asks about finding some additional care for parents, to help with fixing lunch with parents and with light housework, particularly  as she feels like patient's memory is getting worse o Will place Care Guide referral/collaborate with CCM team.  Hypertension . Reports patient taking: o Enalapril 10 mg daily in the morning o Furosemide 20 mg on Mondays, Wednesdays and Fridays . Reports recent home BP readings have been "good", but does not have log with her this morning. States that she will send this record to [facility] Pharmacist. . Encourage caregiver to continue to monitor home blood pressure and keeping log of results to review during next telephone appointment . Have counseled daughter to  call office sooner for reading outside of established parameters or for new or worsening symptoms  Medication Adherence  Note patient using weekly pillbox with slots for four times daily dosing  Reports patient conitnues to need assistance from family for remembering fourth dose of carbidopa/levodopa  Have discussed with caregiver additional strategies for using medication alarm as adherence aid   Reports that she and her mom manage patient's as needed medications, including tramadol  Patient Goals/Self-Care Activities . Over the next 90 days, caregiver/patient will:  - take medications as prescribed - check blood pressure, document, and provide at future appointments  Follow Up Plan: Telephone follow up appointment with care management team member scheduled for: 07/03/2020 at 8:30 AM    Patient Care Plan: RNCM: Dementia (Adult)    Problem Identified: RNCM: Management of Dementia and Memory Loss   Priority: Medium    Long-Range Goal: RNCM: Management of Dementia and Memory Loss   Priority: Medium  Note:   Current Barriers:  [name] Knowledge Deficits related to resources available for patient with dementia and memory loss  . Lacks caregiver support.  . Cognitive Deficits . Unable to independently manage changes related to dementia and memory loss  . Unable to self administer medications as prescribed . Does not adhere to prescribed medication regimen . Lacks social connections . Unable to perform ADLs independently . Unable to perform IADLs independently . Does not contact provider office for questions/concerns  Nurse Case Manager Clinical Goal(s):  [name] Over the next 120 days, patient will verbalize understanding of plan for effective management of dementia and memory loss  . Over the next 120 days, patient will attend all scheduled medical appointments: no upcoming appointments but the patients daughter knows to call for changes or needs  . Over the next 120 days, patient will  demonstrate improved adherence to prescribed treatment plan for dementia and memory loss as evidenced bypatient being as independent as possible and working with CCM team to optimize health and well being related to dementia and memory loss.   Interventions:  . 1:1 collaboration with [name], DO regarding development and update of comprehensive plan of care as evidenced by provider attestation and co-signature . Inter-disciplinary care team collaboration (see longitudinal plan of care) . Evaluation of current treatment plan related to dementia and memoryt loss  and patient's adherence to plan as established by provider. 03-16-2020: The patients is about the same. The patients daughter states other than a little  complaint of dizziness the patient is doing well. Denies any safety concerns. 05-25-2020: The patients daughter states that she feels the patients dementia is worse. She states he is taking his medications but sometimes it may be at different times that he takes it. She notices that his mobility is different than what it has been and he seems to be walking "stiffer". Denies any falls. The daughter was at home with the patient today.  . Advised patient to  call the office for changes in condition or questions  . Provided education to patient re: caregiver burn out and support available. 05-25-2020: The patients daughter is interested in talking to the care guides about help with her parents in the home. Discussed options and gave the information for [facility] to call Jeanine the care guide and provided her number.  . Discussed plans with patient for ongoing care management follow up and provided patient with direct contact information for care management team . Provided patient with dementia and memory loss  educational materials related to changes in memory. The daughter states that the patient seems to be in a "better mood" right now. 03-16-2020: Denies any changes in memory at this time.  About the same. 05-25-2020: The patients memory is declining per the daughter. Talked to daughter about palliative services and she is open to talking to the provider about palliative services. Will collaborate with the pcp about palliative services and see what the recommendations are.  . Discussed the OTC benefits through [facility] and the ability to get the patient a phillips life line system for the patient to use. The daughter does not feel this is needed as her mother is with the patient at all times.   Patient Goals/Self-Care Activities Over the next 120 days, patient will:  - Patient will self administer medications as prescribed Patient will attend all scheduled provider appointments Patient will call pharmacy for medication refills Patient will attend church or other social activities Patient will continue to perform ADL's independently Patient will call provider office for new concerns or questions Patient will work with BSW to address care coordination needs and will continue to work with the clinical team to address health care and disease management related needs.   - action plan for worsening symptoms mutually developed - community resource information provided - pain assessed - pain management plan developed - symptom review completed  Follow Up Plan: Telephone follow up appointment with care management team member scheduled for: 07-27-2020 at 25 am       Task: Develop Strategies to Manage Behavior   Note:   Care Management Activities:    - action plan for worsening symptoms mutually developed - community resource information provided - pain assessed - pain management plan developed - symptom review completed       Patient Care Plan: RNCM: Hypertension (Adult)    Problem Identified: RNCM: Hypertension (Hypertension)   Priority: Medium    Long-Range Goal: RNCM: Hypertension Monitored   Priority: Medium  Note:   Objective:  . Last practice recorded BP readings:   . BP Readings from Last 3 Encounters: .  05/09/20 . (!) 139/91 .  05/05/20 . 114/68 .  02/15/20 . (!) 156/85 .     [name] Most recent eGFR/CrCl: No results found for: EGFR  No components found for: CRCL Current Barriers:  [name] Knowledge Deficits related to basic understanding of hypertension pathophysiology and self care management . Knowledge Deficits related to understanding of medications prescribed for management of hypertension . Cognitive Deficits . Limited Social Support . Unable to independently manage HTN . Unable to self administer medications as prescribed . Does not adhere to prescribed medication regimen . Lacks social connections . Unable to perform ADLs independently . Unable to perform IADLs independently . Does not contact provider office for questions/concerns Case Manager Clinical Goal(s):  [name] Over the next 120 days, patient will verbalize understanding of plan for hypertension management . Over the next 120 days, patient will demonstrate improved  adherence to prescribed treatment plan for hypertension as evidenced by taking all medications as prescribed, monitoring and recording blood pressure as directed, adhering to low sodium/DASH diet . Over the next 120 days, patient will demonstrate improved health management independence as evidenced by checking blood pressure as directed and notifying PCP if SBP>160 or DBP > 90, taking all medications as prescribe, and adhering to a low sodium diet as discussed. Interventions:  . Collaboration with [name], DO regarding development and update of comprehensive plan of care as evidenced by provider attestation and co-signature . Inter-disciplinary care team collaboration (see longitudinal plan of care) . UNABLE to independently:manage HTN . Evaluation of current treatment plan related to hypertension self management and patient's adherence to plan as established by provider. 03-16-2020: The patients daughter is working  closely with the pharm D and RNCM to manage blood pressure. The daughter states the patient still has edema and swelling but it is not worse than usual. Will continue to monitor. 05-25-2020: The patients daughter provided several readings for the [facility]  The patients systolic is running 830 to 940 and the diastolic is running 71 to 102. Heart rate is running 69 to 102.  The patients daughter states he is not complaining of headaches or any other issues related to hight blood pressure.  . Provided education to patient re: stroke prevention, s/s of heart attack and stroke, DASH diet, complications of uncontrolled blood pressure . Reviewed medications with patient and discussed importance of compliance. 05-25-2020: The patient is compliant with medications regimen,, admits he may take at different times but he is taking is medications, also working with the pharm D on a regular basis. Denies any concerns with medication management or cost.  . Discussed plans with patient for ongoing care management follow up and provided patient with direct contact information for care management team . Advised patient, providing education and rationale, to monitor blood pressure daily and record, calling PCP for findings outside established parameters.  Patient Goals/Self-Care Activities . Over the next 120 days, patient will:  - Self administers medications as prescribed Calls provider office for new concerns, questions, or BP outside discussed parameters Checks BP and records as discussed Follows a low sodium diet/DASH diet - blood pressure trends reviewed: 01-20-2020: 158/100 pulse 62, 01-21-2020 132/91, pulse 86; 01-23-2020 145/97, pulse 80.  The patient is also working with the CCM pharmacist to help with management of blood pressure medications. 05-25-2020: 05/09/2020 146/71 p-81, 05-10-2020: 142/100, p-72, 05-17-2020 150/97, p-73 - depression screen reviewed - home or ambulatory blood pressure monitoring encouraged Follow Up  Plan: Telephone follow up appointment with care management team member scheduled for: 07-27-2020 at 14 am   Task: RNCM: Identify and Monitor Blood Pressure Elevation   Note:   Care Management Activities:    - blood pressure trends reviewed - depression screen reviewed - home or ambulatory blood pressure monitoring encouraged       Patient Care Plan: RNCM: HLD management    Problem Identified: RNCM: HLD management   Priority: Medium    Long-Range Goal: RNCM: HLD Management   Priority: Medium  Note:   Current Barriers:  . Poorly controlled hyperlipidemia, complicated by memory loss, dementia, elevated blood pressures  . Current antihyperlipidemic regimen: Omega 3 1200 mg daily  . Most recent lipid panel:  . Lab Results .  Component . Value . Date .   [name] CHOL . 176 . 06/10/2018 .   [name] HDL . 35 (L) . 06/10/2018 .   [name]  [place] . 121 (H) . 06/10/2018 .   [name] TRIG . 98 . 06/10/2018 .   [name] CHOLHDL . 5.0 (H) . 06/10/2018 .    [name] ASCVD risk enhancing conditions: age >41, HTN,  former smoker . Unable to independently manage HLD  . Lacks social connections . Does not contact provider office for questions/concerns  RN Care Manager Clinical Goal(s):  [name] Over the next 120 days, patient will work with Consulting civil engineer, providers, and care team towards execution of optimized self-health management plan . Over the next 120 days, patient will verbalize understanding of plan for effective management of HLD  . Over the next 120 days, patient will work with [facility] team and pcp to address needs related to management of HLD   Interventions: . Collaboration with [name], DO regarding development and update of comprehensive plan of care as evidenced by provider attestation and co-signature . Inter-disciplinary care team collaboration (see longitudinal plan of care) . Medication review performed; medication list updated in electronic medical record.  Bertram Savin care team  collaboration (see longitudinal plan of care) . Referred to pharmacy team for assistance with HLD medication management . Evaluation of current treatment plan related to HLD and patient's adherence to plan as established by provider. 05-25-2020: The patient saw pcp 05-05-2020. The patient is overall doing about the same but has worsening memory issues. The patients daughter is with the patient a lot but is wanting additional help. Referrals are in place and will reach out to LCSW for assistance.  . Advised patient to call the office with changes or new questions  . Provided education to patient re: heart healthy diet. 05-25-2020: The patient is eating well and the patients daughter tries to make sure he watches sodium and fats. . Discussed plans with patient for ongoing care management follow up and provided patient with direct contact information for care management team   Patient Goals/Self-Care Activities: . Over the next 120 days, patient will:   - call for medicine refill 2 or 3 days before it runs out - call if I am sick and can't take my medicine - keep a list of all the medicines I take; vitamins and herbals too - learn to read medicine labels - use a pillbox to sort medicine - use an alarm clock or phone to remind me to take my medicine - drink 6 to 8 glasses of water each day - eat 3 to 5 servings of fruits and vegetables each day - eat 5 or 6 small meals each day - limit fast food meals to no more than 1 per week - manage portion size - prepare main meal at home 3 to 5 days each week - read food labels for fat, fiber, carbohydrates and portion size - be open to making changes - I can manage, know and watch for signs of a heart attack - if I have chest pain, call for help - learn about small changes that will make a big difference - learn my personal risk factors - education plan reviewed and/or amended - empathy and reassurance conveyed - family/caregiver participation in  learning encouraged - health literacy screen reviewed - patient's preferred learning methods utilized - privacy ensured - questions encouraged - readiness to learn monitored   Follow Up Plan: Telephone follow up appointment with care management team member scheduled for:07-27-2020 at 55 am     Task: RNCM: Identify Deficit and Optimize Health Literacy   Note:   Care  Management Activities:    - education plan reviewed and/or amended - empathy and reassurance conveyed - family/caregiver participation in learning encouraged - health literacy screen reviewed - patient's preferred learning methods utilized - privacy ensured - questions encouraged - readiness to learn monitored         Patient verbalizes understanding of instructions provided today and agrees to view in [place].   Telephone follow up appointment with care management team member scheduled for: 07-27-2020 at 46 am  Noreene Larsson RN, MSN, [place] Mobile: [phone]

## 2020-05-26 ENCOUNTER — Telehealth: Payer: Self-pay | Admitting: Primary Care

## 2020-05-26 NOTE — Telephone Encounter (Signed)
Spoke with patient's daughter, [name], regarding the Palliative referral/services and all questions were answered and she was in agreement with starting services.  I have scheduled an In-home Consult for 06/13/20 @ 2:30 PM

## 2020-05-29 ENCOUNTER — Other Ambulatory Visit: Payer: Self-pay

## 2020-05-29 ENCOUNTER — Encounter: Payer: Self-pay | Admitting: Cardiology

## 2020-05-29 ENCOUNTER — Telehealth: Payer: Self-pay | Admitting: *Deleted

## 2020-05-29 ENCOUNTER — Ambulatory Visit: Payer: Medicare Other | Admitting: Cardiology

## 2020-05-29 VITALS — BP 138/80 | HR 74 | Ht 72.0 in | Wt 220.0 lb

## 2020-05-29 DIAGNOSIS — R072 Precordial pain: Secondary | ICD-10-CM

## 2020-05-29 DIAGNOSIS — I10 Essential (primary) hypertension: Secondary | ICD-10-CM | POA: Diagnosis not present

## 2020-05-29 DIAGNOSIS — E7800 Pure hypercholesterolemia, unspecified: Secondary | ICD-10-CM | POA: Diagnosis not present

## 2020-05-29 NOTE — Telephone Encounter (Signed)
   Telephone encounter was:  Successful.  05/29/2020 Name: Randy [name] MRN: [id number] DOB: [birth year]  Randy [name] is a 86 y.o. year old male who is a primary care patient of [name], DO . The community resource team was consulted for assistance with Transportation Needs , Food Insecurity and [place] guide performed the following interventions: Patient provided with information about care guide support team and interviewed to confirm resource needs Discussed resources to assist with Going to email her senior resources and also ask for social work consult for in home care providing a list of providers in the  area.  Follow Up Plan:  No further follow up planned at this time. The patient has been provided with needed resources.  [place], Care Management  [phone] [address] , [place] [address] Email : [email or web address]

## 2020-05-29 NOTE — Patient Instructions (Signed)
Medication Instructions:   Your physician recommends that you continue on your current medications as directed. Please refer to the Current Medication list given to you today.  *If you need a refill on your cardiac medications before your next appointment, please call your pharmacy*   Lab Work:  Your physician recommends that you return for a FASTING lipid profile: at your earliest convenience.  - You will need to be fasting. Please do not have anything to eat or drink after midnight the morning you have the lab work. You may only have water or black coffee with no cream or sugar. - Please go to the [facility]. You will check in at the front desk to the right as you walk into the atrium. Valet Parking is offered if needed. - No appointment needed. You may go any day between 7 am and 6 pm.  Testing/Procedures:  1.  Your physician has requested that you have an echocardiogram. Echocardiography is a painless test that uses sound waves to create images of your heart. It provides your doctor with information about the size and shape of your heart and how well your heart's chambers and valves are working. This procedure takes approximately one hour. There are no restrictions for this procedure.   2.  [place]    Your caregiver has ordered a Stress Test with nuclear imaging. The purpose of this test is to evaluate the blood supply to your heart muscle. This procedure is referred to as a "Non-Invasive Stress Test." This is because other than having an IV started in your vein, nothing is inserted or "invades" your body. Cardiac stress tests are done to find areas of poor blood flow to the heart by determining the extent of coronary artery disease (CAD). Some patients exercise on a treadmill, which naturally increases the blood flow to your heart, while others who are  unable to walk on a treadmill due to physical limitations have a pharmacologic/chemical stress agent called Lexiscan . This  medicine will mimic walking on a treadmill by temporarily increasing your coronary blood flow.      PLEASE REPORT TO [facility] MEDICAL MALL ENTRANCE   THE VOLUNTEERS AT THE FIRST DESK WILL DIRECT YOU WHERE TO GO     *Please note: these test may take anywhere between 2-4 hours to complete       Date of Procedure:_____________________________________   Arrival Time for Procedure:______________________________    PLEASE NOTIFY THE OFFICE AT LEAST 24 HOURS IN ADVANCE IF YOU ARE UNABLE TO KEEP YOUR APPOINTMENT.  [place] 24 HOURS IN ADVANCE IF YOU ARE UNABLE TO KEEP YOUR APPOINTMENT. [phone]         How to prepare for your Myoview test:        __XX__:  Hold other medications as follows:   furosemide (LASIX) 20 MG tablet     1. Do not eat or drink after midnight  2. No caffeine for 24 hours prior to test  3. No smoking 24 hours prior to test.  4. Unless instructed otherwise, Take your medication with a small sips of water.    5.         Ladies, please do not wear dresses. Skirts or pants are appropriate. Please wear a short sleeve shirt.  6. No perfume, cologne or lotion.  7. Wear comfortable walking shoes. No heels!     Follow-Up: At [facility], you and your health needs are our priority.  As part of our continuing mission to provide you with exceptional heart care, we have created designated Provider Care Teams.  These Care Teams include your primary Cardiologist (physician) and Advanced Practice Providers (APPs -  Physician Assistants and Nurse Practitioners) who all work together to provide you with the care you need, when you need it.  We recommend signing up for the patient portal called "MyChart".  Sign up information is provided on this After Visit Summary.  MyChart is used to connect with patients for Virtual Visits (Telemedicine).  Patients are able to view lab/test results, encounter notes, upcoming  appointments, etc.  Non-urgent messages can be sent to your provider as well.   To learn more about what you can do with MyChart, go to [email or web address].    Your next appointment:   Follow up after Testing   The format for your next appointment:   In Person  Provider:   Kate Sable, MD   Other Instructions

## 2020-05-29 NOTE — Progress Notes (Signed)
Cardiology Office Note:    Date:  05/29/2020   ID:  Jada, Kuhnert 10/03/1934, MRN [id number]  PCP:  [name], DO   [place] Providers Cardiologist:  Kate Sable, MD     Referring MD: [name] *   Chief Complaint  Patient presents with  . New Patient (Initial Visit)    Referred by PCP for chest pressure and hypertension. Meds reviewed verbally with patient.     History of Present Illness:    GARRIE WOODIN is a 86 y.o. male with a hx of hypertension, hyperlipidemia, Lewy body dementia presenting with chest pain.  Patient states having chest discomfort over the past 3 months.  Symptoms are not related with exertion.  Also has lower extremity edema ongoing for years now.  Symptoms alcohol about 2 times a week, describes it as pressure.  Presented to the ED on 05/09/2020 with chest pain.  Troponins were normal, outpatient work-up recommended.  He denies any history of heart disease.  Takes Lasix for edema.  Past Medical History:  Diagnosis Date  . Arthritis   . GERD (gastroesophageal reflux disease)   . Hyperlipidemia   . Hypertension   . Lewy body dementia ([place])   . Osteoporosis   . Prostate enlargement     Past Surgical History:  Procedure Laterality Date  . CHOLECYSTECTOMY N/A 09/10/2018   Procedure: LAPAROSCOPIC CHOLECYSTECTOMY;  Surgeon: Benjamine Sprague, DO;  Location: [facility] ORS;  Service: General;  Laterality: N/A;  . GANGLION CYST EXCISION Left 2013   Left upper arm  . HERNIA REPAIR N/A 2008   [place], ventral acquired hernia repair  . HOLEP-LASER ENUCLEATION OF THE PROSTATE WITH MORCELLATION N/A 11/06/2018   Procedure: HOLEP-LASER ENUCLEATION OF THE PROSTATE WITH MORCELLATION;  Surgeon: [name], MD;  Location: [facility] ORS;  Service: Urology;  Laterality: N/A;    Current Medications: Current Meds  Medication Sig  . carbidopa-levodopa (SINEMET IR) 25-100 MG tablet Take by mouth. Take 2 tablets by mouth 4 (four) times daily   . cholecalciferol (VITAMIN D) 1000 units tablet Take 1,000 Units by mouth daily.  . Coenzyme Q10 (CO Q 10) 100 MG CAPS Take 100 mg by mouth daily.   . diclofenac Sodium (VOLTAREN) 1 % GEL Apply 2 g topically 4 (four) times daily as needed. To knee arthritis pain  . docusate sodium (COLACE) 100 MG capsule Take 1 capsule (100 mg total) by mouth 2 (two) times daily.  . enalapril (VASOTEC) 10 MG tablet Take 1 tablet (10 mg total) by mouth daily.  . fluticasone (FLONASE) 50 MCG/ACT nasal spray Place 2 sprays into both nostrils daily as needed for allergies.   . furosemide (LASIX) 20 MG tablet Take 1 tablet (20 mg total) by mouth every other day.  . melatonin 5 MG TABS Take 10 mg by mouth at bedtime as needed (sleep).  . Multiple Vitamin (MULTIVITAMIN WITH MINERALS) TABS tablet Take 1 tablet by mouth daily.  . mupirocin ointment (BACTROBAN) 2 % Apply topically 2 (two) times daily.  [name] OLANZapine (ZYPREXA) 5 MG tablet TAKE 1 TABLET (5 MG TOTAL) BY MOUTH AT BEDTIME AS NEEDED (AGITATION).  . Omega-3 Fatty Acids (FISH OIL) 1200 MG CAPS Take 1,200 mg by mouth 2 (two) times daily.   [name] omeprazole (PRILOSEC) 20 MG capsule Take 20 mg by mouth daily.  . rivastigmine (EXELON) 1.5 MG capsule Take 1 capsule by mouth 2 (two) times daily with a meal.  . traMADol (ULTRAM) 50 MG tablet Take 1  tablet (50 mg total) by mouth every 8 (eight) hours as needed.  . vitamin C (ASCORBIC ACID) 500 MG tablet Take 500 mg by mouth daily.  [name] zinc sulfate 220 (50 Zn) MG capsule Take 1 capsule (220 mg total) by mouth daily.     Allergies:   Antihistamines, chlorpheniramine-type   Social History   Socioeconomic History  . Marital status: Married    Spouse name: Not on file  . Number of children: Not on file  . Years of education: College  . Highest education level: Not on file  Occupational History  . Occupation: Retired Clinical biochemist  Tobacco Use  . Smoking status: Former Smoker    Packs/day: 1.00    Years: 12.00    Pack  years: 12.00    Types: Cigarettes    Quit date: 1968    Years since quitting: 54.4  . Smokeless tobacco: Never Used  Vaping Use  . Vaping Use: Never used  Substance and Sexual Activity  . Alcohol use: No  . Drug use: No  . Sexual activity: Not on file  Other Topics Concern  . Not on file  Social History Narrative  . Not on file   Social Determinants of Health   Financial Resource Strain: Not on file  Food Insecurity: Not on file  Transportation Needs: Not on file  Physical Activity: Not on file  Stress: Not on file  Social Connections: Not on file     Family History: The patient's family history includes Breast cancer in his mother; Heart disease in his brother; Leukemia in his brother; Lung cancer in his father. There is no history of Prostate cancer, Colon cancer, Bladder Cancer, or Kidney cancer.  ROS:   Please see the history of present illness.     All other systems reviewed and are negative.  EKGs/Labs/Other Studies Reviewed:    The following studies were reviewed today:   EKG:  EKG is  ordered today.  The ekg ordered today demonstrates normal sinus rhythm  Recent Labs: 05/09/2020: ALT 5; B Natriuretic Peptide 211.3; BUN 28; Creatinine, Ser 0.98; Hemoglobin 12.1; Platelets 192; Potassium 3.5; Sodium 138  Recent Lipid Panel    Component Value Date/Time   CHOL 176 06/10/2018 0800   CHOL 151 04/17/2015 0826   TRIG 98 06/10/2018 0800   TRIG 116 04/17/2015 0826   HDL 35 (L) 06/10/2018 0800   HDL 32 (A) 04/17/2015 0826   CHOLHDL 5.0 (H) 06/10/2018 0800   VLDL 27 05/27/2016 0856   LDLCALC 121 (H) 06/10/2018 0800     Risk Assessment/Calculations:      Physical Exam:    VS:  BP 138/80 (BP Location: Right Arm, Patient Position: Sitting, Cuff Size: Large)   Pulse 74   Ht 6' (1.829 m)   Wt 220 lb (99.8 kg)   BMI 29.84 kg/m     Wt Readings from Last 3 Encounters:  05/29/20 220 lb (99.8 kg)  02/15/20 228 lb (103.4 kg)  02/08/20 228 lb (103.4 kg)      GEN:  Well nourished, well developed in no acute distress HEENT: Normal NECK: No JVD; No carotid bruits LYMPHATICS: No lymphadenopathy CARDIAC: RRR, no murmurs, rubs, gallops RESPIRATORY:  Clear anteriorly, diminished breath sounds at bases ABDOMEN: Soft, non-tender, non-distended MUSCULOSKELETAL:  1+ nonpitting edema; No deformity  SKIN: Warm and dry NEUROLOGIC:  Alert and oriented x 3 PSYCHIATRIC:  Normal affect   ASSESSMENT:    1. Precordial pain   2. Primary hypertension   3.  Pure hypercholesterolemia    PLAN:    In order of problems listed above:  1. Patient with chest pain, risk factors hypertension, hyperlipidemia.  Get echocardiogram and Lexiscan Myoview to evaluate cardiac function, presence of ischemia respectively. 2. Hypertension, BP controlled.  Continue current BP meds. 3. Hyperlipidemia, last cholesterol levels 2020.  Obtain repeat fasting lipid profile.  Follow-up after echo, Myoview, lipid profile.   Shared Decision Making/Informed Consent The risks [chest pain, shortness of breath, cardiac arrhythmias, dizziness, blood pressure fluctuations, myocardial infarction, stroke/transient ischemic attack, nausea, vomiting, allergic reaction, radiation exposure, metallic taste sensation and life-threatening complications (estimated to be 1 in 10,000)], benefits (risk stratification, diagnosing coronary artery disease, treatment guidance) and alternatives of a nuclear stress test were discussed in detail with Mr. Shadwick and he agrees to proceed.    Medication Adjustments/Labs and Tests Ordered: Current medicines are reviewed at length with the patient today.  Concerns regarding medicines are outlined above.  Orders Placed This Encounter  Procedures  . NM Myocar Multi W/Spect W/Wall Motion / EF  . Lipid panel  . EKG 12-Lead  . ECHOCARDIOGRAM COMPLETE   No orders of the defined types were placed in this encounter.   Patient Instructions   Medication  Instructions:   Your physician recommends that you continue on your current medications as directed. Please refer to the Current Medication list given to you today.  *If you need a refill on your cardiac medications before your next appointment, please call your pharmacy*   Lab Work:  Your physician recommends that you return for a FASTING lipid profile: at your earliest convenience.  - You will need to be fasting. Please do not have anything to eat or drink after midnight the morning you have the lab work. You may only have water or black coffee with no cream or sugar. - Please go to the [facility]. You will check in at the front desk to the right as you walk into the atrium. Valet Parking is offered if needed. - No appointment needed. You may go any day between 7 am and 6 pm.  Testing/Procedures:  1.  Your physician has requested that you have an echocardiogram. Echocardiography is a painless test that uses sound waves to create images of your heart. It provides your doctor with information about the size and shape of your heart and how well your heart's chambers and valves are working. This procedure takes approximately one hour. There are no restrictions for this procedure.   2.  [place]    Your caregiver has ordered a Stress Test with nuclear imaging. The purpose of this test is to evaluate the blood supply to your heart muscle. This procedure is referred to as a "Non-Invasive Stress Test." This is because other than having an IV started in your vein, nothing is inserted or "invades" your body. Cardiac stress tests are done to find areas of poor blood flow to the heart by determining the extent of coronary artery disease (CAD). Some patients exercise on a treadmill, which naturally increases the blood flow to your heart, while others who are  unable to walk on a treadmill due to physical limitations have a pharmacologic/chemical stress agent called Lexiscan . This medicine  will mimic walking on a treadmill by temporarily increasing your coronary blood flow.      PLEASE REPORT TO [facility] MEDICAL MALL ENTRANCE   THE VOLUNTEERS AT THE FIRST DESK WILL DIRECT YOU WHERE TO GO     *Please note:  these test may take anywhere between 2-4 hours to complete       Date of Procedure:_____________________________________   Arrival Time for Procedure:______________________________    PLEASE NOTIFY THE OFFICE AT LEAST 24 HOURS IN ADVANCE IF YOU ARE UNABLE TO KEEP YOUR APPOINTMENT.  [place] 24 HOURS IN ADVANCE IF YOU ARE UNABLE TO KEEP YOUR APPOINTMENT. [phone]         How to prepare for your Myoview test:        __XX__:  Hold other medications as follows:   furosemide (LASIX) 20 MG tablet     1. Do not eat or drink after midnight  2. No caffeine for 24 hours prior to test  3. No smoking 24 hours prior to test.  4. Unless instructed otherwise, Take your medication with a small sips of water.    5.         Ladies, please do not wear dresses. Skirts or pants are appropriate. Please wear a short sleeve shirt.  6. No perfume, cologne or lotion.  7. Wear comfortable walking shoes. No heels!     Follow-Up: At [facility], you and your health needs are our priority.  As part of our continuing mission to provide you with exceptional heart care, we have created designated Provider Care Teams.  These Care Teams include your primary Cardiologist (physician) and Advanced Practice Providers (APPs -  Physician Assistants and Nurse Practitioners) who all work together to provide you with the care you need, when you need it.  We recommend signing up for the patient portal called "MyChart".  Sign up information is provided on this After Visit Summary.  MyChart is used to connect with patients for Virtual Visits (Telemedicine).  Patients are able to view lab/test results, encounter notes, upcoming appointments, etc.   Non-urgent messages can be sent to your provider as well.   To learn more about what you can do with MyChart, go to [email or web address].    Your next appointment:   Follow up after Testing   The format for your next appointment:   In Person  Provider:   Kate Sable, MD   Other Instructions      Signed, Kate Sable, MD  05/29/2020 11:03 AM    [place]

## 2020-05-30 ENCOUNTER — Ambulatory Visit: Payer: Self-pay | Admitting: Licensed Clinical Social Worker

## 2020-05-30 DIAGNOSIS — I10 Essential (primary) hypertension: Secondary | ICD-10-CM

## 2020-05-30 NOTE — Chronic Care Management (AMB) (Signed)
    Clinical Social Work  Care Management   Phone Outreach    05/30/2020 Name: BERTHA EARWOOD MRN: [id number] DOB: 1934/11/14  ULRICK METHOT is a 86 y.o. year old male who is a primary care patient of [name], DO .   CCM LCSW reached out to patient today by phone to introduce self, assess needs and offer Care Management services and interventions.    Unable to keep phone appointment today and requested to reschedule.  Plan:Appointment was rescheduled with CCM LCSW for 05/31/20  Review of patient status, including review of consultants reports, relevant laboratory and other test results, and collaboration with appropriate care team members and the patient's provider was performed as part of comprehensive patient evaluation and provision of care management services.     Christa See, MSW, [place] [facility] Care Management [email or web address] Phone [phone] 1:03 PM

## 2020-05-31 ENCOUNTER — Ambulatory Visit: Payer: Self-pay | Admitting: Licensed Clinical Social Worker

## 2020-05-31 DIAGNOSIS — R296 Repeated falls: Secondary | ICD-10-CM

## 2020-05-31 DIAGNOSIS — M25562 Pain in left knee: Secondary | ICD-10-CM

## 2020-05-31 DIAGNOSIS — I10 Essential (primary) hypertension: Secondary | ICD-10-CM

## 2020-05-31 DIAGNOSIS — M47816 Spondylosis without myelopathy or radiculopathy, lumbar region: Secondary | ICD-10-CM | POA: Diagnosis not present

## 2020-05-31 DIAGNOSIS — F0280 Dementia in other diseases classified elsewhere without behavioral disturbance: Secondary | ICD-10-CM

## 2020-05-31 DIAGNOSIS — E782 Mixed hyperlipidemia: Secondary | ICD-10-CM

## 2020-05-31 DIAGNOSIS — M25561 Pain in right knee: Secondary | ICD-10-CM

## 2020-05-31 DIAGNOSIS — G3183 Dementia with Lewy bodies: Secondary | ICD-10-CM | POA: Diagnosis not present

## 2020-05-31 DIAGNOSIS — G8929 Other chronic pain: Secondary | ICD-10-CM

## 2020-05-31 NOTE — Patient Instructions (Signed)
Visit Information  Goals Addressed              This Visit's Progress     Patient Stated   .  SW-"I'm having a hard time managing my health" (pt-stated)   On track     Patient Self Care Activities:  . Attend all scheduled provider appointments . Call provider office for new concerns or questions . Continue utilizing healthy coping strategies to manage stressors      Other   .  SW-Manage My Health and Emotions   On track     Timeframe:  Long-Range Goal Priority:  Medium  Start Date:  04/11/20                         Expected End Date:  09/06/20                  Follow Up Date- 08/16/20  Patient Self Care Activities:  . Attend all scheduled provider appointments . Call provider office for new concerns or questions . Continue utilizing healthy coping strategies to manage stressors       Patient verbalizes understanding of instructions provided today and agrees to view in [place].   Telephone follow up appointment with care management team member scheduled for:08/16/20  Christa See, MSW, [name] Phone [phone] 12:27 PM

## 2020-05-31 NOTE — Chronic Care Management (AMB) (Signed)
Chronic Care Management    Clinical Social Work Note  05/31/2020 Name: Randy [name] MRN: [id number] DOB: [birth year]  Randy [name] is a 86 y.o. year old male who is a primary care patient of [name], DO. The CCM team was consulted to assist the patient with chronic disease management and/or care coordination needs related to: Caregiver Stress.   Engaged with patient by telephone for follow up visit in response to provider referral for social work chronic care management and care coordination services.   Consent to Services:  The patient was given information about Chronic Care Management services, agreed to services, and gave verbal consent prior to initiation of services.  Please see initial visit note for detailed documentation.   Patient agreed to services and consent obtained.   Assessment: Review of patient past medical history, allergies, medications, and health status, including review of relevant consultants reports was performed today as part of a comprehensive evaluation and provision of chronic care management and care coordination services.   Follow up Plan: Patient would like continued follow-up.  CCM LCSW will follow up with patient on 08/16/20. Patient will call office if needed prior to next encounter.   SDOH (Social Determinants of Health) assessments and interventions performed:    Advanced Directives Status: Not addressed in this encounter.  CCM Care Plan  Allergies  Allergen Reactions  . Antihistamines, Chlorpheniramine-Type Other (See Comments)    Can't urinate    Outpatient Encounter Medications as of 05/31/2020  Medication Sig  . carbidopa-levodopa (SINEMET IR) 25-100 MG tablet Take by mouth. Take 2 tablets by mouth 4 (four) times daily  . cholecalciferol (VITAMIN D) 1000 units tablet Take 1,000 Units by mouth daily.  . Coenzyme Q10 (CO Q 10) 100 MG CAPS Take 100 mg by mouth daily.   . diclofenac Sodium (VOLTAREN) 1 % GEL Apply 2 g topically 4  (four) times daily as needed. To knee arthritis pain  . docusate sodium (COLACE) 100 MG capsule Take 1 capsule (100 mg total) by mouth 2 (two) times daily.  . enalapril (VASOTEC) 10 MG tablet Take 1 tablet (10 mg total) by mouth daily.  . fluticasone (FLONASE) 50 MCG/ACT nasal spray Place 2 sprays into both nostrils daily as needed for allergies.   . furosemide (LASIX) 20 MG tablet Take 1 tablet (20 mg total) by mouth every other day.  . melatonin 5 MG TABS Take 10 mg by mouth at bedtime as needed (sleep).  . Multiple Vitamin (MULTIVITAMIN WITH MINERALS) TABS tablet Take 1 tablet by mouth daily.  . mupirocin ointment (BACTROBAN) 2 % Apply topically 2 (two) times daily.  [name] OLANZapine (ZYPREXA) 5 MG tablet TAKE 1 TABLET (5 MG TOTAL) BY MOUTH AT BEDTIME AS NEEDED (AGITATION).  . Omega-3 Fatty Acids (FISH OIL) 1200 MG CAPS Take 1,200 mg by mouth 2 (two) times daily.   [name] omeprazole (PRILOSEC) 20 MG capsule Take 20 mg by mouth daily.  . rivastigmine (EXELON) 1.5 MG capsule Take 1 capsule by mouth 2 (two) times daily with a meal.  . traMADol (ULTRAM) 50 MG tablet Take 1 tablet (50 mg total) by mouth every 8 (eight) hours as needed.  . vitamin C (ASCORBIC ACID) 500 MG tablet Take 500 mg by mouth daily.  [name] zinc sulfate 220 (50 Zn) MG capsule Take 1 capsule (220 mg total) by mouth daily.   No facility-administered encounter medications on file as of 05/31/2020.    Patient Active Problem List   Diagnosis Date Noted  .  Bilateral primary osteoarthritis of knee 02/15/2020  . Pressure injury of buttock, stage 1   . Delirium   . COVID-19 virus infection 10/08/2019  . Unable to care for self 10/08/2019  . Caregiver unable to cope 10/08/2019  . Recurrent falls 10/08/2019  . Lewy body dementia without behavioral disturbance ([place])   . Prostate cancer ([place]) 06/30/2019  . BPH with urinary obstruction 11/06/2018  . Acute cholecystitis 09/08/2018  . Myalgia due to statin 02/24/2018  . Tricompartment  osteoarthritis of left knee 11/14/2017  . Generalized weakness 04/28/2017  . Chronic low back pain 03/12/2017  . Knee pain, chronic 10/01/2016  . Chronic venous insufficiency 08/30/2016  . BPH without obstruction/lower urinary tract symptoms 03/06/2016  . Osteoarthritis of multiple joints 03/06/2016  . Degenerative joint disease (DJD) of lumbar spine 03/06/2016  . Essential hypertension 03/05/2016  . Hyperlipidemia 03/05/2016  . Osteoporosis 03/05/2016  . GERD (gastroesophageal reflux disease) 03/05/2016  . Bilateral lower extremity edema 03/05/2016    Conditions to be addressed/monitored: Stress; Level of care concerns  Care Plan : General Social Work (Adult)  Updates made by Rebekah Chesterfield, LCSW since 05/31/2020 12:00 AM    Problem: Caregiver Stress     Long-Range Goal: Caregiver Coping Optimized for Daughter   Start Date: 04/11/2020  This Visit's Progress: On track  Priority: Medium  Note:   Timeframe:  Long-Range Goal Priority:  Medium  Start Date:  04/11/20                         Expected End Date:  09/06/20                  Follow Up Date- 08/16/20  Current Barriers:  . Financial constraints . Level of care concerns . ADL IADL limitations . Social Isolation . Limited access to caregiver . Memory Deficits . Lacks knowledge of community resource: personal care service resources within the area that may benefit patient . Lack of socialization   Clinical Social Work Clinical Goal(s):  [name] Over the next 90 days, client will work with SW to address concerns related to lack of care/support within the home  Interventions: . Patient interviewed and appropriate assessments performed . Patient has an upcoming appointment with Mona next month . Patient's ramp was installed on 05/27/20. He is appreciative for the services provided by [employer] and has been enjoying spending time outside when the weather permits . Patient's daughter, Randy [name]  reports that she is reviewing resources provided by [facility] and previous CCM LCSW. She has no additional resource needs at this time . CCM LCSW informed patient's daughter that [facility] has opened back up. Family has their contact information and agreed to contact them when ready . Discussed plans with patient for ongoing care management follow up and provided patient with direct contact information for care management team . Advised patient to contact CCM team with any urgent concerns  Patient Self Care Activities:  . Attend all scheduled provider appointments . Call provider office for new concerns or questions . Continue utilizing healthy coping strategies to manage stressors      Christa See, MSW, [facility] Management [email or web address] Phone [phone] 12:25 PM

## 2020-06-06 ENCOUNTER — Other Ambulatory Visit: Payer: Self-pay

## 2020-06-06 ENCOUNTER — Telehealth: Payer: Self-pay | Admitting: Medical

## 2020-06-06 ENCOUNTER — Telehealth: Payer: Self-pay | Admitting: Cardiology

## 2020-06-06 ENCOUNTER — Encounter
Admission: RE | Admit: 2020-06-06 | Discharge: 2020-06-06 | Disposition: A | Discharge status: Home / Self Care | Payer: Medicare Other | Source: Ambulatory Visit | Attending: Cardiology | Admitting: Cardiology

## 2020-06-06 DIAGNOSIS — R072 Precordial pain: Secondary | ICD-10-CM | POA: Insufficient documentation

## 2020-06-06 MED ORDER — TECHNETIUM TC 99M TETROFOSMIN IV KIT
10.0000 | PACK | Freq: Once | INTRAVENOUS | Status: AC | PRN
  Administered 2020-06-06: 10.51 via INTRAVENOUS

## 2020-06-06 NOTE — Telephone Encounter (Signed)
Vinnie Level from [facility] calling to report that patient was unable to have test preformed due to claustrophobia and pain. [facility] recommends anxiety or pain meds before having him come back or another test   Please advise patient

## 2020-06-06 NOTE — Telephone Encounter (Signed)
Patient was scheduled for Lexiscan Myoview stress test today. Patient arrived and test began, however was not completed. I was informed by Nuc [facility] staff patient was unable to complete imaging portion of the test due to claustrophobia. Patient did not want stay for any further testing, and went home before I could speak with him. I will forward to Dr. Garen Lah.

## 2020-06-06 NOTE — Telephone Encounter (Signed)
Patient daughter calling to discuss below and POC.

## 2020-06-13 ENCOUNTER — Other Ambulatory Visit: Payer: Self-pay

## 2020-06-13 ENCOUNTER — Other Ambulatory Visit: Payer: Medicare Other | Admitting: Primary Care

## 2020-06-13 DIAGNOSIS — Z789 Other specified health status: Secondary | ICD-10-CM

## 2020-06-13 DIAGNOSIS — R600 Localized edema: Secondary | ICD-10-CM

## 2020-06-13 DIAGNOSIS — Z515 Encounter for palliative care: Secondary | ICD-10-CM

## 2020-06-13 DIAGNOSIS — I10 Essential (primary) hypertension: Secondary | ICD-10-CM

## 2020-06-13 DIAGNOSIS — M1712 Unilateral primary osteoarthritis, left knee: Secondary | ICD-10-CM

## 2020-06-13 NOTE — Progress Notes (Signed)
[facility] Palliative Care Consult Note Telephone: [phone]  Fax: [phone]    Date of encounter: 06/13/20 PATIENT NAME: Randy [name] [address] [place] [address]   [phone] (home)  DOB: [birth year] MRN: [id number] PRIMARY CARE PROVIDER:    [name], DO,  [place] [address] [phone]  REFERRING PROVIDER:   [name], DO [address] [place],  [place] [address] [phone]  RESPONSIBLE PARTY:    Contact Information    Name Relation Home Work Mobile   Randy [name],Randy [name] Daughter   [phone]   Randy [name], Randy [name] [phone]         I met face to face with patient and family in their home. Palliative Care was asked to follow this patient by consultation request of  [name] * to address advance care planning and complex medical decision making. This is the initial visit.                                     ASSESSMENT AND PLAN / RECOMMENDATIONS:   Advance Care Planning/Goals of Care: Goals include to maximize quality of life and symptom management. Our advance care planning conversation included a discussion about:     The value and importance of advance care planning   Exploration of personal, cultural or spiritual beliefs that might influence medical decisions   Exploration of goals of care in the event of a sudden injury or illness   Identification of a healthcare agent - daughter  Review  of an  advance directive document .  CODE STATUS: FULL,   MOST left, will continue to discuss on next meeting.  Symptom Management/Plan:  Meal prep: Not eating regular meals but reports not losing weight. Daughter reports he does not eat meals but gets snacks for breakfast and lunch. I recommended [employer] menus and sent a referral per email. Daughter has number as well.   Pain: In knees due to injury, OA. He has walker to help with ambulation and a lift chair for rising.  Recommended acetaminophen CR [phone] mb q 8 hrs.   BP: High today, taking lasix 20 mg qod and enalapril 10 mg daily. Has 2-3+ edema to mid calf, lungs clear but Bps are ranging high at 150/100. Recommend addressing for lower target. Has f/u with cardiology. We discussed having them assess bp along with other sx and propose a plan since they are already in process with work up. I will let PCP know RE bps and he would may benefit from increased lasix. He trends low levels of potassium historically, however, and that may need supplementation.  Caregiver strain: Daughter lives with parents to care for them, works and helps with children to school. We discussed them hiring someone for mid day to come help with chores and be a presence, help with meals and med cueing. She was open to this. We discussed chronic disease being progressive and needs would become greater. They stated they'd not discussed a plan for when they needed more care. I will continue to discuss and offer resources.  Follow up Palliative Care Visit: Palliative care will continue to follow for complex medical decision making, advance care planning, and clarification of goals. Return 6-8 weeks or prn.  I spent 60 minutes providing this consultation. More than 50% of the time in this consultation was spent in counseling and care coordination.  PPS: 40%  HOSPICE ELIGIBILITY/DIAGNOSIS: TBD  Chief Complaint: edema  HISTORY OF PRESENT ILLNESS:  Randy [name] is a 86 y.o. year old male  with HTN, LE edema, parkinson's disease, immobility .   History obtained from review of EMR, discussion with primary team, and interview with family, facility staff/caregiver and/or [name]  I reviewed available labs, medications, imaging, studies and related documents from the EMR.  Records reviewed and summarized above.   ROS  General: NAD EYES: denies vision changes, has glasses ENMT: denies dysphagia Cardiovascular:  Endorses DOE Pulmonary:  denies cough, denies increased SOB Abdomen: endorses good appetite, denies constipation, endorses occ incontinence of bowel GU: denies dysuria, endorses occ incontinence of urine MSK:  denies weakness,  no falls reported, using walker and lift chair Skin: denies rashes or wounds Neurological: endorses knee  pain, denies insomnia Psych: Endorses positive mood, denies hallucinations and nightmares Heme/lymph/immuno: denies bruises, abnormal bleeding  Physical Exam: Current and past weights: reported 225 lbs. Constitutional: NAD, 160/94 HR 64 RR 18  General: frail appearing, WNWD EYES: anicteric sclera, lids intact, no discharge  ENMT: intact hearing, oral mucous membranes moist, dentition intact CV: S1S2, RRR, 2-3+ LE edema bil Pulmonary: LCTA, no increased work of breathing, no cough, room air Abdomen: intake 100%, normo-active BS + 4 quadrants, soft and non tender, no ascites GU: deferred MSK: + sarcopenia, moves all extremities, ambulatory with walker Skin: warm and dry, no rashes or wounds on visible skin Neuro:  + generalized weakness,   severe cognitive impairment Psych: non-anxious affect, A and O x 2-3 Hem/lymph/immuno: no widespread bruising  CURRENT PROBLEM LIST:  Patient Active Problem List   Diagnosis Date Noted  . Bilateral primary osteoarthritis of knee 02/15/2020  . Pressure injury of buttock, stage 1   . Delirium   . COVID-19 virus infection 10/08/2019  . Unable to care for self 10/08/2019  . Caregiver unable to cope 10/08/2019  . Recurrent falls 10/08/2019  . Lewy body dementia without behavioral disturbance ([place])   . Prostate cancer ([place]) 06/30/2019  . BPH with urinary obstruction 11/06/2018  . Acute cholecystitis 09/08/2018  . Myalgia due to statin 02/24/2018  . Tricompartment osteoarthritis of left knee 11/14/2017  . Generalized weakness 04/28/2017  . Chronic low back pain 03/12/2017  . Knee pain, chronic 10/01/2016  . Chronic venous insufficiency  08/30/2016  . BPH without obstruction/lower urinary tract symptoms 03/06/2016  . Osteoarthritis of multiple joints 03/06/2016  . Degenerative joint disease (DJD) of lumbar spine 03/06/2016  . Essential hypertension 03/05/2016  . Hyperlipidemia 03/05/2016  . Osteoporosis 03/05/2016  . GERD (gastroesophageal reflux disease) 03/05/2016  . Bilateral lower extremity edema 03/05/2016   PAST MEDICAL HISTORY:  Active Ambulatory Problems    Diagnosis Date Noted  . Essential hypertension 03/05/2016  . Hyperlipidemia 03/05/2016  . Osteoporosis 03/05/2016  . GERD (gastroesophageal reflux disease) 03/05/2016  . Bilateral lower extremity edema 03/05/2016  . BPH without obstruction/lower urinary tract symptoms 03/06/2016  . Osteoarthritis of multiple joints 03/06/2016  . Degenerative joint disease (DJD) of lumbar spine 03/06/2016  . Chronic venous insufficiency 08/30/2016  . Knee pain, chronic 10/01/2016  . Chronic low back pain 03/12/2017  . Generalized weakness 04/28/2017  . Tricompartment osteoarthritis of left knee 11/14/2017  . Myalgia due to statin 02/24/2018  . Acute cholecystitis 09/08/2018  . BPH with urinary obstruction 11/06/2018  . Prostate cancer ([place]) 06/30/2019  . COVID-19 virus infection 10/08/2019  . Unable to care for self 10/08/2019  . Caregiver unable to  cope 10/08/2019  . Recurrent falls 10/08/2019  . Lewy body dementia without behavioral disturbance ([place])   . Delirium   . Pressure injury of buttock, stage 1   . Bilateral primary osteoarthritis of knee 02/15/2020   Resolved Ambulatory Problems    Diagnosis Date Noted  . No Resolved Ambulatory Problems   Past Medical History:  Diagnosis Date  . Arthritis   . Hypertension   . Lewy body dementia ([place])   . Prostate enlargement    SOCIAL HX:  Social History   Tobacco Use  . Smoking status: Former Smoker    Packs/day: 1.00    Years: 12.00    Pack years: 12.00    Types: Cigarettes    Quit date: 1968    Years  since quitting: 54.4  . Smokeless tobacco: Never Used  Substance Use Topics  . Alcohol use: No   FAMILY HX:  Family History  Problem Relation Age of Onset  . Breast cancer Mother   . Lung cancer Father   . Heart disease Brother   . Leukemia Brother   . Prostate cancer Neg Hx   . Colon cancer Neg Hx   . Bladder Cancer Neg Hx   . Kidney cancer Neg Hx       ALLERGIES:  Allergies  Allergen Reactions  . Antihistamines, Chlorpheniramine-Type Other (See Comments)    Can't urinate     PERTINENT MEDICATIONS:  Outpatient Encounter Medications as of 06/13/2020  Medication Sig  . carbidopa-levodopa (SINEMET IR) 25-100 MG tablet Take by mouth. Take 2 tablets by mouth 4 (four) times daily  . cholecalciferol (VITAMIN D) 1000 units tablet Take 1,000 Units by mouth daily.  . Coenzyme Q10 (CO Q 10) 100 MG CAPS Take 100 mg by mouth daily.   . diclofenac Sodium (VOLTAREN) 1 % GEL Apply 2 g topically 4 (four) times daily as needed. To knee arthritis pain  . docusate sodium (COLACE) 100 MG capsule Take 1 capsule (100 mg total) by mouth 2 (two) times daily.  . enalapril (VASOTEC) 10 MG tablet Take 1 tablet (10 mg total) by mouth daily.  . fluticasone (FLONASE) 50 MCG/ACT nasal spray Place 2 sprays into both nostrils daily as needed for allergies.   . furosemide (LASIX) 20 MG tablet Take 1 tablet (20 mg total) by mouth every other day.  . melatonin 5 MG TABS Take 10 mg by mouth at bedtime as needed (sleep).  . Multiple Vitamin (MULTIVITAMIN WITH MINERALS) TABS tablet Take 1 tablet by mouth daily.  . mupirocin ointment (BACTROBAN) 2 % Apply topically 2 (two) times daily.  [name] OLANZapine (ZYPREXA) 5 MG tablet TAKE 1 TABLET (5 MG TOTAL) BY MOUTH AT BEDTIME AS NEEDED (AGITATION).  . Omega-3 Fatty Acids (FISH OIL) 1200 MG CAPS Take 1,200 mg by mouth 2 (two) times daily.   [name] omeprazole (PRILOSEC) 20 MG capsule Take 20 mg by mouth daily.  . rivastigmine (EXELON) 1.5 MG capsule Take 1 capsule by mouth 2 (two)  times daily with a meal.  . traMADol (ULTRAM) 50 MG tablet Take 1 tablet (50 mg total) by mouth every 8 (eight) hours as needed.  . vitamin C (ASCORBIC ACID) 500 MG tablet Take 500 mg by mouth daily.  [name] zinc sulfate 220 (50 Zn) MG capsule Take 1 capsule (220 mg total) by mouth daily.   No facility-administered encounter medications on file as of 06/13/2020.    Thank you for the opportunity to participate in the care of Mr. Lazarus.  The  palliative care team will continue to follow. Please call our office at [phone] if we can be of additional assistance.   Jason Coop, NP , DNP, MPH, AGPCNP-BC, ACHPN  COVID-19 PATIENT SCREENING TOOL Asked and negative response unless otherwise noted:   Have you had symptoms of covid, tested positive or been in contact with someone with symptoms/positive test in the past 5-10 days?

## 2020-06-15 MED ORDER — ALPRAZOLAM 0.5 MG PO TABS: ORAL_TABLET | ORAL | 0 refills | Status: DC

## 2020-06-15 NOTE — Telephone Encounter (Signed)
Called and spoke with patients daughter. Gave her the recommendation as seen below. Called in prescription to patients pharmacy. Will route to scheduling to reschedule Myoview.   Okay to give preprocedural medication/anxiety medication if patient is agreeable.     If patient is agreeable, prescribed Xanax 0.5 mg x 1 about 30 to 90 minutes before procedure .    Please reschedule Myoview prior to follow up in the office on 07/11/20.

## 2020-06-15 NOTE — Addendum Note (Signed)
Addended by: Kavin Leech on: 06/15/2020 03:40 PM   Modules accepted: Orders

## 2020-06-29 ENCOUNTER — Other Ambulatory Visit: Payer: Self-pay | Admitting: Family Medicine

## 2020-06-29 ENCOUNTER — Ambulatory Visit: Payer: Medicare Other | Admitting: Urology

## 2020-06-29 ENCOUNTER — Encounter: Payer: Self-pay | Admitting: Urology

## 2020-06-29 ENCOUNTER — Other Ambulatory Visit: Payer: Self-pay

## 2020-06-29 VITALS — BP 136/81 | HR 76 | Ht 70.0 in | Wt 216.0 lb

## 2020-06-29 DIAGNOSIS — N138 Other obstructive and reflux uropathy: Secondary | ICD-10-CM | POA: Diagnosis not present

## 2020-06-29 DIAGNOSIS — M1712 Unilateral primary osteoarthritis, left knee: Secondary | ICD-10-CM

## 2020-06-29 DIAGNOSIS — M1711 Unilateral primary osteoarthritis, right knee: Secondary | ICD-10-CM

## 2020-06-29 DIAGNOSIS — N401 Enlarged prostate with lower urinary tract symptoms: Secondary | ICD-10-CM

## 2020-06-29 DIAGNOSIS — C61 Malignant neoplasm of prostate: Secondary | ICD-10-CM | POA: Diagnosis not present

## 2020-06-29 DIAGNOSIS — N390 Urinary tract infection, site not specified: Secondary | ICD-10-CM | POA: Diagnosis not present

## 2020-06-29 DIAGNOSIS — G8929 Other chronic pain: Secondary | ICD-10-CM

## 2020-06-29 NOTE — Telephone Encounter (Signed)
   Notes to clinic:  Alternative Requested:PA REQUIRED.   Requested Prescriptions  Pending Prescriptions Disp Refills   diclofenac Sodium (VOLTAREN) 1 % GEL [Pharmacy Med Name: DICLOFENAC SODIUM 1% GEL] 100 g 3    Sig: Apply 2 g topically 4 (four) times daily as needed. To knee arthritis pain      Analgesics:  Topicals Passed - 06/29/2020  2:24 PM      Passed - Valid encounter within last 12 months    Recent Outpatient Visits           1 month ago Gross hematuria   [place], DO   4 months ago Abscess   [facility] [name], NP   4 months ago Abscess   [facility] [name], NP   5 months ago Bilateral lower extremity edema   [place], DO   7 months ago Essential hypertension   [place], Devonne Doughty, DO       Future Appointments             In 1 week [place], Aaron Edelman, MD [facility], [place]   In 1 year [place], Herbert Seta, MD [place]

## 2020-06-29 NOTE — Progress Notes (Signed)
   06/29/2020 9:33 AM   [name] Dec 29, 1934 [id number]  Reason for visit: Follow up BPH and retention status post HOLEP, prostate cancer  HPI: Mr. Parkerson is a 86 year old male with number of comorbidities including Lewy body dementia who originally presented to me in the fall 2020 with Foley dependent urinary retention.  He underwent a HOLEP with removal of 33 g of tissue showing <5% involvement with prostate adenocarcinoma Gleason score 4+3=7.  PSA preop was 2.7, and postop was 0.5.  Repeat PSA June 2021 was stable at 0.5 he continues to do well with urination and really denies any complaint.  He had 1 episode of urge incontinence, but he drinks primarily sodas during the day.  Behavioral strategies discussed.  Overall he is very pleased with his urinary symptoms at this time.   It sounds like he had 1 UTI in April 2022 with Enterococcus and was treated with antibiotics.  He had some gross hematuria at that time that resolved after antibiotics.  History is challenging to obtain, and it is unclear what if any urinary symptoms he was having at that time.  Consider cranberry tablets for UTI prophylaxis Call with PSA results RTC 1 year PVR   [name], [place] [address], [place], [place] [address] [phone]

## 2020-06-30 ENCOUNTER — Telehealth: Payer: Self-pay

## 2020-06-30 LAB — PSA: Prostate Specific Ag, Serum: 0.6 ng/mL (ref 0.0–4.0)

## 2020-06-30 NOTE — Telephone Encounter (Signed)
May need PA Submitted for Diclofenac topical gel  Or if does not meet criteria, he could purchase OTC Voltaren, same product essentially.  [name], [facility] 06/30/2020, 12:15 PM

## 2020-06-30 NOTE — Telephone Encounter (Signed)
Called pt informed daughter of the information below per DPR.

## 2020-06-30 NOTE — Telephone Encounter (Signed)
-----   Message from [name], MD sent at 06/30/2020  7:26 AM EDT ----- Good news, PSA remains very low, rtc 1 year PSA/PVR. Thanks  [name], MD 06/30/2020

## 2020-07-03 ENCOUNTER — Ambulatory Visit (INDEPENDENT_AMBULATORY_CARE_PROVIDER_SITE_OTHER): Payer: Medicare Other | Admitting: Pharmacist

## 2020-07-03 DIAGNOSIS — G3183 Dementia with Lewy bodies: Secondary | ICD-10-CM | POA: Diagnosis not present

## 2020-07-03 DIAGNOSIS — F0280 Dementia in other diseases classified elsewhere without behavioral disturbance: Secondary | ICD-10-CM | POA: Diagnosis not present

## 2020-07-03 DIAGNOSIS — I10 Essential (primary) hypertension: Secondary | ICD-10-CM

## 2020-07-03 NOTE — Chronic Care Management (AMB) (Signed)
Chronic Care Management Pharmacy Note  07/03/2020 Name:  Randy [name] MRN:  [id number] DOB:  May 24, 1934  Subjective: Randy [name] is an 86 y.o. year old male who is a primary patient of [name], DO.  The CCM team was consulted for assistance with disease management and care coordination needs.    Engaged with patient's daughter by telephone for follow up visit in response to provider referral for pharmacy case management and/or care coordination services.   Consent to Services:  The patient was given information about Chronic Care Management services, agreed to services, and gave verbal consent prior to initiation of services.  Please see initial visit note for detailed documentation.   Patient Care Team: [name], DO as PCP - General (Family Medicine) Kate Sable, MD as PCP - Cardiology (Cardiology) Mekaila Tarnow, Virl Diamond, Snyder as Pharmacist Hall Busing [name], RN as Case Manager Rebekah Chesterfield, LCSW as Social Worker (Licensed Clinical Social Worker) Tamala Julian, Jonette Eva, NP as Nurse Practitioner (Hospice and [place]) Vladimir Crofts, MD as Consulting Physician (Neurology)  Recent consult visits: Office Visit with Cardiology on 5/23 as referred by PCP for chest pressure and HTN.  Home Visit with [facility] on 6/7 Office Visit with [facility] on 6/16.  Office Visit with [facility] on 6/23.   Hospital visits: ED Visit at [facility] on 5/3 for Chest pain  Objective:  Lab Results  Component Value Date   CREATININE 0.98 05/09/2020   CREATININE 0.83 10/12/2019   CREATININE 0.71 10/11/2019   Social History   Tobacco Use  Smoking Status Former   Packs/day: 1.00   Years: 12.00   Pack years: 12.00   Types: Cigarettes   Quit date: 1968   Years since quitting: 54.5  Smokeless Tobacco Never   BP Readings from Last 3 Encounters:  06/29/20 136/81   05/29/20 138/80  05/09/20 (!) 139/91   Pulse Readings from Last 3 Encounters:  06/29/20 76  05/29/20 74  05/09/20 79   Wt Readings from Last 3 Encounters:  06/29/20 216 lb (98 kg)  05/29/20 220 lb (99.8 kg)  02/15/20 228 lb (103.4 kg)    Assessment: Review of patient past medical history, allergies, medications, health status, including review of consultants reports, laboratory and other test data, was performed as part of comprehensive evaluation and provision of chronic care management services.   SDOH:  (Social Determinants of Health) assessments and interventions performed: none   CCM Care Plan  Allergies  Allergen Reactions   Antihistamines, Chlorpheniramine-Type Other (See Comments)    Can't urinate    Medications Reviewed Today     Reviewed by Vella Raring, RPH-CPP (Pharmacist) on 07/03/20 at [facility] List Status: <None>   Medication Order Taking? Sig Documenting Provider Last Dose Status Informant  acetaminophen (TYLENOL) 500 MG tablet [id number] Yes Take 1,000 mg by mouth every 6 (six) hours as needed. [provider] Taking Active   carbidopa-levodopa (SINEMET CR) 50-200 MG tablet [id number] Yes Take 1 tablet by mouth 4 (four) times daily. [provider] Taking Active   cholecalciferol (VITAMIN D) 1000 units tablet [id number] Yes Take 1,000 Units by mouth daily. [provider] Taking Active Other  Coenzyme Q10 (CO Q 10) 100 MG CAPS [id number] Yes Take 100 mg by mouth daily.  [provider] Taking Active Other  diclofenac Sodium (VOLTAREN) 1 % GEL [id number] Yes Apply 2 g topically 4 (four) times daily as needed.  To knee arthritis pain [name], DO Taking Active Pharmacy Records  enalapril (VASOTEC) 10 MG tablet [id number] Yes Take 1 tablet (10 mg total) by mouth daily. [name], DO Taking Active   fluticasone (FLONASE) 50 MCG/ACT nasal spray [id number] Yes Place 2 sprays into both nostrils daily  as needed for allergies.  [provider] Taking Active Other           Med Note [name], [name]   Mon Oct 26, 2018  1:07 PM)    furosemide (LASIX) 20 MG tablet [id number] Yes Take 1 tablet (20 mg total) by mouth every other day. [name], DO Taking Active Pharmacy Records           Med Note Rowe Pavy May 29, 2020  9:30 AM)    melatonin 5 MG TABS [id number] No Take 10 mg by mouth at bedtime as needed (sleep).  Patient not taking: Reported on 07/03/2020   [provider] Not Taking Active Other  Multiple Vitamin (MULTIVITAMIN WITH MINERALS) TABS tablet [id number] Yes Take 1 tablet by mouth daily. [name], MD Taking Active   Omega-3 Fatty Acids (FISH OIL) 1200 MG CAPS [id number] Yes Take 1,200 mg by mouth 2 (two) times daily.  [provider] Taking Active Other  omeprazole (PRILOSEC) 20 MG capsule [id number] Yes Take 20 mg by mouth daily. [provider] Taking Active Other  rivastigmine (EXELON) 1.5 MG capsule [id number] Yes Take 1 capsule by mouth 2 (two) times daily with a meal. [provider] Taking Active   sertraline (ZOLOFT) 50 MG tablet [id number] Yes Take 50 mg by mouth at bedtime. [provider] Taking Active   traMADol (ULTRAM) 50 MG tablet [id number] Yes Take 1 tablet (50 mg total) by mouth every 8 (eight) hours as needed. [name], DO Taking Active   vitamin C (ASCORBIC ACID) 500 MG tablet [id number] Yes Take 500 mg by mouth daily. [provider] Taking Active Other  zinc sulfate 220 (50 Zn) MG capsule [id number] Yes Take 1 capsule (220 mg total) by mouth daily. [name], MD Taking Active             Patient Active Problem List   Diagnosis Date Noted   Bilateral primary osteoarthritis of knee 02/15/2020   Pressure injury of buttock, stage 1    Delirium    COVID-19 virus infection 10/08/2019   Unable to care for self 10/08/2019   Caregiver unable to cope  10/08/2019   Recurrent falls 10/08/2019   Lewy body dementia without behavioral disturbance ([place])    Prostate cancer ([place]) 06/30/2019   BPH with urinary obstruction 11/06/2018   Acute cholecystitis 09/08/2018   Myalgia due to statin 02/24/2018   Tricompartment osteoarthritis of left knee 11/14/2017   Generalized weakness 04/28/2017   Chronic low back pain 03/12/2017   Knee pain, chronic 10/01/2016   Chronic venous insufficiency 08/30/2016   BPH without obstruction/lower urinary tract symptoms 03/06/2016   Osteoarthritis of multiple joints 03/06/2016   Degenerative joint disease (DJD) of lumbar spine 03/06/2016   Essential hypertension 03/05/2016   Hyperlipidemia 03/05/2016   Osteoporosis 03/05/2016   GERD (gastroesophageal reflux disease) 03/05/2016   Bilateral lower extremity edema 03/05/2016    Immunization History  Administered Date(s) Administered   Fluad Quad(high Dose 65+) 11/07/2018, 11/10/2019   Influenza, High Dose Seasonal PF 10/01/2016, 10/07/2017   Pneumococcal Conjugate-13 09/11/2016   Pneumococcal Polysaccharide-23 10/31/2017   Tdap 03/05/2016    Conditions  to be addressed/monitored: HTN, dementia  Care Plan : PharmD - Medication Managment  Updates made by Vella Raring, RPH-CPP since 07/03/2020 12:00 AM     Problem: Disease Progression      Long-Range Goal: Disease Progression Prevented or Minimized   Start Date: 01/19/2020  Expected End Date: 04/18/2020  This Visit's Progress: On track  Recent Progress: On track  Priority: High  Note:   Current Barriers:  Chronic Disease Management support, education, and care coordination needs related to hypertension, osteoarthritis, hyperlipidemia, insomnia and memory loss Cognitive Deficits  Pharmacist Clinical Goal(s):  Over the next 90 days, caregiver/patient will adhere to plan to optimize therapeutic regimen for hypertension as evidenced by report of adherence to recommended medication management changes  through collaboration with PharmD and provider.   Interventions: 1:1 collaboration with [name], DO regarding development and update of comprehensive plan of care as evidenced by provider attestation and co-signature Inter-disciplinary care team collaboration (see longitudinal plan of care) Perform chart review  Patient seen for Office Visit with Cardiology on 5/23 as referred by PCP for chest pressure and HTN. Provider ordered labs, echocardiogram and stress test with nuclear imaging Patient scheduled for testing with nuclear imaging on 5/31, but unable to complete testing due to claustrophobia and pain. Procedures now scheduled for 6/28 and 6/30 and follow up appointment with Cardiologist on 7/5 Home Visit with [facility] on 6/7 Office Visit with [facility] Neurology on 6/16. Provider advised patient: Continue rivastigmine 1.5 mg two times a day with meals Change Sinemet to Sinemet CR 50/200, take 1 tablet four times daily In 2 weeks start sertraline 50 mg tablet- 1 tablet every morning to help with mood Ambulatory Referral placed for speech therapy for hypophonia Office Visit with [facility] on 6/23. Patient advised to Consider cranberry tablets for UTI prophylaxis Comprehensive medication review performed; medication list updated in electronic medical record Daughter reports switched patient from immediate release Sinemet to Sinemet CR as directed by Neurology. Reports also started patient on sertraline, but denies having waiting 2 weeks in between Reports planning to give patient the dose of alprazolam 0.5 mg about 30 minutes prior to the procedure with Radiology tomorrow following direction from Cardiology  Hypertension Patient now followed by [facility] Cardiology for chest pain and HTN Reports patient taking: Enalapril 10 mg daily in the morning Furosemide 20 mg on Mondays, Wednesdays and Fridays Denies home BP monitoring  recently Encourage caregiver to monitor home blood pressure, keep log of results and to bring this record to upcoming appointment with Cardiology  Medication Adherence Note patient using weekly pillbox with slots for four times daily dosing Reports patient conitnues to need assistance from family for remembering four times/day dosing Have discussed with caregiver additional strategies for using medication alarm as adherence aid  Reports that she and her mom manage patient's as needed medications, including tramadol  Patient Goals/Self-Care Activities Over the next 90 days, caregiver/patient will:  - take medications as prescribed - check blood pressure, document, and provide at future appointments  Follow Up Plan: Telephone follow up appointment with care management team member scheduled for: 7/25 at 8:30 am      Patient's preferred pharmacy is:  [place], [place] - [address] [place] [address] Phone: [phone] Fax: [phone]  [id number] - [place], [place] - [address] [address] Phone: [phone] Fax: [phone]  Uses pill box?  Yes  Follow Up:  Caregiver agrees to Care Plan and Follow-up.  [name], PharmD, [place], CPP Clinical Pharmacist [facility] [phone]

## 2020-07-03 NOTE — Patient Instructions (Signed)
Visit Information  PATIENT GOALS:  Goals Addressed             This Visit's Progress    Pharmacy Goals       Please continue to monitor home blood pressure and keep log of results and bring record to upcoming appointment with Cardiology   Feel free to call me with any questions or concerns. I look forward to our next call!   [name], PharmD, [place] [phone]         Caregiver verbalized understanding of instructions, educational materials, and care plan provided today and declined offer to receive copy of patient instructions, educational materials, and care plan.   Telephone follow up appointment with care management team member scheduled for: 7/25 at 8:30 am

## 2020-07-04 ENCOUNTER — Other Ambulatory Visit: Payer: Self-pay

## 2020-07-04 ENCOUNTER — Encounter
Admission: RE | Admit: 2020-07-04 | Discharge: 2020-07-04 | Disposition: A | Discharge status: Home / Self Care | Payer: Medicare Other | Source: Ambulatory Visit | Attending: Cardiology | Admitting: Cardiology

## 2020-07-04 DIAGNOSIS — R072 Precordial pain: Secondary | ICD-10-CM

## 2020-07-04 DIAGNOSIS — I700 Atherosclerosis of aorta: Secondary | ICD-10-CM | POA: Insufficient documentation

## 2020-07-04 DIAGNOSIS — I2510 Atherosclerotic heart disease of native coronary artery without angina pectoris: Secondary | ICD-10-CM | POA: Diagnosis not present

## 2020-07-04 LAB — NM MYOCAR MULTI W/SPECT W/WALL MOTION / EF
LV dias vol: 134 mL (ref 62–150)
LV sys vol: 51 mL
Peak HR: 78 {beats}/min
Percent HR: 57 %
Rest HR: 61 {beats}/min
TID: 0.94

## 2020-07-04 MED ORDER — REGADENOSON 0.4 MG/5ML IV SOLN
0.4000 mg | Freq: Once | INTRAVENOUS | Status: AC
  Administered 2020-07-04: 0.4 mg via INTRAVENOUS
  Filled 2020-07-04: qty 5

## 2020-07-04 MED ORDER — TECHNETIUM TC 99M TETROFOSMIN IV KIT
30.0000 | PACK | Freq: Once | INTRAVENOUS | Status: AC | PRN
  Administered 2020-07-04: 30.17 via INTRAVENOUS

## 2020-07-04 MED ORDER — TECHNETIUM TC 99M TETROFOSMIN IV KIT
10.9300 | PACK | Freq: Once | INTRAVENOUS | Status: AC | PRN
  Administered 2020-07-04: 10.93 via INTRAVENOUS

## 2020-07-06 ENCOUNTER — Other Ambulatory Visit: Payer: Self-pay

## 2020-07-06 ENCOUNTER — Ambulatory Visit (INDEPENDENT_AMBULATORY_CARE_PROVIDER_SITE_OTHER): Payer: Medicare Other

## 2020-07-06 DIAGNOSIS — R072 Precordial pain: Secondary | ICD-10-CM

## 2020-07-06 LAB — ECHOCARDIOGRAM COMPLETE
AR max vel: 3.47 cm2
AV Area VTI: 3.35 cm2
AV Area mean vel: 3.06 cm2
AV Mean grad: 7 mmHg
AV Peak grad: 10.1 mmHg
Ao pk vel: 1.59 m/s
Area-P 1/2: 2.78 cm2
Calc EF: 52.9 %
P 1/2 time: 598 msec
S' Lateral: 3.4 cm
Single Plane A2C EF: 54.4 %
Single Plane A4C EF: 53.4 %

## 2020-07-11 ENCOUNTER — Encounter: Payer: Self-pay | Admitting: Cardiology

## 2020-07-11 ENCOUNTER — Ambulatory Visit (INDEPENDENT_AMBULATORY_CARE_PROVIDER_SITE_OTHER): Payer: Medicare Other | Admitting: Cardiology

## 2020-07-11 ENCOUNTER — Other Ambulatory Visit: Payer: Self-pay

## 2020-07-11 VITALS — BP 130/70 | HR 68 | Ht 72.0 in | Wt 220.0 lb

## 2020-07-11 DIAGNOSIS — I10 Essential (primary) hypertension: Secondary | ICD-10-CM | POA: Diagnosis not present

## 2020-07-11 DIAGNOSIS — R072 Precordial pain: Secondary | ICD-10-CM

## 2020-07-11 DIAGNOSIS — R600 Localized edema: Secondary | ICD-10-CM

## 2020-07-11 DIAGNOSIS — E7800 Pure hypercholesterolemia, unspecified: Secondary | ICD-10-CM | POA: Diagnosis not present

## 2020-07-11 MED ORDER — FUROSEMIDE 20 MG PO TABS: 20.0000 mg | ORAL_TABLET | Freq: Every day | ORAL | 2 refills | Status: DC

## 2020-07-11 NOTE — Patient Instructions (Signed)
Medication Instructions:   Your physician has recommended you make the following change in your medication:   Take your Lasix 20 MG once every day.   *If you need a refill on your cardiac medications before your next appointment, please call your pharmacy*   Lab Work:  Your physician recommends that you return for lab work in: [place] (07/17/20)  - You will need to be fasting. Please do not have anything to eat or drink after midnight the morning you have the lab work. You may only have water or black coffee with no cream or sugar.  - Please go to the [facility]. You will check in at the front desk to the right as you walk into the atrium. Valet Parking is offered if needed. - No appointment needed. You may go any day between 7 am and 6 pm.   Testing/Procedures: None ordered   Follow-Up: At [facility], you and your health needs are our priority.  As part of our continuing mission to provide you with exceptional heart care, we have created designated Provider Care Teams.  These Care Teams include your primary Cardiologist (physician) and Advanced Practice Providers (APPs -  Physician Assistants and Nurse Practitioners) who all work together to provide you with the care you need, when you need it.  We recommend signing up for the patient portal called "MyChart".  Sign up information is provided on this After Visit Summary.  MyChart is used to connect with patients for Virtual Visits (Telemedicine).  Patients are able to view lab/test results, encounter notes, upcoming appointments, etc.  Non-urgent messages can be sent to your provider as well.   To learn more about what you can do with MyChart, go to [email or web address].    Your next appointment:   4-6 week(s)  The format for your next appointment:   In Person  Provider:   Kate Sable, MD   Other Instructions

## 2020-07-11 NOTE — Progress Notes (Signed)
Cardiology Office Note:    Date:  07/11/2020   ID:  Randy, [name] [birth year], MRN [id number]  PCP:  [name], DO   [place] Providers Cardiologist:  Kate Sable, MD     Referring MD: [name] *   Chief Complaint  Patient presents with   Other    Follow up post Echo and myoview. Patient c.o swelling in ankles. Meds reviewed verbally with patient.     History of Present Illness:    Randy [name] is a 86 y.o. male with a hx of hypertension, hyperlipidemia, Lewy body dementia presenting for follow-up.  Last seen due to chest pain.  Due to risk factors, echocardiogram and Lexiscan Myoview were ordered to evaluate cardiac function.  Lower extremity edema also noted.  Currently takes Lasix every other day, swelling is not improving.  States his symptoms of chest discomfort have improved.  Edema is still present.  His breathing is okay.    Past Medical History:  Diagnosis Date   Arthritis    GERD (gastroesophageal reflux disease)    Hyperlipidemia    Hypertension    Lewy body dementia ([place])    Osteoporosis    Prostate enlargement     Past Surgical History:  Procedure Laterality Date   CHOLECYSTECTOMY N/A 09/10/2018   Procedure: LAPAROSCOPIC CHOLECYSTECTOMY;  Surgeon: Benjamine Sprague, DO;  Location: [facility] ORS;  Service: General;  Laterality: N/A;   GANGLION CYST EXCISION Left 2013   Left upper arm   HERNIA REPAIR N/A 2008   [place], ventral acquired hernia repair   HOLEP-LASER ENUCLEATION OF THE PROSTATE WITH MORCELLATION N/A 11/06/2018   Procedure: HOLEP-LASER ENUCLEATION OF THE PROSTATE WITH MORCELLATION;  Surgeon: [name], MD;  Location: [facility] ORS;  Service: Urology;  Laterality: N/A;    Current Medications: Current Meds  Medication Sig   acetaminophen (TYLENOL) 500 MG tablet Take 1,000 mg by mouth every 6 (six) hours as needed.   carbidopa-levodopa (SINEMET CR) 50-200 MG tablet Take 1 tablet by mouth 4 (four) times daily.    cholecalciferol (VITAMIN D) 1000 units tablet Take 1,000 Units by mouth daily.   Coenzyme Q10 (CO Q 10) 100 MG CAPS Take 100 mg by mouth daily.    diclofenac Sodium (VOLTAREN) 1 % GEL Apply 2 g topically 4 (four) times daily as needed. To knee arthritis pain   enalapril (VASOTEC) 10 MG tablet Take 1 tablet (10 mg total) by mouth daily.   fluticasone (FLONASE) 50 MCG/ACT nasal spray Place 2 sprays into both nostrils daily as needed for allergies.    melatonin 5 MG TABS Take 10 mg by mouth at bedtime as needed (sleep).   Multiple Vitamin (MULTIVITAMIN WITH MINERALS) TABS tablet Take 1 tablet by mouth daily.   Omega-3 Fatty Acids (FISH OIL) 1200 MG CAPS Take 1,200 mg by mouth 2 (two) times daily.    omeprazole (PRILOSEC) 20 MG capsule Take 20 mg by mouth daily.   rivastigmine (EXELON) 1.5 MG capsule Take 1 capsule by mouth 2 (two) times daily with a meal.   sertraline (ZOLOFT) 50 MG tablet Take 50 mg by mouth in the morning.   traMADol (ULTRAM) 50 MG tablet Take 1 tablet (50 mg total) by mouth every 8 (eight) hours as needed.   vitamin C (ASCORBIC ACID) 500 MG tablet Take 500 mg by mouth daily.   zinc sulfate 220 (50 Zn) MG capsule Take 1 capsule (220 mg total) by mouth daily.   [DISCONTINUED] furosemide (LASIX) 20 MG  tablet Take 1 tablet (20 mg total) by mouth every other day.     Allergies:   Antihistamines, chlorpheniramine-type   Social History   Socioeconomic History   Marital status: Married    Spouse name: Not on file   Number of children: Not on file   Years of education: College   Highest education level: Not on file  Occupational History   Occupation: Retired Clinical biochemist  Tobacco Use   Smoking status: Former    Packs/day: 1.00    Years: 12.00    Pack years: 12.00    Types: Cigarettes    Quit date: 1968    Years since quitting: 54.5   Smokeless tobacco: Never  Vaping Use   Vaping Use: Never used  Substance and Sexual Activity   Alcohol use: No   Drug use: No   Sexual  activity: Not on file  Other Topics Concern   Not on file  Social History Narrative   Not on file   Social Determinants of Health   Financial Resource Strain: Not on file  Food Insecurity: Not on file  Transportation Needs: Not on file  Physical Activity: Not on file  Stress: Not on file  Social Connections: Not on file     Family History: The patient's family history includes Breast cancer in his mother; Heart disease in his brother; Leukemia in his brother; Lung cancer in his father. There is no history of Prostate cancer, Colon cancer, Bladder Cancer, or Kidney cancer.  ROS:   Please see the history of present illness.     All other systems reviewed and are negative.  EKGs/Labs/Other Studies Reviewed:    The following studies were reviewed today:   EKG:  EKG not ordered today.   Recent Labs: 05/09/2020: ALT 5; B Natriuretic Peptide 211.3; BUN 28; Creatinine, Ser 0.98; Hemoglobin 12.1; Platelets 192; Potassium 3.5; Sodium 138  Recent Lipid Panel    Component Value Date/Time   CHOL 176 06/10/2018 0800   CHOL 151 04/17/2015 0826   TRIG 98 06/10/2018 0800   TRIG 116 04/17/2015 0826   HDL 35 (L) 06/10/2018 0800   HDL 32 (A) 04/17/2015 0826   CHOLHDL 5.0 (H) 06/10/2018 0800   VLDL 27 05/27/2016 0856   LDLCALC 121 (H) 06/10/2018 0800     Risk Assessment/Calculations:      Physical Exam:    VS:  BP 130/70 (BP Location: Right Arm, Patient Position: Sitting, Cuff Size: Normal)   Pulse 68   Ht 6' (1.829 m)   Wt 220 lb (99.8 kg)   SpO2 97%   BMI 29.84 kg/m     Wt Readings from Last 3 Encounters:  07/11/20 220 lb (99.8 kg)  06/29/20 216 lb (98 kg)  05/29/20 220 lb (99.8 kg)     GEN:  Well nourished, well developed in no acute distress HEENT: Normal NECK: No JVD; No carotid bruits LYMPHATICS: No lymphadenopathy CARDIAC: RRR, no murmurs, rubs, gallops RESPIRATORY:  Clear anteriorly, diminished breath sounds at bases ABDOMEN: Soft, non-tender,  non-distended MUSCULOSKELETAL:  1+ nonpitting edema; No deformity  SKIN: Warm and dry NEUROLOGIC:  Alert and oriented x 3 PSYCHIATRIC:  Normal affect   ASSESSMENT:    1. Precordial pain   2. Primary hypertension   3. Pure hypercholesterolemia   4. Bilateral lower extremity edema     PLAN:    In order of problems listed above:  Patient with chest pain, risk factors hypertension, hyperlipidemia.  Echo showed normal systolic function, EF 55  to 60%, impaired relaxation, Lexiscan Myoview with no evidence for ischemia.  Coronary artery calcifications noted on CT attenuated images.  Recommend aspirin 81 mg daily, recommend obtaining fasting lipid profile.  Start aspirin 81 mg daily, plan to start Lipitor based on lipid panel. Hypertension, BP controlled.  Continue current BP meds. Hyperlipidemia, obtain repeat fasting lipid profile.  Dose Lipitor as per lipid panel. Lower extremity edema, unsure if this is secondary to venous insufficiency.  Increase Lasix to 20 mg daily.  Check BMP in 1 week.  Follow-up in 6 weeks   Medication Adjustments/Labs and Tests Ordered: Current medicines are reviewed at length with the patient today.  Concerns regarding medicines are outlined above.  Orders Placed This Encounter  Procedures   Basic metabolic panel   Lipid panel    Meds ordered this encounter  Medications   furosemide (LASIX) 20 MG tablet    Sig: Take 1 tablet (20 mg total) by mouth daily.    Dispense:  45 tablet    Refill:  2     Patient Instructions  Medication Instructions:   Your physician has recommended you make the following change in your medication:   Take your Lasix 20 MG once every day.   *If you need a refill on your cardiac medications before your next appointment, please call your pharmacy*   Lab Work:  Your physician recommends that you return for lab work in: [place] (07/17/20)  - You will need to be fasting. Please do not have anything to eat or drink  after midnight the morning you have the lab work. You may only have water or black coffee with no cream or sugar.  - Please go to the [facility]. You will check in at the front desk to the right as you walk into the atrium. Valet Parking is offered if needed. - No appointment needed. You may go any day between 7 am and 6 pm.   Testing/Procedures: None ordered   Follow-Up: At [facility], you and your health needs are our priority.  As part of our continuing mission to provide you with exceptional heart care, we have created designated Provider Care Teams.  These Care Teams include your primary Cardiologist (physician) and Advanced Practice Providers (APPs -  Physician Assistants and Nurse Practitioners) who all work together to provide you with the care you need, when you need it.  We recommend signing up for the patient portal called "MyChart".  Sign up information is provided on this After Visit Summary.  MyChart is used to connect with patients for Virtual Visits (Telemedicine).  Patients are able to view lab/test results, encounter notes, upcoming appointments, etc.  Non-urgent messages can be sent to your provider as well.   To learn more about what you can do with MyChart, go to [email or web address].    Your next appointment:   4-6 week(s)  The format for your next appointment:   In Person  Provider:   Kate Sable, MD   Other Instructions    Signed, Kate Sable, MD  07/11/2020 10:36 AM    [place]

## 2020-07-17 ENCOUNTER — Other Ambulatory Visit
Admission: RE | Admit: 2020-07-17 | Discharge: 2020-07-17 | Disposition: A | Discharge status: Home / Self Care | Payer: Medicare Other | Attending: Cardiology | Admitting: Cardiology

## 2020-07-17 DIAGNOSIS — R600 Localized edema: Secondary | ICD-10-CM | POA: Diagnosis present

## 2020-07-17 DIAGNOSIS — I10 Essential (primary) hypertension: Secondary | ICD-10-CM | POA: Insufficient documentation

## 2020-07-17 DIAGNOSIS — E7800 Pure hypercholesterolemia, unspecified: Secondary | ICD-10-CM | POA: Diagnosis present

## 2020-07-17 LAB — LIPID PANEL
Cholesterol: 156 mg/dL (ref 0–200)
HDL: 37 mg/dL — ABNORMAL LOW (ref >40)
LDL Cholesterol: 105 mg/dL — ABNORMAL HIGH (ref 0–99)
Total CHOL/HDL Ratio: 4.2 RATIO
Triglycerides: 71 mg/dL (ref <150)
VLDL: 14 mg/dL (ref 0–40)

## 2020-07-17 LAB — BASIC METABOLIC PANEL
Anion gap: 8 (ref 5–15)
BUN: 25 mg/dL — ABNORMAL HIGH (ref 8–23)
CO2: 29 mmol/L (ref 22–32)
Calcium: 8.7 mg/dL — ABNORMAL LOW (ref 8.9–10.3)
Chloride: 101 mmol/L (ref 98–111)
Creatinine, Ser: 0.9 mg/dL (ref 0.61–1.24)
GFR, Estimated: >60 mL/min (ref >60)
Glucose, Bld: 96 mg/dL (ref 70–99)
Potassium: 3.4 mmol/L — ABNORMAL LOW (ref 3.5–5.1)
Sodium: 138 mmol/L (ref 135–145)

## 2020-07-18 ENCOUNTER — Telehealth: Payer: Self-pay

## 2020-07-18 DIAGNOSIS — R600 Localized edema: Secondary | ICD-10-CM

## 2020-07-18 MED ORDER — FUROSEMIDE 20 MG PO TABS: 20.0000 mg | ORAL_TABLET | ORAL | 2 refills | Status: DC

## 2020-07-18 NOTE — Telephone Encounter (Signed)
Called and spoke with patients wife per DPR on file and gave the lab result note. Patients wife verbalized understanding and agreed with plan.

## 2020-07-18 NOTE — Telephone Encounter (Signed)
-----   Message from Kate Sable, MD sent at 07/17/2020  2:01 PM EDT ----- Cholesterol levels slightly abnormal, monitor levels of cholesterol, low-cholesterol diet reasonable.  Potassium is slightly reduced.  Please advise patient to reduce dose of Lasix to every other day.

## 2020-07-24 ENCOUNTER — Other Ambulatory Visit: Payer: Medicare Other | Admitting: Primary Care

## 2020-07-24 ENCOUNTER — Other Ambulatory Visit: Payer: Self-pay

## 2020-07-24 DIAGNOSIS — R600 Localized edema: Secondary | ICD-10-CM

## 2020-07-24 DIAGNOSIS — Z515 Encounter for palliative care: Secondary | ICD-10-CM

## 2020-07-24 DIAGNOSIS — F0280 Dementia in other diseases classified elsewhere without behavioral disturbance: Secondary | ICD-10-CM

## 2020-07-24 DIAGNOSIS — G3183 Dementia with Lewy bodies: Secondary | ICD-10-CM

## 2020-07-24 NOTE — Progress Notes (Signed)
[facility] Palliative Care Consult Note Telephone: [phone]  Fax: [phone]    Date of encounter: 07/24/20 PATIENT NAME: Randy [name] [address] [place] [address]   [phone] (home)  DOB: 1934-01-27 MRN: [id number] PRIMARY CARE PROVIDER:    [name], DO,  [place] [address] [phone]  REFERRING PROVIDER:   [name], DO [address] [place],  [place] [address] [phone]  RESPONSIBLE PARTY:    Contact Information     Name Relation Home Work Mobile   Randy [name],Randy [name] Daughter   [phone]   Randy [name], Randy [name] [phone]          I met face to face with patient and family in   their home. Palliative Care was asked to follow this patient by consultation request of  Randy [name] * to address advance care planning and complex medical decision making. This is a follow up visit.                                   ASSESSMENT AND PLAN / RECOMMENDATIONS:   Advance Care Planning/Goals of Care: Goals include to maximize quality of life and symptom management. Our advance care planning conversation included a discussion about:    The value and importance of advance care planning  Exploration of personal, cultural or spiritual beliefs that might influence medical decisions  Exploration of goals of care in the event of a sudden injury or illness Review of an  advance directive document . CODE STATUS: FULL Code I met with patient his wife and daughter in their home. We reviewed the plan from last visit. They did not remember discussing the MOST form. We reviewed that again,  discussing levels of medical intervention and desired goals for medical treatment. Wife felt patient needed some more time to discuss and so I left the form and 5 wishes booklet for them to discuss. We can review on our next visit.   Symptom Management/Plan: Pain control patient voices MSK pain. I had recommended  Tylenol arthritis on our last visit. Daughter has not yet obtained and so will try this. I recommend two tablets every eight hours for knee pain, which is chronic.   Edema: he is currently taking 20 mg of Lasix every other day and no potassium supplement. Recent BNP showed potassium to be 3.4. Patient does not put his feet up nor do they use compression. We did discuss elevating his feet in his recliner. I suggested them purchasing  a zipper light to medium compression stocking off [employer]. His feet are compressed by his shoes and could lead to poorly healing wounds or clumsiness and falls. I asked him to try non-pharmacological modalities first and we can reassess or they can discuss with cardiology.   Nutrition we discussed recent subscription to a Meals on Wheels type service through a local church. Daughter states they are not always a favorite but that it is helpful to have the meals more easily prepared. They do not put added salt which is helpful for his edema.  Follow up Palliative Care Visit: Palliative care will continue to follow for complex medical decision making, advance care planning, and clarification of goals. Return 6 weeks or prn.  I spent 60 minutes providing this consultation. More than 50% of the time in this consultation was spent in counseling and care coordination.  PPS: 40%  HOSPICE ELIGIBILITY/DIAGNOSIS:  TBD  Chief Complaint: dementia, edema  HISTORY OF PRESENT ILLNESS:  Randy [name] is a 86 y.o. year old male  with edema, dementia,  bil knee pain, back pain, low K baseline.  History obtained from review of EMR, discussion with primary team, and interview with family, facility staff/caregiver and/or Randy [name].  I reviewed available labs, medications, imaging, studies and related documents from the EMR.  Records reviewed and summarized above.   ROS  General: NAD ENMT: denies dysphagia Cardiovascular: denies chest pain, denies DOE Pulmonary: denies cough, denies  increased SOB Abdomen: endorses good appetite, denies constipation, endorses continence of bowel GU: denies dysuria, endorses continence of urine MSK:  endorses weakness,  no falls reported Skin: denies rashes or wounds Neurological: endorses knee pain, denies insomnia Psych: Endorses positive mood Heme/lymph/immuno: denies bruises, abnormal bleeding  Physical Exam: Current and past weights: unavailable Constitutional: NAD, 126/80 HR 67 RR 18  General: frail appearing WNWD EYES: anicteric sclera, lids intact, no discharge  ENMT: intact hearing, oral mucous membranes moist, dentition intact CV: S1S2, RRR, 3+ pitting bil  LE edema Pulmonary: LCTA, no increased work of breathing, no cough, room air, 98% Abdomen: intake 100%, normo-active BS + 4 quadrants, soft and non tender, no ascites GU: deferred MSK: no sarcopenia, moves all extremities, ambulatory with walker  Skin: warm and dry, no rashes or wounds on visible skin Neuro:  + generalized weakness,  + cognitive impairment Psych: non-anxious affect, A and O x 2-3 Hem/lymph/immuno: no widespread bruising   Thank you for the opportunity to participate in the care of Randy [name].  The palliative care team will continue to follow. Please call our office at [phone] if we can be of additional assistance.   Jason Coop, NP   COVID-19 PATIENT SCREENING TOOL Asked and negative response unless otherwise noted:   Have you had symptoms of covid, tested positive or been in contact with someone with symptoms/positive test in the past 5-10 days?

## 2020-07-25 ENCOUNTER — Ambulatory Visit (INDEPENDENT_AMBULATORY_CARE_PROVIDER_SITE_OTHER): Payer: Medicare Other

## 2020-07-25 VITALS — Ht 72.0 in | Wt 235.0 lb

## 2020-07-25 DIAGNOSIS — Z0000 Encounter for general adult medical examination without abnormal findings: Secondary | ICD-10-CM | POA: Diagnosis not present

## 2020-07-25 NOTE — Patient Instructions (Signed)
[name] , Thank you for taking time to come for your Medicare Wellness Visit. I appreciate your ongoing commitment to your health goals. Please review the following plan we discussed and let me know if I can assist you in the future.   Screening recommendations/referrals: Colonoscopy: not required Recommended yearly ophthalmology/optometry visit for glaucoma screening and checkup Recommended yearly dental visit for hygiene and checkup  Vaccinations: Influenza vaccine: completed 11/10/2019, due 08/07/2020 Pneumococcal vaccine: completed 10/31/2017 Tdap vaccine: completed 03/05/2016, due 03/05/2026 Shingles vaccine: discussed   Covid-19: decline  Advanced directives: Please bring a copy of your POA (Power of Attorney) and/or Living Will to your next appointment.   Conditions/risks identified: none  Next appointment: Follow up in one year for your annual wellness visit.   Preventive Care 44 Years and Older, Male Preventive care refers to lifestyle choices and visits with your health care provider that can promote health and wellness. What does preventive care include? A yearly physical exam. This is also called an annual well check. Dental exams once or twice a year. Routine eye exams. Ask your health care provider how often you should have your eyes checked. Personal lifestyle choices, including: Daily care of your teeth and gums. Regular physical activity. Eating a healthy diet. Avoiding tobacco and drug use. Limiting alcohol use. Practicing safe sex. Taking low doses of aspirin every day. Taking vitamin and mineral supplements as recommended by your health care provider. What happens during an annual well check? The services and screenings done by your health care provider during your annual well check will depend on your age, overall health, lifestyle risk factors, and family history of disease. Counseling  Your health care provider may ask you questions about your: Alcohol  use. Tobacco use. Drug use. Emotional well-being. Home and relationship well-being. Sexual activity. Eating habits. History of falls. Memory and ability to understand (cognition). Work and work Statistician. Screening  You may have the following tests or measurements: Height, weight, and BMI. Blood pressure. Lipid and cholesterol levels. These may be checked every 5 years, or more frequently if you are over 22 years old. Skin check. Lung cancer screening. You may have this screening every year starting at age 5 if you have a 30-pack-year history of smoking and currently smoke or have quit within the past 15 years. Fecal occult blood test (FOBT) of the stool. You may have this test every year starting at age 78. Flexible sigmoidoscopy or colonoscopy. You may have a sigmoidoscopy every 5 years or a colonoscopy every 10 years starting at age 27. Prostate cancer screening. Recommendations will vary depending on your family history and other risks. Hepatitis C blood test. Hepatitis B blood test. Sexually transmitted disease (STD) testing. Diabetes screening. This is done by checking your blood sugar (glucose) after you have not eaten for a while (fasting). You may have this done every 1-3 years. Abdominal aortic aneurysm (AAA) screening. You may need this if you are a current or former smoker. Osteoporosis. You may be screened starting at age 52 if you are at high risk. Talk with your health care provider about your test results, treatment options, and if necessary, the need for more tests. Vaccines  Your health care provider may recommend certain vaccines, such as: Influenza vaccine. This is recommended every year. Tetanus, diphtheria, and acellular pertussis (Tdap, Td) vaccine. You may need a Td booster every 10 years. Zoster vaccine. You may need this after age 6. Pneumococcal 13-valent conjugate (PCV13) vaccine. One dose is recommended after  age 60. Pneumococcal polysaccharide  (PPSV23) vaccine. One dose is recommended after age 66. Talk to your health care provider about which screenings and vaccines you need and how often you need them. This information is not intended to replace advice given to you by your health care provider. Make sure you discuss any questions you have with your health care provider. Document Released: 01/20/2015 Document Revised: 09/13/2015 Document Reviewed: 10/25/2014 Elsevier Interactive Patient Education  2017 [place] Prevention in the Home Falls can cause injuries. They can happen to people of all ages. There are many things you can do to make your home safe and to help prevent falls. What can I do on the outside of my home? Regularly fix the edges of walkways and driveways and fix any cracks. Remove anything that might make you trip as you walk through a door, such as a raised step or threshold. Trim any bushes or trees on the path to your home. Use bright outdoor lighting. Clear any walking paths of anything that might make someone trip, such as rocks or tools. Regularly check to see if handrails are loose or broken. Make sure that both sides of any steps have handrails. Any raised decks and porches should have guardrails on the edges. Have any leaves, snow, or ice cleared regularly. Use sand or salt on walking paths during winter. Clean up any spills in your garage right away. This includes oil or grease spills. What can I do in the bathroom? Use night lights. Install grab bars by the toilet and in the tub and shower. Do not use towel bars as grab bars. Use non-skid mats or decals in the tub or shower. If you need to sit down in the shower, use a plastic, non-slip stool. Keep the floor dry. Clean up any water that spills on the floor as soon as it happens. Remove soap buildup in the tub or shower regularly. Attach bath mats securely with double-sided non-slip rug tape. Do not have throw rugs and other things on the  floor that can make you trip. What can I do in the bedroom? Use night lights. Make sure that you have a light by your bed that is easy to reach. Do not use any sheets or blankets that are too big for your bed. They should not hang down onto the floor. Have a firm chair that has side arms. You can use this for support while you get dressed. Do not have throw rugs and other things on the floor that can make you trip. What can I do in the kitchen? Clean up any spills right away. Avoid walking on wet floors. Keep items that you use a lot in easy-to-reach places. If you need to reach something above you, use a strong step stool that has a grab bar. Keep electrical cords out of the way. Do not use floor polish or wax that makes floors slippery. If you must use wax, use non-skid floor wax. Do not have throw rugs and other things on the floor that can make you trip. What can I do with my stairs? Do not leave any items on the stairs. Make sure that there are handrails on both sides of the stairs and use them. Fix handrails that are broken or loose. Make sure that handrails are as long as the stairways. Check any carpeting to make sure that it is firmly attached to the stairs. Fix any carpet that is loose or worn. Avoid having throw rugs  at the top or bottom of the stairs. If you do have throw rugs, attach them to the floor with carpet tape. Make sure that you have a light switch at the top of the stairs and the bottom of the stairs. If you do not have them, ask someone to add them for you. What else can I do to help prevent falls? Wear shoes that: Do not have high heels. Have rubber bottoms. Are comfortable and fit you well. Are closed at the toe. Do not wear sandals. If you use a stepladder: Make sure that it is fully opened. Do not climb a closed stepladder. Make sure that both sides of the stepladder are locked into place. Ask someone to hold it for you, if possible. Clearly mark and make  sure that you can see: Any grab bars or handrails. First and last steps. Where the edge of each step is. Use tools that help you move around (mobility aids) if they are needed. These include: Canes. Walkers. Scooters. Crutches. Turn on the lights when you go into a dark area. Replace any light bulbs as soon as they burn out. Set up your furniture so you have a clear path. Avoid moving your furniture around. If any of your floors are uneven, fix them. If there are any pets around you, be aware of where they are. Review your medicines with your doctor. Some medicines can make you feel dizzy. This can increase your chance of falling. Ask your doctor what other things that you can do to help prevent falls. This information is not intended to replace advice given to you by your health care provider. Make sure you discuss any questions you have with your health care provider. Document Released: 10/20/2008 Document Revised: 06/01/2015 Document Reviewed: 01/28/2014 Elsevier Interactive Patient Education  2017 [employer].

## 2020-07-25 NOTE — Progress Notes (Signed)
I connected with Randy [name] today by telephone and verified that I am speaking with the correct person using two identifiers. Location patient: home Location provider: work Persons participating in the virtual visit: Randy [name], Odis Turck (wife), Glenna Durand LPN.   I discussed the limitations, risks, security and privacy concerns of performing an evaluation and management service by telephone and the availability of in person appointments. I also discussed with the patient that there may be a patient responsible charge related to this service. The patient expressed understanding and verbally consented to this telephonic visit.    Interactive audio and video telecommunications were attempted between this provider and patient, however failed, due to patient having technical difficulties OR patient did not have access to video capability.  We continued and completed visit with audio only.     Vital signs may be patient reported or missing.  Subjective:   Randy [name] is a 86 y.o. male who presents for Medicare Annual/Subsequent preventive examination.  Review of Systems     Cardiac Risk Factors include: advanced age (>48men, >2 women);hypertension;male gender;obesity (BMI >30kg/m2);sedentary lifestyle     Objective:    Today's Vitals   07/25/20 1437  Weight: 235 lb (106.6 kg)  Height: 6' (1.829 m)   Body mass index is 31.87 kg/m.  Advanced Directives 07/25/2020 05/09/2020 10/07/2019 10/04/2019 09/15/2019 03/02/2019 11/06/2018  Does Patient Have a Medical Advance Directive? Yes Yes No No No Yes Yes  Type of Paramedic of [place];Living will Living will;Healthcare Power of Attorney - - - Living will;Healthcare Power of Attorney Living will  Does patient want to make changes to medical advance directive? - No - Guardian declined - - - - No - Patient declined  Copy of [place] in Chart? No - copy requested No - copy requested - - - No - copy  requested -  Would patient like information on creating a medical advance directive? - - No - Patient declined No - Patient declined No - Patient declined - -    Current Medications (verified) Outpatient Encounter Medications as of 07/25/2020  Medication Sig   acetaminophen (TYLENOL) 500 MG tablet Take 1,000 mg by mouth every 6 (six) hours as needed.   carbidopa-levodopa (SINEMET CR) 50-200 MG tablet Take 1 tablet by mouth 4 (four) times daily.   cholecalciferol (VITAMIN D) 1000 units tablet Take 1,000 Units by mouth daily.   Coenzyme Q10 (CO Q 10) 100 MG CAPS Take 100 mg by mouth daily.    diclofenac Sodium (VOLTAREN) 1 % GEL Apply 2 g topically 4 (four) times daily as needed. To knee arthritis pain   enalapril (VASOTEC) 10 MG tablet Take 1 tablet (10 mg total) by mouth daily.   fluticasone (FLONASE) 50 MCG/ACT nasal spray Place 2 sprays into both nostrils daily as needed for allergies.    furosemide (LASIX) 20 MG tablet Take 1 tablet (20 mg total) by mouth every other day.   Multiple Vitamin (MULTIVITAMIN WITH MINERALS) TABS tablet Take 1 tablet by mouth daily.   Omega-3 Fatty Acids (FISH OIL) 1200 MG CAPS Take 1,200 mg by mouth 2 (two) times daily.    omeprazole (PRILOSEC) 20 MG capsule Take 20 mg by mouth daily.   rivastigmine (EXELON) 1.5 MG capsule Take 1 capsule by mouth 2 (two) times daily with a meal.   sertraline (ZOLOFT) 50 MG tablet Take 50 mg by mouth in the morning.   vitamin C (ASCORBIC ACID) 500 MG tablet Take 500  mg by mouth daily.   zinc sulfate 220 (50 Zn) MG capsule Take 1 capsule (220 mg total) by mouth daily.   melatonin 5 MG TABS Take 10 mg by mouth at bedtime as needed (sleep). (Patient not taking: Reported on 07/25/2020)   traMADol (ULTRAM) 50 MG tablet Take 1 tablet (50 mg total) by mouth every 8 (eight) hours as needed. (Patient not taking: Reported on 07/25/2020)   No facility-administered encounter medications on file as of 07/25/2020.    Allergies  (verified) Antihistamines, chlorpheniramine-type   History: Past Medical History:  Diagnosis Date   Arthritis    GERD (gastroesophageal reflux disease)    Hyperlipidemia    Hypertension    Lewy body dementia ([place])    Osteoporosis    Prostate enlargement    Past Surgical History:  Procedure Laterality Date   CHOLECYSTECTOMY N/A 09/10/2018   Procedure: LAPAROSCOPIC CHOLECYSTECTOMY;  Surgeon: Benjamine Sprague, DO;  Location: [facility] ORS;  Service: General;  Laterality: N/A;   GANGLION CYST EXCISION Left 2013   Left upper arm   HERNIA REPAIR N/A 2008   [place], ventral acquired hernia repair   HOLEP-LASER ENUCLEATION OF THE PROSTATE WITH MORCELLATION N/A 11/06/2018   Procedure: [place] MORCELLATION;  Surgeon: [name], MD;  Location: [facility] ORS;  Service: Urology;  Laterality: N/A;   Family History  Problem Relation Age of Onset   Breast cancer Mother    Lung cancer Father    Heart disease Brother    Leukemia Brother    Prostate cancer Neg Hx    Colon cancer Neg Hx    Bladder Cancer Neg Hx    Kidney cancer Neg Hx    Social History   Socioeconomic History   Marital status: Married    Spouse name: Not on file   Number of children: Not on file   Years of education: College   Highest education level: Not on file  Occupational History   Occupation: Retired Clinical biochemist  Tobacco Use   Smoking status: Former    Packs/day: 1.00    Years: 12.00    Pack years: 12.00    Types: Cigarettes    Quit date: 1968    Years since quitting: 54.5   Smokeless tobacco: Never  Vaping Use   Vaping Use: Never used  Substance and Sexual Activity   Alcohol use: No   Drug use: No   Sexual activity: Not Currently  Other Topics Concern   Not on file  Social History Narrative   Not on file   Social Determinants of Health   Financial Resource Strain: Low Risk    Difficulty of Paying Living Expenses: Not hard at all  Food Insecurity: No Food Insecurity    Worried About Charity fundraiser in the Last Year: Never true   Arboriculturist in the Last Year: Never true  Transportation Needs: No Transportation Needs   Lack of Transportation (Medical): No   Lack of Transportation (Non-Medical): No  Physical Activity: Inactive   Days of Exercise per Week: 0 days   Minutes of Exercise per Session: 0 min  Stress: No Stress Concern Present   Feeling of Stress : Not at all  Social Connections: Not on file    Tobacco Counseling Counseling given: Not Answered   Clinical Intake:  Pre-visit preparation completed: Yes  Pain : No/denies pain     Nutritional Status: BMI > 30  Obese Nutritional Risks: None Diabetes: No  How often  do you need to have someone help you when you read instructions, pamphlets, or other written materials from your doctor or pharmacy?: 1 - Never What is the last grade level you completed in school?: college  Diabetic? no  Interpreter Needed?: No  Information entered by :: NAllen LPN   Activities of Daily Living In your present state of health, do you have any difficulty performing the following activities: 07/25/2020  Hearing? Y  Vision? N  Difficulty concentrating or making decisions? Y  Walking or climbing stairs? Y  Dressing or bathing? N  Doing errands, shopping? Y  Preparing Food and eating ? Y  Using the Toilet? N  In the past six months, have you accidently leaked urine? N  Do you have problems with loss of bowel control? N  Managing your Medications? Y  Managing your Finances? Y  Housekeeping or managing your Housekeeping? Y  Some recent data might be hidden    Patient Care Team: [name], DO as PCP - General (Family Medicine) Kate Sable, MD as PCP - Cardiology (Cardiology) [place], Virl Diamond, [place] as Pharmacist Hall Busing [name], RN as Case Manager Rebekah Chesterfield, LCSW as Social Worker (Licensed Clinical Social Worker) Tamala Julian, Jonette Eva, NP as Nurse  Practitioner ([facility] [place]) Vladimir Crofts, MD as Consulting Physician (Neurology)  Indicate any recent [place] you may have received from other than [facility] providers in the past year (date may be approximate).     Assessment:   This is a routine wellness examination for [place].  Hearing/Vision screen Vision Screening - Comments:: No regular eye exams  Dietary issues and exercise activities discussed: Current Exercise Habits: The patient does not participate in regular exercise at present   Goals Addressed             This Visit's Progress    Patient Stated       07/25/2020, no goals       Depression Screen PHQ 2/9 Scores 07/25/2020 03/02/2019 01/22/2019 09/21/2018 09/16/2018 06/17/2018 04/21/2018  PHQ - 2 Score 0 0 0 0 0 0 0    Fall Risk Fall Risk  07/25/2020 03/02/2019 01/22/2019 09/16/2018 07/13/2018  Falls in the past year? 0 0 0 0 1  Number falls in past yr: - 0 0 - 1  Injury with Fall? - 0 0 - 1  Risk for fall due to : Medication side effect - - - History of fall(s);Impaired balance/gait  Risk for fall due to: Comment - - - - Shuffles feet  Follow up Falls evaluation completed;Education provided;Falls prevention discussed - Falls evaluation completed Falls evaluation completed Education provided;Falls prevention discussed    FALL RISK PREVENTION PERTAINING TO THE HOME:  Any stairs in or around the home? No  If so, are there any without handrails? N/a Home free of loose throw rugs in walkways, pet beds, electrical cords, etc? Yes  Adequate lighting in your home to reduce risk of falls? Yes   ASSISTIVE DEVICES UTILIZED TO PREVENT FALLS:  Life alert? No  Use of a cane, walker or w/c? Yes  Grab bars in the bathroom? Yes  Shower chair or bench in shower? Yes  Elevated toilet seat or a handicapped toilet? Yes   TIMED UP AND GO:  Was the test performed? No .      Cognitive Function:     6CIT Screen 02/24/2018 11/26/2016  What Year? 0 points 0  points  What month? 0 points 0 points  What time? 0  points 0 points  Count back from 20 0 points 0 points  Months in reverse 0 points 2 points  Repeat phrase 2 points 6 points  Total Score 2 8    Immunizations Immunization History  Administered Date(s) Administered   Fluad Quad(high Dose 65+) 11/07/2018, 11/10/2019   Influenza, High Dose Seasonal PF 10/01/2016, 10/07/2017   Pneumococcal Conjugate-13 09/11/2016   Pneumococcal Polysaccharide-23 10/31/2017   Tdap 03/05/2016    TDAP status: Up to date  Flu Vaccine status: Up to date  Pneumococcal vaccine status: Up to date  Covid-19 vaccine status: Declined, Education has been provided regarding the importance of this vaccine but patient still declined. Advised may receive this vaccine at local pharmacy or Health Dept.or vaccine clinic. Aware to provide a copy of the vaccination record if obtained from local pharmacy or Health Dept. Verbalized acceptance and understanding.  Qualifies for Shingles Vaccine? Yes   Zostavax completed No   Shingrix Completed?: No.    Education has been provided regarding the importance of this vaccine. Patient has been advised to call insurance company to determine out of pocket expense if they have not yet received this vaccine. Advised may also receive vaccine at local pharmacy or Health Dept. Verbalized acceptance and understanding.  Screening Tests Health Maintenance  Topic Date Due   COVID-19 Vaccine (1) Never done   Zoster Vaccines- Shingrix (1 of 2) Never done   INFLUENZA VACCINE  08/07/2020   TETANUS/TDAP  03/05/2026   PNA vac Low Risk Adult  Completed   HPV VACCINES  Aged Out    Health Maintenance  Health Maintenance Due  Topic Date Due   COVID-19 Vaccine (1) Never done   Zoster Vaccines- Shingrix (1 of 2) Never done    Colorectal cancer screening: No longer required.   Lung Cancer Screening: (Low Dose CT Chest recommended if Age 89-80 years, 30 pack-year currently smoking OR have  quit w/in 15years.) does not qualify.   Lung Cancer Screening Referral: no  Additional Screening:  Hepatitis C Screening: does not qualify;  Vision Screening: Recommended annual ophthalmology exams for early detection of glaucoma and other disorders of the eye. Is the patient up to date with their annual eye exam?  No  Who is the provider or what is the name of the office in which the patient attends annual eye exams? none If pt is not established with a provider, would they like to be referred to a provider to establish care? No .   Dental Screening: Recommended annual dental exams for proper oral hygiene  Community Resource Referral / Chronic Care Management: CRR required this visit?  No   CCM required this visit?  No      Plan:     I have personally reviewed and noted the following in the patient's chart:   Medical and social history Use of alcohol, tobacco or illicit drugs  Current medications and supplements including opioid prescriptions. Patient is not currently taking opioid prescriptions. Functional ability and status Nutritional status Physical activity Advanced directives List of other physicians Hospitalizations, surgeries, and ER visits in previous 12 months Vitals Screenings to include cognitive, depression, and falls Referrals and appointments  In addition, I have reviewed and discussed with patient certain preventive protocols, quality metrics, and best practice recommendations. A written personalized care plan for preventive services as well as general preventive health recommendations were provided to patient.     Kellie Simmering, LPN   0/17/5102   Nurse Notes: 6 CIT not administered.  Patient has a diagnosis of dementia. He is followed by neurology.

## 2020-07-27 ENCOUNTER — Telehealth: Payer: Self-pay

## 2020-07-27 NOTE — Telephone Encounter (Signed)
  Care Management   Follow Up Note   07/27/2020 Name: SILIS GAETZ MRN: [id number] DOB: Apr 24, 1934   Referred by: [name], DO Reason for referral : Chronic Care Management (RNCM follow up for Chronic Care Management and Care Coordination Needs.)   An unsuccessful telephone outreach was attempted today. The patient was referred to the case management team for assistance with care management and care coordination.   Follow Up Plan: A HIPPA compliant phone message was left for the patient providing contact information and requesting a return call.   Salvatore Marvel RN, [employer] Mobile: [phone]

## 2020-07-31 ENCOUNTER — Ambulatory Visit (INDEPENDENT_AMBULATORY_CARE_PROVIDER_SITE_OTHER): Payer: Medicare Other | Admitting: Pharmacist

## 2020-07-31 DIAGNOSIS — E782 Mixed hyperlipidemia: Secondary | ICD-10-CM | POA: Diagnosis not present

## 2020-07-31 DIAGNOSIS — I10 Essential (primary) hypertension: Secondary | ICD-10-CM | POA: Diagnosis not present

## 2020-07-31 NOTE — Patient Instructions (Signed)
Visit Information  PATIENT GOALS:  Goals Addressed             This Visit's Progress    Pharmacy Goals       Please continue to monitor home blood pressure and keep log of results and bring record to upcoming appointment with Cardiology   Feel free to call me with any questions or concerns. I look forward to our next call!   [name], PharmD, [place] [phone]        Caregiver verbalized understanding of instructions, educational materials, and care plan provided today and declined offer to receive copy of patient instructions, educational materials, and care plan.   Telephone follow up appointment with care management team member scheduled for: 8/31 at 8:30 am

## 2020-07-31 NOTE — Chronic Care Management (AMB) (Signed)
Chronic Care Management Pharmacy Note  07/31/2020 Name:  Randy [name] MRN:  [id number] DOB:  [birth year]   Subjective: Randy [name] is an 86 y.o. year old male who is a primary patient of [name], DO.  The CCM team was consulted for assistance with disease management and care coordination needs.    Engaged with patient's daughter by telephone for follow up visit in response to provider referral for pharmacy case management and/or care coordination services.   Consent to Services:  The patient was given information about Chronic Care Management services, agreed to services, and gave verbal consent prior to initiation of services.  Please see initial visit note for detailed documentation.   Patient Care Team: [name], DO as PCP - General (Family Medicine) Kate Sable, MD as PCP - Cardiology (Cardiology) [place], Virl Diamond, [place] as Pharmacist Hall Busing [name], RN as Case Manager Rebekah Chesterfield, LCSW as Social Worker (Licensed Clinical Social Worker) Tamala Julian, Jonette Eva, NP as Nurse Practitioner ([facility]) Vladimir Crofts, MD as Consulting Physician (Neurology)   Recent consult visits: Office Visit with [facility] on 7/5 [place] on 7/18  Hospital visits: None in previous 6 months  Objective:  Lab Results  Component Value Date   CREATININE 0.90 07/17/2020   CREATININE 0.98 05/09/2020   CREATININE 0.83 10/12/2019       Component Value Date/Time   CHOL 156 07/17/2020 0851   CHOL 151 04/17/2015 0826   TRIG 71 07/17/2020 0851   TRIG 116 04/17/2015 0826   HDL 37 (L) 07/17/2020 0851   HDL 32 (A) 04/17/2015 0826   CHOLHDL 4.2 07/17/2020 0851   VLDL 14 07/17/2020 0851   [place] 105 (H) 07/17/2020 0851   [place] 121 (H) 06/10/2018 0800     Social History   Tobacco Use  Smoking Status Former   Packs/day: 1.00   Years: 12.00   Pack years: 12.00   Types: Cigarettes    Quit date: 1968   Years since quitting: 54.6  Smokeless Tobacco Never   BP Readings from Last 3 Encounters:  07/11/20 130/70  06/29/20 136/81  05/29/20 138/80   Pulse Readings from Last 3 Encounters:  07/11/20 68  06/29/20 76  05/29/20 74   Wt Readings from Last 3 Encounters:  07/25/20 235 lb (106.6 kg)  07/11/20 220 lb (99.8 kg)  06/29/20 216 lb (98 kg)    Assessment: Review of patient past medical history, allergies, medications, health status, including review of consultants reports, laboratory and other test data, was performed as part of comprehensive evaluation and provision of chronic care management services.   SDOH:  (Social Determinants of Health) assessments and interventions performed: none   CCM Care Plan  Allergies  Allergen Reactions   Antihistamines, Chlorpheniramine-Type Other (See Comments)    Can't urinate    Medications Reviewed Today     Reviewed by Vella Raring, RPH-CPP (Pharmacist) on 07/31/20 at 1123  Med List Status: <None>   Medication Order Taking? Sig Documenting Provider Last Dose Status Informant  acetaminophen (TYLENOL) 500 MG tablet [id number] Yes Take 1,000 mg by mouth every 6 (six) hours as needed. [provider] Taking Active   aspirin 81 MG chewable tablet [id number] Yes Chew 81 mg by mouth daily. [provider] Taking Active   carbidopa-levodopa (SINEMET CR) 50-200 MG tablet [id number] Yes Take 1 tablet by mouth 4 (four) times daily. [provider] Taking Active   cholecalciferol (  VITAMIN D) 1000 units tablet [id number] Yes Take 1,000 Units by mouth daily. [provider] Taking Active Other  Coenzyme Q10 (CO Q 10) 100 MG CAPS [id number] Yes Take 100 mg by mouth daily.  [provider] Taking Active Other  diclofenac Sodium (VOLTAREN) 1 % GEL [id number] Yes Apply 2 g topically 4 (four) times daily as needed. To knee arthritis pain [name], DO Taking Active Pharmacy  Records  enalapril (VASOTEC) 10 MG tablet [id number] Yes Take 1 tablet (10 mg total) by mouth daily. [name], DO Taking Active   fluticasone (FLONASE) 50 MCG/ACT nasal spray [id number]  Place 2 sprays into both nostrils daily as needed for allergies.  [provider]  Active Other           Med Note [name], [name]   Mon Oct 26, 2018  1:07 PM)    furosemide (LASIX) 20 MG tablet [id number] Yes Take 1 tablet (20 mg total) by mouth every other day. Kate Sable, MD Taking Active   Multiple Vitamin (MULTIVITAMIN WITH MINERALS) TABS tablet [id number] Yes Take 1 tablet by mouth daily. [name], MD Taking Active   Omega-3 Fatty Acids (FISH OIL) 1200 MG CAPS [id number] Yes Take 1,200 mg by mouth 2 (two) times daily.  [provider] Taking Active Other  omeprazole (PRILOSEC) 20 MG capsule [id number] Yes Take 20 mg by mouth daily. [provider] Taking Active Other  rivastigmine (EXELON) 1.5 MG capsule [id number] Yes Take 1 capsule by mouth 2 (two) times daily with a meal. [provider] Taking Active   sertraline (ZOLOFT) 50 MG tablet [id number] Yes Take 50 mg by mouth at bedtime. [provider] Taking Active   traMADol (ULTRAM) 50 MG tablet [id number] No Take 1 tablet (50 mg total) by mouth every 8 (eight) hours as needed.  Patient not taking: No sig reported   [name], DO Not Taking Active   vitamin C (ASCORBIC ACID) 500 MG tablet [id number] Yes Take 500 mg by mouth daily. [provider] Taking Active Other  zinc sulfate 220 (50 Zn) MG capsule [id number] Yes Take 1 capsule (220 mg total) by mouth daily. [name], MD Taking Active             Patient Active Problem List   Diagnosis Date Noted   Bilateral primary osteoarthritis of knee 02/15/2020   Pressure injury of buttock, stage 1    Delirium    COVID-19 virus infection 10/08/2019   Unable to care for self 10/08/2019   Caregiver unable to  cope 10/08/2019   Recurrent falls 10/08/2019   Lewy body dementia without behavioral disturbance ([place])    Prostate cancer ([place]) 06/30/2019   BPH with urinary obstruction 11/06/2018   Acute cholecystitis 09/08/2018   Myalgia due to statin 02/24/2018   Tricompartment osteoarthritis of left knee 11/14/2017   Generalized weakness 04/28/2017   Chronic low back pain 03/12/2017   Knee pain, chronic 10/01/2016   Chronic venous insufficiency 08/30/2016   BPH without obstruction/lower urinary tract symptoms 03/06/2016   Osteoarthritis of multiple joints 03/06/2016   Degenerative joint disease (DJD) of lumbar spine 03/06/2016   Essential hypertension 03/05/2016   Hyperlipidemia 03/05/2016   Osteoporosis 03/05/2016   GERD (gastroesophageal reflux disease) 03/05/2016   Bilateral lower extremity edema 03/05/2016    Immunization History  Administered Date(s) Administered   Fluad Quad(high Dose 65+) 11/07/2018, 11/10/2019   Influenza, High Dose Seasonal PF 10/01/2016, 10/07/2017   Pneumococcal Conjugate-13 09/11/2016  Pneumococcal Polysaccharide-23 10/31/2017   Tdap 03/05/2016    Conditions to be addressed/monitored: HTN, HLD, dementia  Care Plan : PharmD - Medication Managment  Updates made by Vella Raring, RPH-CPP since 07/31/2020 12:00 AM     Problem: Disease Progression      Long-Range Goal: Disease Progression Prevented or Minimized   Start Date: 01/19/2020  Expected End Date: 04/18/2020  This Visit's Progress: On track  Recent Progress: On track  Priority: High  Note:   Current Barriers:  Chronic Disease Management support, education, and care coordination needs related to hypertension, osteoarthritis, hyperlipidemia, insomnia and memory loss Cognitive Deficits  Pharmacist Clinical Goal(s):  Over the next 90 days, caregiver/patient will adhere to plan to optimize therapeutic regimen for hypertension as evidenced by report of adherence to recommended medication  management changes through collaboration with PharmD and provider.   Interventions: 1:1 collaboration with [name], DO regarding development and update of comprehensive plan of care as evidenced by provider attestation and co-signature Inter-disciplinary care team collaboration (see longitudinal plan of care) Perform chart review  Patient seen for Office Visit with [facility] on 7/5. Provider recommended patient: Start aspirin 81 mg daily Plan to start Lipitor based on lipid panel Increase Lasix to 20 mg daily for lower extremity edema Check BMP in 1 week Per telephone note from 7/12, Cardiologist advised patient: Potassium is slightly reduced.  Reduce dose of Lasix to every other day Cholesterol levels slightly abnormal, monitor levels of cholesterol, low-cholesterol diet reasonable Palliative Care Home Visit on 7/18 Comprehensive medication review performed; medication list updated in electronic medical record Confirms patient started taking aspirin 81 mg daily as directed by Cardiologist Reports patient no longer taking melatonin as sleep improved  Hypertension Patient followed by [facility] Cardiology Reports patient taking: Enalapril 10 mg daily in the morning Furosemide 20 mg on Mondays, Wednesdays and Fridays Reports has been monitoring home BP and keeping log, but daughter does not have record with her today Encourage caregiver to continue to monitor home BP, keep log of results and to bring this record to upcoming appointment with Cardiology Reports swelling in patient's legs remains similar to when last seen by Cardiologist.  Daughter denies noticing a difference when patient took furosemide daily vs current every other day, but only taken daily for a couple of days Encourage caregiver to continue to have patient elevate legs and try new zipper compression stockings as recommeded by Palliative Care NP  Reports local medical supply store did not have  correct size for patient Encourage caregiver to request supply store order in patient's size or purchase online  Hyperlipidemia: Uncontrolled; current treatment: none Counsel on low-cholesterol diet, including having well-balanced meals that include vegetables and fruit and limiting saturated fats Educational handout provided Daughter reports having meals from [employer] has helped with patient having more balanced meals  Medication Adherence Note patient using weekly pillbox with slots for four times daily dosing Reports patient conitnues to need assistance from family for remembering four times/day dosing Have discussed with caregiver additional strategies for using medication alarm as adherence aid  Reports that she and her mom manage patient's as needed medications, including tramadol  COVID-19 Prevention: Reports has been unable to schedule patient for vaccination due to barriers (difficulty using online systems and need for Saturday appointment) As requested assist caregiver with scheduling appointments for COVID-19 vaccination through [facility]: 1st dose appointment: 7/30 at 10 am 2nd dose: 8/27 at 10 am  Patient Goals/Self-Care Activities Over the  next 90 days, caregiver/patient will:  - take medications as prescribed - check blood pressure, document, and provide at future appointments  Follow Up Plan: Telephone follow up appointment with care management team member scheduled for: 8/31 at 8:30 am       Patient's preferred pharmacy is:  [facility], [place] [place] [address] Phone: [phone] Fax: [phone]  [id number] [place] [address] [id number]: [phone]: [phone] Uses pill box? Yes   Follow Up:  Caregiver agrees to Care Plan and Follow-up.  EWallace Cullens PharmD, BPara March CPP Clinical Pharmacist [phone]

## 2020-08-03 ENCOUNTER — Telehealth: Payer: Self-pay

## 2020-08-03 NOTE — Chronic Care Management (AMB) (Signed)
  Care Management   Note  08/03/2020 Name: Randy [name] MRN: [id number] DOB: [birth year]  Randy [name] is a 86 y.o. year old male who is a primary care patient of [name], DO and is actively engaged with the care management team. I reached out to Ernesto Rutherford by phone today to assist with re-scheduling a follow up visit with the RN Case Manager  Follow up plan: Unsuccessful telephone outreach attempt made. A HIPAA compliant phone message was left for the patient providing contact information and requesting a return call.  The care management team will reach out to the patient again over the next 7 days.  If patient returns call to provider office, please advise to call [facility]  at [place], [place], [place], [place] [address] Direct Dial: [phone] [email or web address]: [email or web address]

## 2020-08-09 ENCOUNTER — Ambulatory Visit (INDEPENDENT_AMBULATORY_CARE_PROVIDER_SITE_OTHER): Payer: Medicare Other | Admitting: Family Medicine

## 2020-08-09 ENCOUNTER — Other Ambulatory Visit: Payer: Self-pay

## 2020-08-09 VITALS — BP 140/75 | HR 76 | Ht 72.0 in | Wt 217.4 lb

## 2020-08-09 DIAGNOSIS — N3001 Acute cystitis with hematuria: Secondary | ICD-10-CM

## 2020-08-09 DIAGNOSIS — R309 Painful micturition, unspecified: Secondary | ICD-10-CM | POA: Diagnosis not present

## 2020-08-09 LAB — POCT URINALYSIS DIPSTICK
Bilirubin, UA: NEGATIVE
Glucose, UA: NEGATIVE
Ketones, UA: NEGATIVE
Nitrite, UA: NEGATIVE
Protein, UA: POSITIVE — AB
Spec Grav, UA: 1.02 (ref 1.010–1.025)
Urobilinogen, UA: 0.2 E.U./dL
pH, UA: 5 (ref 5.0–8.0)

## 2020-08-09 MED ORDER — CIPROFLOXACIN HCL 500 MG PO TABS: 500.0000 mg | ORAL_TABLET | Freq: Two times a day (BID) | ORAL | 0 refills | Status: DC

## 2020-08-09 NOTE — Progress Notes (Signed)
Subjective:    Patient ID: Randy [name], male    DOB: [birth year], 86 y.o.   MRN: [id number]  Randy [name] is a 86 y.o. male presenting on 08/09/2020 for Dysuria  Patient presents for a same day appointment.  HPI  UTI Dysuria  History BPH, Prostate CA Prior history of UTI w gross hematuria Treated by me in 04/2020 with Cipro  followed by Dr Overton [facility], last visit 06/2020 for same issue. They were following PSA and bladder scan.  Episodic pelvic pain. Dysuria. No hematuria today. Onset within 1 week.  Denies fever chills nausea vomiting    Depression screen [facility] 2/9 07/25/2020 03/02/2019 01/22/2019  Decreased Interest 0 0 0  Down, Depressed, Hopeless 0 0 0  PHQ - 2 Score 0 0 0    Social History   Tobacco Use   Smoking status: Former    Packs/day: 1.00    Years: 12.00    Pack years: 12.00    Types: Cigarettes    Quit date: 1968    Years since quitting: 54.6   Smokeless tobacco: Never  Vaping Use   Vaping Use: Never used  Substance Use Topics   Alcohol use: No   Drug use: No    Review of Systems Per HPI unless specifically indicated above     Objective:    BP 140/75   Pulse 76   Ht 6' (1.829 m)   Wt 217 lb 6.4 oz (98.6 kg)   SpO2 95%   BMI 29.48 kg/m   Wt Readings from Last 3 Encounters:  08/09/20 217 lb 6.4 oz (98.6 kg)  07/25/20 235 lb (106.6 kg)  07/11/20 220 lb (99.8 kg)    Physical Exam Vitals and nursing note reviewed.  Constitutional:      General: He is not in acute distress.    Appearance: Normal appearance. He is well-developed. He is not diaphoretic.     Comments: Well-appearing, comfortable, cooperative  HENT:     Head: Normocephalic and atraumatic.  Eyes:     General:        Right eye: No discharge.        Left eye: No discharge.     Conjunctiva/sclera: Conjunctivae normal.  Cardiovascular:     Rate and Rhythm: Normal rate.  Pulmonary:     Effort: Pulmonary effort is normal.  Skin:    General: Skin is warm and dry.      Findings: No erythema or rash.  Neurological:     Mental Status: He is alert and oriented to person, place, and time.  Psychiatric:        Mood and Affect: Mood normal.        Behavior: Behavior normal.        Thought Content: Thought content normal.     Comments: Well groomed, good eye contact, normal speech and thoughts    Urine Culture Order: [id number] Status: Final result   Visible to patient: Yes (not seen)   Next appt: 08/16/2020 at 10:30 AM in Internal Medicine ([place])   Dx: Johney Maine hematuria   Specimen Information: Urine      1 Result Note   1 Patient Communication  Component 3 mo ago   MICRO NUMBER: [id number]   SPECIMEN QUALITY: Adequate   Sample Source URINE   STATUS: FINAL   ISOLATE 1: Enterococcus faecalis Abnormal    Comment: Greater than 100,000 CFU/mL of Enterococcus faecalis  Resulting Agency [facility]   Susceptibility   Enterococcus faecalis    URINE CULTURE POSITIVE 1    AMPICILLIN <=2  Sensitive    NITROFURANTOIN <=16  Sensitive 1    VANCOMYCIN 1  Sensitive           1 Legend:  S = Susceptible  I = Intermediate  R = Resistant  NS = Not susceptible  * = Not tested  NR = Not reported  **NN = See antimicrobic comments        Specimen Collected: 05/05/20 11:59        Results for orders placed or performed in visit on 08/09/20  POCT Urinalysis Dipstick  Result Value Ref Range   Color, UA Yellow    Clarity, UA Cloudy    Glucose, UA Negative Negative   Bilirubin, UA Negative    Ketones, UA Negative    Spec Grav, UA 1.020 1.010 - 1.025   Blood, UA Moderate ++    pH, UA 5.0 5.0 - 8.0   Protein, UA Positive (A) Negative   Urobilinogen, UA 0.2 0.2 or 1.0 E.U./dL   Nitrite, UA Negative    Leukocytes, UA Moderate (2+) (A) Negative   Appearance     Odor        Assessment & Plan:   Problem List Items Addressed This Visit   None Visit Diagnoses     Acute cystitis with hematuria    -  Primary    Relevant Medications   ciprofloxacin (CIPRO) 500 MG tablet   Painful urination       Relevant Orders   POCT Urinalysis Dipstick (Completed)       Presumed UTI with some constellation of symptoms Similar to prior urinary mixed symptoms Treated prior UTI 04/2020, Enterococcus, sensitive Resolved w/ Cipro Followed by Dr Overton Mam, last seen 06/2020, yearly surveillance  Trial on Cipro again 500 BID x 7 days Follow-up instruction if not improved can return to Dr Overton [facility]  Meds ordered this encounter  Medications   ciprofloxacin (CIPRO) 500 MG tablet    Sig: Take 1 tablet (500 mg total) by mouth 2 (two) times daily.    Dispense:  14 tablet    Refill:  0      Follow up plan: Return if symptoms worsen or fail to improve.   Randy [name], [facility] 08/09/2020, 1:40 PM

## 2020-08-09 NOTE — Patient Instructions (Addendum)
Thank you for coming to the office today.  STart antibiotic Cipro '500mg'$  twice a day for 7 days.  We will check urine culture.  Last time in 04/2020 he had urinary tract infection, and it was treated.  Urologist Dr Diamantina Providence is seeing you once a year for checkup, last visit 06/2020, however if this problem persists with urinary concerns, then I would recommend calling their office to schedule follow up on this issue if it is not improving with my treatment  [facility] -[address] [place],  [place]  [address] Phone: [phone]  Please schedule a Follow-up Appointment to: Return if symptoms worsen or fail to improve.  If you have any other questions or concerns, please feel free to call the office or send a message through [place]. You may also schedule an earlier appointment if necessary.  Additionally, you may be receiving a survey about your experience at our office within a few days to 1 week by e-mail or mail. We value your feedback.  [name], DO [place]

## 2020-08-16 ENCOUNTER — Ambulatory Visit (INDEPENDENT_AMBULATORY_CARE_PROVIDER_SITE_OTHER): Payer: Medicare Other | Admitting: Licensed Clinical Social Worker

## 2020-08-16 DIAGNOSIS — R413 Other amnesia: Secondary | ICD-10-CM

## 2020-08-16 DIAGNOSIS — G8929 Other chronic pain: Secondary | ICD-10-CM

## 2020-08-16 DIAGNOSIS — F0280 Dementia in other diseases classified elsewhere without behavioral disturbance: Secondary | ICD-10-CM

## 2020-08-16 DIAGNOSIS — M545 Low back pain, unspecified: Secondary | ICD-10-CM

## 2020-08-16 DIAGNOSIS — I10 Essential (primary) hypertension: Secondary | ICD-10-CM

## 2020-08-17 ENCOUNTER — Encounter: Payer: Self-pay | Admitting: Urology

## 2020-08-17 ENCOUNTER — Other Ambulatory Visit: Payer: Self-pay

## 2020-08-17 ENCOUNTER — Ambulatory Visit: Payer: Medicare Other | Admitting: Urology

## 2020-08-17 VITALS — BP 156/91 | HR 69 | Ht 72.0 in | Wt 217.0 lb

## 2020-08-17 DIAGNOSIS — N138 Other obstructive and reflux uropathy: Secondary | ICD-10-CM

## 2020-08-17 DIAGNOSIS — N390 Urinary tract infection, site not specified: Secondary | ICD-10-CM

## 2020-08-17 DIAGNOSIS — C61 Malignant neoplasm of prostate: Secondary | ICD-10-CM

## 2020-08-17 DIAGNOSIS — N401 Enlarged prostate with lower urinary tract symptoms: Secondary | ICD-10-CM

## 2020-08-17 LAB — URINALYSIS, COMPLETE
Bilirubin, UA: NEGATIVE
Glucose, UA: NEGATIVE
Ketones, UA: NEGATIVE
Nitrite, UA: NEGATIVE
Specific Gravity, UA: 1.025 (ref 1.005–1.030)
Urobilinogen, Ur: 0.2 mg/dL (ref 0.2–1.0)
pH, UA: 5.5 (ref 5.0–7.5)

## 2020-08-17 LAB — MICROSCOPIC EXAMINATION
Bacteria, UA: NONE SEEN
WBC, UA: >30 /hpf — AB (ref 0–5)

## 2020-08-17 LAB — BLADDER SCAN AMB NON-IMAGING

## 2020-08-17 MED ORDER — AMOXICILLIN 875 MG PO TABS
875.0000 mg | ORAL_TABLET | Freq: Two times a day (BID) | ORAL | 0 refills | Status: DC
Start: 2020-08-17 — End: 2020-09-06

## 2020-08-17 MED ORDER — NITROFURANTOIN MACROCRYSTAL 50 MG PO CAPS: 50.0000 mg | ORAL_CAPSULE | Freq: Every day | ORAL | 0 refills | Status: DC

## 2020-08-17 NOTE — Patient Instructions (Signed)
Visit Information   Goals Addressed               This Visit's Progress     Patient Stated     SW-"I'm having a hard time managing my health" (pt-stated)   On track     Patient Self Care Activities:  Attend all scheduled provider appointments Call provider office for new concerns or questions Continue utilizing healthy coping strategies to manage stressors      Other     SW-Manage My Health and Emotions   On track     Timeframe:  Long-Range Goal Priority:  Medium  Start Date:  04/11/20                         Expected End Date:  11/06/20                  Follow Up Date- 10/31/20  Patient Self Care Activities:  Attend all scheduled provider appointments Call provider office for new concerns or questions Continue utilizing healthy coping strategies to manage stressors        Patient verbalizes understanding of instructions provided today and agrees to view in [place].   Telephone follow up appointment with care management team member scheduled for:10/31/20  Christa See, MSW, [email or web address] Phone [phone] 1:29 PM

## 2020-08-17 NOTE — Patient Instructions (Signed)
Take cranberry tablets twice daily to help prevent urinary infections  Try to empty her bladder every 2-3 hours to prevent overfilling and recurrent infections

## 2020-08-17 NOTE — Progress Notes (Signed)
   08/17/2020 10:27 AM   Randy [name] 04-18-1934 [id number]  Reason for visit: Follow up BPH, history of retention, recurrent UTIs  HPI: 86 year old male seen again with his daughter today.  She provides most of the history.  He is an 86 year old comorbid male with history of Lewy body dementia who originally presented to me in the fall 2020 with Foley dependent urinary retention.  He underwent a HOLEP with removal of 33 g of tissue showing <5% involvement with prostate adenocarcinoma Gleason score 4+3=7.  PSA preop was 2.7, and postop was 0.5, then 0.6 most recently.  He overall has done well, and PVRs had a voice been normal when I have seen him in follow-up less than 100 mL.  He denies any significant urinary complaints with voiding when he does not have an acute infection, and urinates with a strong stream, feels he empties completely, and denies any urgency or incontinence.  He had an Enterococcus UTI in April 2022 treated with PCP which resolved with antibiotics.  He had another episode of UTI symptoms with dysuria and dipstick urinalysis with PCP on 08/09/2020 was concerning for infection with 2+ leukocytes, but microscopic was not sent, neither was a culture.  He was treated with Cipro with no improvement in his urinary symptoms.  Urinalysis today suspicious for infection with greater than 30 WBCs, 3-10 RBCs, no bacteria, nitrite negative, 1+ leukocytes.  Will send for culture and atypicals.  PVR today is elevated at 300 mL.  We discussed possible etiologies including recurrent UTIs, prostatitis, bladder neck contracture/urethral stricture, urolithiasis, or other new bladder pathology.  We discussed options including trial of antibiotics and cranberry tablets as well as low-dose prophylaxis for a few months, cystoscopy for further evaluation of his symptoms, or catheterization.  As he is not having any bothersome urinary symptoms and in the setting of his comorbidities he would like to try a  different course of antibiotics and follow-up culture results, with a plan for low-dose prophylaxis for a few months.  He understands the need for cystoscopy for further evaluation if he continues to have recurrent infections of unclear etiology.  Recommended starting cranberry tablets for UTI prevention Amoxicillin twice daily x10 days for suspected acute UTI, follow-up culture results Nitrofurantoin daily prophylaxis x3 months Return precautions discussed extensively RTC 3 months PVR and symptom check, sooner if problems  [name], [place] [address], [place], [place] [address] [phone]

## 2020-08-17 NOTE — Chronic Care Management (AMB) (Signed)
Chronic Care Management    Clinical Social Work Note  08/17/2020 Name: Randy [name] MRN: [id number] DOB: [birth year]  [name] is a 86 y.o. year old male who is a primary care patient of Randy Hauser, DO. The CCM team was consulted to assist the patient with chronic disease management and/or care coordination needs related to: Level of Care Concerns and Caregiver Stress.   Engaged with patient's daughter by telephone for follow up visit in response to provider referral for social work chronic care management and care coordination services.   Consent to Services:  The patient was given information about Chronic Care Management services, agreed to services, and gave verbal consent prior to initiation of services.  Please see initial visit note for detailed documentation.   Patient agreed to services and consent obtained.   Consent to Services:  The patient was given information about Care Management services, agreed to services, and gave verbal consent prior to initiation of services.  Please see initial visit note for detailed documentation.   Patient agreed to services today and consent obtained.   Assessment: Engaged with patient's daughter by telephone in response to provider referral for social work care coordination services: Level of Care Concerns, Mental Health Counseling and Resources, and Caregiver Stress.   Patient's daughter provided all information during this encounter. Patient continues to maintain positive progress with care plan goals. He continues to experience pain when urinating despite taking prescribed medication. Patient has an appointment with Urologist for 08/17/20 to further assess.   See Care Plan below for interventions and patient self-care actives.  Recent life changes or stressors: Management of health conditions  Recommendation: Patient may benefit from, and is in agreement work with LCSW to address care coordination needs and will continue to  work with the clinical team to address health care and disease management related needs.   Follow up Plan: Patient would like continued follow-up from CCM LCSW .  per patient's request will follow up in 10/31/20. Patient will call office if needed prior to next encounter.   SDOH (Social Determinants of Health) assessments and interventions performed:    Advanced Directives Status: Not addressed in this encounter.  CCM Care Plan  Allergies  Allergen Reactions   Antihistamines, Chlorpheniramine-Type Other (See Comments)    Can't urinate    Outpatient Encounter Medications as of 08/16/2020  Medication Sig   acetaminophen (TYLENOL) 500 MG tablet Take 1,000 mg by mouth every 6 (six) hours as needed.   aspirin 81 MG chewable tablet Chew 81 mg by mouth daily.   carbidopa-levodopa (SINEMET CR) 50-200 MG tablet Take 1 tablet by mouth 4 (four) times daily.   cholecalciferol (VITAMIN D) 1000 units tablet Take 1,000 Units by mouth daily.   ciprofloxacin (CIPRO) 500 MG tablet Take 1 tablet (500 mg total) by mouth 2 (two) times daily.   Coenzyme Q10 (CO Q 10) 100 MG CAPS Take 100 mg by mouth daily.    diclofenac Sodium (VOLTAREN) 1 % GEL Apply 2 g topically 4 (four) times daily as needed. To knee arthritis pain   enalapril (VASOTEC) 10 MG tablet Take 1 tablet (10 mg total) by mouth daily.   fluticasone (FLONASE) 50 MCG/ACT nasal spray Place 2 sprays into both nostrils daily as needed for allergies.    furosemide (LASIX) 20 MG tablet Take 1 tablet (20 mg total) by mouth every other day.   Multiple Vitamin (MULTIVITAMIN WITH MINERALS) TABS tablet Take 1 tablet by mouth daily.   Omega-3  Fatty Acids (FISH OIL) 1200 MG CAPS Take 1,200 mg by mouth 2 (two) times daily.    omeprazole (PRILOSEC) 20 MG capsule Take 20 mg by mouth daily.   rivastigmine (EXELON) 1.5 MG capsule Take 1 capsule by mouth 2 (two) times daily with a meal.   sertraline (ZOLOFT) 50 MG tablet Take 50 mg by mouth at bedtime.   traMADol  (ULTRAM) 50 MG tablet Take 1 tablet (50 mg total) by mouth every 8 (eight) hours as needed.   vitamin C (ASCORBIC ACID) 500 MG tablet Take 500 mg by mouth daily.   zinc sulfate 220 (50 Zn) MG capsule Take 1 capsule (220 mg total) by mouth daily.   No facility-administered encounter medications on file as of 08/16/2020.    Patient Active Problem List   Diagnosis Date Noted   Bilateral primary osteoarthritis of knee 02/15/2020   Pressure injury of buttock, stage 1    Delirium    COVID-19 virus infection 10/08/2019   Unable to care for self 10/08/2019   Caregiver unable to cope 10/08/2019   Recurrent falls 10/08/2019   Lewy body dementia without behavioral disturbance ([place])    Prostate cancer ([place]) 06/30/2019   BPH with urinary obstruction 11/06/2018   Acute cholecystitis 09/08/2018   Myalgia due to statin 02/24/2018   Tricompartment osteoarthritis of left knee 11/14/2017   Generalized weakness 04/28/2017   Chronic low back pain 03/12/2017   Knee pain, chronic 10/01/2016   Chronic venous insufficiency 08/30/2016   BPH without obstruction/lower urinary tract symptoms 03/06/2016   Osteoarthritis of multiple joints 03/06/2016   Degenerative joint disease (DJD) of lumbar spine 03/06/2016   Essential hypertension 03/05/2016   Hyperlipidemia 03/05/2016   Osteoporosis 03/05/2016   GERD (gastroesophageal reflux disease) 03/05/2016   Bilateral lower extremity edema 03/05/2016    Conditions to be addressed/monitored: Dementia; Level of care concerns and Memory Deficits  Care Plan : General Social Work (Adult)  Updates made by Rebekah Chesterfield, LCSW since 08/17/2020 12:00 AM     Problem: Caregiver Stress      Long-Range Goal: Caregiver Coping Optimized for Daughter   Start Date: 04/11/2020  This Visit's Progress: On track  Recent Progress: On track  Priority: Medium  Note:   Timeframe:  Long-Range Goal Priority:  Medium  Start Date:  04/11/20                         Expected End  Date:  11/06/20                  Follow Up Date- 10/31/20  Current Barriers:  Financial constraints Level of care concerns ADL IADL limitations Social Isolation Limited access to caregiver Memory Deficits Lacks knowledge of community resource: personal care service resources within the area that may benefit patient Lack of socialization   Clinical Social Work Clinical Goal(s):  Over the next 90 days, client will work with SW to address concerns related to lack of care/support within the home Interventions: Patient's daughter provided all information during visit Patient completed ciprofloxacin; however, his symptoms have not resolved  Patient has a scheduled appointment with Urologist on 08/17/20 to further assess  Patient continues to take Tylenol to assist with temporary pain management Family has not observed an increase in depression or anxiety symptoms  Palliative Care has visited the home and completed referral for meal delivery. Family is appreciative for the support Patient continues to utilize walker to promote safety and decrease fall  risk CCM LCSW acknowledged caregiver stress. Strategies to enhance self-care were identified  CCM LCSW informed patient's daughter that [facility] has opened back up. Family has their contact information and agreed to contact them when ready 08/10: He is not interested in participating in day centers, at this time Discussed plans with patient for ongoing care management follow up and provided patient with direct contact information for care management team Advised patient to contact CCM team with any urgent concerns 1:1 collaboration with primary care provider regarding development and update of comprehensive plan of care as evidenced by provider attestation and co-signature Patient Self Care Activities:  Attend all scheduled provider appointments Call provider office for new concerns or questions Continue utilizing healthy coping  strategies to manage stressors      Christa See, MSW, [email or web address] Phone [phone] 1:21 PM

## 2020-08-21 LAB — CULTURE, URINE COMPREHENSIVE

## 2020-08-23 LAB — MYCOPLASMA / UREAPLASMA CULTURE
Mycoplasma hominis Culture: NEGATIVE
Ureaplasma urealyticum: NEGATIVE

## 2020-08-25 ENCOUNTER — Other Ambulatory Visit: Payer: Self-pay

## 2020-08-25 ENCOUNTER — Ambulatory Visit: Payer: Medicare Other | Admitting: Cardiology

## 2020-08-25 ENCOUNTER — Encounter: Payer: Self-pay | Admitting: Cardiology

## 2020-08-25 VITALS — BP 130/70 | HR 67 | Ht 72.0 in | Wt 218.0 lb

## 2020-08-25 DIAGNOSIS — E7800 Pure hypercholesterolemia, unspecified: Secondary | ICD-10-CM

## 2020-08-25 DIAGNOSIS — I872 Venous insufficiency (chronic) (peripheral): Secondary | ICD-10-CM | POA: Diagnosis not present

## 2020-08-25 DIAGNOSIS — I10 Essential (primary) hypertension: Secondary | ICD-10-CM

## 2020-08-25 NOTE — Patient Instructions (Signed)

## 2020-08-25 NOTE — Progress Notes (Signed)
Cardiology Office Note:    Date:  08/25/2020   ID:  [name], [name] 08-12-1934, MRN [id number]  PCP:  [name], DO   [facility] Prov[facility] Cardiologist:  Kate Sable, MD     Referring MD: [name] *   Chief Complaint  Patient presents with   Other    4-6 week follow up. Meds reviewed verbally with patient.     History of Present Illness:    Randy [name] is a 86 y.o. male with a hx of hypertension, hyperlipidemia, Lewy body dementia presenting for follow-up.    Patient being seen for lower extremity edema and hyperlipidemia.  Her recent lipid panel obtained.  States Lasix not helping with lower extremity edema.  Potassium was noted to be low, Lasix dose was decreased to every other day.  Otherwise has no new concerns at this time.  Prior notes Echo [id number], normal systolic function, EF 55 to 60%, impaired relaxation. Lexiscan Myoview with no evidence for ischemia.  Coronary artery calcification noted   Past Medical History:  Diagnosis Date   Arthritis    GERD (gastroesophageal reflux disease)    Hyperlipidemia    Hypertension    Lewy body dementia ([place])    Osteoporosis    Prostate enlargement     Past Surgical History:  Procedure Laterality Date   CHOLECYSTECTOMY N/A 09/10/2018   Procedure: LAPAROSCOPIC CHOLECYSTECTOMY;  Surgeon: Benjamine Sprague, DO;  Location: [facility] ORS;  Service: General;  Laterality: N/A;   GANGLION CYST EXCISION Left 2013   Left upper arm   HERNIA REPAIR N/A 2008   [place], ventral acquired hernia repair   HOLEP-LASER ENUCLEATION OF THE PROSTATE WITH MORCELLATION N/A 11/06/2018   Procedure: [place] MORCELLATION;  Surgeon: [name], MD;  Location: [facility] ORS;  Service: Urology;  Laterality: N/A;    Current Medications: Current Meds  Medication Sig   acetaminophen (TYLENOL) 500 MG tablet Take 1,000 mg by mouth every 6 (six) hours as needed.   amoxicillin (AMOXIL)  875 MG tablet Take 1 tablet (875 mg total) by mouth every 12 (twelve) hours.   aspirin 81 MG chewable tablet Chew 81 mg by mouth daily.   carbidopa-levodopa (SINEMET CR) 50-200 MG tablet Take 1 tablet by mouth 4 (four) times daily.   cholecalciferol (VITAMIN D) 1000 units tablet Take 1,000 Units by mouth daily.   Coenzyme Q10 (CO Q 10) 100 MG CAPS Take 100 mg by mouth daily.    diclofenac Sodium (VOLTAREN) 1 % GEL Apply 2 g topically 4 (four) times daily as needed. To knee arthritis pain   enalapril (VASOTEC) 10 MG tablet Take 1 tablet (10 mg total) by mouth daily.   fluticasone (FLONASE) 50 MCG/ACT nasal spray Place 2 sprays into both nostrils daily as needed for allergies.    furosemide (LASIX) 20 MG tablet Take 1 tablet (20 mg total) by mouth every other day.   Multiple Vitamin (MULTIVITAMIN WITH MINERALS) TABS tablet Take 1 tablet by mouth daily.   nitrofurantoin (MACRODANTIN) 50 MG capsule Take 1 capsule (50 mg total) by mouth daily.   Omega-3 Fatty Acids (FISH OIL) 1200 MG CAPS Take 1,200 mg by mouth 2 (two) times daily.    omeprazole (PRILOSEC) 20 MG capsule Take 20 mg by mouth daily.   rivastigmine (EXELON) 1.5 MG capsule Take 1 capsule by mouth 2 (two) times daily with a meal.   sertraline (ZOLOFT) 50 MG tablet Take 50 mg by mouth at bedtime.  traMADol (ULTRAM) 50 MG tablet Take 1 tablet (50 mg total) by mouth every 8 (eight) hours as needed.   vitamin C (ASCORBIC ACID) 500 MG tablet Take 500 mg by mouth daily.   zinc sulfate 220 (50 Zn) MG capsule Take 1 capsule (220 mg total) by mouth daily.     Allergies:   Antihistamines, chlorpheniramine-type   Social History   Socioeconomic History   Marital status: Married    Spouse name: Not on file   Number of children: Not on file   Years of education: College   Highest education level: Not on file  Occupational History   Occupation: Retired Clinical biochemist  Tobacco Use   Smoking status: Former    Packs/day: 1.00    Years: 12.00     Pack years: 12.00    Types: Cigarettes    Quit date: 1968    Years since quitting: 2.6   Smokeless tobacco: Never  Vaping Use   Vaping Use: Never used  Substance and Sexual Activity   Alcohol use: No   Drug use: No   Sexual activity: Not Currently  Other Topics Concern   Not on file  Social History Narrative   Not on file   Social Determinants of Health   Financial Resource Strain: Low Risk    Difficulty of Paying Living Expenses: Not hard at all  Food Insecurity: No Food Insecurity   Worried About Charity fundraiser in the Last Year: Never true   Arboriculturist in the Last Year: Never true  Transportation Needs: No Transportation Needs   Lack of Transportation (Medical): No   Lack of Transportation (Non-Medical): No  Physical Activity: Inactive   Days of Exercise per Week: 0 days   Minutes of Exercise per Session: 0 min  Stress: No Stress Concern Present   Feeling of Stress : Not at all  Social Connections: Not on file     Family History: The patient's family history includes Breast cancer in his mother; Heart disease in his brother; Leukemia in his brother; Lung cancer in his father. There is no history of Prostate cancer, Colon cancer, Bladder Cancer, or Kidney cancer.  ROS:   Please see the history of present illness.     All other systems reviewed and are negative.  EKGs/Labs/Other Studies Reviewed:    The following studies were reviewed today:   EKG:  EKG is ordered today.  EKG shows normal sinus rhythm, LVH.  Recent Labs: 05/09/2020: ALT 5; B Natriuretic Peptide 211.3; Hemoglobin 12.1; Platelets 192 07/17/2020: BUN 25; Creatinine, Ser 0.90; Potassium 3.4; Sodium 138  Recent Lipid Panel    Component Value Date/Time   CHOL 156 07/17/2020 0851   CHOL 151 04/17/2015 0826   TRIG 71 07/17/2020 0851   TRIG 116 04/17/2015 0826   HDL 37 (L) 07/17/2020 0851   HDL 32 (A) 04/17/2015 0826   CHOLHDL 4.2 07/17/2020 0851   VLDL 14 07/17/2020 0851   LDLCALC 105 (H)  07/17/2020 0851   LDLCALC 121 (H) 06/10/2018 0800     Risk Assessment/Calculations:      Physical Exam:    VS:  BP 130/70 (BP Location: Right Arm, Patient Position: Sitting, Cuff Size: Normal)   Pulse 67   Ht 6' (1.829 m)   Wt 218 lb (98.9 kg)   SpO2 96%   BMI 29.57 kg/m     Wt Readings from Last 3 Encounters:  08/25/20 218 lb (98.9 kg)  08/17/20 217 lb (98.4 kg)  08/09/20 217 lb 6.4 oz (98.6 kg)     GEN:  Well nourished, well developed in no acute distress HEENT: Normal NECK: No JVD; No carotid bruits LYMPHATICS: No lymphadenopathy CARDIAC: RRR, no murmurs, rubs, gallops RESPIRATORY:  Clear anteriorly, diminished breath sounds at bases ABDOMEN: Soft, non-tender, non-distended MUSCULOSKELETAL:  1+ nonpitting edema; varicose veins, brown skin discoloration  SKIN: Warm and dry NEUROLOGIC:  Alert and oriented x 3 PSYCHIATRIC:  Normal affect   ASSESSMENT:    1. Primary hypertension   2. Pure hypercholesterolemia   3. Chronic venous insufficiency     PLAN:    In order of problems listed above:  Hypertension, BP controlled.  Continue enalapril. Hyperlipidemia, LDL 105.  Due to patient's age, low-cholesterol diet reasonable.  Aspirin 81 mg okay as patient has coronary calcifications on CT. Lower extremity edema, skin discoloration, varicose veins, consistent with venous insufficiency, CEAP class C4A.  Leg raising when in seated position, Ace wraps/compression stockings advised.  Will refer patient to vein and vascular clinic to see if pneumatic devices or other therapies will help.  Follow-up in 6 months   Medication Adjustments/Labs and Tests Ordered: Current medicines are reviewed at length with the patient today.  Concerns regarding medicines are outlined above.  Orders Placed This Encounter  Procedures   Ambulatory referral to Vascular Surgery   EKG 12-Lead     No orders of the defined types were placed in this encounter.    Patient Instructions   Medication Instructions:   Your physician recommends that you continue on your current medications as directed. Please refer to the Current Medication list given to you today.  *If you need a refill on your cardiac medications before your next appointment, please call your pharmacy*   Lab Work: None ordered If you have labs (blood work) drawn today and your tests are completely normal, you will receive your results only by: [place] (if you have MyChart) OR A paper copy in the mail If you have any lab test that is abnormal or we need to change your treatment, we will call you to review the results.   Testing/Procedures: None ordered   Follow-Up: At [facility], you and your health needs are our priority.  As part of our continuing mission to provide you with exceptional heart care, we have created designated Provider Care Teams.  These Care Teams include your primary Cardiologist (physician) and Advanced Practice Providers (APPs -  Physician Assistants and Nurse Practitioners) who all work together to provide you with the care you need, when you need it.  We recommend signing up for the patient portal called "MyChart".  Sign up information is provided on this After Visit Summary.  MyChart is used to connect with patients for Virtual Visits (Telemedicine).  Patients are able to view lab/test results, encounter notes, upcoming appointments, etc.  Non-urgent messages can be sent to your provider as well.   To learn more about what you can do with MyChart, go to [email or web address].    Your next appointment:   6 month(s)  The format for your next appointment:   In Person  Provider:   Kate Sable, MD   Other Instructions     Signed, Kate Sable, MD  08/25/2020 12:38 PM    [place]

## 2020-08-29 NOTE — Chronic Care Management (AMB) (Signed)
  Care Management   Note  08/29/2020 Name: Randy [name] MRN: [id number] DOB: [birth year]  Randy [name] is a 86 y.o. year old male who is a primary care patient of [name], DO and is actively engaged with the care management team. I reached out to Ernesto Rutherford by phone today to assist with re-scheduling a follow up visit with the RN Case Manager  Follow up plan: Telephone appointment with care management team member scheduled for:09/04/2020  Noreene Larsson, [place], [place], [place] [address] Direct Dial: [phone] [email or web address]

## 2020-09-04 ENCOUNTER — Ambulatory Visit: Payer: Self-pay

## 2020-09-04 ENCOUNTER — Other Ambulatory Visit: Payer: Medicare Other | Admitting: Primary Care

## 2020-09-04 ENCOUNTER — Other Ambulatory Visit: Payer: Self-pay

## 2020-09-04 ENCOUNTER — Telehealth: Payer: Medicare Other | Admitting: General Practice

## 2020-09-04 DIAGNOSIS — N3001 Acute cystitis with hematuria: Secondary | ICD-10-CM

## 2020-09-04 DIAGNOSIS — R413 Other amnesia: Secondary | ICD-10-CM

## 2020-09-04 DIAGNOSIS — F0280 Dementia in other diseases classified elsewhere without behavioral disturbance: Secondary | ICD-10-CM

## 2020-09-04 DIAGNOSIS — Z515 Encounter for palliative care: Secondary | ICD-10-CM

## 2020-09-04 DIAGNOSIS — R309 Painful micturition, unspecified: Secondary | ICD-10-CM

## 2020-09-04 DIAGNOSIS — N400 Enlarged prostate without lower urinary tract symptoms: Secondary | ICD-10-CM

## 2020-09-04 DIAGNOSIS — E782 Mixed hyperlipidemia: Secondary | ICD-10-CM

## 2020-09-04 DIAGNOSIS — R600 Localized edema: Secondary | ICD-10-CM

## 2020-09-04 DIAGNOSIS — I10 Essential (primary) hypertension: Secondary | ICD-10-CM

## 2020-09-04 NOTE — Progress Notes (Signed)
[facility] Palliative Care Consult Note Telephone: [phone]  Fax: [phone]    Date of encounter: 09/04/20 4:29 PM PATIENT NAME: Randy [name] [address] [place] [address]   [phone] (home)  DOB: 1934/08/28 MRN: [id number] PRIMARY CARE PROVIDER:    [name], DO,  [place] [address] [phone]  REFERRING PROVIDER:   [name], DO [address] [place],  [place] [address] [phone]  RESPONSIBLE PARTY:    Contact Information     Name Relation Home Work Mobile   [name],Randy Daughter   [phone]   Randy [name], College [phone]          I met face to face with patient and family in  home. Palliative Care was asked to follow this patient by consultation request of  Randy [name] * to address advance care planning and complex medical decision making. This is a follow up visit.                                   ASSESSMENT AND PLAN / RECOMMENDATIONS:   Advance Care Planning/Goals of Care: Goals include to maximize quality of life and symptom management. Our advance care planning conversation included a discussion about:    The value and importance of advance care planning  Exploration of personal, cultural or spiritual beliefs that might influence medical decisions  Identification of a healthcare agent - daughter Randy [name] Review of an  advance directive document = family has not completed and daughter was not available at visit today CODE STATUS:FULL  Symptom Management/Plan:  Frequent UTI: Pt complaining of redness on penis and a one time burning with urination. NP examined penis and groin area; no redness, discharge or rash seen. Pt educated that should the burning not improve by Wednesday or have fevers then  make an appt to be seen and give  urine sample to his primary care.  Np discussed importance of completing all antibiotics, as well as taking cranberry pills. Pt educated  on drinking adequate fluids esp water; pt attests to not drinking a lot of water.   Blood Pressure: Pt attests to blood pressure being a little high. NP took blood pressure reading of 145/98.  Blood pressure medications discussed. Pt educated on limiting sodium intake. Pt also educated and reinforced on elevating feet when sitting or lying per edema in feet. LE edema still 3+.  Respiratory: Pt denies being SOB at this time. No effort of breathing.  GI/Diet: Sodium intake discussed. Pt denies constipation at this time. Pt educated on taking yogurt or probiotic to help with abdominal discomfort that antibiotics can cause. He does not like yogurt. I let his wife know to obtain pro biotic pills.  Follow up Palliative Care Visit: Palliative care will continue to follow for complex medical decision making, advance care planning, and clarification of goals. Return 8 weeks or prn.  I spent 60 minutes providing this consultation. More than 50% of the time in this consultation was spent in counseling and care coordination.  PPS: 40%  HOSPICE ELIGIBILITY/DIAGNOSIS: no  Chief Complaint: urine burning, LE Edema  HISTORY OF PRESENT ILLNESS:  Randy [name] is a 86 y.o. year old male  with Lewy body dementia, LE Edema, recent UTI and some burning reported subsequently.   History obtained from review of EMR, discussion with primary team, and interview with family, facility staff/caregiver and/or Mr.  [name].  I reviewed available labs, medications, imaging, studies and related documents from the EMR.  Records reviewed and summarized above.   ROS  General: NAD ENMT: denies dysphagia Cardiovascular: denies chest pain, denies DOE Pulmonary: denies cough, denies increased SOB Abdomen: endorses good appetite, denies constipation, endorses continence of bowel GU: endorses x 1 dysuria, endorses continence of urine MSK:  endorses mild  weakness,  no falls reported Skin: denies rashes or wounds Neurological:  denies pain, denies insomnia Psych: Endorses positive mood Heme/lymph/immuno: denies bruises, abnormal bleeding  Physical Exam: Current and past weights:unavailable Constitutional: NAD General: frail appearing, WNWD EYES: anicteric sclera, lids intact, no discharge  ENMT: intact hearing, oral mucous membranes moist, dentition intact CV: 3+ bil LE edema Pulmonary:  no increased work of breathing, no cough, room air Abdomen: intake 75%, no ascites GU: penis glans non red, no d/c, no groin rash MSK: mild  sarcopenia, moves all extremities, ambulatory with walker  Skin: warm and dry, no rashes or wounds on visible skin Neuro:  mild  generalized weakness,  ++cognitive impairment Psych: non-anxious affect, A and O x 1-2 Hem/lymph/immuno: no widespread bruising  Thank you for the opportunity to participate in the care of Randy [name].  The palliative care team will continue to follow. Please call our office at [phone] if we can be of additional assistance.   Randy Coop, NP   COVID-19 PATIENT SCREENING TOOL Asked and negative response unless otherwise noted:   Have you had symptoms of covid, tested positive or been in contact with someone with symptoms/positive test in the past 5-10 days?

## 2020-09-04 NOTE — Chronic Care Management (AMB) (Signed)
Chronic Care Management   CCM RN Visit Note  09/04/2020 Name: Randy [name] MRN: [id number] DOB: 1934-05-27  Subjective: Randy [name] is a 86 y.o. year old male who is a primary care patient of Randy Hauser, DO. The care management team was consulted for assistance with disease management and care coordination needs.    Engaged with patient by telephone for follow up visit in response to provider referral for case management and/or care coordination services.   Consent to Services:  The patient was given information about Chronic Care Management services, agreed to services, and gave verbal consent prior to initiation of services.  Please see initial visit note for detailed documentation.   Patient agreed to services and verbal consent obtained.   Assessment: Review of patient past medical history, allergies, medications, health status, including review of consultants reports, laboratory and other test data, was performed as part of comprehensive evaluation and provision of chronic care management services.   SDOH (Social Determinants of Health) assessments and interventions performed:    CCM Care Plan  Allergies  Allergen Reactions   Antihistamines, Chlorpheniramine-Type Other (See Comments)    Can't urinate    Outpatient Encounter Medications as of 09/04/2020  Medication Sig   acetaminophen (TYLENOL) 500 MG tablet Take 1,000 mg by mouth every 6 (six) hours as needed.   amoxicillin (AMOXIL) 875 MG tablet Take 1 tablet (875 mg total) by mouth every 12 (twelve) hours.   aspirin 81 MG chewable tablet Chew 81 mg by mouth daily.   carbidopa-levodopa (SINEMET CR) 50-200 MG tablet Take 1 tablet by mouth 4 (four) times daily.   cholecalciferol (VITAMIN D) 1000 units tablet Take 1,000 Units by mouth daily.   Coenzyme Q10 (CO Q 10) 100 MG CAPS Take 100 mg by mouth daily.    diclofenac Sodium (VOLTAREN) 1 % GEL Apply 2 g topically 4 (four) times daily as needed. To knee arthritis  pain   enalapril (VASOTEC) 10 MG tablet Take 1 tablet (10 mg total) by mouth daily.   fluticasone (FLONASE) 50 MCG/ACT nasal spray Place 2 sprays into both nostrils daily as needed for allergies.    furosemide (LASIX) 20 MG tablet Take 1 tablet (20 mg total) by mouth every other day.   Multiple Vitamin (MULTIVITAMIN WITH MINERALS) TABS tablet Take 1 tablet by mouth daily.   nitrofurantoin (MACRODANTIN) 50 MG capsule Take 1 capsule (50 mg total) by mouth daily.   Omega-3 Fatty Acids (FISH OIL) 1200 MG CAPS Take 1,200 mg by mouth 2 (two) times daily.    omeprazole (PRILOSEC) 20 MG capsule Take 20 mg by mouth daily.   rivastigmine (EXELON) 1.5 MG capsule Take 1 capsule by mouth 2 (two) times daily with a meal.   sertraline (ZOLOFT) 50 MG tablet Take 50 mg by mouth at bedtime.   traMADol (ULTRAM) 50 MG tablet Take 1 tablet (50 mg total) by mouth every 8 (eight) hours as needed.   vitamin C (ASCORBIC ACID) 500 MG tablet Take 500 mg by mouth daily.   zinc sulfate 220 (50 Zn) MG capsule Take 1 capsule (220 mg total) by mouth daily.   No facility-administered encounter medications on file as of 09/04/2020.    Patient Active Problem List   Diagnosis Date Noted   Bilateral primary osteoarthritis of knee 02/15/2020   Pressure injury of buttock, stage 1    Delirium    COVID-19 virus infection 10/08/2019   Unable to care for self 10/08/2019   Caregiver unable to  cope 10/08/2019   Recurrent falls 10/08/2019   Lewy body dementia without behavioral disturbance ([place])    Prostate cancer ([place]) 06/30/2019   BPH with urinary obstruction 11/06/2018   Acute cholecystitis 09/08/2018   Myalgia due to statin 02/24/2018   Tricompartment osteoarthritis of left knee 11/14/2017   Generalized weakness 04/28/2017   Chronic low back pain 03/12/2017   Knee pain, chronic 10/01/2016   Chronic venous insufficiency 08/30/2016   BPH without obstruction/lower urinary tract symptoms 03/06/2016   Osteoarthritis of  multiple joints 03/06/2016   Degenerative joint disease (DJD) of lumbar spine 03/06/2016   Essential hypertension 03/05/2016   Hyperlipidemia 03/05/2016   Osteoporosis 03/05/2016   GERD (gastroesophageal reflux disease) 03/05/2016   Bilateral lower extremity edema 03/05/2016    Conditions to be addressed/monitored:HTN, HLD, Dementia, and Urinary incontinence and frequent UTI  Care Plan : RNCM: Dementia (Adult)  Updates made by Vanita Ingles, RN since 09/04/2020 12:00 AM     Problem: RNCM: Management of Dementia and Memory Loss   Priority: Medium     Long-Range Goal: RNCM: Management of Dementia and Memory Loss   Start Date: 01/27/2020  Expected End Date: 06/26/2021  This Visit's Progress: On track  Priority: Medium  Note:   Current Barriers:  Knowledge Deficits related to resources available for patient with dementia and memory loss  Lacks caregiver support.  Cognitive Deficits Unable to independently manage changes related to dementia and memory loss  Unable to self administer medications as prescribed Does not adhere to prescribed medication regimen Lacks social connections Unable to perform ADLs independently Unable to perform IADLs independently Does not contact provider office for questions/concerns  Nurse Case Manager Clinical Goal(s):   patient will verbalize understanding of plan for effective management of dementia and memory loss   patient will attend all scheduled medical appointments: no upcoming appointments but the patients daughter knows to call for changes or needs   patient will demonstrate improved adherence to prescribed treatment plan for dementia and memory loss as evidenced bypatient being as independent as possible and working with CCM team to optimize health and well being related to dementia and memory loss.   Interventions:  1:1 collaboration with Randy Hauser, DO regarding development and update of comprehensive plan of care as evidenced  by provider attestation and co-signature Inter-disciplinary care team collaboration (see longitudinal plan of care) Evaluation of current treatment plan related to dementia and memoryt loss  and patient's adherence to plan as established by provider. 03-16-2020: The patients is about the same. The patients daughter states other than a little  complaint of dizziness the patient is doing well. Denies any safety concerns. 05-25-2020: The patients daughter states that she feels the patients dementia is worse. She states he is taking his medications but sometimes it may be at different times that he takes it. She notices that his mobility is different than what it has been and he seems to be walking "stiffer". Denies any falls. The daughter was at home with the patient today. 09-04-2020: The patient per his daughter is stable as far as his dementia. He has not had any new falls and is walking with his walker. He has been to several specialist recently and dealing with a recurrent UTI. The patient has a palliative visit with NP today. The daughter states he has good days and bad days. Denies any acute findings. Will continue to monitor.  Advised patient to call the office for changes in condition or questions  Provided education  to patient re: caregiver burn out and support available. 09-04-2020: Education and ongoing support given to the patients daughter for lewy body dementia. The patients daughter is very active in the patients care. The patients dementia is stable at this time. Will continue to monitor for changes.  Discussed plans with patient for ongoing care management follow up and provided patient with direct contact information for care management team Provided patient with dementia and memory loss  educational materials related to changes in memory. The daughter states that the patient seems to be in a "better mood" right now. 03-16-2020: Denies any changes in memory at this time. About the same. 09-04-2020:  The patient has palliative services coming in. The NP is going out to the home today for a visit. Message sent through secure messaging center for evaluation of a urinary concern. The daughter states that she does not know if she should call the specialist or not. Will continue to monitor. Correspondence sent to the [employer] NP for expressed concern by the daughter.  Will also send in basket message to pcp.  Discussed the OTC benefits through [facility] and the ability to get the patient a phillips life line system for the patient to use. The daughter does not feel this is needed as her mother is with the patient at all times.   Patient Goals/Self-Care Activities , patient will:  - Patient will self administer medications as prescribed Patient will attend all scheduled provider appointments Patient will call pharmacy for medication refills Patient will attend church or other social activities Patient will continue to perform ADL's independently Patient will call provider office for new concerns or questions Patient will work with BSW to address care coordination needs and will continue to work with the clinical team to address health care and disease management related needs.   - action plan for worsening symptoms mutually developed - community resource information provided - pain assessed - pain management plan developed - symptom review completed  Follow Up Plan: Telephone follow up appointment with care management team member scheduled for: 10-09-2020 at 0945 am        Task: Develop Strategies to Manage Behavior Completed 09/04/2020  Outcome: Positive  Note:   Care Management Activities:    - action plan for worsening symptoms mutually developed - community resource information provided - pain assessed - pain management plan developed - symptom review completed        Care Plan : RNCM: Hypertension (Adult)  Updates made by Vanita Ingles, RN since 09/04/2020 12:00 AM      Problem: RNCM: Hypertension (Hypertension)   Priority: Medium     Long-Range Goal: RNCM: Hypertension Monitored   Start Date: 01/27/2020  Expected End Date: 06/26/2021  This Visit's Progress: On track  Priority: Medium  Note:   Objective:  Last practice recorded BP readings:  BP Readings from Last 3 Encounters:  08/25/20 130/70  08/17/20 (!) 156/91  08/09/20 140/75     Most recent eGFR/CrCl: No results found for: EGFR  No components found for: CRCL Current Barriers:  Knowledge Deficits related to basic understanding of hypertension pathophysiology and self care management Knowledge Deficits related to understanding of medications prescribed for management of hypertension Cognitive Deficits Limited Social Support Unable to independently manage HTN Unable to self administer medications as prescribed Does not adhere to prescribed medication regimen Lacks social connections Unable to perform ADLs independently Unable to perform IADLs independently Does not contact provider office for questions/concerns Case Manager Clinical Goal(s):  patient will verbalize understanding of plan for hypertension management  patient will demonstrate improved adherence to prescribed treatment plan for hypertension as evidenced by taking all medications as prescribed, monitoring and recording blood pressure as directed, adhering to low sodium/DASH diet  patient will demonstrate improved health management independence as evidenced by checking blood pressure as directed and notifying PCP if SBP>160 or DBP > 90, taking all medications as prescribe, and adhering to a low sodium diet as discussed. Interventions:  Collaboration with Randy Hauser, DO regarding development and update of comprehensive plan of care as evidenced by provider attestation and co-signature Inter-disciplinary care team collaboration (see longitudinal plan of care) UNABLE to independently:manage HTN Evaluation of current  treatment plan related to hypertension self management and patient's adherence to plan as established by provider. 03-16-2020: The patients daughter is working closely with the pharm D and RNCM to manage blood pressure. The daughter states the patient still has edema and swelling but it is not worse than usual. Will continue to monitor. 05-25-2020: The patients daughter provided several readings for the [facility].  The patients systolic is running 944 to 967 and the diastolic is running 71 to 102. Heart rate is running 69 to 102.  The patients daughter states he is not complaining of headaches or any other issues related to hight blood pressure. 09-04-2020: The patients daughter states his blood pressures are still varying.  He has an appointment coming up for a vascular provider for evaluation of ongoing edema present in his legs. The daughter states she encourages him to elevate his legs and feet as much as possible when sitting. The patient has a hard time with getting compression hose on. Eduction on compression  hose with zippers in them. Did advise the daughter to discuss this with the specialist. The patient has no acute findings related to HTN.  Provided education to patient re: stroke prevention, s/s of heart attack and stroke, DASH diet, complications of uncontrolled blood pressure Reviewed medications with patient and discussed importance of compliance. 09-04-2020: The patient is compliant with medications regimen,, admits he may take at different times but he is taking is medications, also working with the pharm D on a regular basis. Denies any concerns with medication management or cost. The daughter did state that she thought the patient was going to take the COVID vaccine but once she got him in the car he decided he did not want to do it. She feels her mother talked him out of it. She told the patient it was his decision.  Discussed plans with patient for ongoing care management follow up and provided  patient with direct contact information for care management team Advised patient, providing education and rationale, to monitor blood pressure daily and record, calling PCP for findings outside established parameters.  Patient Goals/Self-Care Activities  patient will:  - Self administers medications as prescribed Calls provider office for new concerns, questions, or BP outside discussed parameters Checks BP and records as discussed Follows a low sodium diet/DASH diet - blood pressure trends reviewed: 01-20-2020: 158/100 pulse 62, 01-21-2020 132/91, pulse 86; 01-23-2020 145/97, pulse 80.  The patient is also working with the CCM pharmacist to help with management of blood pressure medications. 05-25-2020: 05/09/2020 146/71 p-81, 05-10-2020: 142/100, p-72, 05-17-2020 150/97, p-73 - depression screen reviewed - home or ambulatory blood pressure monitoring encouraged Follow Up Plan: Telephone follow up appointment with care management team member scheduled for: 10-09-2020 at 0945 am    Task: RNCM: Identify and Monitor  Blood Pressure Elevation Completed 09/04/2020  Outcome: Positive  Note:   Care Management Activities:    - blood pressure trends reviewed - depression screen reviewed - home or ambulatory blood pressure monitoring encouraged        Care Plan : RNCM: HLD management  Updates made by Vanita Ingles, RN since 09/04/2020 12:00 AM     Problem: RNCM: HLD management   Priority: Medium     Long-Range Goal: RNCM: HLD Management   Start Date: 01/27/2020  Expected End Date: 07/26/2021  This Visit's Progress: On track  Priority: Medium  Note:   Current Barriers:  Poorly controlled hyperlipidemia, complicated by memory loss, dementia, elevated blood pressures  Current antihyperlipidemic regimen: Omega 3 1200 mg daily  Most recent lipid panel:  Lab Results  Component Value Date   CHOL 156 07/17/2020   HDL 37 (L) 07/17/2020   LDLCALC 105 (H) 07/17/2020   TRIG 71 07/17/2020   CHOLHDL 4.2  07/17/2020    ASCVD risk enhancing conditions: age >57, HTN,  former smoker Unable to independently manage HLD  Lacks social connections Does not contact provider office for questions/concerns  RN Care Manager Clinical Goal(s):  patient will work with Consulting civil engineer, providers, and care team towards execution of optimized self-health management plan  patient will verbalize understanding of plan for effective management of HLD   patient will work with [place], CCM team and pcp to address needs related to management of HLD   Interventions: Collaboration with Parks Ranger, Devonne Doughty, DO regarding development and update of comprehensive plan of care as evidenced by provider attestation and co-signature Inter-disciplinary care team collaboration (see longitudinal plan of care) Medication review performed; medication list updated in electronic medical record.  Inter-disciplinary care team collaboration (see longitudinal plan of care) Referred to pharmacy team for assistance with HLD medication management Evaluation of current treatment plan related to HLD and patient's adherence to plan as established by provider. 05-25-2020: The patient saw pcp 05-05-2020. The patient is overall doing about the same but has worsening memory issues. The patients daughter is with the patient a lot but is wanting additional help. Referrals are in place and will reach out to LCSW for assistance. 09-04-2020: The patient has seen several providers recently related to UTI. The patient has upcoming appointments with the vascular specialist for evaluation of possible PVD. The patients daughter states he is doing okay just issues with UTI.  He has a planned visit with the NP from [place] today. Will continue to monitor.  Advised patient to call the office with changes or new questions  Provided education to patient re: heart healthy diet. 09-04-2020: The patient is eating well and the patients daughter tries to make sure he  watches sodium and fats. Discussed plans with patient for ongoing care management follow up and provided patient with direct contact information for care management team   Patient Goals/Self-Care Activities: patient will:   - call for medicine refill 2 or 3 days before it runs out - call if I am sick and can't take my medicine - keep a list of all the medicines I take; vitamins and herbals too - learn to read medicine labels - use a pillbox to sort medicine - use an alarm clock or phone to remind me to take my medicine - drink 6 to 8 glasses of water each day - eat 3 to 5 servings of fruits and vegetables each day - eat 5 or 6 small meals each day - limit  fast food meals to no more than 1 per week - manage portion size - prepare main meal at home 3 to 5 days each week - read food labels for fat, fiber, carbohydrates and portion size - be open to making changes - I can manage, know and watch for signs of a heart attack - if I have chest pain, call for help - learn about small changes that will make a big difference - learn my personal risk factors - education plan reviewed and/or amended - empathy and reassurance conveyed - family/caregiver participation in learning encouraged - health literacy screen reviewed - patient's preferred learning methods utilized - privacy ensured - questions encouraged - readiness to learn monitored   Follow Up Plan: Telephone follow up appointment with care management team member scheduled for: 10-09-2020 at 0945 am      Task: RNCM: Identify Deficit and Zearing Completed 09/04/2020  Outcome: Positive  Note:   Care Management Activities:    - education plan reviewed and/or amended - empathy and reassurance conveyed - family/caregiver participation in learning encouraged - health literacy screen reviewed - patient's preferred learning methods utilized - privacy ensured - questions encouraged - readiness to learn monitored         Care Plan : RNCM: Urinary Incontinence (Adult) and frequent UTI  Updates made by Vanita Ingles, RN since 09/04/2020 12:00 AM     Problem: RNCM: Symptom Management (Urinary Incontinence) and UTI   Priority: High     Long-Range Goal: RNCM: Urinary Incontinence Symptoms Manged   Start Date: 09/04/2020  Expected End Date: 09/04/2021  This Visit's Progress: On track  Priority: High  Note:   Current Barriers:  Ineffective Self Health Maintenance in a patient with  Urinary incontinence and frequent UTI Unable to independently manage UTI and urinary incontinence  Does not contact provider office for questions/concerns Clinical Goal(s):  Collaboration with [place] Devonne Doughty, DO regarding development and update of comprehensive plan of care as evidenced by provider attestation and co-signature Inter-disciplinary care team collaboration (see longitudinal plan of care) patient will work with care management team to address care coordination and chronic disease management needs related to Disease Management Educational Needs Care Coordination Other frequent UTI    Interventions:  Evaluation of current treatment plan related to  urinary incontinence and frequent UTI ,  self-management and patient's adherence to plan as established by provider. 09-04-2020: The patient has seen the pcp and specialist recently for frequent UTI and urinary incontinence. The patients daughter states the patient thinks the antibiotics are not working but after talking with the patient and her mother the patients daughter things the burning may be coming from where he has irritation due to wearing depends because of his incontinence.  The palliative NP is going out to the home today and a secure message has been sent to ask her to evaluate the patient and given recommendations. Will also collaborate with pcp. The patients duaghter instructed to call the office for worsening sx and sx or changes. Will continue to  monitor.  Collaboration with Randy Hauser, DO regarding development and update of comprehensive plan of care as evidenced by provider attestation       and co-signature Inter-disciplinary care team collaboration (see longitudinal plan of care) Discussed plans with patient for ongoing care management follow up and provided patient with direct contact information for care management team Self Care Activities:  Patient verbalizes understanding of plan to effectively manage frequent UTI's and urinary incontinence  Self administers medications as prescribed Attends all scheduled provider appointments Calls pharmacy for medication refills Attends church or other social activities Performs ADL's independently Calls provider office for new concerns or questions Patient Goals: bladder-training plan developed - counseling by pharmacist provided - healthy lifestyle promoted - incontinence product use encouraged - medication side effects managed - medication-adherence assessment completed - participation in physical therapy encouraged - practice of bladder-training techniques encouraged - practice of pelvic floor exercises encouraged - response to pharmacologic therapy monitored - strategies to manage constipation promoted - strategies to manage symptom triggers promoted - symptom triggers identified    Follow Up Plan: Telephone follow up appointment with care management team member scheduled for: 10-09-2020 at 0945 am    Task: RNCM: Alleviate Barriers to Incontinence Treatment Completed 09/04/2020  Outcome: Positive  Note:   Care Management Activities:    - bladder-training plan developed - counseling by pharmacist provided - healthy lifestyle promoted - incontinence product use encouraged - medication side effects managed - medication-adherence assessment completed - participation in physical therapy encouraged - practice of bladder-training techniques encouraged -  practice of pelvic floor exercises encouraged - response to pharmacologic therapy monitored - strategies to manage constipation promoted - strategies to manage symptom triggers promoted - symptom triggers identified         Plan:Telephone follow up appointment with care management team member scheduled for:  10-09-2020 at [address] am  Noreene Larsson RN, MSN, [facility] Mobile: [phone]

## 2020-09-04 NOTE — Patient Instructions (Signed)
Visit Information  PATIENT GOALS:  Goals Addressed             This Visit's Progress    RNCM: Track Symptoms-Urinary Incontinence       Timeframe:  Long-Range Goal Priority:  High Start Date:       09-04-2020                      Expected End Date:     09-04-2021                  Follow Up Date 10/09/2020    - track number of times I do not get to the bathroom in time - track when, how much I go    Why is this important?   There are many reasons you may leak urine (pee). Keeping a diary of symptoms can help you figure out why. This will help you and your doctor come up with a treatment plan.    Notes: 09-04-2020: The patients daughter states that the patient can't get to the bathroom quick enough so he is wearing depends. She is wondering if this is why the patient is complaining of discomfort when he urinates. He has been on 3 different antibiotics. Will reach out to the palliative NP that has a home visit today and see if she could check the place out today. Will collaborate with pcp and NP about information provided by the daughter.         Patient verbalizes understanding of instructions provided today and agrees to view in [place].   Telephone follow up appointment with care management team member scheduled for: 10-09-2020 at [place] am  Noreene Larsson RN, MSN, [place] Mobile: [phone]

## 2020-09-06 ENCOUNTER — Ambulatory Visit: Payer: Medicare Other | Admitting: Pharmacist

## 2020-09-06 DIAGNOSIS — E782 Mixed hyperlipidemia: Secondary | ICD-10-CM | POA: Diagnosis not present

## 2020-09-06 DIAGNOSIS — I10 Essential (primary) hypertension: Secondary | ICD-10-CM

## 2020-09-06 DIAGNOSIS — F0280 Dementia in other diseases classified elsewhere without behavioral disturbance: Secondary | ICD-10-CM | POA: Diagnosis not present

## 2020-09-06 DIAGNOSIS — G3183 Dementia with Lewy bodies: Secondary | ICD-10-CM | POA: Diagnosis not present

## 2020-09-06 DIAGNOSIS — R309 Painful micturition, unspecified: Secondary | ICD-10-CM

## 2020-09-06 NOTE — Chronic Care Management (AMB) (Signed)
Chronic Care Management Pharmacy Note  09/06/2020 Name:  Randy [name] MRN:  [id number] DOB:  [birth year]   Subjective: Randy [name] is an 86 y.o. year old male who is a primary patient of Randy Hauser, DO.  The CCM team was consulted for assistance with disease management and care coordination needs.    Engaged with daughter by telephone for follow up visit in response to provider referral for pharmacy case management and/or care coordination services.   Consent to Services:  The patient was given information about Chronic Care Management services, agreed to services, and gave verbal consent prior to initiation of services.  Please see initial visit note for detailed documentation.   Patient Care Team: Randy Hauser, DO as PCP - General (Family Medicine) Kate Sable, MD as PCP - Cardiology (Cardiology) Curley Spice, Virl Diamond, RPH-CPP as Pharmacist Hall Busing [name], RN as Case Manager Rebekah [place]field, LCSW as Social Worker (Licensed Clinical Social Worker) Tamala Julian, Jonette Eva, NP as Nurse Practitioner (Hospice and Palliative Medicine) Vladimir Crofts, MD as Consulting Physician (Neurology)  Recent office visits: Office Visit with PCP on 8/3 for dysuria  Recent consult visits: Office Visit with [facility] on 8/11 for recurrent UTI Office Visit with [facility] on 8/19 for follow up [place] Visit on 8/29  Hospital visits: None in previous 6 months  Objective:  Lab Results  Component Value Date   CREATININE 0.90 07/17/2020   CREATININE 0.98 05/09/2020   CREATININE 0.83 10/12/2019       Component Value Date/Time   CHOL 156 07/17/2020 0851   CHOL 151 04/17/2015 0826   TRIG 71 07/17/2020 0851   TRIG 116 04/17/2015 0826   HDL 37 (L) 07/17/2020 0851   HDL 32 (A) 04/17/2015 0826   CHOLHDL 4.2 07/17/2020 0851   VLDL 14 07/17/2020 0851   LDLCALC 105 (H) 07/17/2020 0851   LDLCALC 121 (H)  06/10/2018 0800    Social History   Tobacco Use  Smoking Status Former   Packs/day: 1.00   Years: 12.00   Pack years: 12.00   Types: Cigarettes   Quit date: 1968   Years since quitting: 54.7  Smokeless Tobacco Never   BP Readings from Last 3 Encounters:  08/25/20 130/70  08/17/20 (!) 156/91  08/09/20 140/75   Pulse Readings from Last 3 Encounters:  08/25/20 67  08/17/20 69  08/09/20 76   Wt Readings from Last 3 Encounters:  08/25/20 218 lb (98.9 kg)  08/17/20 217 lb (98.4 kg)  08/09/20 217 lb 6.4 oz (98.6 kg)    Assessment: Review of patient past medical history, allergies, medications, health status, including review of consultants reports, laboratory and other test data, was performed as part of comprehensive evaluation and provision of chronic care management services.   SDOH:  (Social Determinants of Health) assessments and interventions performed: none   CCM Care Plan  Allergies  Allergen Reactions   Antihistamines, Chlorpheniramine-Type Other (See Comments)    Can't urinate    Medications Reviewed Today     Reviewed by [name], RPH-CPP (Pharmacist) on 09/06/20 at [place] List Status: <None>   Medication Order Taking? Sig Documenting Provider Last Dose Status Informant  acetaminophen (TYLENOL) 500 MG tablet [id number]  Take 1,000 mg by mouth every 6 (six) hours as needed. [provider]  Active   aspirin 81 MG chewable tablet [id number]  Chew 81 mg by mouth daily. [provider]  Active  carbidopa-levodopa (SINEMET CR) 50-200 MG tablet [id number] Yes Take 1 tablet by mouth 4 (four) times daily. [provider] Taking Active   cholecalciferol (VITAMIN D) 1000 units tablet [id number]  Take 1,000 Units by mouth daily. [provider]  Active Other  Coenzyme Q10 (CO Q 10) 100 MG CAPS [id number]  Take 100 mg by mouth daily.  [provider]  Active Other  CRANBERRY PO [id number] Yes Take by mouth. [provider] Taking Active   diclofenac Sodium (VOLTAREN) 1 % GEL [id number]  Apply 2 g topically 4 (four) times daily as needed. To knee arthritis pain Randy Hauser, DO  Active Pharmacy Records  enalapril (VASOTEC) 10 MG tablet [id number] Yes Take 1 tablet (10 mg total) by mouth daily. Randy Hauser, DO Taking Active   fluticasone (FLONASE) 50 MCG/ACT nasal spray [id number]  Place 2 sprays into both nostrils daily as needed for allergies.  [provider]  Active Other           Med Note [name], [name]   Mon Oct 26, 2018  1:07 PM)    furosemide (LASIX) 20 MG tablet [id number] Yes Take 1 tablet (20 mg total) by mouth every other day. Kate Sable, MD Taking Active   Multiple Vitamin (MULTIVITAMIN WITH MINERALS) TABS tablet [id number]  Take 1 tablet by mouth daily. [name], MD  Active   nitrofurantoin (MACRODANTIN) 50 MG capsule [id number] Yes Take 1 capsule (50 mg total) by mouth daily. [name], MD Taking Active   Omega-3 Fatty Acids (FISH OIL) 1200 MG CAPS [id number]  Take 1,200 mg by mouth 2 (two) times daily.  [provider]  Active Other  omeprazole (PRILOSEC) 20 MG capsule [id number]  Take 20 mg by mouth daily. [provider]  Active Other  rivastigmine (EXELON) 1.5 MG capsule [id number] Yes Take 1 capsule by mouth 2 (two) times daily with a meal. [provider] Taking Active   sertraline (ZOLOFT) 50 MG tablet [id number] Yes Take 50 mg by mouth at bedtime. [provider] Taking Active   traMADol (ULTRAM) 50 MG tablet [id number]  Take 1 tablet (50 mg total) by mouth every 8 (eight) hours as needed. Randy Hauser, DO  Active   vitamin C (ASCORBIC ACID) 500 MG tablet [id number]  Take 500 mg by mouth daily. [provider]  Active Other  zinc sulfate 220 (50 Zn) MG capsule [id number]  Take 1 capsule (220 mg total) by mouth daily. [name], MD  Active             Patient Active  Problem List   Diagnosis Date Noted   Bilateral primary osteoarthritis of knee 02/15/2020   Pressure injury of buttock, stage 1    Delirium    COVID-19 virus infection 10/08/2019   Unable to care for self 10/08/2019   Caregiver unable to cope 10/08/2019   Recurrent falls 10/08/2019   Lewy body dementia without behavioral disturbance ([place])    Prostate cancer ([place]) 06/30/2019   BPH with urinary obstruction 11/06/2018   Acute cholecystitis 09/08/2018   Myalgia due to statin 02/24/2018   Tricompartment osteoarthritis of left knee 11/14/2017   Generalized weakness 04/28/2017   Chronic low back pain 03/12/2017   Knee pain, chronic 10/01/2016   Chronic venous insufficiency 08/30/2016   BPH without obstruction/lower urinary tract symptoms 03/06/2016   Osteoarthritis of multiple joints 03/06/2016   Degenerative joint disease (DJD) of lumbar spine 03/06/2016   Essential hypertension 03/05/2016  Hyperlipidemia 03/05/2016   Osteoporosis 03/05/2016   GERD (gastroesophageal reflux disease) 03/05/2016   Bilateral lower extremity edema 03/05/2016    Immunization History  Administered Date(s) Administered   Fluad Quad(high Dose 65+) 11/07/2018, 11/10/2019   Influenza, High Dose Seasonal PF 10/01/2016, 10/07/2017   Pneumococcal Conjugate-13 09/11/2016   Pneumococcal Polysaccharide-23 10/31/2017   Tdap 03/05/2016    Conditions to be addressed/monitored: HTN, HLD, dementia  Care Plan : PharmD - Medication Managment  Updates made by [name], RPH-CPP since 09/06/2020 12:00 AM     Problem: Disease Progression      Long-Range Goal: Disease Progression Prevented or Minimized   Start Date: 01/19/2020  Expected End Date: 04/18/2020  This Visit's Progress: On track  Recent Progress: On track  Priority: High  Note:   Current Barriers:  Chronic Disease Management support, education, and care coordination needs related to hypertension, osteoarthritis, hyperlipidemia, insomnia and  memory loss Cognitive Deficits  Pharmacist Clinical Goal(s):  Over the next 90 days, caregiver/patient will adhere to plan to optimize therapeutic regimen for hypertension as evidenced by report of adherence to recommended medication management changes through collaboration with PharmD and provider.   Interventions: 1:1 collaboration with Randy Hauser, DO regarding development and update of comprehensive plan of care as evidenced by provider attestation and co-signature Inter-disciplinary care team collaboration (see longitudinal plan of care) Perform chart review Office Visit with PCP on 8/3 for dysuria. Provider advised: Start Cipro '500mg'$  twice a day for 7 days Office Visit with [facility] on 8/11 for recurrent UTI Recommended starting cranberry tablets for UTI prevention Amoxicillin twice daily x10 days for suspected acute UTI, follow-up culture results Nitrofurantoin daily prophylaxis x3 months RTC 3 months Office Visit with [facility] on 8/19 for follow up Referred patient to vein and vascular clinic for lower extremity edema Palliative Care Home Visit on 8/29 Today daughter reports patient completed course of amoxicillin and is now taking both nitrofurantoin daily and cranberry supplement as directed by Urologist for UTI prevention From review chart, note on 8/29, Palliative Care NP noted patient complaining of redness on penis and one time burning with urination. Provider examined patient and advised that should the burning not improve by Wednesday or have fevers then to make an appt to be seen and give urine sample to his primary care. Review this instruction with daughter today. States patient is not yet awake, but that she will check on him when he's up and if he is having any symptoms, will follow up with Urology or PCP.    Hypertension Patient followed by [facility] Reports patient taking: Enalapril 10 mg daily in the  morning Furosemide 20 mg on Mondays, Wednesdays and Fridays Reports has been monitoring home BP and keeping log, but daughter does not have record with her today Encourage caregiver to continue to monitor home BP, keep log, bring this record to medical appointments and contact office for readings outside of established parameters Reports received call from [facility] and will call back to schedule an appointment   Hyperlipidemia: Uncontrolled; current treatment: none Have counseled on low-cholesterol diet, including having well-balanced meals that include vegetables and fruit and limiting saturated fats   Medication Adherence Note patient using weekly pillbox with slots for four times daily dosing Patient continues to need assistance from family for remembering four times/day dosing Have discussed with caregiver additional strategies for using medication alarm as adherence aid  Reports that she and her mom manage patient's  as needed medications, including tramadol  Patient Goals/Self-Care Activities Over the next 90 days, caregiver/patient will:  - take medications as prescribed - check blood pressure, document, and provide at future appointments  Follow Up Plan: Telephone follow up appointment with care management team member scheduled for: 9/26 at 8:30 am     Patient's preferred pharmacy is:  [facility], [address] [place] [address] Phone: [phone] Fax: [phone]  [id number] [place] [id number]: [phone]: [phone]  Follow Up:  Caregiver agrees to Care Plan and Follow-up.  EWallace Cullens PharmD, BPara [place] CPP Clinical Pharmacist [phone]

## 2020-09-06 NOTE — Patient Instructions (Signed)
Visit Information  PATIENT GOALS:  Goals Addressed             This Visit's Progress    Pharmacy Goals       Please continue to monitor home blood pressure and keep log of results and have this record to review during our next appointment.    Feel free to call me with any questions or concerns. I look forward to our next call!  Wallace Cullens, PharmD, [place] [phone]        Caregiver verbalized understanding of instructions, educational materials, and care plan provided today and declined offer to receive copy of patient instructions, educational materials, and care plan.   Telephone follow up appointment with care management team member scheduled for: 10/02/2020 at 8:30 AM

## 2020-09-12 ENCOUNTER — Other Ambulatory Visit: Payer: Medicare Other | Admitting: Primary Care

## 2020-09-14 ENCOUNTER — Encounter: Payer: Self-pay | Admitting: Urology

## 2020-09-14 ENCOUNTER — Other Ambulatory Visit: Payer: Self-pay

## 2020-09-14 ENCOUNTER — Ambulatory Visit: Payer: Medicare Other | Admitting: Urology

## 2020-09-14 VITALS — BP 164/87 | HR 69

## 2020-09-14 DIAGNOSIS — N138 Other obstructive and reflux uropathy: Secondary | ICD-10-CM

## 2020-09-14 DIAGNOSIS — N390 Urinary tract infection, site not specified: Secondary | ICD-10-CM | POA: Diagnosis not present

## 2020-09-14 DIAGNOSIS — N401 Enlarged prostate with lower urinary tract symptoms: Secondary | ICD-10-CM | POA: Diagnosis not present

## 2020-09-14 LAB — BLADDER SCAN AMB NON-IMAGING

## 2020-09-14 MED ORDER — SULFAMETHOXAZOLE-TRIMETHOPRIM 800-160 MG PO TABS: 1.0000 | ORAL_TABLET | Freq: Two times a day (BID) | ORAL | 0 refills | Status: DC

## 2020-09-14 NOTE — Patient Instructions (Signed)

## 2020-09-14 NOTE — Progress Notes (Signed)
   09/14/2020 4:11 PM   Randy [name] [birth year] [id number]  Reason for visit: Follow up BPH, recurrent UTIs, dysuria  HPI: 86 year old male with Lewy body dementia who presented to me in fall 2020 with Foley dependent urinary retention, and ultimately underwent a HOLEP with removal of 33 g of tissue showing <5% involvement with prostate adenocarcinoma Gleason score 4+3=7.  PSA preop was 2.7, and postop was 0.5, then 0.6 most recently.  He had been doing well with low PVRs, then developed an Enterococcus UTI in April 2022 treated with PCP and resolved with antibiotics.  He had another episode of UTI symptoms with dysuria and suspicious dipstick urinalysis but microscopic and culture were not sent, and he was treated with Cipro with no improvement in his urinary symptoms.  At our last visit urinalysis was suspicious, and he was started on amoxicillin with nitrofurantoin prophylaxis to follow for his persistent dysuria.  Urine culture and atypicals were ultimately negative.  He continues to report intermittent dysuria despite completing the course of amoxicillin.  He denies significant urgency or frequency, and continues to have a strong urinary stream.  We discussed possible etiologies including UTI, prostatitis, stricture/bladder neck contracture, new bladder pathology, and less likely malignancy.  He was unable to urinate today for urine specimen, and bladder scan is only 200 mL and he does not have the urge to void.   I recommended Bactrim DS twice daily x2 weeks for possible prostatitis with negative cultures, with close follow-up in 2 to 3 weeks for cystoscopy.  If he has resolution of his urinary symptoms on Bactrim, we can cancel the cystoscopy.  [name], [facility] [address] [place], [place] [address] [phone]

## 2020-09-26 ENCOUNTER — Telehealth: Payer: Self-pay | Admitting: *Deleted

## 2020-09-26 NOTE — Telephone Encounter (Signed)
Patient wife called in today and states he is still having pain . Dr. Diamantina Providence put  Bactrim DS twice daily x2 weeks for possible prostatitis with negative cultures, with close follow-up in 2 to 3 weeks for cystoscopy. No fever or chili's.

## 2020-09-27 NOTE — Telephone Encounter (Signed)
He should keep his upcoming appointment for cystoscopy for further evaluation of his symptoms. If he develops fever, chills, nausea, or vomiting, he should follow up with [place] sooner.

## 2020-09-27 NOTE — Telephone Encounter (Signed)
OK per DPR, [facility] notifying patient of information below.

## 2020-09-28 ENCOUNTER — Other Ambulatory Visit: Payer: Self-pay | Admitting: Family Medicine

## 2020-09-28 DIAGNOSIS — M1711 Unilateral primary osteoarthritis, right knee: Secondary | ICD-10-CM

## 2020-09-28 DIAGNOSIS — G8929 Other chronic pain: Secondary | ICD-10-CM

## 2020-09-28 DIAGNOSIS — I10 Essential (primary) hypertension: Secondary | ICD-10-CM

## 2020-09-28 DIAGNOSIS — M1712 Unilateral primary osteoarthritis, left knee: Secondary | ICD-10-CM

## 2020-10-02 ENCOUNTER — Telehealth: Payer: Medicare Other

## 2020-10-02 ENCOUNTER — Telehealth: Payer: Self-pay | Admitting: Pharmacist

## 2020-10-02 NOTE — Telephone Encounter (Signed)
  Chronic Care Management   Outreach Note  10/02/2020 Name: VISHWA DAIS MRN: [id number] DOB: 10/09/1934  Referred by: [name], DO Reason for referral : No chief complaint on file.   Was unable to reach patient/daughter via telephone today and have left HIPAA compliant voicemail asking patient to return my call.   Follow Up Plan: Will collaborate with Care Guide to outreach to schedule follow up with me  Wallace Cullens, PharmD, [place] [facility] [phone]

## 2020-10-06 ENCOUNTER — Ambulatory Visit: Payer: Medicare Other | Admitting: Physician Assistant

## 2020-10-09 ENCOUNTER — Telehealth: Payer: Medicare Other | Admitting: General Practice

## 2020-10-09 ENCOUNTER — Other Ambulatory Visit: Payer: Self-pay | Admitting: *Deleted

## 2020-10-09 ENCOUNTER — Ambulatory Visit (INDEPENDENT_AMBULATORY_CARE_PROVIDER_SITE_OTHER): Payer: Medicare Other

## 2020-10-09 DIAGNOSIS — I10 Essential (primary) hypertension: Secondary | ICD-10-CM

## 2020-10-09 DIAGNOSIS — F0280 Dementia in other diseases classified elsewhere without behavioral disturbance: Secondary | ICD-10-CM

## 2020-10-09 DIAGNOSIS — N138 Other obstructive and reflux uropathy: Secondary | ICD-10-CM

## 2020-10-09 DIAGNOSIS — E782 Mixed hyperlipidemia: Secondary | ICD-10-CM

## 2020-10-09 DIAGNOSIS — R413 Other amnesia: Secondary | ICD-10-CM

## 2020-10-09 DIAGNOSIS — R309 Painful micturition, unspecified: Secondary | ICD-10-CM

## 2020-10-09 DIAGNOSIS — N400 Enlarged prostate without lower urinary tract symptoms: Secondary | ICD-10-CM

## 2020-10-09 DIAGNOSIS — N3001 Acute cystitis with hematuria: Secondary | ICD-10-CM

## 2020-10-09 NOTE — Chronic Care Management (AMB) (Signed)
Chronic Care Management   CCM RN Visit Note  10/09/2020 Name: Randy [name] MRN: [id number] DOB: 06/03/1934  Subjective: Randy [name] is a 86 y.o. year old male who is a primary care patient of Randy Hauser, DO. The care management team was consulted for assistance with disease management and care coordination needs.    Engaged with patient by telephone for follow up visit in response to provider referral for case management and/or care coordination services. Spoke to the patients daughter, Randy [name].  Consent to Services:  The patient was given information about Chronic Care Management services, agreed to services, and gave verbal consent prior to initiation of services.  Please see initial visit note for detailed documentation.   Patient agreed to services and verbal consent obtained.   Assessment: Review of patient past medical history, allergies, medications, health status, including review of consultants reports, laboratory and other test data, was performed as part of comprehensive evaluation and provision of chronic care management services.   SDOH (Social Determinants of Health) assessments and interventions performed:  SDOH Interventions    Flowsheet Row Most Recent Value  SDOH Interventions   Food Insecurity Interventions Intervention Not Indicated  Financial Strain Interventions Intervention Not Indicated  Intimate Partner Violence Interventions Intervention Not Indicated  Social Connections Interventions Intervention Not Indicated  Transportation Interventions Intervention Not Indicated        CCM Care Plan  Allergies  Allergen Reactions   Antihistamines, Chlorpheniramine-Type Other (See Comments)    Can't urinate    Outpatient Encounter Medications as of 10/09/2020  Medication Sig   acetaminophen (TYLENOL) 500 MG tablet Take 1,000 mg by mouth every 6 (six) hours as needed.   aspirin 81 MG chewable tablet Chew 81 mg by mouth daily.   carbidopa-levodopa  (SINEMET CR) 50-200 MG tablet Take 1 tablet by mouth 4 (four) times daily.   cholecalciferol (VITAMIN D) 1000 units tablet Take 1,000 Units by mouth daily.   Coenzyme Q10 (CO Q 10) 100 MG CAPS Take 100 mg by mouth daily.    CRANBERRY PO Take by mouth.   diclofenac Sodium (VOLTAREN) 1 % GEL APPLY 2 G TOPICALLY 4 (FOUR) TIMES DAILY AS NEEDED. TO KNEE ARTHRITIS PAIN   enalapril (VASOTEC) 10 MG tablet Take 1 tablet (10 mg total) by mouth daily.   fluticasone (FLONASE) 50 MCG/ACT nasal spray Place 2 sprays into both nostrils daily as needed for allergies.    furosemide (LASIX) 20 MG tablet Take 1 tablet (20 mg total) by mouth every other day.   Multiple Vitamin (MULTIVITAMIN WITH MINERALS) TABS tablet Take 1 tablet by mouth daily.   nitrofurantoin (MACRODANTIN) 50 MG capsule Take 1 capsule (50 mg total) by mouth daily.   Omega-3 Fatty Acids (FISH OIL) 1200 MG CAPS Take 1,200 mg by mouth 2 (two) times daily.    omeprazole (PRILOSEC) 20 MG capsule Take 20 mg by mouth daily.   rivastigmine (EXELON) 1.5 MG capsule Take 1 capsule by mouth 2 (two) times daily with a meal.   sertraline (ZOLOFT) 50 MG tablet Take 50 mg by mouth at bedtime.   sulfamethoxazole-trimethoprim (BACTRIM DS) 800-160 MG tablet Take 1 tablet by mouth 2 (two) times daily.   traMADol (ULTRAM) 50 MG tablet Take 1 tablet (50 mg total) by mouth every 8 (eight) hours as needed.   vitamin C (ASCORBIC ACID) 500 MG tablet Take 500 mg by mouth daily.   zinc sulfate 220 (50 Zn) MG capsule Take 1 capsule (220 mg total) by mouth  daily.   No facility-administered encounter medications on file as of 10/09/2020.    Patient Active Problem List   Diagnosis Date Noted   Bilateral primary osteoarthritis of knee 02/15/2020   Pressure injury of buttock, stage 1    Delirium    COVID-19 virus infection 10/08/2019   Unable to care for self 10/08/2019   Caregiver unable to cope 10/08/2019   Recurrent falls 10/08/2019   Lewy body dementia without  behavioral disturbance ([place])    Prostate cancer ([place]) 06/30/2019   BPH with urinary obstruction 11/06/2018   Acute cholecystitis 09/08/2018   Myalgia due to statin 02/24/2018   Tricompartment osteoarthritis of left knee 11/14/2017   Generalized weakness 04/28/2017   Chronic low back pain 03/12/2017   Knee pain, chronic 10/01/2016   Chronic venous insufficiency 08/30/2016   BPH without obstruction/lower urinary tract symptoms 03/06/2016   Osteoarthritis of multiple joints 03/06/2016   Degenerative joint disease (DJD) of lumbar spine 03/06/2016   Essential hypertension 03/05/2016   Hyperlipidemia 03/05/2016   Osteoporosis 03/05/2016   GERD (gastroesophageal reflux disease) 03/05/2016   Bilateral lower extremity edema 03/05/2016    Conditions to be addressed/monitored:HTN, HLD, Depression, and urinary incontinence and issues  Care Plan : RNCM: Dementia (Adult)  Updates made by Vanita Ingles, RN since 10/09/2020 12:00 AM     Problem: RNCM: Management of Dementia and Memory Loss   Priority: Medium     Long-Range Goal: RNCM: Management of Dementia and Memory Loss   Start Date: 01/27/2020  Expected End Date: 06/26/2021  This Visit's Progress: On track  Recent Progress: On track  Priority: Medium  Note:   Current Barriers:  Knowledge Deficits related to resources available for patient with dementia and memory loss  Lacks caregiver support.  Cognitive Deficits Unable to independently manage changes related to dementia and memory loss  Unable to self administer medications as prescribed Does not adhere to prescribed medication regimen Lacks social connections Unable to perform ADLs independently Unable to perform IADLs independently Does not contact provider office for questions/concerns  Nurse Case Manager Clinical Goal(s):   patient will verbalize understanding of plan for effective management of dementia and memory loss   patient will attend all scheduled medical  appointments: no upcoming appointments but the patients daughter knows to call for changes or needs   patient will demonstrate improved adherence to prescribed treatment plan for dementia and memory loss as evidenced bypatient being as independent as possible and working with CCM team to optimize health and well being related to dementia and memory loss.   Interventions:  1:1 collaboration with Randy Hauser, DO regarding development and update of comprehensive plan of care as evidenced by provider attestation and co-signature Inter-disciplinary care team collaboration (see longitudinal plan of care) Evaluation of current treatment plan related to dementia and memoryt loss  and patient's adherence to plan as established by provider. 03-16-2020: The patients is about the same. The patients daughter states other than a little  complaint of dizziness the patient is doing well. Denies any safety concerns. 05-25-2020: The patients daughter states that she feels the patients dementia is worse. She states he is taking his medications but sometimes it may be at different times that he takes it. She notices that his mobility is different than what it has been and he seems to be walking "stiffer". Denies any falls. The daughter was at home with the patient today. 09-04-2020: The patient per his daughter is stable as far as his dementia. He  has not had any new falls and is walking with his walker. He has been to several specialist recently and dealing with a recurrent UTI. The patient has a palliative visit with NP today. The daughter states he has good days and bad days. Denies any acute findings. Will continue to monitor. 10-09-2020: The patients daughter states that he seems to be worse when he is in pain or there is something going on with his health.  Says the patient is stiff when walking. Denies any falls. Will continue to monitor. Advised patient to call the office for changes in condition or questions   Provided education to patient re: caregiver burn out and support available. 10-09-2020: Education and ongoing support given to the patients daughter for lewy body dementia. The patients daughter is very active in the patients care. The patients dementia is stable at this time. Will continue to monitor for changes.  Discussed plans with patient for ongoing care management follow up and provided patient with direct contact information for care management team Provided patient with dementia and memory loss  educational materials related to changes in memory. The daughter states that the patient seems to be in a "better mood" right now. 03-16-2020: Denies any changes in memory at this time. About the same. 09-04-2020: The patient has palliative services coming in. The NP is going out to the home today for a visit. Message sent through secure messaging center for evaluation of a urinary concern. The daughter states that she does not know if she should call the specialist or not. Will continue to monitor. Correspondence sent to the [facility] NP for expressed concern by the daughter.  Will also send in basket message to pcp. 10-09-2020: The patients daughter states that the patient seems to be some worse but feels it is due to the urinary symptoms going on right now. The patients daughter states the NP saw the patient and she is thankful for more eyes being on the patient. The patient had stopped taking his blood pressures but the daughter has him doing this again. Is hopeful for things to get better since he has a urology appointment tomorrow with a procedure.  Discussed the OTC benefits through [facility] and the ability to get the patient a phillips life line system for the patient to use. The daughter does not feel this is needed as her mother is with the patient at all times.   Patient Goals/Self-Care Activities , patient will:  - Patient will self administer medications as prescribed Patient will attend all  scheduled provider appointments Patient will call pharmacy for medication refills Patient will attend church or other social activities Patient will continue to perform ADL's independently Patient will call provider office for new concerns or questions Patient will work with BSW to address care coordination needs and will continue to work with the clinical team to address health care and disease management related needs.   - action plan for worsening symptoms mutually developed - community resource information provided - pain assessed - pain management plan developed - symptom review completed  Follow Up Plan: Telephone follow up appointment with care management team member scheduled for: 11-13-2020 at 0945 am        Care Plan : RNCM: Hypertension (Adult)  Updates made by Vanita Ingles, RN since 10/09/2020 12:00 AM     Problem: RNCM: Hypertension (Hypertension)   Priority: Medium     Long-Range Goal: RNCM: Hypertension Monitored   Start Date: 01/27/2020  Expected End Date: 06/26/2021  This Visit's Progress: On track  Recent Progress: On track  Priority: Medium  Note:   Objective:  Last practice recorded BP readings:  BP Readings from Last 3 Encounters:  09/14/20 (!) 164/87  08/25/20 130/70  08/17/20 (!) 156/91     Most recent eGFR/CrCl: No results found for: EGFR  No components found for: CRCL Current Barriers:  Knowledge Deficits related to basic understanding of hypertension pathophysiology and self care management Knowledge Deficits related to understanding of medications prescribed for management of hypertension Cognitive Deficits Limited Social Support Unable to independently manage HTN Unable to self administer medications as prescribed Does not adhere to prescribed medication regimen Lacks social connections Unable to perform ADLs independently Unable to perform IADLs independently Does not contact provider office for questions/concerns Case Manager Clinical  Goal(s):   patient will verbalize understanding of plan for hypertension management  patient will demonstrate improved adherence to prescribed treatment plan for hypertension as evidenced by taking all medications as prescribed, monitoring and recording blood pressure as directed, adhering to low sodium/DASH diet  patient will demonstrate improved health management independence as evidenced by checking blood pressure as directed and notifying PCP if SBP>160 or DBP > 90, taking all medications as prescribe, and adhering to a low sodium diet as discussed. Interventions:  Collaboration with Randy Hauser, DO regarding development and update of comprehensive plan of care as evidenced by provider attestation and co-signature Inter-disciplinary care team collaboration (see longitudinal plan of care) UNABLE to independently:manage HTN Evaluation of current treatment plan related to hypertension self management and patient's adherence to plan as established by provider. 03-16-2020: The patients daughter is working closely with the pharm D and RNCM to manage blood pressure. The daughter states the patient still has edema and swelling but it is not worse than usual. Will continue to monitor. 05-25-2020: The patients daughter provided several readings for the [facility].  The patients systolic is running 595 to 638 and the diastolic is running 71 to 102. Heart rate is running 69 to 102.  The patients daughter states he is not complaining of headaches or any other issues related to hight blood pressure. 09-04-2020: The patients daughter states his blood pressures are still varying.  He has an appointment coming up for a vascular provider for evaluation of ongoing edema present in his legs. The daughter states she encourages him to elevate his legs and feet as much as possible when sitting. The patient has a hard time with getting compression hose on. Eduction on compression  hose with zippers in them. Did advise the  daughter to discuss this with the specialist. The patient has no acute findings related to HTN. 10-09-2020: The patient still having fluctuations in blood pressures. Likely in part due to pain he is experiencing due to urinary sx and sx. The patient had stopped taking his blood pressures and his daughter has encouraged him to start back again. Education and support given. Will continue to monitor.  Provided education to patient re: stroke prevention, s/s of heart attack and stroke, DASH diet, complications of uncontrolled blood pressure Reviewed medications with patient and discussed importance of compliance. 09-04-2020: The patient is compliant with medications regimen,, admits he may take at different times but he is taking is medications, also working with the pharm D on a regular basis. Denies any concerns with medication management or cost. The daughter did state that she thought the patient was going to take the COVID vaccine but once she got him in the car he  decided he did not want to do it. She feels her mother talked him out of it. She told the patient it was his decision. 10-09-2020: Endorses compliance with medications at this time. Will continue to monitor.  Discussed plans with patient for ongoing care management follow up and provided patient with direct contact information for care management team Advised patient, providing education and rationale, to monitor blood pressure daily and record, calling PCP for findings outside established parameters.  Patient Goals/Self-Care Activities  patient will:  - Self administers medications as prescribed Calls provider office for new concerns, questions, or BP outside discussed parameters Checks BP and records as discussed Follows a low sodium diet/DASH diet - blood pressure trends reviewed: 01-20-2020: 158/100 pulse 62, 01-21-2020 132/91, pulse 86; 01-23-2020 145/97, pulse 80.  The patient is also working with the CCM pharmacist to help with management of  blood pressure medications. 05-25-2020: 05/09/2020 146/71 p-81, 05-10-2020: 142/100, p-72, 05-17-2020 150/97, p-73 - depression screen reviewed - home or ambulatory blood pressure monitoring encouraged Follow Up Plan: Telephone follow up appointment with care management team member scheduled for: 11-13-2020 at 0945 am    Care Plan : RNCM: HLD management  Updates made by Vanita Ingles, RN since 10/09/2020 12:00 AM     Problem: RNCM: HLD management   Priority: Medium     Long-Range Goal: RNCM: HLD Management   Start Date: 01/27/2020  Expected End Date: 07/26/2021  This Visit's Progress: On track  Recent Progress: On track  Priority: Medium  Note:   Current Barriers:  Poorly controlled hyperlipidemia, complicated by memory loss, dementia, elevated blood pressures  Current antihyperlipidemic regimen: Omega 3 1200 mg daily  Most recent lipid panel:  Lab Results  Component Value Date   CHOL 156 07/17/2020   HDL 37 (L) 07/17/2020   LDLCALC 105 (H) 07/17/2020   TRIG 71 07/17/2020   CHOLHDL 4.2 07/17/2020    ASCVD risk enhancing conditions: age >37, HTN,  former smoker Unable to independently manage HLD  Lacks social connections Does not contact provider office for questions/concerns  RN Care Manager Clinical Goal(s):  patient will work with Consulting civil engineer, providers, and care team towards execution of optimized self-health management plan  patient will verbalize understanding of plan for effective management of HLD   patient will work with [place], CCM team and pcp to address needs related to management of HLD   Interventions: Collaboration with Parks Ranger, Devonne Doughty, DO regarding development and update of comprehensive plan of care as evidenced by provider attestation and co-signature Inter-disciplinary care team collaboration (see longitudinal plan of care) Medication review performed; medication list updated in electronic medical record.  Inter-disciplinary care team collaboration  (see longitudinal plan of care) Referred to pharmacy team for assistance with HLD medication management Evaluation of current treatment plan related to HLD and patient's adherence to plan as established by provider. 05-25-2020: The patient saw pcp 05-05-2020. The patient is overall doing about the same but has worsening memory issues. The patients daughter is with the patient a lot but is wanting additional help. Referrals are in place and will reach out to LCSW for assistance. 09-04-2020: The patient has seen several providers recently related to UTI. The patient has upcoming appointments with the vascular specialist for evaluation of possible PVD. The patients daughter states he is doing okay just issues with UTI.  He has a planned visit with the NP from [place] today. Will continue to monitor. 10-09-2020: The patients daughter states the patient is eating well  and denies any concerns with HLD health and well being. Will continue to monitor. Advised patient to call the office with changes or new questions  Provided education to patient re: heart healthy diet. 10-09-2020: The patient is eating well and the patients daughter tries to make sure he watches sodium and fats. Discussed plans with patient for ongoing care management follow up and provided patient with direct contact information for care management team   Patient Goals/Self-Care Activities: patient will:   - call for medicine refill 2 or 3 days before it runs out - call if I am sick and can't take my medicine - keep a list of all the medicines I take; vitamins and herbals too - learn to read medicine labels - use a pillbox to sort medicine - use an alarm clock or phone to remind me to take my medicine - drink 6 to 8 glasses of water each day - eat 3 to 5 servings of fruits and vegetables each day - eat 5 or 6 small meals each day - limit fast food meals to no more than 1 per week - manage portion size - prepare main meal at home 3 to 5  days each week - read food labels for fat, fiber, carbohydrates and portion size - be open to making changes - I can manage, know and watch for signs of a heart attack - if I have chest pain, call for help - learn about small changes that will make a big difference - learn my personal risk factors - education plan reviewed and/or amended - empathy and reassurance conveyed - family/caregiver participation in learning encouraged - health literacy screen reviewed - patient's preferred learning methods utilized - privacy ensured - questions encouraged - readiness to learn monitored   Follow Up Plan: Telephone follow up appointment with care management team member scheduled for: 11-13-2020 at 0945 am      Care Plan : RNCM: Urinary Incontinence (Adult) and frequent UTI  Updates made by Vanita Ingles, RN since 10/09/2020 12:00 AM     Problem: RNCM: Symptom Management (Urinary Incontinence) and UTI   Priority: High     Long-Range Goal: RNCM: Urinary Incontinence Symptoms Manged   Start Date: 09/04/2020  Expected End Date: 09/04/2021  This Visit's Progress: On track  Recent Progress: On track  Priority: High  Note:   Current Barriers:  Ineffective Self Health Maintenance in a patient with  Urinary incontinence and frequent UTI Unable to independently manage UTI and urinary incontinence  Does not contact provider office for questions/concerns Clinical Goal(s):  Collaboration with Parks Ranger Devonne Doughty, DO regarding development and update of comprehensive plan of care as evidenced by provider attestation and co-signature Inter-disciplinary care team collaboration (see longitudinal plan of care) patient will work with care management team to address care coordination and chronic disease management needs related to Disease Management Educational Needs Care Coordination Other frequent UTI    Interventions:  Evaluation of current treatment plan related to  urinary incontinence and  frequent UTI ,  self-management and patient's adherence to plan as established by provider. 09-04-2020: The patient has seen the pcp and specialist recently for frequent UTI and urinary incontinence. The patients daughter states the patient thinks the antibiotics are not working but after talking with the patient and her mother the patients daughter things the burning may be coming from where he has irritation due to wearing depends because of his incontinence.  The palliative NP is going out to the home today  and a secure message has been sent to ask her to evaluate the patient and given recommendations. Will also collaborate with pcp. The patients daughter instructed to call the office for worsening sx and sx or changes. Will continue to monitor. 10-09-2020: The patients daughter states the patient is still having urinary concerns and it has been going on for 3 months. The patient is going tomorrow to see urology and will have the scope run. The patients daughter is hopeful there will be answers tomorrow. He has had clear UA's and she states that she is tired of them giving him new antibiotics because it is not fixing the issue. The patient had some of his prostate removed a couple of years ago. The patient is complaining of sharpe pain at times and burning. The patient sometimes can not fully explain his symptoms and gets frustrated. Empathetic listening and support given. Will continue to monitor.  Collaboration with Randy Hauser, DO regarding development and update of comprehensive plan of care as evidenced by provider attestation       and co-signature Inter-disciplinary care team collaboration (see longitudinal plan of care) Discussed plans with patient for ongoing care management follow up and provided patient with direct contact information for care management team Self Care Activities:  Patient verbalizes understanding of plan to effectively manage frequent UTI's and urinary incontinence   Self administers medications as prescribed Attends all scheduled provider appointments Calls pharmacy for medication refills Attends church or other social activities Performs ADL's independently Calls provider office for new concerns or questions Patient Goals: bladder-training plan developed - counseling by pharmacist provided - healthy lifestyle promoted - incontinence product use encouraged - medication side effects managed - medication-adherence assessment completed - participation in physical therapy encouraged - practice of bladder-training techniques encouraged - practice of pelvic floor exercises encouraged - response to pharmacologic therapy monitored - strategies to manage constipation promoted - strategies to manage symptom triggers promoted - symptom triggers identified    Follow Up Plan: Telephone follow up appointment with care management team member scheduled for: 11-13-2020 at 0945 am     Plan:Telephone follow up appointment with care management team member scheduled for:  11-13-2020 at [place] am  Noreene Larsson RN, MSN, [place] Mobile: [phone]

## 2020-10-09 NOTE — Patient Instructions (Signed)
Visit Information  PATIENT GOALS:  Goals Addressed             This Visit's Progress    RNCM: Track Symptoms-Urinary Incontinence       Timeframe:  Long-Range Goal Priority:  High Start Date:       09-04-2020                      Expected End Date:     09-04-2021                  Follow Up Date 11-13-2020    - track number of times I do not get to the bathroom in time - track when, how much I go    Why is this important?   There are many reasons you may leak urine (pee). Keeping a diary of symptoms can help you figure out why. This will help you and your doctor come up with a treatment plan.    Notes: 09-04-2020: The patients daughter states that the patient can't get to the bathroom quick enough so he is wearing depends. She is wondering if this is why the patient is complaining of discomfort when he urinates. He has been on 3 different antibiotics. Will reach out to the palliative NP that has a home visit today and see if she could check the place out today. Will collaborate with pcp and NP about information provided by the daughter. 10-09-2020: The patient is still complaining of UTI symptoms but his daughter does not feel like it is UTI related. The patient has had clear UA's. He will be having a procedure tomorrow where they run the scope to see if there is issues with his prostate. He tells his daughter and wife it is pain and burning. Education and support given.         Patient verbalizes understanding of instructions provided today and agrees to view in [place].   Telephone follow up appointment with care management team member scheduled for:11-13-2020 at [place] am  [place], MSN, [place] Mobile: [phone]

## 2020-10-10 ENCOUNTER — Other Ambulatory Visit
Admission: RE | Admit: 2020-10-10 | Discharge: 2020-10-10 | Disposition: A | Discharge status: Home / Self Care | Payer: Medicare Other | Attending: Urology | Admitting: Urology

## 2020-10-10 ENCOUNTER — Ambulatory Visit: Payer: Medicare Other | Admitting: Urology

## 2020-10-10 ENCOUNTER — Encounter: Payer: Self-pay | Admitting: Urology

## 2020-10-10 ENCOUNTER — Other Ambulatory Visit: Payer: Self-pay

## 2020-10-10 ENCOUNTER — Other Ambulatory Visit: Payer: Self-pay | Admitting: Urology

## 2020-10-10 VITALS — BP 134/84 | HR 78 | Ht 72.0 in | Wt 218.0 lb

## 2020-10-10 DIAGNOSIS — N390 Urinary tract infection, site not specified: Secondary | ICD-10-CM

## 2020-10-10 DIAGNOSIS — N138 Other obstructive and reflux uropathy: Secondary | ICD-10-CM | POA: Insufficient documentation

## 2020-10-10 DIAGNOSIS — N401 Enlarged prostate with lower urinary tract symptoms: Secondary | ICD-10-CM | POA: Diagnosis present

## 2020-10-10 DIAGNOSIS — N210 Calculus in bladder: Secondary | ICD-10-CM

## 2020-10-10 LAB — URINALYSIS, COMPLETE (UACMP) WITH MICROSCOPIC
Bilirubin Urine: NEGATIVE
Glucose, UA: NEGATIVE mg/dL
Hgb urine dipstick: NEGATIVE
Ketones, ur: NEGATIVE mg/dL
Nitrite: NEGATIVE
Protein, ur: NEGATIVE mg/dL
Specific Gravity, Urine: 1.02 (ref 1.005–1.030)
pH: 6 (ref 5.0–8.0)

## 2020-10-10 MED ORDER — LIDOCAINE HCL URETHRAL/MUCOSAL 2 % EX GEL
1.0000 "application " | Freq: Once | CUTANEOUS | Status: AC
  Administered 2020-10-10: 1 via URETHRAL

## 2020-10-10 NOTE — Progress Notes (Signed)
Surgical Physician Order Form  ** Scheduling expectation : ASAP  *Length of Case: 2.5 hours  *Clearance needed: no  *Anticoagulation Instructions: Hold all anticoagulants  *Aspirin Instructions: Hold Aspirin  *Post-op visit Date/Instructions:  1-3 day voiding trial, 10 weeks PVR with MD  *Diagnosis:  Bladder stone, BPH with obstruction  *Procedure: Cystolitholapaxy and HOLEP (31438)  -Admit type: OUTpatient  -Anesthesia: General  -VTE Prophylaxis Standing Order SCD's       Other:   -Standing Lab Orders Per Anesthesia    Lab other:  CBC (urine culture sent 10/4)  -Standing Test orders EKG/Chest x-ray per Anesthesia       Test other:   - Medications:     Ampicillin 1gm IV and Cipro 400 mg IV   Other Instructions:

## 2020-10-10 NOTE — Progress Notes (Signed)
Cystoscopy Procedure Note:  Indication: Intermittent dysuria and recurrent UTIs  History of Foley dependent urinary retention with 70 g prostate who underwent a HOLEP in October 2020.  Median lobe and right lateral lobe were removed, but secondary to poor vision from bleeding the left lateral lobe was left in situ  After informed consent and discussion of the procedure and its risks, KOLTEN RYBACK was positioned and prepped in the standard fashion. Cystoscopy was performed with a flexible cystoscope. The urethra, bladder neck and entire bladder was visualized in a standard fashion. The prostate was notable for obstructive appearing tissue from the left lateral lobe.  Moderate to severe bladder trabeculations, on retroflexion there was a 2 cm black bladder stone at the base of the bladder.   Findings: ~2 cm bladder stone, residual obstructive appearing tissue from left lateral lobe  --------------------------------------------------------------------------------  Assessment and Plan: Comorbid 86 year old male with history of Foley dependent urinary retention and 70 g prostate who underwent a HOLEP in October 2020 with removal of 35 g of tissue, left lateral lobe was left in situ secondary to poor vision from bleeding.  There was some intermediate risk prostate cancer on pathology, however PSA has remained low at 0.6 in surgery.  2cm bladder stone on cystoscopy today explains his recurrent UTIs and intermittent dysuria, and the left lateral lobe of the prostate appears obstructive.  We discussed options including observation, cystolitholapaxy, or cystolitholapaxy and HOLEP of left lateral lobe.  Risks and benefits discussed extensively, and he elects to proceed with cystolitholapaxy and HOLEP of the remaining left lateral lobe. We discussed the risks and benefits of HoLEP at length.  The procedure requires general anesthesia and takes 1 to 2 hours, and a holmium laser is used to enucleate the  prostate and push this tissue into the bladder.  A morcellator is then used to remove this tissue, which is sent for pathology.  The vast majority(>95%) of patients are able to discharge the same day with a catheter in place for 2 to 3 days, and will follow-up in clinic for a voiding trial.  We specifically discussed the risks of bleeding, infection, retrograde ejaculation, temporary urgency and urge incontinence, very low risk of long-term incontinence, urethral stricture/bladder neck contracture, pathologic evaluation of prostate tissue and possible detection of prostate cancer or other malignancy, and possible need for additional procedures.  Schedule cystolitholapaxy and HOLEP Urine sent for culture today  [name], MD 10/10/2020

## 2020-10-11 ENCOUNTER — Telehealth: Payer: Self-pay | Admitting: Urology

## 2020-10-11 LAB — URINE CULTURE: Culture: NO GROWTH

## 2020-10-11 NOTE — Telephone Encounter (Signed)
Surgical orders entered and faxed to pre-admit testing.

## 2020-10-11 NOTE — Progress Notes (Signed)
[facility] Posting Form   Surgery Date/Time: Date: 10/20/2020  Surgeon: Dr. [name], MD  Surgery Location: Day Surgery  Inpt ( No  )   Outpt (Yes)   Obs ( No  )   Diagnosis: N20.1 Bladder Stone; N40.1, N13.8 BPH with Obstruction  -CPT: 52649  Surgery: HOLEP  -CPT: 13086  Surgery: Cystolitholapaxy   Stop Anticoagulations: Yes; Hold Aspirin as well  Cardiac/Medical/Pulmonary Clearance needed: No   *Orders entered into EPIC  Date: 10/11/20   *Case booked in [place]  Date: 10/11/20  *Notified pt of Surgery: Date: 10/11/20  PRE-OP UA & CX: Yes & CBC  *Placed into Prior Authorization Work [place] Date: 10/11/20   Assistant/laser/rep:No

## 2020-10-11 NOTE — Telephone Encounter (Addendum)
Per Dr. Diamantina Providence Patient is to be scheduled for Holmium Laser Enucleation of Prostate (Holep) and a Cystolitholapaxy.  Randy [name] daughter Randy [name] was contacted and possible surgical dates were discussed, 10/20/20 was agreed upon for surgery.  Patient was directed to call [phone] between 1-3pm the day before surgery to find out surgical arrival time.  Instructions were given not to eat or drink from midnight on the night before surgery and have a driver for the day of surgery. On the surgery day patient was instructed to enter through the [place] entrance of [facility] report the Same Day Surgery desk.   Pre-Admit Testing will be in contact via phone to set up an interview with the anesthesia team to review your history and medications prior to surgery.   Reminder of this information was sent via mychart to the patient.   Patient is to hold anticoagulants and ASA per Dr. Diamantina Providence.  Currently taking ASA 81 mg.

## 2020-10-12 ENCOUNTER — Other Ambulatory Visit: Payer: Medicare Other | Admitting: Urology

## 2020-10-16 ENCOUNTER — Other Ambulatory Visit
Admission: RE | Admit: 2020-10-16 | Discharge: 2020-10-16 | Disposition: A | Discharge status: Home / Self Care | Payer: Medicare Other | Source: Ambulatory Visit | Attending: Urology | Admitting: Urology

## 2020-10-16 ENCOUNTER — Other Ambulatory Visit: Payer: Self-pay

## 2020-10-16 NOTE — Patient Instructions (Signed)
Your procedure is scheduled on: Friday October 20, 2020. Report to Day Surgery inside [facility] 2nd floor. To find out your arrival time please call [phone] between 1PM - 3PM on Thursday October 19, 2020.  Remember: Instructions that are not followed completely may result in serious medical risk,  up to and including death, or upon the discretion of your surgeon and anesthesiologist your  surgery may need to be rescheduled.     _X__ 1. Do not eat food or drink fluids after midnight the night before your procedure.                 No chewing gum or hard candies.   __X__2.  On the morning of surgery brush your teeth with toothpaste and water, you                may rinse your mouth with mouthwash if you wish.  Do not swallow any toothpaste or mouthwash.     _X__ 3.  No Alcohol for 24 hours before or after surgery.   _X__ 4.  Do Not Smoke or use e-cigarettes For 24 Hours Prior to Your Surgery.                 Do not use any chewable tobacco products for at least 6 hours prior to                 Surgery.  _X__  5.  Do not use any recreational drugs (marijuana, cocaine, heroin, ecstasy, MDMA or other)                For at least one week prior to your surgery.  Combination of these drugs with anesthesia                May have life threatening results.  __X__6.  Notify your doctor if there is any change in your medical condition      (cold, fever, infections).     Do not wear jewelry, make-up, hairpins, clips or nail polish. Do not wear lotions, powders, or perfumes. You may wear deodorant. Do not shave 48 hours prior to surgery. Men may shave face and neck. Do not bring valuables to the hospital.    [facility] is not responsible for any belongings or valuables.  Contacts, dentures or bridgework may not be worn into surgery. Leave your suitcase in the car. After surgery it may be brought to your room. For patients admitted to the hospital, discharge  time is determined by your treatment team.   Patients discharged the day of surgery will not be allowed to drive home.   Make arrangements for someone to be with you for the first 24 hours of your Same Day Discharge.   __X__ Take these medicines the morning of surgery with A SIP OF WATER:    1. carbidopa-levodopa (SINEMET CR) 50-200 MG   2. nitrofurantoin (MACRODANTIN) 50 MG  3. omeprazole (PRILOSEC) 20 MG   4. rivastigmine (EXELON) 1.5 MG   5.  6.  ____ Fleet Enema (as directed)   __X__ Use Antibacterial Soap as directed (shower the night before your procedure and morning of prcedure)  ____ Use Benzoyl Peroxide Gel as instructed  ____ Use inhalers on the day of surgery  ____ Stop metformin 2 days prior to surgery    ____ Take 1/2 of usual insulin dose the night before surgery. No insulin the morning          of  surgery.   ____ Call your PCP, cardiologist, or Pulmonologist if taking Coumadin/Plavix/aspirin and ask when to stop before your surgery.   __X__ One Week prior to surgery- Stop Anti-inflammatories such as Ibuprofen, Aleve, Advil, Motrin, meloxicam (MOBIC), diclofenac, etodolac, ketorolac, Toradol, Daypro, piroxicam, Goody's or BC powders. OK TO USE TYLENOL IF NEEDED   __X__ Stop supplements until after surgery. vitamin C (ASCORBIC ACID),  Omega-3 Fatty Acids (FISH OIL) , CRANBERRY PO, Coenzyme Q10 (CO Q 10), zinc sulfate 220 (50 Zn) MG   ____ Bring C-Pap to the hospital.    If you have any questions regarding your pre-procedure instructions,  Please call Pre-admit Testing at [phone].

## 2020-10-17 ENCOUNTER — Other Ambulatory Visit
Admission: RE | Admit: 2020-10-17 | Discharge: 2020-10-17 | Disposition: A | Discharge status: Home / Self Care | Payer: Medicare Other | Source: Ambulatory Visit | Attending: Urology | Admitting: Urology

## 2020-10-17 DIAGNOSIS — N138 Other obstructive and reflux uropathy: Secondary | ICD-10-CM | POA: Insufficient documentation

## 2020-10-17 DIAGNOSIS — N210 Calculus in bladder: Secondary | ICD-10-CM | POA: Diagnosis present

## 2020-10-17 DIAGNOSIS — N401 Enlarged prostate with lower urinary tract symptoms: Secondary | ICD-10-CM | POA: Insufficient documentation

## 2020-10-17 LAB — BASIC METABOLIC PANEL
Anion gap: 7 (ref 5–15)
BUN: 21 mg/dL (ref 8–23)
CO2: 30 mmol/L (ref 22–32)
Calcium: 8.5 mg/dL — ABNORMAL LOW (ref 8.9–10.3)
Chloride: 104 mmol/L (ref 98–111)
Creatinine, Ser: 0.9 mg/dL (ref 0.61–1.24)
GFR, Estimated: >60 mL/min (ref >60)
Glucose, Bld: 77 mg/dL (ref 70–99)
Potassium: 3.8 mmol/L (ref 3.5–5.1)
Sodium: 141 mmol/L (ref 135–145)

## 2020-10-17 LAB — CBC
HCT: 35.1 % — ABNORMAL LOW (ref 39.0–52.0)
Hemoglobin: 12.3 g/dL — ABNORMAL LOW (ref 13.0–17.0)
MCH: 32.6 pg (ref 26.0–34.0)
MCHC: 35 g/dL (ref 30.0–36.0)
MCV: 93.1 fL (ref 80.0–100.0)
Platelets: 182 10*3/uL (ref 150–400)
RBC: 3.77 MIL/uL — ABNORMAL LOW (ref 4.22–5.81)
RDW: 13.5 % (ref 11.5–15.5)
WBC: 5.3 10*3/uL (ref 4.0–10.5)
nRBC: 0 % (ref 0.0–0.2)

## 2020-10-20 ENCOUNTER — Other Ambulatory Visit: Payer: Self-pay

## 2020-10-20 ENCOUNTER — Ambulatory Visit: Payer: Medicare Other | Admitting: Urgent Care

## 2020-10-20 ENCOUNTER — Ambulatory Visit
Admission: RE | Admit: 2020-10-20 | Discharge: 2020-10-20 | Disposition: A | Discharge status: Home / Self Care | Payer: Medicare Other | Source: Ambulatory Visit | Attending: Urology | Admitting: Urology

## 2020-10-20 ENCOUNTER — Encounter: Payer: Self-pay | Admitting: Urology

## 2020-10-20 ENCOUNTER — Encounter
Admission: RE | Disposition: A | Discharge status: Home / Self Care | Payer: Self-pay | Source: Ambulatory Visit | Attending: Urology

## 2020-10-20 ENCOUNTER — Ambulatory Visit: Payer: Medicare Other

## 2020-10-20 DIAGNOSIS — E669 Obesity, unspecified: Secondary | ICD-10-CM | POA: Diagnosis not present

## 2020-10-20 DIAGNOSIS — N3289 Other specified disorders of bladder: Secondary | ICD-10-CM

## 2020-10-20 DIAGNOSIS — F0280 Dementia in other diseases classified elsewhere without behavioral disturbance: Secondary | ICD-10-CM | POA: Diagnosis not present

## 2020-10-20 DIAGNOSIS — K219 Gastro-esophageal reflux disease without esophagitis: Secondary | ICD-10-CM | POA: Insufficient documentation

## 2020-10-20 DIAGNOSIS — N210 Calculus in bladder: Secondary | ICD-10-CM

## 2020-10-20 DIAGNOSIS — G3183 Dementia with Lewy bodies: Secondary | ICD-10-CM | POA: Diagnosis not present

## 2020-10-20 DIAGNOSIS — N401 Enlarged prostate with lower urinary tract symptoms: Secondary | ICD-10-CM

## 2020-10-20 DIAGNOSIS — Z87891 Personal history of nicotine dependence: Secondary | ICD-10-CM | POA: Diagnosis not present

## 2020-10-20 DIAGNOSIS — I10 Essential (primary) hypertension: Secondary | ICD-10-CM | POA: Diagnosis not present

## 2020-10-20 DIAGNOSIS — Z87440 Personal history of urinary (tract) infections: Secondary | ICD-10-CM | POA: Diagnosis not present

## 2020-10-20 DIAGNOSIS — E785 Hyperlipidemia, unspecified: Secondary | ICD-10-CM | POA: Diagnosis not present

## 2020-10-20 DIAGNOSIS — N138 Other obstructive and reflux uropathy: Secondary | ICD-10-CM

## 2020-10-20 DIAGNOSIS — N390 Urinary tract infection, site not specified: Secondary | ICD-10-CM

## 2020-10-20 HISTORY — PX: CYSTOSCOPY WITH LITHOLAPAXY: SHX1425

## 2020-10-20 HISTORY — PX: HOLEP-LASER ENUCLEATION OF THE PROSTATE WITH MORCELLATION: SHX6641

## 2020-10-20 SURGERY — ENUCLEATION, PROSTATE, USING LASER, WITH MORCELLATION
Anesthesia: General

## 2020-10-20 MED ORDER — CHLORHEXIDINE GLUCONATE 0.12 % MT SOLN: 15.0000 mL | Freq: Once | OROMUCOSAL | Status: AC

## 2020-10-20 MED ORDER — SUGAMMADEX SODIUM 200 MG/2ML IV SOLN
INTRAVENOUS | Status: DC | PRN
  Administered 2020-10-20: 200 mg via INTRAVENOUS

## 2020-10-20 MED ORDER — FENTANYL CITRATE (PF) 100 MCG/2ML IJ SOLN
INTRAMUSCULAR | Status: AC
  Filled 2020-10-20: qty 2

## 2020-10-20 MED ORDER — ORAL CARE MOUTH RINSE: 15.0000 mL | Freq: Once | OROMUCOSAL | Status: AC

## 2020-10-20 MED ORDER — CIPROFLOXACIN IN D5W 400 MG/200ML IV SOLN
INTRAVENOUS | Status: AC
  Administered 2020-10-20: 400 mg via INTRAVENOUS
  Filled 2020-10-20: qty 200

## 2020-10-20 MED ORDER — SODIUM CHLORIDE 0.9 % IV SOLN
1.0000 g | INTRAVENOUS | Status: AC
  Administered 2020-10-20: 1 g via INTRAVENOUS
  Filled 2020-10-20: qty 1

## 2020-10-20 MED ORDER — ACETAMINOPHEN 10 MG/ML IV SOLN
INTRAVENOUS | Status: AC
  Filled 2020-10-20: qty 100

## 2020-10-20 MED ORDER — LIDOCAINE HCL (CARDIAC) PF 100 MG/5ML IV SOSY
PREFILLED_SYRINGE | INTRAVENOUS | Status: DC | PRN
  Administered 2020-10-20: 80 mg via INTRAVENOUS

## 2020-10-20 MED ORDER — CIPROFLOXACIN IN D5W 400 MG/200ML IV SOLN: 400.0000 mg | INTRAVENOUS | Status: AC

## 2020-10-20 MED ORDER — FENTANYL CITRATE (PF) 100 MCG/2ML IJ SOLN: 25.0000 ug | INTRAMUSCULAR | Status: DC | PRN

## 2020-10-20 MED ORDER — FENTANYL CITRATE (PF) 100 MCG/2ML IJ SOLN
INTRAMUSCULAR | Status: DC | PRN
  Administered 2020-10-20: 50 ug via INTRAVENOUS

## 2020-10-20 MED ORDER — ONDANSETRON HCL 4 MG/2ML IJ SOLN
INTRAMUSCULAR | Status: DC | PRN
  Administered 2020-10-20: 4 mg via INTRAVENOUS

## 2020-10-20 MED ORDER — ROCURONIUM BROMIDE 100 MG/10ML IV SOLN
INTRAVENOUS | Status: DC | PRN
  Administered 2020-10-20: 50 mg via INTRAVENOUS
  Administered 2020-10-20: 10 mg via INTRAVENOUS

## 2020-10-20 MED ORDER — BELLADONNA ALKALOIDS-OPIUM 16.2-60 MG RE SUPP
RECTAL | Status: AC
  Filled 2020-10-20: qty 1

## 2020-10-20 MED ORDER — PROPOFOL 10 MG/ML IV BOLUS
INTRAVENOUS | Status: AC
  Filled 2020-10-20: qty 80

## 2020-10-20 MED ORDER — BELLADONNA ALKALOIDS-OPIUM 16.2-60 MG RE SUPP
RECTAL | Status: DC | PRN
  Administered 2020-10-20: 1 via RECTAL

## 2020-10-20 MED ORDER — CHLORHEXIDINE GLUCONATE 0.12 % MT SOLN
OROMUCOSAL | Status: AC
  Administered 2020-10-20: 15 mL via OROMUCOSAL
  Filled 2020-10-20: qty 15

## 2020-10-20 MED ORDER — HYDROCODONE-ACETAMINOPHEN 5-325 MG PO TABS: 1.0000 | ORAL_TABLET | ORAL | 0 refills | Status: AC | PRN

## 2020-10-20 MED ORDER — SODIUM CHLORIDE 0.9 % IV SOLN: Freq: Once | INTRAVENOUS | Status: AC

## 2020-10-20 MED ORDER — PROPOFOL 10 MG/ML IV BOLUS
INTRAVENOUS | Status: DC | PRN
  Administered 2020-10-20: 140 mg via INTRAVENOUS
  Administered 2020-10-20: 40 mg via INTRAVENOUS

## 2020-10-20 MED ORDER — PHENYLEPHRINE HCL (PRESSORS) 10 MG/ML IV SOLN
INTRAVENOUS | Status: AC
  Filled 2020-10-20: qty 1

## 2020-10-20 MED ORDER — ONDANSETRON HCL 4 MG/2ML IJ SOLN: 4.0000 mg | Freq: Once | INTRAMUSCULAR | Status: DC | PRN

## 2020-10-20 MED ORDER — ACETAMINOPHEN 10 MG/ML IV SOLN
INTRAVENOUS | Status: DC | PRN
  Administered 2020-10-20: 1000 mg via INTRAVENOUS

## 2020-10-20 MED ORDER — DEXAMETHASONE SODIUM PHOSPHATE 10 MG/ML IJ SOLN
INTRAMUSCULAR | Status: DC | PRN
  Administered 2020-10-20: 4 mg via INTRAVENOUS

## 2020-10-20 MED ORDER — SODIUM CHLORIDE 0.9 % IR SOLN
Status: DC | PRN
  Administered 2020-10-20: 30000 mL

## 2020-10-20 MED ORDER — LACTATED RINGERS IV SOLN: INTRAVENOUS | Status: DC

## 2020-10-20 SURGICAL SUPPLY — 43 items
ADAPTER IRRIG TUBE 2 SPIKE SOL (ADAPTER) ×4 IMPLANT
ADPR TBG 2 SPK PMP STRL ASCP (ADAPTER) ×2
BAG DRAIN CYSTO-URO LG1000N (MISCELLANEOUS) ×2 IMPLANT
BAG DRN LRG CPC RND TRDRP CNTR (MISCELLANEOUS) ×1
BAG URO DRAIN 4000ML (MISCELLANEOUS) ×2 IMPLANT
CATH FOLEY 3WAY 30CC 24FR (CATHETERS) ×2
CATH URETL OPEN 5X70 (CATHETERS) ×2 IMPLANT
CATH URTH STD 24FR FL 3W 2 (CATHETERS) ×1 IMPLANT
CNTNR SPEC 2.5X3XGRAD LEK (MISCELLANEOUS)
CONT SPEC 4OZ STER OR WHT (MISCELLANEOUS)
CONT SPEC 4OZ STRL OR WHT (MISCELLANEOUS)
CONTAINER COLLECT MORCELLATR (MISCELLANEOUS) ×1 IMPLANT
CONTAINER SPEC 2.5X3XGRAD LEK (MISCELLANEOUS) IMPLANT
DRAPE UTILITY 15X26 TOWEL STRL (DRAPES) ×2 IMPLANT
ELECT BIVAP BIPO 22/24 DONUT (ELECTROSURGICAL)
ELECTRD BIVAP BIPO 22/24 DONUT (ELECTROSURGICAL) IMPLANT
FIBER LASER FLEXIVA PULSE 550 (Laser) ×2 IMPLANT
FIBER LASER MOSES 550 DFL (Laser) ×2 IMPLANT
FILTER OVERFLOW MORCELLATOR (FILTER) ×1 IMPLANT
GLOVE SURG UNDER POLY LF SZ7.5 (GLOVE) ×6 IMPLANT
GOWN STRL REUS W/ TWL LRG LVL3 (GOWN DISPOSABLE) ×2 IMPLANT
GOWN STRL REUS W/ TWL XL LVL3 (GOWN DISPOSABLE) ×1 IMPLANT
GOWN STRL REUS W/TWL LRG LVL3 (GOWN DISPOSABLE) ×4
GOWN STRL REUS W/TWL XL LVL3 (GOWN DISPOSABLE) ×2
HOLDER FOLEY CATH W/STRAP (MISCELLANEOUS) ×2 IMPLANT
IV NS IRRIG 3000ML ARTHROMATIC (IV SOLUTION) ×22 IMPLANT
KIT PROBE TRILOGY 3.9X350 (MISCELLANEOUS) IMPLANT
KIT TURNOVER CYSTO (KITS) ×2 IMPLANT
MBRN O SEALING YLW 17 FOR INST (MISCELLANEOUS) ×2
MEMBRANE SLNG YLW 17 FOR INST (MISCELLANEOUS) ×1 IMPLANT
MORCELLATOR COLLECT CONTAINER (MISCELLANEOUS) ×2
MORCELLATOR OVERFLOW FILTER (FILTER) ×2
MORCELLATOR ROTATION 4.75 335 (MISCELLANEOUS) ×2 IMPLANT
PACK CYSTO AR (MISCELLANEOUS) ×2 IMPLANT
SET CYSTO W/LG BORE CLAMP LF (SET/KITS/TRAYS/PACK) ×2 IMPLANT
SET IRRIG Y TYPE TUR BLADDER L (SET/KITS/TRAYS/PACK) ×2 IMPLANT
SLEEVE PROTECTION STRL DISP (MISCELLANEOUS) ×4 IMPLANT
SURGILUBE 2OZ TUBE FLIPTOP (MISCELLANEOUS) ×2 IMPLANT
SYR TOOMEY IRRIG 70ML (MISCELLANEOUS) ×2
SYRINGE TOOMEY IRRIG 70ML (MISCELLANEOUS) ×1 IMPLANT
TUBE PUMP MORCELLATOR PIRANHA (TUBING) ×2 IMPLANT
WATER STERILE IRR 1000ML POUR (IV SOLUTION) ×2 IMPLANT
WATER STERILE IRR 500ML POUR (IV SOLUTION) ×2 IMPLANT

## 2020-10-20 NOTE — Anesthesia Preprocedure Evaluation (Signed)
Anesthesia Evaluation  Patient identified by MRN, date of birth, ID band Patient awake    Reviewed: Allergy & Precautions, NPO status , Patient's Chart, lab work & pertinent test results  History of Anesthesia Complications Negative for: history of anesthetic complications  Airway Mallampati: II  TM Distance: >3 FB Neck ROM: Full    Dental no notable dental hx.    Pulmonary neg sleep apnea, neg COPD, former smoker,    breath sounds clear to auscultation- rhonchi (-) wheezing      Cardiovascular hypertension, Pt. on medications (-) CAD, (-) Past MI, (-) Cardiac Stents and (-) CABG  Rhythm:Regular Rate:Normal - Systolic murmurs and - Diastolic murmurs    Neuro/Psych neg Seizures PSYCHIATRIC DISORDERS Dementia negative neurological ROS     GI/Hepatic Neg liver ROS, GERD  ,  Endo/Other  negative endocrine ROSneg diabetes  Renal/GU negative Renal ROS     Musculoskeletal  (+) Arthritis ,   Abdominal (+) - obese,   Peds  Hematology negative hematology ROS (+)   Anesthesia Other Findings Past Medical History: No date: Arthritis No date: GERD (gastroesophageal reflux disease) No date: Hyperlipidemia No date: Hypertension No date: Lewy body dementia (HCC) No date: Osteoporosis No date: Prostate enlargement   Reproductive/Obstetrics                             Anesthesia Physical Anesthesia Plan  ASA: 3  Anesthesia Plan: General   Post-op Pain Management:    Induction: Intravenous  PONV Risk Score and Plan: 1 and Ondansetron and Dexamethasone  Airway Management Planned: Oral ETT  Additional Equipment:   Intra-op Plan:   Post-operative Plan: Extubation in OR  Informed Consent: I have reviewed the patients History and Physical, chart, labs and discussed the procedure including the risks, benefits and alternatives for the proposed anesthesia with the patient or authorized  representative who has indicated his/her understanding and acceptance.     Dental advisory given  Plan Discussed with: CRNA and Anesthesiologist  Anesthesia Plan Comments:         Anesthesia Quick Evaluation

## 2020-10-20 NOTE — Anesthesia Procedure Notes (Signed)
Procedure Name: Intubation Date/Time: 10/20/2020 7:39 AM Performed by: Lowry Bowl, CRNA Pre-anesthesia Checklist: Patient identified, Emergency Drugs available, Suction available and Patient being monitored Patient Re-evaluated:Patient Re-evaluated prior to induction Oxygen Delivery Method: Circle system utilized Preoxygenation: Pre-oxygenation with 100% oxygen Induction Type: IV induction Ventilation: Mask ventilation without difficulty Laryngoscope Size: McGraph and 4 Grade View: Grade I Tube type: Oral Tube size: 7.5 mm Number of attempts: 1 Airway Equipment and Method: Stylet and Video-laryngoscopy Placement Confirmation: ETT inserted through vocal cords under direct vision, positive ETCO2 and breath sounds checked- equal and bilateral Secured at: 22 cm Tube secured with: Tape Dental Injury: Teeth and Oropharynx as per pre-operative assessment

## 2020-10-20 NOTE — Anesthesia Postprocedure Evaluation (Signed)
Anesthesia Post Note  Patient: Randy [name]  Procedure(s) Performed: HOLEP-LASER ENUCLEATION OF THE PROSTATE WITH MORCELLATION CYSTOSCOPY WITH LITHOLAPAXY  Patient location during evaluation: PACU Anesthesia Type: General Level of consciousness: awake and alert and oriented Pain management: pain level controlled Vital Signs Assessment: post-procedure vital signs reviewed and stable Respiratory status: spontaneous breathing, nonlabored ventilation and respiratory function stable Cardiovascular status: blood pressure returned to baseline and stable Postop Assessment: no signs of nausea or vomiting Anesthetic complications: no   No notable events documented.   Last Vitals:  Vitals:   10/20/20 1000 10/20/20 1015  BP: (!) 166/84 (!) 166/81  Pulse: (!) 52 (!) 52  Resp: (!) 21 15  Temp: (!) 36.3 C   SpO2: 95% 97%    Last Pain:  Vitals:   10/20/20 1015  TempSrc:   PainSc: 0-No pain                 Sativa Gelles

## 2020-10-20 NOTE — Transfer of Care (Signed)
Immediate Anesthesia Transfer of Care Note  Patient: Randy [name]  Procedure(s) Performed: HOLEP-LASER ENUCLEATION OF THE PROSTATE WITH MORCELLATION CYSTOSCOPY WITH LITHOLAPAXY  Patient Location: PACU  Anesthesia Type:General  Level of Consciousness: awake, drowsy and patient cooperative  Airway & Oxygen Therapy: Patient Spontanous Breathing and Patient connected to face mask oxygen  Post-op Assessment: Report given to RN, Post -op Vital signs reviewed and stable and Patient moving all extremities  Post vital signs: Reviewed and stable  Last Vitals:  Vitals Value Taken Time  BP 164/94 10/20/20 0905  Temp 36.2 C 10/20/20 0905  Pulse 54 10/20/20 0909  Resp 16 10/20/20 0909  SpO2 100 % 10/20/20 0909  Vitals shown include unvalidated device data.  Last Pain:  Vitals:   10/20/20 0639  TempSrc: Temporal  PainSc: 0-No pain      Patients Stated Pain Goal: 0 (23/34/35 6861)  Complications: No notable events documented.

## 2020-10-20 NOTE — H&P (Signed)
10/20/20 7:03 AM   Randy [name] [birth year] [id number]  CC: Bladder stone, BPH  HPI: 86 year old male with dementia and history of Foley dependent urinary retention with 70 g prostate who underwent a HOLEP in October 2020.  Median lobe and right lateral lobe were removed, but secondary to poor vision from bleeding the left lateral lobe was left in situ.  He was able to void spontaneously after this procedure.  Over the last 6 months he has developed dysuria and some urinary frequency.  Cystoscopy shows a 2 cm bladder stone and residual obstructive appearing left lateral lobe.     PMH: Past Medical History:  Diagnosis Date   Arthritis    GERD (gastroesophageal reflux disease)    Hyperlipidemia    Hypertension    Lewy body dementia ([place])    Osteoporosis    Prostate enlargement     Surgical History: Past Surgical History:  Procedure Laterality Date   CHOLECYSTECTOMY N/A 09/10/2018   Procedure: LAPAROSCOPIC CHOLECYSTECTOMY;  Surgeon: Benjamine Sprague, DO;  Location: [facility] ORS;  Service: General;  Laterality: N/A;   GANGLION CYST EXCISION Left 2013   Left upper arm   HERNIA REPAIR N/A 2008   M[place]line, ventral acquired hernia repair   HOLEP-LASER ENUCLEATION OF THE PROSTATE WITH MORCELLATION N/A 11/06/2018   Procedure: HOLEP-LASER ENUCLEATION OF THE PROSTATE WITH MORCELLATION;  Surgeon: [name], MD;  Location: [facility] ORS;  Service: Urology;  Laterality: N/A;      Family History: Family History  Problem Relation Age of Onset   Breast cancer Mother    Lung cancer Father    Heart disease Brother    Leukemia Brother    Prostate cancer Neg Hx    Colon cancer Neg Hx    Bladder Cancer Neg Hx    Kidney cancer Neg Hx     Social History:  reports that he quit smoking about 54 years ago. His smoking use included cigarettes. He has a 12.00 pack-year smoking history. He has never used smokeless tobacco. He reports that he does not drink alcohol and does not use drugs.  Physical  Exam: BP (!) 175/80   Pulse 64   Temp (!) 97.5 F (36.4 C) (Temporal)   Resp 16   Ht 6' (1.829 m)   Wt 98.9 kg   SpO2 96%   BMI 29.57 kg/m    Constitutional:  Alert and oriented, No acute distress. Cardiovascular: Regular rate and rhythm Respiratory: Clear to auscultation bilaterally GI: Abdomen is soft, nontender, nondistended, no abdominal masses   Laboratory Data: Urine culture 10/10/2020 no growth  Assessment & Plan:   86 year old male with bladder stone and residual obstructive appearing left lateral lobe and recurrent infections, here today for cystolitholapaxy and HOLEP of left lateral lobe.  We discussed the risks and benefits of HoLEP at length.  The procedure requires general anesthesia and takes 1 to 2 hours, and a holmium laser is used to enucleate the prostate and push this tissue into the bladder.  A morcellator is then used to remove this tissue, which is sent for pathology.  The vast majority(>95%) of patients are able to discharge the same day with a catheter in place for 2 to 3 days, and will follow-up in clinic for a voiding trial.  We specifically discussed the risks of bleeding, infection, retrograde ejaculation, temporary urgency and urge incontinence, very low risk of long-term incontinence, urethral stricture/bladder neck contracture, pathologic evaluation of prostate tissue and possible detection of prostate cancer or other  malignancy, and possible need for additional procedures.  Cystolitholapaxy and HOLEP of the left lateral lung   [name], MD 10/20/2020  [facility] [address] [place], [place] [address] [phone]

## 2020-10-20 NOTE — Discharge Instructions (Signed)
AMBULATORY SURGERY  ?DISCHARGE INSTRUCTIONS ? ? ?The drugs that you were given will stay in your system until tomorrow so for the next 24 hours you should not: ? ?Drive an automobile ?Make any legal decisions ?Drink any alcoholic beverage ? ? ?You may resume regular meals tomorrow.  Today it is better to start with liquids and gradually work up to solid foods. ? ?You may eat anything you prefer, but it is better to start with liquids, then soup and crackers, and gradually work up to solid foods. ? ? ?Please notify your doctor immediately if you have any unusual bleeding, trouble breathing, redness and pain at the surgery site, drainage, fever, or pain not relieved by medication. ? ? ? ?Additional Instructions: ? ? ? ?Please contact your physician with any problems or Same Day Surgery at [phone], Monday through Friday 6 am to 4 pm, or [place] at [place] Main number at [phone].  ?

## 2020-10-20 NOTE — Op Note (Signed)
Date of procedure: 10/20/20  Preoperative diagnosis:  BPH Bladder stone(3 cm) Recurrent UTIs  Postoperative diagnosis:  Same  Procedure: HoLEP (Holmium Laser Enucleation of the Prostate) Cystolitholapaxy(3 cm)  Surgeon: [name], MD  Anesthesia: General  Complications: None  Intraoperative findings:  Prominent left lateral lobe, 3 cm bladder stone fragmented and all pieces extracted Moderate to severe bladder trabeculations Uncomplicated HOLEP of remaining left lateral lobe and excellent hemostasis  EBL: Minimal  Specimens: Prostate chips  Enucleation time: 38 minutes  Morcellation time: 6 minutes  Intra-op weight: 22 g  Drains: 24 French three-way, 60 cc in balloon  Indication: Randy [name] is a 86 y.o. patient with history of Foley dependent urinary retention secondary to BPH who underwent a HOLEP in 2020.  Secondary to significant bleeding at that time the left lateral lobe was left in situ.  He was also found to have intermediate risk prostate cancer on that specimen, but PSA dropped to below 1 after surgery and was stable. He presented with recurrent urinary infections and some urinary frequency and occasional dysuria, and was found to have a 3 cm bladder stone on cystoscopy and prominent remaining left lateral lobe.  After reviewing the management options for treatment, they elected to proceed with the above surgical procedure(s). We have discussed the potential benefits and risks of the procedure, side effects of the proposed treatment, the likelihood of the patient achieving the goals of the procedure, and any potential problems that might occur during the procedure or recuperation.  We specifically discussed the risks of bleeding, infection, hematuria and clot retention, need for additional procedures, possible overnight hospital stay, temporary urgency and incontinence, rare long-term incontinence, and retrograde ejaculation.  Informed consent has been obtained.    Description of procedure:  The patient was taken to the operating room and general anesthesia was induced.  The patient was placed in the dorsal lithotomy position, prepped and draped in the usual sterile fashion, and preoperative antibiotics(Cipro and ampicillin) were administered.  SCDs were placed for DVT prophylaxis.  A preoperative time-out was performed.   Randy [name] sounds were used to gently dilated the urethra up to 87F. The 79 French continuous flow resectoscope was inserted into the urethra using the visual obturator  The prostate was notable for large obstructive left lateral lobe. The bladder was thoroughly inspected and notable for moderate to severe bladder trabeculations and a large black and yellow 3 cm stone.  The ureteral orifices were located in orthotopic position.  The laser was set to 1.5 J and 30 Hz and the stone was methodically fragmented to dust, all pieces were evacuated free from the bladder.  The laser was set to 2 J and 50 Hz and was used to make a lambda incision just proximal to the verumontanum down to the level of the capsule on the left side.  The left lateral lobe was enucleated into the bladder with a wide open channel.  The capsule was examined and laser was used for meticulous hemostasis.    The 72 French resectoscope was then switched out for the 55 French nephroscope and the lobes were morcellated and the tissue sent to pathology.  A 24 French three-way catheter was inserted easily, and 60 cc were placed in the balloon.  Urine was faint pink.  The catheter irrigated easily with a Toomey syringe.  CBI was initiated. A belladonna suppository was placed.  The patient tolerated the procedure well without any immediate complications and was extubated and transferred to the  recovery room in stable condition.  Urine was clear on fast CBI.  Disposition: Stable to PACU  Plan: Wean CBI in PACU, anticipate discharge home today with Foley removal in clinic in 2-3 days,  MD follow-up in 12 weeks with PVR  [name], MD 10/20/2020

## 2020-10-23 ENCOUNTER — Ambulatory Visit (INDEPENDENT_AMBULATORY_CARE_PROVIDER_SITE_OTHER): Payer: Medicare Other | Admitting: Physician Assistant

## 2020-10-23 ENCOUNTER — Other Ambulatory Visit: Payer: Self-pay

## 2020-10-23 ENCOUNTER — Ambulatory Visit: Payer: Medicare Other | Admitting: Physician Assistant

## 2020-10-23 DIAGNOSIS — N138 Other obstructive and reflux uropathy: Secondary | ICD-10-CM | POA: Diagnosis not present

## 2020-10-23 DIAGNOSIS — N401 Enlarged prostate with lower urinary tract symptoms: Secondary | ICD-10-CM

## 2020-10-23 LAB — BLADDER SCAN AMB NON-IMAGING

## 2020-10-23 NOTE — Progress Notes (Signed)
Afternoon follow-up  Patient returned to clinic this afternoon for repeat PVR. He reports drinking approximately 16oz of fluid. He has been able to urinate. He has not had urinary leakage. PVR 28mL.  Results for orders placed or performed in visit on 10/23/20  BLADDER SCAN AMB NON-IMAGING  Result Value Ref Range   Scan Result 46mL     Voiding trial passed. Counseled patient on normal postoperative findings including dysuria, gross hematuria, and urinary urgency/leakage.  Counseled him to wear absorbent underwear for security and start Kegel exercises to increase urinary control.  Surgical pathology pending, will defer to Dr. Diamantina Providence to share results when available.  Follow up: 10-week postop visit with Dr. Diamantina Providence with PVR

## 2020-10-23 NOTE — Patient Instructions (Signed)
Congratulations on your recent HOLEP procedure! As discussed in clinic today, there are three main side effects that commonly occur after surgery: Burning or pain with urination: This typically resolves within 1 week of surgery. If you are still having significant pain with urination 10 days after surgery, please call our clinic. We may need to check you for a urinary tract infection at that point, though this is rare. Blood in the urine: This may come and go, but typically resolves completely within 3 weeks of surgery. If you are on blood thinners, it may take longer for the bleeding to resolve. As long as your urine remains thin and runny and you are not passing large clots (around the size of your palm), this is a normal postoperative finding. If you start to pass dark red urine or thick, ketchup-like urine, please call our office immediately. Urinary leakage or urgency: This tends to improve with time, with most patients becoming dry within around 3 months of surgery. You may wear absorbant underwear or liners for security during this time. To help you get dry faster, please make sure you are completing your Kegel exercises as instructed, with a set of 10 exercises completed up to three times daily.  

## 2020-10-23 NOTE — Progress Notes (Signed)
Catheter Removal  Patient is present today for a catheter removal.  38ml of water was drained from the balloon. A 24FR foley cath was removed from the bladder no complications were noted . Patient tolerated well.  Performed by: Bradly Bienenstock CMA  Follow up/ Additional notes: RTC this afternoon for PVR

## 2020-10-24 ENCOUNTER — Telehealth: Payer: Self-pay

## 2020-10-24 LAB — SURGICAL PATHOLOGY

## 2020-10-24 NOTE — Chronic Care Management (AMB) (Signed)
  Care Management   Note  10/24/2020 Name: Randy [name] MRN: [id number] DOB: 1934-10-17  Randy [name] is a 86 y.o. year old male who is a primary care patient of [name], DO and is actively engaged with the care management team. I reached out to Ernesto Rutherford by phone today to assist with re-scheduling a follow up visit with the Pharmacist  Follow up plan: Unsuccessful telephone outreach attempt made. A HIPAA compliant phone message was left for the patient providing contact information and requesting a return call.  The care management team will reach out to the patient again over the next 7 days.  If patient returns call to provider office, please advise to call [facility]  at [place], [place], [place], [place] [address] Direct Dial: [phone] [email or web address] Website: [email or web address]

## 2020-10-31 ENCOUNTER — Ambulatory Visit: Payer: Medicare Other | Admitting: Licensed Clinical Social Worker

## 2020-10-31 ENCOUNTER — Other Ambulatory Visit: Payer: Self-pay

## 2020-10-31 ENCOUNTER — Other Ambulatory Visit: Payer: Medicare Other | Admitting: Primary Care

## 2020-10-31 DIAGNOSIS — I10 Essential (primary) hypertension: Secondary | ICD-10-CM

## 2020-10-31 DIAGNOSIS — R296 Repeated falls: Secondary | ICD-10-CM

## 2020-10-31 DIAGNOSIS — F0280 Dementia in other diseases classified elsewhere without behavioral disturbance: Secondary | ICD-10-CM

## 2020-10-31 DIAGNOSIS — G3183 Dementia with Lewy bodies: Secondary | ICD-10-CM

## 2020-10-31 DIAGNOSIS — G8929 Other chronic pain: Secondary | ICD-10-CM

## 2020-10-31 DIAGNOSIS — E782 Mixed hyperlipidemia: Secondary | ICD-10-CM

## 2020-11-01 NOTE — Telephone Encounter (Signed)
Pt has been rescheduled. 

## 2020-11-01 NOTE — Chronic Care Management (AMB) (Signed)
  Care Management   Note  11/01/2020 Name: SALIL RAINERI MRN: [id number] DOB: 1934-06-03  ARBY DAHIR is a 86 y.o. year old male who is a primary care patient of [name], DO and is actively engaged with the care management team. I reached out to Ernesto Rutherford by phone today to assist with re-scheduling a follow up visit with the Pharmacist  Follow up plan: Telephone appointment with care management team member scheduled for:11/24/2020  Noreene Larsson, [address], [place], [place] [address] Direct Dial: [phone] [email or web address] Website: [email or web address]

## 2020-11-05 NOTE — Patient Instructions (Signed)
Visit Information   Goals Addressed               This Visit's Progress     Patient Stated     SW-"I'm having a hard time managing my health" (pt-stated)   On track     Patient Self Care Activities:  Attend all scheduled provider appointments Call provider office for new concerns or questions Continue utilizing healthy coping strategies to manage stressors      Other     SW-Manage My Health and Emotions   On track     Timeframe:  Long-Range Goal Priority:  Medium  Start Date:  04/11/20                         Expected End Date:  02/06/21                Follow Up Date- 01/16/21  Patient Self Care Activities:  Attend all scheduled provider appointments Call provider office for new concerns or questions Continue utilizing healthy coping strategies to manage stressors        Patient's daughter verbalizes understanding of instructions provided today and agrees to view in [place].   Telephone follow up appointment with care management team member scheduled for:01/16/21  Christa See, MSW, [email or web address] Phone [phone] 1:04 AM

## 2020-11-05 NOTE — Chronic Care Management (AMB) (Signed)
Chronic Care Management    Clinical Social Work Note  11/05/2020 Name: Randy [name] MRN: [id number] DOB: [birth year]  Randy [name] is a 86 y.o. year old male who is a primary care patient of [name], DO. The CCM team was consulted to assist the patient with chronic disease management and/or care coordination needs related to: Level of Care Concerns and Mental Health Counseling and Resources.   Engaged with patient's daughter by telephone for follow up visit in response to provider referral for social work chronic care management and care coordination services.   Consent to Services:  The patient was given information about Chronic Care Management services, agreed to services, and gave verbal consent prior to initiation of services.  Please see initial visit note for detailed documentation.   Patient agreed to services and consent obtained.   Consent to Services:  The patient was given information about Care Management services, agreed to services, and gave verbal consent prior to initiation of services.  Please see initial visit note for detailed documentation.   Patient agreed to services today and consent obtained.  Engaged with patient's daughter by phone in response to provider referral for social work care coordination services:  Assessment/Interventions:  Patient continues to maintain positive progress with care plan goals. He is healing well from surgery conducted by Urology and continues to receive strong support from family. Palliative care continues to provide support and family are on the wait list for aid assistance through [facility] below for interventions and patient self-care activities.  Recent life changes or stressors: Management of health conditions  Recommendation: Patient may benefit from, and is in agreement work with LCSW to address care coordination needs and will continue to work with the clinical team to address health care and  disease management related needs.   Follow up Plan: Patient would like continued follow-up from CCM LCSW .  per patient's request will follow up in 01/16/21.  Will call office if needed prior to next encounter.   SDOH (Social Determinants of Health) assessments and interventions performed:    Advanced Directives Status: Not addressed in this encounter.  CCM Care Plan  Allergies  Allergen Reactions   Antihistamines, Chlorpheniramine-Type Other (See Comments)    Can't urinate    Outpatient Encounter Medications as of 10/31/2020  Medication Sig   acetaminophen (TYLENOL) 500 MG tablet Take 1,000 mg by mouth every 6 (six) hours as needed for moderate pain.   aspirin 81 MG chewable tablet Chew 81 mg by mouth 3 (three) times a week. (Patient not taking: Reported on 10/20/2020)   carbidopa-levodopa (SINEMET CR) 50-200 MG tablet Take 1 tablet by mouth 4 (four) times daily.   cholecalciferol (VITAMIN D) 1000 units tablet Take 1,000 Units by mouth daily.   Coenzyme Q10 (CO Q 10) 100 MG CAPS Take 100 mg by mouth daily.    CRANBERRY PO Take 1 capsule by mouth in the morning and at bedtime.   diclofenac Sodium (VOLTAREN) 1 % GEL APPLY 2 G TOPICALLY 4 (FOUR) TIMES DAILY AS NEEDED. TO KNEE ARTHRITIS PAIN   enalapril (VASOTEC) 10 MG tablet Take 1 tablet (10 mg total) by mouth daily.   fluticasone (FLONASE) 50 MCG/ACT nasal spray Place 2 sprays into both nostrils daily as needed for allergies.    furosemide (LASIX) 20 MG tablet Take 1 tablet (20 mg total) by mouth every other day.   Multiple Vitamin (MULTIVITAMIN WITH MINERALS) TABS tablet Take 1 tablet by mouth  daily.   Omega-3 Fatty Acids (FISH OIL) 1200 MG CAPS Take 1,200 mg by mouth in the morning, at noon, in the evening, and at bedtime.   omeprazole (PRILOSEC) 20 MG capsule Take 20 mg by mouth daily.   rivastigmine (EXELON) 1.5 MG capsule Take 1.5 mg by mouth 2 (two) times daily with a meal.   sertraline (ZOLOFT) 50 MG tablet Take 50 mg by mouth  at bedtime.   traMADol (ULTRAM) 50 MG tablet Take 1 tablet (50 mg total) by mouth every 8 (eight) hours as needed.   vitamin C (ASCORBIC ACID) 500 MG tablet Take 500 mg by mouth daily.   zinc sulfate 220 (50 Zn) MG capsule Take 1 capsule (220 mg total) by mouth daily. (Patient not taking: Reported on 10/11/2020)   No facility-administered encounter medications on file as of 10/31/2020.    Patient Active Problem List   Diagnosis Date Noted   Bilateral primary osteoarthritis of knee 02/15/2020   Pressure injury of buttock, stage 1    Delirium    COVID-19 virus infection 10/08/2019   Unable to care for self 10/08/2019   Caregiver unable to cope 10/08/2019   Recurrent falls 10/08/2019   Lewy body dementia without behavioral disturbance ([facility])    Prostate cancer ([facility]) 06/30/2019   BPH with urinary obstruction 11/06/2018   Acute cholecystitis 09/08/2018   Myalgia due to statin 02/24/2018   Tricompartment osteoarthritis of left knee 11/14/2017   Generalized weakness 04/28/2017   Chronic low back pain 03/12/2017   Knee pain, chronic 10/01/2016   Chronic venous insufficiency 08/30/2016   BPH without obstruction/lower urinary tract symptoms 03/06/2016   Osteoarthritis of multiple joints 03/06/2016   Degenerative joint disease (DJD) of lumbar spine 03/06/2016   Essential hypertension 03/05/2016   Hyperlipidemia 03/05/2016   Osteoporosis 03/05/2016   GERD (gastroesophageal reflux disease) 03/05/2016   Bilateral lower extremity edema 03/05/2016    Conditions to be addressed/monitored: Dementia; Level of care concerns, Mental Health Concerns , and Memory Deficits  Care Plan : General Social Work (Adult)  Updates made by Rebekah Chesterfield, LCSW since 11/05/2020 12:00 AM     Problem: Caregiver Stress      Long-Range Goal: Caregiver Coping Optimized for Daughter   Start Date: 04/11/2020  This Visit's Progress: On track  Recent Progress: On track  Priority: Medium  Note:   Timeframe:   Long-Range Goal Priority:  Medium  Start Date:  04/11/20                         Expected End Date:  02/06/21                Follow Up Date- 01/16/21  Current Barriers:  Financial constraints Level of care concerns ADL IADL limitations Social Isolation Limited access to caregiver Memory Deficits Lacks knowledge of community resource: personal care service resources within the area that may benefit patient Lack of socialization  Clinical Social Work Clinical Goal(s):  Over the next 120 days, client will work with SW to address concerns related to lack of care/support within the home Interventions: Patient's daughter provided all information during visit Patient completed ciprofloxacin; however, his symptoms have not resolved. Patient has a scheduled appointment with Urologist on 08/17/20 to further assess 10/25: Patient is healing from recent surgery performed by Urologist on 10/20/20 Patient continues to take Tylenol to assist with temporary pain management Family has not observed an increase in depression or anxiety symptoms 10/25: Per daughter,  patient's memory continues to decline. Pt's wife continues to provide support in the home. Patient remains independent when preparing simple meals; however, family is always available to assist. CCM LCSW discussed strategies to decrease fall risk. Family has installed shower rails and has a shower chair to promote safety [facility] has visited the home and completed referral for meal delivery. Family is appreciative for the support 10/25: Palliative care continues to provide in-home visits to strengthen support. Pt is on the waiting list with [facility] for assistance with an aid. CCM LCSW provided pt's daughter with contact information, so that she can check status of referral. Additional supportive resources to assist with caregiver support provided  Patient utilizes walker and/or rollator to promote safety while ambulating in and out of the  home CCM LCSW acknowledged caregiver stress. Strategies to enhance self-care were identified  CCM LCSW informed patient's daughter that [facility] has opened back up. Family has their contact information and agreed to contact them when ready 08/10: He is not interested in participating in day centers, at this time Discussed plans with patient for ongoing care management follow up and provided patient with direct contact information for care management team Advised patient to contact CCM team with any urgent concerns 1:1 collaboration with primary care provider regarding development and update of comprehensive plan of care as evidenced by provider attestation and co-signature Patient Self Care Activities:  Attend all scheduled provider appointments Call provider office for new concerns or questions Continue utilizing healthy coping strategies to manage stressors      Christa See, MSW, [email or web address] Phone [phone] 1:00 AM

## 2020-11-06 DIAGNOSIS — N400 Enlarged prostate without lower urinary tract symptoms: Secondary | ICD-10-CM

## 2020-11-06 DIAGNOSIS — G3183 Dementia with Lewy bodies: Secondary | ICD-10-CM | POA: Diagnosis not present

## 2020-11-06 DIAGNOSIS — E782 Mixed hyperlipidemia: Secondary | ICD-10-CM | POA: Diagnosis not present

## 2020-11-06 DIAGNOSIS — I10 Essential (primary) hypertension: Secondary | ICD-10-CM | POA: Diagnosis not present

## 2020-11-06 DIAGNOSIS — F0280 Dementia in other diseases classified elsewhere without behavioral disturbance: Secondary | ICD-10-CM

## 2020-11-13 ENCOUNTER — Ambulatory Visit (INDEPENDENT_AMBULATORY_CARE_PROVIDER_SITE_OTHER): Payer: Medicare Other

## 2020-11-13 ENCOUNTER — Telehealth: Payer: Medicare Other | Admitting: General Practice

## 2020-11-13 DIAGNOSIS — E782 Mixed hyperlipidemia: Secondary | ICD-10-CM

## 2020-11-13 DIAGNOSIS — F0280 Dementia in other diseases classified elsewhere without behavioral disturbance: Secondary | ICD-10-CM

## 2020-11-13 DIAGNOSIS — R413 Other amnesia: Secondary | ICD-10-CM

## 2020-11-13 DIAGNOSIS — N3941 Urge incontinence: Secondary | ICD-10-CM

## 2020-11-13 DIAGNOSIS — N401 Enlarged prostate with lower urinary tract symptoms: Secondary | ICD-10-CM

## 2020-11-13 DIAGNOSIS — G3183 Dementia with Lewy bodies: Secondary | ICD-10-CM

## 2020-11-13 DIAGNOSIS — I10 Essential (primary) hypertension: Secondary | ICD-10-CM

## 2020-11-13 DIAGNOSIS — N138 Other obstructive and reflux uropathy: Secondary | ICD-10-CM

## 2020-11-13 NOTE — Chronic Care Management (AMB) (Signed)
Chronic Care Management   CCM RN Visit Note  11/13/2020 Name: Randy [name] MRN: [id number] DOB: June 08, 1934  Subjective: Randy [name] is a 86 y.o. year old male who is a primary care patient of [name], DO. The care management team was consulted for assistance with disease management and care coordination needs.    Engaged with patient by telephone for follow up visit in response to provider referral for case management and/or care coordination services. Spoke to the patients daughter, [name] who is DRP also.   Consent to Services:  The patient was given information about Chronic Care Management services, agreed to services, and gave verbal consent prior to initiation of services.  Please see initial visit note for detailed documentation.   Patient agreed to services and verbal consent obtained.   Assessment: Review of patient past medical history, allergies, medications, health status, including review of consultants reports, laboratory and other test data, was performed as part of comprehensive evaluation and provision of chronic care management services.   SDOH (Social Determinants of Health) assessments and interventions performed:    CCM Care Plan  Allergies  Allergen Reactions   Antihistamines, Chlorpheniramine-Type Other (See Comments)    Can't urinate    Outpatient Encounter Medications as of 11/13/2020  Medication Sig   acetaminophen (TYLENOL) 500 MG tablet Take 1,000 mg by mouth every 6 (six) hours as needed for moderate pain.   aspirin 81 MG chewable tablet Chew 81 mg by mouth 3 (three) times a week. (Patient not taking: Reported on 10/20/2020)   carbidopa-levodopa (SINEMET CR) 50-200 MG tablet Take 1 tablet by mouth 4 (four) times daily.   cholecalciferol (VITAMIN D) 1000 units tablet Take 1,000 Units by mouth daily.   Coenzyme Q10 (CO Q 10) 100 MG CAPS Take 100 mg by mouth daily.    CRANBERRY PO Take 1 capsule by mouth in the morning and at bedtime.    diclofenac Sodium (VOLTAREN) 1 % GEL APPLY 2 G TOPICALLY 4 (FOUR) TIMES DAILY AS NEEDED. TO KNEE ARTHRITIS PAIN   enalapril (VASOTEC) 10 MG tablet Take 1 tablet (10 mg total) by mouth daily.   fluticasone (FLONASE) 50 MCG/ACT nasal spray Place 2 sprays into both nostrils daily as needed for allergies.    furosemide (LASIX) 20 MG tablet Take 1 tablet (20 mg total) by mouth every other day.   Multiple Vitamin (MULTIVITAMIN WITH MINERALS) TABS tablet Take 1 tablet by mouth daily.   Omega-3 Fatty Acids (FISH OIL) 1200 MG CAPS Take 1,200 mg by mouth in the morning, at noon, in the evening, and at bedtime.   omeprazole (PRILOSEC) 20 MG capsule Take 20 mg by mouth daily.   rivastigmine (EXELON) 1.5 MG capsule Take 1.5 mg by mouth 2 (two) times daily with a meal.   sertraline (ZOLOFT) 50 MG tablet Take 50 mg by mouth at bedtime.   traMADol (ULTRAM) 50 MG tablet Take 1 tablet (50 mg total) by mouth every 8 (eight) hours as needed.   vitamin C (ASCORBIC ACID) 500 MG tablet Take 500 mg by mouth daily.   zinc sulfate 220 (50 Zn) MG capsule Take 1 capsule (220 mg total) by mouth daily. (Patient not taking: Reported on 10/11/2020)   No facility-administered encounter medications on file as of 11/13/2020.    Patient Active Problem List   Diagnosis Date Noted   Bilateral primary osteoarthritis of knee 02/15/2020   Pressure injury of buttock, stage 1    Delirium    COVID-19 virus  infection 10/08/2019   Unable to care for self 10/08/2019   Caregiver unable to cope 10/08/2019   Recurrent falls 10/08/2019   Lewy body dementia without behavioral disturbance [facility])    Prostate cancer ([place]) 06/30/2019   BPH with urinary obstruction 11/06/2018   Acute cholecystitis 09/08/2018   Myalgia due to statin 02/24/2018   Tricompartment osteoarthritis of left knee 11/14/2017   Generalized weakness 04/28/2017   Chronic low back pain 03/12/2017   Knee pain, chronic 10/01/2016   Chronic venous insufficiency 08/30/2016    BPH without obstruction/lower urinary tract symptoms 03/06/2016   Osteoarthritis of multiple joints 03/06/2016   Degenerative joint disease (DJD) of lumbar spine 03/06/2016   Essential hypertension 03/05/2016   Hyperlipidemia 03/05/2016   Osteoporosis 03/05/2016   GERD (gastroesophageal reflux disease) 03/05/2016   Bilateral lower extremity edema 03/05/2016    Conditions to be addressed/monitored:HTN, HLD, Dementia, and urinary incontinence with enlarged prostate and recent bladder stone  Care Plan : RNCM: Dementia (Adult)  Updates made by Vanita Ingles, RN since 11/13/2020 12:00 AM  Completed 11/13/2020   Problem: RNCM: Management of Dementia and Memory Loss Resolved 11/13/2020  Priority: Medium     Long-Range Goal: RNCM: Management of Dementia and Memory Loss Completed 11/13/2020  Start Date: 01/27/2020  Expected End Date: 06/26/2021  Recent Progress: On track  Priority: Medium  Note:   Current Barriers: Resolving, duplicate goal Knowledge Deficits related to resources available for patient with dementia and memory loss  Lacks caregiver support.  Cognitive Deficits Unable to independently manage changes related to dementia and memory loss  Unable to self administer medications as prescribed Does not adhere to prescribed medication regimen Lacks social connections Unable to perform ADLs independently Unable to perform IADLs independently Does not contact provider office for questions/concerns  Nurse Case Manager Clinical Goal(s):   patient will verbalize understanding of plan for effective management of dementia and memory loss   patient will attend all scheduled medical appointments: no upcoming appointments but the patients daughter knows to call for changes or needs   patient will demonstrate improved adherence to prescribed treatment plan for dementia and memory loss as evidenced bypatient being as independent as possible and working with CCM team to optimize health and well  being related to dementia and memory loss.   Interventions:  1:1 collaboration with [name], DO regarding development and update of comprehensive plan of care as evidenced by provider attestation and co-signature Inter-disciplinary care team collaboration (see longitudinal plan of care) Evaluation of current treatment plan related to dementia and memoryt loss  and patient's adherence to plan as established by provider. 03-16-2020: The patients is about the same. The patients daughter states other than a little  complaint of dizziness the patient is doing well. Denies any safety concerns. 05-25-2020: The patients daughter states that she feels the patients dementia is worse. She states he is taking his medications but sometimes it may be at different times that he takes it. She notices that his mobility is different than what it has been and he seems to be walking "stiffer". Denies any falls. The daughter was at home with the patient today. 09-04-2020: The patient per his daughter is stable as far as his dementia. He has not had any new falls and is walking with his walker. He has been to several specialist recently and dealing with a recurrent UTI. The patient has a palliative visit with NP today. The daughter states he has good days and bad days. Denies  any acute findings. Will continue to monitor. 10-09-2020: The patients daughter states that he seems to be worse when he is in pain or there is something going on with his health.  Says the patient is stiff when walking. Denies any falls. Will continue to monitor. Advised patient to call the office for changes in condition or questions  Provided education to patient re: caregiver burn out and support available. 10-09-2020: Education and ongoing support given to the patients daughter for lewy body dementia. The patients daughter is very active in the patients care. The patients dementia is stable at this time. Will continue to monitor for changes.   Discussed plans with patient for ongoing care management follow up and provided patient with direct contact information for care management team Provided patient with dementia and memory loss  educational materials related to changes in memory. The daughter states that the patient seems to be in a "better mood" right now. 03-16-2020: Denies any changes in memory at this time. About the same. 09-04-2020: The patient has palliative services coming in. The NP is going out to the home today for a visit. Message sent through secure messaging center for evaluation of a urinary concern. The daughter states that she does not know if she should call the specialist or not. Will continue to monitor. Correspondence sent to the [facility] NP for expressed concern by the daughter.  Will also send in basket message to pcp. 10-09-2020: The patients daughter states that the patient seems to be some worse but feels it is due to the urinary symptoms going on right now. The patients daughter states the NP saw the patient and she is thankful for more eyes being on the patient. The patient had stopped taking his blood pressures but the daughter has him doing this again. Is hopeful for things to get better since he has a urology appointment tomorrow with a procedure.  Discussed the OTC benefits through [facility] and the ability to get the patient a phillips life line system for the patient to use. The daughter does not feel this is needed as her mother is with the patient at all times.   Patient Goals/Self-Care Activities , patient will:  - Patient will self administer medications as prescribed Patient will attend all scheduled provider appointments Patient will call pharmacy for medication refills Patient will attend church or other social activities Patient will continue to perform ADL's independently Patient will call provider office for new concerns or questions Patient will work with BSW to address care coordination needs and  will continue to work with the clinical team to address health care and disease management related needs.   - action plan for worsening symptoms mutually developed - community resource information provided - pain assessed - pain management plan developed - symptom review completed  Follow Up Plan: Telephone follow up appointment with care management team member scheduled for: 11-13-2020 at 0945 am        Care Plan : RNCM: Hypertension (Adult)  Updates made by Vanita Ingles, RN since 11/13/2020 12:00 AM  Completed 11/13/2020   Problem: RNCM: Hypertension (Hypertension) Resolved 11/13/2020  Priority: Medium     Long-Range Goal: RNCM: Hypertension Monitored Completed 11/13/2020  Start Date: 01/27/2020  Expected End Date: 06/26/2021  Recent Progress: On track  Priority: Medium  Note:   Objective: Resolving, duplicate goal Last practice recorded BP readings:  BP Readings from Last 3 Encounters:  09/14/20 (!) 164/87  08/25/20 130/70  08/17/20 (!) 156/91  Most recent eGFR/CrCl: No results found for: EGFR  No components found for: CRCL Current Barriers:  Knowledge Deficits related to basic understanding of hypertension pathophysiology and self care management Knowledge Deficits related to understanding of medications prescribed for management of hypertension Cognitive Deficits Limited Social Support Unable to independently manage HTN Unable to self administer medications as prescribed Does not adhere to prescribed medication regimen Lacks social connections Unable to perform ADLs independently Unable to perform IADLs independently Does not contact provider office for questions/concerns Case Manager Clinical Goal(s):   patient will verbalize understanding of plan for hypertension management  patient will demonstrate improved adherence to prescribed treatment plan for hypertension as evidenced by taking all medications as prescribed, monitoring and recording blood pressure as  directed, adhering to low sodium/DASH diet  patient will demonstrate improved health management independence as evidenced by checking blood pressure as directed and notifying PCP if SBP>160 or DBP > 90, taking all medications as prescribe, and adhering to a low sodium diet as discussed. Interventions:  Collaboration with [name], DO regarding development and update of comprehensive plan of care as evidenced by provider attestation and co-signature Inter-disciplinary care team collaboration (see longitudinal plan of care) UNABLE to independently:manage HTN Evaluation of current treatment plan related to hypertension self management and patient's adherence to plan as established by provider. 03-16-2020: The patients daughter is working closely with the pharm D and RNCM to manage blood pressure. The daughter states the patient still has edema and swelling but it is not worse than usual. Will continue to monitor. 05-25-2020: The patients daughter provided several readings for the [facility].  The patients systolic is running 290 to 211 and the diastolic is running 71 to 102. Heart rate is running 69 to 102.  The patients daughter states he is not complaining of headaches or any other issues related to hight blood pressure. 09-04-2020: The patients daughter states his blood pressures are still varying.  He has an appointment coming up for a vascular provider for evaluation of ongoing edema present in his legs. The daughter states she encourages him to elevate his legs and feet as much as possible when sitting. The patient has a hard time with getting compression hose on. Eduction on compression  hose with zippers in them. Did advise the daughter to discuss this with the specialist. The patient has no acute findings related to HTN. 10-09-2020: The patient still having fluctuations in blood pressures. Likely in part due to pain he is experiencing due to urinary sx and sx. The patient had stopped taking his  blood pressures and his daughter has encouraged him to start back again. Education and support given. Will continue to monitor.  Provided education to patient re: stroke prevention, s/s of heart attack and stroke, DASH diet, complications of uncontrolled blood pressure Reviewed medications with patient and discussed importance of compliance. 09-04-2020: The patient is compliant with medications regimen,, admits he may take at different times but he is taking is medications, also working with the pharm D on a regular basis. Denies any concerns with medication management or cost. The daughter did state that she thought the patient was going to take the COVID vaccine but once she got him in the car he decided he did not want to do it. She feels her mother talked him out of it. She told the patient it was his decision. 10-09-2020: Endorses compliance with medications at this time. Will continue to monitor.  Discussed plans with patient for ongoing care management  follow up and provided patient with direct contact information for care management team Advised patient, providing education and rationale, to monitor blood pressure daily and record, calling PCP for findings outside established parameters.  Patient Goals/Self-Care Activities  patient will:  - Self administers medications as prescribed Calls provider office for new concerns, questions, or BP outside discussed parameters Checks BP and records as discussed Follows a low sodium diet/DASH diet - blood pressure trends reviewed: 01-20-2020: 158/100 pulse 62, 01-21-2020 132/91, pulse 86; 01-23-2020 145/97, pulse 80.  The patient is also working with the CCM pharmacist to help with management of blood pressure medications. 05-25-2020: 05/09/2020 146/71 p-81, 05-10-2020: 142/100, p-72, 05-17-2020 150/97, p-73 - depression screen reviewed - home or ambulatory blood pressure monitoring encouraged Follow Up Plan: Telephone follow up appointment with care management team  member scheduled for: 11-13-2020 at 0945 am    Care Plan : RNCM: HLD management  Updates made by Vanita Ingles, RN since 11/13/2020 12:00 AM  Completed 11/13/2020   Problem: RNCM: HLD management Resolved 11/13/2020  Priority: Medium     Long-Range Goal: RNCM: HLD Management Completed 11/13/2020  Start Date: 01/27/2020  Expected End Date: 07/26/2021  Recent Progress: On track  Priority: Medium  Note:   Current Barriers: Resolving, duplicate goal Poorly controlled hyperlipidemia, complicated by memory loss, dementia, elevated blood pressures  Current antihyperlipidemic regimen: Omega 3 1200 mg daily  Most recent lipid panel:  Lab Results  Component Value Date   CHOL 156 07/17/2020   HDL 37 (L) 07/17/2020   LDLCALC 105 (H) 07/17/2020   TRIG 71 07/17/2020   CHOLHDL 4.2 07/17/2020    ASCVD risk enhancing conditions: age >16, HTN,  former smoker Unable to independently manage HLD  Lacks social connections Does not contact provider office for questions/concerns  RN Care Manager Clinical Goal(s):  patient will work with Consulting civil engineer, providers, and care team towards execution of optimized self-health management plan  patient will verbalize understanding of plan for effective management of HLD   patient will work with [place], CCM team and pcp to address needs related to management of HLD   Interventions: Collaboration with [place], Devonne Doughty, DO regarding development and update of comprehensive plan of care as evidenced by provider attestation and co-signature Inter-disciplinary care team collaboration (see longitudinal plan of care) Medication review performed; medication list updated in electronic medical record.  Inter-disciplinary care team collaboration (see longitudinal plan of care) Referred to pharmacy team for assistance with HLD medication management Evaluation of current treatment plan related to HLD and patient's adherence to plan as established by provider.  05-25-2020: The patient saw pcp 05-05-2020. The patient is overall doing about the same but has worsening memory issues. The patients daughter is with the patient a lot but is wanting additional help. Referrals are in place and will reach out to LCSW for assistance. 09-04-2020: The patient has seen several providers recently related to UTI. The patient has upcoming appointments with the vascular specialist for evaluation of possible PVD. The patients daughter states he is doing okay just issues with UTI.  He has a planned visit with the NP from [facility] [facility] today. Will continue to monitor. 10-09-2020: The patients daughter states the patient is eating well and denies any concerns with HLD health and well being. Will continue to monitor. Advised patient to call the office with changes or new questions  Provided education to patient re: heart healthy diet. 10-09-2020: The patient is eating well and the patients daughter tries to make  sure he watches sodium and fats. Discussed plans with patient for ongoing care management follow up and provided patient with direct contact information for care management team   Patient Goals/Self-Care Activities: patient will:   - call for medicine refill 2 or 3 days before it runs out - call if I am sick and can't take my medicine - keep a list of all the medicines I take; vitamins and herbals too - learn to read medicine labels - use a pillbox to sort medicine - use an alarm clock or phone to remind me to take my medicine - drink 6 to 8 glasses of water each day - eat 3 to 5 servings of fruits and vegetables each day - eat 5 or 6 small meals each day - limit fast food meals to no more than 1 per week - manage portion size - prepare main meal at home 3 to 5 days each week - read food labels for fat, fiber, carbohydrates and portion size - be open to making changes - I can manage, know and watch for signs of a heart attack - if I have chest pain, call for help - learn  about small changes that will make a big difference - learn my personal risk factors - education plan reviewed and/or amended - empathy and reassurance conveyed - family/caregiver participation in learning encouraged - health literacy screen reviewed - patient's preferred learning methods utilized - privacy ensured - questions encouraged - readiness to learn monitored   Follow Up Plan: Telephone follow up appointment with care management team member scheduled for: 11-13-2020 at 0945 am      Care Plan : RNCM: Urinary Incontinence (Adult) and frequent UTI  Updates made by Vanita Ingles, RN since 11/13/2020 12:00 AM  Completed 11/13/2020   Problem: RNCM: Symptom Management (Urinary Incontinence) and UTI Resolved 11/13/2020  Priority: High     Long-Range Goal: RNCM: Urinary Incontinence Symptoms Manged Completed 11/13/2020  Start Date: 09/04/2020  Expected End Date: 09/04/2021  Recent Progress: On track  Priority: High  Note:   Current Barriers: Resolving, duplicate goal Ineffective Self Health Maintenance in a patient with  Urinary incontinence and frequent UTI Unable to independently manage UTI and urinary incontinence  Does not contact provider office for questions/concerns Clinical Goal(s):  Collaboration with [name], DO regarding development and update of comprehensive plan of care as evidenced by provider attestation and co-signature Inter-disciplinary care team collaboration (see longitudinal plan of care) patient will work with care management team to address care coordination and chronic disease management needs related to Disease Management Educational Needs Care Coordination Other frequent UTI    Interventions:  Evaluation of current treatment plan related to  urinary incontinence and frequent UTI ,  self-management and patient's adherence to plan as established by provider. 09-04-2020: The patient has seen the pcp and specialist recently for frequent UTI  and urinary incontinence. The patients daughter states the patient thinks the antibiotics are not working but after talking with the patient and her mother the patients daughter things the burning may be coming from where he has irritation due to wearing depends because of his incontinence.  The palliative NP is going out to the home today and a secure message has been sent to ask her to evaluate the patient and given recommendations. Will also collaborate with pcp. The patients daughter instructed to call the office for worsening sx and sx or changes. Will continue to monitor. 10-09-2020: The patients daughter states the patient  is still having urinary concerns and it has been going on for 3 months. The patient is going tomorrow to see urology and will have the scope run. The patients daughter is hopeful there will be answers tomorrow. He has had clear UA's and she states that she is tired of them giving him new antibiotics because it is not fixing the issue. The patient had some of his prostate removed a couple of years ago. The patient is complaining of sharpe pain at times and burning. The patient sometimes can not fully explain his symptoms and gets frustrated. Empathetic listening and support given. Will continue to monitor.  Collaboration with [name], DO regarding development and update of comprehensive plan of care as evidenced by provider attestation       and co-signature Inter-disciplinary care team collaboration (see longitudinal plan of care) Discussed plans with patient for ongoing care management follow up and provided patient with direct contact information for care management team Self Care Activities:  Patient verbalizes understanding of plan to effectively manage frequent UTI's and urinary incontinence  Self administers medications as prescribed Attends all scheduled provider appointments Calls pharmacy for medication refills Attends church or other social  activities Performs ADL's independently Calls provider office for new concerns or questions Patient Goals: bladder-training plan developed - counseling by pharmacist provided - healthy lifestyle promoted - incontinence product use encouraged - medication side effects managed - medication-adherence assessment completed - participation in physical therapy encouraged - practice of bladder-training techniques encouraged - practice of pelvic floor exercises encouraged - response to pharmacologic therapy monitored - strategies to manage constipation promoted - strategies to manage symptom triggers promoted - symptom triggers identified    Follow Up Plan: Telephone follow up appointment with care management team member scheduled for: 11-13-2020 at 0945 am    Care Plan : RNCM: General Plan of Care (Adult) For Chronic Disease Management and Care Coordination Needs  Updates made by Vanita Ingles, RN since 11/13/2020 12:00 AM     Problem: RNCM; Development of Plan of Care for Chronic Disease Management ( lewy body dementia, HTN, HLD, Urinary Incontinence)   Priority: High     Long-Range Goal: RNCM; Effective Management  of Plan of Care for Chronic Disease Management ( lewy body dementia, HTN, HLD, Urinary Incontinence)   Start Date: 11/13/2020  Expected End Date: 11/13/2021  Priority: High  Note:   Current Barriers:  Knowledge Deficits related to plan of care for management of HTN, HLD, and Demential and Urinary Incontinence with enlarged prostate  Care Coordination needs related to Memory Deficits and family support and education due to Lewy Body Dementia  Chronic Disease Management support and education needs related to HTN, HLD, and Dementia and urinary Incontinence  Cognitive Deficits  RNCM Clinical Goal(s):  Patient will verbalize understanding of plan for management of HTN, HLD, Dementia, and Urinary Incontinence with recent bladder stone as evidenced by effective management of  chronic condtions without any exacerbations and maintaining health and well being take all medications exactly as prescribed and will call provider for medication related questions as evidenced by compliance with medications and working with the pharm D for questions or concerns related to medications    attend all scheduled medical appointments: no upcoming with pcp but has with specialist  as evidenced by keeping appointments and calling the office for changes in condition or new concerns that need to be addressed         continue to work with RN Care Manager  and/or Education officer, museum to address care management and care coordination needs related to HTN, HLD, Dementia, and Urinary incontinence  as evidenced by adherence to CM Team Scheduled appointments     demonstrate a decrease in Urinary incontinence with recent bladder stone, and changes in memory due to lewy body demential exacerbations  as evidenced by stable conditions, no acute exacerbations and working with the CCM team to manage health and well being demonstrate ongoing self health care management ability of effective management of chronic conditions as evidenced by  working with the CCM team  through collaboration with Consulting civil engineer, provider, and care team.   Interventions: 1:1 collaboration with primary care provider regarding development and update of comprehensive plan of care as evidenced by provider attestation and co-signature Inter-disciplinary care team collaboration (see longitudinal plan of care) Evaluation of current treatment plan related to  self management and patient's adherence to plan as established by provider   SDOH Barriers (Status: Goal on Track (progressing): YES.) Long Term Goal  Patient interviewed and SDOH assessment performed        Patient interviewed and appropriate assessments performed Provided patient with information about resources in the county and care guides avaialble to assist with changes in SDOH or  new needs as they arise Discussed plans with patient for ongoing care management follow up and provided patient with direct contact information for care management team Advised patient to call the office for changes in SDOH, Question, or Concerns Provided education to patient/caregiver regarding level of care options.    Urinary Incontinence with enlarged prostate and recent bladder stone  (Status: Goal on Track (progressing): YES.) Long Term Goal  Evaluation of current treatment plan related to  Urinary incontinence with enlarged prostate and recent bladder stone  , Level of care concerns and Memory Deficits self-management and patient's adherence to plan as established by provider. 11-13-2020: The patient recently had bladder stone removed on 10-20-2020. The patients daughter states that since removal of the bladder stone the patient denies any pain on urination and states he feels better. The daughter has taken the patient to follow ups with urologist. Review of sx and sx to monitor for changes in urinary health and well being. Discussed plans with patient for ongoing care management follow up and provided patient with direct contact information for care management team Advised patient to call the office for changes in urinary health, questions or concerns; Provided education to patient re: monitoring for sx and sx of UTI, increased pain on urinarion, new concerns related to urinary incontinence ; Reviewed medications with patient and discussed compliance, the wife and daughter assist with medications management ; Reviewed scheduled/upcoming provider appointments including Decemember 2022, with Urology specialist  The patien thas no appointment with the pcp coming up but education provided to the daughter to call the office for changes; Discussed plans with patient for ongoing care management follow up and provided patient with direct contact information for care management team; Advised patient to  discuss new development of urinary sx and sx with provider; Screening for signs and symptoms of depression related to chronic disease state;  Assessed social determinant of health barriers;   Hyperlipidemia:  (Status: Goal on Track (progressing): YES.) Lab Results  Component Value Date   CHOL 156 07/17/2020   HDL 37 (L) 07/17/2020   LDLCALC 105 (H) 07/17/2020   TRIG 71 07/17/2020   CHOLHDL 4.2 07/17/2020     Medication review performed; medication list updated in electronic medical record.  Provider  established cholesterol goals reviewed; Counseled on importance of regular laboratory monitoring as prescribed; Provided HLD educational materials; Reviewed role and benefits of statin for ASCVD risk reduction; Discussed strategies to manage statin-induced myalgias; Reviewed importance of limiting foods high in cholesterol;  Hypertension: (Status: Goal on track: NO.) Last practice recorded BP readings:  BP Readings from Last 3 Encounters:  10/20/20 (!) 162/76  10/10/20 134/84  09/14/20 (!) 164/87  Most recent eGFR/CrCl: No results found for: EGFR  No components found for: CRCL  Evaluation of current treatment plan related to hypertension self management and patient's adherence to plan as established by provider. 11-13-2020: The patient has long standing issues with his blood pressures fluctuating. The patient does take his blood pressures at home. The patients daughter and wife assist with his care. States compliance with medications. Will continue to monitor.    Reviewed prescribed diet heart healthy diet  Reviewed medications with patient and discussed importance of compliance;  Discussed plans with patient for ongoing care management follow up and provided patient with direct contact information for care management team; Advised patient, providing education and rationale, to monitor blood pressure daily and record, calling PCP for findings outside established parameters;  Advised  patient to discuss blood pressure trends  with provider; Provided education on prescribed diet heart healthy diet ;  Discussed complications of poorly controlled blood pressure such as heart disease, stroke, circulatory complications, vision complications, kidney impairment, sexual dysfunction;     Lewy Body Dementia   (Status: Goal on Track (progressing): YES.) Long Term Goal  Evaluation of current treatment plan related to Dementia, Level of care concerns and Memory Deficits self-management and patient's adherence to plan as established by provider. 11-13-2020: The patient saw the neurologist recently and had changes in medications. A consult for speech therapy was made. The patients daughter states that she has not heard from them yet. Sometimes the patient has a hard time with his words and needs. Education and support given. Will continue to monitor.  Discussed plans with patient for ongoing care management follow up and provided patient with direct contact information for care management team Advised patient to call the office for changes in memory, depression, anxiety, mood; Provided education to patient re: The 36 hour day resources for caregivers that have loved ones with dementia; Reviewed medications with patient and discussed compliance. The patient saw the neurologist recently and his rivastigmine was increased from the original 1.5 mg BID; Provided patient with resources to help with understanding Lewy body dementia educational materials related to progression of lewy body dementia; Reviewed scheduled/upcoming provider appointments including saw neurologist recently and keeps appointments as scheduled, knows to call the pcp office for new needs or concerns; Discussed plans with patient for ongoing care management follow up and provided patient with direct contact information for care management team; Advised patient to discuss progression and changes with provider; Screening for signs  and symptoms of depression related to chronic disease state;  Assessed social determinant of health barriers;    Patient Goals/Self-Care Activities: Patient will self administer medications as prescribed as evidenced by self report/primary caregiver report  Patient will attend all scheduled provider appointments as evidenced by clinician review of documented attendance to scheduled appointments and patient/caregiver report Patient will call pharmacy for medication refills as evidenced by patient report and review of pharmacy fill history as appropriate Patient will attend church or other social activities as evidenced by patient report Patient will continue to perform ADL's independently as evidenced by patient/caregiver report  Patient will call provider office for new concerns or questions as evidenced by review of documented incoming telephone call notes and patient report Patient will work with BSW to address care coordination needs and will continue to work with the clinical team to address health care and disease management related needs as evidenced by documented adherence to scheduled care management/care coordination appointments - check blood pressure 3 times per week - choose a place to take my blood pressure (home, clinic or office, retail store) - write blood pressure results in a log or diary - learn about high blood pressure - keep a blood pressure log - take blood pressure log to all doctor appointments - call doctor for signs and symptoms of high blood pressure - develop an action plan for high blood pressure - keep all doctor appointments - take medications for blood pressure exactly as prescribed - report new symptoms to your doctor - eat more whole grains, fruits and vegetables, lean meats and healthy fats - call for medicine refill 2 or 3 days before it runs out - take all medications exactly as prescribed - call doctor with any symptoms you believe are related to your  medicine - call doctor when you experience any new symptoms - go to all doctor appointments as scheduled - adhere to prescribed diet: Heart Healthy Diet       Plan:Telephone follow up appointment with care management team member scheduled for:  01-15-2021 at 0900 am  Noreene Larsson RN, MSN, [place] Mobile: [phone]

## 2020-11-13 NOTE — Patient Instructions (Signed)
Visit Information  Patient verbalizes understanding of instructions provided today and agrees to view in [place].   Telephone follow up appointment with care management team member scheduled for: 01-15-2021 at 0900 am  Noreene Larsson RN, MSN, [place] Mobile: [phone]

## 2020-11-21 ENCOUNTER — Other Ambulatory Visit: Payer: Self-pay

## 2020-11-21 ENCOUNTER — Other Ambulatory Visit: Payer: Medicare Other | Admitting: Primary Care

## 2020-11-21 DIAGNOSIS — Z789 Other specified health status: Secondary | ICD-10-CM

## 2020-11-21 DIAGNOSIS — Z515 Encounter for palliative care: Secondary | ICD-10-CM

## 2020-11-21 DIAGNOSIS — M170 Bilateral primary osteoarthritis of knee: Secondary | ICD-10-CM

## 2020-11-21 DIAGNOSIS — N138 Other obstructive and reflux uropathy: Secondary | ICD-10-CM

## 2020-11-21 DIAGNOSIS — F0280 Dementia in other diseases classified elsewhere without behavioral disturbance: Secondary | ICD-10-CM

## 2020-11-21 NOTE — Progress Notes (Signed)
[facility] Palliative Care Consult Note Telephone: [phone]  Fax: [phone]    Date of encounter: 11/21/20 4:28 PM PATIENT NAME: Randy [name] [address] [place] [address]   [phone] (home)  DOB: 06-03-1934 MRN: [id number] PRIMARY CARE PROVIDER:    [name], DO,  [place] [address] [phone]  REFERRING PROVIDER:   [name], DO [address] [place],  [place] [address] [phone]  RESPONSIBLE PARTY:    Contact Information     Name Relation Home Work Mobile   Randy [name],Randy [name] Daughter [phone]  [phone]   [place], [place] [phone]          I met face to face with patient and family in  home. Palliative Care was asked to follow this patient by consultation request of  Randy [name] * to address advance care planning and complex medical decision making. This is a follow up visit.                                   ASSESSMENT AND PLAN / RECOMMENDATIONS:   Advance Care Planning/Goals of Care: Goals include to maximize quality of life and symptom management. Our advance care planning conversation included a discussion about:     Exploration of personal, cultural or spiritual beliefs that might influence medical decisions  Identification of a healthcare agent - daughter and poa not in attendance, appt made to accommodate work schedule.  MOST forms left again, reviewed purpose. CODE STATUS: FULL   Symptom Management/Plan:   Nutrition: Appetite is good, eating well and appears WNWD.  Edema: Has 2-3+ in bil LE, has recliner and puts them up some times. Needs to hydrate skin and protect.  Urination: denies problems, has had procedure recently. Denies hematuria and no pain now, some incontinence but was told this could last 3 months.  Mobility: Able to answer door using rollator, recently fell out of bed. Has a good ramp for getting in and out of the house. Daughter  drives  but patient can get in and out of the car. States he has increased rigidity. No changes made in PD meds.  Memory: Recently increase by neuro to exelon 3 mg bid, and referral to [facility] for ST, no contact as of yet ( 2 weeks). I noted this increase in his [facility] med list as it was not there.  Follow up Palliative Care Visit: Palliative care will continue to follow for complex medical decision making, advance care planning, and clarification of goals. Return 12 weeks or prn.  I spent 60 minutes providing this consultation. More than 50% of the time in this consultation was spent in counseling and care coordination.  PPS: 50%  HOSPICE ELIGIBILITY/DIAGNOSIS: TBD  Chief Complaint: mobility  HISTORY OF PRESENT ILLNESS:  Randy [name] is a 86 y.o. year old male  with PD, lewy body dementia, rigidity, immobility .   History obtained from review of EMR, discussion with primary team, and interview with family, facility staff/caregiver and/or Randy [name].  I reviewed available labs, medications, imaging, studies and related documents from the EMR.  Records reviewed and summarized above.   ROS  Genral: NAD EYES: denies vision changes ENMT: denies dysphagia, no recent dental visits Cardiovascular: denies chest pain, denies DOE Pulmonary: denies cough, denies increased SOB Abdomen: endorses good appetite, denies constipation, endorses continence of bowel GU: denies dysuria, endorses occ incontinence of urine  MSK:  denies weakness,  + falls reported Skin: denies rashes or wounds Neurological: endorses pain with walking, uses tiger balm,  denies insomnia, wife endorses increased  rigidity Psych: Endorses positive mood Heme/lymph/immuno: denies bruises, abnormal bleeding  Physical Exam: Current and past weights: 237 lbs in record Constitutional: NAD General: frail appearing, wnwd EYES: anicteric sclera, lids intact, no discharge  ENMT: intact hearing, oral mucous membranes moist, dentition  intact CV: 3+LE edema Pulmonary: no increased work of breathing, no cough, room air Abdomen: intake 100%,  no ascites GU: deferred MSK: mild  sarcopenia, moves all extremities, ambulatory with walker Skin: warm and dry, no rashes or wounds on visible skin Neuro:  ++ generalized weakness,  mod cognitive impairment Psych: non-anxious affect, A and O x 2-3 Hem/lymph/immuno: no widespread bruising  Thank you for the opportunity to participate in the care of Randy [name].  The palliative care team will continue to follow. Please call our office at [phone] if we can be of additional assistance.   Randy Coop, NP DNP, AGPCNP-BC  COVID-19 PATIENT SCREENING TOOL Asked and negative response unless otherwise noted:   Have you had symptoms of covid, tested positive or been in contact with someone with symptoms/positive test in the past 5-10 days?

## 2020-11-24 ENCOUNTER — Ambulatory Visit: Payer: Medicare Other | Admitting: Pharmacist

## 2020-11-24 DIAGNOSIS — F0280 Dementia in other diseases classified elsewhere without behavioral disturbance: Secondary | ICD-10-CM

## 2020-11-24 DIAGNOSIS — I10 Essential (primary) hypertension: Secondary | ICD-10-CM

## 2020-11-24 DIAGNOSIS — G3183 Dementia with Lewy bodies: Secondary | ICD-10-CM

## 2020-11-24 NOTE — Chronic Care Management (AMB) (Signed)
Chronic Care Management CCM Pharmacy Note  11/24/2020 Name:  Randy [name] MRN:  [id number] DOB:  12-Jul-1934   Subjective: Randy [name] is an 86 y.o. year old male who is a primary patient of [name], DO.  The CCM team was consulted for assistance with disease management and care coordination needs.    Engaged with patient by telephone for follow up visit for pharmacy case management and/or care coordination services.   Objective:  Medications Reviewed Today     Reviewed by [name], RPH-CPP (Pharmacist) on 11/24/20 at [phone]  Med List Status: <None>   Medication Order Taking? Sig Documenting Provider Last Dose Status Informant  acetaminophen (TYLENOL) 500 MG tablet [id number] Yes Take 1,000 mg by mouth every 6 (six) hours as needed for moderate pain. [provider] Taking Active Family Member  aspirin 81 MG chewable tablet [id number] No Chew 81 mg by mouth 3 (three) times a week.  Patient not taking: Reported on 10/20/2020   [provider] Not Taking Active Family Member  carbidopa-levodopa (SINEMET CR) 50-200 MG tablet [id number] Yes Take 1 tablet by mouth 4 (four) times daily. [provider] Taking Active Family Member  cholecalciferol (VITAMIN D) 1000 units tablet [id number] Yes Take 1,000 Units by mouth daily. [provider] Taking Active Family Member  Coenzyme Q10 (CO Q 10) 100 MG CAPS [id number] Yes Take 100 mg by mouth daily.  [provider] Taking Active Family Member  diclofenac Sodium (VOLTAREN) 1 % GEL [id number] No APPLY 2 G TOPICALLY 4 (FOUR) TIMES DAILY AS NEEDED. TO KNEE ARTHRITIS PAIN  Patient not taking: Reported on 11/24/2020   [name], DO Not Taking Active Family Member  enalapril (VASOTEC) 10 MG tablet [id number] Yes Take 1 tablet (10 mg total) by mouth daily. [name], DO Taking Active Family Member  fluticasone [facility]) 50 MCG/ACT nasal spray [id number] Yes  Place 2 sprays into both nostrils daily as needed for allergies.  [provider] Taking Active Family Member           Med Note [name], [name]   Mon Oct 26, 2018  1:07 PM)    furosemide (LASIX) 20 MG tablet [id number] Yes Take 1 tablet (20 mg total) by mouth every other day. Kate Sable, MD Taking Active Family Member  Multiple Vitamin (MULTIVITAMIN WITH MINERALS) TABS tablet [id number] Yes Take 1 tablet by mouth daily. [name], MD Taking Active Family Member  Omega-3 Fatty Acids (FISH OIL) 1200 MG CAPS [id number] Yes Take 1,200 mg by mouth in the morning, at noon, in the evening, and at bedtime. [provider] Taking Active Family Member  omeprazole (PRILOSEC) 20 MG capsule [id number] Yes Take 20 mg by mouth daily. [provider] Taking Active Family Member  rivastigmine (EXELON) 3 MG capsule [id number] Yes Take 3 mg by mouth 2 (two) times daily. [provider] Taking Active   sertraline (ZOLOFT) 50 MG tablet [id number] Yes Take 50 mg by mouth at bedtime. [provider] Taking Active Family Member  traMADol (ULTRAM) 50 MG tablet [id number] No Take 1 tablet (50 mg total) by mouth every 8 (eight) hours as needed.  Patient not taking: Reported on 11/24/2020   [name], DO Not Taking Active Family Member  vitamin C (ASCORBIC ACID) 500 MG tablet [id number] Yes Take 500 mg by mouth daily. [provider] Taking Active Family Member  zinc sulfate 220 (50 Zn) MG capsule [id number] Yes Take 1 capsule (220  mg total) by mouth daily. [name], MD Taking Active Family Member            Pertinent Labs:   Lab Results  Component Value Date   CHOL 156 07/17/2020   HDL 37 (L) 07/17/2020   LDLCALC 105 (H) 07/17/2020   TRIG 71 07/17/2020   CHOLHDL 4.2 07/17/2020   Lab Results  Component Value Date   CREATININE 0.90 10/17/2020   BUN 21 10/17/2020   NA 141 10/17/2020   K 3.8 10/17/2020   CL 104 10/17/2020    CO2 30 10/17/2020   BP Readings from Last 3 Encounters:  10/20/20 (!) 162/76  10/10/20 134/84  09/14/20 (!) 164/87   Pulse Readings from Last 3 Encounters:  10/20/20 (!) 54  10/10/20 78  09/14/20 69     SDOH:  (Social Determinants of Health) assessments and interventions performed:    [place]  Review of patient past medical history, allergies, medications, health status, including review of consultants reports, laboratory and other test data, was performed as part of comprehensive evaluation and provision of chronic care management services.   Care Plan : PharmD - Medication Managment  Updates made by [name], RPH-CPP since 11/24/2020 12:00 AM     Problem: Disease Progression      Long-Range Goal: Disease Progression Prevented or Minimized   Start Date: 01/19/2020  Expected End Date: 04/18/2020  This Visit's Progress: On track  Recent Progress: On track  Priority: High  Note:   Current Barriers:  Chronic Disease Management support, education, and care coordination needs related to hypertension, osteoarthritis, hyperlipidemia, insomnia and memory loss Cognitive Deficits  Pharmacist Clinical Goal(s):  Over the next 90 days, caregiver/patient will adhere to plan to optimize therapeutic regimen for hypertension as evidenced by report of adherence to recommended medication management changes through collaboration with PharmD and provider.   Interventions: 1:1 collaboration with [name], DO regarding development and update of comprehensive plan of care as evidenced by provider attestation and co-signature Inter-disciplinary care team collaboration (see longitudinal plan of care) Perform chart review Palliative Care Home Visit on 11/15 Office Visit with [facility] Neurology on 11/2. Provider advised patient: Increase Rivastigmine: Please take 3 mg two times a day with meals Continue Sinemet CR 50/200 - 1 tablet four times  daily Continue/refill Sertraline 50 mg (for mood)  Okay to take Melatonin 3 mg at night for REM behavior disorder  Order placed for home health consult with speech therapy (cognitive training) Office Visit with [place] on 10/17 for post-procedure follow up Patient admitted to [facility] on 10/14 as scheduled for Holmium Laser Enucleation of Prostate (Holep) and a Cystolitholapaxy surgery Today caregiver reports patient doing better since urology procedure Reports had an episode of vomiting within the last week, which daughter attributes to patient laying down too soon after eating. Reports this has happened occasionally in the past.  Counsel caregiver to contact office if further episodes of vomiting or new/worsening symptoms Reports patient now scheduled [facility] Reports has phone number for [facility] to follow up regarding consult with speech therapy referral Encourage to obtain annual influenza vaccination  Hypertension Patient followed by [facility] Reports patient taking: Enalapril 10 mg daily in the morning Furosemide 20 mg on Mondays, Wednesdays and Fridays Reports has been monitoring home BP and keeping log, but daughter does not have record with her today From review of chart, note  at Office Visit with Neurology on 11/2: BP 122/80, HR 78 Encourage caregiver to continue to monitor home BP, keep log, bring this record to medical appointments and contact office for readings outside of established parameters   Hyperlipidemia: Uncontrolled; current treatment: none Have counseled on low-cholesterol diet, including having well-balanced meals that include vegetables and fruit and limiting saturated fats   Medication Adherence Note patient using weekly pillbox with slots for four times daily dosing Patient continues to need assistance from family for remembering four  times/day dosing Have discussed with caregiver additional strategies for using medication alarm as adherence aid  Reports that she and her mom manage patient's as needed medications, including tramadol (but reports patient using tramadol rarely)  Patient Goals/Self-Care Activities Over the next 90 days, caregiver/patient will:  - take medications as prescribed - check blood pressure, document, and provide at future appointments  Follow Up Plan: Telephone follow up appointment with care management team member scheduled for: 02/05/2021 at 8:30 am      Wallace Cullens, PharmD, [place], [place] [phone]

## 2020-11-24 NOTE — Patient Instructions (Signed)
Visit Information  Thank you for taking time to visit with me today. Please don't hesitate to contact me if I can be of assistance to you before our next scheduled telephone appointment.  Telephone follow up appointment with care management team member scheduled for:02/05/2021 at 8:30 am  If you need to cancel or re-schedule our visit, please call [phone] and our care guide team will be happy to assist you.  Following is a list of the goals we discussed today:  Please continue to monitor home blood pressure and keep log of results and have this record to review during our next appointment.    Feel free to call me with any questions or concerns. I look forward to our next call!  Wallace Cullens, PharmD, [place] [phone]  The patient verbalized understanding of instructions, educational materials, and care plan provided today and declined offer to receive copy of patient instructions, educational materials, and care plan.

## 2020-12-01 ENCOUNTER — Other Ambulatory Visit: Payer: Self-pay | Admitting: Urology

## 2020-12-01 ENCOUNTER — Other Ambulatory Visit: Payer: Self-pay | Admitting: Family Medicine

## 2020-12-01 DIAGNOSIS — I10 Essential (primary) hypertension: Secondary | ICD-10-CM

## 2020-12-05 ENCOUNTER — Other Ambulatory Visit: Payer: Self-pay

## 2020-12-05 ENCOUNTER — Ambulatory Visit (INDEPENDENT_AMBULATORY_CARE_PROVIDER_SITE_OTHER): Payer: Medicare Other | Admitting: Family Medicine

## 2020-12-05 ENCOUNTER — Encounter: Payer: Self-pay | Admitting: Family Medicine

## 2020-12-05 VITALS — BP 130/80 | HR 74 | Resp 15 | Ht 72.0 in | Wt 211.6 lb

## 2020-12-05 DIAGNOSIS — J0110 Acute frontal sinusitis, unspecified: Secondary | ICD-10-CM | POA: Diagnosis not present

## 2020-12-05 DIAGNOSIS — R112 Nausea with vomiting, unspecified: Secondary | ICD-10-CM | POA: Diagnosis not present

## 2020-12-05 DIAGNOSIS — I10 Essential (primary) hypertension: Secondary | ICD-10-CM | POA: Diagnosis not present

## 2020-12-05 MED ORDER — BENZONATATE 100 MG PO CAPS: 100.0000 mg | ORAL_CAPSULE | Freq: Three times a day (TID) | ORAL | 0 refills | Status: DC | PRN

## 2020-12-05 MED ORDER — ENALAPRIL MALEATE 5 MG PO TABS: 5.0000 mg | ORAL_TABLET | Freq: Every day | ORAL | 3 refills | Status: DC

## 2020-12-05 MED ORDER — ONDANSETRON 4 MG PO TBDP
4.0000 mg | ORAL_TABLET | Freq: Three times a day (TID) | ORAL | 0 refills | Status: DC | PRN
Start: 2020-12-05 — End: 2021-02-23

## 2020-12-05 MED ORDER — LEVOFLOXACIN 500 MG PO TABS: 500.0000 mg | ORAL_TABLET | Freq: Every day | ORAL | 0 refills | Status: DC

## 2020-12-05 MED ORDER — PREDNISONE 20 MG PO TABS
ORAL_TABLET | ORAL | 0 refills | Status: DC
Start: 2020-12-05 — End: 2020-12-20

## 2020-12-05 NOTE — Progress Notes (Signed)
Subjective:    Patient ID: Randy [name], male    DOB: 12/16/1934, 86 y.o.   MRN: [id number]  Randy [name] is a 86 y.o. male presenting on 12/05/2020 for Vomiting / coughing up mucus after eathing (For 12 days/) and Cough (And congestion for 3 days)   HPI  Acute Sinusitis Reports onset 2 weeks with nausea vomiting worse after eating, did not vomit up food just mucus. Then worsening now in past 3+ days with cough and congestion. He was exposed to influenza >1 week ago. Post tussive emesis with coughing. Denies any fevers, chills  HTN Needs refill on Enalapril dose 5mg , was requested at 10mg  but declined.  Health Maintenance: Due for Flu Shot  Depression screen [facility] 2/9 07/25/2020 03/02/2019 01/22/2019  Decreased Interest 0 0 0  Down, Depressed, Hopeless 0 0 0  PHQ - 2 Score 0 0 0  Some recent data might be hidden    Social History   Tobacco Use   Smoking status: Former    Packs/day: 1.00    Years: 12.00    Pack years: 12.00    Types: Cigarettes    Quit date: 1968    Years since quitting: 54.9   Smokeless tobacco: Never  Vaping Use   Vaping Use: Never used  Substance Use Topics   Alcohol use: No   Drug use: No    Review of Systems Per HPI unless specifically indicated above     Objective:    BP 130/80 (BP Location: Right Arm, Patient Position: Sitting, Cuff Size: Normal)   Pulse 74   Resp 15   Ht 6' (1.829 m)   Wt 211 lb 9.6 oz (96 kg)   SpO2 95%   BMI 28.70 kg/m   Wt Readings from Last 3 Encounters:  12/05/20 211 lb 9.6 oz (96 kg)  10/20/20 218 lb 0.6 oz (98.9 kg)  10/16/20 218 lb (98.9 kg)    Physical Exam Vitals and nursing note reviewed.  Constitutional:      General: He is not in acute distress.    Appearance: He is well-developed. He is not diaphoretic.     Comments: Well-appearing, comfortable, cooperative  HENT:     Head: Normocephalic and atraumatic.  Eyes:     General:        Right eye: No discharge.        Left eye: No discharge.      Conjunctiva/sclera: Conjunctivae normal.  Neck:     Thyroid: No thyromegaly.  Cardiovascular:     Rate and Rhythm: Normal rate and regular rhythm.     Pulses: Normal pulses.     Heart sounds: Normal heart sounds. No murmur heard. Pulmonary:     Effort: Pulmonary effort is normal. No respiratory distress.     Breath sounds: Normal breath sounds. No wheezing or rales.  Musculoskeletal:        General: Normal range of motion.     Cervical back: Normal range of motion and neck supple.  Lymphadenopathy:     Cervical: No cervical adenopathy.  Skin:    General: Skin is warm and dry.     Findings: No erythema or rash.  Neurological:     Mental Status: He is alert and oriented to person, place, and time. Mental status is at baseline.  Psychiatric:        Behavior: Behavior normal.     Comments: Well groomed, good eye contact, normal speech and thoughts     Results for  orders placed or performed in visit on 10/23/20  BLADDER SCAN AMB NON-IMAGING  Result Value Ref Range   Scan Result 65mL       Assessment & Plan:   Problem List Items Addressed This Visit     Essential hypertension   Relevant Medications   enalapril (VASOTEC) 5 MG tablet   Other Visit Diagnoses     Acute non-recurrent frontal sinusitis    -  Primary   Relevant Medications   levofloxacin (LEVAQUIN) 500 MG tablet   predniSONE (DELTASONE) 20 MG tablet   benzonatate (TESSALON) 100 MG capsule   Nausea and vomiting, unspecified vomiting type       Relevant Medications   ondansetron (ZOFRAN-ODT) 4 MG disintegrating tablet       Consistent with subacute frontal sinusitis, likely initially viral URI vs allergic rhinitis component with worsening concern for bacterial infection.   Questionable if influenza exposure and symptoms, already had onset symptoms with sinuses.  Afebrile currently  Seems vomit/cough is related to PO intake and episodic scenario worse in PM or AM.  Plan: Start taking Levaquin antibiotic  500mg  daily x 7 days Prednisone taper Start Tessalon Perls take 1 capsule up to 3 times a day as needed for cough Zofran ODT PRN Return criteria reviewed  HTN Will re order Enalapril 5mg  daily, chart had 10mg  listed and 2.5mg  - 5mg  is the correct dose   Meds ordered this encounter  Medications   levofloxacin (LEVAQUIN) 500 MG tablet    Sig: Take 1 tablet (500 mg total) by mouth daily. For 7 days    Dispense:  7 tablet    Refill:  0   predniSONE (DELTASONE) 20 MG tablet    Sig: Take daily with food. Start with 60mg  (3 pills) x 2 days, then reduce to 40mg  (2 pills) x 2 days, then 20mg  (1 pill) x 3 days    Dispense:  13 tablet    Refill:  0   ondansetron (ZOFRAN-ODT) 4 MG disintegrating tablet    Sig: Take 1 tablet (4 mg total) by mouth every 8 (eight) hours as needed for nausea or vomiting.    Dispense:  30 tablet    Refill:  0   benzonatate (TESSALON) 100 MG capsule    Sig: Take 1 capsule (100 mg total) by mouth 3 (three) times daily as needed for cough.    Dispense:  30 capsule    Refill:  0   enalapril (VASOTEC) 5 MG tablet    Sig: Take 1 tablet (5 mg total) by mouth daily.    Dispense:  90 tablet    Refill:  3     Follow up plan: Return if symptoms worsen or fail to improve.   Randy [name], [facility] 12/05/2020, 8:53 AM

## 2020-12-05 NOTE — Patient Instructions (Addendum)
Thank you for coming to the office today.  Start taking Levaquin antibiotic 500mg  daily x 7 days Start Prednisone taper for cough and breathing Zofran for nausea Start Tessalon Perls take 1 capsule up to 3 times a day as needed for cough  Refilled Enalapril 5mg  daily  If not improving by 7-10 day we can consider Chest X-ray  Please schedule a Follow-up Appointment to: Return if symptoms worsen or fail to improve.  If you have any other questions or concerns, please feel free to call the office or send a message through [place]. You may also schedule an earlier appointment if necessary.  Additionally, you may be receiving a survey about your experience at our office within a few days to 1 week by e-mail or mail. We value your feedback.  [name], DO [place]

## 2020-12-06 DIAGNOSIS — F0280 Dementia in other diseases classified elsewhere without behavioral disturbance: Secondary | ICD-10-CM

## 2020-12-06 DIAGNOSIS — N401 Enlarged prostate with lower urinary tract symptoms: Secondary | ICD-10-CM

## 2020-12-06 DIAGNOSIS — N138 Other obstructive and reflux uropathy: Secondary | ICD-10-CM | POA: Diagnosis not present

## 2020-12-06 DIAGNOSIS — E782 Mixed hyperlipidemia: Secondary | ICD-10-CM | POA: Diagnosis not present

## 2020-12-06 DIAGNOSIS — G3183 Dementia with Lewy bodies: Secondary | ICD-10-CM

## 2020-12-06 DIAGNOSIS — I10 Essential (primary) hypertension: Secondary | ICD-10-CM

## 2020-12-15 ENCOUNTER — Encounter (INDEPENDENT_AMBULATORY_CARE_PROVIDER_SITE_OTHER): Payer: Self-pay | Admitting: Vascular Surgery

## 2020-12-18 ENCOUNTER — Ambulatory Visit: Payer: Self-pay

## 2020-12-18 NOTE — Telephone Encounter (Signed)
  Chief Complaint: vomiting mucus Symptoms: N/V mucus  Frequency: several weeks Pertinent Negatives: Patient denies diarrhea Disposition: [] ED /[] Urgent Care (no appt availability in office) / [x] Appointment(In office/virtual)/ []  [place] [facility]/ [] Home Care/ [] Refused Recommended Disposition  Additional Notes: Pt daughter [name] calling in stating that pt still vomiting several times a day up mucus and today looks pale and stomach cramping. Pt was seen OV on 12/05/20 with same symptoms and haven't improved. Scheduled pt for 12/21/20 at 0940 with Regina,NP. Daughter is asking for sooner appt so spoke with Rachell, FC who was able to schedule appt for 12/19/20 at 1500 but daughter has meeting that day at that time and isn't able to bring in pt. Rescheduled pt back for 12/21/20 appt and advised her for symptoms to monitor for dehydration and if those occur to take pt to ED. She verbalized understanding.    Reason for Disposition  [1] MILD vomiting with diarrhea AND [2] present > 5 days  Answer Assessment - Initial Assessment Questions 1. VOMITING SEVERITY: "How many times have you vomited in the past 24 hours?"     - MILD:  1 - 2 times/day    - MODERATE: 3 - 5 times/day, decreased oral intake without significant weight loss or symptoms of dehydration    - SEVERE: 6 or more times/day, vomits everything or nearly everything, with significant weight loss, symptoms of dehydration      Mild to moderate 2. ONSET: "When did the vomiting begin?"      Several weeks  3. FLUIDS: "What fluids or food have you vomited up today?" "Have you been able to keep any fluids down?"     For the most part 4. ABDOMINAL PAIN: "Are your having any abdominal pain?" If yes : "How bad is it and what does it feel like?" (e.g., crampy, dull, intermittent, constant)      Yes cramps 5. DIARRHEA: "Is there any diarrhea?" If Yes, ask: "How many times today?"      No 6. CONTACTS: "Is there anyone else in the family  with the same symptoms?"      No 7. CAUSE: "What do you think is causing your vomiting?"     unsure 8. HYDRATION STATUS: "Any signs of dehydration?" (e.g., dry mouth [not only dry lips], too weak to stand) "When did you last urinate?"     Pale,  9. OTHER SYMPTOMS: "Do you have any other symptoms?" (e.g., fever, headache, vertigo, vomiting blood or coffee grounds, recent head injury)     No  Protocols used: Vomiting-A-AH

## 2020-12-19 ENCOUNTER — Ambulatory Visit: Payer: Medicare Other | Admitting: Family Medicine

## 2020-12-20 ENCOUNTER — Ambulatory Visit (INDEPENDENT_AMBULATORY_CARE_PROVIDER_SITE_OTHER): Payer: Medicare Other | Admitting: Urology

## 2020-12-20 ENCOUNTER — Other Ambulatory Visit: Payer: Self-pay

## 2020-12-20 ENCOUNTER — Ambulatory Visit: Payer: Medicare Other | Admitting: Urology

## 2020-12-20 ENCOUNTER — Encounter: Payer: Self-pay | Admitting: Urology

## 2020-12-20 VITALS — BP 122/74 | HR 69

## 2020-12-20 DIAGNOSIS — C61 Malignant neoplasm of prostate: Secondary | ICD-10-CM | POA: Diagnosis not present

## 2020-12-20 DIAGNOSIS — N138 Other obstructive and reflux uropathy: Secondary | ICD-10-CM

## 2020-12-20 DIAGNOSIS — N390 Urinary tract infection, site not specified: Secondary | ICD-10-CM | POA: Diagnosis not present

## 2020-12-20 DIAGNOSIS — N401 Enlarged prostate with lower urinary tract symptoms: Secondary | ICD-10-CM

## 2020-12-20 LAB — BLADDER SCAN AMB NON-IMAGING

## 2020-12-20 NOTE — Progress Notes (Signed)
° °  12/20/2020 11:29 AM   Randy [name] 06/08/1934 [id number]  Reason for visit: Follow up history of urinary retention, recurrent UTIs, dysuria, prostate cancer  HPI: 86 year old male with Lewy body dementia who presented to me in fall 2020 with Foley dependent urinary retention, and ultimately underwent a HOLEP with removal of 33 g of tissue showing <5% involvement with prostate adenocarcinoma Gleason score 4+3=7.  PSA preop was 2.7, and postop was 0.5, then 0.6 most recently in June 2022.  He had significant bleeding at the time of surgery, and only the right lateral lobe and median lobes were removed.  He had been doing well with low PVRs and had resumed spontaneous voiding, however developed an Enterococcus UTI in April 2022 followed by an additional UTI a few months later.  He also was having some urinary symptoms of dysuria despite appropriate antibiotics.  I recommended a cystoscopy, and in October 2022 he was found to have a 3 cm bladder stone and some residual obstructive appearing left lateral lobe tissue.  He underwent uncomplicated cystolitholapaxy and HOLEP of the remaining left lateral lobe on 10/20/2020 with removal of 23 g of benign tissue.  He is doing very well since surgery and denies any incontinence, dysuria, or other urinary symptoms, and is voiding with a strong stream.  PVR is normal at 0 mL.  We discussed the need to continue to follow the PSA with his history of intermediate risk prostate cancer at time of original HOLEP in 2020, however would be very unlikely that he would require any intervention for this in the future based on his age and comorbidities.  RTC 1 year PSA and PVR    [name], MD  [facility] [address] [place], [place] [address] [phone]

## 2020-12-21 ENCOUNTER — Ambulatory Visit: Payer: Medicare Other | Admitting: Internal Medicine

## 2020-12-21 ENCOUNTER — Encounter: Payer: Self-pay | Admitting: Internal Medicine

## 2020-12-21 VITALS — BP 111/73 | HR 71 | Temp 97.5°F | Resp 17 | Ht 72.0 in | Wt 206.0 lb

## 2020-12-21 DIAGNOSIS — R112 Nausea with vomiting, unspecified: Secondary | ICD-10-CM | POA: Diagnosis not present

## 2020-12-21 DIAGNOSIS — Z23 Encounter for immunization: Secondary | ICD-10-CM

## 2020-12-21 DIAGNOSIS — R630 Anorexia: Secondary | ICD-10-CM | POA: Diagnosis not present

## 2020-12-21 DIAGNOSIS — R634 Abnormal weight loss: Secondary | ICD-10-CM | POA: Diagnosis not present

## 2020-12-21 NOTE — Addendum Note (Signed)
Addended by: Wilson Singer on: 12/21/2020 10:53 AM   Modules accepted: Orders

## 2020-12-21 NOTE — Progress Notes (Signed)
Subjective:    Patient ID: Randy [name], male    DOB: Apr 22, 1934, 86 y.o.   MRN: [id number]  HPI  Patient presents with clinic today with complaint of poor appetite, nausea, vomiting vomiting and weight loss.  This started 3 to 4 weeks ago.  He reports the nausea and vomiting is intermittent.  He vomits clear mucus/saliva.  He has lost 12 pounds in the last 2 months.  He reports intermittent constipation but denies abdominal pain, bloating or blood in his stool.  He was seen for similar symptoms 11/29 by his PCP.  He was treated for a sinus infection with antibiotics, prednisone and cough suppressants.  He reports his sinus symptoms have improved however he continues with the nausea, vomiting and poor appetite.  He denies recent changes in diet.  His daughter reports his rivastigmine was increased from 1.5 mg to 3 mg at the beginning of November but otherwise has not had any medication changes.  He has been taking Zofran with minimal relief of symptoms.  Review of Systems     Past Medical History:  Diagnosis Date   Arthritis    GERD (gastroesophageal reflux disease)    Hyperlipidemia    Hypertension    Lewy body dementia (HCC)    Osteoporosis    Prostate enlargement     Current Outpatient Medications  Medication Sig Dispense Refill   acetaminophen (TYLENOL) 500 MG tablet Take 1,000 mg by mouth every 6 (six) hours as needed for moderate pain.     benzonatate (TESSALON) 100 MG capsule Take 1 capsule (100 mg total) by mouth 3 (three) times daily as needed for cough. (Patient taking differently: Take 100 mg by mouth as needed for cough.) 30 capsule 0   carbidopa-levodopa (SINEMET CR) 50-200 MG tablet Take 1 tablet by mouth 4 (four) times daily.     cholecalciferol (VITAMIN D) 1000 units tablet Take 1,000 Units by mouth daily.     Coenzyme Q10 (CO Q 10) 100 MG CAPS Take 100 mg by mouth daily.      diclofenac Sodium (VOLTAREN) 1 % GEL APPLY 2 G TOPICALLY 4 (FOUR) TIMES DAILY AS NEEDED. TO  KNEE ARTHRITIS PAIN (Patient not taking: Reported on 11/24/2020) 100 g 3   enalapril (VASOTEC) 5 MG tablet Take 1 tablet (5 mg total) by mouth daily. 90 tablet 3   fluticasone (FLONASE) 50 MCG/ACT nasal spray Place 2 sprays into both nostrils as needed for allergies.     furosemide (LASIX) 20 MG tablet Take 1 tablet (20 mg total) by mouth every other day. 45 tablet 2   Multiple Vitamin (MULTIVITAMIN WITH MINERALS) TABS tablet Take 1 tablet by mouth daily. 30 tablet 0   Omega-3 Fatty Acids (FISH OIL) 1200 MG CAPS Take 1,200 mg by mouth in the morning, at noon, in the evening, and at bedtime.     omeprazole (PRILOSEC) 20 MG capsule Take 20 mg by mouth daily.     ondansetron (ZOFRAN-ODT) 4 MG disintegrating tablet Take 1 tablet (4 mg total) by mouth every 8 (eight) hours as needed for nausea or vomiting. 30 tablet 0   rivastigmine (EXELON) 3 MG capsule Take 3 mg by mouth 2 (two) times daily.     sertraline (ZOLOFT) 50 MG tablet Take 50 mg by mouth at bedtime.     traMADol (ULTRAM) 50 MG tablet Take 1 tablet (50 mg total) by mouth every 8 (eight) hours as needed. 30 tablet 2   vitamin C (ASCORBIC ACID) 500 MG  tablet Take 500 mg by mouth daily.     zinc sulfate 220 (50 Zn) MG capsule Take 1 capsule (220 mg total) by mouth daily. 30 capsule 0   No current facility-administered medications for this visit.    Allergies  Allergen Reactions   Antihistamines, Chlorpheniramine-Type Other (See Comments)    Can't urinate    Family History  Problem Relation Age of Onset   Breast cancer Mother    Lung cancer Father    Heart disease Brother    Leukemia Brother    Prostate cancer Neg Hx    Colon cancer Neg Hx    Bladder Cancer Neg Hx    Kidney cancer Neg Hx     Social History   Socioeconomic History   Marital status: Married    Spouse name: Not on file   Number of children: Not on file   Years of education: College   Highest education level: Not on file  Occupational History   Occupation:  Retired Clinical biochemist  Tobacco Use   Smoking status: Former    Packs/day: 1.00    Years: 12.00    Pack years: 12.00    Types: Cigarettes    Quit date: 1968    Years since quitting: 54.9   Smokeless tobacco: Never  Vaping Use   Vaping Use: Never used  Substance and Sexual Activity   Alcohol use: No   Drug use: No   Sexual activity: Not Currently  Other Topics Concern   Not on file  Social History Narrative   Not on file   Social Determinants of Health   Financial Resource Strain: Low Risk    Difficulty of Paying Living Expenses: Not hard at all  Food Insecurity: No Food Insecurity   Worried About Charity fundraiser in the Last Year: Never true   Arboriculturist in the Last Year: Never true  Transportation Needs: No Transportation Needs   Lack of Transportation (Medical): No   Lack of Transportation (Non-Medical): No  Physical Activity: Inactive   Days of Exercise per Week: 0 days   Minutes of Exercise per Session: 0 min  Stress: No Stress Concern Present   Feeling of Stress : Not at all  Social Connections: Socially Integrated   Frequency of Communication with Friends and Family: More than three times a week   Frequency of Social Gatherings with Friends and Family: More than three times a week   Attends Religious Services: More than 4 times per year   Active Member of [employer] or Organizations: Yes   Attends Music therapist: More than 4 times per year   Marital Status: Married  Human resources officer Violence: Not At Risk   Fear of Current or Ex-Partner: No   Emotionally Abused: No   Physically Abused: No   Sexually Abused: No     Constitutional: Patient reports weight loss.  Denies fever, malaise, fatigue, headache.  HEENT: Denies eye pain, eye redness, ear pain, ringing in the ears, wax buildup, runny nose, nasal congestion, bloody nose, or sore throat. Respiratory: Denies difficulty breathing, shortness of breath, cough or sputum production.    Cardiovascular: Denies chest pain, chest tightness, palpitations or swelling in the hands or feet.  Gastrointestinal: Patient reports poor appetite, nausea, vomiting and intermittent constipation.  Denies abdominal pain, bloating, diarrhea or blood in the stool.  Skin: Denies redness, rashes, lesions or ulcercations.  Psych: Denies anxiety, depression, SI/HI.  No other specific complaints in a complete review of systems (  except as listed in HPI above).  Objective:   Physical Exam  BP 111/73 (BP Location: Right Arm, Patient Position: Sitting, Cuff Size: Normal)    Pulse 71    Temp (!) 97.5 F (36.4 C) (Temporal)    Resp 17    Ht 6' (1.829 m)    Wt 206 lb 0.3 oz (93.5 kg)    SpO2 99%    BMI 27.94 kg/m   Wt Readings from Last 3 Encounters:  12/05/20 211 lb 9.6 oz (96 kg)  10/20/20 218 lb 0.6 oz (98.9 kg)  10/16/20 218 lb (98.9 kg)    General: Appears his stated age, overweight, in NAD. Skin: Warm, dry and intact. No rashes noted. Cardiovascular: Normal rate and rhythm. Pulmonary/Chest: Normal effort and positive vesicular breath sounds. No respiratory distress. No wheezes, rales or ronchi noted.  Abdomen: Soft and nontender. Normal bowel sounds. No distention or masses noted.  Musculoskeletal: Gait slow and steady with use of rolling walker. Neurological: Alert and oriented.   BMET    Component Value Date/Time   NA 141 10/17/2020 1054   NA 143 04/17/2015 0826   K 3.8 10/17/2020 1054   K 4.0 04/17/2015 0826   CL 104 10/17/2020 1054   CL 102 04/17/2015 0826   CO2 30 10/17/2020 1054   CO2 25 11/21/2014 0905   GLUCOSE 77 10/17/2020 1054   BUN 21 10/17/2020 1054   BUN 18 04/17/2015 0826   CREATININE 0.90 10/17/2020 1054   CREATININE 1.10 04/02/2019 0925   CALCIUM 8.5 (L) 10/17/2020 1054   CALCIUM 8.5 (A) 04/17/2015 0826   GFRNONAA >60 10/17/2020 1054   GFRNONAA 62 04/02/2019 0925   GFRAA >60 10/12/2019 0416   GFRAA 72 04/02/2019 0925    Lipid Panel     Component Value  Date/Time   CHOL 156 07/17/2020 0851   CHOL 151 04/17/2015 0826   TRIG 71 07/17/2020 0851   TRIG 116 04/17/2015 0826   HDL 37 (L) 07/17/2020 0851   HDL 32 (A) 04/17/2015 0826   CHOLHDL 4.2 07/17/2020 0851   VLDL 14 07/17/2020 0851   LDLCALC 105 (H) 07/17/2020 0851   LDLCALC 121 (H) 06/10/2018 0800    CBC    Component Value Date/Time   WBC 5.3 10/17/2020 1054   RBC 3.77 (L) 10/17/2020 1054   HGB 12.3 (L) 10/17/2020 1054   HCT 35.1 (L) 10/17/2020 1054   HCT 42 04/17/2015 0826   PLT 182 10/17/2020 1054   PLT 188 04/17/2015 0826   MCV 93.1 10/17/2020 1054   MCV 90 04/17/2015 0826   MCH 32.6 10/17/2020 1054   MCHC 35.0 10/17/2020 1054   RDW 13.5 10/17/2020 1054   RDW 14.7 04/17/2015 0826   LYMPHSABS 0.8 09/08/2018 1833   MONOABS 0.8 09/08/2018 1833   EOSABS 0.0 09/08/2018 1833   BASOSABS 0.0 09/08/2018 1833    Hgb A1C Lab Results  Component Value Date   HGBA1C 5.4 06/10/2018            Assessment & Plan:   Poor Appetite, Nausea, Vomiting and Weight Loss:  I feel like his symptoms are side effects from increased dose of Rivastigmine Will increase Omeprazole to 40 mg daily to try to mitigate symptoms Continue Zofran as needed If no improvement in symptoms, would notify neurology to see if they would like to adjust his Rivastigmine Flu shot today  Return precautions discussed  Webb Silversmith, NP This visit occurred during the SARS-CoV-2 public health emergency.  Safety protocols were  in place, including screening questions prior to the visit, additional usage of staff PPE, and extensive cleaning of exam room while observing appropriate contact time as indicated for disinfecting solutions.

## 2020-12-21 NOTE — Patient Instructions (Signed)
Vomiting, Adult Vomiting is when stomach contents forcefully come out of the mouth. Many people notice nausea before vomiting. Vomiting can make you feel weak and cause you to become dehydrated. Dehydration can make you feel tired and thirsty, cause you to have a dry mouth, and decrease how often you urinate. Older adults and people who have other diseases or a weak body defense system (immune system) are at higher risk for dehydration. It is important to treat vomiting as told by your health care provider. Follow these instructions at home: Watch your symptoms for any changes. Tell your health care provider about them. Eating and drinking   Follow these recommendations as told by your health care provider: Take an oral rehydration solution (ORS). This is a drink that is sold at pharmacies and retail stores. Eat bland, easy-to-digest foods in small amounts as you are able. These foods include bananas, applesauce, rice, lean meats, toast, and crackers. Drink clear fluids slowly and in small amounts as you are able. Clear fluids include water, ice chips, low-calorie sports drinks, and fruit juice that has water added (diluted fruit juice). Avoid drinking fluids that contain a lot of sugar or caffeine, such as energy drinks, sports drinks, and soda. Avoid alcohol. Avoid spicy or fatty foods.  General instructions Wash your hands often using soap and water for at least 20 seconds. If soap and water are not available, use hand sanitizer. Make sure that everyone in your household washes their hands frequently. Take over-the-counter and prescription medicines only as told by your health care provider. Rest at home while you recover. Watch your condition for any changes. Keep all follow-up visits. This is important. Contact a health care provider if: Your vomiting gets worse. You have new symptoms. You have a fever. You cannot drink fluids without vomiting. You feel light-headed or dizzy. You  have a headache. You have muscle cramps. You have a rash. You have pain while urinating. Get help right away if: You have pain in your chest, neck, arm, or jaw. Your heart is beating very quickly. You have trouble breathing or you are breathing very quickly. You feel extremely weak or you faint. Your skin feels cold and clammy. You feel confused. You have persistent vomiting. You have vomit that is bright red or looks like black coffee grounds. You have stools (feces) that are bloody or black, or stools that look like tar. You have a severe headache, a stiff neck, or both. You have severe pain, cramping, or bloating in your abdomen. You have signs of dehydration, such as: Dark urine, very little urine, or no urine. Cracked lips. Dry mouth. Sunken eyes. Sleepiness. Weakness. These symptoms may be an emergency. Get help right away. Call 911. Do not wait to see if the symptoms will go away. Do not drive yourself to the hospital. Summary Vomiting is when stomach contents forcefully come out of the mouth. Vomiting can cause you to become dehydrated. It is important to treat vomiting as told by your health care provider. Follow your health care provider's instructions about eating and drinking. Wash your hands often using soap and water for at least 20 seconds. If soap and water are not available, use hand sanitizer. Watch your condition for any changes and for signs of dehydration. Keep all follow-up visits. This is important. This information is not intended to replace advice given to you by your health care provider. Make sure you discuss any questions you have with your health care provider. Document Revised: 06/30/2020  Document Reviewed: 06/30/2020 Elsevier Patient Education  [place].

## 2020-12-26 ENCOUNTER — Ambulatory Visit (INDEPENDENT_AMBULATORY_CARE_PROVIDER_SITE_OTHER): Payer: Medicare Other | Admitting: Vascular Surgery

## 2020-12-26 ENCOUNTER — Encounter (INDEPENDENT_AMBULATORY_CARE_PROVIDER_SITE_OTHER): Payer: Self-pay | Admitting: Vascular Surgery

## 2020-12-26 ENCOUNTER — Other Ambulatory Visit: Payer: Self-pay

## 2020-12-26 VITALS — BP 152/82 | HR 76 | Ht 72.0 in | Wt 208.0 lb

## 2020-12-26 DIAGNOSIS — M7989 Other specified soft tissue disorders: Secondary | ICD-10-CM | POA: Diagnosis not present

## 2020-12-26 DIAGNOSIS — E782 Mixed hyperlipidemia: Secondary | ICD-10-CM | POA: Diagnosis not present

## 2020-12-26 DIAGNOSIS — I10 Essential (primary) hypertension: Secondary | ICD-10-CM | POA: Diagnosis not present

## 2020-12-26 DIAGNOSIS — I872 Venous insufficiency (chronic) (peripheral): Secondary | ICD-10-CM

## 2020-12-26 NOTE — Assessment & Plan Note (Signed)
Has not been checked in several years and his symptoms have significantly worsened.  We will get a venous reflux study in the near future at his convenience.  Urged beginning back more of his conservative therapies as much as tolerated as well.

## 2020-12-26 NOTE — Assessment & Plan Note (Signed)
His swelling has gotten much worse.  He had previous known significant venous insufficiency several years ago but at that time, he was much more active and conservative measures were controlling the swelling and he did not need any intervention.  He will need a new venous study as it has been years since this was checked, and we need to try to get back into the conservative measures better as well.  This would include some sort of compression, elevation, and trying to increase activity.  We will see him back after his venous reflux study.  He is also likely benefit from a lymphedema pump as he has clearly developed at least stage II lymphedema at this point.

## 2020-12-26 NOTE — Progress Notes (Signed)
Patient ID: Randy [name], male   DOB: 10-16-1934, 86 y.o.   MRN: [id number]  Chief Complaint  Patient presents with   New Patient (Initial Visit)    NP last seen 11/2016 with JD. Chronic venous insuff. Consult . Referred by abor- Andres Labrum    HPI Randy [name] is a 86 y.o. male.  I am asked to see the patient by Dr. Garen Lah for evaluation of venous insufficiency and leg swelling.  The patient was last seen over 4 years ago for similar issues, but compression socks and elevation were significantly controlling his swelling and markedly reduced his symptoms at that time.  He did have known venous reflux both in the deep venous system as well as the superficial system at that time, but with good results with conservative therapy we did not perform intervention.  Over the past 4 years, he has become much less active and is now unable to get his compression socks on and off.  He denies any open wounds or infection.  No fevers or chills.  He is getting much more skin discoloration per tickly on the right leg.  Both legs are swelling more.  No open ulcerations or wounds, although the skin is getting somewhat dry and cracked at this point on the right leg.  This is also become heavy and more difficult for him to get around.   Past Medical History:  Diagnosis Date   Arthritis    GERD (gastroesophageal reflux disease)    Hyperlipidemia    Hypertension    Lewy body dementia ([place])    Osteoporosis    Prostate enlargement     Past Surgical History:  Procedure Laterality Date   CHOLECYSTECTOMY N/A 09/10/2018   Procedure: LAPAROSCOPIC CHOLECYSTECTOMY;  Surgeon: Benjamine Sprague, DO;  Location: [facility];  Service: General;  Laterality: N/A;   CYSTOSCOPY WITH LITHOLAPAXY N/A 10/20/2020   Procedure: CYSTOSCOPY WITH LITHOLAPAXY;  Surgeon: [name], MD;  Location: [facility];  Service: Urology;  Laterality: N/A;   GANGLION CYST EXCISION Left 2013   Left upper arm   HERNIA REPAIR N/A 2008    MIdline, ventral acquired hernia repair   HOLEP-LASER ENUCLEATION OF THE PROSTATE WITH MORCELLATION N/A 11/06/2018   Procedure: HOLEP-LASER ENUCLEATION OF THE PROSTATE WITH MORCELLATION;  Surgeon: [name], MD;  Location: [facility];  Service: Urology;  Laterality: N/A;   HOLEP-LASER ENUCLEATION OF THE PROSTATE WITH MORCELLATION N/A 10/20/2020   Procedure: HOLEP-LASER ENUCLEATION OF THE PROSTATE WITH MORCELLATION;  Surgeon: [name], MD;  Location: [facility];  Service: Urology;  Laterality: N/A;     Family History  Problem Relation Age of Onset   Breast cancer Mother    Lung cancer Father    Heart disease Brother    Leukemia Brother    Prostate cancer Neg Hx    Colon cancer Neg Hx    Bladder Cancer Neg Hx    Kidney cancer Neg Hx       Social History   Tobacco Use   Smoking status: Former    Packs/day: 1.00    Years: 12.00    Pack years: 12.00    Types: Cigarettes    Quit date: 1968    Years since quitting: 55.0   Smokeless tobacco: Never  Vaping Use   Vaping Use: Never used  Substance Use Topics   Alcohol use: No   Drug use: No     Allergies  Allergen Reactions   Antihistamines, Chlorpheniramine-Type Other (  See Comments)    Can't urinate    Current Outpatient Medications  Medication Sig Dispense Refill   acetaminophen (TYLENOL) 500 MG tablet Take 1,000 mg by mouth every 6 (six) hours as needed for moderate [name].     benzonatate (TESSALON) 100 MG capsule Take 1 capsule (100 mg total) by mouth 3 (three) times daily as needed for cough. 30 capsule 0   carbidopa-levodopa (SINEMET CR) 50-200 MG tablet Take 1 tablet by mouth 4 (four) times daily.     cholecalciferol (VITAMIN D) 1000 units tablet Take 1,000 Units by mouth daily.     Coenzyme Q10 (CO Q 10) 100 MG CAPS Take 100 mg by mouth daily.      diclofenac Sodium (VOLTAREN) 1 % GEL APPLY 2 G TOPICALLY 4 (FOUR) TIMES DAILY AS NEEDED. TO KNEE ARTHRITIS [name] 100 g 3   enalapril (VASOTEC) 5 MG tablet Take  1 tablet (5 mg total) by mouth daily. 90 tablet 3   fluticasone (FLONASE) 50 MCG/ACT nasal spray Place 2 sprays into both nostrils as needed for allergies.     furosemide (LASIX) 20 MG tablet Take 1 tablet (20 mg total) by mouth every other day. 45 tablet 2   Multiple Vitamin (MULTIVITAMIN WITH MINERALS) TABS tablet Take 1 tablet by mouth daily. 30 tablet 0   Omega-3 Fatty Acids (FISH OIL) 1200 MG CAPS Take 1,200 mg by mouth in the morning, at noon, in the evening, and at bedtime.     omeprazole (PRILOSEC) 20 MG capsule Take 40 mg by mouth daily.     ondansetron (ZOFRAN-ODT) 4 MG disintegrating tablet Take 1 tablet (4 mg total) by mouth every 8 (eight) hours as needed for nausea or vomiting. 30 tablet 0   rivastigmine (EXELON) 3 MG capsule Take 3 mg by mouth 2 (two) times daily.     sertraline (ZOLOFT) 50 MG tablet Take 50 mg by mouth at bedtime.     traMADol (ULTRAM) 50 MG tablet Take 1 tablet (50 mg total) by mouth every 8 (eight) hours as needed. 30 tablet 2   vitamin C (ASCORBIC ACID) 500 MG tablet Take 500 mg by mouth daily.     zinc sulfate 220 (50 Zn) MG capsule Take 1 capsule (220 mg total) by mouth daily. 30 capsule 0   No current facility-administered medications for this visit.     REVIEW OF SYSTEMS (Negative unless checked)   Constitutional: [] Weight loss  [] Fever  [] Chills Cardiac: [] Chest [name]   [] Chest pressure   [] Palpitations   [] Shortness of breath when laying flat   [] Shortness of breath at rest   [] Shortness of breath with exertion. Vascular:  [x] [name] in legs with walking   [] [name] in legs at rest   [] [name] in legs when laying flat   [] Claudication   [] [name] in feet when walking  [] [name] in feet at rest  [] [name] in feet when laying flat   [] History of DVT   [] Phlebitis   [x] Swelling in legs   [x] Varicose veins   [] Non-healing ulcers Pulmonary:   [] Uses home oxygen   [] Productive cough   [] Hemoptysis   [] Wheeze  [] COPD   [] Asthma Neurologic:  [] Dizziness  [] Blackouts   [] Seizures    [] History of stroke   [] History of TIA  [] Aphasia   [] Temporary blindness   [] Dysphagia   [] Weakness or numbness in arms   [] Weakness or numbness in legs Musculoskeletal:  [x] Arthritis   [] Joint swelling   [] Joint [name]   [] Low back [name] Hematologic:  []   Easy bruising  [] Easy bleeding   [] Hypercoagulable state   [] Anemic  [] Hepatitis Gastrointestinal:  [] Blood in stool   [] Vomiting blood  [] Gastroesophageal reflux/heartburn   [] Abdominal [name] Genitourinary:  [] Chronic kidney disease   [] Difficult urination  [] Frequent urination  [] Burning with urination   [] Hematuria Skin:  [] Rashes   [] Ulcers   [] Wounds Psychological:  [] History of anxiety   []  History of major depression.    Physical Exam BP (!) 152/82    Pulse 76    Ht 6' (1.829 m)    Wt 208 lb (94.3 kg)    BMI 28.21 kg/m  Gen:  WD/WN, NAD Head: [place]/AT, No temporalis wasting.  Ear/Nose/Throat: Hearing grossly intact, nares w/o erythema or drainage, oropharynx w/o Erythema/Exudate Eyes: Conjunctiva clear, sclera non-icteric  Neck: trachea midline.  No JVD.  Pulmonary:  Good air movement, respirations not labored, no use of accessory muscles  Cardiac: RRR, no JVD Vascular:  Vessel Right Left  Radial Palpable Palpable                                   Gastrointestinal:. No masses, surgical incisions, or scars. Musculoskeletal: M/S 5/5 throughout.  Extremities without ischemic changes.  No deformity or atrophy.  Marked stasis dermatitis changes present on the right with moderate stasis dermatitis changes present on the left. 2-3+ RLE edema, 1-2+ LLE edema. Neurologic: Sensation grossly intact in extremities.  Symmetrical.  Speech is fluent. Motor exam as listed above. Psychiatric: Judgment intact, Mood & affect appropriate for pt's clinical situation. Dermatologic: No rashes or ulcers noted.  No cellulitis or open wounds.    Radiology No results found.  Labs Recent Results (from the past 2160 hour(s))  Urinalysis, Complete  w Microscopic     Status: Abnormal   Collection Time: 10/10/20  1:02 PM  Result Value Ref Range   Color, Urine YELLOW YELLOW   APPearance CLEAR CLEAR   Specific Gravity, Urine 1.020 1.005 - 1.030   pH 6.0 5.0 - 8.0   Glucose, UA NEGATIVE NEGATIVE mg/dL   Hgb urine dipstick NEGATIVE NEGATIVE   Bilirubin Urine NEGATIVE NEGATIVE   Ketones, ur NEGATIVE NEGATIVE mg/dL   Protein, ur NEGATIVE NEGATIVE mg/dL   Nitrite NEGATIVE NEGATIVE   Leukocytes,Ua SMALL (A) NEGATIVE   Squamous Epithelial / LPF 0-5 0 - 5   WBC, UA 21-50 0 - 5 WBC/hpf   RBC / HPF 0-5 0 - 5 RBC/hpf   Bacteria, UA FEW (A) NONE SEEN   WBC Clumps PRESENT     Comment: Performed at [facility], [address] [place], [place] [address]  Urine Culture     Status: None   Collection Time: 10/10/20  2:25 PM   Specimen: Urine, Random  Result Value Ref Range   Specimen Description      URINE, RANDOM Performed at [facility], [address], [place], [place] [address]    Special Requests      NONE Performed at [facility] [place], [address] [place], [place] [address]    Culture      NO GROWTH Performed at [facility], [place] [address] [place], [place] [address]    Report Status 10/11/2020 FINAL   CBC     Status: Abnormal   Collection Time: 10/17/20 10:54 AM  Result Value Ref Range   WBC 5.3 4.0 - 10.5 K/uL   RBC 3.77 (L) 4.22 -  5.81 MIL/uL   Hemoglobin 12.3 (L) 13.0 - 17.0 g/dL   HCT 35.1 (L) 39.0 - 52.0 %   MCV 93.1 80.0 - 100.0 fL   MCH 32.6 26.0 - 34.0 pg   MCHC 35.0 30.0 - 36.0 g/dL   RDW 13.5 11.5 - 15.5 %   Platelets 182 150 - 400 K/uL   nRBC 0.0 0.0 - 0.2 %    Comment: Performed at [facility], [address], [place], [place] [address]  Basic metabolic panel per protocol     Status: Abnormal   Collection Time: 10/17/20 10:54 AM  Result Value Ref Range   Sodium 141 135 - 145 mmol/L   Potassium 3.8 3.5 - 5.1 mmol/L   Chloride 104 98 - 111 mmol/L    CO2 30 22 - 32 mmol/L   Glucose, Bld 77 70 - 99 mg/dL    Comment: Glucose reference range applies only to samples taken after fasting for at least 8 hours.   BUN 21 8 - 23 mg/dL   Creatinine, Ser 0.90 0.61 - 1.24 mg/dL   Calcium 8.5 (L) 8.9 - 10.3 mg/dL   GFR, Estimated >60 >60 mL/min    Comment: (NOTE) Calculated using the CKD-EPI Creatinine Equation (2021)    Anion gap 7 5 - 15    Comment: Performed at [facility], [address] [place], [place] [address]  Surgical pathology     Status: None   Collection Time: 10/20/20  8:51 AM  Result Value Ref Range   SURGICAL PATHOLOGY      SURGICAL PATHOLOGY CASE: [id number] PATIENT: [name] Surgical Pathology Report     Specimen Submitted: A. Prostate chips  Clinical History: Bladder stone, BPH with obstruction.    DIAGNOSIS: A. PROSTATE GLAND; TRANSURETHRAL RESECTION: - BENIGN PROSTATIC TISSUE WITH NODULAR HYPERTROPHY (BENIGN PROSTATIC HYPERPLASIA). - NEGATIVE FOR MALIGNANCY.  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 Tissue is submitted in entirety for histologic evaluation. In addition, PIN 3 immunohistochemical cocktails were performed on multiple selected tissue blocks. These studies demonstrate an intact myoepithelial layer by p63 and HMWCK, and lack of aberrant luminal staining for AMACR, compatible with benign glands. There is no evidence of malignancy.  IHC slides were prepared by [facility] Pathology, [place], [place]. All controls stained appropriately.  This test was developed and its performance characteristics determined by [facility]. It has not been cleared or approved by the [place] Food and Drug Administration. The FDA does not require this test to go through premarket FDA review. This test is used for  clinical purposes. It should not be regarded as investigational or for research. This laboratory is certified under the Clinical Laboratory Improvement Amendments (CLIA) as qualified to perform high complexity clinical laboratory testing.  GROSS DESCRIPTION: A. Labeled: Prostate chips Received: Formalin Collection time: 8:51 AM on 10/20/2020 Placed into formalin time: 9:08 AM on 10/20/2020 Type of procedure: Enucleation of the prostate with morcellation Weight: 22.71 grams Size: 5.5 x 5.5 x 1.5 cm Description: Pink, rubbery tissue fragments, admixed with clotted blood Percentage of tissue submitted for microscopic review: Approximately 75%  Blo ck summary: 1 -8-representative sections  [facility] 10/20/2020  Final Diagnosis performed by Allena Napoleon, MD.   Electronically signed 10/24/2020 11:54:01AM The electronic signature indicates that the named Attending Pathologist has evaluated the specimen Technical component performed at [facility], [place]  [address] [place], [place] [address] Lab: [phone] Dir: Rush Farmer, MD, MMM  Professional component performed at [facility], [facility] [place], [place], [place] [address] Lab: [phone] Dir: Kathi Simpers, MD   BLADDER SCAN AMB NON-IMAGING     Status: None   Collection Time: 10/23/20  2:33 PM  Result Value Ref Range   Scan Result 3mL   Bladder Scan (Post Void Residual) in office     Status: None   Collection Time: 12/20/20 11:17 AM  Result Value Ref Range   Scan Result 76mL     Assessment/Plan: Hyperlipidemia lipid control important in reducing the progression of atherosclerotic disease. Intolerant to statins     Hypertension blood pressure control important in reducing the progression of atherosclerotic disease. On appropriate oral medications.  Swelling of limb His swelling has gotten much worse.  He had previous known significant venous insufficiency several years ago but at that time, he was much  more active and conservative measures were controlling the swelling and he did not need any intervention.  He will need a new venous study as it has been years since this was checked, and we need to try to get back into the conservative measures better as well.  This would include some sort of compression, elevation, and trying to increase activity.  We will see him back after his venous reflux study.  He is also likely benefit from a lymphedema pump as he has clearly developed at least stage II lymphedema at this point.  Chronic venous insufficiency Has not been checked in several years and his symptoms have significantly worsened.  We will get a venous reflux study in the near future at his convenience.  Urged beginning back more of his conservative therapies as much as tolerated as well.      Randy [name] 12/26/2020, 11:36 AM   This note was created with Dragon medical transcription system.  Any errors from dictation are unintentional.

## 2021-01-15 ENCOUNTER — Ambulatory Visit (INDEPENDENT_AMBULATORY_CARE_PROVIDER_SITE_OTHER): Payer: Medicare Other

## 2021-01-15 ENCOUNTER — Telehealth: Payer: Medicare Other

## 2021-01-15 DIAGNOSIS — N3941 Urge incontinence: Secondary | ICD-10-CM

## 2021-01-15 DIAGNOSIS — I10 Essential (primary) hypertension: Secondary | ICD-10-CM

## 2021-01-15 DIAGNOSIS — E782 Mixed hyperlipidemia: Secondary | ICD-10-CM

## 2021-01-15 DIAGNOSIS — R413 Other amnesia: Secondary | ICD-10-CM

## 2021-01-15 DIAGNOSIS — F0280 Dementia in other diseases classified elsewhere without behavioral disturbance: Secondary | ICD-10-CM

## 2021-01-15 DIAGNOSIS — I872 Venous insufficiency (chronic) (peripheral): Secondary | ICD-10-CM

## 2021-01-15 NOTE — Patient Instructions (Signed)
Visit Information  Thank you for taking time to visit with me today. Please don't hesitate to contact me if I can be of assistance to you before our next scheduled telephone appointment.  Following are the goals we discussed today:  RNCM Clinical Goal(s):  Patient will verbalize understanding of plan for management of HTN, HLD, Dementia, and Urinary Incontinence with recent bladder stone as evidenced by effective management of chronic condtions without any exacerbations and maintaining health and well being take all medications exactly as prescribed and will call provider for medication related questions as evidenced by compliance with medications and working with the pharm D for questions or concerns related to medications    attend all scheduled medical appointments: no upcoming with pcp but has with specialist  as evidenced by keeping appointments and calling the office for changes in condition or new concerns that need to be addressed         continue to work with [place] and/or Social Worker to address care management and care coordination needs related to HTN, HLD, Dementia, and Urinary incontinence  as evidenced by adherence to CM Team Scheduled appointments     demonstrate a decrease in Urinary incontinence with recent bladder stone, and changes in memory due to lewy body demential exacerbations  as evidenced by stable conditions, no acute exacerbations and working with the CCM team to manage health and well being demonstrate ongoing self health care management ability of effective management of chronic conditions as evidenced by  working with the CCM team  through collaboration with Consulting civil engineer, provider, and care team.    Interventions: 1:1 collaboration with primary care provider regarding development and update of comprehensive plan of care as evidenced by provider attestation and co-signature Inter-disciplinary care team collaboration (see longitudinal plan of  care) Evaluation of current treatment plan related to  self management and patient's adherence to plan as established by provider     SDOH Barriers (Status: Goal on Track (progressing): YES.) Long Term Goal  Patient interviewed and SDOH assessment performed        Patient interviewed and appropriate assessments performed Provided patient with information about resources in the county and care guides avaialble to assist with changes in SDOH or new needs as they arise Discussed plans with patient for ongoing care management follow up and provided patient with direct contact information for care management team Advised patient to call the office for changes in SDOH, Question, or Concerns Provided education to patient/caregiver regarding level of care options.       Urinary Incontinence with enlarged prostate and recent bladder stone  (Status: Goal on Track (progressing): YES. No Needs Identified this visit) Long Term Goal  Evaluation of current treatment plan related to  Urinary incontinence with enlarged prostate and recent bladder stone  , Level of care concerns and Memory Deficits self-management and patient's adherence to plan as established by provider. 11-13-2020: The patient recently had bladder stone removed on 10-20-2020. The patients daughter states that since removal of the bladder stone the patient denies any pain on urination and states he feels better. The daughter has taken the patient to follow ups with urologist. Review of sx and sx to monitor for changes in urinary health and well being. 01-15-2021: The patients daughter states that he is doing well since the procedure last year. He has not had anymore complaints or issues related to urinary retention. Will continue to monitor for changes.  Discussed plans with patient for ongoing care  management follow up and provided patient with direct contact information for care management team Advised patient to call the office for changes in  urinary health, questions or concerns; Provided education to patient re: monitoring for sx and sx of UTI, increased pain on urinarion, new concerns related to urinary incontinence. 01-15-2021: Review and the patient or daughter denies any issues related to urinary retention or concerns ; Reviewed medications with patient and discussed compliance, the wife and daughter assist with medications management. 01-15-2021: The daughter states compliance with medications  ; Reviewed scheduled/upcoming provider appointments including Decemember 2022, with Urology specialist  The patien thas no appointment with the pcp coming up but education provided to the daughter to call the office for changes. 01-15-2021: Follows up with the provider as needed. Denies any new concerns with urinary retention; Discussed plans with patient for ongoing care management follow up and provided patient with direct contact information for care management team; Advised patient to discuss new development of urinary sx and sx with provider; Screening for signs and symptoms of depression related to chronic disease state;  Assessed social determinant of health barriers;    Hyperlipidemia:  (Status: Goal on Track (progressing): YES.)      Lab Results  Component Value Date    CHOL 156 07/17/2020    HDL 37 (L) 07/17/2020    LDLCALC 105 (H) 07/17/2020    TRIG 71 07/17/2020    CHOLHDL 4.2 07/17/2020      Medication review performed; medication list updated in electronic medical record.  Provider established cholesterol goals reviewed; Counseled on importance of regular laboratory monitoring as prescribed; Provided HLD educational materials; Reviewed role and benefits of statin for ASCVD risk reduction; Discussed strategies to manage statin-induced myalgias; Reviewed importance of limiting foods high in cholesterol. 01-15-2021: The patients daughter states that the patient is not eating as well as he once was. She states partly because she  things he does not want to prepare it or he forgets. His wife makes sure he eats when she is up but her daughter is working a lot when he is up and doing daily activities. Education and support given.    Hypertension: (Status: Goal on Track (progressing): YES.) Last practice recorded BP readings:     BP Readings from Last 3 Encounters:  12/26/20 (!) 152/82  12/21/20 111/73  12/20/20 122/74  Most recent eGFR/CrCl: No results found for: EGFR  No components found for: CRCL   Evaluation of current treatment plan related to hypertension self management and patient's adherence to plan as established by provider. 11-13-2020: The patient has long standing issues with his blood pressures fluctuating. The patient does take his blood pressures at home. The patients daughter and wife assist with his care. States compliance with medications. Will continue to monitor. 01-15-2021: The patient is still having fluctuations in his blood pressures. The patient saw Dr. Lucky Cowboy recently for venous insufficiency and will have further testing on 01-19-2021: The patients daughter states his right leg is more swollen that the left. See new disease process under venous insufficiency.    Reviewed prescribed diet heart healthy diet. 01-15-2021: Review of heart healthy diet Reviewed medications with patient and discussed importance of compliance. 01-15-2021: Review of medications compliance and the patients daughter verbalized compliance ;  Discussed plans with patient for ongoing care management follow up and provided patient with direct contact information for care management team; Advised patient, providing education and rationale, to monitor blood pressure daily and record, calling PCP for findings outside  established parameters;  Advised patient to discuss blood pressure trends  with provider; Provided education on prescribed diet heart healthy diet ;  Discussed complications of poorly controlled blood pressure such as heart disease,  stroke, circulatory complications, vision complications, kidney impairment, sexual dysfunction;      Venous Insufficiency   (Status: Goal on Track (progressing): YES.) Long Term Goal  Evaluation of current treatment plan related to  Venous Insufficiency ,  self-management and patient's adherence to plan as established by provider. Discussed plans with patient for ongoing care management follow up and provided patient with direct contact information for care management team Advised patient to call the office for increased swelling, broken areas, or changes in temperature to the extremities; Provided education to patient re: venous insufficiency and sx and sx of decreased circulations, factors that the patient needs to alert the provider, and keeping appointments with the provider for further testing and evaluation; Reviewed medications with patient and discussed compliance ; Provided patient with ways to decrease swelling  educational materials related to venous insufficiency and changes in circulation to extremities; Reviewed scheduled/upcoming provider appointments including has ultrasound on bilateral legs this week 01-19-2021 and will follow up with Dr. Lucky Cowboy after the ultrasound. Knows to call the office for changes in conditions, questions, or concerns; Discussed plans with patient for ongoing care management follow up and provided patient with direct contact information for care management team; Screening for signs and symptoms of depression related to chronic disease state;  Assessed social determinant of health barriers;         Lewy Body Dementia   (Status: Goal on Track (progressing): YES.) Long Term Goal  Evaluation of current treatment plan related to Dementia, Level of care concerns and Memory Deficits self-management and patient's adherence to plan as established by provider. 11-13-2020: The patient saw the neurologist recently and had changes in medications. A consult for speech therapy  was made. The patients daughter states that she has not heard from them yet. Sometimes the patient has a hard time with his words and needs. Education and support given. Will continue to monitor. 01-15-2021: The patient had changes in medications and therefore it caused some nausea with vomiting. The patient saw the pcp in December and had an increase in his Omeprazole to 40 mg daily. Since the increase the patients daughter states he has hand no further episodes of nausea with vomiting. The patient is stable as far as his memory and cognition. The daughter knows to call for changes, questions, or concerns.  Discussed plans with patient for ongoing care management follow up and provided patient with direct contact information for care management team Advised patient to call the office for changes in memory, depression, anxiety, mood; Provided education to patient re: The 36 hour day resources for caregivers that have loved ones with dementia; Reviewed medications with patient and discussed compliance. The patient saw the neurologist recently and his rivastigmine was increased from the original 1.5 mg BID. 01-15-2021: The Rivastigmine is working well for the patient but he was having new onset of nausea with vomiting. The patients daughter states that since the Omeprazole was increased to 40 mg that the patient is doing better and no other concerns with N&V. Provided patient with resources to help with understanding Lewy body dementia educational materials related to progression of lewy body dementia; Reviewed scheduled/upcoming provider appointments including saw neurologist recently and keeps appointments as scheduled, knows to call the pcp office for new needs or concerns. 01-15-2021: The patients  daughter knows to call the office for changes in conditions, questions, or concerns. The patient sees the specialist on a regular basis.; Discussed plans with patient for ongoing care management follow up and provided  patient with direct contact information for care management team; Advised patient to discuss progression and changes with provider; Screening for signs and symptoms of depression related to chronic disease state;  Assessed social determinant of health barriers;     Patient Goals/Self-Care Activities: Patient will self administer medications as prescribed as evidenced by self report/primary caregiver report  Patient will attend all scheduled provider appointments as evidenced by clinician review of documented attendance to scheduled appointments and patient/caregiver report Patient will call pharmacy for medication refills as evidenced by patient report and review of pharmacy fill history as appropriate Patient will attend church or other social activities as evidenced by patient report Patient will continue to perform ADL's independently as evidenced by patient/caregiver report Patient will call provider office for new concerns or questions as evidenced by review of documented incoming telephone call notes and patient report Patient will work with BSW to address care coordination needs and will continue to work with the clinical team to address health care and disease management related needs as evidenced by documented adherence to scheduled care management/care coordination appointments - check blood pressure 3 times per week - choose a place to take my blood pressure (home, clinic or office, retail store) - write blood pressure results in a log or diary - learn about high blood pressure - keep a blood pressure log - take blood pressure log to all doctor appointments - call doctor for signs and symptoms of high blood pressure - develop an action plan for high blood pressure - keep all doctor appointments - take medications for blood pressure exactly as prescribed - report new symptoms to your doctor - eat more whole grains, fruits and vegetables, lean meats and healthy fats - call for  medicine refill 2 or 3 days before it runs out - take all medications exactly as prescribed - call doctor with any symptoms you believe are related to your medicine - call doctor when you experience any new symptoms - go to all doctor appointments as scheduled - adhere to prescribed diet: Heart Healthy Diet      Our next appointment is by telephone on 03-12-2021 at 0900 am  Please call the care guide team at [phone] if you need to cancel or reschedule your appointment.   If you are experiencing a Mental Health or [place] or need someone to talk to, please call the Suicide and Crisis Lifeline: 988 call the [place] National Suicide Prevention Lifeline: (316)103-0372 or TTY: 9725315270 TTY 416-156-4976) to talk to a trained counselor call 1-800-273-TALK (toll free, 24 hour hotline)   Patient verbalizes understanding of instructions provided today and agrees to view in [employer].   Noreene Larsson RN, MSN, [facility] [place] Mobile: [phone]

## 2021-01-15 NOTE — Chronic Care Management (AMB) (Signed)
Chronic Care Management   CCM RN Visit Note  01/15/2021 Name: Randy [name] MRN: [id number] DOB: Jul 31, 1934  Subjective: Randy [name] is a 87 y.o. year old male who is a primary care patient of [name], DO. The care management team was consulted for assistance with disease management and care coordination needs.    Engaged with patient by telephone for follow up visit in response to provider referral for case management and/or care coordination services.   Consent to Services:  The patient was given information about Chronic Care Management services, agreed to services, and gave verbal consent prior to initiation of services.  Please see initial visit note for detailed documentation.   Patient agreed to services and verbal consent obtained.   Assessment: Review of patient past medical history, allergies, medications, health status, including review of consultants reports, laboratory and other test data, was performed as part of comprehensive evaluation and provision of chronic care management services.   SDOH (Social Determinants of Health) assessments and interventions performed:    CCM Care Plan  Allergies  Allergen Reactions   Antihistamines, Chlorpheniramine-Type Other (See Comments)    Can't urinate    Outpatient Encounter Medications as of 01/15/2021  Medication Sig   acetaminophen (TYLENOL) 500 MG tablet Take 1,000 mg by mouth every 6 (six) hours as needed for moderate pain.   benzonatate (TESSALON) 100 MG capsule Take 1 capsule (100 mg total) by mouth 3 (three) times daily as needed for cough.   carbidopa-levodopa (SINEMET CR) 50-200 MG tablet Take 1 tablet by mouth 4 (four) times daily.   cholecalciferol (VITAMIN D) 1000 units tablet Take 1,000 Units by mouth daily.   Coenzyme Q10 (CO Q 10) 100 MG CAPS Take 100 mg by mouth daily.    diclofenac Sodium (VOLTAREN) 1 % GEL APPLY 2 G TOPICALLY 4 (FOUR) TIMES DAILY AS NEEDED. TO KNEE ARTHRITIS PAIN   enalapril  (VASOTEC) 5 MG tablet Take 1 tablet (5 mg total) by mouth daily.   fluticasone (FLONASE) 50 MCG/ACT nasal spray Place 2 sprays into both nostrils as needed for allergies.   furosemide (LASIX) 20 MG tablet Take 1 tablet (20 mg total) by mouth every other day.   Multiple Vitamin (MULTIVITAMIN WITH MINERALS) TABS tablet Take 1 tablet by mouth daily.   Omega-3 Fatty Acids (FISH OIL) 1200 MG CAPS Take 1,200 mg by mouth in the morning, at noon, in the evening, and at bedtime.   omeprazole (PRILOSEC) 20 MG capsule Take 40 mg by mouth daily.   ondansetron (ZOFRAN-ODT) 4 MG disintegrating tablet Take 1 tablet (4 mg total) by mouth every 8 (eight) hours as needed for nausea or vomiting.   rivastigmine (EXELON) 3 MG capsule Take 3 mg by mouth 2 (two) times daily.   sertraline (ZOLOFT) 50 MG tablet Take 50 mg by mouth at bedtime.   traMADol (ULTRAM) 50 MG tablet Take 1 tablet (50 mg total) by mouth every 8 (eight) hours as needed.   vitamin C (ASCORBIC ACID) 500 MG tablet Take 500 mg by mouth daily.   zinc sulfate 220 (50 Zn) MG capsule Take 1 capsule (220 mg total) by mouth daily.   No facility-administered encounter medications on file as of 01/15/2021.    Patient Active Problem List   Diagnosis Date Noted   Swelling of limb 12/26/2020   Bilateral primary osteoarthritis of knee 02/15/2020   Pressure injury of buttock, stage 1    Delirium    COVID-19 virus infection 10/08/2019  Unable to care for self 10/08/2019   Caregiver unable to cope 10/08/2019   Recurrent falls 10/08/2019   Lewy body dementia without behavioral disturbance [facility])    Prostate cancer ([place]) 06/30/2019   BPH with urinary obstruction 11/06/2018   Acute cholecystitis 09/08/2018   Myalgia due to statin 02/24/2018   Tricompartment osteoarthritis of left knee 11/14/2017   Generalized weakness 04/28/2017   Chronic low back pain 03/12/2017   Knee pain, chronic 10/01/2016   Chronic venous insufficiency 08/30/2016   BPH without  obstruction/lower urinary tract symptoms 03/06/2016   Osteoarthritis of multiple joints 03/06/2016   Degenerative joint disease (DJD) of lumbar spine 03/06/2016   Essential hypertension 03/05/2016   Hyperlipidemia 03/05/2016   Osteoporosis 03/05/2016   GERD (gastroesophageal reflux disease) 03/05/2016   Bilateral lower extremity edema 03/05/2016   Dupuytren's disease of palm 05/28/2015    Conditions to be addressed/monitored:HTN, HLD, Dementia, and urinary incontinence and venous insufficiency  Care Plan : RNCM: General Plan of Care (Adult) For Chronic Disease Management and Care Coordination Needs  Updates made by Vanita Ingles, RN since 01/15/2021 12:00 AM     Problem: RNCM; Development of Plan of Care for Chronic Disease Management ( lewy body dementia, HTN, HLD, Urinary Incontinence)   Priority: High     Long-Range Goal: RNCM; Effective Management  of Plan of Care for Chronic Disease Management ( lewy body dementia, HTN, HLD, Urinary Incontinence)   Start Date: 11/13/2020  Expected End Date: 11/13/2021  Priority: High  Note:   Current Barriers:  Knowledge Deficits related to plan of care for management of HTN, HLD, and Demential and Urinary Incontinence with enlarged prostate  Care Coordination needs related to Memory Deficits and family support and education due to Lewy Body Dementia  Chronic Disease Management support and education needs related to HTN, HLD, and Dementia and urinary Incontinence  Cognitive Deficits  RNCM Clinical Goal(s):  Patient will verbalize understanding of plan for management of HTN, HLD, Dementia, and Urinary Incontinence with recent bladder stone as evidenced by effective management of chronic condtions without any exacerbations and maintaining health and well being take all medications exactly as prescribed and will call provider for medication related questions as evidenced by compliance with medications and working with the pharm D for questions or  concerns related to medications    attend all scheduled medical appointments: no upcoming with pcp but has with specialist  as evidenced by keeping appointments and calling the office for changes in condition or new concerns that need to be addressed         continue to work with RN Care Manager and/or Social Worker to address care management and care coordination needs related to HTN, HLD, Dementia, and Urinary incontinence  as evidenced by adherence to CM Team Scheduled appointments     demonstrate a decrease in Urinary incontinence with recent bladder stone, and changes in memory due to lewy body demential exacerbations  as evidenced by stable conditions, no acute exacerbations and working with the CCM team to manage health and well being demonstrate ongoing self health care management ability of effective management of chronic conditions as evidenced by  working with the CCM team  through collaboration with Consulting civil engineer, provider, and care team.   Interventions: 1:1 collaboration with primary care provider regarding development and update of comprehensive plan of care as evidenced by provider attestation and co-signature Inter-disciplinary care team collaboration (see longitudinal plan of care) Evaluation of current treatment plan related to  self  management and patient's adherence to plan as established by provider   SDOH Barriers (Status: Goal on Track (progressing): YES.) Long Term Goal  Patient interviewed and SDOH assessment performed        Patient interviewed and appropriate assessments performed Provided patient with information about resources in the county and care guides avaialble to assist with changes in SDOH or new needs as they arise Discussed plans with patient for ongoing care management follow up and provided patient with direct contact information for care management team Advised patient to call the office for changes in SDOH, Question, or Concerns Provided education to  patient/caregiver regarding level of care options.    Urinary Incontinence with enlarged prostate and recent bladder stone  (Status: Goal on Track (progressing): YES. No Needs Identified this visit) Long Term Goal  Evaluation of current treatment plan related to  Urinary incontinence with enlarged prostate and recent bladder stone  , Level of care concerns and Memory Deficits self-management and patient's adherence to plan as established by provider. 11-13-2020: The patient recently had bladder stone removed on 10-20-2020. The patients daughter states that since removal of the bladder stone the patient denies any pain on urination and states he feels better. The daughter has taken the patient to follow ups with urologist. Review of sx and sx to monitor for changes in urinary health and well being. 01-15-2021: The patients daughter states that he is doing well since the procedure last year. He has not had anymore complaints or issues related to urinary retention. Will continue to monitor for changes.  Discussed plans with patient for ongoing care management follow up and provided patient with direct contact information for care management team Advised patient to call the office for changes in urinary health, questions or concerns; Provided education to patient re: monitoring for sx and sx of UTI, increased pain on urinarion, new concerns related to urinary incontinence. 01-15-2021: Review and the patient or daughter denies any issues related to urinary retention or concerns ; Reviewed medications with patient and discussed compliance, the wife and daughter assist with medications management. 01-15-2021: The daughter states compliance with medications  ; Reviewed scheduled/upcoming provider appointments including Decemember 2022, with Urology specialist  The patien thas no appointment with the pcp coming up but education provided to the daughter to call the office for changes. 01-15-2021: Follows up with the provider  as needed. Denies any new concerns with urinary retention; Discussed plans with patient for ongoing care management follow up and provided patient with direct contact information for care management team; Advised patient to discuss new development of urinary sx and sx with provider; Screening for signs and symptoms of depression related to chronic disease state;  Assessed social determinant of health barriers;   Hyperlipidemia:  (Status: Goal on Track (progressing): YES.) Lab Results  Component Value Date   CHOL 156 07/17/2020   HDL 37 (L) 07/17/2020   LDLCALC 105 (H) 07/17/2020   TRIG 71 07/17/2020   CHOLHDL 4.2 07/17/2020     Medication review performed; medication list updated in electronic medical record.  Provider established cholesterol goals reviewed; Counseled on importance of regular laboratory monitoring as prescribed; Provided HLD educational materials; Reviewed role and benefits of statin for ASCVD risk reduction; Discussed strategies to manage statin-induced myalgias; Reviewed importance of limiting foods high in cholesterol. 01-15-2021: The patients daughter states that the patient is not eating as well as he once was. She states partly because she things he does not want to prepare it or  he forgets. His wife makes sure he eats when she is up but her daughter is working a lot when he is up and doing daily activities. Education and support given.   Hypertension: (Status: Goal on Track (progressing): YES.) Last practice recorded BP readings:  BP Readings from Last 3 Encounters:  12/26/20 (!) 152/82  12/21/20 111/73  12/20/20 122/74  Most recent eGFR/CrCl: No results found for: EGFR  No components found for: CRCL  Evaluation of current treatment plan related to hypertension self management and patient's adherence to plan as established by provider. 11-13-2020: The patient has long standing issues with his blood pressures fluctuating. The patient does take his blood pressures  at home. The patients daughter and wife assist with his care. States compliance with medications. Will continue to monitor. 01-15-2021: The patient is still having fluctuations in his blood pressures. The patient saw Dr. Lucky Cowboy recently for venous insufficiency and will have further testing on 01-19-2021: The patients daughter states his right leg is more swollen that the left. See new disease process under venous insufficiency.    Reviewed prescribed diet heart healthy diet. 01-15-2021: Review of heart healthy diet Reviewed medications with patient and discussed importance of compliance. 01-15-2021: Review of medications compliance and the patients daughter verbalized compliance ;  Discussed plans with patient for ongoing care management follow up and provided patient with direct contact information for care management team; Advised patient, providing education and rationale, to monitor blood pressure daily and record, calling PCP for findings outside established parameters;  Advised patient to discuss blood pressure trends  with provider; Provided education on prescribed diet heart healthy diet ;  Discussed complications of poorly controlled blood pressure such as heart disease, stroke, circulatory complications, vision complications, kidney impairment, sexual dysfunction;    Venous Insufficiency   (Status: Goal on Track (progressing): YES.) Long Term Goal  Evaluation of current treatment plan related to  Venous Insufficiency ,  self-management and patient's adherence to plan as established by provider. Discussed plans with patient for ongoing care management follow up and provided patient with direct contact information for care management team Advised patient to call the office for increased swelling, broken areas, or changes in temperature to the extremities; Provided education to patient re: venous insufficiency and sx and sx of decreased circulations, factors that the patient needs to alert the provider,  and keeping appointments with the provider for further testing and evaluation; Reviewed medications with patient and discussed compliance ; Provided patient with ways to decrease swelling  educational materials related to venous insufficiency and changes in circulation to extremities; Reviewed scheduled/upcoming provider appointments including has ultrasound on bilateral legs this week 01-19-2021 and will follow up with Dr. Lucky Cowboy after the ultrasound. Knows to call the office for changes in conditions, questions, or concerns; Discussed plans with patient for ongoing care management follow up and provided patient with direct contact information for care management team; Screening for signs and symptoms of depression related to chronic disease state;  Assessed social determinant of health barriers;      Lewy Body Dementia   (Status: Goal on Track (progressing): YES.) Long Term Goal  Evaluation of current treatment plan related to Dementia, Level of care concerns and Memory Deficits self-management and patient's adherence to plan as established by provider. 11-13-2020: The patient saw the neurologist recently and had changes in medications. A consult for speech therapy was made. The patients daughter states that she has not heard from them yet. Sometimes the patient has a hard  time with his words and needs. Education and support given. Will continue to monitor. 01-15-2021: The patient had changes in medications and therefore it caused some nausea with vomiting. The patient saw the pcp in December and had an increase in his Omeprazole to 40 mg daily. Since the increase the patients daughter states he has hand no further episodes of nausea with vomiting. The patient is stable as far as his memory and cognition. The daughter knows to call for changes, questions, or concerns.  Discussed plans with patient for ongoing care management follow up and provided patient with direct contact information for care management  team Advised patient to call the office for changes in memory, depression, anxiety, mood; Provided education to patient re: The 36 hour day resources for caregivers that have loved ones with dementia; Reviewed medications with patient and discussed compliance. The patient saw the neurologist recently and his rivastigmine was increased from the original 1.5 mg BID. 01-15-2021: The Rivastigmine is working well for the patient but he was having new onset of nausea with vomiting. The patients daughter states that since the Omeprazole was increased to 40 mg that the patient is doing better and no other concerns with N&V. Provided patient with resources to help with understanding Lewy body dementia educational materials related to progression of lewy body dementia; Reviewed scheduled/upcoming provider appointments including saw neurologist recently and keeps appointments as scheduled, knows to call the pcp office for new needs or concerns. 01-15-2021: The patients daughter knows to call the office for changes in conditions, questions, or concerns. The patient sees the specialist on a regular basis.; Discussed plans with patient for ongoing care management follow up and provided patient with direct contact information for care management team; Advised patient to discuss progression and changes with provider; Screening for signs and symptoms of depression related to chronic disease state;  Assessed social determinant of health barriers;    Patient Goals/Self-Care Activities: Patient will self administer medications as prescribed as evidenced by self report/primary caregiver report  Patient will attend all scheduled provider appointments as evidenced by clinician review of documented attendance to scheduled appointments and patient/caregiver report Patient will call pharmacy for medication refills as evidenced by patient report and review of pharmacy fill history as appropriate Patient will attend church or other  social activities as evidenced by patient report Patient will continue to perform ADL's independently as evidenced by patient/caregiver report Patient will call provider office for new concerns or questions as evidenced by review of documented incoming telephone call notes and patient report Patient will work with BSW to address care coordination needs and will continue to work with the clinical team to address health care and disease management related needs as evidenced by documented adherence to scheduled care management/care coordination appointments - check blood pressure 3 times per week - choose a place to take my blood pressure (home, clinic or office, retail store) - write blood pressure results in a log or diary - learn about high blood pressure - keep a blood pressure log - take blood pressure log to all doctor appointments - call doctor for signs and symptoms of high blood pressure - develop an action plan for high blood pressure - keep all doctor appointments - take medications for blood pressure exactly as prescribed - report new symptoms to your doctor - eat more whole grains, fruits and vegetables, lean meats and healthy fats - call for medicine refill 2 or 3 days before it runs out - take all medications  exactly as prescribed - call doctor with any symptoms you believe are related to your medicine - call doctor when you experience any new symptoms - go to all doctor appointments as scheduled - adhere to prescribed diet: Heart Healthy Diet       Plan:Telephone follow up appointment with care management team member scheduled for:  03-12-2021 at 0900 am  [place], MSN, [place] Mobile: [phone]

## 2021-01-16 ENCOUNTER — Ambulatory Visit: Payer: Medicare Other | Admitting: Licensed Clinical Social Worker

## 2021-01-16 DIAGNOSIS — I10 Essential (primary) hypertension: Secondary | ICD-10-CM

## 2021-01-16 DIAGNOSIS — G8929 Other chronic pain: Secondary | ICD-10-CM

## 2021-01-16 DIAGNOSIS — M810 Age-related osteoporosis without current pathological fracture: Secondary | ICD-10-CM

## 2021-01-16 DIAGNOSIS — M545 Low back pain, unspecified: Secondary | ICD-10-CM

## 2021-01-16 DIAGNOSIS — G3183 Dementia with Lewy bodies: Secondary | ICD-10-CM

## 2021-01-16 DIAGNOSIS — M159 Polyosteoarthritis, unspecified: Secondary | ICD-10-CM

## 2021-01-16 DIAGNOSIS — F0280 Dementia in other diseases classified elsewhere without behavioral disturbance: Secondary | ICD-10-CM

## 2021-01-18 NOTE — Patient Instructions (Signed)
Visit Information  Thank you for taking time to visit with me today. Please don't hesitate to contact me if I can be of assistance to you before our next scheduled telephone appointment.  Following are the goals we discussed today:  Patient Self Care Activities:  Attend all scheduled provider appointments Call provider office for new concerns or questions Continue utilizing healthy coping strategies to manage stressors  Our next appointment is by telephone on 05/08/21   Please call the care guide team at [phone] if you need to cancel or reschedule your appointment.   If you are experiencing a Mental Health or [place] or need someone to talk to, please call the Suicide and Crisis Lifeline: 988 call 911   Patient verbalizes understanding of instructions and care plan provided today and agrees to view in [place]. Active MyChart status confirmed with patient.    Christa See, MSW, [facility] Care Management [email or web address] Phone [phone] 9:46 AM

## 2021-01-18 NOTE — Chronic Care Management (AMB) (Signed)
Chronic Care Management    Clinical Social Work Note  01/18/2021 Name: ALGIE WESTRY MRN: [id number] DOB: 1934-02-14  MARSHAUN LORTIE is a 87 y.o. year old male who is a primary care patient of [name], DO. The CCM team was consulted to assist the patient with chronic disease management and/or care coordination needs related to: Mental Health Counseling and Resources and Caregiver Stress.   Engaged with patient's daughter by telephone for follow up visit in response to provider referral for social work chronic care management and care coordination services.   Consent to Services:  The patient was given information about Chronic Care Management services, agreed to services, and gave verbal consent prior to initiation of services.  Please see initial visit note for detailed documentation.   Patient agreed to services and consent obtained.    Summary:  Patient continues to maintain positive progress with care plan goals. Palliative care continues to be a support for family in the home and patient recently began PT.  See Care Plan below for interventions and patient self-care activities.  Recommendation: Patient may benefit from, and is in agreement work with LCSW to address care coordination needs and will continue to work with the clinical team to address health care and disease management related needs.   Follow up Plan: Patient would like continued follow-up from CCM LCSW.  per patient's request will follow up in 05/08/21.  Will call office if needed prior to next encounter.      SDOH (Social Determinants of Health) assessments and interventions performed:    Advanced Directives Status: Not addressed in this encounter.  CCM Care Plan  Allergies  Allergen Reactions   Antihistamines, Chlorpheniramine-Type Other (See Comments)    Can't urinate    Outpatient Encounter Medications as of 01/16/2021  Medication Sig   acetaminophen (TYLENOL) 500 MG tablet Take 1,000 mg by  mouth every 6 (six) hours as needed for moderate pain.   benzonatate (TESSALON) 100 MG capsule Take 1 capsule (100 mg total) by mouth 3 (three) times daily as needed for cough.   carbidopa-levodopa (SINEMET CR) 50-200 MG tablet Take 1 tablet by mouth 4 (four) times daily.   cholecalciferol (VITAMIN D) 1000 units tablet Take 1,000 Units by mouth daily.   Coenzyme Q10 (CO Q 10) 100 MG CAPS Take 100 mg by mouth daily.    diclofenac Sodium (VOLTAREN) 1 % GEL APPLY 2 G TOPICALLY 4 (FOUR) TIMES DAILY AS NEEDED. TO KNEE ARTHRITIS PAIN   enalapril (VASOTEC) 5 MG tablet Take 1 tablet (5 mg total) by mouth daily.   fluticasone (FLONASE) 50 MCG/ACT nasal spray Place 2 sprays into both nostrils as needed for allergies.   furosemide (LASIX) 20 MG tablet Take 1 tablet (20 mg total) by mouth every other day.   Multiple Vitamin (MULTIVITAMIN WITH MINERALS) TABS tablet Take 1 tablet by mouth daily.   Omega-3 Fatty Acids (FISH OIL) 1200 MG CAPS Take 1,200 mg by mouth in the morning, at noon, in the evening, and at bedtime.   omeprazole (PRILOSEC) 20 MG capsule Take 40 mg by mouth daily.   ondansetron (ZOFRAN-ODT) 4 MG disintegrating tablet Take 1 tablet (4 mg total) by mouth every 8 (eight) hours as needed for nausea or vomiting.   rivastigmine (EXELON) 3 MG capsule Take 3 mg by mouth 2 (two) times daily.   sertraline (ZOLOFT) 50 MG tablet Take 50 mg by mouth at bedtime.   traMADol (ULTRAM) 50 MG tablet Take 1 tablet (50 mg  total) by mouth every 8 (eight) hours as needed.   vitamin C (ASCORBIC ACID) 500 MG tablet Take 500 mg by mouth daily.   zinc sulfate 220 (50 Zn) MG capsule Take 1 capsule (220 mg total) by mouth daily.   No facility-administered encounter medications on file as of 01/16/2021.    Patient Active Problem List   Diagnosis Date Noted   Swelling of limb 12/26/2020   Bilateral primary osteoarthritis of knee 02/15/2020   Pressure injury of buttock, stage 1    Delirium    COVID-19 virus  infection 10/08/2019   Unable to care for self 10/08/2019   Caregiver unable to cope 10/08/2019   Recurrent falls 10/08/2019   Lewy body dementia without behavioral disturbance ([place])    Prostate cancer ([place]) 06/30/2019   BPH with urinary obstruction 11/06/2018   Acute cholecystitis 09/08/2018   Myalgia due to statin 02/24/2018   Tricompartment osteoarthritis of left knee 11/14/2017   Generalized weakness 04/28/2017   Chronic low back pain 03/12/2017   Knee pain, chronic 10/01/2016   Chronic venous insufficiency 08/30/2016   BPH without obstruction/lower urinary tract symptoms 03/06/2016   Osteoarthritis of multiple joints 03/06/2016   Degenerative joint disease (DJD) of lumbar spine 03/06/2016   Essential hypertension 03/05/2016   Hyperlipidemia 03/05/2016   Osteoporosis 03/05/2016   GERD (gastroesophageal reflux disease) 03/05/2016   Bilateral lower extremity edema 03/05/2016   Dupuytren's disease of palm 05/28/2015    Conditions to be addressed/monitored: Dementia; Memory Deficits, Inability to perform ADL's independently, and Inability to perform IADL's independently  Care Plan : General Social Work (Adult)  Updates made by Rebekah Chesterfield, LCSW since 01/18/2021 12:00 AM     Problem: Caregiver Stress      Long-Range Goal: Caregiver Coping Optimized for Daughter   Start Date: 04/11/2020  This Visit's Progress: On track  Recent Progress: On track  Priority: Medium  Note:   Current Barriers:  Financial constraints Level of care concerns ADL IADL limitations Social Isolation Limited access to caregiver Memory Deficits Lacks knowledge of community resource: personal care service resources within the area that may benefit patient Lack of socialization  Clinical Social Work Clinical Goal(s):  Over the next 120 days, client will work with SW to address concerns related to lack of care/support within the home Interventions: Patient's daughter provided all information  during visit Family has not observed an increase in depression or anxiety symptoms 10/25: Per daughter, patient's memory continues to decline. Pt's wife continues to provide support in the home. Patient remains independent when preparing simple meals; however, family is always available to assist. CCM LCSW discussed strategies to decrease fall risk. Family has installed shower rails and has a shower chair to promote safety 1/10: Patient recently completed initial physical therapy appointment on 01/15/21. Pt's spouse was encouraged by daughter to allow a few more sessions to obtain rapport with family, who is interested in strategies to motivate pt to increase activity  Palliative Care has visited the home and completed referral for meal delivery. Family is appreciative for the support 1/10: Palliative care continues to provide in-home visits to strengthen support. Pt is on the waiting list with [facility] for assistance with an aid. CCM LCSW provided pt's daughter with contact information, so that she can check status of referral. Additional supportive resources to assist with caregiver support provided  Patient utilizes walker and/or rollator to promote safety while ambulating in and out of the home. Patient continues to take Tylenol to assist with temporary  pain management CCM LCSW acknowledged caregiver stress. Strategies to enhance self-care were identified  CCM LCSW informed patient's daughter that [facility] has opened back up. Family has their contact information and agreed to contact them when ready 08/10: He is not interested in participating in day centers, at this time Discussed plans with patient for ongoing care management follow up and provided patient with direct contact information for care management team Advised patient to contact CCM team with any urgent concerns 1:1 collaboration with primary care provider regarding development and update of comprehensive plan of care as evidenced  by provider attestation and co-signature Patient Self Care Activities:  Attend all scheduled provider appointments Call provider office for new concerns or questions Continue utilizing healthy coping strategies to manage stressors       Christa See, MSW, [email or web address] Phone [phone] 9:43 AM

## 2021-01-19 ENCOUNTER — Ambulatory Visit (INDEPENDENT_AMBULATORY_CARE_PROVIDER_SITE_OTHER): Payer: Medicare Other

## 2021-01-19 ENCOUNTER — Encounter (INDEPENDENT_AMBULATORY_CARE_PROVIDER_SITE_OTHER): Payer: Self-pay | Admitting: Nurse Practitioner

## 2021-01-19 ENCOUNTER — Other Ambulatory Visit: Payer: Self-pay

## 2021-01-19 ENCOUNTER — Ambulatory Visit (INDEPENDENT_AMBULATORY_CARE_PROVIDER_SITE_OTHER): Payer: Medicare Other | Admitting: Nurse Practitioner

## 2021-01-19 VITALS — BP 166/84 | HR 66 | Resp 15 | Wt 209.4 lb

## 2021-01-19 DIAGNOSIS — I890 Lymphedema, not elsewhere classified: Secondary | ICD-10-CM

## 2021-01-19 DIAGNOSIS — M7989 Other specified soft tissue disorders: Secondary | ICD-10-CM

## 2021-01-19 DIAGNOSIS — I872 Venous insufficiency (chronic) (peripheral): Secondary | ICD-10-CM

## 2021-01-19 DIAGNOSIS — I10 Essential (primary) hypertension: Secondary | ICD-10-CM

## 2021-01-19 DIAGNOSIS — E782 Mixed hyperlipidemia: Secondary | ICD-10-CM

## 2021-01-29 ENCOUNTER — Encounter (INDEPENDENT_AMBULATORY_CARE_PROVIDER_SITE_OTHER): Payer: Self-pay | Admitting: Nurse Practitioner

## 2021-01-29 NOTE — Progress Notes (Signed)
Subjective:    Patient ID: Randy [name], male    DOB: 03-28-1934, 87 y.o.   MRN: [id number] Chief Complaint  Patient presents with   Follow-up    Ultrasound follow up    Randy [name] is a 87 y.o. male.  He returns for noninvasive studies regarding lower extremity edema.  The patient was last seen over 4 years ago for similar issues, but compression socks and elevation were significantly controlling his swelling and markedly reduced his symptoms at that time.  He did have known venous reflux both in the deep venous system as well as the superficial system at that time, but with good results with conservative therapy we did not perform intervention.  Over the past 4 years, he has become much less active and is now unable to get his compression socks on and off.  He denies any open wounds or infection.  No fevers or chills.  He is getting much more skin discoloration per tickly on the right leg.  Both legs are swelling more.  No open ulcerations or wounds, although the skin is getting somewhat dry and cracked at this point on the right leg.  This is also become heavy and more difficult for him to get around.  Today noninvasive study showed no evidence of DVT or superficial thrombophlebitis bilaterally.  There is evidence of deep venous insufficiency in the left lower extremity as well as superficial venous reflux.  Right lower extremity has no evidence of deep venous insufficiency or superficial venous reflux.   Review of Systems  Cardiovascular:  Positive for leg swelling.  All other systems reviewed and are negative.     Objective:   Physical Exam Vitals reviewed.  HENT:     Head: Normocephalic.  Cardiovascular:     Rate and Rhythm: Normal rate.  Pulmonary:     Effort: Pulmonary effort is normal.  Musculoskeletal:     Right lower leg: 2+ Edema present.     Left lower leg: 2+ Edema present.  Skin:    General: Skin is warm and dry.     Comments: Stasis dermatitis  Neurological:      Mental Status: He is alert and oriented to person, place, and time.  Psychiatric:        Mood and Affect: Mood normal.        Behavior: Behavior normal.        Thought Content: Thought content normal.        Judgment: Judgment normal.    BP (!) 166/84 (BP Location: Right Arm)    Pulse 66    Resp 15    Wt 209 lb 6.4 oz (95 kg)    BMI 28.40 kg/m   Past Medical History:  Diagnosis Date   Arthritis    GERD (gastroesophageal reflux disease)    Hyperlipidemia    Hypertension    Lewy body dementia (HCC)    Osteoporosis    Prostate enlargement     Social History   Socioeconomic History   Marital status: Married    Spouse name: Not on file   Number of children: Not on file   Years of education: College   Highest education level: Not on file  Occupational History   Occupation: Retired Clinical biochemist  Tobacco Use   Smoking status: Former    Packs/day: 1.00    Years: 12.00    Pack years: 12.00    Types: Cigarettes    Quit date: 1968    Years since  quitting: 55.0   Smokeless tobacco: Never  Vaping Use   Vaping Use: Never used  Substance and Sexual Activity   Alcohol use: No   Drug use: No   Sexual activity: Not Currently  Other Topics Concern   Not on file  Social History Narrative   Not on file   Social Determinants of Health   Financial Resource Strain: Low Risk    Difficulty of Paying Living Expenses: Not hard at all  Food Insecurity: No Food Insecurity   Worried About Charity fundraiser in the Last Year: Never true   [place] in the Last Year: Never true  Transportation Needs: No Transportation Needs   Lack of Transportation (Medical): No   Lack of Transportation (Non-Medical): No  Physical Activity: Inactive   Days of Exercise per Week: 0 days   Minutes of Exercise per Session: 0 min  Stress: No Stress Concern Present   Feeling of Stress : Not at all  Social Connections: Socially Integrated   Frequency of Communication with Friends and Family: More  than three times a week   Frequency of Social Gatherings with Friends and Family: More than three times a week   Attends Religious Services: More than 4 times per year   Active Member of Clubs or Organizations: Yes   Attends Music therapist: More than 4 times per year   Marital Status: Married  Human resources officer Violence: Not At Risk   Fear of Current or Ex-Partner: No   Emotionally Abused: No   Physically Abused: No   Sexually Abused: No    Past Surgical History:  Procedure Laterality Date   CHOLECYSTECTOMY N/A 09/10/2018   Procedure: LAPAROSCOPIC CHOLECYSTECTOMY;  Surgeon: Benjamine Sprague, DO;  Location: [facility] ORS;  Service: General;  Laterality: N/A;   CYSTOSCOPY WITH LITHOLAPAXY N/A 10/20/2020   Procedure: CYSTOSCOPY WITH LITHOLAPAXY;  Surgeon: [name], MD;  Location: [facility] ORS;  Service: Urology;  Laterality: N/A;   GANGLION CYST EXCISION Left 2013   Left upper arm   HERNIA REPAIR N/A 2008   M[place]line, ventral acquired hernia repair   HOLEP-LASER ENUCLEATION OF THE PROSTATE WITH MORCELLATION N/A 11/06/2018   Procedure: HOLEP-LASER ENUCLEATION OF THE PROSTATE WITH MORCELLATION;  Surgeon: [name], MD;  Location: [facility] ORS;  Service: Urology;  Laterality: N/A;   HOLEP-LASER ENUCLEATION OF THE PROSTATE WITH MORCELLATION N/A 10/20/2020   Procedure: HOLEP-LASER ENUCLEATION OF THE PROSTATE WITH MORCELLATION;  Surgeon: [name], MD;  Location: [facility] ORS;  Service: Urology;  Laterality: N/A;    Family History  Problem Relation Age of Onset   Breast cancer Mother    Lung cancer Father    Heart disease Brother    Leukemia Brother    Prostate cancer Neg Hx    Colon cancer Neg Hx    Bladder Cancer Neg Hx    Kidney cancer Neg Hx     Allergies  Allergen Reactions   Antihistamines, Chlorpheniramine-Type Other (See Comments)    Can't urinate    CBC Latest Ref Rng & Units 10/17/2020 05/09/2020 10/08/2019  WBC 4.0 - 10.5 K/uL 5.3 6.8 4.5  Hemoglobin 13.0 -  17.0 g/dL 12.3(L) 12.1(L) 12.5(L)  Hematocrit 39.0 - 52.0 % 35.1(L) 35.5(L) 36.1(L)  Platelets 150 - 400 K/uL 182 192 144(L)      CMP     Component Value Date/Time   NA 141 10/17/2020 1054   NA 143 04/17/2015 0826   K 3.8 10/17/2020 1054  K 4.0 04/17/2015 0826   CL 104 10/17/2020 1054   CL 102 04/17/2015 0826   CO2 30 10/17/2020 1054   CO2 25 11/21/2014 0905   GLUCOSE 77 10/17/2020 1054   BUN 21 10/17/2020 1054   BUN 18 04/17/2015 0826   CREATININE 0.90 10/17/2020 1054   CREATININE 1.10 04/02/2019 0925   CALCIUM 8.5 (L) 10/17/2020 1054   CALCIUM 8.5 (A) 04/17/2015 0826   PROT 6.5 05/09/2020 1441   PROT 6.3 04/17/2015 0826   ALBUMIN 3.3 (L) 05/09/2020 1441   ALBUMIN 3.9 04/17/2015 0826   AST 16 05/09/2020 1441   AST 14 04/17/2015 0826   ALT 5 05/09/2020 1441   ALT 8 04/17/2015 0826   ALKPHOS 60 05/09/2020 1441   ALKPHOS 62 04/17/2015 0826   BILITOT 1.2 05/09/2020 1441   BILITOT 0.9 04/17/2015 0826   GFRNONAA >60 10/17/2020 1054   GFRNONAA 62 04/02/2019 0925   GFRAA >60 10/12/2019 0416   GFRAA 72 04/02/2019 0925     No results found.     Assessment & Plan:   1. Lymphedema Recommend:  No surgery or intervention at this point in time.    I have reviewed my previous discussion with the patient regarding swelling and why it causes symptoms.  Patient will continue wearing graduated compression stockings class 1 (20-30 mmHg) on a daily basis. The patient will  beginning wearing the stockings first thing in the morning and removing them in the evening. The patient is instructed specifically not to sleep in the stockings.    In addition, behavioral modification including several periods of elevation of the lower extremities during the day will be continued.  This was reviewed with the patient during the initial visit.  The patient will also continue routine exercise, especially walking on a daily basis as was discussed during the initial visit.    Despite  conservative treatments of at least 4 weeks, including graduated compression therapy class 1 and behavioral modification including exercise and elevation the patient  has not obtained adequate control of the lymphedema.  The patient still has stage 3 lymphedema and therefore, I believe that a lymph pump should be added to improve the control of the patient's lymphedema.  Additionally, a lymph pump is warranted because it will reduce the risk of cellulitis and ulceration in the future.  Patient should follow-up in six months    2. Essential hypertension Continue antihypertensive medications as already ordered, these medications have been reviewed and there are no changes at this time.   3. Mixed hyperlipidemia Continue fish oil as ordered and reviewed, no changes at this time    Current Outpatient Medications on File Prior to Visit  Medication Sig Dispense Refill   acetaminophen (TYLENOL) 500 MG tablet Take 1,000 mg by mouth every 6 (six) hours as needed for moderate pain.     benzonatate (TESSALON) 100 MG capsule Take 1 capsule (100 mg total) by mouth 3 (three) times daily as needed for cough. 30 capsule 0   carbidopa-levodopa (SINEMET CR) 50-200 MG tablet Take 1 tablet by mouth 4 (four) times daily.     cholecalciferol (VITAMIN D) 1000 units tablet Take 1,000 Units by mouth daily.     Coenzyme Q10 (CO Q 10) 100 MG CAPS Take 100 mg by mouth daily.      diclofenac Sodium (VOLTAREN) 1 % GEL APPLY 2 G TOPICALLY 4 (FOUR) TIMES DAILY AS NEEDED. TO KNEE ARTHRITIS PAIN 100 g 3   enalapril (VASOTEC) 5 MG tablet  Take 1 tablet (5 mg total) by mouth daily. 90 tablet 3   fluticasone (FLONASE) 50 MCG/ACT nasal spray Place 2 sprays into both nostrils as needed for allergies.     furosemide (LASIX) 20 MG tablet Take 1 tablet (20 mg total) by mouth every other day. 45 tablet 2   Multiple Vitamin (MULTIVITAMIN WITH MINERALS) TABS tablet Take 1 tablet by mouth daily. 30 tablet 0   Omega-3 Fatty Acids (FISH OIL)  1200 MG CAPS Take 1,200 mg by mouth in the morning, at noon, in the evening, and at bedtime.     omeprazole (PRILOSEC) 20 MG capsule Take 40 mg by mouth daily.     ondansetron (ZOFRAN-ODT) 4 MG disintegrating tablet Take 1 tablet (4 mg total) by mouth every 8 (eight) hours as needed for nausea or vomiting. 30 tablet 0   rivastigmine (EXELON) 3 MG capsule Take 3 mg by mouth 2 (two) times daily.     sertraline (ZOLOFT) 50 MG tablet Take 50 mg by mouth at bedtime.     traMADol (ULTRAM) 50 MG tablet Take 1 tablet (50 mg total) by mouth every 8 (eight) hours as needed. 30 tablet 2   vitamin C (ASCORBIC ACID) 500 MG tablet Take 500 mg by mouth daily.     zinc sulfate 220 (50 Zn) MG capsule Take 1 capsule (220 mg total) by mouth daily. 30 capsule 0   No current facility-administered medications on file prior to visit.    There are no Patient Instructions on file for this visit. No follow-ups on file.   Kris Hartmann, NP

## 2021-02-05 ENCOUNTER — Telehealth: Payer: Self-pay | Admitting: Pharmacist

## 2021-02-05 ENCOUNTER — Telehealth: Payer: Medicare Other

## 2021-02-05 NOTE — Telephone Encounter (Signed)
°  Chronic Care Management   Outreach Note  02/05/2021 Name: WHITTAKER LENIS MRN: [id number] DOB: [birth year]  Referred by: [name], DO Reason for referral : No chief complaint on file.   Was unable to reach patient/caregiver via telephone today and have left HIPAA compliant voicemail asking patient to return my call.    Follow Up Plan: Will collaborate with Care Guide to outreach to schedule follow up with me  Wallace Cullens, PharmD, [place] [facility] [phone]

## 2021-02-06 DIAGNOSIS — E782 Mixed hyperlipidemia: Secondary | ICD-10-CM

## 2021-02-06 DIAGNOSIS — M810 Age-related osteoporosis without current pathological fracture: Secondary | ICD-10-CM | POA: Diagnosis not present

## 2021-02-06 DIAGNOSIS — G3183 Dementia with Lewy bodies: Secondary | ICD-10-CM | POA: Diagnosis not present

## 2021-02-06 DIAGNOSIS — M159 Polyosteoarthritis, unspecified: Secondary | ICD-10-CM

## 2021-02-06 DIAGNOSIS — I10 Essential (primary) hypertension: Secondary | ICD-10-CM | POA: Diagnosis not present

## 2021-02-06 DIAGNOSIS — F0280 Dementia in other diseases classified elsewhere without behavioral disturbance: Secondary | ICD-10-CM

## 2021-02-12 ENCOUNTER — Other Ambulatory Visit: Payer: Self-pay

## 2021-02-12 DIAGNOSIS — R600 Localized edema: Secondary | ICD-10-CM

## 2021-02-12 MED ORDER — FUROSEMIDE 20 MG PO TABS: 20.0000 mg | ORAL_TABLET | ORAL | 0 refills | Status: DC

## 2021-02-22 ENCOUNTER — Other Ambulatory Visit: Payer: Medicare Other | Admitting: Primary Care

## 2021-02-23 ENCOUNTER — Ambulatory Visit (INDEPENDENT_AMBULATORY_CARE_PROVIDER_SITE_OTHER): Payer: Medicare Other | Admitting: Pharmacist

## 2021-02-23 DIAGNOSIS — R413 Other amnesia: Secondary | ICD-10-CM

## 2021-02-23 DIAGNOSIS — I10 Essential (primary) hypertension: Secondary | ICD-10-CM

## 2021-02-23 NOTE — Chronic Care Management (AMB) (Signed)
Chronic Care Management CCM Pharmacy Note  02/23/2021 Name:  OLIVIER FRAYRE MRN:  [id number] DOB:  1934-09-15   Subjective: Randy [name] is an 87 y.o. year old male who is a primary patient of [name], DO.  The CCM team was consulted for assistance with disease management and care coordination needs.    Engaged with patient's daughter/caregiver by telephone for follow up visit for pharmacy case management and/or care coordination services.   Objective:  Medications Reviewed Today     Reviewed by [name], RPH-CPP (Pharmacist) on 02/23/21 at 0850  Med List Status: <None>   Medication Order Taking? Sig Documenting Provider Last Dose Status Informant  acetaminophen (TYLENOL) 500 MG tablet [id number] Yes Take 1,000 mg by mouth every 6 (six) hours as needed for moderate pain. [provider] Taking Active Family Member  carbidopa-levodopa (SINEMET CR) 50-200 MG tablet [id number] Yes Take 1 tablet by mouth 4 (four) times daily. [provider] Taking Active Family Member  cholecalciferol (VITAMIN D) 1000 units tablet [id number] Yes Take 1,000 Units by mouth daily. [provider] Taking Active Family Member  Coenzyme Q10 (CO Q 10) 100 MG CAPS [id number] Yes Take 100 mg by mouth daily.  [provider] Taking Active Family Member  diclofenac Sodium (VOLTAREN) 1 % GEL [id number] Yes APPLY 2 G TOPICALLY 4 (FOUR) TIMES DAILY AS NEEDED. TO KNEE ARTHRITIS PAIN [name], DO Taking Active Family Member  enalapril (VASOTEC) 5 MG tablet [id number] Yes Take 1 tablet (5 mg total) by mouth daily. [name], DO Taking Active   fluticasone (FLONASE) 50 MCG/ACT nasal spray [id number] Yes Place 2 sprays into both nostrils as needed for allergies. [provider] Taking Active Family Member           Med Note [name], [name]   Mon Oct 26, 2018  1:07 PM)    furosemide (LASIX) 20 MG tablet [id number] Yes Take 1 tablet  (20 mg total) by mouth every other day. Kate Sable, MD Taking Active   Multiple Vitamin (MULTIVITAMIN WITH MINERALS) TABS tablet [id number] Yes Take 1 tablet by mouth daily. [name], MD Taking Active Family Member  Omega-3 Fatty Acids (FISH OIL) 1200 MG CAPS [id number] Yes Take 1,200 mg by mouth in the morning, at noon, in the evening, and at bedtime. [provider] Taking Active Family Member  omeprazole (PRILOSEC) 20 MG capsule [id number] Yes Take 40 mg by mouth daily. [provider] Taking Active Family Member  rivastigmine (EXELON) 3 MG capsule [id number] Yes Take 3 mg by mouth 2 (two) times daily. [provider] Taking Active   sertraline (ZOLOFT) 50 MG tablet [id number] Yes Take 50 mg by mouth at bedtime. [provider] Taking Active Family Member  traMADol (ULTRAM) 50 MG tablet [id number] No Take 1 tablet (50 mg total) by mouth every 8 (eight) hours as needed.  Patient not taking: Reported on 02/23/2021   [name], DO Not Taking Active Family Member  vitamin C (ASCORBIC ACID) 500 MG tablet [id number] Yes Take 500 mg by mouth daily. [provider] Taking Active Family Member  zinc sulfate 220 (50 Zn) MG capsule [id number] Yes Take 1 capsule (220 mg total) by mouth daily. [name], MD Taking Active Family Member            Pertinent Labs:    Lab Results  Component Value Date   CREATININE 0.90 10/17/2020   BUN 21 10/17/2020   NA 141  10/17/2020   K 3.8 10/17/2020   CL 104 10/17/2020   CO2 30 10/17/2020   BP Readings from Last 3 Encounters:  01/19/21 (!) 166/84  12/26/20 (!) 152/82  12/21/20 111/73   Pulse Readings from Last 3 Encounters:  01/19/21 66  12/26/20 76  12/21/20 71     SDOH:  (Social Determinants of Health) assessments and interventions performed:    [place]  Review of patient past medical history, allergies, medications, health status, including review of consultants  reports, laboratory and other test data, was performed as part of comprehensive evaluation and provision of chronic care management services.   Care Plan : PharmD - Medication Managment  Updates made by [name], RPH-CPP since 02/23/2021 12:00 AM     Problem: Disease Progression      Long-Range Goal: Disease Progression Prevented or Minimized   Start Date: 01/19/2020  Expected End Date: 04/18/2020  Recent Progress: On track  Priority: High  Note:   Current Barriers:  Chronic Disease Management support, education, and care coordination needs related to hypertension, osteoarthritis, hyperlipidemia, insomnia and memory loss Cognitive Deficits  Pharmacist Clinical Goal(s):  Over the next 90 days, caregiver/patient will adhere to plan to optimize therapeutic regimen for hypertension as evidenced by report of adherence to recommended medication management changes through collaboration with PharmD and provider.   Interventions: 1:1 collaboration with [name], DO regarding development and update of comprehensive plan of care as evidenced by provider attestation and co-signature Inter-disciplinary care team collaboration (see longitudinal plan of care) Perform chart review Patient seen for Office Visit with [facility] on 01/19/2021 Today daughter reports she is working on finding patient zip-up compression stockings, as patient not consistently using compression socks due to difficulty with getting these on Reports patient is elevating his feet more throughout the day Encourage daughter to call to follow up with [facility] today to follow up regarding lymph pump recommended by provider on 1/13 Reports patient continues to work with home health speech therapy and physical therapy Comprehensive medication review performed; medication list updated in electronic medical record  Hypertension Patient followed by [facility] Reports  patient taking: Enalapril 5 mg daily in the morning Furosemide 20 mg on Mondays, Wednesdays and Fridays Reports has been monitoring home BP and keeping log, but daughter does not have record with her today Reports has been "good" recently, but does not recall specific readings Encourage caregiver to continue to monitor home BP, keep log, bring this record to medical appointments and contact office for readings outside of established parameters    Medication Adherence Note patient using weekly pillbox with slots for four times daily dosing Patient continues to need assistance from family for remembering four times/day dosing Have discussed with caregiver additional strategies for using medication alarm as adherence aid  Reports that she and her mom manage patient's as needed medications, including tramadol (but reports patient using tramadol rarely)  Patient Goals/Self-Care Activities Over the next 90 days, caregiver/patient will:  - take medications as prescribed - check blood pressure, document, and provide at future appointments  Follow Up Plan: Telephone follow up appointment with care management team member scheduled for: 04/11/2021 at 8:30 am      Wallace Cullens, PharmD, [place], [place] [phone]

## 2021-02-23 NOTE — Patient Instructions (Signed)
Visit Information  Thank you for taking time to visit with me today. Please don't hesitate to contact me if I can be of assistance to you before our next scheduled telephone appointment.  Following are the goals we discussed today:   Goals Addressed             This Visit's Progress    Pharmacy Goals       Please continue to monitor home blood pressure and keep log of results and have this record to review during our next appointment.   Feel free to call me with any questions or concerns. I look forward to our next call!  Wallace Cullens, PharmD, [place] [phone]         Our next appointment is by telephone on 04/11/2021 at 8:30 am  Please call the care guide team at [phone] if you need to cancel or reschedule your appointment.    Caregiver verbalizes understanding of instructions and care plan provided today and agrees to view in [place]. Active MyChart status confirmed with patient.    Wallace Cullens, PharmD, [place], CPP Clinical Pharmacist [facility] [phone]

## 2021-02-26 ENCOUNTER — Other Ambulatory Visit: Payer: Self-pay

## 2021-02-26 ENCOUNTER — Encounter: Payer: Self-pay | Admitting: Cardiology

## 2021-02-26 ENCOUNTER — Ambulatory Visit: Payer: Medicare Other | Admitting: Cardiology

## 2021-02-26 VITALS — BP 120/78 | HR 76 | Ht 72.0 in | Wt 208.0 lb

## 2021-02-26 DIAGNOSIS — E7800 Pure hypercholesterolemia, unspecified: Secondary | ICD-10-CM | POA: Diagnosis not present

## 2021-02-26 DIAGNOSIS — I10 Essential (primary) hypertension: Secondary | ICD-10-CM

## 2021-02-26 NOTE — Progress Notes (Signed)
Cardiology Office Note:    Date:  02/26/2021   ID:  [name], [name] [birth year], MRN [id number]  PCP:  [name], DO   [place] Providers Cardiologist:  Kate Sable, MD     Referring MD: [name] *   Chief Complaint  Patient presents with   Other    6 Month f/u no complaints today. Meds reviewed verbally with pt.    History of Present Illness:    Randy [name] is a 87 y.o. male with a hx of hypertension, hyperlipidemia, Lewy body dementia presenting for follow-up.    Patient is being seen due to hypertension, blood pressure has been adequately controlled.  He feels well, has no complaints or concerns at this time.  Takes medications as prescribed.  Prior notes Echo 3/89/3734, normal systolic function, EF 55 to 60%, impaired relaxation. Lexiscan Myoview with no evidence for ischemia.  Coronary artery calcification noted   Past Medical History:  Diagnosis Date   Arthritis    GERD (gastroesophageal reflux disease)    Hyperlipidemia    Hypertension    Lewy body dementia ([place])    Osteoporosis    Prostate enlargement     Past Surgical History:  Procedure Laterality Date   CHOLECYSTECTOMY N/A 09/10/2018   Procedure: LAPAROSCOPIC CHOLECYSTECTOMY;  Surgeon: Benjamine Sprague, DO;  Location: [facility] ORS;  Service: General;  Laterality: N/A;   CYSTOSCOPY WITH LITHOLAPAXY N/A 10/20/2020   Procedure: CYSTOSCOPY WITH LITHOLAPAXY;  Surgeon: [name], MD;  Location: [facility] ORS;  Service: Urology;  Laterality: N/A;   GANGLION CYST EXCISION Left 2013   Left upper arm   HERNIA REPAIR N/A 2008   [place], ventral acquired hernia repair   HOLEP-LASER ENUCLEATION OF THE PROSTATE WITH MORCELLATION N/A 11/06/2018   Procedure: HOLEP-LASER ENUCLEATION OF THE PROSTATE WITH MORCELLATION;  Surgeon: [name], MD;  Location: [facility] ORS;  Service: Urology;  Laterality: N/A;   HOLEP-LASER ENUCLEATION OF THE PROSTATE WITH MORCELLATION N/A 10/20/2020    Procedure: HOLEP-LASER ENUCLEATION OF THE PROSTATE WITH MORCELLATION;  Surgeon: [name], MD;  Location: [facility] ORS;  Service: Urology;  Laterality: N/A;    Current Medications: Current Meds  Medication Sig   acetaminophen (TYLENOL) 500 MG tablet Take 1,000 mg by mouth every 6 (six) hours as needed for moderate pain.   carbidopa-levodopa (SINEMET CR) 50-200 MG tablet Take 1 tablet by mouth 4 (four) times daily.   cholecalciferol (VITAMIN D) 1000 units tablet Take 1,000 Units by mouth daily.   Coenzyme Q10 (CO Q 10) 100 MG CAPS Take 100 mg by mouth daily.    diclofenac Sodium (VOLTAREN) 1 % GEL APPLY 2 G TOPICALLY 4 (FOUR) TIMES DAILY AS NEEDED. TO KNEE ARTHRITIS PAIN   enalapril (VASOTEC) 5 MG tablet Take 1 tablet (5 mg total) by mouth daily.   fluticasone (FLONASE) 50 MCG/ACT nasal spray Place 2 sprays into both nostrils as needed for allergies.   furosemide (LASIX) 20 MG tablet Take 1 tablet (20 mg total) by mouth every other day.   Multiple Vitamin (MULTIVITAMIN WITH MINERALS) TABS tablet Take 1 tablet by mouth daily.   Omega-3 Fatty Acids (FISH OIL) 1200 MG CAPS Take 1,200 mg by mouth in the morning, at noon, in the evening, and at bedtime.   omeprazole (PRILOSEC) 20 MG capsule Take 40 mg by mouth daily.   rivastigmine (EXELON) 3 MG capsule Take 3 mg by mouth 2 (two) times daily.   sertraline (ZOLOFT) 50 MG tablet Take 50 mg  by mouth at bedtime.   traMADol (ULTRAM) 50 MG tablet Take 1 tablet (50 mg total) by mouth every 8 (eight) hours as needed.   vitamin C (ASCORBIC ACID) 500 MG tablet Take 500 mg by mouth daily.   zinc sulfate 220 (50 Zn) MG capsule Take 1 capsule (220 mg total) by mouth daily.     Allergies:   Antihistamines, chlorpheniramine-type   Social History   Socioeconomic History   Marital status: Married    Spouse name: Not on file   Number of children: Not on file   Years of education: College   Highest education level: Not on file  Occupational History    Occupation: Retired Clinical biochemist  Tobacco Use   Smoking status: Former    Packs/day: 1.00    Years: 12.00    Pack years: 12.00    Types: Cigarettes    Quit date: 1968    Years since quitting: 55.1   Smokeless tobacco: Never  Vaping Use   Vaping Use: Never used  Substance and Sexual Activity   Alcohol use: No   Drug use: No   Sexual activity: Not Currently  Other Topics Concern   Not on file  Social History Narrative   Not on file   Social Determinants of Health   Financial Resource Strain: Low Risk    Difficulty of Paying Living Expenses: Not hard at all  Food Insecurity: No Food Insecurity   Worried About Charity fundraiser in the Last Year: Never true   Arboriculturist in the Last Year: Never true  Transportation Needs: No Transportation Needs   Lack of Transportation (Medical): No   Lack of Transportation (Non-Medical): No  Physical Activity: Inactive   Days of Exercise per Week: 0 days   Minutes of Exercise per Session: 0 min  Stress: No Stress Concern Present   Feeling of Stress : Not at all  Social Connections: Socially Integrated   Frequency of Communication with Friends and Family: More than three times a week   Frequency of Social Gatherings with Friends and Family: More than three times a week   Attends Religious Services: More than 4 times per year   Active Member of [employer] or Organizations: Yes   Attends Music therapist: More than 4 times per year   Marital Status: Married     Family History: The patient's family history includes Breast cancer in his mother; Heart disease in his brother; Leukemia in his brother; Lung cancer in his father. There is no history of Prostate cancer, Colon cancer, Bladder Cancer, or Kidney cancer.  ROS:   Please see the history of present illness.     All other systems reviewed and are negative.  EKGs/Labs/Other Studies Reviewed:    The following studies were reviewed today:   EKG:  EKG not ordered today.    Recent Labs: 05/09/2020: ALT 5; B Natriuretic Peptide 211.3 10/17/2020: BUN 21; Creatinine, Ser 0.90; Hemoglobin 12.3; Platelets 182; Potassium 3.8; Sodium 141  Recent Lipid Panel    Component Value Date/Time   CHOL 156 07/17/2020 0851   CHOL 151 04/17/2015 0826   TRIG 71 07/17/2020 0851   TRIG 116 04/17/2015 0826   HDL 37 (L) 07/17/2020 0851   HDL 32 (A) 04/17/2015 0826   CHOLHDL 4.2 07/17/2020 0851   VLDL 14 07/17/2020 0851   LDLCALC 105 (H) 07/17/2020 0851   LDLCALC 121 (H) 06/10/2018 0800     Risk Assessment/Calculations:  Physical Exam:    VS:  BP 120/78 (BP Location: Left Arm, Patient Position: Sitting, Cuff Size: Normal)    Pulse 76    Ht 6' (1.829 m)    Wt 208 lb (94.3 kg)    SpO2 93%    BMI 28.21 kg/m     Wt Readings from Last 3 Encounters:  02/26/21 208 lb (94.3 kg)  01/19/21 209 lb 6.4 oz (95 kg)  12/26/20 208 lb (94.3 kg)     GEN:  Well nourished, well developed in no acute distress HEENT: Normal NECK: No JVD; No carotid bruits LYMPHATICS: No lymphadenopathy CARDIAC: RRR, no murmurs, rubs, gallops RESPIRATORY:  Clear anteriorly, diminished breath sounds at bases ABDOMEN: Soft, non-tender, non-distended MUSCULOSKELETAL:  1+ nonpitting edema; varicose veins, brown skin discoloration  SKIN: Warm and dry NEUROLOGIC:  Alert and oriented x 3 PSYCHIATRIC:  Normal affect   ASSESSMENT:    1. Primary hypertension   2. Pure hypercholesterolemia    PLAN:    In order of problems listed above:  Hypertension, BP controlled.  Continue enalapril. Hyperlipidemia, LDL 105.  Due to patient's age, low-cholesterol diet reasonable.    Follow-up yearly or prn   Medication Adjustments/Labs and Tests Ordered: Current medicines are reviewed at length with the patient today.  Concerns regarding medicines are outlined above.  Orders Placed This Encounter  Procedures   EKG 12-Lead     No orders of the defined types were placed in this  encounter.     Patient Instructions  Medication Instructions:  - Your physician recommends that you continue on your current medications as directed. Please refer to the Current Medication list given to you today.  *If you need a refill on your cardiac medications before your next appointment, please call your pharmacy*   Lab Work: - none ordered  If you have labs (blood work) drawn today and your tests are completely normal, you will receive your results only by: [place] (if you have MyChart) OR A paper copy in the mail If you have any lab test that is abnormal or we need to change your treatment, we will call you to review the results.   Testing/Procedures: - none ordered   Follow-Up: At [facility], you and your health needs are our priority.  As part of our continuing mission to provide you with exceptional heart care, we have created designated Provider Care Teams.  These Care Teams include your primary Cardiologist (physician) and Advanced Practice Providers (APPs -  Physician Assistants and Nurse Practitioners) who all work together to provide you with the care you need, when you need it.  We recommend signing up for the patient portal called "MyChart".  Sign up information is provided on this After Visit Summary.  MyChart is used to connect with patients for Virtual Visits (Telemedicine).  Patients are able to view lab/test results, encounter notes, upcoming appointments, etc.  Non-urgent messages can be sent to your provider as well.   To learn more about what you can do with MyChart, go to [email or web address].    Your next appointment:   1 year(s)  The format for your next appointment:   In Person  Provider:   Kate Sable, MD    Other Instructions N/a    Signed, Kate Sable, MD  02/26/2021 4:19 PM    [place]

## 2021-02-26 NOTE — Patient Instructions (Signed)
Medication Instructions:  - Your physician recommends that you continue on your current medications as directed. Please refer to the Current Medication list given to you today.  *If you need a refill on your cardiac medications before your next appointment, please call your pharmacy*   Lab Work: - none ordered  If you have labs (blood work) drawn today and your tests are completely normal, you will receive your results only by: [place] (if you have MyChart) OR A paper copy in the mail If you have any lab test that is abnormal or we need to change your treatment, we will call you to review the results.   Testing/Procedures: - none ordered   Follow-Up: At [facility], you and your health needs are our priority.  As part of our continuing mission to provide you with exceptional heart care, we have created designated Provider Care Teams.  These Care Teams include your primary Cardiologist (physician) and Advanced Practice Providers (APPs -  Physician Assistants and Nurse Practitioners) who all work together to provide you with the care you need, when you need it.  We recommend signing up for the patient portal called "MyChart".  Sign up information is provided on this After Visit Summary.  MyChart is used to connect with patients for Virtual Visits (Telemedicine).  Patients are able to view lab/test results, encounter notes, upcoming appointments, etc.  Non-urgent messages can be sent to your provider as well.   To learn more about what you can do with MyChart, go to [email or web address].    Your next appointment:   1 year(s)  The format for your next appointment:   In Person  Provider:   Kate Sable, MD    Other Instructions N/a

## 2021-03-06 DIAGNOSIS — I10 Essential (primary) hypertension: Secondary | ICD-10-CM

## 2021-03-12 ENCOUNTER — Telehealth: Payer: Medicare Other

## 2021-03-12 ENCOUNTER — Ambulatory Visit (INDEPENDENT_AMBULATORY_CARE_PROVIDER_SITE_OTHER): Payer: Medicare Other

## 2021-03-12 DIAGNOSIS — I872 Venous insufficiency (chronic) (peripheral): Secondary | ICD-10-CM

## 2021-03-12 DIAGNOSIS — R413 Other amnesia: Secondary | ICD-10-CM

## 2021-03-12 DIAGNOSIS — N3941 Urge incontinence: Secondary | ICD-10-CM

## 2021-03-12 DIAGNOSIS — E782 Mixed hyperlipidemia: Secondary | ICD-10-CM

## 2021-03-12 DIAGNOSIS — D492 Neoplasm of unspecified behavior of bone, soft tissue, and skin: Secondary | ICD-10-CM

## 2021-03-12 DIAGNOSIS — I10 Essential (primary) hypertension: Secondary | ICD-10-CM

## 2021-03-12 DIAGNOSIS — K219 Gastro-esophageal reflux disease without esophagitis: Secondary | ICD-10-CM

## 2021-03-12 DIAGNOSIS — F0280 Dementia in other diseases classified elsewhere without behavioral disturbance: Secondary | ICD-10-CM

## 2021-03-12 DIAGNOSIS — G3183 Dementia with Lewy bodies: Secondary | ICD-10-CM

## 2021-03-12 NOTE — Chronic Care Management (AMB) (Signed)
Chronic Care Management   CCM RN Visit Note  03/12/2021 Name: Randy [name] MRN: [id number] DOB: 1934/12/11  Subjective: Randy [name] is a 87 y.o. year old male who is a primary care patient of Randy Hauser, DO. The care management team was consulted for assistance with disease management and care coordination needs.    Engaged with patient by telephone for follow up visit in response to provider referral for case management and/or care coordination services. Spoke with the patient's daughter and Randy [name] on behalf of the patient.  Consent to Services:  The patient was given information about Chronic Care Management services, agreed to services, and gave verbal consent prior to initiation of services.  Please see initial visit note for detailed documentation.   Patient agreed to services and verbal consent obtained.   Assessment: Review of patient past medical history, allergies, medications, health status, including review of consultants reports, laboratory and other test data, was performed as part of comprehensive evaluation and provision of chronic care management services.   SDOH (Social Determinants of Health) assessments and interventions performed:    CCM Care Plan  Allergies  Allergen Reactions   Antihistamines, Chlorpheniramine-Type Other (See Comments)    Can't urinate    Outpatient Encounter Medications as of 03/12/2021  Medication Sig   acetaminophen (TYLENOL) 500 MG tablet Take 1,000 mg by mouth every 6 (six) hours as needed for moderate pain.   carbidopa-levodopa (SINEMET CR) 50-200 MG tablet Take 1 tablet by mouth 4 (four) times daily.   cholecalciferol (VITAMIN D) 1000 units tablet Take 1,000 Units by mouth daily.   Coenzyme Q10 (CO Q 10) 100 MG CAPS Take 100 mg by mouth daily.    diclofenac Sodium (VOLTAREN) 1 % GEL APPLY 2 G TOPICALLY 4 (FOUR) TIMES DAILY AS NEEDED. TO KNEE ARTHRITIS PAIN   enalapril (VASOTEC) 5 MG tablet Take 1 tablet (5 mg total)  by mouth daily.   fluticasone (FLONASE) 50 MCG/ACT nasal spray Place 2 sprays into both nostrils as needed for allergies.   furosemide (LASIX) 20 MG tablet Take 1 tablet (20 mg total) by mouth every other day.   Multiple Vitamin (MULTIVITAMIN WITH MINERALS) TABS tablet Take 1 tablet by mouth daily.   Omega-3 Fatty Acids (FISH OIL) 1200 MG CAPS Take 1,200 mg by mouth in the morning, at noon, in the evening, and at bedtime.   omeprazole (PRILOSEC) 20 MG capsule Take 40 mg by mouth daily.   rivastigmine (EXELON) 3 MG capsule Take 3 mg by mouth 2 (two) times daily.   sertraline (ZOLOFT) 50 MG tablet Take 50 mg by mouth at bedtime.   traMADol (ULTRAM) 50 MG tablet Take 1 tablet (50 mg total) by mouth every 8 (eight) hours as needed.   vitamin C (ASCORBIC ACID) 500 MG tablet Take 500 mg by mouth daily.   zinc sulfate 220 (50 Zn) MG capsule Take 1 capsule (220 mg total) by mouth daily.   No facility-administered encounter medications on file as of 03/12/2021.    Patient Active Problem List   Diagnosis Date Noted   Swelling of limb 12/26/2020   Bilateral primary osteoarthritis of knee 02/15/2020   Pressure injury of buttock, stage 1    Delirium    COVID-19 virus infection 10/08/2019   Unable to care for self 10/08/2019   Caregiver unable to cope 10/08/2019   Recurrent falls 10/08/2019   Lewy body dementia without behavioral disturbance Randy [name])    Prostate cancer ([place]) 06/30/2019  BPH with urinary obstruction 11/06/2018   Acute cholecystitis 09/08/2018   Myalgia due to statin 02/24/2018   Tricompartment osteoarthritis of left knee 11/14/2017   Generalized weakness 04/28/2017   Chronic low back pain 03/12/2017   Knee pain, chronic 10/01/2016   Chronic venous insufficiency 08/30/2016   BPH without obstruction/lower urinary tract symptoms 03/06/2016   Osteoarthritis of multiple joints 03/06/2016   Degenerative joint disease (DJD) of lumbar spine 03/06/2016   Essential hypertension 03/05/2016    Hyperlipidemia 03/05/2016   Osteoporosis 03/05/2016   GERD (gastroesophageal reflux disease) 03/05/2016   Bilateral lower extremity edema 03/05/2016   Dupuytren's disease of palm 05/28/2015    Conditions to be addressed/monitored:HTN, HLD, Dementia, GERD, and urinary incontinence, growth to left arm, venous insufficiency   Care Plan : RNCM: General Plan of Care (Adult) For Chronic Disease Management and Care Coordination Needs  Updates made by Vanita Ingles, RN since 03/12/2021 12:00 AM     Problem: RNCM; Development of Plan of Care for Chronic Disease Management ( lewy body dementia, HTN, HLD, Urinary Incontinence)   Priority: High     Long-Range Goal: RNCM; Effective Management  of Plan of Care for Chronic Disease Management ( lewy body dementia, HTN, HLD, Urinary Incontinence)   Start Date: 11/13/2020  Expected End Date: 11/13/2021  Priority: High  Note:   Current Barriers:  Knowledge Deficits related to plan of care for management of HTN, HLD, and Demential and Urinary Incontinence with enlarged prostate  Care Coordination needs related to Memory Deficits and family support and education due to Lewy Body Dementia  Chronic Disease Management support and education needs related to HTN, HLD, and Dementia and urinary Incontinence  Cognitive Deficits  RNCM Clinical Goal(s):  Patient will verbalize understanding of plan for management of HTN, HLD, Dementia, and Urinary Incontinence with recent bladder stone as evidenced by effective management of chronic condtions without any exacerbations and maintaining health and well being take all medications exactly as prescribed and will call provider for medication related questions as evidenced by compliance with medications and working with the pharm D for questions or concerns related to medications    attend all scheduled medical appointments: no upcoming with pcp but has with specialist  as evidenced by keeping appointments and calling the  office for changes in condition or new concerns that need to be addressed         continue to work with RN Care Manager and/or Social Worker to address care management and care coordination needs related to HTN, HLD, Dementia, and Urinary incontinence  as evidenced by adherence to CM Team Scheduled appointments     demonstrate a decrease in Urinary incontinence with recent bladder stone, and changes in memory due to lewy body demential exacerbations  as evidenced by stable conditions, no acute exacerbations and working with the CCM team to manage health and well being demonstrate ongoing self health care management ability of effective management of chronic conditions as evidenced by  working with the CCM team  through collaboration with Consulting civil engineer, provider, and care team.   Interventions: 1:1 collaboration with primary care provider regarding development and update of comprehensive plan of care as evidenced by provider attestation and co-signature Inter-disciplinary care team collaboration (see longitudinal plan of care) Evaluation of current treatment plan related to  self management and patient's adherence to plan as established by provider   SDOH Barriers (Status: Goal on Track (progressing): YES.) Long Term Goal  Patient interviewed and SDOH assessment performed  Patient interviewed and appropriate assessments performed Provided patient with information about resources in the county and care guides avaialble to assist with changes in SDOH or new needs as they arise Discussed plans with patient for ongoing care management follow up and provided patient with direct contact information for care management team Advised patient to call the office for changes in SDOH, Question, or Concerns Provided education to patient/caregiver regarding level of care options.    Urinary Incontinence with enlarged prostate and recent bladder stone  (Status: Goal Met. No Needs Identified this visit)  Long Term Goal Closing this goal and will re-visit if new concerns 03-12-2021 Evaluation of current treatment plan related to  Urinary incontinence with enlarged prostate and recent bladder stone  , Level of care concerns and Memory Deficits self-management and patient's adherence to plan as established by provider. 11-13-2020: The patient recently had bladder stone removed on 10-20-2020. The patients daughter states that since removal of the bladder stone the patient denies any pain on urination and states he feels better. The daughter has taken the patient to follow ups with urologist. Review of sx and sx to monitor for changes in urinary health and well being. 01-15-2021: The patients daughter states that he is doing well since the procedure last year. He has not had anymore complaints or issues related to urinary retention. Will continue to monitor for changes.  Discussed plans with patient for ongoing care management follow up and provided patient with direct contact information for care management team Advised patient to call the office for changes in urinary health, questions or concerns; Provided education to patient re: monitoring for sx and sx of UTI, increased pain on urinarion, new concerns related to urinary incontinence. 01-15-2021: Review and the patient or daughter denies any issues related to urinary retention or concerns ; Reviewed medications with patient and discussed compliance, the wife and daughter assist with medications management. 01-15-2021: The daughter states compliance with medications  ; Reviewed scheduled/upcoming provider appointments including Decemember 2022, with Urology specialist  The patien thas no appointment with the pcp coming up but education provided to the daughter to call the office for changes. 01-15-2021: Follows up with the provider as needed. Denies any new concerns with urinary retention; Discussed plans with patient for ongoing care management follow up and provided  patient with direct contact information for care management team; Advised patient to discuss new development of urinary sx and sx with provider; Screening for signs and symptoms of depression related to chronic disease state;  Assessed social determinant of health barriers;   Hyperlipidemia:  (Status: Goal on Track (progressing): YES.) Lab Results  Component Value Date   CHOL 156 07/17/2020   HDL 37 (L) 07/17/2020   LDLCALC 105 (H) 07/17/2020   TRIG 71 07/17/2020   CHOLHDL 4.2 07/17/2020     Medication review performed; medication list updated in electronic medical record.  Provider established cholesterol goals reviewed; Counseled on importance of regular laboratory monitoring as prescribed. 03-12-2021: Review of the need to have regular lab work; Provided HLD Scientist, clinical (histocompatibility and immunogenetics); Reviewed role and benefits of statin for ASCVD risk reduction; Discussed strategies to manage statin-induced myalgias; Reviewed importance of limiting foods high in cholesterol. 01-15-2021: The patients daughter states that the patient is not eating as well as he once was. She states partly because she things he does not want to prepare it or he forgets. His wife makes sure he eats when she is up but her daughter is working a lot when he  is up and doing daily activities. Education and support given. 03-12-2021: The patient is eating well now. Does complain some of pain after eating. Discussed eating slow, sitting upright when eating and not laying down for 30 to one hour after eating to prevent issues with GERD. Education and support given.   Hypertension: (Status: Goal on Track (progressing): YES.) Last practice recorded BP readings:  BP Readings from Last 3 Encounters:  02/26/21 120/78  01/19/21 (!) 166/84  12/26/20 (!) 152/82  Most recent eGFR/CrCl: No results found for: EGFR  No components found for: CRCL  Evaluation of current treatment plan related to hypertension self management and patient's adherence to  plan as established by provider. 11-13-2020: The patient has long standing issues with his blood pressures fluctuating. The patient does take his blood pressures at home. The patients daughter and wife assist with his care. States compliance with medications. Will continue to monitor. 01-15-2021: The patient is still having fluctuations in his blood pressures. The patient saw Dr. Lucky Cowboy recently for venous insufficiency and will have further testing on 01-19-2021: The patients daughter states his right leg is more swollen that the left. See new disease process under venous insufficiency.   03-12-2021: The patient saw the cardiologist recently and got a good check up. Does not have to see cardiology for one year. The patient did see vascular specialist a few months ago and it was recommended the patient get a lymphedema machine. The daughter is going to follow up on this as she has not heard back from the provider on this. Education and support given.  Reviewed prescribed diet heart healthy diet. 03-12-2021: Review of heart healthy diet Reviewed medications with patient and discussed importance of compliance. 03-12-2021: Review of medications compliance and the patients daughter verbalized compliance. No recent changes in medications.   Discussed plans with patient for ongoing care management follow up and provided patient with direct contact information for care management team; Advised patient, providing education and rationale, to monitor blood pressure daily and record, calling PCP for findings outside established parameters;  Advised patient to discuss blood pressure trends  with provider; Provided education on prescribed diet heart healthy diet ;  Discussed complications of poorly controlled blood pressure such as heart disease, stroke, circulatory complications, vision complications, kidney impairment, sexual dysfunction;    Venous Insufficiency   (Status: Goal on Track (progressing): YES.) Long Term Goal   Evaluation of current treatment plan related to  Venous Insufficiency ,  self-management and patient's adherence to plan as established by provider. 03-12-2021: The patient is still having edema in his legs. The daughter states they have not heard back from the lymph edema machine and she will call and follow up on this. The patient does have pain and discomfort in his legs. Education and support given.  Discussed plans with patient for ongoing care management follow up and provided patient with direct contact information for care management team Advised patient to call the office for increased swelling, broken areas, or changes in temperature to the extremities; Provided education to patient re: venous insufficiency and sx and sx of decreased circulations, factors that the patient needs to alert the provider, and keeping appointments with the provider for further testing and evaluation; Reviewed medications with patient and discussed compliance. 03-12-2021: The patient is compliant with medications ; Provided patient with ways to decrease swelling  educational materials related to venous insufficiency and changes in circulation to extremities; Reviewed scheduled/upcoming provider appointments including has ultrasound on bilateral legs this  week 01-19-2021 and will follow up with Dr. Lucky Cowboy after the ultrasound. Knows to call the office for changes in conditions, questions, or concerns. Saw cardiologist on 02-26-2021 and got a good report. Daughter will follow up with the vascular provider for updated information on lymph edema machine to help with swelling in the patients legs.; Discussed plans with patient for ongoing care management follow up and provided patient with direct contact information for care management team; Screening for signs and symptoms of depression related to chronic disease state;  Assessed social determinant of health barriers;      Lewy Body Dementia   (Status: Goal on Track  (progressing): YES.) Long Term Goal  Evaluation of current treatment plan related to Dementia, Level of care concerns and Memory Deficits self-management and patient's adherence to plan as established by provider. 11-13-2020: The patient saw the neurologist recently and had changes in medications. A consult for speech therapy was made. The patients daughter states that she has not heard from them yet. Sometimes the patient has a hard time with his words and needs. Education and support given. Will continue to monitor. 01-15-2021: The patient had changes in medications and therefore it caused some nausea with vomiting. The patient saw the pcp in December and had an increase in his Omeprazole to 40 mg daily. Since the increase the patients daughter states he has hand no further episodes of nausea with vomiting. The patient is stable as far as his memory and cognition. The daughter knows to call for changes, questions, or concerns. 03-12-2021: The patients daughter states this is stable. The patient has good days and bad days. Is not as mobile and sometimes has a better memory than other days. Does not go outside a lot. Denies any acute findings related to dementia and changes.  Discussed plans with patient for ongoing care management follow up and provided patient with direct contact information for care management team Advised patient to call the office for changes in memory, depression, anxiety, mood; Provided education to patient re: The 36 hour day resources for caregivers that have loved ones with dementia; Reviewed medications with patient and discussed compliance. The patient saw the neurologist recently and his rivastigmine was increased from the original 1.5 mg BID. 01-15-2021: The Rivastigmine is working well for the patient but he was having new onset of nausea with vomiting. The patients daughter states that since the Omeprazole was increased to 40 mg that the patient is doing better and no other concerns  with N&V. 03-12-2021: Is compliant with medications. Provided patient with resources to help with understanding Lewy body dementia educational materials related to progression of lewy body dementia; Reviewed scheduled/upcoming provider appointments including saw neurologist recently and keeps appointments as scheduled, knows to call the pcp office for new needs or concerns. 01-15-2021: The patients daughter knows to call the office for changes in conditions, questions, or concerns. The patient sees the specialist on a regular basis.; Discussed plans with patient for ongoing care management follow up and provided patient with direct contact information for care management team; Advised patient to discuss progression and changes with provider; Screening for signs and symptoms of depression related to chronic disease state;  Assessed social determinant of health barriers;     GERD  (Status: Goal on Track (progressing): YES.) Long Term Goal  Evaluation of current treatment plan related to GERD,  self-management and patient's adherence to plan as established by provider. Discussed plans with patient for ongoing care management follow up and provided  patient with direct contact information for care management team Advised patient to sit upright when eating, do not lay down 30 minutes to an hour after eating, take medications as directed, avoid spicy foods to prevent exacerbation of GERD. The daughter states the patient has been complaining of his stomach hurting after eating; Provided education to patient re: GERD exacerbations and factors that cause exacerbation, changes to monitor for, precautions and safety; Reviewed medications with patient and discussed compliance. The patients daughter states the patient is compliant with medications.; Discussed plans with patient for ongoing care management follow up and provided patient with direct contact information for care management team; Advised patient to  discuss changes in eating habits, changes in sx and sx associated with GERD, Questions and concerns with provider; Screening for signs and symptoms of depression related to chronic disease state;  Assessed social determinant of health barriers;     Growth on Left arm  (Status: New goal.) Short Term Goal  Evaluation of current treatment plan related to  Growth on left arm ,  self-management and patient's adherence to plan as established by provider. Discussed plans with patient for ongoing care management follow up and provided patient with direct contact information for care management team Advised patient to call the office for changes in area, questions and concerns about the growth; Provided education to patient re: left arm growth, review of sx and sx presented, if pain is present, previous interventions from several years ago; Collaborated with pcp regarding recommendations for evaluation and treatment options to the left arm-growth. In basket message sent to the pcp asking for recommendations for left arm growth. The patients daughter states the area is the size of an orange and getting bigger the patient says it hurts sometimes. The patient had surgical intervention before but it has been several years. The patients daughter is asking for pcp recommendations. Will collaborate with the pcp and get back with the patients daughter. Call made back to the daughter after collaboration with the pcp and gave options. [facility] NP Ralene Bathe will see the patient next week and can collaborate back with the pcp and get a referral. The daughter agrees with this plan and will follow up accordingly. Will send a message to NP Ralene Bathe with [facility] and let pcp know the desires of the patient and patients daughter. ; Discussed plans with patient for ongoing care management follow up and provided patient with direct contact information for care management team; Advised patient to discuss growth to the left  arm with provider;   Patient Goals/Self-Care Activities: Patient will self administer medications as prescribed as evidenced by self report/primary caregiver report  Patient will attend all scheduled provider appointments as evidenced by clinician review of documented attendance to scheduled appointments and patient/caregiver report Patient will call pharmacy for medication refills as evidenced by patient report and review of pharmacy fill history as appropriate Patient will attend church or other social activities as evidenced by patient report Patient will continue to perform ADL's independently as evidenced by patient/caregiver report Patient will call provider office for new concerns or questions as evidenced by review of documented incoming telephone call notes and patient report Patient will work with BSW to address care coordination needs and will continue to work with the clinical team to address health care and disease management related needs as evidenced by documented adherence to scheduled care management/care coordination appointments - check blood pressure 3 times per week - choose a place to take my blood pressure (  home, clinic or office, retail store) - write blood pressure results in a log or diary - learn about high blood pressure - keep a blood pressure log - take blood pressure log to all doctor appointments - call doctor for signs and symptoms of high blood pressure - develop an action plan for high blood pressure - keep all doctor appointments - take medications for blood pressure exactly as prescribed - report new symptoms to your doctor - eat more whole grains, fruits and vegetables, lean meats and healthy fats - call for medicine refill 2 or 3 days before it runs out - take all medications exactly as prescribed - call doctor with any symptoms you believe are related to your medicine - call doctor when you experience any new symptoms - go to all doctor appointments  as scheduled - adhere to prescribed diet: Heart Healthy Diet       Plan:Telephone follow up appointment with care management team member scheduled for:  04-23-2021 at [place] am  [place], MSN, [place] Mobile: [phone]

## 2021-03-12 NOTE — Patient Instructions (Signed)
Visit Information  Thank you for taking time to visit with me today. Please don't hesitate to contact me if I can be of assistance to you before our next scheduled telephone appointment.  Following are the goals we discussed today:  RNCM Clinical Goal(s):  Patient will verbalize understanding of plan for management of HTN, HLD, Dementia, and Urinary Incontinence with recent bladder stone as evidenced by effective management of chronic condtions without any exacerbations and maintaining health and well being take all medications exactly as prescribed and will call provider for medication related questions as evidenced by compliance with medications and working with the pharm D for questions or concerns related to medications    attend all scheduled medical appointments: no upcoming with pcp but has with specialist  as evidenced by keeping appointments and calling the office for changes in condition or new concerns that need to be addressed         continue to work with [facility] and/or Social Worker to address care management and care coordination needs related to HTN, HLD, Dementia, and Urinary incontinence  as evidenced by adherence to CM Team Scheduled appointments     demonstrate a decrease in Urinary incontinence with recent bladder stone, and changes in memory due to lewy body demential exacerbations  as evidenced by stable conditions, no acute exacerbations and working with the CCM team to manage health and well being demonstrate ongoing self health care management ability of effective management of chronic conditions as evidenced by  working with the CCM team  through collaboration with Consulting civil engineer, provider, and care team.    Interventions: 1:1 collaboration with primary care provider regarding development and update of comprehensive plan of care as evidenced by provider attestation and co-signature Inter-disciplinary care team collaboration (see longitudinal plan of  care) Evaluation of current treatment plan related to  self management and patient's adherence to plan as established by provider     SDOH Barriers (Status: Goal on Track (progressing): YES.) Long Term Goal  Patient interviewed and SDOH assessment performed        Patient interviewed and appropriate assessments performed Provided patient with information about resources in the county and care guides avaialble to assist with changes in SDOH or new needs as they arise Discussed plans with patient for ongoing care management follow up and provided patient with direct contact information for care management team Advised patient to call the office for changes in SDOH, Question, or Concerns Provided education to patient/caregiver regarding level of care options.       Urinary Incontinence with enlarged prostate and recent bladder stone  (Status: Goal Met. No Needs Identified this visit) Long Term Goal Closing this goal and will re-visit if new concerns 03-12-2021 Evaluation of current treatment plan related to  Urinary incontinence with enlarged prostate and recent bladder stone  , Level of care concerns and Memory Deficits self-management and patient's adherence to plan as established by provider. 11-13-2020: The patient recently had bladder stone removed on 10-20-2020. The patients daughter states that since removal of the bladder stone the patient denies any pain on urination and states he feels better. The daughter has taken the patient to follow ups with urologist. Review of sx and sx to monitor for changes in urinary health and well being. 01-15-2021: The patients daughter states that he is doing well since the procedure last year. He has not had anymore complaints or issues related to urinary retention. Will continue to monitor for changes.  Discussed  plans with patient for ongoing care management follow up and provided patient with direct contact information for care management team Advised patient to  call the office for changes in urinary health, questions or concerns; Provided education to patient re: monitoring for sx and sx of UTI, increased pain on urinarion, new concerns related to urinary incontinence. 01-15-2021: Review and the patient or daughter denies any issues related to urinary retention or concerns ; Reviewed medications with patient and discussed compliance, the wife and daughter assist with medications management. 01-15-2021: The daughter states compliance with medications  ; Reviewed scheduled/upcoming provider appointments including Decemember 2022, with Urology specialist  The patien thas no appointment with the pcp coming up but education provided to the daughter to call the office for changes. 01-15-2021: Follows up with the provider as needed. Denies any new concerns with urinary retention; Discussed plans with patient for ongoing care management follow up and provided patient with direct contact information for care management team; Advised patient to discuss new development of urinary sx and sx with provider; Screening for signs and symptoms of depression related to chronic disease state;  Assessed social determinant of health barriers;    Hyperlipidemia:  (Status: Goal on Track (progressing): YES.)      Lab Results  Component Value Date    CHOL 156 07/17/2020    HDL 37 (L) 07/17/2020    LDLCALC 105 (H) 07/17/2020    TRIG 71 07/17/2020    CHOLHDL 4.2 07/17/2020      Medication review performed; medication list updated in electronic medical record.  Provider established cholesterol goals reviewed; Counseled on importance of regular laboratory monitoring as prescribed. 03-12-2021: Review of the need to have regular lab work; Provided HLD Scientist, clinical (histocompatibility and immunogenetics); Reviewed role and benefits of statin for ASCVD risk reduction; Discussed strategies to manage statin-induced myalgias; Reviewed importance of limiting foods high in cholesterol. 01-15-2021: The patients daughter states  that the patient is not eating as well as he once was. She states partly because she things he does not want to prepare it or he forgets. His wife makes sure he eats when she is up but her daughter is working a lot when he is up and doing daily activities. Education and support given. 03-12-2021: The patient is eating well now. Does complain some of pain after eating. Discussed eating slow, sitting upright when eating and not laying down for 30 to one hour after eating to prevent issues with GERD. Education and support given.    Hypertension: (Status: Goal on Track (progressing): YES.) Last practice recorded BP readings:     BP Readings from Last 3 Encounters:  02/26/21 120/78  01/19/21 (!) 166/84  12/26/20 (!) 152/82  Most recent eGFR/CrCl: No results found for: EGFR  No components found for: CRCL   Evaluation of current treatment plan related to hypertension self management and patient's adherence to plan as established by provider. 11-13-2020: The patient has long standing issues with his blood pressures fluctuating. The patient does take his blood pressures at home. The patients daughter and wife assist with his care. States compliance with medications. Will continue to monitor. 01-15-2021: The patient is still having fluctuations in his blood pressures. The patient saw Dr. Lucky Cowboy recently for venous insufficiency and will have further testing on 01-19-2021: The patients daughter states his right leg is more swollen that the left. See new disease process under venous insufficiency.   03-12-2021: The patient saw the cardiologist recently and got a good check up. Does not have  to see cardiology for one year. The patient did see vascular specialist a few months ago and it was recommended the patient get a lymphedema machine. The daughter is going to follow up on this as she has not heard back from the provider on this. Education and support given.  Reviewed prescribed diet heart healthy diet. 03-12-2021: Review of  heart healthy diet Reviewed medications with patient and discussed importance of compliance. 03-12-2021: Review of medications compliance and the patients daughter verbalized compliance. No recent changes in medications.   Discussed plans with patient for ongoing care management follow up and provided patient with direct contact information for care management team; Advised patient, providing education and rationale, to monitor blood pressure daily and record, calling PCP for findings outside established parameters;  Advised patient to discuss blood pressure trends  with provider; Provided education on prescribed diet heart healthy diet ;  Discussed complications of poorly controlled blood pressure such as heart disease, stroke, circulatory complications, vision complications, kidney impairment, sexual dysfunction;      Venous Insufficiency   (Status: Goal on Track (progressing): YES.) Long Term Goal  Evaluation of current treatment plan related to  Venous Insufficiency ,  self-management and patient's adherence to plan as established by provider. 03-12-2021: The patient is still having edema in his legs. The daughter states they have not heard back from the lymph edema machine and she will call and follow up on this. The patient does have pain and discomfort in his legs. Education and support given.  Discussed plans with patient for ongoing care management follow up and provided patient with direct contact information for care management team Advised patient to call the office for increased swelling, broken areas, or changes in temperature to the extremities; Provided education to patient re: venous insufficiency and sx and sx of decreased circulations, factors that the patient needs to alert the provider, and keeping appointments with the provider for further testing and evaluation; Reviewed medications with patient and discussed compliance. 03-12-2021: The patient is compliant with medications ; Provided  patient with ways to decrease swelling  educational materials related to venous insufficiency and changes in circulation to extremities; Reviewed scheduled/upcoming provider appointments including has ultrasound on bilateral legs this week 01-19-2021 and will follow up with Dr. Lucky Cowboy after the ultrasound. Knows to call the office for changes in conditions, questions, or concerns. Saw cardiologist on 02-26-2021 and got a good report. Daughter will follow up with the vascular provider for updated information on lymph edema machine to help with swelling in the patients legs.; Discussed plans with patient for ongoing care management follow up and provided patient with direct contact information for care management team; Screening for signs and symptoms of depression related to chronic disease state;  Assessed social determinant of health barriers;         Lewy Body Dementia   (Status: Goal on Track (progressing): YES.) Long Term Goal  Evaluation of current treatment plan related to Dementia, Level of care concerns and Memory Deficits self-management and patient's adherence to plan as established by provider. 11-13-2020: The patient saw the neurologist recently and had changes in medications. A consult for speech therapy was made. The patients daughter states that she has not heard from them yet. Sometimes the patient has a hard time with his words and needs. Education and support given. Will continue to monitor. 01-15-2021: The patient had changes in medications and therefore it caused some nausea with vomiting. The patient saw the pcp in December and had  an increase in his Omeprazole to 40 mg daily. Since the increase the patients daughter states he has hand no further episodes of nausea with vomiting. The patient is stable as far as his memory and cognition. The daughter knows to call for changes, questions, or concerns. 03-12-2021: The patients daughter states this is stable. The patient has good days and bad days.  Is not as mobile and sometimes has a better memory than other days. Does not go outside a lot. Denies any acute findings related to dementia and changes.  Discussed plans with patient for ongoing care management follow up and provided patient with direct contact information for care management team Advised patient to call the office for changes in memory, depression, anxiety, mood; Provided education to patient re: The 36 hour day resources for caregivers that have loved ones with dementia; Reviewed medications with patient and discussed compliance. The patient saw the neurologist recently and his rivastigmine was increased from the original 1.5 mg BID. 01-15-2021: The Rivastigmine is working well for the patient but he was having new onset of nausea with vomiting. The patients daughter states that since the Omeprazole was increased to 40 mg that the patient is doing better and no other concerns with N&V. 03-12-2021: Is compliant with medications. Provided patient with resources to help with understanding Lewy body dementia educational materials related to progression of lewy body dementia; Reviewed scheduled/upcoming provider appointments including saw neurologist recently and keeps appointments as scheduled, knows to call the pcp office for new needs or concerns. 01-15-2021: The patients daughter knows to call the office for changes in conditions, questions, or concerns. The patient sees the specialist on a regular basis.; Discussed plans with patient for ongoing care management follow up and provided patient with direct contact information for care management team; Advised patient to discuss progression and changes with provider; Screening for signs and symptoms of depression related to chronic disease state;  Assessed social determinant of health barriers;       GERD  (Status: Goal on Track (progressing): YES.) Long Term Goal  Evaluation of current treatment plan related to GERD,  self-management and  patient's adherence to plan as established by provider. Discussed plans with patient for ongoing care management follow up and provided patient with direct contact information for care management team Advised patient to sit upright when eating, do not lay down 30 minutes to an hour after eating, take medications as directed, avoid spicy foods to prevent exacerbation of GERD. The daughter states the patient has been complaining of his stomach hurting after eating; Provided education to patient re: GERD exacerbations and factors that cause exacerbation, changes to monitor for, precautions and safety; Reviewed medications with patient and discussed compliance. The patients daughter states the patient is compliant with medications.; Discussed plans with patient for ongoing care management follow up and provided patient with direct contact information for care management team; Advised patient to discuss changes in eating habits, changes in sx and sx associated with GERD, Questions and concerns with provider; Screening for signs and symptoms of depression related to chronic disease state;  Assessed social determinant of health barriers;       Growth on Left arm  (Status: New goal.) Short Term Goal  Evaluation of current treatment plan related to  Growth on left arm ,  self-management and patient's adherence to plan as established by provider. Discussed plans with patient for ongoing care management follow up and provided patient with direct contact information for care management team Advised  patient to call the office for changes in area, questions and concerns about the growth; Provided education to patient re: left arm growth, review of sx and sx presented, if pain is present, previous interventions from several years ago; Collaborated with pcp regarding recommendations for evaluation and treatment options to the left arm-growth. In basket message sent to the pcp asking for recommendations for left arm  growth. The patients daughter states the area is the size of an orange and getting bigger the patient says it hurts sometimes. The patient had surgical intervention before but it has been several years. The patients daughter is asking for pcp recommendations. Will collaborate with the pcp and get back with the patients daughter. Call made back to the daughter after collaboration with the pcp and gave options. [facility] NP Ralene Bathe will see the patient next week and can collaborate back with the pcp and get a referral. The daughter agrees with this plan and will follow up accordingly. Will send a message to NP Ralene Bathe with [facility] and let pcp know the desires of the patient and patients daughter. ; Discussed plans with patient for ongoing care management follow up and provided patient with direct contact information for care management team; Advised patient to discuss growth to the left arm with provider;    Patient Goals/Self-Care Activities: Patient will self administer medications as prescribed as evidenced by self report/primary caregiver report  Patient will attend all scheduled provider appointments as evidenced by clinician review of documented attendance to scheduled appointments and patient/caregiver report Patient will call pharmacy for medication refills as evidenced by patient report and review of pharmacy fill history as appropriate Patient will attend church or other social activities as evidenced by patient report Patient will continue to perform ADL's independently as evidenced by patient/caregiver report Patient will call provider office for new concerns or questions as evidenced by review of documented incoming telephone call notes and patient report Patient will work with BSW to address care coordination needs and will continue to work with the clinical team to address health care and disease management related needs as evidenced by documented adherence to scheduled care  management/care coordination appointments - check blood pressure 3 times per week - choose a place to take my blood pressure (home, clinic or office, retail store) - write blood pressure results in a log or diary - learn about high blood pressure - keep a blood pressure log - take blood pressure log to all doctor appointments - call doctor for signs and symptoms of high blood pressure - develop an action plan for high blood pressure - keep all doctor appointments - take medications for blood pressure exactly as prescribed - report new symptoms to your doctor - eat more whole grains, fruits and vegetables, lean meats and healthy fats - call for medicine refill 2 or 3 days before it runs out - take all medications exactly as prescribed - call doctor with any symptoms you believe are related to your medicine - call doctor when you experience any new symptoms - go to all doctor appointments as scheduled - adhere to prescribed diet: Heart Healthy Diet      Our next appointment is by telephone on 04-23-2021 at 0945 am  Please call the care guide team at [phone] if you need to cancel or reschedule your appointment.   If you are experiencing a Mental Health or [place] or need someone to talk to, please call the Suicide and Crisis Lifeline: 988 call  the [place] National Suicide Prevention Lifeline: 2104230674 or TTY: 978 687 4577 TTY 336-038-4569) to talk to a trained counselor call 1-800-273-TALK (toll free, 24 hour hotline)   Patient verbalizes understanding of instructions and care plan provided today and agrees to view in [place]. Active MyChart status confirmed with patient.    Noreene Larsson RN, MSN, [place] Mobile: [phone]

## 2021-03-20 ENCOUNTER — Other Ambulatory Visit: Payer: Self-pay

## 2021-03-20 ENCOUNTER — Other Ambulatory Visit: Payer: Medicare Other | Admitting: Primary Care

## 2021-03-20 VITALS — Ht 72.0 in | Wt 208.0 lb

## 2021-03-20 DIAGNOSIS — Z515 Encounter for palliative care: Secondary | ICD-10-CM

## 2021-03-20 DIAGNOSIS — Z9889 Other specified postprocedural states: Secondary | ICD-10-CM

## 2021-03-20 DIAGNOSIS — R600 Localized edema: Secondary | ICD-10-CM

## 2021-03-20 DIAGNOSIS — F0280 Dementia in other diseases classified elsewhere without behavioral disturbance: Secondary | ICD-10-CM

## 2021-03-20 NOTE — Progress Notes (Signed)
? ? ?Manufacturing engineer ?Community Palliative Care Consult Note ?Telephone: [phone]  ?Fax: [phone]  ? ? ?Date of encounter: 03/20/21 ?1:42 PM ?PATIENT NAME: Randy [name] ?[address] ?[place] [address]   ?[id number] (home)  ?DOB: October 12, 1934 ?MRN: [id number] ?PRIMARY CARE PROVIDER:    ?[name], DO,  ?[address] ?[place] [address] ?[phone] ? ?REFERRING PROVIDER:   ?[name], DO ?[address] ?[place],  [place] [address] ?[phone] ? ?RESPONSIBLE PARTY:    ?Contact Information   ? ? Name Relation Home Work Mobile  ? Hall,[name] Daughter [phone]  [phone]  ? Brenden, Rudman Spouse [phone]    ? ?  ? ? ?I met face to face with patient and family in  home. Palliative Care was asked to follow this patient by consultation request of  Randy [name] * to address advance care planning and complex medical decision making. This is a follow up visit. ? ?                                 ASSESSMENT AND PLAN / RECOMMENDATIONS:  ? ?Advance Care Planning/Goals of Care: Goals include to maximize quality of life and symptom management. Patient/health care surrogate gave his/her permission to discuss.Our advance care planning conversation included a discussion about:    ?The value and importance of advance care planning ?Exploration of personal, cultural or spiritual beliefs that might influence medical decisions  ?Exploration of goals of care in the event of a sudden injury or illness  ?Daughter and POA has refused to prepare a MOST saying they will make decisions at time of events. ?CODE STATUS: FULL  ? ?Symptom Management/Plan: ? ?HTN: Recent cardiology visit, still has 2 + LE edema, wt 208 lbs. On qod lasix which is keeping him level. He i ? ?Nutrition: Eating well, wt is declining, was 235 lbs.  Body mass index is 28.21 kg/m?. ?  His weight loss is 12% in 9 months, which is concerning as his function and mentation is constant. That said, he may need cueing  to have meals but daughter reports he eats all he's served. ? ?Mobility: denies falls, uses walker  Has theraband and list of exercises. Has had PT d/c x 3 weeks, from [facility]. ? ?Ganglion cyst:  Area on upper L arm, apparent suture scar. Area is 5 cm round approx, not hot to touch, not tender. Pt endorses having surgery on ganglion cyst in [place], > 5 years go. Discussed intervention as it's painful at hs, and does not hurt when not pressed. He would like it removed or drained. Advised draining would likely return and even the excision returned. However, it would be an office procedure most likely, and pt voices wanting it removed. Will report to PCP who can either lance or refer. Pt cannot remember name of MD, for obtaining records. ? ?Follow up Palliative Care Visit: Palliative care will continue to follow for complex medical decision making, advance care planning, and clarification of goals. Return 12 weeks or prn. ? ?I spent 60 minutes providing this consultation. More than 50% of the time in this consultation was spent in counseling and care coordination. ? ?PPS: 40% ? ?HOSPICE ELIGIBILITY/DIAGNOSIS: no ? ?Chief Complaint: edema, ganglion cyst L arm ? ?HISTORY OF PRESENT ILLNESS:  Randy [name] is a 87 y.o. year old male  with PD, lewy body dementia, PVD, edema, r/o ULE cyst . Patient seen  today to review palliative care needs to include medical decision making and advance care planning as appropriate.  ? ?History obtained from review of EMR, discussion with primary team, and interview with family, facility staff/caregiver and/or Randy [name].  ?I reviewed available labs, medications, imaging, studies and related documents from the EMR.  Records reviewed and summarized above.  ? ?ROS ? ?General: NAD ?EYES: denies vision changes, has glasses  ?ENMT: denies dysphagia ?Cardiovascular: denies chest pain, denies DOE ?Pulmonary: denies cough, denies increased SOB ?Abdomen: endorses good appetite, denies  constipation, endorses continence of bowel ?GU: denies dysuria, endorses continence of urine ?MSK:  denies  increased weakness,  no falls reported, endorses L arm swelling ?Skin: denies rashes or wounds ?Neurological: endorses arm pressure pain, endorses occ insomnia, no reports of visual hallucinations ?Psych: Endorses positive mood ? ?Physical Exam: ?Current and past weights: 208 lbs reported ?Constitutional: 130/90 HR 86 RR 18  ?General: frail appearing, WNWD ?EYES: anicteric sclera, lids intact, no discharge  ?ENMT: hard of  hearing, oral mucous membranes moist, dentition intact ?CV: S1S2, RRR, 1+ pitting bil LE edema ?Pulmonary: LCTA, no increased work of breathing, no cough, room air ?Abdomen: intake 50-100%, normo-active BS + 4 quadrants, soft and non tender, no ascites ?MSK: mild sarcopenia, moves all extremities, ambulatory with walker , + L UE swelling/defined area, likely cyst. 5 cm ?Skin: warm and dry, no rashes or wounds on visible skin ?Neuro:  + generalized weakness,  moderate cognitive impairment, non-anxious affect, reports shuffle gait, on sinemet x 5 years.  ? ?Thank you for the opportunity to participate in the care of Randy [name].  The palliative care team will continue to follow. Please call our office at [phone] if we can be of additional assistance.  ? ?Jason Coop, NP DNP, AGPCNP-BC ? ?COVID-19 PATIENT SCREENING TOOL ?Asked and negative response unless otherwise noted:  ? ?Have you had symptoms of covid, tested positive or been in contact with someone with symptoms/positive test in the past 5-10 days?  ? ?

## 2021-03-26 ENCOUNTER — Other Ambulatory Visit: Payer: Self-pay | Admitting: Family Medicine

## 2021-03-26 ENCOUNTER — Ambulatory Visit: Payer: Self-pay

## 2021-03-26 DIAGNOSIS — I10 Essential (primary) hypertension: Secondary | ICD-10-CM

## 2021-03-26 DIAGNOSIS — M6740 Ganglion, unspecified site: Secondary | ICD-10-CM

## 2021-03-26 DIAGNOSIS — F0280 Dementia in other diseases classified elsewhere without behavioral disturbance: Secondary | ICD-10-CM

## 2021-03-26 DIAGNOSIS — R413 Other amnesia: Secondary | ICD-10-CM

## 2021-03-26 DIAGNOSIS — D492 Neoplasm of unspecified behavior of bone, soft tissue, and skin: Secondary | ICD-10-CM

## 2021-03-26 NOTE — Chronic Care Management (AMB) (Signed)
?Chronic Care Management  ? ?CCM RN Visit Note ? ?03/26/2021 ?Name: Randy [name] MRN: [id number] DOB: 06/02/1934 ? ?Subjective: ?Randy [name] is a 87 y.o. year old male who is a primary care patient of [name], DO. The care management team was consulted for assistance with disease management and care coordination needs.   ? ?Engaged with patient by telephone for follow up visit in response to provider referral for case management and/or care coordination services.  ? ?Consent to Services:  ?The patient was given information about Chronic Care Management services, agreed to services, and gave verbal consent prior to initiation of services.  Please see initial visit note for detailed documentation.  ? ?Patient agreed to services and verbal consent obtained.  ? ?Assessment: Review of patient past medical history, allergies, medications, health status, including review of consultants reports, laboratory and other test data, was performed as part of comprehensive evaluation and provision of chronic care management services.  ? ?SDOH (Social Determinants of Health) assessments and interventions performed:   ? ?CCM Care Plan ? ?Allergies  ?Allergen Reactions  ? Antihistamines, Chlorpheniramine-Type Other (See Comments)  ?  Can't urinate  ? ? ?Outpatient Encounter Medications as of 03/26/2021  ?Medication Sig  ? acetaminophen (TYLENOL) 500 MG tablet Take 1,000 mg by mouth every 6 (six) hours as needed for moderate pain.  ? carbidopa-levodopa (SINEMET CR) 50-200 MG tablet Take 1 tablet by mouth 4 (four) times daily.  ? cholecalciferol (VITAMIN D) 1000 units tablet Take 1,000 Units by mouth daily.  ? Coenzyme Q10 (CO Q 10) 100 MG CAPS Take 100 mg by mouth daily.   ? diclofenac Sodium (VOLTAREN) 1 % GEL APPLY 2 G TOPICALLY 4 (FOUR) TIMES DAILY AS NEEDED. TO KNEE ARTHRITIS PAIN  ? enalapril (VASOTEC) 5 MG tablet Take 1 tablet (5 mg total) by mouth daily.  ? fluticasone (FLONASE) 50 MCG/ACT nasal spray Place 2  sprays into both nostrils as needed for allergies.  ? furosemide (LASIX) 20 MG tablet Take 1 tablet (20 mg total) by mouth every other day.  ? Multiple Vitamin (MULTIVITAMIN WITH MINERALS) TABS tablet Take 1 tablet by mouth daily.  ? Omega-3 Fatty Acids (FISH OIL) 1200 MG CAPS Take 1,200 mg by mouth in the morning, at noon, in the evening, and at bedtime.  ? omeprazole (PRILOSEC) 20 MG capsule Take 40 mg by mouth daily.  ? rivastigmine (EXELON) 3 MG capsule Take 3 mg by mouth 2 (two) times daily.  ? sertraline (ZOLOFT) 50 MG tablet Take 50 mg by mouth at bedtime.  ? traMADol (ULTRAM) 50 MG tablet Take 1 tablet (50 mg total) by mouth every 8 (eight) hours as needed.  ? vitamin C (ASCORBIC ACID) 500 MG tablet Take 500 mg by mouth daily.  ? zinc sulfate 220 (50 Zn) MG capsule Take 1 capsule (220 mg total) by mouth daily.  ? ?No facility-administered encounter medications on file as of 03/26/2021.  ? ? ?Patient Active Problem List  ? Diagnosis Date Noted  ? Swelling of limb 12/26/2020  ? Bilateral primary osteoarthritis of knee 02/15/2020  ? Pressure injury of buttock, stage 1   ? Delirium   ? COVID-19 virus infection 10/08/2019  ? Unable to care for self 10/08/2019  ? Caregiver unable to cope 10/08/2019  ? Recurrent falls 10/08/2019  ? Lewy body dementia without behavioral disturbance ([place])   ? Prostate cancer ([place]) 06/30/2019  ? BPH with urinary obstruction 11/06/2018  ? Acute cholecystitis 09/08/2018  ? Myalgia  due to statin 02/24/2018  ? Tricompartment osteoarthritis of left knee 11/14/2017  ? Generalized weakness 04/28/2017  ? Chronic low back pain 03/12/2017  ? Knee pain, chronic 10/01/2016  ? Chronic venous insufficiency 08/30/2016  ? BPH without obstruction/lower urinary tract symptoms 03/06/2016  ? Osteoarthritis of multiple joints 03/06/2016  ? Degenerative joint disease (DJD) of lumbar spine 03/06/2016  ? Essential hypertension 03/05/2016  ? Hyperlipidemia 03/05/2016  ? Osteoporosis 03/05/2016  ? GERD  (gastroesophageal reflux disease) 03/05/2016  ? Bilateral lower extremity edema 03/05/2016  ? Dupuytren's disease of palm 05/28/2015  ? ? ?Conditions to be addressed/monitored:HTN, Dementia, and Left arm ganglion cyst ? ?Care Plan : RNCM: General Plan of Care (Adult) For Chronic Disease Management and Care Coordination Needs  ?Updates made by Vanita Ingles, RN since 03/26/2021 12:00 AM  ?  ? ?Problem: RNCM; Development of Plan of Care for Chronic Disease Management ( lewy body dementia, HTN, HLD, Urinary Incontinence)   ?Priority: High  ?  ? ?Long-Range Goal: RNCM; Effective Management  of Plan of Care for Chronic Disease Management ( lewy body dementia, HTN, HLD, Urinary Incontinence)   ?Start Date: 11/13/2020  ?Expected End Date: 11/13/2021  ?Priority: High  ?Note:   ?Current Barriers:  ?Knowledge Deficits related to plan of care for management of HTN, HLD, and Demential and Urinary Incontinence with enlarged prostate  ?Care Coordination needs related to Memory Deficits and family support and education due to Lewy Body Dementia  ?Chronic Disease Management support and education needs related to HTN, HLD, and Dementia and urinary Incontinence  ?Cognitive Deficits ?Ganglion cyst evaluated by Lonia Chimera NP, Ralene Bathe on 03-20-2021 ? ?RNCM Clinical Goal(s):  ?Patient will verbalize understanding of plan for management of HTN, HLD, Dementia, and Urinary Incontinence with recent bladder stone as evidenced by effective management of chronic condtions without any exacerbations and maintaining health and well being ?take all medications exactly as prescribed and will call provider for medication related questions as evidenced by compliance with medications and working with the pharm D for questions or concerns related to medications    ?attend all scheduled medical appointments: no upcoming with pcp but has with specialist  as evidenced by keeping appointments and calling the office for changes in condition or new concerns  that need to be addressed         ?continue to work with Consulting civil engineer and/or Social Worker to address care management and care coordination needs related to HTN, HLD, Dementia, and Urinary incontinence  as evidenced by adherence to CM Team Scheduled appointments     ?demonstrate a decrease in Urinary incontinence with recent bladder stone, and changes in memory due to lewy body demential exacerbations  as evidenced by stable conditions, no acute exacerbations and working with the CCM team to manage health and well being ?demonstrate ongoing self health care management ability of effective management of chronic conditions as evidenced by  working with the CCM team  through collaboration with Consulting civil engineer, provider, and care team.  ? ?Interventions: ?1:1 collaboration with primary care provider regarding development and update of comprehensive plan of care as evidenced by provider attestation and co-signature ?Inter-disciplinary care team collaboration (see longitudinal plan of care) ?Evaluation of current treatment plan related to  self management and patient's adherence to plan as established by provider ? ? ?SDOH Barriers (Status: Goal on Track (progressing): YES.) Long Term Goal  ?Patient interviewed and SDOH assessment performed ?       ?Patient interviewed and appropriate  assessments performed ?Provided patient with information about resources in the county and care guides avaialble to assist with changes in SDOH or new needs as they arise ?Discussed plans with patient for ongoing care management follow up and provided patient with direct contact information for care management team ?Advised patient to call the office for changes in SDOH, Question, or Concerns ?Provided education to patient/caregiver regarding level of care options. ? ? ? ?Urinary Incontinence with enlarged prostate and recent bladder stone  (Status: Goal Met. No Needs Identified this visit) Long Term Goal Closing this goal and will  re-visit if new concerns 03-12-2021 ?Evaluation of current treatment plan related to  Urinary incontinence with enlarged prostate and recent bladder stone  , Level of care concerns and Memory Deficits self-management an

## 2021-03-26 NOTE — Patient Instructions (Signed)
Visit Information ? ?Thank you for taking time to visit with me today. Please don't hesitate to contact me if I can be of assistance to you before our next scheduled telephone appointment. ? ?Following are the goals we discussed today:  ?RNCM Clinical Goal(s):  ?Patient will verbalize understanding of plan for management of HTN, HLD, Dementia, and Urinary Incontinence with recent bladder stone as evidenced by effective management of chronic condtions without any exacerbations and maintaining health and well being ?take all medications exactly as prescribed and will call provider for medication related questions as evidenced by compliance with medications and working with the pharm D for questions or concerns related to medications    ?attend all scheduled medical appointments: no upcoming with pcp but has with specialist  as evidenced by keeping appointments and calling the office for changes in condition or new concerns that need to be addressed         ?continue to work with Consulting civil engineer and/or Social Worker to address care management and care coordination needs related to HTN, HLD, Dementia, and Urinary incontinence  as evidenced by adherence to CM Team Scheduled appointments     ?demonstrate a decrease in Urinary incontinence with recent bladder stone, and changes in memory due to lewy body demential exacerbations  as evidenced by stable conditions, no acute exacerbations and working with the CCM team to manage health and well being ?demonstrate ongoing self health care management ability of effective management of chronic conditions as evidenced by  working with the CCM team  through collaboration with Consulting civil engineer, provider, and care team.  ?  ?Interventions: ?1:1 collaboration with primary care provider regarding development and update of comprehensive plan of care as evidenced by provider attestation and co-signature ?Inter-disciplinary care team collaboration (see longitudinal plan of  care) ?Evaluation of current treatment plan related to  self management and patient's adherence to plan as established by provider ?  ?  ?SDOH Barriers (Status: Goal on Track (progressing): YES.) Long Term Goal  ?Patient interviewed and SDOH assessment performed ?       ?Patient interviewed and appropriate assessments performed ?Provided patient with information about resources in the county and care guides avaialble to assist with changes in SDOH or new needs as they arise ?Discussed plans with patient for ongoing care management follow up and provided patient with direct contact information for care management team ?Advised patient to call the office for changes in SDOH, Question, or Concerns ?Provided education to patient/caregiver regarding level of care options. ?  ?  ?  ?Urinary Incontinence with enlarged prostate and recent bladder stone  (Status: Goal Met. No Needs Identified this visit) Long Term Goal Closing this goal and will re-visit if new concerns 03-12-2021 ?Evaluation of current treatment plan related to  Urinary incontinence with enlarged prostate and recent bladder stone  , Level of care concerns and Memory Deficits self-management and patient's adherence to plan as established by provider. 11-13-2020: The patient recently had bladder stone removed on 10-20-2020. The patients daughter states that since removal of the bladder stone the patient denies any pain on urination and states he feels better. The daughter has taken the patient to follow ups with urologist. Review of sx and sx to monitor for changes in urinary health and well being. 01-15-2021: The patients daughter states that he is doing well since the procedure last year. He has not had anymore complaints or issues related to urinary retention. Will continue to monitor for changes.  ?Discussed  plans with patient for ongoing care management follow up and provided patient with direct contact information for care management team ?Advised patient to  call the office for changes in urinary health, questions or concerns; ?Provided education to patient re: monitoring for sx and sx of UTI, increased pain on urinarion, new concerns related to urinary incontinence. 01-15-2021: Review and the patient or daughter denies any issues related to urinary retention or concerns ; ?Reviewed medications with patient and discussed compliance, the wife and daughter assist with medications management. 01-15-2021: The daughter states compliance with medications  ; ?Reviewed scheduled/upcoming provider appointments including Decemember 2022, with Urology specialist  The patien thas no appointment with the pcp coming up but education provided to the daughter to call the office for changes. 01-15-2021: Follows up with the provider as needed. Denies any new concerns with urinary retention; ?Discussed plans with patient for ongoing care management follow up and provided patient with direct contact information for care management team; ?Advised patient to discuss new development of urinary sx and sx with provider; ?Screening for signs and symptoms of depression related to chronic disease state;  ?Assessed social determinant of health barriers;  ?  ?Hyperlipidemia:  (Status: Goal on Track (progressing): YES.) ?     ?Lab Results  ?Component Value Date  ?  CHOL 156 07/17/2020  ?  HDL 37 (L) 07/17/2020  ?  LDLCALC 105 (H) 07/17/2020  ?  TRIG 71 07/17/2020  ?  CHOLHDL 4.2 07/17/2020  ?  ?  ?Medication review performed; medication list updated in electronic medical record.  ?Provider established cholesterol goals reviewed; ?Counseled on importance of regular laboratory monitoring as prescribed. 03-12-2021: Review of the need to have regular lab work; ?Provided HLD Scientist, clinical (histocompatibility and immunogenetics); ?Reviewed role and benefits of statin for ASCVD risk reduction; ?Discussed strategies to manage statin-induced myalgias; ?Reviewed importance of limiting foods high in cholesterol. 01-15-2021: The patients daughter states  that the patient is not eating as well as he once was. She states partly because she things he does not want to prepare it or he forgets. His wife makes sure he eats when she is up but her daughter is working a lot when he is up and doing daily activities. Education and support given. 03-12-2021: The patient is eating well now. Does complain some of pain after eating. Discussed eating slow, sitting upright when eating and not laying down for 30 to one hour after eating to prevent issues with GERD. Education and support given.  ?  ?Hypertension: (Status: Goal on Track (progressing): YES.) ?Last practice recorded BP readings:  ?   ?BP Readings from Last 3 Encounters:  ?02/26/21 120/78  ?01/19/21 (!) 166/84  ?12/26/20 (!) 152/82  ?Home visit on 03-20-2021 by [facility]: 130/90 with pulse of 86 ?Most recent eGFR/CrCl: No results found for: EGFR  No components found for: CRCL ?  ?Evaluation of current treatment plan related to hypertension self management and patient's adherence to plan as established by provider. 11-13-2020: The patient has long standing issues with his blood pressures fluctuating. The patient does take his blood pressures at home. The patients daughter and wife assist with his care. States compliance with medications. Will continue to monitor. 01-15-2021: The patient is still having fluctuations in his blood pressures. The patient saw Dr. Lucky Cowboy recently for venous insufficiency and will have further testing on 01-19-2021: The patients daughter states his right leg is more swollen that the left. See new disease process under venous insufficiency.   03-12-2021: The patient saw the  cardiologist recently and got a good check up. Does not have to see cardiology for one year. The patient did see vascular specialist a few months ago and it was recommended the patient get a lymphedema machine. The daughter is going to follow up on this as she has not heard back from the provider on this. Education and support given.  03-26-2021: The patient was evaluated by NP, Ralene Bathe in home on 03-20-2021. Blood pressure was 130/90 and pulse was 86. The patients weight was 208. No new findings noted. Will continue to monitor. ?Reviewed prescri

## 2021-03-26 NOTE — Progress Notes (Signed)
This encounter was created in error - please disregard.

## 2021-03-27 ENCOUNTER — Ambulatory Visit: Payer: Self-pay

## 2021-03-27 DIAGNOSIS — F0280 Dementia in other diseases classified elsewhere without behavioral disturbance: Secondary | ICD-10-CM

## 2021-03-27 DIAGNOSIS — M6740 Ganglion, unspecified site: Secondary | ICD-10-CM

## 2021-03-27 DIAGNOSIS — I10 Essential (primary) hypertension: Secondary | ICD-10-CM

## 2021-03-27 NOTE — Chronic Care Management (AMB) (Signed)
?Chronic Care Management  ? ?CCM RN Visit Note ? ?03/27/2021 ?Name: Randy [name] MRN: [id number] DOB: Mar 20, 1934 ? ?Subjective: ?Randy [name] is a 87 y.o. year old male who is a primary care patient of [name], DO. The care management team was consulted for assistance with disease management and care coordination needs.   ? ?Engaged with patient by telephone for follow up visit in response to provider referral for case management and/or care coordination services. Spoke to the patients daughter, Randy [name] ? ?Consent to Services:  ?The patient was given information about Chronic Care Management services, agreed to services, and gave verbal consent prior to initiation of services.  Please see initial visit note for detailed documentation.  ? ?Patient agreed to services and verbal consent obtained.  ? ?Assessment: Review of patient past medical history, allergies, medications, health status, including review of consultants reports, laboratory and other test data, was performed as part of comprehensive evaluation and provision of chronic care management services.  ? ?SDOH (Social Determinants of Health) assessments and interventions performed:   ? ?CCM Care Plan ? ?Allergies  ?Allergen Reactions  ? Antihistamines, Chlorpheniramine-Type Other (See Comments)  ?  Can't urinate  ? ? ?Outpatient Encounter Medications as of 03/27/2021  ?Medication Sig  ? acetaminophen (TYLENOL) 500 MG tablet Take 1,000 mg by mouth every 6 (six) hours as needed for moderate pain.  ? carbidopa-levodopa (SINEMET CR) 50-200 MG tablet Take 1 tablet by mouth 4 (four) times daily.  ? cholecalciferol (VITAMIN D) 1000 units tablet Take 1,000 Units by mouth daily.  ? Coenzyme Q10 (CO Q 10) 100 MG CAPS Take 100 mg by mouth daily.   ? diclofenac Sodium (VOLTAREN) 1 % GEL APPLY 2 G TOPICALLY 4 (FOUR) TIMES DAILY AS NEEDED. TO KNEE ARTHRITIS PAIN  ? enalapril (VASOTEC) 5 MG tablet Take 1 tablet (5 mg total) by mouth daily.  ? fluticasone  (FLONASE) 50 MCG/ACT nasal spray Place 2 sprays into both nostrils as needed for allergies.  ? furosemide (LASIX) 20 MG tablet Take 1 tablet (20 mg total) by mouth every other day.  ? Multiple Vitamin (MULTIVITAMIN WITH MINERALS) TABS tablet Take 1 tablet by mouth daily.  ? Omega-3 Fatty Acids (FISH OIL) 1200 MG CAPS Take 1,200 mg by mouth in the morning, at noon, in the evening, and at bedtime.  ? omeprazole (PRILOSEC) 20 MG capsule Take 40 mg by mouth daily.  ? rivastigmine (EXELON) 3 MG capsule Take 3 mg by mouth 2 (two) times daily.  ? sertraline (ZOLOFT) 50 MG tablet Take 50 mg by mouth at bedtime.  ? traMADol (ULTRAM) 50 MG tablet Take 1 tablet (50 mg total) by mouth every 8 (eight) hours as needed.  ? vitamin C (ASCORBIC ACID) 500 MG tablet Take 500 mg by mouth daily.  ? zinc sulfate 220 (50 Zn) MG capsule Take 1 capsule (220 mg total) by mouth daily.  ? ?No facility-administered encounter medications on file as of 03/27/2021.  ? ? ?Patient Active Problem List  ? Diagnosis Date Noted  ? Swelling of limb 12/26/2020  ? Bilateral primary osteoarthritis of knee 02/15/2020  ? Pressure injury of buttock, stage 1   ? Delirium   ? COVID-19 virus infection 10/08/2019  ? Unable to care for self 10/08/2019  ? Caregiver unable to cope 10/08/2019  ? Recurrent falls 10/08/2019  ? Lewy body dementia without behavioral disturbance ([place])   ? Prostate cancer ([place]) 06/30/2019  ? BPH with urinary obstruction 11/06/2018  ?  Acute cholecystitis 09/08/2018  ? Myalgia due to statin 02/24/2018  ? Tricompartment osteoarthritis of left knee 11/14/2017  ? Generalized weakness 04/28/2017  ? Chronic low back pain 03/12/2017  ? Knee pain, chronic 10/01/2016  ? Chronic venous insufficiency 08/30/2016  ? BPH without obstruction/lower urinary tract symptoms 03/06/2016  ? Osteoarthritis of multiple joints 03/06/2016  ? Degenerative joint disease (DJD) of lumbar spine 03/06/2016  ? Essential hypertension 03/05/2016  ? Hyperlipidemia 03/05/2016  ?  Osteoporosis 03/05/2016  ? GERD (gastroesophageal reflux disease) 03/05/2016  ? Bilateral lower extremity edema 03/05/2016  ? Dupuytren's disease of palm 05/28/2015  ? ? ?Conditions to be addressed/monitored:HTN, Dementia, and ganglion cyst and referral to [facility] ? ?Care Plan : RNCM: General Plan of Care (Adult) For Chronic Disease Management and Care Coordination Needs  ?Updates made by Vanita Ingles, RN since 03/27/2021 12:00 AM  ?  ? ?Problem: RNCM; Development of Plan of Care for Chronic Disease Management ( lewy body dementia, HTN, HLD, Urinary Incontinence)   ?Priority: High  ?  ? ?Long-Range Goal: RNCM; Effective Management  of Plan of Care for Chronic Disease Management ( lewy body dementia, HTN, HLD, Urinary Incontinence)   ?Start Date: 11/13/2020  ?Expected End Date: 11/13/2021  ?Priority: High  ?Note:   ?Current Barriers:  ?Knowledge Deficits related to plan of care for management of HTN, HLD, and Demential and Urinary Incontinence with enlarged prostate  ?Care Coordination needs related to Memory Deficits and family support and education due to Lewy Body Dementia  ?Chronic Disease Management support and education needs related to HTN, HLD, and Dementia and urinary Incontinence  ?Cognitive Deficits ?Ganglion cyst evaluated by Lonia Chimera NP, Ralene Bathe on 03-20-2021 ? ?RNCM Clinical Goal(s):  ?Patient will verbalize understanding of plan for management of HTN, HLD, Dementia, and Urinary Incontinence with recent bladder stone as evidenced by effective management of chronic condtions without any exacerbations and maintaining health and well being ?take all medications exactly as prescribed and will call provider for medication related questions as evidenced by compliance with medications and working with the pharm D for questions or concerns related to medications    ?attend all scheduled medical appointments: no upcoming with pcp but has with specialist  as evidenced by keeping appointments and calling  the office for changes in condition or new concerns that need to be addressed         ?continue to work with Consulting civil engineer and/or Social Worker to address care management and care coordination needs related to HTN, HLD, Dementia, and Urinary incontinence  as evidenced by adherence to CM Team Scheduled appointments     ?demonstrate a decrease in Urinary incontinence with recent bladder stone, and changes in memory due to lewy body demential exacerbations  as evidenced by stable conditions, no acute exacerbations and working with the CCM team to manage health and well being ?demonstrate ongoing self health care management ability of effective management of chronic conditions as evidenced by  working with the CCM team  through collaboration with Consulting civil engineer, provider, and care team.  ? ?Interventions: ?1:1 collaboration with primary care provider regarding development and update of comprehensive plan of care as evidenced by provider attestation and co-signature ?Inter-disciplinary care team collaboration (see longitudinal plan of care) ?Evaluation of current treatment plan related to  self management and patient's adherence to plan as established by provider ? ? ?SDOH Barriers (Status: Goal on Track (progressing): YES.) Long Term Goal  ?Patient interviewed and SDOH assessment performed ?       ?  Patient interviewed and appropriate assessments performed ?Provided patient with information about resources in the county and care guides avaialble to assist with changes in SDOH or new needs as they arise ?Discussed plans with patient for ongoing care management follow up and provided patient with direct contact information for care management team ?Advised patient to call the office for changes in SDOH, Question, or Concerns ?Provided education to patient/caregiver regarding level of care options. ? ? ? ?Urinary Incontinence with enlarged prostate and recent bladder stone  (Status: Goal Met. No Needs Identified this  visit) Long Term Goal Closing this goal and will re-visit if new concerns 03-12-2021 ?Evaluation of current treatment plan related to  Urinary incontinence with enlarged prostate and recent bladder stone  ,

## 2021-03-27 NOTE — Patient Instructions (Signed)
Visit Information ? ?Thank you for taking time to visit with me today. Please don't hesitate to contact me if I can be of assistance to you before our next scheduled telephone appointment. ? ?Following are the goals we discussed today:  ? ?RNCM Clinical Goal(s):  ?Patient will verbalize understanding of plan for management of HTN, HLD, Dementia, and Urinary Incontinence with recent bladder stone as evidenced by effective management of chronic condtions without any exacerbations and maintaining health and well being ?take all medications exactly as prescribed and will call provider for medication related questions as evidenced by compliance with medications and working with the pharm D for questions or concerns related to medications    ?attend all scheduled medical appointments: no upcoming with pcp but has with specialist  as evidenced by keeping appointments and calling the office for changes in condition or new concerns that need to be addressed         ?continue to work with Consulting civil engineer and/or Social Worker to address care management and care coordination needs related to HTN, HLD, Dementia, and Urinary incontinence  as evidenced by adherence to CM Team Scheduled appointments     ?demonstrate a decrease in Urinary incontinence with recent bladder stone, and changes in memory due to lewy body demential exacerbations  as evidenced by stable conditions, no acute exacerbations and working with the CCM team to manage health and well being ?demonstrate ongoing self health care management ability of effective management of chronic conditions as evidenced by  working with the CCM team  through collaboration with Consulting civil engineer, provider, and care team.  ?  ?Interventions: ?1:1 collaboration with primary care provider regarding development and update of comprehensive plan of care as evidenced by provider attestation and co-signature ?Inter-disciplinary care team collaboration (see longitudinal plan of  care) ?Evaluation of current treatment plan related to  self management and patient's adherence to plan as established by provider ?  ?  ?SDOH Barriers (Status: Goal on Track (progressing): YES.) Long Term Goal  ?Patient interviewed and SDOH assessment performed ?       ?Patient interviewed and appropriate assessments performed ?Provided patient with information about resources in the county and care guides avaialble to assist with changes in SDOH or new needs as they arise ?Discussed plans with patient for ongoing care management follow up and provided patient with direct contact information for care management team ?Advised patient to call the office for changes in SDOH, Question, or Concerns ?Provided education to patient/caregiver regarding level of care options. ?  ?  ?  ?Urinary Incontinence with enlarged prostate and recent bladder stone  (Status: Goal Met. No Needs Identified this visit) Long Term Goal Closing this goal and will re-visit if new concerns 03-12-2021 ?Evaluation of current treatment plan related to  Urinary incontinence with enlarged prostate and recent bladder stone  , Level of care concerns and Memory Deficits self-management and patient's adherence to plan as established by provider. 11-13-2020: The patient recently had bladder stone removed on 10-20-2020. The patients daughter states that since removal of the bladder stone the patient denies any pain on urination and states he feels better. The daughter has taken the patient to follow ups with urologist. Review of sx and sx to monitor for changes in urinary health and well being. 01-15-2021: The patients daughter states that he is doing well since the procedure last year. He has not had anymore complaints or issues related to urinary retention. Will continue to monitor for changes.  ?  Discussed plans with patient for ongoing care management follow up and provided patient with direct contact information for care management team ?Advised patient to  call the office for changes in urinary health, questions or concerns; ?Provided education to patient re: monitoring for sx and sx of UTI, increased pain on urinarion, new concerns related to urinary incontinence. 01-15-2021: Review and the patient or daughter denies any issues related to urinary retention or concerns ; ?Reviewed medications with patient and discussed compliance, the wife and daughter assist with medications management. 01-15-2021: The daughter states compliance with medications  ; ?Reviewed scheduled/upcoming provider appointments including Decemember 2022, with Urology specialist  The patien thas no appointment with the pcp coming up but education provided to the daughter to call the office for changes. 01-15-2021: Follows up with the provider as needed. Denies any new concerns with urinary retention; ?Discussed plans with patient for ongoing care management follow up and provided patient with direct contact information for care management team; ?Advised patient to discuss new development of urinary sx and sx with provider; ?Screening for signs and symptoms of depression related to chronic disease state;  ?Assessed social determinant of health barriers;  ?  ?Hyperlipidemia:  (Status: Goal on Track (progressing): YES.) ?     ?Lab Results  ?Component Value Date  ?  CHOL 156 07/17/2020  ?  HDL 37 (L) 07/17/2020  ?  LDLCALC 105 (H) 07/17/2020  ?  TRIG 71 07/17/2020  ?  CHOLHDL 4.2 07/17/2020  ?  ?  ?Medication review performed; medication list updated in electronic medical record.  ?Provider established cholesterol goals reviewed; ?Counseled on importance of regular laboratory monitoring as prescribed. 03-12-2021: Review of the need to have regular lab work; ?Provided HLD Scientist, clinical (histocompatibility and immunogenetics); ?Reviewed role and benefits of statin for ASCVD risk reduction; ?Discussed strategies to manage statin-induced myalgias; ?Reviewed importance of limiting foods high in cholesterol. 01-15-2021: The patients daughter states  that the patient is not eating as well as he once was. She states partly because she things he does not want to prepare it or he forgets. His wife makes sure he eats when she is up but her daughter is working a lot when he is up and doing daily activities. Education and support given. 03-12-2021: The patient is eating well now. Does complain some of pain after eating. Discussed eating slow, sitting upright when eating and not laying down for 30 to one hour after eating to prevent issues with GERD. Education and support given.  ?  ?Hypertension: (Status: Goal on Track (progressing): YES.) ?Last practice recorded BP readings:  ?   ?BP Readings from Last 3 Encounters:  ?02/26/21 120/78  ?01/19/21 (!) 166/84  ?12/26/20 (!) 152/82  ?Home visit on 03-20-2021 by [facility]: 130/90 with pulse of 86 ?Most recent eGFR/CrCl: No results found for: EGFR  No components found for: CRCL ?  ?Evaluation of current treatment plan related to hypertension self management and patient's adherence to plan as established by provider. 11-13-2020: The patient has long standing issues with his blood pressures fluctuating. The patient does take his blood pressures at home. The patients daughter and wife assist with his care. States compliance with medications. Will continue to monitor. 01-15-2021: The patient is still having fluctuations in his blood pressures. The patient saw Dr. Lucky Cowboy recently for venous insufficiency and will have further testing on 01-19-2021: The patients daughter states his right leg is more swollen that the left. See new disease process under venous insufficiency.   03-12-2021: The patient saw  the cardiologist recently and got a good check up. Does not have to see cardiology for one year. The patient did see vascular specialist a few months ago and it was recommended the patient get a lymphedema machine. The daughter is going to follow up on this as she has not heard back from the provider on this. Education and support given.  03-26-2021: The patient was evaluated by NP, Ralene Bathe in home on 03-20-2021. Blood pressure was 130/90 and pulse was 86. The patients weight was 208. No new findings noted. Will continue to monitor. ?Reviewed presc

## 2021-03-27 NOTE — Addendum Note (Signed)
Addended by: [name] on: 03/27/2021 11:58 AM ? ? Modules accepted: Orders ? ?

## 2021-04-06 DIAGNOSIS — F0280 Dementia in other diseases classified elsewhere without behavioral disturbance: Secondary | ICD-10-CM

## 2021-04-06 DIAGNOSIS — I10 Essential (primary) hypertension: Secondary | ICD-10-CM

## 2021-04-06 DIAGNOSIS — G3183 Dementia with Lewy bodies: Secondary | ICD-10-CM

## 2021-04-06 DIAGNOSIS — E785 Hyperlipidemia, unspecified: Secondary | ICD-10-CM

## 2021-04-11 ENCOUNTER — Telehealth: Payer: Medicare Other

## 2021-04-20 ENCOUNTER — Ambulatory Visit (INDEPENDENT_AMBULATORY_CARE_PROVIDER_SITE_OTHER): Payer: Medicare Other | Admitting: Pharmacist

## 2021-04-20 DIAGNOSIS — R413 Other amnesia: Secondary | ICD-10-CM

## 2021-04-20 DIAGNOSIS — I10 Essential (primary) hypertension: Secondary | ICD-10-CM

## 2021-04-20 DIAGNOSIS — F0280 Dementia in other diseases classified elsewhere without behavioral disturbance: Secondary | ICD-10-CM

## 2021-04-20 NOTE — Chronic Care Management (AMB) (Signed)
? ?Chronic Care Management ?CCM Pharmacy Note ? ?04/20/2021 ?Name:  ALYAS CREARY MRN:  [id number] DOB:  09-21-1934 ? ? ?Subjective: ?Randy [name] is an 87 y.o. year old male who is a primary patient of [name], DO.  The CCM team was consulted for assistance with disease management and care coordination needs.   ? ?Engaged with patient's daughter/caregiver by telephone for follow up visit for pharmacy case management and/or care coordination services.  ? ?Objective: ? ?Medications Reviewed Today   ? ? Reviewed by [name], RPH-CPP (Pharmacist) on 04/20/21 at [place] List Status: <None>  ? ?Medication Order Taking? Sig Documenting Provider Last Dose Status Informant  ?acetaminophen (TYLENOL) 500 MG tablet [id number] Yes Take 1,000 mg by mouth every 6 (six) hours as needed for moderate pain. [provider] Taking Active Family Member  ?carbidopa-levodopa (SINEMET CR) 50-200 MG tablet [id number] Yes Take 1 tablet by mouth 4 (four) times daily. [provider] Taking Active Family Member  ?cholecalciferol (VITAMIN D) 1000 units tablet [id number]  Take 1,000 Units by mouth daily. [provider]  Active Family Member  ?Coenzyme Q10 (CO Q 10) 100 MG CAPS [id number]  Take 100 mg by mouth daily.  [provider]  Active Family Member  ?diclofenac Sodium (VOLTAREN) 1 % GEL [id number]  APPLY 2 G TOPICALLY 4 (FOUR) TIMES DAILY AS NEEDED. TO KNEE ARTHRITIS PAIN [name], DO  Active Family Member  ?enalapril (VASOTEC) 5 MG tablet [id number] Yes Take 1 tablet (5 mg total) by mouth daily. [name], DO Taking Active   ?fluticasone (FLONASE) 50 MCG/ACT nasal spray [id number]  Place 2 sprays into both nostrils as needed for allergies. [provider]  Active Family Member  ?         ?Med Note [name], [name]   Mon Oct 26, 2018  1:07 PM)    ?furosemide (LASIX) 20 MG tablet [id number] Yes Take 1 tablet (20 mg total) by mouth every other  day. Kate Sable, MD Taking Active   ?Multiple Vitamin (MULTIVITAMIN WITH MINERALS) TABS tablet [id number]  Take 1 tablet by mouth daily. [name], MD  Active Family Member  ?Omega-3 Fatty Acids (FISH OIL) 1200 MG CAPS [id number]  Take 1,200 mg by mouth in the morning, at noon, in the evening, and at bedtime. [provider]  Active Family Member  ?omeprazole (PRILOSEC) 20 MG capsule [id number]  Take 40 mg by mouth daily. [provider]  Active Family Member  ?rivastigmine (EXELON) 3 MG capsule [id number]  Take 3 mg by mouth 2 (two) times daily. [provider]  Active   ?sertraline (ZOLOFT) 50 MG tablet [id number]  Take 50 mg by mouth at bedtime. [provider]  Active Family Member  ?traMADol (ULTRAM) 50 MG tablet [id number]  Take 1 tablet (50 mg total) by mouth every 8 (eight) hours as needed. [name], DO  Active Family Member  ?vitamin C (ASCORBIC ACID) 500 MG tablet [id number]  Take 500 mg by mouth daily. [provider]  Active Family Member  ?zinc sulfate 220 (50 Zn) MG capsule [id number]  Take 1 capsule (220 mg total) by mouth daily. [name], MD  Active Family Member  ? ?  ?  ? ?  ? ? ?Pertinent Labs:  ? ?Lab Results  ?Component Value Date  ? CREATININE 0.90 10/17/2020  ? BUN 21 10/17/2020  ? NA 141 10/17/2020  ? K 3.8 10/17/2020  ? CL 104 10/17/2020  ?  CO2 30 10/17/2020  ? ?BP Readings from Last 3 Encounters:  ?02/26/21 120/78  ?01/19/21 (!) 166/84  ?12/26/20 (!) 152/82  ? ?Pulse Readings from Last 3 Encounters:  ?02/26/21 76  ?01/19/21 66  ?12/26/20 76  ? ? ? ?SDOH:  (Social Determinants of Health) assessments and interventions performed:  ? ? ?CCM Care Plan ? ?Review of patient past medical history, allergies, medications, health status, including review of consultants reports, laboratory and other test data, was performed as part of comprehensive evaluation and provision of chronic care management services.  ? ?Care Plan :  PharmD - Medication Managment  ?Updates made by [name], RPH-CPP since 04/20/2021 12:00 AM  ?  ? ?Problem: Disease Progression   ?  ? ?Long-Range Goal: Disease Progression Prevented or Minimized   ?Start Date: 01/19/2020  ?Expected End Date: 04/18/2020  ?Recent Progress: On track  ?Priority: High  ?Note:   ?Current Barriers:  ?Chronic Disease Management support, education, and care coordination needs related to hypertension, osteoarthritis, hyperlipidemia, insomnia and memory loss ?Cognitive Deficits ? ?Pharmacist Clinical Goal(s):  ?Over the next 90 days, caregiver/patient will adhere to plan to optimize therapeutic regimen for hypertension as evidenced by report of adherence to recommended medication management changes through collaboration with PharmD and provider.  ? ?Interventions: ?1:1 collaboration with [name], DO regarding development and update of comprehensive plan of care as evidenced by provider attestation and co-signature ?Inter-disciplinary care team collaboration (see longitudinal plan of care) ?Perform chart review ?Patient seen for Office Visit with Cardiology on 2/20 ?Palliative Care Home Visit on 3/14 ?Today daughter reports they are going to see surgeon regarding cyst on patient's arm next week ?Again encourage daughter to call to follow up with [facility] today to follow up regarding lymph pump recommended by provider on 1/13 ?Confirms has number and states will call again today ?Reports patient completed home health speech therapy and physical therapy, but patient continues to do some of the exercises that PT provided ?Expresses need for support in the home - someone to come Monday-Friday to help getting patient out of bed, dressed, takes medications and with getting patient breakfast and lunch meals ?Collaborate with CCM Nurse Case Manager ?Assist daughter with accessing list of [place] from [employer] website today ?Caregiver to  discuss further with CCM Nurse Case Manager during upcoming appointment ? ?Hypertension ?Patient followed by [facility] ?Reports patient taking: ?Enalapril 5 mg daily in the morning ?Furosemide 20 mg on Mondays, Wednesdays and Fridays ?Reports denies monitoring home BP recently ?Reports has not yet obtained the zip-up compression stockings, but planning to go online to purchase these ?Encourage caregiver to monitor home BP, keep log, bring this record to medical appointments and contact office for readings outside of established parameters ?   ?Medication Adherence ?Note patient using weekly pillbox with slots for four times daily dosing ?Patient continues to need assistance from family for remembering four times/day dosing ?Have discussed with caregiver additional strategies for using medication alarm as adherence aid  ?Reports that she and her mom manage patient's as needed medications ? ?Patient Goals/Self-Care Activities ?Over the next 90 days, caregiver/patient will:  ?- take medications as prescribed ?- check blood pressure, document, and provide at future appointments ? ?Follow Up Plan: Telephone follow up appointment with care management team member scheduled for: 07/13/2021 at 8:30 AM ?  ?  ? ?Wallace Cullens, PharmD, BCACP, CPP ?Clinical Pharmacist ?[facility] ?[place] ?[phone] ? ? ? ? ? ?

## 2021-04-20 NOTE — Patient Instructions (Signed)
Visit Information ? ?Thank you for taking time to visit with me today. Please don't hesitate to contact me if I can be of assistance to you before our next scheduled telephone appointment. ? ?Following are the goals we discussed today:  ? Goals Addressed   ? ?  ?  ?  ?  ? This Visit's Progress  ?  Pharmacy Goals     ?  Please continue to monitor home blood pressure and keep log of results and have this record to review during our next appointment.  ? ?Feel free to call me with any questions or concerns. I look forward to our next call! ? ? ?Wallace Cullens, PharmD, BCACP ?Clinical Pharmacist ?[facility] ?[place] ?[phone] ?  ? ?  ? ? ? ?Our next appointment is by telephone on 07/13/2021 at 8:30 AM ? ?Please call the care guide team at [phone] if you need to cancel or reschedule your appointment.  ? ? ?Patient verbalizes understanding of instructions and care plan provided today and agrees to view in [place]. Active MyChart status confirmed with patient.   ? ?

## 2021-04-23 ENCOUNTER — Ambulatory Visit: Payer: Self-pay

## 2021-04-23 ENCOUNTER — Telehealth: Payer: Medicare Other

## 2021-04-23 DIAGNOSIS — F0280 Dementia in other diseases classified elsewhere without behavioral disturbance: Secondary | ICD-10-CM

## 2021-04-23 DIAGNOSIS — I872 Venous insufficiency (chronic) (peripheral): Secondary | ICD-10-CM

## 2021-04-23 DIAGNOSIS — R413 Other amnesia: Secondary | ICD-10-CM

## 2021-04-23 DIAGNOSIS — I10 Essential (primary) hypertension: Secondary | ICD-10-CM

## 2021-04-23 DIAGNOSIS — K219 Gastro-esophageal reflux disease without esophagitis: Secondary | ICD-10-CM

## 2021-04-23 DIAGNOSIS — M6740 Ganglion, unspecified site: Secondary | ICD-10-CM

## 2021-04-23 DIAGNOSIS — E782 Mixed hyperlipidemia: Secondary | ICD-10-CM

## 2021-04-23 NOTE — Chronic Care Management (AMB) (Signed)
?Chronic Care Management  ? ?CCM RN Visit Note ? ?04/23/2021 ?Name: Randy [name] MRN: [id number] DOB: [birth year] ? ?Subjective: ?Randy [name] is a 87 y.o. year old male who is a primary care patient of Randy Hauser, DO. The care management team was consulted for assistance with disease management and care coordination needs.   ? ?Engaged with patient by telephone for follow up visit in response to provider referral for case management and/or care coordination services.  ? ?Consent to Services:  ?The patient was given information about Chronic Care Management services, agreed to services, and gave verbal consent prior to initiation of services.  Please see initial visit note for detailed documentation.  ? ?Patient agreed to services and verbal consent obtained.  ? ?Assessment: Review of patient past medical history, allergies, medications, health status, including review of consultants reports, laboratory and other test data, was performed as part of comprehensive evaluation and provision of chronic care management services.  ? ?SDOH (Social Determinants of Health) assessments and interventions performed:   ? ?CCM Care Plan ? ?Allergies  ?Allergen Reactions  ? Antihistamines, Chlorpheniramine-Type Other (See Comments)  ?  Can't urinate  ? ? ?Outpatient Encounter Medications as of 04/23/2021  ?Medication Sig  ? acetaminophen (TYLENOL) 500 MG tablet Take 1,000 mg by mouth every 6 (six) hours as needed for moderate pain.  ? carbidopa-levodopa (SINEMET CR) 50-200 MG tablet Take 1 tablet by mouth 4 (four) times daily.  ? cholecalciferol (VITAMIN D) 1000 units tablet Take 1,000 Units by mouth daily.  ? Coenzyme Q10 (CO Q 10) 100 MG CAPS Take 100 mg by mouth daily.   ? diclofenac Sodium (VOLTAREN) 1 % GEL APPLY 2 G TOPICALLY 4 (FOUR) TIMES DAILY AS NEEDED. TO KNEE ARTHRITIS PAIN  ? enalapril (VASOTEC) 5 MG tablet Take 1 tablet (5 mg total) by mouth daily.  ? fluticasone (FLONASE) 50 MCG/ACT nasal spray Place 2  sprays into both nostrils as needed for allergies.  ? furosemide (LASIX) 20 MG tablet Take 1 tablet (20 mg total) by mouth every other day.  ? Multiple Vitamin (MULTIVITAMIN WITH MINERALS) TABS tablet Take 1 tablet by mouth daily.  ? Omega-3 Fatty Acids (FISH OIL) 1200 MG CAPS Take 1,200 mg by mouth in the morning, at noon, in the evening, and at bedtime.  ? omeprazole (PRILOSEC) 20 MG capsule Take 40 mg by mouth daily.  ? rivastigmine (EXELON) 3 MG capsule Take 3 mg by mouth 2 (two) times daily.  ? sertraline (ZOLOFT) 50 MG tablet Take 50 mg by mouth at bedtime.  ? traMADol (ULTRAM) 50 MG tablet Take 1 tablet (50 mg total) by mouth every 8 (eight) hours as needed.  ? vitamin C (ASCORBIC ACID) 500 MG tablet Take 500 mg by mouth daily.  ? zinc sulfate 220 (50 Zn) MG capsule Take 1 capsule (220 mg total) by mouth daily.  ? ?No facility-administered encounter medications on file as of 04/23/2021.  ? ? ?Patient Active Problem List  ? Diagnosis Date Noted  ? Swelling of limb 12/26/2020  ? Bilateral primary osteoarthritis of knee 02/15/2020  ? Pressure injury of buttock, stage 1   ? Delirium   ? COVID-19 virus infection 10/08/2019  ? Unable to care for self 10/08/2019  ? Caregiver unable to cope 10/08/2019  ? Recurrent falls 10/08/2019  ? Lewy body dementia without behavioral disturbance (Randy [name])   ? Prostate cancer (Randy [name]) 06/30/2019  ? BPH with urinary obstruction 11/06/2018  ? Acute cholecystitis 09/08/2018  ? Myalgia  due to statin 02/24/2018  ? Tricompartment osteoarthritis of left knee 11/14/2017  ? Generalized weakness 04/28/2017  ? Chronic low back pain 03/12/2017  ? Knee pain, chronic 10/01/2016  ? Chronic venous insufficiency 08/30/2016  ? BPH without obstruction/lower urinary tract symptoms 03/06/2016  ? Osteoarthritis of multiple joints 03/06/2016  ? Degenerative joint disease (DJD) of lumbar spine 03/06/2016  ? Essential hypertension 03/05/2016  ? Hyperlipidemia 03/05/2016  ? Osteoporosis 03/05/2016  ? GERD  (gastroesophageal reflux disease) 03/05/2016  ? Bilateral lower extremity edema 03/05/2016  ? Dupuytren's disease of palm 05/28/2015  ? ? ?Conditions to be addressed/monitored:HTN, HLD, Dementia, and ganglion cyst to arm, GERD, Venous Insufficiency  ? ?Care Plan : RNCM: General Plan of Care (Adult) For Chronic Disease Management and Care Coordination Needs  ?Updates made by Vanita Ingles, RN since 04/23/2021 12:00 AM  ?  ? ?Problem: RNCM; Development of Plan of Care for Chronic Disease Management ( lewy body dementia, HTN, HLD, Urinary Incontinence)   ?Priority: High  ?  ? ?Long-Range Goal: RNCM; Effective Management  of Plan of Care for Chronic Disease Management ( lewy body dementia, HTN, HLD, Urinary Incontinence)   ?Start Date: 11/13/2020  ?Expected End Date: 11/13/2021  ?Priority: High  ?Note:   ?Current Barriers:  ?Knowledge Deficits related to plan of care for management of HTN, HLD, and Demential and Urinary Incontinence with enlarged prostate  ?Care Coordination needs related to Memory Deficits and family support and education due to Lewy Body Dementia  ?Chronic Disease Management support and education needs related to HTN, HLD, and Dementia and urinary Incontinence  ?Cognitive Deficits ?Ganglion cyst evaluated by [facility] NP, Ralene Bathe on 03-20-2021. 04-23-2021: Will see the surgeon on 04-24-2021 per his daughter. She states she is unsure if they will elect to have it surgically removed because it may impede his ability to get up. He is already having a difficult time with mobility. Education and support provided today. ? ?RNCM Clinical Goal(s):  ?Patient will verbalize understanding of plan for management of HTN, HLD, Dementia, and Urinary Incontinence with recent bladder stone as evidenced by effective management of chronic condtions without any exacerbations and maintaining health and well being ?take all medications exactly as prescribed and will call provider for medication related questions as  evidenced by compliance with medications and working with the pharm D for questions or concerns related to medications    ?attend all scheduled medical appointments: no upcoming with pcp but has with specialist  as evidenced by keeping appointments and calling the office for changes in condition or new concerns that need to be addressed         ?continue to work with Consulting civil engineer and/or Social Worker to address care management and care coordination needs related to HTN, HLD, Dementia, and Urinary incontinence  as evidenced by adherence to CM Team Scheduled appointments     ?demonstrate a decrease in Urinary incontinence with recent bladder stone, and changes in memory due to lewy body demential exacerbations  as evidenced by stable conditions, no acute exacerbations and working with the CCM team to manage health and well being ?demonstrate ongoing self health care management ability of effective management of chronic conditions as evidenced by  working with the CCM team  through collaboration with Consulting civil engineer, provider, and care team.  ? ?Interventions: ?1:1 collaboration with primary care provider regarding development and update of comprehensive plan of care as evidenced by provider attestation and co-signature ?Inter-disciplinary care team collaboration (see longitudinal plan  of care) ?Evaluation of current treatment plan related to  self management and patient's adherence to plan as established by provider ? ? ?SDOH Barriers (Status: Goal on Track (progressing): YES.) Long Term Goal  ?Patient interviewed and SDOH assessment performed ?       ?Patient interviewed and appropriate assessments performed ?Provided patient with information about resources in the county and care guides avaialble to assist with changes in SDOH or new needs as they arise ?Discussed plans with patient for ongoing care management follow up and provided patient with direct contact information for care management team ?Advised patient  to call the office for changes in SDOH, Question, or Concerns ?Provided education to patient/caregiver regarding level of care options. ?04-23-2021: Review of resources provided to the patients daughter, [name] by the

## 2021-04-23 NOTE — Patient Instructions (Signed)
Visit Information ? ?Thank you for taking time to visit with me today. Please don't hesitate to contact me if I can be of assistance to you before our next scheduled telephone appointment. ? ?Following are the goals we discussed today:  ?RNCM Clinical Goal(s):  ?Patient will verbalize understanding of plan for management of HTN, HLD, Dementia, and Urinary Incontinence with recent bladder stone as evidenced by effective management of chronic condtions without any exacerbations and maintaining health and well being ?take all medications exactly as prescribed and will call provider for medication related questions as evidenced by compliance with medications and working with the pharm D for questions or concerns related to medications    ?attend all scheduled medical appointments: no upcoming with pcp but has with specialist  as evidenced by keeping appointments and calling the office for changes in condition or new concerns that need to be addressed         ?continue to work with Consulting civil engineer and/or Social Worker to address care management and care coordination needs related to HTN, HLD, Dementia, and Urinary incontinence  as evidenced by adherence to CM Team Scheduled appointments     ?demonstrate a decrease in Urinary incontinence with recent bladder stone, and changes in memory due to lewy body demential exacerbations  as evidenced by stable conditions, no acute exacerbations and working with the CCM team to manage health and well being ?demonstrate ongoing self health care management ability of effective management of chronic conditions as evidenced by  working with the CCM team  through collaboration with Consulting civil engineer, provider, and care team.  ?  ?Interventions: ?1:1 collaboration with primary care provider regarding development and update of comprehensive plan of care as evidenced by provider attestation and co-signature ?Inter-disciplinary care team collaboration (see longitudinal plan of  care) ?Evaluation of current treatment plan related to  self management and patient's adherence to plan as established by provider ?  ?  ?SDOH Barriers (Status: Goal on Track (progressing): YES.) Long Term Goal  ?Patient interviewed and SDOH assessment performed ?       ?Patient interviewed and appropriate assessments performed ?Provided patient with information about resources in the county and care guides avaialble to assist with changes in SDOH or new needs as they arise ?Discussed plans with patient for ongoing care management follow up and provided patient with direct contact information for care management team ?Advised patient to call the office for changes in SDOH, Question, or Concerns ?Provided education to patient/caregiver regarding level of care options. ?04-23-2021: Review of resources provided to the patients daughter, [name] by the pharm D last week. She has looked at the information for help in the home but has not called any of the organizations yet. Review of Oncologist. Also review of [employer]. 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  ?  ?  ?  ?Urinary Incontinence with enlarged prostate and recent bladder stone  (Status: Goal Met. No Needs Identified this visit) Long Term Goal Closing this goal and will re-visit if new concerns 03-12-2021 ?Evaluation of current treatment plan related to  Urinary incontinence with enlarged prostate and recent bladder stone  , Level of care concerns and Memory Deficits self-management and patient's adherence to plan as established by provider. 11-13-2020: The patient recently had bladder stone removed on 10-20-2020. The patients daughter states that since removal of the bladder stone the patient denies  any pain on urination and states he feels better. The daughter has taken the patient to follow ups with urologist. Review of sx and sx to monitor for changes in urinary  health and well being. 01-15-2021: The patients daughter states that he is doing well since the procedure last year. He has not had anymore complaints or issues related to urinary retention. Will continue to monitor for changes.  ?Discussed plans with patient for ongoing care management follow up and provided patient with direct contact information for care management team ?Advised patient to call the office for changes in urinary health, questions or concerns; ?Provided education to patient re: monitoring for sx and sx of UTI, increased pain on urinarion, new concerns related to urinary incontinence. 01-15-2021: Review and the patient or daughter denies any issues related to urinary retention or concerns ; ?Reviewed medications with patient and discussed compliance, the wife and daughter assist with medications management. 01-15-2021: The daughter states compliance with medications  ; ?Reviewed scheduled/upcoming provider appointments including Decemember 2022, with Urology specialist  The patien thas no appointment with the pcp coming up but education provided to the daughter to call the office for changes. 01-15-2021: Follows up with the provider as needed. Denies any new concerns with urinary retention; ?Discussed plans with patient for ongoing care management follow up and provided patient with direct contact information for care management team; ?Advised patient to discuss new development of urinary sx and sx with provider; ?Screening for signs and symptoms of depression related to chronic disease state;  ?Assessed social determinant of health barriers;  ?  ?Hyperlipidemia:  (Status: Goal on Track (progressing): YES.) ?     ?Lab Results  ?Component Value Date  ?  CHOL 156 07/17/2020  ?  HDL 37 (L) 07/17/2020  ?  LDLCALC 105 (H) 07/17/2020  ?  TRIG 71 07/17/2020  ?  CHOLHDL 4.2 07/17/2020  ?  ?  ?Medication review performed; medication list updated in electronic medical record. 04-23-2021: Takes fish oil ?Provider  established cholesterol goals reviewed; ?Counseled on importance of regular laboratory monitoring as prescribed. 04-23-2021: Review of the need to have regular lab work; ?Provided HLD educational materials; ?Reviewed role and benefits of statin for ASCVD risk reduction; ?Discussed strategies to manage statin-induced myalgias; ?Reviewed importance of limiting foods high in cholesterol. 01-15-2021: The patients daughter states that the patient is not eating as well as he once was. She states partly because she things he does not want to prepare it or he forgets. His wife makes sure he eats when she is up but her daughter is working a lot when he is up and doing daily activities. Education and support given. 03-12-2021: The patient is eating well now. Does complain some of pain after eating. Discussed eating slow, sitting upright when eating and not laying down for 30 to one hour after eating to prevent issues with GERD. Education and support given. 04-23-2021: The patient has a good appetite. Per his daughter he has not had any further reports of his abdomen hurting after eating or at other times. Will continue to monitor.  ?  ?Hypertension: (Status: Goal on Track (progressing): YES.) ?Last practice recorded BP readings:  ?   ?BP Readings from Last 3 Encounters:  ?02/26/21 120/78  ?01/19/21 (!) 166/84  ?12/26/20 (!) 152/82  ?Home visit on 03-20-2021 by [facility]: 130/90 with pulse of 86 ?Most recent eGFR/CrCl: No results found for: EGFR  No components found for: CRCL ?  ?Evaluation of current treatment plan related to  hypertension self management and patient's adherence to plan as established by provider. 11-13-2020: The patient has long standing issues with his blood pressures fluctuating. The patient does take his blood pressures at home. The patients daughter and wife assist with his care. States compliance with medications. Will continue to monitor. 01-15-2021: The patient is still having fluctuations in his blood  pressures. The patient saw Dr. Lucky Cowboy recently for venous insufficiency and will have further testing on 01-19-2021: The patients daughter states his right leg is more swollen that the left. See new disease process under venous insuf

## 2021-05-06 DIAGNOSIS — F0280 Dementia in other diseases classified elsewhere without behavioral disturbance: Secondary | ICD-10-CM

## 2021-05-06 DIAGNOSIS — Z87891 Personal history of nicotine dependence: Secondary | ICD-10-CM

## 2021-05-06 DIAGNOSIS — I10 Essential (primary) hypertension: Secondary | ICD-10-CM

## 2021-05-06 DIAGNOSIS — G3183 Dementia with Lewy bodies: Secondary | ICD-10-CM | POA: Diagnosis not present

## 2021-05-06 DIAGNOSIS — E782 Mixed hyperlipidemia: Secondary | ICD-10-CM

## 2021-05-08 ENCOUNTER — Telehealth: Payer: Medicare Other

## 2021-05-15 ENCOUNTER — Telehealth: Payer: Medicare Other

## 2021-05-18 ENCOUNTER — Telehealth: Payer: Self-pay | Admitting: Licensed Clinical Social Worker

## 2021-05-18 NOTE — Telephone Encounter (Signed)
? ?   Clinical Social Work  ?Care Management  ? Phone Outreach  ? ? ?05/18/2021 ?Name: IZAYIAH TIBBITTS MRN: [id number] DOB: 04/03/1934 ? ?NISAIAH BECHTOL is a 87 y.o. year old male who is a primary care patient of [name], DO .  ? ?Reason for referral: Caregiver Stress.   ? ?F/U phone call today to assess needs, progress and barriers with care plan goals.   Telephone outreach was unsuccessful. A HIPPA compliant phone message was left for the patient providing contact information and requesting a return call.  ? ?Plan:CCM LCSW will wait for return call. If no return call is received, Will route chart to Care Guide to see if patient would like to reschedule phone appointment  ? ?Review of patient status, including review of consultants reports, relevant laboratory and other test results, and collaboration with appropriate care team members and the patient's provider was performed as part of comprehensive patient evaluation and provision of care management services.   ? ?Christa See, MSW, LCSW ?[facility] Care Management ?[facility] ?[email or web address] ?Phone [phone] ?6:31 AM ? ? ? ?  ? ? ? ? ?

## 2021-06-11 ENCOUNTER — Ambulatory Visit (INDEPENDENT_AMBULATORY_CARE_PROVIDER_SITE_OTHER): Payer: Medicare Other

## 2021-06-11 ENCOUNTER — Telehealth: Payer: Medicare Other

## 2021-06-11 DIAGNOSIS — F0280 Dementia in other diseases classified elsewhere without behavioral disturbance: Secondary | ICD-10-CM

## 2021-06-11 DIAGNOSIS — R413 Other amnesia: Secondary | ICD-10-CM

## 2021-06-11 DIAGNOSIS — I872 Venous insufficiency (chronic) (peripheral): Secondary | ICD-10-CM

## 2021-06-11 DIAGNOSIS — E782 Mixed hyperlipidemia: Secondary | ICD-10-CM

## 2021-06-11 DIAGNOSIS — Z9181 History of falling: Secondary | ICD-10-CM

## 2021-06-11 DIAGNOSIS — K219 Gastro-esophageal reflux disease without esophagitis: Secondary | ICD-10-CM

## 2021-06-11 DIAGNOSIS — I10 Essential (primary) hypertension: Secondary | ICD-10-CM

## 2021-06-11 NOTE — Patient Instructions (Signed)
Visit Information  Thank you for taking time to visit with me today. Please don't hesitate to contact me if I can be of assistance to you before our next scheduled telephone appointment.  Following are the goals we discussed today:  Hyperlipidemia:  (Status: Goal on Track (progressing): YES.)      Lab Results  Component Value Date    CHOL 156 07/17/2020    HDL 37 (L) 07/17/2020    LDLCALC 105 (H) 07/17/2020    TRIG 71 07/17/2020    CHOLHDL 4.2 07/17/2020      Medication review performed; medication list updated in electronic medical record. 06-11-2021: Takes fish oil Provider established cholesterol goals reviewed; Counseled on importance of regular laboratory monitoring as prescribed. 06-11-2021: Review of the need to have regular lab work. Next month will be one year since labs. Education and support; Provided HLD Scientist, clinical (histocompatibility and immunogenetics); Reviewed role and benefits of statin for ASCVD risk reduction; Discussed strategies to manage statin-induced myalgias; Reviewed importance of limiting foods high in cholesterol. 01-15-2021: The patients daughter states that the patient is not eating as well as he once was. She states partly because she things he does not want to prepare it or he forgets. His wife makes sure he eats when she is up but her daughter is working a lot when he is up and doing daily activities. Education and support given. 03-12-2021: The patient is eating well now. Does complain some of pain after eating. Discussed eating slow, sitting upright when eating and not laying down for 30 to one hour after eating to prevent issues with GERD. Education and support given. 04-23-2021: The patient has a good appetite. Per his daughter he has not had any further reports of his abdomen hurting after eating or at other times. Will continue to monitor. 06-11-2021: The daughter is happy with the caregiver coming in Monday through Friday for 3 hours a day. She makes sure he gets up, eats, and is active.  She  also makes sure he takes his medications. This has been a positive change for the patient.    Hypertension: (Status: Goal on Track (progressing): YES.) Last practice recorded BP readings:     BP Readings from Last 3 Encounters:  02/26/21 120/78  01/19/21 (!) 166/84  12/26/20 (!) 152/82  Home visit on 03-20-2021 by [facility]: 130/90 with pulse of 86 Most recent eGFR/CrCl: No results found for: EGFR  No components found for: CRCL   Evaluation of current treatment plan related to hypertension self management and patient's adherence to plan as established by provider. 11-13-2020: The patient has long standing issues with his blood pressures fluctuating. The patient does take his blood pressures at home. The patients daughter and wife assist with his care. States compliance with medications. Will continue to monitor. 01-15-2021: The patient is still having fluctuations in his blood pressures. The patient saw Dr. Lucky Cowboy recently for venous insufficiency and will have further testing on 01-19-2021: The patients daughter states his right leg is more swollen that the left. See new disease process under venous insufficiency.   03-12-2021: The patient saw the cardiologist recently and got a good check up. Does not have to see cardiology for one year. The patient did see vascular specialist a few months ago and it was recommended the patient get a lymphedema machine. The daughter is going to follow up on this as she has not heard back from the provider on this. Education and support given. 03-26-2021: The patient was evaluated by NP, Joellen Jersey  Tamala Julian in home on 03-20-2021. Blood pressure was 130/90 and pulse was 86. The patients weight was 208. No new findings noted. Will continue to monitor. 06-11-2021: The patient is having more stable blood pressure readings. No acute findings related to HTN or heart health.  Reviewed prescribed diet heart healthy diet. 06-11-2021: Review of heart healthy diet. Is eating better since caregiver is  in the home 5 days a week.  Reviewed medications with patient and discussed importance of compliance. 06-11-2021: Review of medications compliance and the patients daughter verbalized compliance. No recent changes in medications.   Discussed plans with patient for ongoing care management follow up and provided patient with direct contact information for care management team; Advised patient, providing education and rationale, to monitor blood pressure daily and record, calling PCP for findings outside established parameters;  Advised patient to discuss blood pressure trends  with provider; Provided education on prescribed diet heart healthy diet ;  Discussed complications of poorly controlled blood pressure such as heart disease, stroke, circulatory complications, vision complications, kidney impairment, sexual dysfunction;      Venous Insufficiency   (Status: Goal on Track (progressing): YES.) Long Term Goal  Evaluation of current treatment plan related to  Venous Insufficiency ,  self-management and patient's adherence to plan as established by provider. 03-12-2021: The patient is still having edema in his legs. The daughter states they have not heard back from the lymph edema machine and she will call and follow up on this. The patient does have pain and discomfort in his legs. Education and support given. 06-11-2021: I the patients venous insufficiency is stable. He is more active with the help of caregiver. Walks down the ramp at his home when she is there.  Discussed plans with patient for ongoing care management follow up and provided patient with direct contact information for care management team Advised patient to call the office for increased swelling, broken areas, or changes in temperature to the extremities; Provided education to patient re: venous insufficiency and sx and sx of decreased circulations, factors that the patient needs to alert the provider, and keeping appointments with the  provider for further testing and evaluation; Reviewed medications with patient and discussed compliance. 06-11-2021: The patient is compliant with medications ; Provided patient with ways to decrease swelling  educational materials related to venous insufficiency and changes in circulation to extremities; Reviewed scheduled/upcoming provider appointments including has ultrasound on bilateral legs this week 01-19-2021 and will follow up with Dr. Lucky Cowboy after the ultrasound. Knows to call the office for changes in conditions, questions, or concerns. Saw cardiologist on 02-26-2021 and got a good report. Daughter will follow up with the vascular provider for updated information on lymph edema machine to help with swelling in the patients legs. 06-11-2021:: saw specialist recently. No changes in the plan of care. Knows to call for new needs ; Discussed plans with patient for ongoing care management follow up and provided patient with direct contact information for care management team; Screening for signs and symptoms of depression related to chronic disease state;  Assessed social determinant of health barriers;         Lewy Body Dementia   (Status: Goal on Track (progressing): YES.) Long Term Goal  Evaluation of current treatment plan related to Dementia, Level of care concerns and Memory Deficits self-management and patient's adherence to plan as established by provider. 11-13-2020: The patient saw the neurologist recently and had changes in medications. A consult for speech therapy was made. The  patients daughter states that she has not heard from them yet. Sometimes the patient has a hard time with his words and needs. Education and support given. Will continue to monitor. 01-15-2021: The patient had changes in medications and therefore it caused some nausea with vomiting. The patient saw the pcp in December and had an increase in his Omeprazole to 40 mg daily. Since the increase the patients daughter states he has  hand no further episodes of nausea with vomiting. The patient is stable as far as his memory and cognition. The daughter knows to call for changes, questions, or concerns. 03-12-2021: The patients daughter states this is stable. The patient has good days and bad days. Is not as mobile and sometimes has a better memory than other days. Does not go outside a lot. Denies any acute findings related to dementia and changes. 03-26-2021: The patient has good days and bad days. The daughter feels she is going to have to get help in the home sooner than later due to the patients mobility and decline. The patients wife told her daughter that sometimes he sits on the side of the bed for hours because he cannot get up due to his knees being painful. Empathetic listening and support given. 04-23-2021: The patients daughter is interested in help in the home Monday through Friday. She states that cost may be a factor. [facility] information provided and the daughter has written down information to call several placed. Support given. The patient sometimes sleeps late and sometimes it takes him a while to get up. The patient has a lot of knee pain and also the cyst they will see the surgeon about tomorrow. Empathetic listening and support. The daughter wants to find help that will be beneficial for the patient and aide in his needs. 06-11-2021: Saw the neurologist recently and no changes in medications. The daughter states he is stable at this time. She says his mood is better since having Sharyn Lull, the paid caregiver come into the home. The patient daughter states this has been a positive change for him. Education and support given.  Discussed plans with patient for ongoing care management follow up and provided patient with direct contact information for care management team Advised patient to call the office for changes in memory, depression, anxiety, mood; Provided education to patient re: The 36 hour day  resources for caregivers that have loved ones with dementia; Reviewed medications with patient and discussed compliance. The patient saw the neurologist recently and his rivastigmine was increased from the original 1.5 mg BID. 01-15-2021: The Rivastigmine is working well for the patient but he was having new onset of nausea with vomiting. The patients daughter states that since the Omeprazole was increased to 40 mg that the patient is doing better and no other concerns with N&V. 06-11-2021: Is compliant with medications. No current changes in medications. Will continue to monitor. Provided patient with resources to help with understanding Lewy body dementia educational materials related to progression of lewy body dementia; Reviewed scheduled/upcoming provider appointments including saw neurologist recently and keeps appointments as scheduled, knows to call the pcp office for new needs or concerns. 01-15-2021: The patients daughter knows to call the office for changes in conditions, questions, or concerns. The patient sees the specialist on a regular basis. 03-26-2021: Discussed with the patients daughter today about the possibility of coming in and seeing the pcp for the ganglion cyst or seeing what the recommendations are for removal. 03-27-2021: Call made back  to the patients daughter today after collaboration with the pcp. General surgery cannot do ganglion cyst and recommended orthopedics. New referral sent to [facility] for referral for assessment and treatment options for ganglion cyst., The daughter verbalized understanding. 04-23-2021: The daughter states that they have an appointment to see the surgeon tomorrow about the ganglion cyst that he has on his arm. She states her and her mother have discussed and do not know if it would be the best for him to have it removed. Education on keeping the appointment and seeing what the surgeon recommends. 06-11-2021: Saw specialist recently. Keeps appointments. Denies any  new concerns at this time. Knows to call for changes.  Discussed plans with patient for ongoing care management follow up and provided patient with direct contact information for care management team; Advised patient to discuss progression and changes with provider; Screening for signs and symptoms of depression related to chronic disease state;  Assessed social determinant of health barriers;       GERD  (Status: Goal on Track (progressing): YES.) Long Term Goal  Evaluation of current treatment plan related to GERD,  self-management and patient's adherence to plan as established by provider. 06-11-2021: Denies any issues with GERD at this time. The patient is eating. Denies any concerns with dietary restrictions.  Discussed plans with patient for ongoing care management follow up and provided patient with direct contact information for care management team Advised patient to sit upright when eating, do not lay down 30 minutes to an hour after eating, take medications as directed, avoid spicy foods to prevent exacerbation of GERD. The daughter states the patient has been complaining of his stomach hurting after eating; Provided education to patient re: GERD exacerbations and factors that cause exacerbation, changes to monitor for, precautions and safety; Reviewed medications with patient and discussed compliance. The patients daughter states the patient is compliant with medications.; Discussed plans with patient for ongoing care management follow up and provided patient with direct contact information for care management team; Advised patient to discuss changes in eating habits, changes in sx and sx associated with GERD, Questions and concerns with provider; Screening for signs and symptoms of depression related to chronic disease state;  Assessed social determinant of health barriers;       Growth on Left arm: Ganglion Cyst (Status: Goal Met.) Short Term Goal  Clsoing this goal. No new concerns  related to ganglion cyst.  Evaluation of current treatment plan related to  Growth on left arm ,  self-management and patient's adherence to plan as established by provider. 03-26-2021: The patient was seen by NP, Ralene Bathe on 03-20-2021 and she reported back to the [facility] and pcp that the area is a 5 cm area "ganglion cyst". Likely from where the patient had one removed several years ago. The patient states it is painful when it is touched and sometimes wakes him from his sleep. The patient would like it drained or removed. A call was made to the patients daughter after she left a Voice mail asking for return call after the visit from the NP. RNCM  read the notes to the patients daughter and she feels like it needs to be removed. The RNCM reached out to the pcp by secure chat to get recommendations. The pcp recommends since they want the area to be excised that a consult be made to [place], as the pcp could only drain it in the office. RNCM called the patients daughter back and she  agrees to the consult with [facility]. Pcp will place the order for [facility] for further evaluation and treatment of the  ganglion cyst to the patients left arm. 04-23-2021: Sees the surgeon tomorrow for evaluation and recommendations. The daughter and the patients wife are unsure if the patient should have surgery to have the cyst removed. Encouraged her to keep the appointment and discuss concerns with the surgeon. Discussed plans with patient for ongoing care management follow up and provided patient with direct contact information for care management team Advised patient to call the office for changes in area, questions and concerns about the growth; Provided education to patient re: left arm growth, review of sx and sx presented, if pain is present, previous interventions from several years ago; Collaborated with pcp regarding recommendations for evaluation and treatment  options to the left arm-growth. In basket message sent to the pcp asking for recommendations for left arm growth. The patients daughter states the area is the size of an orange and getting bigger the patient says it hurts sometimes. The patient had surgical intervention before but it has been several years. The patients daughter is asking for pcp recommendations. Will collaborate with the pcp and get back with the patients daughter. Call made back to the daughter after collaboration with the pcp and gave options. [facility] NP Ralene Bathe will see the patient next week and can collaborate back with the pcp and get a referral. The daughter agrees with this plan and will follow up accordingly. Will send a message to NP Ralene Bathe with [facility] and let pcp know the desires of the patient and patients daughter. 03-26-2021: Have discussed assessment by the NP and discussed with pcp next steps after speaking to the patients daughter. The pcp will place a referral for [facility] to have the area assessed and offer treatment options. 03-27-2021: After collaboration and information received from the pcp, the RNCM called the patients daughter back and let her know that she would be hearing from [place] as general surgery does not do ganglion cyst. The daughter verbalized understanding.  ; Discussed plans with patient for ongoing care management follow up and provided patient with direct contact information for care management team; Message information from Dr. Parks Ranger: I have sent new referral to [facility]  Address: [address] [place], [place] [address]  Hours:  9AM-5PM  Phone: [phone]  Information communicated to [facility] today via telephone. The daughter verbalized understanding of instructions provided and that [facility] would be calling for an appointment.      Falls:  (Status: Goal on Track (progressing): YES.)  Long Term Goal  Provided written and verbal education re: potential causes of falls and Fall prevention strategies Reviewed medications and discussed potential side effects of medications such as dizziness and frequent urination Advised patient of importance of notifying provider of falls. 06-11-2021: Per [name] the patient has fallen x2 since the last outreach. Education on notifying the provider of new falls.  Assessed for signs and symptoms of orthostatic hypotension Assessed for falls since last encounter. 06-11-2021: The patient has had 2 falls since last outreach in April. The daughter does not believe he hit his head. He is acting his usual self. Education and support given.  Assessed patients knowledge of fall risk prevention secondary to previously provided education Provided patient information for fall alert systems Assessed working status of life alert bracelet and patient adherence Advised  patient to discuss discuss fall and safety prevention  with provider Screening for signs and symptoms of depression related to chronic disease state Assessed social determinant of health barriers     Our next appointment is by telephone on 08-13-2021 at 0900 am  Please call the care guide team at [phone] if you need to cancel or reschedule your appointment.   If you are experiencing a Mental Health or [place] or need someone to talk to, please call the Suicide and Crisis Lifeline: 988 call the [place] National Suicide Prevention Lifeline: 845 399 3644 or TTY: 732 350 8986 TTY 310 170 4419) to talk to a trained counselor call 1-800-273-TALK (toll free, 24 hour hotline)   Patient verbalizes understanding of instructions and care plan provided today and agrees to view in [place]. Active MyChart status and patient understanding of how to access instructions and care plan via MyChart confirmed with patient.     Noreene Larsson RN, MSN, [place] Mobile: [phone]

## 2021-06-11 NOTE — Chronic Care Management (AMB) (Signed)
Chronic Care Management   CCM RN Visit Note  06/11/2021 Name: Randy [name] MRN: [id number] DOB: 10/23/1934  Subjective: Randy [name] is a 87 y.o. year old male who is a primary care patient of [name], DO. The care management team was consulted for assistance with disease management and care coordination needs.    Engaged with patient by telephone for follow up visit in response to provider referral for case management and/or care coordination services.   Consent to Services:  The patient was given information about Chronic Care Management services, agreed to services, and gave verbal consent prior to initiation of services.  Please see initial visit note for detailed documentation.   Patient agreed to services and verbal consent obtained.   Assessment: Review of patient past medical history, allergies, medications, health status, including review of consultants reports, laboratory and other test data, was performed as part of comprehensive evaluation and provision of chronic care management services.   SDOH (Social Determinants of Health) assessments and interventions performed:    CCM Care Plan  Allergies  Allergen Reactions   Antihistamines, Chlorpheniramine-Type Other (See Comments)    Can't urinate    Outpatient Encounter Medications as of 06/11/2021  Medication Sig   acetaminophen (TYLENOL) 500 MG tablet Take 1,000 mg by mouth every 6 (six) hours as needed for moderate pain.   carbidopa-levodopa (SINEMET CR) 50-200 MG tablet Take 1 tablet by mouth 4 (four) times daily.   cholecalciferol (VITAMIN D) 1000 units tablet Take 1,000 Units by mouth daily.   Coenzyme Q10 (CO Q 10) 100 MG CAPS Take 100 mg by mouth daily.    diclofenac Sodium (VOLTAREN) 1 % GEL APPLY 2 G TOPICALLY 4 (FOUR) TIMES DAILY AS NEEDED. TO KNEE ARTHRITIS PAIN   enalapril (VASOTEC) 5 MG tablet Take 1 tablet (5 mg total) by mouth daily.   fluticasone (FLONASE) 50 MCG/ACT nasal spray Place 2 sprays  into both nostrils as needed for allergies.   furosemide (LASIX) 20 MG tablet Take 1 tablet (20 mg total) by mouth every other day.   Multiple Vitamin (MULTIVITAMIN WITH MINERALS) TABS tablet Take 1 tablet by mouth daily.   Omega-3 Fatty Acids (FISH OIL) 1200 MG CAPS Take 1,200 mg by mouth in the morning, at noon, in the evening, and at bedtime.   omeprazole (PRILOSEC) 20 MG capsule Take 40 mg by mouth daily.   rivastigmine (EXELON) 3 MG capsule Take 3 mg by mouth 2 (two) times daily.   sertraline (ZOLOFT) 50 MG tablet Take 50 mg by mouth at bedtime.   traMADol (ULTRAM) 50 MG tablet Take 1 tablet (50 mg total) by mouth every 8 (eight) hours as needed.   vitamin C (ASCORBIC ACID) 500 MG tablet Take 500 mg by mouth daily.   zinc sulfate 220 (50 Zn) MG capsule Take 1 capsule (220 mg total) by mouth daily.   No facility-administered encounter medications on file as of 06/11/2021.    Patient Active Problem List   Diagnosis Date Noted   Swelling of limb 12/26/2020   Bilateral primary osteoarthritis of knee 02/15/2020   Pressure injury of buttock, stage 1    Delirium    COVID-19 virus infection 10/08/2019   Unable to care for self 10/08/2019   Caregiver unable to cope 10/08/2019   Recurrent falls 10/08/2019   Lewy body dementia without behavioral disturbance [facility])    Prostate cancer ([place]) 06/30/2019   BPH with urinary obstruction 11/06/2018   Acute cholecystitis 09/08/2018   Myalgia  due to statin 02/24/2018   Tricompartment osteoarthritis of left knee 11/14/2017   Generalized weakness 04/28/2017   Chronic low back pain 03/12/2017   Knee pain, chronic 10/01/2016   Chronic venous insufficiency 08/30/2016   BPH without obstruction/lower urinary tract symptoms 03/06/2016   Osteoarthritis of multiple joints 03/06/2016   Degenerative joint disease (DJD) of lumbar spine 03/06/2016   Essential hypertension 03/05/2016   Hyperlipidemia 03/05/2016   Osteoporosis 03/05/2016   GERD  (gastroesophageal reflux disease) 03/05/2016   Bilateral lower extremity edema 03/05/2016   Dupuytren's disease of palm 05/28/2015    Conditions to be addressed/monitored:HTN, HLD, Dementia, and GERD, Urinary incontinence, falls and safety, venous insufficiency  Care Plan : RNCM: General Plan of Care (Adult) For Chronic Disease Management and Care Coordination Needs  Updates made by Vanita Ingles, RN since 06/11/2021 12:00 AM     Problem: RNCM; Development of Plan of Care for Chronic Disease Management ( lewy body dementia, HTN, HLD, Urinary Incontinence)   Priority: High     Long-Range Goal: RNCM; Effective Management  of Plan of Care for Chronic Disease Management ( lewy body dementia, HTN, HLD, Urinary Incontinence)   Start Date: 11/13/2020  Expected End Date: 11/13/2021  Priority: High  Note:   Current Barriers:  Knowledge Deficits related to plan of care for management of HTN, HLD, and Demential and Urinary Incontinence with enlarged prostate  Care Coordination needs related to Memory Deficits and family support and education due to Lewy Body Dementia  Chronic Disease Management support and education needs related to HTN, HLD, and Dementia and urinary Incontinence  Cognitive Deficits Ganglion cyst evaluated by [facility] NP, Ralene Bathe on 03-20-2021. 04-23-2021: Will see the surgeon on 04-24-2021 per his daughter. She states she is unsure if they will elect to have it surgically removed because it may impede his ability to get up. He is already having a difficult time with mobility. Education and support provided today.  RNCM Clinical Goal(s):  Patient will verbalize understanding of plan for management of HTN, HLD, Dementia, and Urinary Incontinence with recent bladder stone as evidenced by effective management of chronic condtions without any exacerbations and maintaining health and well being take all medications exactly as prescribed and will call provider for medication related  questions as evidenced by compliance with medications and working with the pharm D for questions or concerns related to medications    attend all scheduled medical appointments: no upcoming with pcp but has with specialist  as evidenced by keeping appointments and calling the office for changes in condition or new concerns that need to be addressed         continue to work with [place] and/or Social Worker to address care management and care coordination needs related to HTN, HLD, Dementia, and Urinary incontinence  as evidenced by adherence to CM Team Scheduled appointments     demonstrate a decrease in Urinary incontinence with recent bladder stone, and changes in memory due to lewy body demential exacerbations  as evidenced by stable conditions, no acute exacerbations and working with the CCM team to manage health and well being demonstrate ongoing self health care management ability of effective management of chronic conditions as evidenced by  working with the CCM team  through collaboration with Consulting civil engineer, provider, and care team.   Interventions: 1:1 collaboration with primary care provider regarding development and update of comprehensive plan of care as evidenced by provider attestation and co-signature Inter-disciplinary care team collaboration (see longitudinal plan  of care) Evaluation of current treatment plan related to  self management and patient's adherence to plan as established by provider   SDOH Barriers (Status: Goal on Track (progressing): YES.) Long Term Goal  Patient interviewed and SDOH assessment performed        Patient interviewed and appropriate assessments performed Provided patient with information about resources in the county and care guides avaialble to assist with changes in SDOH or new needs as they arise Discussed plans with patient for ongoing care management follow up and provided patient with direct contact information for care management  team Advised patient to call the office for changes in SDOH, Question, or Concerns Provided education to patient/caregiver regarding level of care options. 04-23-2021: Review of resources provided to the patients daughter, [name] by the pharm D last week. She has looked at the information for help in the home but has not called any of the organizations yet. Review of Oncologist. Also review of [employer]. 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 06-11-2021: The patients daughter has a paid caregiver coming into the home 3 hours a day Monday through Friday to assist and she can tell a positive difference in the patient.     Urinary Incontinence with enlarged prostate and recent bladder stone  (Status: Goal Met. No Needs Identified this visit) Long Term Goal Closing this goal and will re-visit if new concerns 03-12-2021 Evaluation of current treatment plan related to  Urinary incontinence with enlarged prostate and recent bladder stone  , Level of care concerns and Memory Deficits self-management and patient's adherence to plan as established by provider. 11-13-2020: The patient recently had bladder stone removed on 10-20-2020. The patients daughter states that since removal of the bladder stone the patient denies any pain on urination and states he feels better. The daughter has taken the patient to follow ups with urologist. Review of sx and sx to monitor for changes in urinary health and well being. 01-15-2021: The patients daughter states that he is doing well since the procedure last year. He has not had anymore complaints or issues related to urinary retention. Will continue to monitor for changes.  Discussed plans with patient for ongoing care management follow up and provided patient with direct contact information for care management team Advised patient to call the office for changes in urinary health,  questions or concerns; Provided education to patient re: monitoring for sx and sx of UTI, increased pain on urinarion, new concerns related to urinary incontinence. 01-15-2021: Review and the patient or daughter denies any issues related to urinary retention or concerns ; Reviewed medications with patient and discussed compliance, the wife and daughter assist with medications management. 01-15-2021: The daughter states compliance with medications  ; Reviewed scheduled/upcoming provider appointments including Decemember 2022, with Urology specialist  The patien thas no appointment with the pcp coming up but education provided to the daughter to call the office for changes. 01-15-2021: Follows up with the provider as needed. Denies any new concerns with urinary retention; Discussed plans with patient for ongoing care management follow up and provided patient with direct contact information for care management team; Advised patient to discuss new development of urinary sx and sx with provider; Screening for signs and symptoms of depression related to chronic disease state;  Assessed social determinant of health barriers;   Hyperlipidemia:  (Status: Goal on Track (progressing): YES.) Lab Results  Component Value Date   CHOL 156 07/17/2020   HDL 37 (L) 07/17/2020   LDLCALC 105 (H) 07/17/2020   TRIG 71 07/17/2020   CHOLHDL 4.2 07/17/2020     Medication review performed; medication list updated in electronic medical record. 06-11-2021: Takes fish oil Provider established cholesterol goals reviewed; Counseled on importance of regular laboratory monitoring as prescribed. 06-11-2021: Review of the need to have regular lab work. Next month will be one year since labs. Education and support; Provided HLD Scientist, clinical (histocompatibility and immunogenetics); Reviewed role and benefits of statin for ASCVD risk reduction; Discussed strategies to manage statin-induced myalgias; Reviewed importance of limiting foods high in cholesterol. 01-15-2021:  The patients daughter states that the patient is not eating as well as he once was. She states partly because she things he does not want to prepare it or he forgets. His wife makes sure he eats when she is up but her daughter is working a lot when he is up and doing daily activities. Education and support given. 03-12-2021: The patient is eating well now. Does complain some of pain after eating. Discussed eating slow, sitting upright when eating and not laying down for 30 to one hour after eating to prevent issues with GERD. Education and support given. 04-23-2021: The patient has a good appetite. Per his daughter he has not had any further reports of his abdomen hurting after eating or at other times. Will continue to monitor. 06-11-2021: The daughter is happy with the caregiver coming in Monday through Friday for 3 hours a day. She makes sure he gets up, eats, and is active.  She also makes sure he takes his medications. This has been a positive change for the patient.   Hypertension: (Status: Goal on Track (progressing): YES.) Last practice recorded BP readings:  BP Readings from Last 3 Encounters:  02/26/21 120/78  01/19/21 (!) 166/84  12/26/20 (!) 152/82  Home visit on 03-20-2021 by [facility]: 130/90 with pulse of 86 Most recent eGFR/CrCl: No results found for: EGFR  No components found for: CRCL  Evaluation of current treatment plan related to hypertension self management and patient's adherence to plan as established by provider. 11-13-2020: The patient has long standing issues with his blood pressures fluctuating. The patient does take his blood pressures at home. The patients daughter and wife assist with his care. States compliance with medications. Will continue to monitor. 01-15-2021: The patient is still having fluctuations in his blood pressures. The patient saw Dr. Lucky Cowboy recently for venous insufficiency and will have further testing on 01-19-2021: The patients daughter states his right leg is more  swollen that the left. See new disease process under venous insufficiency.   03-12-2021: The patient saw the cardiologist recently and got a good check up. Does not have to see cardiology for one year. The patient did see vascular specialist a few months ago and it was recommended the patient get a lymphedema machine. The daughter is going to follow up on this as she has not heard back from the provider on this. Education and support given. 03-26-2021: The patient was evaluated by NP, Ralene Bathe in home on 03-20-2021. Blood pressure was 130/90 and pulse was 86. The patients weight was 208. No new findings noted. Will continue to monitor. 06-11-2021: The patient is having more stable blood pressure readings. No acute findings related to HTN or heart health.  Reviewed prescribed diet heart healthy diet. 06-11-2021: Review of heart healthy diet. Is eating better since caregiver is in the home 5  days a week.  Reviewed medications with patient and discussed importance of compliance. 06-11-2021: Review of medications compliance and the patients daughter verbalized compliance. No recent changes in medications.   Discussed plans with patient for ongoing care management follow up and provided patient with direct contact information for care management team; Advised patient, providing education and rationale, to monitor blood pressure daily and record, calling PCP for findings outside established parameters;  Advised patient to discuss blood pressure trends  with provider; Provided education on prescribed diet heart healthy diet ;  Discussed complications of poorly controlled blood pressure such as heart disease, stroke, circulatory complications, vision complications, kidney impairment, sexual dysfunction;    Venous Insufficiency   (Status: Goal on Track (progressing): YES.) Long Term Goal  Evaluation of current treatment plan related to  Venous Insufficiency ,  self-management and patient's adherence to plan as  established by provider. 03-12-2021: The patient is still having edema in his legs. The daughter states they have not heard back from the lymph edema machine and she will call and follow up on this. The patient does have pain and discomfort in his legs. Education and support given. 06-11-2021: I the patients venous insufficiency is stable. He is more active with the help of caregiver. Walks down the ramp at his home when she is there.  Discussed plans with patient for ongoing care management follow up and provided patient with direct contact information for care management team Advised patient to call the office for increased swelling, broken areas, or changes in temperature to the extremities; Provided education to patient re: venous insufficiency and sx and sx of decreased circulations, factors that the patient needs to alert the provider, and keeping appointments with the provider for further testing and evaluation; Reviewed medications with patient and discussed compliance. 06-11-2021: The patient is compliant with medications ; Provided patient with ways to decrease swelling  educational materials related to venous insufficiency and changes in circulation to extremities; Reviewed scheduled/upcoming provider appointments including has ultrasound on bilateral legs this week 01-19-2021 and will follow up with Dr. Lucky Cowboy after the ultrasound. Knows to call the office for changes in conditions, questions, or concerns. Saw cardiologist on 02-26-2021 and got a good report. Daughter will follow up with the vascular provider for updated information on lymph edema machine to help with swelling in the patients legs. 06-11-2021:: saw specialist recently. No changes in the plan of care. Knows to call for new needs ; Discussed plans with patient for ongoing care management follow up and provided patient with direct contact information for care management team; Screening for signs and symptoms of depression related to chronic  disease state;  Assessed social determinant of health barriers;      Lewy Body Dementia   (Status: Goal on Track (progressing): YES.) Long Term Goal  Evaluation of current treatment plan related to Dementia, Level of care concerns and Memory Deficits self-management and patient's adherence to plan as established by provider. 11-13-2020: The patient saw the neurologist recently and had changes in medications. A consult for speech therapy was made. The patients daughter states that she has not heard from them yet. Sometimes the patient has a hard time with his words and needs. Education and support given. Will continue to monitor. 01-15-2021: The patient had changes in medications and therefore it caused some nausea with vomiting. The patient saw the pcp in December and had an increase in his Omeprazole to 40 mg daily. Since the increase the patients daughter states he has  hand no further episodes of nausea with vomiting. The patient is stable as far as his memory and cognition. The daughter knows to call for changes, questions, or concerns. 03-12-2021: The patients daughter states this is stable. The patient has good days and bad days. Is not as mobile and sometimes has a better memory than other days. Does not go outside a lot. Denies any acute findings related to dementia and changes. 03-26-2021: The patient has good days and bad days. The daughter feels she is going to have to get help in the home sooner than later due to the patients mobility and decline. The patients wife told her daughter that sometimes he sits on the side of the bed for hours because he cannot get up due to his knees being painful. Empathetic listening and support given. 04-23-2021: The patients daughter is interested in help in the home Monday through Friday. She states that cost may be a factor. [facility] information provided and the daughter has written down information to call several placed. Support given. The patient  sometimes sleeps late and sometimes it takes him a while to get up. The patient has a lot of knee pain and also the cyst they will see the surgeon about tomorrow. Empathetic listening and support. The daughter wants to find help that will be beneficial for the patient and aide in his needs. 06-11-2021: Saw the neurologist recently and no changes in medications. The daughter states he is stable at this time. She says his mood is better since having Sharyn Lull, the paid caregiver come into the home. The patient daughter states this has been a positive change for him. Education and support given.  Discussed plans with patient for ongoing care management follow up and provided patient with direct contact information for care management team Advised patient to call the office for changes in memory, depression, anxiety, mood; Provided education to patient re: The 36 hour day resources for caregivers that have loved ones with dementia; Reviewed medications with patient and discussed compliance. The patient saw the neurologist recently and his rivastigmine was increased from the original 1.5 mg BID. 01-15-2021: The Rivastigmine is working well for the patient but he was having new onset of nausea with vomiting. The patients daughter states that since the Omeprazole was increased to 40 mg that the patient is doing better and no other concerns with N&V. 06-11-2021: Is compliant with medications. No current changes in medications. Will continue to monitor. Provided patient with resources to help with understanding Lewy body dementia educational materials related to progression of lewy body dementia; Reviewed scheduled/upcoming provider appointments including saw neurologist recently and keeps appointments as scheduled, knows to call the pcp office for new needs or concerns. 01-15-2021: The patients daughter knows to call the office for changes in conditions, questions, or concerns. The patient sees the specialist on a regular  basis. 03-26-2021: Discussed with the patients daughter today about the possibility of coming in and seeing the pcp for the ganglion cyst or seeing what the recommendations are for removal. 03-27-2021: Call made back to the patients daughter today after collaboration with the pcp. General surgery cannot do ganglion cyst and recommended orthopedics. New referral sent to [facility] for referral for assessment and treatment options for ganglion cyst., The daughter verbalized understanding. 04-23-2021: The daughter states that they have an appointment to see the surgeon tomorrow about the ganglion cyst that he has on his arm. She states her and her mother have discussed and do  not know if it would be the best for him to have it removed. Education on keeping the appointment and seeing what the surgeon recommends. 06-11-2021: Saw specialist recently. Keeps appointments. Denies any new concerns at this time. Knows to call for changes.  Discussed plans with patient for ongoing care management follow up and provided patient with direct contact information for care management team; Advised patient to discuss progression and changes with provider; Screening for signs and symptoms of depression related to chronic disease state;  Assessed social determinant of health barriers;     GERD  (Status: Goal on Track (progressing): YES.) Long Term Goal  Evaluation of current treatment plan related to GERD,  self-management and patient's adherence to plan as established by provider. 06-11-2021: Denies any issues with GERD at this time. The patient is eating. Denies any concerns with dietary restrictions.  Discussed plans with patient for ongoing care management follow up and provided patient with direct contact information for care management team Advised patient to sit upright when eating, do not lay down 30 minutes to an hour after eating, take medications as directed, avoid spicy foods to prevent exacerbation of GERD. The daughter  states the patient has been complaining of his stomach hurting after eating; Provided education to patient re: GERD exacerbations and factors that cause exacerbation, changes to monitor for, precautions and safety; Reviewed medications with patient and discussed compliance. The patients daughter states the patient is compliant with medications.; Discussed plans with patient for ongoing care management follow up and provided patient with direct contact information for care management team; Advised patient to discuss changes in eating habits, changes in sx and sx associated with GERD, Questions and concerns with provider; Screening for signs and symptoms of depression related to chronic disease state;  Assessed social determinant of health barriers;     Growth on Left arm: Ganglion Cyst (Status: Goal Met.) Short Term Goal  Clsoing this goal. No new concerns related to ganglion cyst.  Evaluation of current treatment plan related to  Growth on left arm ,  self-management and patient's adherence to plan as established by provider. 03-26-2021: The patient was seen by NP, Ralene Bathe on 03-20-2021 and she reported back to the [facility] and pcp that the area is a 5 cm area "ganglion cyst". Likely from where the patient had one removed several years ago. The patient states it is painful when it is touched and sometimes wakes him from his sleep. The patient would like it drained or removed. A call was made to the patients daughter after she left a Voice mail asking for return call after the visit from the NP. RNCM  read the notes to the patients daughter and she feels like it needs to be removed. The RNCM reached out to the pcp by secure chat to get recommendations. The pcp recommends since they want the area to be excised that a consult be made to [place], as the pcp could only drain it in the office. RNCM called the patients daughter back and she agrees to the consult with [facility]. Pcp will place the order for [facility] for further evaluation and treatment of the  ganglion cyst to the patients left arm. 04-23-2021: Sees the surgeon tomorrow for evaluation and recommendations. The daughter and the patients wife are unsure if the patient should have surgery to have the cyst removed. Encouraged her to keep the appointment and discuss concerns with the surgeon. Discussed plans with patient for  ongoing care management follow up and provided patient with direct contact information for care management team Advised patient to call the office for changes in area, questions and concerns about the growth; Provided education to patient re: left arm growth, review of sx and sx presented, if pain is present, previous interventions from several years ago; Collaborated with pcp regarding recommendations for evaluation and treatment options to the left arm-growth. In basket message sent to the pcp asking for recommendations for left arm growth. The patients daughter states the area is the size of an orange and getting bigger the patient says it hurts sometimes. The patient had surgical intervention before but it has been several years. The patients daughter is asking for pcp recommendations. Will collaborate with the pcp and get back with the patients daughter. Call made back to the daughter after collaboration with the pcp and gave options. [facility] NP Ralene Bathe will see the patient next week and can collaborate back with the pcp and get a referral. The daughter agrees with this plan and will follow up accordingly. Will send a message to NP Ralene Bathe with [facility] and let pcp know the desires of the patient and patients daughter. 03-26-2021: Have discussed assessment by the NP and discussed with pcp next steps after speaking to the patients daughter. The pcp will place a referral for [facility] to have the area assessed and offer treatment options.  03-27-2021: After collaboration and information received from the pcp, the RNCM called the patients daughter back and let her know that she would be hearing from [place] as general surgery does not do ganglion cyst. The daughter verbalized understanding.  ; Discussed plans with patient for ongoing care management follow up and provided patient with direct contact information for care management team; Message information from Dr. Parks Ranger: I have sent new referral to [facility] ([facility])  Address: [address] [place], [place] [address]  Hours:  9AM-5PM  Phone: [phone]  Information communicated to [facility] today via telephone. The daughter verbalized understanding of instructions provided and that [facility] would be calling for an appointment.    Falls:  (Status: Goal on Track (progressing): YES.) Long Term Goal  Provided written and verbal education re: potential causes of falls and Fall prevention strategies Reviewed medications and discussed potential side effects of medications such as dizziness and frequent urination Advised patient of importance of notifying provider of falls. 06-11-2021: Per [name] the patient has fallen x2 since the last outreach. Education on notifying the provider of new falls.  Assessed for signs and symptoms of orthostatic hypotension Assessed for falls since last encounter. 06-11-2021: The patient has had 2 falls since last outreach in April. The daughter does not believe he hit his head. He is acting his usual self. Education and support given.  Assessed patients knowledge of fall risk prevention secondary to previously provided education Provided patient information for fall alert systems Assessed working status of life alert bracelet and patient adherence Advised patient to discuss discuss fall and safety prevention  with provider Screening for signs and symptoms of depression related to chronic disease  state Assessed social determinant of health barriers   Patient Goals/Self-Care Activities: Patient will self administer medications as prescribed as evidenced by self report/primary caregiver report  Patient will attend all scheduled provider appointments as evidenced by clinician review of documented attendance to scheduled appointments and patient/caregiver report Patient will call pharmacy for medication refills as evidenced  by patient report and review of pharmacy fill history as appropriate Patient will attend church or other social activities as evidenced by patient report Patient will continue to perform ADL's independently as evidenced by patient/caregiver report Patient will call provider office for new concerns or questions as evidenced by review of documented incoming telephone call notes and patient report Patient will work with BSW to address care coordination needs and will continue to work with the clinical team to address health care and disease management related needs as evidenced by documented adherence to scheduled care management/care coordination appointments - check blood pressure 3 times per week - choose a place to take my blood pressure (home, clinic or office, retail store) - write blood pressure results in a log or diary - learn about high blood pressure - keep a blood pressure log - take blood pressure log to all doctor appointments - call doctor for signs and symptoms of high blood pressure - develop an action plan for high blood pressure - keep all doctor appointments - take medications for blood pressure exactly as prescribed - report new symptoms to your doctor - eat more whole grains, fruits and vegetables, lean meats and healthy fats - call for medicine refill 2 or 3 days before it runs out - take all medications exactly as prescribed - call doctor with any symptoms you believe are related to your medicine - call doctor when you experience any new  symptoms - go to all doctor appointments as scheduled - adhere to prescribed diet: Heart Healthy Diet       Plan:Telephone follow up appointment with care management team member scheduled for:  08-13-2021 at 0900 am  Noreene Larsson RN, MSN, [place] Mobile: [phone]

## 2021-06-12 ENCOUNTER — Ambulatory Visit: Payer: Medicare Other | Admitting: Licensed Clinical Social Worker

## 2021-06-12 DIAGNOSIS — M159 Polyosteoarthritis, unspecified: Secondary | ICD-10-CM

## 2021-06-12 DIAGNOSIS — F0280 Dementia in other diseases classified elsewhere without behavioral disturbance: Secondary | ICD-10-CM

## 2021-06-12 DIAGNOSIS — I10 Essential (primary) hypertension: Secondary | ICD-10-CM

## 2021-06-17 NOTE — Patient Instructions (Signed)
Visit Information  Thank you for taking time to visit with me today. Please don't hesitate to contact me if I can be of assistance to you before our next scheduled telephone appointment.  Following are the goals we discussed today:  Patient Self Care Activities:  Attend all scheduled provider appointments Call provider office for new concerns or questions Continue utilizing healthy coping strategies to manage stressors  Our next appointment is by telephone on 09/04/21 at 10 AM  Please call the care guide team at [phone] if you need to cancel or reschedule your appointment.   If you are experiencing a Mental Health or [place] or need someone to talk to, please call 911   Patient verbalizes understanding of instructions and care plan provided today and agrees to view in [place]. Active MyChart status and patient understanding of how to access instructions and care plan via MyChart confirmed with patient.     Christa See, MSW, [place] [facility] Care Management [email or web address] Phone [phone] 7:55 AM

## 2021-06-17 NOTE — Chronic Care Management (AMB) (Signed)
Chronic Care Management    Clinical Social Work Note  06/17/2021 Name: Randy [name] MRN: [id number] DOB: 27-Oct-1934  Randy [name] is a 87 y.o. year old male who is a primary care patient of [name], DO. The CCM team was consulted to assist the patient with chronic disease management and/or care coordination needs related to: Level of Care Concerns and Caregiver Stress.   Engaged with patient's daughter by telephone for follow up visit in response to provider referral for social work chronic care management and care coordination services.   Consent to Services:  The patient was given information about Chronic Care Management services, agreed to services, and gave verbal consent prior to initiation of services.  Please see initial visit note for detailed documentation.   Patient agreed to services and consent obtained.   Assessment: Review of patient past medical history, allergies, medications, and health status, including review of relevant consultants reports was performed today as part of a comprehensive evaluation and provision of chronic care management and care coordination services.     SDOH (Social Determinants of Health) assessments and interventions performed:    Advanced Directives Status: Not addressed in this encounter.  CCM Care Plan  Allergies  Allergen Reactions   Antihistamines, Chlorpheniramine-Type Other (See Comments)    Can't urinate    Outpatient Encounter Medications as of 06/12/2021  Medication Sig   acetaminophen (TYLENOL) 500 MG tablet Take 1,000 mg by mouth every 6 (six) hours as needed for moderate pain.   carbidopa-levodopa (SINEMET CR) 50-200 MG tablet Take 1 tablet by mouth 4 (four) times daily.   cholecalciferol (VITAMIN D) 1000 units tablet Take 1,000 Units by mouth daily.   Coenzyme Q10 (CO Q 10) 100 MG CAPS Take 100 mg by mouth daily.    diclofenac Sodium (VOLTAREN) 1 % GEL APPLY 2 G TOPICALLY 4 (FOUR) TIMES DAILY AS NEEDED. TO KNEE  ARTHRITIS PAIN   enalapril (VASOTEC) 5 MG tablet Take 1 tablet (5 mg total) by mouth daily.   fluticasone (FLONASE) 50 MCG/ACT nasal spray Place 2 sprays into both nostrils as needed for allergies.   furosemide (LASIX) 20 MG tablet Take 1 tablet (20 mg total) by mouth every other day.   Multiple Vitamin (MULTIVITAMIN WITH MINERALS) TABS tablet Take 1 tablet by mouth daily.   Omega-3 Fatty Acids (FISH OIL) 1200 MG CAPS Take 1,200 mg by mouth in the morning, at noon, in the evening, and at bedtime.   omeprazole (PRILOSEC) 20 MG capsule Take 40 mg by mouth daily.   rivastigmine (EXELON) 3 MG capsule Take 3 mg by mouth 2 (two) times daily.   sertraline (ZOLOFT) 50 MG tablet Take 50 mg by mouth at bedtime.   traMADol (ULTRAM) 50 MG tablet Take 1 tablet (50 mg total) by mouth every 8 (eight) hours as needed.   vitamin C (ASCORBIC ACID) 500 MG tablet Take 500 mg by mouth daily.   zinc sulfate 220 (50 Zn) MG capsule Take 1 capsule (220 mg total) by mouth daily.   No facility-administered encounter medications on file as of 06/12/2021.    Patient Active Problem List   Diagnosis Date Noted   Swelling of limb 12/26/2020   Bilateral primary osteoarthritis of knee 02/15/2020   Pressure injury of buttock, stage 1    Delirium    COVID-19 virus infection 10/08/2019   Unable to care for self 10/08/2019   Caregiver unable to cope 10/08/2019   Recurrent falls 10/08/2019   Lewy body dementia  without behavioral disturbance (HCC)    Prostate cancer ([place]) 06/30/2019   BPH with urinary obstruction 11/06/2018   Acute cholecystitis 09/08/2018   Myalgia due to statin 02/24/2018   Tricompartment osteoarthritis of left knee 11/14/2017   Generalized weakness 04/28/2017   Chronic low back pain 03/12/2017   Knee pain, chronic 10/01/2016   Chronic venous insufficiency 08/30/2016   BPH without obstruction/lower urinary tract symptoms 03/06/2016   Osteoarthritis of multiple joints 03/06/2016   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   Essential hypertension 03/05/2016   Hyperlipidemia 03/05/2016   Osteoporosis 03/05/2016   GERD (gastroesophageal reflux disease) 03/05/2016   Bilateral lower extremity edema 03/05/2016   Dupuytren's disease of palm 05/28/2015    Conditions to be addressed/monitored: HTN, Dementia, and Osteoarthritis; Level of care concerns  Care Plan : General Social Work (Adult)  Updates made by Rebekah Chesterfield, LCSW since 06/17/2021 12:00 AM     Problem: Caregiver Stress      Long-Range Goal: Caregiver Coping Optimized for Daughter   Start Date: 04/11/2020  Expected End Date: 09/06/2021  This Visit's Progress: On track  Recent Progress: On track  Priority: Medium  Note:   Current Barriers:  Financial constraints Level of care concerns ADL IADL limitations Social Isolation Limited access to caregiver Memory Deficits Lacks knowledge of community resource: personal care service resources within the area that may benefit patient Lack of socialization  Clinical Social Work Clinical Goal(s):  Over the next 120 days, client will work with SW to address concerns related to lack of care/support within the home Interventions: Patient's daughter, [name], provided all information during visit CCM LCSW provided the contact number for [facility]. [name] is interested in monetary resources to assist with in-home aid. She agreed to f/up with [facility] regarding the wait-list for supportive services Palliative Care continues to provide services to patient  Patient utilizes walker and/or rollator to promote safety while ambulating in and out of the home. Patient continues to take Tylenol to assist with temporary pain management CCM LCSW acknowledged caregiver stress Discussed plans with patient for ongoing care management follow up and provided patient with direct contact information for care management team Advised patient to contact CCM team with any urgent  concerns 1:1 collaboration with primary care provider regarding development and update of comprehensive plan of care as evidenced by provider attestation and co-signature Patient Self Care Activities:  Attend all scheduled provider appointments Call provider office for new concerns or questions Continue utilizing healthy coping strategies to manage stressors       Follow Up Plan: SW will follow up with patient by phone over the next 6-8 weeks      Christa See, MSW, [email or web address] Phone [phone] 7:54 AM

## 2021-07-04 ENCOUNTER — Ambulatory Visit: Payer: Self-pay | Admitting: Urology

## 2021-07-06 ENCOUNTER — Other Ambulatory Visit: Payer: Self-pay

## 2021-07-06 DIAGNOSIS — I10 Essential (primary) hypertension: Secondary | ICD-10-CM | POA: Diagnosis not present

## 2021-07-06 DIAGNOSIS — R600 Localized edema: Secondary | ICD-10-CM

## 2021-07-06 DIAGNOSIS — G3183 Dementia with Lewy bodies: Secondary | ICD-10-CM | POA: Diagnosis not present

## 2021-07-06 DIAGNOSIS — E785 Hyperlipidemia, unspecified: Secondary | ICD-10-CM | POA: Diagnosis not present

## 2021-07-06 MED ORDER — FUROSEMIDE 20 MG PO TABS: 20.0000 mg | ORAL_TABLET | ORAL | 0 refills | Status: DC

## 2021-07-09 ENCOUNTER — Ambulatory Visit: Payer: Medicare Other | Admitting: Urology

## 2021-07-09 ENCOUNTER — Encounter: Payer: Self-pay | Admitting: Urology

## 2021-07-09 VITALS — BP 155/83 | HR 72 | Ht 72.0 in | Wt 208.0 lb

## 2021-07-09 DIAGNOSIS — Z87440 Personal history of urinary (tract) infections: Secondary | ICD-10-CM

## 2021-07-09 DIAGNOSIS — Z8546 Personal history of malignant neoplasm of prostate: Secondary | ICD-10-CM | POA: Diagnosis not present

## 2021-07-09 DIAGNOSIS — C61 Malignant neoplasm of prostate: Secondary | ICD-10-CM

## 2021-07-09 DIAGNOSIS — N401 Enlarged prostate with lower urinary tract symptoms: Secondary | ICD-10-CM

## 2021-07-09 DIAGNOSIS — N138 Other obstructive and reflux uropathy: Secondary | ICD-10-CM | POA: Diagnosis not present

## 2021-07-09 LAB — BLADDER SCAN AMB NON-IMAGING

## 2021-07-09 NOTE — Progress Notes (Signed)
   07/09/2021 10:18 AM   Randy [name] Better 12/24/1934 [id number]  Reason for visit: Follow up history of urinary retention, recurrent UTIs, dysuria, prostate cancer  HPI: 87 year old male with Lewy body dementia who presented to me in fall 2020 with Foley dependent urinary retention, and ultimately underwent a HOLEP with removal of 33 g of tissue showing <5% involvement with prostate adenocarcinoma Gleason score 4+3=7.  PSA preop was 2.7, and postop was 0.5, then 0.6 most recently in June 2022.  He had significant bleeding at the time of surgery, and only the right lateral lobe and median lobes were removed.  He had been doing well with low PVRs and had resumed spontaneous voiding, however developed an Enterococcus UTI in April 2022 followed by an additional UTI a few months later.  He also was having some urinary symptoms of dysuria despite appropriate antibiotics.  I recommended a cystoscopy, and in October 2022 he was found to have a 3 cm bladder stone and some residual obstructive appearing left lateral lobe tissue.  He underwent uncomplicated cystolitholapaxy and HOLEP of the remaining left lateral lobe on 10/20/2020 with removal of 23 g of benign tissue.  He is doing very well since surgery and denies any incontinence, dysuria, or other urinary symptoms, and is voiding with a strong stream.  PVR is normal at 54 mL.  We discussed the need to continue to follow the PSA with his history of intermediate risk prostate cancer at time of original HOLEP in 2020, however would be very unlikely that he would require any intervention for this in the future based on his age and comorbidities.  PSA today, call with results RTC 1 year PSA and PVR   [name], MD  [facility] [address] [place], [place] [address] [phone]

## 2021-07-09 NOTE — Addendum Note (Signed)
Addended by: Donalee Citrin on: 07/09/2021 10:24 AM   Modules accepted: Orders

## 2021-07-10 LAB — PSA: Prostate Specific Ag, Serum: 0.1 ng/mL (ref 0.0–4.0)

## 2021-07-13 ENCOUNTER — Ambulatory Visit (INDEPENDENT_AMBULATORY_CARE_PROVIDER_SITE_OTHER): Payer: Medicare Other | Admitting: Pharmacist

## 2021-07-13 DIAGNOSIS — I10 Essential (primary) hypertension: Secondary | ICD-10-CM

## 2021-07-13 DIAGNOSIS — R413 Other amnesia: Secondary | ICD-10-CM

## 2021-07-13 NOTE — Patient Instructions (Signed)
Visit Information  Thank you for taking time to visit with me today. Please don't hesitate to contact me if I can be of assistance to you before our next scheduled telephone appointment.  Following are the goals we discussed today:   Goals Addressed             This Visit's Progress    Pharmacy Goals       Please continue to monitor home blood pressure and keep log of results and have this record to review during our next appointment.   Feel free to call me with any questions or concerns. I look forward to our next call!  Wallace Cullens, PharmD, [place] [phone]         Our next appointment is by telephone on 11/8 at 8:30 am  Please call the care guide team at [phone] if you need to cancel or reschedule your appointment.    Patient verbalizes understanding of instructions and care plan provided today and agrees to view in [place]. Active MyChart status and patient understanding of how to access instructions and care plan via MyChart confirmed with patient.

## 2021-07-13 NOTE — Chronic Care Management (AMB) (Signed)
Chronic Care Management CCM Pharmacy Note  07/13/2021 Name:  Randy [name] MRN:  [id number] DOB:  12-06-1934   Subjective: Randy [name] is an 87 y.o. year old male who is a primary patient of Randy Hauser, DO.  The CCM team was consulted for assistance with disease management and care coordination needs.    Engaged with patient by telephone for follow up visit for pharmacy case management and/or care coordination services.   Objective:  Medications Reviewed Today     Reviewed by [name], RPH-CPP (Pharmacist) on 07/13/21 at [facility] List Status: <None>   Medication Order Taking? Sig Documenting Provider Last Dose Status Informant  acetaminophen (TYLENOL) 500 MG tablet [id number] Yes Take 1,000 mg by mouth every 6 (six) hours as needed for moderate pain. [provider] Taking Active Family Member  carbidopa-levodopa (SINEMET CR) 50-200 MG tablet [id number] Yes Take 1 tablet by mouth 4 (four) times daily. [provider] Taking Active Family Member  cholecalciferol (VITAMIN D) 1000 units tablet [id number] Yes Take 1,000 Units by mouth daily. [provider] Taking Active Family Member  Coenzyme Q10 (CO Q 10) 100 MG CAPS [id number] Yes Take 100 mg by mouth daily.  [provider] Taking Active Family Member  diclofenac Sodium (VOLTAREN) 1 % GEL [id number] Yes APPLY 2 G TOPICALLY 4 (FOUR) TIMES DAILY AS NEEDED. TO KNEE ARTHRITIS PAIN Randy Hauser, DO Taking Active Family Member  enalapril (VASOTEC) 5 MG tablet [id number] Yes Take 1 tablet (5 mg total) by mouth daily. Randy Hauser, DO Taking Active   fluticasone (FLONASE) 50 MCG/ACT nasal spray [id number] Yes Place 2 sprays into both nostrils as needed for allergies. [provider] Taking Active Family Member           Med Note [name], [name]   Mon Oct 26, 2018  1:07 PM)    furosemide (LASIX) 20 MG tablet [id number] Yes Take 1 tablet (20 mg total) by mouth  every other day. Kate Sable, MD Taking Active   melatonin 3 MG TABS tablet [id number] Yes Take 3 mg by mouth at bedtime. [provider] Taking Active   Multiple Vitamin (MULTIVITAMIN WITH MINERALS) TABS tablet [id number]  Take 1 tablet by mouth daily. [name], MD  Active Family Member  Omega-3 Fatty Acids (FISH OIL) 1200 MG CAPS [id number]  Take 1,200 mg by mouth in the morning, at noon, in the evening, and at bedtime. [provider]  Active Family Member  omeprazole (PRILOSEC) 20 MG capsule [id number]  Take 40 mg by mouth daily. [provider]  Active Family Member  rivastigmine (EXELON) 3 MG capsule [id number] Yes Take 3 mg by mouth 2 (two) times daily. [provider] Taking Active   sertraline (ZOLOFT) 50 MG tablet [id number]  Take 50 mg by mouth at bedtime. [provider]  Active Family Member  traMADol (ULTRAM) 50 MG tablet [id number]  Take 1 tablet (50 mg total) by mouth every 8 (eight) hours as needed. Randy Hauser, DO  Active Family Member  vitamin C (ASCORBIC ACID) 500 MG tablet [id number]  Take 500 mg by mouth daily. [provider]  Active Family Member  zinc sulfate 220 (50 Zn) MG capsule [id number]  Take 1 capsule (220 mg total) by mouth daily. [name], MD  Active Family Member            Pertinent Labs:  Lab Results  Component Value Date   HGBA1C 5.4 06/10/2018  Lab Results  Component Value Date   CREATININE 0.90 10/17/2020   BUN 21 10/17/2020   NA 141 10/17/2020   K 3.8 10/17/2020   CL 104 10/17/2020   CO2 30 10/17/2020    SDOH:  (Social Determinants of Health) assessments and interventions performed:    [place]  Review of patient past medical history, allergies, medications, health status, including review of consultants reports, laboratory and other test data, was performed as part of comprehensive evaluation and provision of chronic care management services.   Care  Plan : PharmD - Medication Managment  Updates made by [name], RPH-CPP since 07/13/2021 12:00 AM     Problem: Disease Progression      Long-Range Goal: Disease Progression Prevented or Minimized   Start Date: 01/19/2020  Expected End Date: 04/18/2020  Recent Progress: On track  Priority: High  Note:   Current Barriers:  Chronic Disease Management support, education, and care coordination needs related to hypertension, osteoarthritis, hyperlipidemia, insomnia and memory loss Cognitive Deficits  Pharmacist Clinical Goal(s):  Over the next 90 days, caregiver/patient will adhere to plan to optimize therapeutic regimen for hypertension as evidenced by report of adherence to recommended medication management changes through collaboration with PharmD and provider.   Interventions: 1:1 collaboration with Randy Hauser, DO regarding development and update of comprehensive plan of care as evidenced by provider attestation and co-signature Inter-disciplinary care team collaboration (see longitudinal plan of care) Perform chart review Patient seen for Office Visit with [facility] Neurology on 5/3. Provider advised patient: Rivastigmine: take 3 mg two times a day with meals Continue Sinemet CR 50/200 - 1 tablet by mouth by mouth four times a day Continue/refill Sertraline 50 mg (for mood) Okay to take Melatonin 3 mg at night for REM behavior disorder Referral placed to [facility] Gastroenterology consult for unintentional weight loss  Referral placed to [place] for unintentional weight loss Office Visit with [place] on 7/3 Today caregiver reports started giving patient melatonin as directed by Neurology and feels it is providing some benefit Reports spoke with [facility] over the phone, rather than in person appointment States now has a caregiver aide coming in during the day to help patient get up, administer morning/early  afternoon medication, make meals and do exercises Reports patient's weight has been stable over the past months now that caregiver is in place (making meals for patient each morning) Holding off on making appointment  Provide caregiver with appointment in case needed in the future Reports continues to use caution with use of tramadol. Has patient lie down if takes  Hypertension Patient followed by [facility] Reports patient taking: Enalapril 5 mg daily in the morning Furosemide 20 mg on Mondays, Wednesdays and Fridays Reports denies monitoring home BP recently Reports swelling in legs improved as caregiver is putting compression stockings on patient each morning and patient tries to remember to elevate legs throughout the day Encourage caregiver to monitor home BP, keep log, bring this record to medical appointments and contact office for readings outside of established parameters or symptoms    Medication Adherence Note patient using weekly pillbox with slots for four times daily dosing Reports caregiver aide now assisting with remembering four times/day dosing Have discussed with caregiver additional strategies for using medication alarm as adherence aid  Reports that she and her mom manage patient's as needed medications  Patient Goals/Self-Care Activities Over the next 90 days, caregiver/patient will:  - take medications as prescribed -  check blood pressure, document, and provide at future appointments  Follow Up Plan: Telephone follow up appointment with care management team member scheduled for: 11/8 at 8:30 am      Wallace Cullens, PharmD, Para [place], [facility] [place] [phone]

## 2021-07-20 ENCOUNTER — Ambulatory Visit (INDEPENDENT_AMBULATORY_CARE_PROVIDER_SITE_OTHER): Payer: Medicare Other | Admitting: Nurse Practitioner

## 2021-07-20 ENCOUNTER — Encounter (INDEPENDENT_AMBULATORY_CARE_PROVIDER_SITE_OTHER): Payer: Self-pay | Admitting: Nurse Practitioner

## 2021-07-20 VITALS — BP 149/90 | HR 76 | Resp 16

## 2021-07-20 DIAGNOSIS — I890 Lymphedema, not elsewhere classified: Secondary | ICD-10-CM

## 2021-07-20 DIAGNOSIS — E782 Mixed hyperlipidemia: Secondary | ICD-10-CM | POA: Diagnosis not present

## 2021-07-20 DIAGNOSIS — I10 Essential (primary) hypertension: Secondary | ICD-10-CM

## 2021-07-24 ENCOUNTER — Other Ambulatory Visit: Payer: Medicare Other | Admitting: Primary Care

## 2021-07-24 DIAGNOSIS — F0280 Dementia in other diseases classified elsewhere without behavioral disturbance: Secondary | ICD-10-CM

## 2021-07-24 DIAGNOSIS — M7989 Other specified soft tissue disorders: Secondary | ICD-10-CM

## 2021-07-24 DIAGNOSIS — Z515 Encounter for palliative care: Secondary | ICD-10-CM

## 2021-07-24 NOTE — Progress Notes (Signed)
[place] Consult Note Telephone: [phone]  Fax: [phone]    Date of encounter: 07/24/21 4:19 PM PATIENT NAME: Randy [name] [address] Randy [name] [place] [address]   [phone] (home)  DOB: 1934/07/11 MRN: [id number] PRIMARY CARE PROVIDER:    [name], DO,  [place] [address] [phone]  REFERRING PROVIDER:   [name], DO [address] [place],  [place] [address] [phone]  RESPONSIBLE PARTY:    Contact Information     Name Relation Home Work Mobile   Randy [name] Daughter [phone]  [phone]   Randy [name] [phone]         I met face to face with patient and family in  home. Palliative Care was asked to follow this patient by consultation request of  Randy [name] * to address advance care planning and complex medical decision making. This is a follow up visit.                                   ASSESSMENT AND PLAN / RECOMMENDATIONS:   Advance Care Planning/Goals of Care: Goals include to maximize quality of life and symptom management. No acp, pt does not have capacity and poa not home.  Symptom Management/Plan:   Nutrition: Endorses good appetite, no recent weight but reports 208 lbs. Subjectively appears stable.   Mobility: Has bathroom adaptation. Has rollator. Denies falls.   Arm swelling: alleged cyst, same site as former ganglion cyst.Wife states pt could not tolerate CT. Still reports tenderness. Has not pursued  more intervention. I cannot locate surgery consult note or attempt at imaging. He does report it is still uncomfortable and pain interrupts his sleep at hs. May still need f/u or referral.  Medication review: Denies problems with medications. Daughter assists with set up.   Follow up Palliative Care Visit: Palliative care will continue to follow for complex medical decision making, advance care planning, and clarification of goals.  Return 12 weeks or prn.  I spent 40 minutes providing this consultation. More than 50% of the time in this consultation was spent in counseling and care coordination.  PPS: 40%  HOSPICE ELIGIBILITY/DIAGNOSIS:  no  Chief Complaint: dementia, pain in arm  HISTORY OF PRESENT ILLNESS:  Randy [name] is a 87 y.o. year old male  with Lewy body dementia, immobility, debility, Swelling in L upper arm . Patient seen today to review palliative care needs to include medical decision making and advance care planning as appropriate.   History obtained from review of EMR, discussion with primary team, and interview with family, facility staff/caregiver and/or Randy [name].  I reviewed available labs, medications, imaging, studies and related documents from the EMR.  Records reviewed and summarized above.   ROS  General: NAD ENMT: denies dysphagia Cardiovascular: denies chest pain, denies DOE Pulmonary: denies cough, denies increased SOB Abdomen: endorses good appetite, denies constipation, endorses continence of bowel GU: denies dysuria, endorses continence of urine MSK:  denies  increased weakness,  no falls reported Skin: denies rashes or wounds, probable cyst upper L arm Neurological: endorses L upper arm pain, denies insomnia Psych: Endorses positive mood  Physical Exam: Current and past weights: 208 lbs.  Constitutional: NAD, checks bp regularly, normotensive. General: frail appearing EYES: anicteric sclera, lids intact, no discharge  ENMT: intact hearing, oral mucous membranes moist, dentition intact CV: 2+ bil  LE edema Pulmonary:no  increased work of breathing, no cough, room air Abdomen: intake 80%,  no ascites MSK: +sarcopenia, moves all extremities, ambulatory with walker  Skin: warm and dry, no rashes or wounds on visible skin Neuro:  + generalized weakness,  ++ cognitive impairment, non-anxious affect   Thank you for the opportunity to participate in the care of Randy [name].  The  palliative care team will continue to follow. Please call our office at [phone] if we can be of additional assistance.   Jason Coop, NP DNP, AGPCNP-BC  COVID-19 PATIENT SCREENING TOOL Asked and negative response unless otherwise noted:   Have you had symptoms of covid, tested positive or been in contact with someone with symptoms/positive test in the past 5-10 days?

## 2021-07-25 ENCOUNTER — Telehealth: Payer: Self-pay

## 2021-07-25 NOTE — Telephone Encounter (Signed)
1120 am.  Phone call made to daughter [name] to follow up on NP visit from yesterday and address ganglion cyst.  Per [name], and ultrasound was completed by did not show the provider what he needed to see so CT scan was ordered.  Patient took a valium prior to CT scan but was not able to tolerate laying down for a long period of time due to hip pain.  At this time, no further interventions are ordered as CT scan needs to be done per daughter.  Update provided to Ralene Bathe, NP.

## 2021-07-27 ENCOUNTER — Emergency Department
Admission: EM | Admit: 2021-07-27 | Discharge: 2021-07-27 | Discharge status: Against Medical Advice | Payer: Medicare Other | Source: Home / Self Care

## 2021-07-27 ENCOUNTER — Other Ambulatory Visit: Payer: Self-pay

## 2021-07-27 ENCOUNTER — Ambulatory Visit: Payer: Self-pay

## 2021-07-27 ENCOUNTER — Emergency Department: Payer: Medicare Other

## 2021-07-27 DIAGNOSIS — W01198A Fall on same level from slipping, tripping and stumbling with subsequent striking against other object, initial encounter: Secondary | ICD-10-CM | POA: Insufficient documentation

## 2021-07-27 DIAGNOSIS — Z5321 Procedure and treatment not carried out due to patient leaving prior to being seen by health care provider: Secondary | ICD-10-CM | POA: Insufficient documentation

## 2021-07-27 DIAGNOSIS — R4182 Altered mental status, unspecified: Secondary | ICD-10-CM | POA: Insufficient documentation

## 2021-07-27 DIAGNOSIS — Y92238 Other place in hospital as the place of occurrence of the external cause: Secondary | ICD-10-CM | POA: Insufficient documentation

## 2021-07-27 DIAGNOSIS — S0993XA Unspecified injury of face, initial encounter: Secondary | ICD-10-CM | POA: Insufficient documentation

## 2021-07-27 DIAGNOSIS — M1612 Unilateral primary osteoarthritis, left hip: Secondary | ICD-10-CM | POA: Diagnosis not present

## 2021-07-27 DIAGNOSIS — R262 Difficulty in walking, not elsewhere classified: Secondary | ICD-10-CM | POA: Diagnosis not present

## 2021-07-27 LAB — COMPREHENSIVE METABOLIC PANEL
ALT: 9 U/L (ref 0–44)
AST: 46 U/L — ABNORMAL HIGH (ref 15–41)
Albumin: 3.7 g/dL (ref 3.5–5.0)
Alkaline Phosphatase: 101 U/L (ref 38–126)
Anion gap: 7 (ref 5–15)
BUN: 23 mg/dL (ref 8–23)
CO2: 29 mmol/L (ref 22–32)
Calcium: 8.8 mg/dL — ABNORMAL LOW (ref 8.9–10.3)
Chloride: 102 mmol/L (ref 98–111)
Creatinine, Ser: 0.76 mg/dL (ref 0.61–1.24)
GFR, Estimated: >60 mL/min (ref >60)
Glucose, Bld: 100 mg/dL — ABNORMAL HIGH (ref 70–99)
Potassium: 3.8 mmol/L (ref 3.5–5.1)
Sodium: 138 mmol/L (ref 135–145)
Total Bilirubin: 0.9 mg/dL (ref 0.3–1.2)
Total Protein: 7.2 g/dL (ref 6.5–8.1)

## 2021-07-27 LAB — CBC
HCT: 39.2 % (ref 39.0–52.0)
Hemoglobin: 12.5 g/dL — ABNORMAL LOW (ref 13.0–17.0)
MCH: 30.3 pg (ref 26.0–34.0)
MCHC: 31.9 g/dL (ref 30.0–36.0)
MCV: 94.9 fL (ref 80.0–100.0)
Platelets: 190 10*3/uL (ref 150–400)
RBC: 4.13 MIL/uL — ABNORMAL LOW (ref 4.22–5.81)
RDW: 13.4 % (ref 11.5–15.5)
WBC: 5.8 10*3/uL (ref 4.0–10.5)
nRBC: 0 % (ref 0.0–0.2)

## 2021-07-27 NOTE — Telephone Encounter (Signed)
  Chief Complaint: unable to understand what pt is saying "it like he is speaking a foreign language", he has to be prompted and must be assisted with all ADL's. Daughter [name] stated that "he's forgotten how to do anything." Daughter stated pt fell earlier in the week. Symptoms: see above Frequency: decline 1-2 days ago.  Pertinent Negatives: Patient denies daughter not with pt Disposition: '[x]'$ ED /'[]'$ Urgent Care (no appt availability in office) / '[]'$ Appointment(In office/virtual)/ '[]'$  [facility]/ '[]'$ Home Care/ '[]'$ Refused Recommended Disposition /'[]'$ [place] [facility]/ '[]'$  Follow-up with PCP Additional Notes: Daughter advised to call 911. Even with Lewy body dementia, he was able to to understand and his language was comprehensible.   Daughter stated he feel earlier in the week. Reason for Disposition  [1] Loss of speech or garbled speech AND [2] sudden onset AND [3] present now  Answer Assessment - Initial Assessment Questions 1. SYMPTOM: "What is the main symptom you are concerned about?" (e.g., weakness, numbness)   Like talking a different language- forgotten how to do anything 2. ONSET: "When did this start?" (minutes, hours, days; while sleeping)     Last night 3. LAST NORMAL: "When was the last time you (the patient) were normal (no symptoms)?"    He was at his baseline until 2 days ago 4. PATTERN "Does this come and go, or has it been constant since it started?"  "Is it present now?"     No- yes  5. CARDIAC SYMPTOMS: "Have you had any of the following symptoms: chest pain, difficulty breathing, palpitations?"     N/a- not with pt. 6. NEUROLOGIC SYMPTOMS: "Have you had any of the following symptoms: headache, dizziness, vision loss, double vision, changes in speech, unsteady on your feet?"     Lewy body dementia, suddenly does not know how to take care of himself (going to bed , common ADL's) 7. OTHER SYMPTOMS: "Do you have any other symptoms?"   H/o fall  Protocols used:  Neurologic Deficit-A-AH

## 2021-07-27 NOTE — ED Triage Notes (Deleted)
Pt presents to ED with mom with c/o of having a fall while at pediatrician's office. Pt did hit his nose and mom states pt did not lose LOC and did cry right away. No deformity noted. Some redness noted with minor swelling. Minor abrasion to lip noted, bleeding controlled. Pt acting his age at this time. Parents deny vomiting at this time.

## 2021-07-27 NOTE — ED Triage Notes (Signed)
Pt presents to ED via [facility] with c/o of "not being himself". Daughter states pt seemed to be speaking jibberish and states it started last night. Pt does have HX of lewy body dementia. Pt has equal strengths and grips and BLE are also equal as well.

## 2021-07-27 NOTE — ED Triage Notes (Signed)
Pt comes into the ED via EMS from home with c/o care giver concerned about a stroke, states he woke up and was unable to walk, concerned it could be UTI .   97.5 149/87 HR83 95%RA

## 2021-07-27 NOTE — ED Notes (Signed)
Pt's family member informed this Probation officer that they are leaving.  This writer advised for pt and family member to stay without success.  Pt and family advised on risks of leaving prior to being seen by EDP.

## 2021-07-28 ENCOUNTER — Inpatient Hospital Stay: Payer: Medicare Other

## 2021-07-28 ENCOUNTER — Inpatient Hospital Stay
Admission: EM | Admit: 2021-07-28 | Discharge: 2021-07-31 | DRG: 554 | Disposition: A | Discharge status: Skilled Nursing Facility | Payer: Medicare Other | Attending: Internal Medicine | Admitting: Internal Medicine

## 2021-07-28 ENCOUNTER — Encounter: Payer: Self-pay | Admitting: Intensive Care

## 2021-07-28 ENCOUNTER — Other Ambulatory Visit: Payer: Self-pay

## 2021-07-28 ENCOUNTER — Emergency Department: Payer: Medicare Other

## 2021-07-28 DIAGNOSIS — G20 Parkinson's disease: Secondary | ICD-10-CM | POA: Diagnosis present

## 2021-07-28 DIAGNOSIS — Z9181 History of falling: Secondary | ICD-10-CM | POA: Diagnosis not present

## 2021-07-28 DIAGNOSIS — Z9049 Acquired absence of other specified parts of digestive tract: Secondary | ICD-10-CM | POA: Diagnosis not present

## 2021-07-28 DIAGNOSIS — Z8546 Personal history of malignant neoplasm of prostate: Secondary | ICD-10-CM | POA: Diagnosis not present

## 2021-07-28 DIAGNOSIS — Z888 Allergy status to other drugs, medicaments and biological substances status: Secondary | ICD-10-CM

## 2021-07-28 DIAGNOSIS — M1612 Unilateral primary osteoarthritis, left hip: Principal | ICD-10-CM | POA: Diagnosis present

## 2021-07-28 DIAGNOSIS — Z79899 Other long term (current) drug therapy: Secondary | ICD-10-CM

## 2021-07-28 DIAGNOSIS — G4730 Sleep apnea, unspecified: Secondary | ICD-10-CM | POA: Diagnosis present

## 2021-07-28 DIAGNOSIS — R262 Difficulty in walking, not elsewhere classified: Secondary | ICD-10-CM | POA: Diagnosis present

## 2021-07-28 DIAGNOSIS — Z87891 Personal history of nicotine dependence: Secondary | ICD-10-CM

## 2021-07-28 DIAGNOSIS — D649 Anemia, unspecified: Secondary | ICD-10-CM | POA: Diagnosis present

## 2021-07-28 DIAGNOSIS — K219 Gastro-esophageal reflux disease without esophagitis: Secondary | ICD-10-CM | POA: Diagnosis present

## 2021-07-28 DIAGNOSIS — M159 Polyosteoarthritis, unspecified: Secondary | ICD-10-CM | POA: Diagnosis present

## 2021-07-28 DIAGNOSIS — E785 Hyperlipidemia, unspecified: Secondary | ICD-10-CM | POA: Diagnosis present

## 2021-07-28 DIAGNOSIS — M810 Age-related osteoporosis without current pathological fracture: Secondary | ICD-10-CM | POA: Diagnosis present

## 2021-07-28 DIAGNOSIS — W19XXXA Unspecified fall, initial encounter: Secondary | ICD-10-CM | POA: Diagnosis present

## 2021-07-28 DIAGNOSIS — Z748 Other problems related to care provider dependency: Secondary | ICD-10-CM | POA: Diagnosis present

## 2021-07-28 DIAGNOSIS — F0280 Dementia in other diseases classified elsewhere without behavioral disturbance: Secondary | ICD-10-CM | POA: Diagnosis present

## 2021-07-28 DIAGNOSIS — I160 Hypertensive urgency: Secondary | ICD-10-CM | POA: Diagnosis present

## 2021-07-28 DIAGNOSIS — R4182 Altered mental status, unspecified: Secondary | ICD-10-CM | POA: Diagnosis not present

## 2021-07-28 DIAGNOSIS — Z79891 Long term (current) use of opiate analgesic: Secondary | ICD-10-CM | POA: Diagnosis not present

## 2021-07-28 DIAGNOSIS — Z789 Other specified health status: Secondary | ICD-10-CM | POA: Diagnosis present

## 2021-07-28 DIAGNOSIS — G3183 Dementia with Lewy bodies: Secondary | ICD-10-CM | POA: Diagnosis present

## 2021-07-28 LAB — COMPREHENSIVE METABOLIC PANEL
ALT: 14 U/L (ref 0–44)
AST: 24 U/L (ref 15–41)
Albumin: 3.6 g/dL (ref 3.5–5.0)
Alkaline Phosphatase: 90 U/L (ref 38–126)
Anion gap: 7 (ref 5–15)
BUN: 28 mg/dL — ABNORMAL HIGH (ref 8–23)
CO2: 29 mmol/L (ref 22–32)
Calcium: 8.6 mg/dL — ABNORMAL LOW (ref 8.9–10.3)
Chloride: 102 mmol/L (ref 98–111)
Creatinine, Ser: 0.72 mg/dL (ref 0.61–1.24)
GFR, Estimated: >60 mL/min (ref >60)
Glucose, Bld: 98 mg/dL (ref 70–99)
Potassium: 3.9 mmol/L (ref 3.5–5.1)
Sodium: 138 mmol/L (ref 135–145)
Total Bilirubin: 1.3 mg/dL — ABNORMAL HIGH (ref 0.3–1.2)
Total Protein: 6.8 g/dL (ref 6.5–8.1)

## 2021-07-28 LAB — CBC WITH DIFFERENTIAL/PLATELET
Abs Immature Granulocytes: 0.02 10*3/uL (ref 0.00–0.07)
Basophils Absolute: 0 10*3/uL (ref 0.0–0.1)
Basophils Relative: 1 %
Eosinophils Absolute: 0.1 10*3/uL (ref 0.0–0.5)
Eosinophils Relative: 2 %
HCT: 37.8 % — ABNORMAL LOW (ref 39.0–52.0)
Hemoglobin: 12.2 g/dL — ABNORMAL LOW (ref 13.0–17.0)
Immature Granulocytes: 0 %
Lymphocytes Relative: 21 %
Lymphs Abs: 1.4 10*3/uL (ref 0.7–4.0)
MCH: 30.4 pg (ref 26.0–34.0)
MCHC: 32.3 g/dL (ref 30.0–36.0)
MCV: 94.3 fL (ref 80.0–100.0)
Monocytes Absolute: 0.7 10*3/uL (ref 0.1–1.0)
Monocytes Relative: 11 %
Neutro Abs: 4.3 10*3/uL (ref 1.7–7.7)
Neutrophils Relative %: 65 %
Platelets: 185 10*3/uL (ref 150–400)
RBC: 4.01 MIL/uL — ABNORMAL LOW (ref 4.22–5.81)
RDW: 13.3 % (ref 11.5–15.5)
WBC: 6.6 10*3/uL (ref 4.0–10.5)
nRBC: 0 % (ref 0.0–0.2)

## 2021-07-28 LAB — CBC
HCT: 35.3 % — ABNORMAL LOW (ref 39.0–52.0)
Hemoglobin: 11.6 g/dL — ABNORMAL LOW (ref 13.0–17.0)
MCH: 30.5 pg (ref 26.0–34.0)
MCHC: 32.9 g/dL (ref 30.0–36.0)
MCV: 92.9 fL (ref 80.0–100.0)
Platelets: 175 10*3/uL (ref 150–400)
RBC: 3.8 MIL/uL — ABNORMAL LOW (ref 4.22–5.81)
RDW: 13.4 % (ref 11.5–15.5)
WBC: 6.1 10*3/uL (ref 4.0–10.5)
nRBC: 0 % (ref 0.0–0.2)

## 2021-07-28 LAB — URINALYSIS, ROUTINE W REFLEX MICROSCOPIC
Bilirubin Urine: NEGATIVE
Glucose, UA: NEGATIVE mg/dL
Hgb urine dipstick: NEGATIVE
Ketones, ur: 5 mg/dL — AB
Leukocytes,Ua: NEGATIVE
Nitrite: NEGATIVE
Protein, ur: NEGATIVE mg/dL
Specific Gravity, Urine: 1.019 (ref 1.005–1.030)
pH: 6 (ref 5.0–8.0)

## 2021-07-28 LAB — CREATININE, SERUM
Creatinine, Ser: 0.72 mg/dL (ref 0.61–1.24)
GFR, Estimated: >60 mL/min (ref >60)

## 2021-07-28 LAB — TROPONIN I (HIGH SENSITIVITY)
Troponin I (High Sensitivity): 9 ng/L (ref <18)
Troponin I (High Sensitivity): 9 ng/L (ref <18)

## 2021-07-28 MED ORDER — MELATONIN 5 MG PO TABS
5.0000 mg | ORAL_TABLET | Freq: Every day | ORAL | Status: DC
Start: 2021-07-28 — End: 2021-07-31
  Administered 2021-07-29 – 2021-07-30 (×3): 5 mg via ORAL
  Filled 2021-07-28 (×3): qty 1

## 2021-07-28 MED ORDER — ENALAPRIL MALEATE 5 MG PO TABS
5.0000 mg | ORAL_TABLET | Freq: Every day | ORAL | Status: DC
  Administered 2021-07-29: 5 mg via ORAL
  Filled 2021-07-28 (×2): qty 1

## 2021-07-28 MED ORDER — ENOXAPARIN SODIUM 40 MG/0.4ML IJ SOSY
40.0000 mg | PREFILLED_SYRINGE | INTRAMUSCULAR | Status: DC
Start: 2021-07-28 — End: 2021-07-31
  Administered 2021-07-29 – 2021-07-30 (×2): 40 mg via SUBCUTANEOUS
  Filled 2021-07-28 (×3): qty 0.4

## 2021-07-28 MED ORDER — ACETAMINOPHEN 325 MG PO TABS
650.0000 mg | ORAL_TABLET | ORAL | Status: DC | PRN
  Administered 2021-07-30: 650 mg via ORAL
  Filled 2021-07-28: qty 2

## 2021-07-28 MED ORDER — RIVASTIGMINE TARTRATE 3 MG PO CAPS
3.0000 mg | ORAL_CAPSULE | Freq: Two times a day (BID) | ORAL | Status: DC
  Administered 2021-07-29 – 2021-07-31 (×5): 3 mg via ORAL
  Filled 2021-07-28 (×8): qty 1

## 2021-07-28 MED ORDER — SODIUM CHLORIDE 0.9 % IV SOLN
INTRAVENOUS | Status: DC
Start: 2021-07-28 — End: 2021-07-29

## 2021-07-28 MED ORDER — SERTRALINE HCL 50 MG PO TABS
50.0000 mg | ORAL_TABLET | Freq: Every day | ORAL | Status: DC
Start: 2021-07-29 — End: 2021-07-31
  Administered 2021-07-29 – 2021-07-30 (×2): 50 mg via ORAL
  Filled 2021-07-28 (×2): qty 1

## 2021-07-28 MED ORDER — ACETAMINOPHEN 650 MG RE SUPP: 650.0000 mg | RECTAL | Status: DC | PRN

## 2021-07-28 MED ORDER — FUROSEMIDE 20 MG PO TABS
20.0000 mg | ORAL_TABLET | ORAL | Status: DC
  Administered 2021-07-29 – 2021-07-31 (×2): 20 mg via ORAL
  Filled 2021-07-28 (×2): qty 1

## 2021-07-28 MED ORDER — LORAZEPAM 0.5 MG PO TABS
0.5000 mg | ORAL_TABLET | ORAL | Status: DC | PRN
  Administered 2021-07-28 – 2021-07-29 (×3): 0.5 mg via ORAL
  Filled 2021-07-28 (×3): qty 1

## 2021-07-28 MED ORDER — STROKE: EARLY STAGES OF RECOVERY BOOK: Freq: Once | Status: AC

## 2021-07-28 MED ORDER — ACETAMINOPHEN 325 MG PO TABS
650.0000 mg | ORAL_TABLET | Freq: Once | ORAL | Status: AC
  Administered 2021-07-28: 650 mg via ORAL
  Filled 2021-07-28: qty 2

## 2021-07-28 MED ORDER — ACETAMINOPHEN 160 MG/5ML PO SOLN: 650.0000 mg | ORAL | Status: DC | PRN

## 2021-07-28 MED ORDER — CARBIDOPA-LEVODOPA ER 50-200 MG PO TBCR
1.0000 | EXTENDED_RELEASE_TABLET | Freq: Four times a day (QID) | ORAL | Status: DC
  Administered 2021-07-29 – 2021-07-31 (×10): 1 via ORAL
  Filled 2021-07-28 (×13): qty 1

## 2021-07-28 NOTE — Assessment & Plan Note (Signed)
Continue acetaminophen and tramadol

## 2021-07-28 NOTE — ED Provider Triage Note (Signed)
Emergency Medicine Provider Triage Evaluation Note  Randy [name] , a 87 y.o. male  was evaluated in triage.  Pt complains of bilateral hip pain. Fell 4 days ago and has less mobile.  Last night had a near fall in which he was caught and eased down to the ground.  Patient now complains of his right and his left hip.  Caregiver denies any head injury or loss of consciousness last night.  Review of Systems  Positive: Hip pain. Negative:   Physical Exam  BP 137/80 (BP Location: Right Arm)   Pulse 84   Temp 98.1 F (36.7 C) (Oral)   Resp 16   Ht 6' (1.829 m)   Wt 99.8 kg   SpO2 94%   BMI 29.84 kg/m  Gen:   Awake, no distress   Resp:  Normal effort  MSK:   Moves upper extremities. Decrease movement lower extremities.  Moderate tenderness to palpation only to both right and left hips. Other:    Medical Decision Making  Medically screening exam initiated at 1:15 PM.  Appropriate orders placed.  Randy [name] was informed that the remainder of the evaluation will be completed by another provider, this initial triage assessment does not replace that evaluation, and the importance of remaining in the ED until their evaluation is complete.     Johnn Hai, PA-C 07/28/21 1345

## 2021-07-28 NOTE — Assessment & Plan Note (Addendum)
Family reported altered mental status from baseline since fall on 7/18 Possibility of stroke.  Could be progression of dementia, parkinsonism, side effects of pain meds CT head done on 7/21 shows no acute intracranial injury Follow-up MRI,  If MRI positive for stroke will get stroke work-up with carotid Doppler and echo Telemetry monitoring for arrhythmias Swallow eval, neurologic checks Fall precautions

## 2021-07-28 NOTE — Assessment & Plan Note (Signed)
Followed annually by urology and was last seen a couple months ago on chart review

## 2021-07-28 NOTE — Assessment & Plan Note (Addendum)
Caregiver unable to cope PT and TOC evaluation-recommending rehab Awaiting placement

## 2021-07-28 NOTE — ED Triage Notes (Signed)
Patient arrives with daughter from home who he lives with. Fell Tuesday 07/24/21 night. Denies hitting head but unsure why he fell. Reports decline in status starting Tuesday c/o decline in AMS, reports speech issues Thursday that haven't resolved fully, and decrease in mobility. Hx dementia with parkinsons.

## 2021-07-28 NOTE — H&P (Incomplete)
History and Physical    Patient: Randy [name] HYQ:[id number] DOB: 12-13-1934 DOA: 07/28/2021 DOS: the patient was seen and examined on 07/28/2021 PCP: [name], DO  Patient coming from: Home  Chief Complaint:  Chief Complaint  Patient presents with  . Altered Mental Status    HPI: Randy [name] is a 87 y.o. male with medical history significant for Lewy body dementia, parkinsonism followed by neurology, osteoarthritis, prostate cancer, essential hypertension, brought in by family who reports a decline in mobility since a fall on 7/18.  Patient did not hit his head and had no loss of consciousness and did not appear to suffer any apparent injury but seems to have difficulty ambulating.  He appears to have pain in his left hip and has not been participating with his self-care as of his baseline.  Since the fall he also appears to have worsening of his baseline mentation and slurred speech.  Patient presented to the ED the day prior, and was evaluated with a head CT that showed no acute intracranial findings.  They left without being seen due to prolonged ED wait times but returned today due to continued symptoms. ED course and data review: Afebrile, pulse 70, respirations 18, BP 171/93 with O2 sat of 100% on room air. Labs: CBC normal except for hemoglobin of 12.2.  CMP for the most part unremarkable, troponin 9.  Urinalysis sterile. X-ray left hip showing severe degenerative changes but no recent fracture or dislocation CT hip confirms no acute fracture or dislocation, severe left hip osteoarthritis and chronic healed fracture of the left superior and inferior pubic rami.  MRI brain ordered.  Hospitalist consulted for admission.    Past Medical History:  Diagnosis Date  . Arthritis   . GERD (gastroesophageal reflux disease)   . Hyperlipidemia   . Hypertension   . Lewy body dementia ([place])   . Osteoporosis   . Prostate enlargement    Past Surgical History:  Procedure  Laterality Date  . CHOLECYSTECTOMY N/A 09/10/2018   Procedure: LAPAROSCOPIC CHOLECYSTECTOMY;  Surgeon: Benjamine Sprague, DO;  Location: [facility];  Service: General;  Laterality: N/A;  . CYSTOSCOPY WITH LITHOLAPAXY N/A 10/20/2020   Procedure: CYSTOSCOPY WITH LITHOLAPAXY;  Surgeon: [name], MD;  Location: [facility];  Service: Urology;  Laterality: N/A;  . GANGLION CYST EXCISION Left 2013   Left upper arm  . HERNIA REPAIR N/A 2008   [place], ventral acquired hernia repair  . HOLEP-LASER ENUCLEATION OF THE PROSTATE WITH MORCELLATION N/A 11/06/2018   Procedure: HOLEP-LASER ENUCLEATION OF THE PROSTATE WITH MORCELLATION;  Surgeon: [name], MD;  Location: [facility];  Service: Urology;  Laterality: N/A;  . HOLEP-LASER ENUCLEATION OF THE PROSTATE WITH MORCELLATION N/A 10/20/2020   Procedure: HOLEP-LASER ENUCLEATION OF THE PROSTATE WITH MORCELLATION;  Surgeon: [name], MD;  Location: [facility];  Service: Urology;  Laterality: N/A;   Social History:  reports that he quit smoking about 55 years ago. His smoking use included cigarettes. He has a 12.00 pack-year smoking history. He has never used smokeless tobacco. He reports that he does not drink alcohol and does not use drugs.  Allergies  Allergen Reactions  . Antihistamines, Chlorpheniramine-Type Other (See Comments)    Can't urinate    Family History  Problem Relation Age of Onset  . Breast cancer Mother   . Lung cancer Father   . Heart disease Brother   . Leukemia Brother   . Prostate cancer Neg Hx   . Colon  cancer Neg Hx   . Bladder Cancer Neg Hx   . Kidney cancer Neg Hx     Prior to Admission medications   Medication Sig Start Date End Date Taking? Authorizing Provider  acetaminophen (TYLENOL) 500 MG tablet Take 1,000 mg by mouth every 6 (six) hours as needed for moderate pain.    [provider]  carbidopa-levodopa (SINEMET CR) 50-200 MG tablet Take 1 tablet by mouth 4 (four) times daily. 06/22/20   [provider]  cholecalciferol (VITAMIN D) 1000 units tablet Take 1,000 Units by mouth daily.    [provider]  Coenzyme Q10 (CO Q 10) 100 MG CAPS Take 100 mg by mouth daily.     [provider]  diclofenac Sodium (VOLTAREN) 1 % GEL APPLY 2 G TOPICALLY 4 (FOUR) TIMES DAILY AS NEEDED. TO KNEE ARTHRITIS PAIN 09/28/20   [name], DO  enalapril (VASOTEC) 5 MG tablet Take 1 tablet (5 mg total) by mouth daily. 12/05/20   Karamalegos, Devonne Doughty, DO  fluticasone (FLONASE) 50 MCG/ACT nasal spray Place 2 sprays into both nostrils as needed for allergies.    [provider]  furosemide (LASIX) 20 MG tablet Take 1 tablet (20 mg total) by mouth every other day. 07/06/21   Kate Sable, MD  melatonin 3 MG TABS tablet Take 3 mg by mouth at bedtime.    [provider]  Multiple Vitamin (MULTIVITAMIN WITH MINERALS) TABS tablet Take 1 tablet by mouth daily. 10/12/19   [name], MD  Omega-3 Fatty Acids (FISH OIL) 1200 MG CAPS Take 1,200 mg by mouth in the morning, at noon, in the evening, and at bedtime.    [provider]  omeprazole (PRILOSEC) 20 MG capsule Take 40 mg by mouth daily.    [provider]  rivastigmine (EXELON) 3 MG capsule Take 3 mg by mouth 2 (two) times daily.    [provider]  sertraline (ZOLOFT) 50 MG tablet Take 50 mg by mouth at bedtime. 06/22/20   [provider]  traMADol (ULTRAM) 50 MG tablet Take 1 tablet (50 mg total) by mouth every 8 (eight) hours as needed. 05/05/20   Karamalegos, Devonne Doughty, DO  vitamin C (ASCORBIC ACID) 500 MG tablet Take 500 mg by mouth daily.    [provider]  zinc sulfate 220 (50 Zn) MG capsule Take 1 capsule (220 mg total) by mouth daily. 10/12/19   [name], MD    Physical Exam: Vitals:   07/28/21 1313 07/28/21 1705 07/28/21 1714 07/28/21 1915  BP:  (!) 144/86 (!) 144/86 (!) 171/93  Pulse:   86 70  Resp:  '20 20 18  '$ Temp:   98.5 F (36.9  C) 98.5 F (36.9 C)  TempSrc:   Oral Oral  SpO2:  96% 100% 100%  Weight: 99.8 kg     Height: 6' (1.829 m)      Physical Exam  Labs on Admission: I have personally reviewed following labs and imaging studies  CBC: Recent Labs  Lab 07/27/21 1505 07/28/21 1859  WBC 5.8 6.6  NEUTROABS  --  4.3  HGB 12.5* 12.2*  HCT 39.2 37.8*  MCV 94.9 94.3  PLT 190 101   Basic Metabolic Panel: Recent Labs  Lab 07/27/21 1505 07/28/21 1859  NA 138 138  K 3.8 3.9  CL 102 102  CO2 29 29  GLUCOSE 100* 98  BUN 23 28*  CREATININE 0.76 0.72  CALCIUM 8.8* 8.6*   GFR: Estimated Creatinine  Clearance: 82.6 mL/min (by C-G formula based on SCr of 0.72 mg/dL). Liver Function Tests: Recent Labs  Lab 07/27/21 1505 07/28/21 1859  AST 46* 24  ALT 9 14  ALKPHOS 101 90  BILITOT 0.9 1.3*  PROT 7.2 6.8  ALBUMIN 3.7 3.6   No results for input(s): "LIPASE", "AMYLASE" in the last 168 hours. No results for input(s): "AMMONIA" in the last 168 hours. Coagulation Profile: No results for input(s): "INR", "PROTIME" in the last 168 hours. Cardiac Enzymes: No results for input(s): "CKTOTAL", "CKMB", "CKMBINDEX", "TROPONINI" in the last 168 hours. BNP (last 3 results) No results for input(s): "PROBNP" in the last 8760 hours. HbA1C: No results for input(s): "HGBA1C" in the last 72 hours. CBG: No results for input(s): "GLUCAP" in the last 168 hours. Lipid Profile: No results for input(s): "CHOL", "HDL", "LDLCALC", "TRIG", "CHOLHDL", "LDLDIRECT" in the last 72 hours. Thyroid Function Tests: No results for input(s): "TSH", "T4TOTAL", "FREET4", "T3FREE", "THYROIDAB" in the last 72 hours. Anemia Panel: No results for input(s): "VITAMINB12", "FOLATE", "FERRITIN", "TIBC", "IRON", "RETICCTPCT" in the last 72 hours. Urine analysis:    Component Value Date/Time   COLORURINE YELLOW (A) 07/28/2021 1459   APPEARANCEUR CLEAR (A) 07/28/2021 1459   APPEARANCEUR Cloudy (A) 08/17/2020 1024   LABSPEC 1.019  07/28/2021 1459   PHURINE 6.0 07/28/2021 1459   GLUCOSEU NEGATIVE 07/28/2021 1459   HGBUR NEGATIVE 07/28/2021 1459   BILIRUBINUR NEGATIVE 07/28/2021 1459   BILIRUBINUR Negative 08/17/2020 1024   BILIRUBINUR negative 04/17/2015 0826   KETONESUR 5 (A) 07/28/2021 1459   PROTEINUR NEGATIVE 07/28/2021 1459   UROBILINOGEN 0.2 08/09/2020 1319   UROBILINOGEN Normal 04/17/2015 0826   NITRITE NEGATIVE 07/28/2021 1459   LEUKOCYTESUR NEGATIVE 07/28/2021 1459   LEUKOCYTESUR negative 04/17/2015 0826    Radiological Exams on Admission: CT Hip Left Wo Contrast  Result Date: 07/28/2021 CLINICAL DATA:  Hip pain, stress fracture suspected. Negative x-ray. Patient fell on Tuesday 07/24/2021 night. EXAM: CT OF THE LEFT HIP WITHOUT CONTRAST TECHNIQUE: Multidetector CT imaging of the left hip was performed according to the standard protocol. Multiplanar CT image reconstructions were also generated. RADIATION DOSE REDUCTION: This exam was performed according to the departmental dose-optimization program which includes automated exposure control, adjustment of the mA and/or kV according to patient size and/or use of iterative reconstruction technique. COMPARISON:  None Available. FINDINGS: Bones/Joint/Cartilage No evidence of acute fracture or dislocation. Chronic healed fracture of the left superior and inferior pubic rami. Advanced left hip osteoarthritis with bone-on-bone articulation and subchondral cystic changes and prominent marginal osteophytes. No appreciable joint effusion. Mild degenerative changes of the left sacroiliac joint and pubic symphysis. Ligaments Suboptimally assessed by CT. Muscles and Tendons No acute abnormality. No intramuscular hematoma or fluid collection. Soft tissues Skin and subcutaneous soft tissues are unremarkable. IMPRESSION: 1.  No acute fracture or dislocation. 2. Severe left hip osteoarthritis. Chronic healed fracture of the left superior and inferior pubic rami. 3. Muscles and  subcutaneous soft tissues are unremarkable without evidence of acute abnormality. Electronically Signed   By: Keane Police D.O.   On: 07/28/2021 18:56   DG HIP UNILAT WITH PELVIS 2-3 VIEWS LEFT  Result Date: 07/28/2021 CLINICAL DATA:  Recent fall, pain EXAM: DG HIP (WITH OR WITHOUT PELVIS) 2-3V LEFT COMPARISON:  None Available. FINDINGS: No recent fracture or dislocation is seen. Deformity in the left superior pubis ramus was also evident in previous CT done on 09/08/2018. Severe degenerative changes are noted in left hip joint with joint space, subcortical cysts narrowing  and bony spurs. IMPRESSION: No recent fracture or dislocation is seen in left hip. Severe degenerative changes are noted in the left hip. Deformity in the superior ramus of left pubic bone may be residual from previous injury. Electronically Signed   By: Elmer Picker M.D.   On: 07/28/2021 13:53   CT HEAD WO CONTRAST (5MM)  Result Date: 07/27/2021 CLINICAL DATA:  Altered mental status EXAM: CT HEAD WITHOUT CONTRAST TECHNIQUE: Contiguous axial images were obtained from the base of the skull through the vertex without intravenous contrast. RADIATION DOSE REDUCTION: This exam was performed according to the departmental dose-optimization program which includes automated exposure control, adjustment of the mA and/or kV according to patient size and/or use of iterative reconstruction technique. COMPARISON:  10/12/2019 FINDINGS: Brain: No acute intracranial findings are seen. There are no signs of bleeding within the cranium. Cortical sulci are prominent. There is decreased density in periventricular white matter suggesting small-vessel disease. Vascular: Unremarkable. Skull: There is small bony defect in the posterior wall of right frontal sinus on the left side. Similar finding was seen on the previous study. Sinuses/Orbits: There is partial opacification of left side of frontal sinus. There are no air-fluid levels in the paranasal  sinuses. Other: None. IMPRESSION: No acute intracranial findings are seen in noncontrast CT brain. Atrophy. Small-vessel disease. There is defect in the posterior wall of left frontal sinus, possibly related to infectious process or previous intervention in the left frontal sinus. There is no evidence of any edema or mass effect in the adjacent brain. Electronically Signed   By: Elmer Picker M.D.   On: 07/27/2021 17:07     Data Reviewed: Relevant notes from primary care and specialist visits, past discharge summaries as available in EHR, including Care Everywhere. Prior diagnostic testing as pertinent to current admission diagnoses Updated medications and problem lists for reconciliation ED course, including vitals, labs, imaging, treatment and response to treatment Triage notes, nursing and pharmacy notes and ED provider's notes Notable results as noted in HPI   Assessment and Plan: * Ambulatory dysfunction Left hip pain secondary to recent fall, history of falls Severe degenerative joint disease Suspect multifactorial and related to pain from recent fall, severe DJD, Lower suspicion for acute CVA Continue Parkinson meds Pain control Fall precautions PT eval  Altered mental status Family reported altered mental status from baseline since fall on 7/18 Possibility of stroke.  Could be progression of dementia, parkinsonism, side effects of pain meds CT head done on 7/21 shows no acute intracranial injury Follow-up MRI,  If MRI positive for stroke will get stroke work-up with carotid Doppler and echo Telemetry monitoring for arrhythmias Swallow eval, neurologic checks Fall precautions    Unable to care for self Caregiver unable to cope PT and TOC evaluation Patient might need placement  Hypertensive urgency BP elevated to 171/93 Continue enalapril.  Currently on Lasix for unclear reason but will continue  Lewy body dementia without behavioral disturbance  (HCC) Parkinsonism Last seen by neurology in May 2023 Continue carbidopa-levodopa, rivastigmine and Zoloft Delirium precautions - Minimize/avoid deliriogenic meds including: anticholinergic, opiates, benzodiazepines           - Maintain hydration, oxygenation, nutrition           - Limit use of restraints and catheters           - Normalize sleep patterns by minimizing nighttime noise, light and interruptions by                -Ensure  sleep apnea treatment is provided overnight.             clustering care, opening blinds during the day           - Reorient the patient frequently, provide easily visible clock and calendar           - Provide sensory aids like glasses, hearing aids           - Encourage ambulation, regular activities and visitors to maintain cognitive stimulation              -Patient would benefit from having family members at bedside to reinforce his orientation.   History of prostate cancer Followed annually by urology and was last seen a couple months ago on chart review  Osteoarthritis of multiple joints Continue acetaminophen and tramadol    DVT prophylaxis: Lovenox  Consults: none  Advance Care Planning:   Code Status: Prior   Family Communication: none  Disposition Plan: Back to previous home environment  Severity of Illness: The appropriate patient status for this patient is INPATIENT. Inpatient status is judged to be reasonable and necessary in order to provide the required intensity of service to ensure the patient's safety. The patient's presenting symptoms, physical exam findings, and initial radiographic and laboratory data in the context of their chronic comorbidities is felt to place them at high risk for further clinical deterioration. Furthermore, it is not anticipated that the patient will be medically stable for discharge from the hospital within 2 midnights of admission.   * I certify that at the point of admission it is my clinical judgment  that the patient will require inpatient hospital care spanning beyond 2 midnights from the point of admission due to high intensity of service, high risk for further deterioration and high frequency of surveillance required.*  Author: Athena Masse, MD 07/28/2021 10:20 PM  For on call review [email or web address].

## 2021-07-28 NOTE — Assessment & Plan Note (Addendum)
Parkinsonism Last seen by neurology in May 2023 Continue carbidopa-levodopa, rivastigmine and Zoloft Delirium precautions - Minimize/avoid deliriogenic meds including: anticholinergic, opiates, benzodiazepines           - Maintain hydration, oxygenation, nutrition           - Limit use of restraints and catheters           - Normalize sleep patterns by minimizing nighttime noise, light and interruptions by                -Ensure sleep apnea treatment is provided overnight.             clustering care, opening blinds during the day           - Reorient the patient frequently, provide easily visible clock and calendar           - Provide sensory aids like glasses, hearing aids           - Encourage ambulation, regular activities and visitors to maintain cognitive stimulation              -Patient would benefit from having family members at bedside to reinforce his orientation. Haldol prn agitation

## 2021-07-28 NOTE — Assessment & Plan Note (Addendum)
Left hip pain secondary to recent fall, history of falls Severe degenerative joint disease Suspect multifactorial and related to pain from recent fall, severe DJD, Lower suspicion for acute CVA, MRI brain was negative.  CT hip was negative for any acute fractures. Continue Parkinson meds Pain control Fall precautions PT eval-recommending rehab. Medically stable for discharge

## 2021-07-28 NOTE — H&P (Signed)
History and Physical    Patient: Randy [name] FAO:[id number] DOB: 18-May-1934 DOA: 07/28/2021 DOS: the patient was seen and examined on 07/28/2021 PCP: [name], DO  Patient coming from: Home  Chief Complaint:  Chief Complaint  Patient presents with   Altered Mental Status    HPI: Randy [name] is a 86 y.o. male with medical history significant for Lewy body dementia, parkinsonism followed by neurology, osteoarthritis, prostate cancer, essential hypertension, brought in by family who reports a decline in mobility since a fall on 7/18.  Patient did not hit his head and had no loss of consciousness and did not appear to suffer any apparent injury but seems to have difficulty ambulating.  He appears to have pain in his left hip and has not been participating with his self-care as of his baseline.  Since the fall he also appears to have worsening of his baseline mentation and slurred speech.  Patient presented to the ED the day prior, and was evaluated with a head CT that showed no acute intracranial findings.  They left without being seen due to prolonged ED wait times but returned today due to continued symptoms. ED course and data review: Afebrile, pulse 70, respirations 18, BP 171/93 with O2 sat of 100% on room air. Labs: CBC normal except for hemoglobin of 12.2.  CMP for the most part unremarkable, troponin 9.  Urinalysis sterile. X-ray left hip showing severe degenerative changes but no recent fracture or dislocation CT hip confirms no acute fracture or dislocation, severe left hip osteoarthritis and chronic healed fracture of the left superior and inferior pubic rami.  MRI brain ordered.  Hospitalist consulted for admission.    Past Medical History:  Diagnosis Date   Arthritis    GERD (gastroesophageal reflux disease)    Hyperlipidemia    Hypertension    Lewy body dementia ([place])    Osteoporosis    Prostate enlargement    Past Surgical History:  Procedure Laterality  Date   CHOLECYSTECTOMY N/A 09/10/2018   Procedure: LAPAROSCOPIC CHOLECYSTECTOMY;  Surgeon: Benjamine Sprague, DO;  Location: [facility];  Service: General;  Laterality: N/A;   CYSTOSCOPY WITH LITHOLAPAXY N/A 10/20/2020   Procedure: CYSTOSCOPY WITH LITHOLAPAXY;  Surgeon: [name], MD;  Location: [facility];  Service: Urology;  Laterality: N/A;   GANGLION CYST EXCISION Left 2013   Left upper arm   HERNIA REPAIR N/A 2008   MIdline, ventral acquired hernia repair   HOLEP-LASER ENUCLEATION OF THE PROSTATE WITH MORCELLATION N/A 11/06/2018   Procedure: HOLEP-LASER ENUCLEATION OF THE PROSTATE WITH MORCELLATION;  Surgeon: [name], MD;  Location: [facility];  Service: Urology;  Laterality: N/A;   HOLEP-LASER ENUCLEATION OF THE PROSTATE WITH MORCELLATION N/A 10/20/2020   Procedure: HOLEP-LASER ENUCLEATION OF THE PROSTATE WITH MORCELLATION;  Surgeon: [name], MD;  Location: [facility];  Service: Urology;  Laterality: N/A;   Social History:  reports that he quit smoking about 55 years ago. His smoking use included cigarettes. He has a 12.00 pack-year smoking history. He has never used smokeless tobacco. He reports that he does not drink alcohol and does not use drugs.  Allergies  Allergen Reactions   Antihistamines, Chlorpheniramine-Type Other (See Comments)    Can't urinate    Family History  Problem Relation Age of Onset   Breast cancer Mother    Lung cancer Father    Heart disease Brother    Leukemia Brother    Prostate cancer Neg Hx    Colon  cancer Neg Hx    Bladder Cancer Neg Hx    Kidney cancer Neg Hx     Prior to Admission medications   Medication Sig Start Date End Date Taking? Authorizing Provider  acetaminophen (TYLENOL) 500 MG tablet Take 1,000 mg by mouth every 6 (six) hours as needed for moderate pain.    [provider]  carbidopa-levodopa (SINEMET CR) 50-200 MG tablet Take 1 tablet by mouth 4 (four) times daily. 06/22/20   [provider]   cholecalciferol (VITAMIN D) 1000 units tablet Take 1,000 Units by mouth daily.    [provider]  Coenzyme Q10 (CO Q 10) 100 MG CAPS Take 100 mg by mouth daily.     [provider]  diclofenac Sodium (VOLTAREN) 1 % GEL APPLY 2 G TOPICALLY 4 (FOUR) TIMES DAILY AS NEEDED. TO KNEE ARTHRITIS PAIN 09/28/20   [name], DO  enalapril (VASOTEC) 5 MG tablet Take 1 tablet (5 mg total) by mouth daily. 12/05/20   Karamalegos, Devonne Doughty, DO  fluticasone (FLONASE) 50 MCG/ACT nasal spray Place 2 sprays into both nostrils as needed for allergies.    [provider]  furosemide (LASIX) 20 MG tablet Take 1 tablet (20 mg total) by mouth every other day. 07/06/21   Kate Sable, MD  melatonin 3 MG TABS tablet Take 3 mg by mouth at bedtime.    [provider]  Multiple Vitamin (MULTIVITAMIN WITH MINERALS) TABS tablet Take 1 tablet by mouth daily. 10/12/19   [name], MD  Omega-3 Fatty Acids (FISH OIL) 1200 MG CAPS Take 1,200 mg by mouth in the morning, at noon, in the evening, and at bedtime.    [provider]  omeprazole (PRILOSEC) 20 MG capsule Take 40 mg by mouth daily.    [provider]  rivastigmine (EXELON) 3 MG capsule Take 3 mg by mouth 2 (two) times daily.    [provider]  sertraline (ZOLOFT) 50 MG tablet Take 50 mg by mouth at bedtime. 06/22/20   [provider]  traMADol (ULTRAM) 50 MG tablet Take 1 tablet (50 mg total) by mouth every 8 (eight) hours as needed. 05/05/20   Karamalegos, Devonne Doughty, DO  vitamin C (ASCORBIC ACID) 500 MG tablet Take 500 mg by mouth daily.    [provider]  zinc sulfate 220 (50 Zn) MG capsule Take 1 capsule (220 mg total) by mouth daily. 10/12/19   [name], MD    Physical Exam: Vitals:   07/28/21 1313 07/28/21 1705 07/28/21 1714 07/28/21 1915  BP:  (!) 144/86 (!) 144/86 (!) 171/93  Pulse:   86 70  Resp:  '20 20 18  '$ Temp:   98.5 F (36.9 C) 98.5 F  (36.9 C)  TempSrc:   Oral Oral  SpO2:  96% 100% 100%  Weight: 99.8 kg     Height: 6' (1.829 m)      Physical Exam Vitals and nursing note reviewed.  Constitutional:      General: He is not in acute distress.    Comments: Restless  HENT:     Head: Normocephalic and atraumatic.  Cardiovascular:     Rate and Rhythm: Normal rate and regular rhythm.     Heart sounds: Normal heart sounds.  Pulmonary:     Effort: Pulmonary effort is normal.     Breath sounds: Normal breath sounds.  Abdominal:     Palpations: Abdomen is soft.     Tenderness: There is no abdominal tenderness.  Neurological:  Mental Status: He is disoriented.     Labs on Admission: I have personally reviewed following labs and imaging studies  CBC: Recent Labs  Lab 07/27/21 1505 07/28/21 1859  WBC 5.8 6.6  NEUTROABS  --  4.3  HGB 12.5* 12.2*  HCT 39.2 37.8*  MCV 94.9 94.3  PLT 190 010   Basic Metabolic Panel: Recent Labs  Lab 07/27/21 1505 07/28/21 1859  NA 138 138  K 3.8 3.9  CL 102 102  CO2 29 29  GLUCOSE 100* 98  BUN 23 28*  CREATININE 0.76 0.72  CALCIUM 8.8* 8.6*   GFR: Estimated Creatinine Clearance: 82.6 mL/min (by C-G formula based on SCr of 0.72 mg/dL). Liver Function Tests: Recent Labs  Lab 07/27/21 1505 07/28/21 1859  AST 46* 24  ALT 9 14  ALKPHOS 101 90  BILITOT 0.9 1.3*  PROT 7.2 6.8  ALBUMIN 3.7 3.6   No results for input(s): "LIPASE", "AMYLASE" in the last 168 hours. No results for input(s): "AMMONIA" in the last 168 hours. Coagulation Profile: No results for input(s): "INR", "PROTIME" in the last 168 hours. Cardiac Enzymes: No results for input(s): "CKTOTAL", "CKMB", "CKMBINDEX", "TROPONINI" in the last 168 hours. BNP (last 3 results) No results for input(s): "PROBNP" in the last 8760 hours. HbA1C: No results for input(s): "HGBA1C" in the last 72 hours. CBG: No results for input(s): "GLUCAP" in the last 168 hours. Lipid Profile: No results for input(s):  "CHOL", "HDL", "LDLCALC", "TRIG", "CHOLHDL", "LDLDIRECT" in the last 72 hours. Thyroid Function Tests: No results for input(s): "TSH", "T4TOTAL", "FREET4", "T3FREE", "THYROIDAB" in the last 72 hours. Anemia Panel: No results for input(s): "VITAMINB12", "FOLATE", "FERRITIN", "TIBC", "IRON", "RETICCTPCT" in the last 72 hours. Urine analysis:    Component Value Date/Time   COLORURINE YELLOW (A) 07/28/2021 1459   APPEARANCEUR CLEAR (A) 07/28/2021 1459   APPEARANCEUR Cloudy (A) 08/17/2020 1024   LABSPEC 1.019 07/28/2021 1459   PHURINE 6.0 07/28/2021 1459   GLUCOSEU NEGATIVE 07/28/2021 1459   HGBUR NEGATIVE 07/28/2021 1459   BILIRUBINUR NEGATIVE 07/28/2021 1459   BILIRUBINUR Negative 08/17/2020 1024   BILIRUBINUR negative 04/17/2015 0826   KETONESUR 5 (A) 07/28/2021 1459   PROTEINUR NEGATIVE 07/28/2021 1459   UROBILINOGEN 0.2 08/09/2020 1319   UROBILINOGEN Normal 04/17/2015 0826   NITRITE NEGATIVE 07/28/2021 1459   LEUKOCYTESUR NEGATIVE 07/28/2021 1459   LEUKOCYTESUR negative 04/17/2015 0826    Radiological Exams on Admission: CT Hip Left Wo Contrast  Result Date: 07/28/2021 CLINICAL DATA:  Hip pain, stress fracture suspected. Negative x-ray. Patient fell on Tuesday 07/24/2021 night. EXAM: CT OF THE LEFT HIP WITHOUT CONTRAST TECHNIQUE: Multidetector CT imaging of the left hip was performed according to the standard protocol. Multiplanar CT image reconstructions were also generated. RADIATION DOSE REDUCTION: This exam was performed according to the departmental dose-optimization program which includes automated exposure control, adjustment of the mA and/or kV according to patient size and/or use of iterative reconstruction technique. COMPARISON:  None Available. FINDINGS: Bones/Joint/Cartilage No evidence of acute fracture or dislocation. Chronic healed fracture of the left superior and inferior pubic rami. Advanced left hip osteoarthritis with bone-on-bone articulation and subchondral cystic  changes and prominent marginal osteophytes. No appreciable joint effusion. Mild degenerative changes of the left sacroiliac joint and pubic symphysis. Ligaments Suboptimally assessed by CT. Muscles and Tendons No acute abnormality. No intramuscular hematoma or fluid collection. Soft tissues Skin and subcutaneous soft tissues are unremarkable. IMPRESSION: 1.  No acute fracture or dislocation. 2. Severe left hip osteoarthritis. Chronic healed fracture  of the left superior and inferior pubic rami. 3. Muscles and subcutaneous soft tissues are unremarkable without evidence of acute abnormality. Electronically Signed   By: Keane Police D.O.   On: 07/28/2021 18:56   DG HIP UNILAT WITH PELVIS 2-3 VIEWS LEFT  Result Date: 07/28/2021 CLINICAL DATA:  Recent fall, pain EXAM: DG HIP (WITH OR WITHOUT PELVIS) 2-3V LEFT COMPARISON:  None Available. FINDINGS: No recent fracture or dislocation is seen. Deformity in the left superior pubis ramus was also evident in previous CT done on 09/08/2018. Severe degenerative changes are noted in left hip joint with joint space, subcortical cysts narrowing and bony spurs. IMPRESSION: No recent fracture or dislocation is seen in left hip. Severe degenerative changes are noted in the left hip. Deformity in the superior ramus of left pubic bone may be residual from previous injury. Electronically Signed   By: Elmer Picker M.D.   On: 07/28/2021 13:53   CT HEAD WO CONTRAST (5MM)  Result Date: 07/27/2021 CLINICAL DATA:  Altered mental status EXAM: CT HEAD WITHOUT CONTRAST TECHNIQUE: Contiguous axial images were obtained from the base of the skull through the vertex without intravenous contrast. RADIATION DOSE REDUCTION: This exam was performed according to the departmental dose-optimization program which includes automated exposure control, adjustment of the mA and/or kV according to patient size and/or use of iterative reconstruction technique. COMPARISON:  10/12/2019 FINDINGS: Brain:  No acute intracranial findings are seen. There are no signs of bleeding within the cranium. Cortical sulci are prominent. There is decreased density in periventricular white matter suggesting small-vessel disease. Vascular: Unremarkable. Skull: There is small bony defect in the posterior wall of right frontal sinus on the left side. Similar finding was seen on the previous study. Sinuses/Orbits: There is partial opacification of left side of frontal sinus. There are no air-fluid levels in the paranasal sinuses. Other: None. IMPRESSION: No acute intracranial findings are seen in noncontrast CT brain. Atrophy. Small-vessel disease. There is defect in the posterior wall of left frontal sinus, possibly related to infectious process or previous intervention in the left frontal sinus. There is no evidence of any edema or mass effect in the adjacent brain. Electronically Signed   By: Elmer Picker M.D.   On: 07/27/2021 17:07     Data Reviewed: Relevant notes from primary care and specialist visits, past discharge summaries as available in EHR, including Care Everywhere. Prior diagnostic testing as pertinent to current admission diagnoses Updated medications and problem lists for reconciliation ED course, including vitals, labs, imaging, treatment and response to treatment Triage notes, nursing and pharmacy notes and ED provider's notes Notable results as noted in HPI   Assessment and Plan: * Ambulatory dysfunction Left hip pain secondary to recent fall, history of falls Severe degenerative joint disease Suspect multifactorial and related to pain from recent fall, severe DJD, Lower suspicion for acute CVA Continue Parkinson meds Pain control Fall precautions PT eval  Altered mental status Family reported altered mental status from baseline since fall on 7/18 Possibility of stroke.  Could be progression of dementia, parkinsonism, side effects of pain meds CT head done on 7/21 shows no acute  intracranial injury Follow-up MRI,  If MRI positive for stroke will get stroke work-up with carotid Doppler and echo Telemetry monitoring for arrhythmias Swallow eval, neurologic checks Fall precautions    Unable to care for self Caregiver unable to cope PT and TOC evaluation Patient might need placement  Hypertensive urgency BP elevated to 171/93 Continue enalapril.  Currently on Lasix  for unclear reason but will continue  Lewy body dementia without behavioral disturbance (HCC) Parkinsonism Last seen by neurology in May 2023 Continue carbidopa-levodopa, rivastigmine and Zoloft Delirium precautions - Minimize/avoid deliriogenic meds including: anticholinergic, opiates, benzodiazepines           - Maintain hydration, oxygenation, nutrition           - Limit use of restraints and catheters           - Normalize sleep patterns by minimizing nighttime noise, light and interruptions by                -Ensure sleep apnea treatment is provided overnight.             clustering care, opening blinds during the day           - Reorient the patient frequently, provide easily visible clock and calendar           - Provide sensory aids like glasses, hearing aids           - Encourage ambulation, regular activities and visitors to maintain cognitive stimulation              -Patient would benefit from having family members at bedside to reinforce his orientation.   History of prostate cancer Followed annually by urology and was last seen a couple months ago on chart review  Osteoarthritis of multiple joints Continue acetaminophen and tramadol    DVT prophylaxis: Lovenox  Consults: neurology, Dr Quinn Axe  Advance Care Planning:   Code Status: Prior   Family Communication: Daughter at bedside  Disposition Plan: Back to previous home environment  Severity of Illness: The appropriate patient status for this patient is INPATIENT. Inpatient status is judged to be reasonable and  necessary in order to provide the required intensity of service to ensure the patient's safety. The patient's presenting symptoms, physical exam findings, and initial radiographic and laboratory data in the context of their chronic comorbidities is felt to place them at high risk for further clinical deterioration. Furthermore, it is not anticipated that the patient will be medically stable for discharge from the hospital within 2 midnights of admission.   * I certify that at the point of admission it is my clinical judgment that the patient will require inpatient hospital care spanning beyond 2 midnights from the point of admission due to high intensity of service, high risk for further deterioration and high frequency of surveillance required.*  Author: Athena Masse, MD 07/28/2021 10:20 PM  For on call review [email or web address].

## 2021-07-28 NOTE — Assessment & Plan Note (Addendum)
Blood pressure remained elevated. -Increase the dose of enalapril to 10 mg daily.  Currently on Lasix for unclear reason but will continue

## 2021-07-28 NOTE — ED Provider Notes (Signed)
[facility] Provider Note    Event Date/Time   First MD Initiated Contact with Patient 07/28/21 1809     (approximate)   History   Altered Mental Status   HPI  Randy [name] is a 87 y.o. male [place] Parkinson's as well as dementia presents to the ER for evaluation of worsening ambulation altered mental status abnormal speech for the past few days.  Patient came to the ER yesterday but left due to prolonged wait return to the ER today as they are unable to manage him at home as he is no longer able to ambulate.  Has not made any changes to his Parkinson's medication.  Has not had any recent falls but has been having some left hip pain.  No fevers no chills.  No dysuria no diarrhea.  Normal appetite.     Physical Exam   Triage Vital Signs: ED Triage Vitals  Enc Vitals Group     BP 07/28/21 1306 137/80     Pulse Rate 07/28/21 1306 84     Resp 07/28/21 1306 16     Temp 07/28/21 1306 98.1 F (36.7 C)     Temp Source 07/28/21 1306 Oral     SpO2 07/28/21 1306 94 %     Weight 07/28/21 1313 220 lb (99.8 kg)     Height 07/28/21 1313 6' (1.829 m)     Head Circumference --      Peak Flow --      Pain Score 07/28/21 1313 9     Pain Loc --      Pain Edu? --      Excl. in House? --     Most recent vital signs: Vitals:   07/28/21 1714 07/28/21 1915  BP: (!) 144/86 (!) 171/93  Pulse: 86 70  Resp: 20 18  Temp: 98.5 F (36.9 C) 98.5 F (36.9 C)  SpO2: 100% 100%     Constitutional: Alert  Eyes: Conjunctivae are normal.  Head: Atraumatic. Nose: No congestion/rhinnorhea. Mouth/Throat: Mucous membranes are moist.   Neck: Painless ROM.  Cardiovascular:   Good peripheral circulation. Respiratory: Normal respiratory effort.  No retractions.  Gastrointestinal: Soft and nontender.  Musculoskeletal:  no deformity Neurologic:  MAE spontaneously. No gross focal neurologic deficits are appreciated.  Skin:  Skin is warm, dry and intact. No rash  noted. Psychiatric: Mood and affect are normal. Speech and behavior are normal.    ED Results / Procedures / Treatments   Labs (all labs ordered are listed, but only abnormal results are displayed) Labs Reviewed  URINALYSIS, ROUTINE W REFLEX MICROSCOPIC - Abnormal; Notable for the following components:      Result Value   Color, Urine YELLOW (*)    APPearance CLEAR (*)    Ketones, ur 5 (*)    All other components within normal limits  CBC WITH DIFFERENTIAL/PLATELET - Abnormal; Notable for the following components:   RBC 4.01 (*)    Hemoglobin 12.2 (*)    HCT 37.8 (*)    All other components within normal limits  COMPREHENSIVE METABOLIC PANEL - Abnormal; Notable for the following components:   BUN 28 (*)    Calcium 8.6 (*)    Total Bilirubin 1.3 (*)    All other components within normal limits  TROPONIN I (HIGH SENSITIVITY)  TROPONIN I (HIGH SENSITIVITY)     EKG  ED ECG REPORT I, Merlyn Lot, the attending physician, personally viewed and interpreted this ECG.   Date: 07/28/2021  EKG Time: 15:04  Rate: 80  Rhythm: sinus  Axis: left  Intervals:lbbb  ST&T Change: no stemi, no depressions    RADIOLOGY Please see ED Course for my review and interpretation.  I personally reviewed all radiographic images ordered to evaluate for the above acute complaints and reviewed radiology reports and findings.  These findings were personally discussed with the patient.  Please see medical record for radiology report.    PROCEDURES:  Critical Care performed: No  Procedures   MEDICATIONS ORDERED IN ED: Medications  LORazepam (ATIVAN) tablet 0.5 mg (0.5 mg Oral Given 07/28/21 2049)  acetaminophen (TYLENOL) tablet 650 mg (650 mg Oral Given 07/28/21 2124)     IMPRESSION / MDM / ASSESSMENT AND PLAN / ED COURSE  I reviewed the triage vital signs and the nursing notes.                              Differential diagnosis includes, but is not limited to, Dehydration,  sepsis, pna, uti, hypoglycemia, cva, drug effect, withdrawal, encephalitis  She presented to the ER for evaluation of symptoms as described above.  This presenting complaint could reflect a potentially life-threatening illness therefore the patient will be placed on continuous pulse oximetry and telemetry for monitoring.  Laboratory evaluation will be sent to evaluate for the above complaints.     Clinical Course as of 07/28/21 2144  Sat Jul 28, 2021  2113 CT imaging without evidence of fracture.  Will order MRI.  Patient not at his baseline according to family they are unable to care for him.  I will consult hospitalist for admission for possible placement and further work-up. [PR]    Clinical Course User Index [PR] Merlyn Lot, MD     FINAL CLINICAL IMPRESSION(S) / ED DIAGNOSES   Final diagnoses:  Altered mental status, unspecified altered mental status type     Rx / DC Orders   ED Discharge Orders     None        Note:  This document was prepared using Dragon voice recognition software and may include unintentional dictation errors.    Merlyn Lot, MD 07/28/21 2144

## 2021-07-29 ENCOUNTER — Inpatient Hospital Stay: Payer: Medicare Other

## 2021-07-29 DIAGNOSIS — G3183 Dementia with Lewy bodies: Secondary | ICD-10-CM | POA: Diagnosis not present

## 2021-07-29 DIAGNOSIS — F0280 Dementia in other diseases classified elsewhere without behavioral disturbance: Secondary | ICD-10-CM

## 2021-07-29 DIAGNOSIS — G20 Parkinson's disease: Secondary | ICD-10-CM | POA: Diagnosis not present

## 2021-07-29 DIAGNOSIS — R262 Difficulty in walking, not elsewhere classified: Secondary | ICD-10-CM | POA: Diagnosis not present

## 2021-07-29 LAB — LIPID PANEL
Cholesterol: 154 mg/dL (ref 0–200)
HDL: 41 mg/dL (ref >40)
LDL Cholesterol: 102 mg/dL — ABNORMAL HIGH (ref 0–99)
Total CHOL/HDL Ratio: 3.8 RATIO
Triglycerides: 57 mg/dL (ref <150)
VLDL: 11 mg/dL (ref 0–40)

## 2021-07-29 LAB — HEMOGLOBIN A1C
Hgb A1c MFr Bld: 5.3 % (ref 4.8–5.6)
Mean Plasma Glucose: 105.41 mg/dL

## 2021-07-29 MED ORDER — HALOPERIDOL LACTATE 5 MG/ML IJ SOLN
0.5000 mg | Freq: Four times a day (QID) | INTRAMUSCULAR | Status: DC | PRN
  Administered 2021-07-29: 0.5 mg via INTRAVENOUS
  Filled 2021-07-29: qty 1

## 2021-07-29 NOTE — Hospital Course (Addendum)
Randy [name] is a 87 y.o. male with medical history significant for Lewy body dementia, parkinsonism followed by neurology, osteoarthritis, prostate cancer, essential hypertension, brought in by family who reports a decline in mobility since a fall on 7/18.  Patient did not hit his head and had no loss of consciousness and did not appear to suffer any apparent injury but seems to have difficulty ambulating.  He appears to have pain in his left hip and has not been participating with his self-care as of his baseline.  Since the fall he also appears to have worsening of his baseline mentation and slurred speech.  Admitted the following day for AMS and difficulty ambulating.    He appears to have pain in his left hip and has not been participating with his self-care as of his baseline.  Since the fall he also appears to have worsening of his baseline mentation and slurred speech.  Patient presented to the ED the day prior, and was evaluated with a head CT that showed no acute intracranial findings.  They left without being seen due to prolonged ED wait times but returned today due to continued symptoms. ED course and data review: Afebrile, pulse 70, respirations 18, BP 171/93 with O2 sat of 100% on room air. Labs: CBC normal except for hemoglobin of 12.2.  CMP for the most part unremarkable, troponin 9.  Urinalysis sterile. X-ray left hip showing severe degenerative changes but no recent fracture or dislocation CT hip confirms no acute fracture or dislocation, severe left hip osteoarthritis and chronic healed fracture of the left superior and inferior pubic rami.  MRI brain normal.   Neurology was consulted and according to the recommendation it looks like having some advancement in his parkinsonism and Lewy body dementia.  Per neurology he is on the right track of medications and no changes were made.  Due to difficulty in ambulation PT and OT evaluation was obtained and they recommended rehab.  7/24:  Patient remained stable, close to baseline and waiting for rehab placement. B12 levels at 257, neurology recommending starting supplement to keep the levels above 400. B12 supplement was started.  7/25: Patient remained stable.  Had an assisted fall where while transferring from bedside commode to bed his legs gave away and he was generally layed to the ground, no injuries.  Patient is being discharged to rehab for further management. He will continue current medications and follow-up with his outpatient providers especially outpatient neurology for further recommendations as there is a concern of Parkinson's progression.

## 2021-07-29 NOTE — Progress Notes (Addendum)
Progress Note   Patient: Randy [name] GGY:[id number] DOB: 01-05-1935 DOA: 07/28/2021     1 DOS: the patient was seen and examined on 07/29/2021   Brief hospital course:  Randy [name] is a 87 y.o. male with medical history significant for Lewy body dementia, parkinsonism followed by neurology, osteoarthritis, prostate cancer, essential hypertension, brought in by family who reports a decline in mobility since a fall on 7/18.  Patient did not hit his head and had no loss of consciousness and did not appear to suffer any apparent injury but seems to have difficulty ambulating.  He appears to have pain in his left hip and has not been participating with his self-care as of his baseline.  Since the fall he also appears to have worsening of his baseline mentation and slurred speech.  Patient presented to the ED the day prior, and was evaluated with a head CT that showed no acute intracranial findings.  They left without being seen due to prolonged ED wait times but returned today due to continued symptoms. ED course and data review: Afebrile, pulse 70, respirations 18, BP 171/93 with O2 sat of 100% on room air. Labs: CBC normal except for hemoglobin of 12.2.  CMP for the most part unremarkable, troponin 9.  Urinalysis sterile. X-ray left hip showing severe degenerative changes but no recent fracture or dislocation CT hip confirms no acute fracture or dislocation, severe left hip osteoarthritis and chronic healed fracture of the left superior and inferior pubic rami.  MRI brain ordered, normal.   Assessment and Plan: Ambulatory dysfunction Left hip pain secondary to recent fall, history of falls Severe degenerative joint disease Wife reports acute change since his last fall 7/18, and since pt has had a lot of difficulty ambulating. She reports that pain is also in the right hip, though initially reported left hip pain. Prior to fall pt was able to ambulate, get to bathroom on own and since fall he has not  been able to. She also reports pt requires significant assistance at home and she is only able to help after work. During the day he has a caretaker who can help somewhat. Suspect pain is multifactorial and related to pain from recent fall, severe DJD. Xray and CT of left hip without any acute findings. Would benefit from PT evaluation.  -Continue Parkinson meds -Pain control -Fall precautions -PT and TOC eval -right hip xr  Altered mental status, resolved Family reported altered mental status from baseline since fall on 7/18. Etiology unclear, though seems back to his baseline per wife. He is A&O x 3 on exam today. No sources of infection. Possibly related to pain from severe pain or progression of his LBD.  -d/w his neurologist 7/24 if medication adjustment is needed -TSH  Hypertension BP elevated to 171/93 on admit.  -restart enalapril 5 mg -restart lasix 20 mg   Lewy body dementia without behavioral disturbance (HCC) Parkinsonism Last seen by neurology in May 2023 Continue carbidopa-levodopa, rivastigmine and Zoloft Delirium precautions - Minimize/avoid deliriogenic meds including: anticholinergic, opiates, benzodiazepines           - Maintain hydration, oxygenation, nutrition           - Limit use of restraints and catheters           - Normalize sleep patterns by minimizing nighttime noise, light and interruptions by                -Ensure sleep apnea treatment is provided overnight.  clustering care, opening blinds during the day           - Reorient the patient frequently, provide easily visible clock and calendar           - Provide sensory aids like glasses, hearing aids           - Encourage ambulation, regular activities and visitors to maintain cognitive stimulation              -Patient would benefit from having family members at bedside to reinforce his orientation. Haldol prn agitation  History of prostate cancer Followed annually by urology and was last  seen a couple months ago on chart review  Normocytic anemia At b/l.   Elevated bilirubin 1.3 on admit, no abdominal pain.   Osteoarthritis of multiple joints Continue acetaminophen and tramadol  Dvt PPX: lovenox Code: Full Diet: regular FENGI:No acute concerns currently     Subjective:  Information obtained from patient and wife. Pt reports left and right hip pain. Wife reports she has also noticed right hip pain. She reports he is back to baseline. She reports having some trouble managing at home.   Physical Exam: Vitals:   07/29/21 0542 07/29/21 0700 07/29/21 0838 07/29/21 0838  BP: (!) 162/84 (!) 170/90 (!) 170/91 (!) 170/91  Pulse: 61 76 67 68  Resp: '15 16 18 18  '$ Temp:  98.4 F (36.9 C) 98.3 F (36.8 C) 98.3 F (36.8 C)  TempSrc:  Oral Oral Oral  SpO2: 99% 99% 97% 97%  Weight:      Height:       Physical Exam Vitals and nursing note reviewed.  Constitutional:      General: He is not in acute distress.    Appearance: He is not diaphoretic.  HENT:     Head: Atraumatic.  Eyes:     Pupils: Pupils are equal, round, and reactive to light.  Cardiovascular:     Rate and Rhythm: Normal rate and regular rhythm.     Pulses: Normal pulses.     Heart sounds: No murmur heard. Pulmonary:     Effort: Pulmonary effort is normal. No respiratory distress.     Breath sounds: Normal breath sounds. No rales.  Abdominal:     General: Abdomen is flat. There is no distension.     Palpations: Abdomen is soft.     Tenderness: There is no abdominal tenderness. There is no rebound.  Musculoskeletal:     Right lower leg: No edema.     Left lower leg: No edema.  Skin:    General: Skin is warm and dry.     Capillary Refill: Capillary refill takes less than 2 seconds.  Neurological:     Mental Status: He is alert and oriented to person, place, and time. Mental status is at baseline.  Psychiatric:        Mood and Affect: Mood normal.     Data Reviewed:     Latest Ref Rng &  Units 07/28/2021   11:19 PM 07/28/2021    6:59 PM 07/27/2021    3:05 PM  CBC  WBC 4.0 - 10.5 K/uL 6.1  6.6  5.8   Hemoglobin 13.0 - 17.0 g/dL 11.6  12.2  12.5   Hematocrit 39.0 - 52.0 % 35.3  37.8  39.2   Platelets 150 - 400 K/uL 175  185  190       Latest Ref Rng & Units 07/28/2021   11:19 PM 07/28/2021  6:59 PM 07/27/2021    3:05 PM  BMP  Glucose 70 - 99 mg/dL  98  100   BUN 8 - 23 mg/dL  28  23   Creatinine 0.61 - 1.24 mg/dL 0.72  0.72  0.76   Sodium 135 - 145 mmol/L  138  138   Potassium 3.5 - 5.1 mmol/L  3.9  3.8   Chloride 98 - 111 mmol/L  102  102   CO2 22 - 32 mmol/L  29  29   Calcium 8.9 - 10.3 mg/dL  8.6  8.8    MRI: Truncated examination. No acute ischemia.  CT left hip: 1.  No acute fracture or dislocation.   2. Severe left hip osteoarthritis. Chronic healed fracture of the left superior and inferior pubic rami.   3. Muscles and subcutaneous soft tissues are unremarkable without evidence of acute abnormality.   Left hip xr: No recent fracture or dislocation is seen in left hip. Severe degenerative changes are noted in the left hip. Deformity in the superior ramus of left pubic bone may be residual from previous injury.  Family Communication: d/w wife at bedside  Disposition: Status is: Inpatient Remains inpatient appropriate because: amulatory weakness  Planned Discharge Destination: Skilled nursing facility    Time spent: 40 minutes  Author: Lorelei Pont, MD 07/29/2021 1:31 PM  For on call review [email or web address].

## 2021-07-29 NOTE — NC FL2 (Addendum)
[place] LEVEL OF CARE SCREENING TOOL     IDENTIFICATION  Patient Name: Randy [name] Birthdate: [birth year] Sex: male Admission Date (Current Location): 07/28/2021  [facility] and [place] Number:  Engineering geologist and Address:  [facility], [address], [place], [place] [address]      Provider Number: [id number]  Attending Physician Name and Address:  Athena Masse, MD  Relative Name and Phone Number:  [name] (Daughter)   [phone] (Home Phone)    Current Level of Care: Hospital Recommended Level of Care: [place] Prior Approval Number:    Date Approved/Denied:   PASRR Number: [id number]   Discharge Plan: SNF    Current Diagnoses: Patient Active Problem List   Diagnosis Date Noted   Ambulatory dysfunction 07/28/2021   Hypertensive urgency 07/28/2021   History of prostate cancer 07/28/2021   Parkinsonism ([place]) 07/28/2021   Altered mental status 07/28/2021   Swelling of limb 12/26/2020   Bilateral primary osteoarthritis of knee 02/15/2020   Pressure injury of buttock, stage 1    Delirium    COVID-19 virus infection 10/08/2019   Unable to care for self 10/08/2019   Caregiver unable to cope 10/08/2019   Recurrent falls 10/08/2019   Lewy body dementia without behavioral disturbance ([place])    Prostate cancer ([place]) 06/30/2019   BPH with urinary obstruction 11/06/2018   Acute cholecystitis 09/08/2018   Myalgia due to statin 02/24/2018   Tricompartment osteoarthritis of left knee 11/14/2017   Generalized weakness 04/28/2017   Chronic low back pain 03/12/2017   Knee pain, chronic 10/01/2016   Chronic venous insufficiency 08/30/2016   BPH without obstruction/lower urinary tract symptoms 03/06/2016   Osteoarthritis of multiple joints 03/06/2016   Degenerative joint disease (DJD) of lumbar spine 03/06/2016   Essential hypertension 03/05/2016   Hyperlipidemia 03/05/2016   Osteoporosis 03/05/2016   GERD  (gastroesophageal reflux disease) 03/05/2016   Bilateral lower extremity edema 03/05/2016   Dupuytren's disease of palm 05/28/2015    Orientation RESPIRATION BLADDER Height & Weight     Self, Place  Normal Indwelling catheter Weight: 99.8 kg Height:  6' (182.9 cm)  BEHAVIORAL SYMPTOMS/MOOD NEUROLOGICAL BOWEL NUTRITION STATUS        Diet (regular)  AMBULATORY STATUS COMMUNICATION OF NEEDS Skin   Limited Assist Verbally Bruising                       Personal Care Assistance Level of Assistance  Bathing, Dressing Bathing Assistance: Limited assistance   Dressing Assistance: Limited assistance     Functional Limitations Info             SPECIAL CARE FACTORS FREQUENCY  PT (By licensed PT), OT (By licensed OT)     PT Frequency: 5 times a week OT Frequency: 5 times a week            Contractures Contractures Info: Not present    Additional Factors Info  Code Status, Allergies Code Status Info: Full Allergies Info: Antihistamines, Chlorpheniramine-type Antihistamines, Chlorpheniramine-type  Other (See Comments) Not Specified  07/06/2012 Past Updates  Can't urinate           Current Medications (07/29/2021):  This is the current hospital active medication list Current Facility-Administered Medications  Medication Dose Route Frequency Provider Last Rate Last Admin   acetaminophen (TYLENOL) tablet 650 mg  650 mg Oral Q4H PRN Athena Masse, MD       Or   acetaminophen (TYLENOL) 160  MG/5ML solution 650 mg  650 mg Per Tube Q4H PRN Athena Masse, MD       Or   acetaminophen (TYLENOL) suppository 650 mg  650 mg Rectal Q4H PRN Athena Masse, MD       carbidopa-levodopa (SINEMET CR) 50-200 MG per tablet controlled release 1 tablet  1 tablet Oral QID Athena Masse, MD   1 tablet at 07/29/21 1325   enalapril (VASOTEC) tablet 5 mg  5 mg Oral Daily Judd Gaudier V, MD   5 mg at 07/29/21 0926   enoxaparin (LOVENOX) injection 40 mg  40 mg Subcutaneous Q24H Judd Gaudier V, MD       furosemide (LASIX) tablet 20 mg  20 mg Oral Jeralene Huff, MD   20 mg at 07/29/21 1828   LORazepam (ATIVAN) tablet 0.5 mg  0.5 mg Oral Q4H PRN Merlyn Lot, MD   0.5 mg at 07/29/21 0114   melatonin tablet 5 mg  5 mg Oral QHS Athena Masse, MD   5 mg at 07/29/21 0113   rivastigmine (EXELON) capsule 3 mg  3 mg Oral BID Athena Masse, MD   3 mg at 07/29/21 8337   sertraline (ZOLOFT) tablet 50 mg  50 mg Oral QHS Athena Masse, MD         Discharge Medications: Please see discharge summary for a list of discharge medications.  Relevant Imaging Results:  Relevant Lab Results:   Additional Information SS# [id number], Active with [facility] for palliative care  Valente David, RN

## 2021-07-29 NOTE — Evaluation (Signed)
Speech Language Pathology Evaluation Patient Details Name: Randy [name] MRN: [id number] DOB: [birth year] Today's Date: 07/29/2021 Time: 8676-1950 SLP Time Calculation (min) (ACUTE ONLY): 20 min  Problem List:  Patient Active Problem List   Diagnosis Date Noted   Ambulatory dysfunction 07/28/2021   Hypertensive urgency 07/28/2021   History of prostate cancer 07/28/2021   Parkinsonism ([place]) 07/28/2021   Altered mental status 07/28/2021   Swelling of limb 12/26/2020   Bilateral primary osteoarthritis of knee 02/15/2020   Pressure injury of buttock, stage 1    Delirium    COVID-19 virus infection 10/08/2019   Unable to care for self 10/08/2019   Caregiver unable to cope 10/08/2019   Recurrent falls 10/08/2019   Lewy body dementia without behavioral disturbance ([place])    Prostate cancer ([place]) 06/30/2019   BPH with urinary obstruction 11/06/2018   Acute cholecystitis 09/08/2018   Myalgia due to statin 02/24/2018   Tricompartment osteoarthritis of left knee 11/14/2017   Generalized weakness 04/28/2017   Chronic low back pain 03/12/2017   Knee pain, chronic 10/01/2016   Chronic venous insufficiency 08/30/2016   BPH without obstruction/lower urinary tract symptoms 03/06/2016   Osteoarthritis of multiple joints 03/06/2016   Degenerative joint disease (DJD) of lumbar spine 03/06/2016   Essential hypertension 03/05/2016   Hyperlipidemia 03/05/2016   Osteoporosis 03/05/2016   GERD (gastroesophageal reflux disease) 03/05/2016   Bilateral lower extremity edema 03/05/2016   Dupuytren's disease of palm 05/28/2015   Past Medical History:  Past Medical History:  Diagnosis Date   Arthritis    GERD (gastroesophageal reflux disease)    Hyperlipidemia    Hypertension    Lewy body dementia ([place])    Osteoporosis    Prostate enlargement    Past Surgical History:  Past Surgical History:  Procedure Laterality Date   CHOLECYSTECTOMY N/A 09/10/2018   Procedure: LAPAROSCOPIC CHOLECYSTECTOMY;   Surgeon: Benjamine Sprague, DO;  Location: [facility] ORS;  Service: General;  Laterality: N/A;   CYSTOSCOPY WITH LITHOLAPAXY N/A 10/20/2020   Procedure: CYSTOSCOPY WITH LITHOLAPAXY;  Surgeon: [name], MD;  Location: [facility] ORS;  Service: Urology;  Laterality: N/A;   GANGLION CYST EXCISION Left 2013   Left upper arm   HERNIA REPAIR N/A 2008   M[place]line, ventral acquired hernia repair   HOLEP-LASER ENUCLEATION OF THE PROSTATE WITH MORCELLATION N/A 11/06/2018   Procedure: HOLEP-LASER ENUCLEATION OF THE PROSTATE WITH MORCELLATION;  Surgeon: [name], MD;  Location: [facility] ORS;  Service: Urology;  Laterality: N/A;   HOLEP-LASER ENUCLEATION OF THE PROSTATE WITH MORCELLATION N/A 10/20/2020   Procedure: HOLEP-LASER ENUCLEATION OF THE PROSTATE WITH MORCELLATION;  Surgeon: [name], MD;  Location: [facility] ORS;  Service: Urology;  Laterality: N/A;   HPI:  Per H&P "Randy [name] is a 87 y.o. male with medical history significant for Lewy body dementia, parkinsonism followed by neurology, osteoarthritis, prostate cancer, essential hypertension, brought in by family who reports a decline in mobility since a fall on 7/18.  Patient did not hit his head and had no loss of consciousness and did not appear to suffer any apparent injury but seems to have difficulty ambulating.  He appears to have pain in his left hip and has not been participating with his self-care as of his baseline.  Since the fall he also appears to have worsening of his baseline mentation and slurred speech.  Patient presented to the ED the day prior, and was evaluated with a head CT that showed no acute intracranial findings.  They left  without being seen due to prolonged ED wait times but returned today due to continued symptoms.  ED course and data review: Afebrile, pulse 70, respirations 18, BP 171/93 with O2 sat of 100% on room air.  Labs: CBC normal except for hemoglobin of 12.2.  CMP for the most part unremarkable, troponin 9.  Urinalysis  sterile.  X-ray left hip showing severe degenerative changes but no recent fracture or dislocation  CT hip confirms no acute fracture or dislocation, severe left hip osteoarthritis and chronic healed fracture of the left superior and inferior pubic rami." MRI Head, 07/28/21, "Truncated examination. No acute ischemia."   Assessment / Plan / Recommendation Clinical Impression  Pt seen for speech/language evaluation. Pt alert, pleasant, and cooperative. Upright in recliner. Daughter present. Cleared with RN.  Daughter provided case history for pt as pt with documented cognitive deficits in setting of Lewy Body Dementia and Parkinson's Disease. Per daughter, pt resides with wife and daughter. Daughter works. Pt with hired caregiver during the day to assist with ADLs, IADLs. Daughter reports fluctuations in cognition day-to-day; however, pt "a lot worse" a few days leading up to admission. Daughter also reports pt with "sundowning" behaviors daily PTA.  Pt assessed via informal means. Pt A&Ox2 (person, place). Pt with reduced short term memory (documented hx; daughter report) and reduced problem solving/safety awareness (per daughter report). Basic functional receptive/expressive communication (auditory comprehension and verbal expression) are WFL. Speech is without s/sx dysarthria. Conversationally intact. Extra time needed during confrontation naming. Daughter reports he is nearing his baseline at this moment for cognitive-linguistic functioning.   Pt and daughter made aware of results, recommendations, basic orientation strategies, and SLP POC. Pt nodding in agreement, ?full understanding. Daughter verbalized understanding/agreement.  RN also made aware of the above.   Given fluctuations in cognitive-linguistic status PTA and the setting of progressive disease (Lewy Body Dementia, Parkinson's Disease), recommend post-acute SLP services for cognitive-linguistic evaluation in functional/home setting.  Anticipate need for frequent/constant supervision/assistance.       SLP Assessment  SLP Recommendation/Assessment: All further Speech Lanaguage Pathology  needs can be addressed in the next venue of care SLP Visit Diagnosis: Cognitive communication deficit (R41.841)    Recommendations for follow up therapy are one component of a multi-disciplinary discharge planning process, led by the attending physician.  Recommendations may be updated based on patient status, additional functional criteria and insurance authorization.    Follow Up Recommendations  Skilled nursing-short term rehab (<3 hours/day)    Assistance Recommended at Discharge  Frequent or constant Supervision/Assistance  Functional Status Assessment Patient has had a recent decline in their functional status and/or demonstrates limited ability to make significant improvements in function in a reasonable and predictable amount of time  Frequency and Duration           SLP Evaluation Cognition  Overall Cognitive Status: History of cognitive impairments - at baseline Arousal/Alertness: Awake/alert Orientation Level: Oriented to person;Oriented to place Memory: Impaired Memory Impairment: Storage deficit;Retrieval deficit;Decreased recall of new information;Decreased short term memory (per daughter) Problem Solving: Impaired Problem Solving Impairment: Functional basic Safety/Judgment: Impaired (per daughter, pt attempts to ambulate without assistance)       Comprehension  Auditory Comprehension Yes/No Questions: Within Functional Limits Commands: Within Functional Limits (1-2 step basic) Visual Recognition/Discrimination Discrimination: Not tested Reading Comprehension Reading Status: Not tested    Expression Expression Primary Mode of Expression: Verbal Verbal Expression Initiation: No impairment Automatic Speech: Social Response (WFL) Level of Generative/Spontaneous Verbalization: Phrase;Sentence Naming: No  impairment (5/5  objects with extra time) Pragmatics:  (no obvious impairment)   Oral / Motor  Oral Motor/Sensory Function Overall Oral Motor/Sensory Function: Within functional limits Motor Speech Overall Motor Speech: Appears within functional limits for tasks assessed Respiration: Within functional limits Phonation: Normal Resonance: Within functional limits Articulation: Within functional limitis Intelligibility: Intelligible Motor Planning: Witnin functional limits           Cherrie Gauze, M.S., [facility] [phone] [place])  Quintella Baton 07/29/2021, 9:43 AM

## 2021-07-29 NOTE — TOC Initial Note (Signed)
Transition of Care [facility]) - Initial/Assessment Note    Patient Details  Name: Randy [name] MRN: [id number] Date of Birth: [birth year]  Transition of Care [facility]) CM/SW Contact:    Valente David, RN Phone Number: 07/29/2021, 3:04 PM  Clinical Narrative:                 Patient admitted from home where he lives with daughter and wife.  Spoke with daughter [name], state she is concerned about ability to manage patient at home as he has not been able to walk, difficult to get out of the home to appointments.  Advised that per PT, SNF for short term rehab has been recommended.  Discussed preference for facilities, she does not have any.  FL2 completed, TOC will keep daughter updated on pending bed offers.   Expected Discharge Plan: Skilled Nursing Facility Barriers to Discharge: Continued Medical Work up   Patient Goals and CMS Choice Patient states their goals for this hospitalization and ongoing recovery are:: Per daughter, go to SNF for rehab and eventually return home when stronger CMS Medicare.gov Compare Post Acute Care list provided to:: Patient Represenative (must comment) (Daughter) Choice offered to / list presented to : Adult Children  Expected Discharge Plan and Services Expected Discharge Plan: [place] Acute Care Choice: [place] arrangements for the past 2 months: Single Family Home                                      Prior Living Arrangements/Services Living arrangements for the past 2 months: Single Family Home Lives with:: Adult Children, Spouse Patient language and need for interpreter reviewed:: No        Need for Family Participation in Patient Care: Yes (Comment) Care giver support system in place?: Yes (comment)   Criminal Activity/Legal Involvement Pertinent to Current Situation/Hospitalization: No - Comment as needed  Activities of Daily Living Home Assistive Devices/Equipment: Gilford Rile (specify  type) ADL Screening (condition at time of admission) Patient's cognitive ability adequate to safely complete daily activities?: No Is the patient deaf or have difficulty hearing?: Yes Does the patient have difficulty seeing, even when wearing glasses/contacts?: No Does the patient have difficulty concentrating, remembering, or making decisions?: Yes Patient able to express need for assistance with ADLs?: Yes Does the patient have difficulty dressing or bathing?: Yes Independently performs ADLs?: No Does the patient have difficulty walking or climbing stairs?: Yes Weakness of Legs: Both Weakness of Arms/Hands: None  Permission Sought/Granted Permission sought to share information with : Facility Art therapist granted to share information with : Yes, Verbal Permission Granted (from daughter)     Permission granted to share info w AGENCY: SNFs        Emotional Assessment       Orientation: : Oriented to Self, Oriented to Place   Psych Involvement: No (comment)  Admission diagnosis:  Ambulatory dysfunction [R26.2] Altered mental status, unspecified altered mental status type [R41.82] Patient Active Problem List   Diagnosis Date Noted   Ambulatory dysfunction 07/28/2021   Hypertensive urgency 07/28/2021   History of prostate cancer 07/28/2021   Parkinsonism ([place]) 07/28/2021   Altered mental status 07/28/2021   Swelling of limb 12/26/2020   Bilateral primary osteoarthritis of knee 02/15/2020   Pressure injury of buttock, stage 1    Delirium    COVID-19 virus infection 10/08/2019   Unable  to care for self 10/08/2019   Caregiver unable to cope 10/08/2019   Recurrent falls 10/08/2019   Lewy body dementia without behavioral disturbance [facility])    Prostate cancer ([place]) 06/30/2019   BPH with urinary obstruction 11/06/2018   Acute cholecystitis 09/08/2018   Myalgia due to statin 02/24/2018   Tricompartment osteoarthritis of left knee 11/14/2017   Generalized  weakness 04/28/2017   Chronic low back pain 03/12/2017   Knee pain, chronic 10/01/2016   Chronic venous insufficiency 08/30/2016   BPH without obstruction/lower urinary tract symptoms 03/06/2016   Osteoarthritis of multiple joints 03/06/2016   Degenerative joint disease (DJD) of lumbar spine 03/06/2016   Essential hypertension 03/05/2016   Hyperlipidemia 03/05/2016   Osteoporosis 03/05/2016   GERD (gastroesophageal reflux disease) 03/05/2016   Bilateral lower extremity edema 03/05/2016   Dupuytren's disease of palm 05/28/2015   PCP:  [name], DO Pharmacy:   [place] [place] [address] Phone: [phone] Fax: [phone]  [id number] [place] [address] [id number]: [phone]: [phone]    Social Determinants of Health (SDOH) Interventions    Readmission Risk Interventions    10/11/2019    9:10 AM  Readmission Risk Prevention Plan  Transportation Screening Complete  PCP or Specialist Appt within 3-5 Days Complete  [place]  Social Work Consult for [place]/Counseling Complete  Palliative Care Screening Not Applicable  Medication Review (Press photographer Complete

## 2021-07-29 NOTE — Evaluation (Signed)
Physical Therapy Evaluation Patient Details Name: Randy [name] MRN: [id number] DOB: [birth year] Today's Date: 07/29/2021  History of Present Illness  Randy [name] is a 87 y.o. male with medical history significant for Lewy body dementia, parkinsonism followed by neurology, osteoarthritis, prostate cancer, essential hypertension, brought in by family who reports a decline in mobility since a fall on 7/18.  Patient did not hit his head and had no loss of consciousness and did not appear to suffer any apparent injury but seems to have difficulty ambulating.  He appears to have pain in his left hip and has not been participating with his self-care as of his baseline.  Since the fall he also appears to have worsening of his baseline mentation and slurred speech.  Patient presented to the ED the day prior, and was evaluated with a head CT that showed no acute intracranial findings. They left without being seen due to prolonged ED wait times but returned today due to continued symptoms.  MRI negative, L hip xray reveals no acute findings.   Clinical Impression  Patient received in bed, states he just woke up. Lunch try left where he could not reach it. Patient required min assist to  perform bed mobility. Required max assist for sit to stand from elevated bed. He is able to take a few steps from bed to recliner with RW and min A. Patient will continue to benefit from skilled PT while here to improve strength and functional independence.        Recommendations for follow up therapy are one component of a multi-disciplinary discharge planning process, led by the attending physician.  Recommendations may be updated based on patient status, additional functional criteria and insurance authorization.  Follow Up Recommendations Skilled nursing-short term rehab (<3 hours/day) Can patient physically be transported by private vehicle: No    Assistance Recommended at Discharge Frequent or constant  Supervision/Assistance  Patient can return home with the following  A lot of help with walking and/or transfers;A lot of help with bathing/dressing/bathroom;Help with stairs or ramp for entrance;Direct supervision/assist for medications management;Assist for transportation    Equipment Recommendations None recommended by PT  Recommendations for Other Services       Functional Status Assessment Patient has had a recent decline in their functional status and demonstrates the ability to make significant improvements in function in a reasonable and predictable amount of time.     Precautions / Restrictions Precautions Precautions: Fall Restrictions Weight Bearing Restrictions: No      Mobility  Bed Mobility Overal bed mobility: Needs Assistance             General bed mobility comments: min assist to get scooted out to edge of bed    Transfers Overall transfer level: Needs assistance Equipment used: Rolling walker (2 wheels) Transfers: Sit to/from Stand, Bed to chair/wheelchair/BSC Sit to Stand: Max assist, From elevated surface   Step pivot transfers: Min assist       General transfer comment: Patient required max assist to stand. Cues to leand forward. Cues for hand placement. moderately elevated bed to achieve standing    Ambulation/Gait Ambulation/Gait assistance: Min assist Gait Distance (Feet): 3 Feet Assistive device: Rolling walker (2 wheels) Gait Pattern/deviations: Step-to pattern, Decreased step length - right, Decreased step length - left Gait velocity: decr     General Gait Details: patient was able to take steps from bed to recliner.  Stairs            Emergency planning/management officer  Modified Rankin (Stroke Patients Only)       Balance Overall balance assessment: Needs assistance, History of Falls Sitting-balance support: Feet supported Sitting balance-Leahy Scale: Good     Standing balance support: Bilateral upper extremity supported, During  functional activity, Reliant on assistive device for balance Standing balance-Leahy Scale: Poor                               Pertinent Vitals/Pain Pain Assessment Pain Assessment: No/denies pain    Home Living Family/patient expects to be discharged to:: Private residence Living Arrangements: Children;Spouse/significant other Available Help at Discharge: Family;Available 24 hours/day Type of Home: House             Additional Comments: Unusre, patient with no family in room.    Prior Function Prior Level of Function : Needs assist;History of Falls (last six months)                     Hand Dominance        Extremity/Trunk Assessment   Upper Extremity Assessment Upper Extremity Assessment: Generalized weakness    Lower Extremity Assessment Lower Extremity Assessment: Generalized weakness    Cervical / Trunk Assessment Cervical / Trunk Assessment: Normal  Communication   Communication: Expressive difficulties;Other (comment) (word finding)  Cognition Arousal/Alertness: Awake/alert Behavior During Therapy: WFL for tasks assessed/performed Overall Cognitive Status: History of cognitive impairments - at baseline                                          General Comments      Exercises     Assessment/Plan    PT Assessment Patient needs continued PT services  PT Problem List Decreased strength;Decreased mobility;Decreased activity tolerance;Decreased balance;Decreased knowledge of use of DME;Decreased coordination;Decreased cognition;Decreased safety awareness       PT Treatment Interventions DME instruction;Therapeutic exercise;Gait training;Balance training;Functional mobility training;Therapeutic activities;Patient/family education;Neuromuscular re-education    PT Goals (Current goals can be found in the Care Plan section)  Acute Rehab PT Goals Patient Stated Goal: none stated PT Goal Formulation: Patient unable to  participate in goal setting Time For Goal Achievement: 08/06/21    Frequency Min 2X/week     Co-evaluation               AM-PAC PT "6 Clicks" Mobility  Outcome Measure Help needed turning from your back to your side while in a flat bed without using bedrails?: A Little Help needed moving from lying on your back to sitting on the side of a flat bed without using bedrails?: A Little Help needed moving to and from a bed to a chair (including a wheelchair)?: A Little Help needed standing up from a chair using your arms (e.g., wheelchair or bedside chair)?: A Lot Help needed to walk in hospital room?: A Lot Help needed climbing 3-5 steps with a railing? : Total 6 Click Score: 14    End of Session Equipment Utilized During Treatment: Gait belt Activity Tolerance: Patient tolerated treatment well Patient left: in chair;with call bell/phone within reach;with chair alarm set Nurse Communication: Mobility status PT Visit Diagnosis: Other abnormalities of gait and mobility (R26.89);Muscle weakness (generalized) (M62.81);History of falling (Z91.81);Difficulty in walking, not elsewhere classified (R26.2)    Time: 3154-0086 PT Time Calculation (min) (ACUTE ONLY): 17 min   Charges:   PT Evaluation $  PT Eval Moderate Complexity: 1 Mod          Sharnay Cashion, PT, GCS 07/29/21,2:12 PM

## 2021-07-29 NOTE — Progress Notes (Signed)
PT Cancellation Note  Patient Details Name: Randy [name] MRN: [id number] DOB: [birth year]   Cancelled Treatment:    Reason Eval/Treat Not Completed: Patient at procedure or test/unavailable. Patient left floor for Xray. Will re-attempt later today.   Shabazz Mckey 07/29/2021, 10:22 AM

## 2021-07-29 NOTE — Evaluation (Signed)
Occupational Therapy Evaluation Patient Details Name: Randy [name] MRN: [id number] DOB: 1934-07-17 Today's Date: 07/29/2021   History of Present Illness Randy [name] is a 87 y.o. male with medical history significant for Lewy body dementia, parkinsonism followed by neurology, osteoarthritis, prostate cancer, essential hypertension, brought in by family who reports a decline in mobility since a fall on 7/18.  Patient did not hit his head and had no loss of consciousness and did not appear to suffer any apparent injury but seems to have difficulty ambulating.  He appears to have pain in his left hip and has not been participating with his self-care as of his baseline.  Since the fall he also appears to have worsening of his baseline mentation and slurred speech.  Patient presented to the ED the day prior, and was evaluated with a head CT that showed no acute intracranial findings. They left without being seen due to prolonged ED wait times but returned today due to continued symptoms.  MRI negative, L hip xray reveals no acute findings.   Clinical Impression   [name] presents to OT with generalized weakness, impaired balance, and impaired cognition that impacts his engagement in self care tasks.  Prior to admission, patient lived with his wife and daughter who provided assist for IADLs.  He was able to complete most ADLs with mod I using 4WW.  Currently, patient requires intermittent min A for seated UB ADLs due to BUE tremor, and max assist for transfers with rolling walker.  Anticipate max level of assist needed for lower body dressing and bathing.  [name] will likely continue to benefit from skilled OT services in acute setting to support functional strengthening, cognition, balance, and safety and independence in ADLs.  Recommend STR upon discharge to further address these goals.      Recommendations for follow up therapy are one component of a multi-disciplinary discharge planning process, led by  the attending physician.  Recommendations may be updated based on patient status, additional functional criteria and insurance authorization.   Follow Up Recommendations  Skilled nursing-short term rehab (<3 hours/day)    Assistance Recommended at Discharge Frequent or constant Supervision/Assistance  Patient can return home with the following A lot of help with bathing/dressing/bathroom;A lot of help with walking and/or transfers;Assistance with cooking/housework;Direct supervision/assist for medications management;Assist for transportation;Help with stairs or ramp for entrance    Functional Status Assessment  Patient has had a recent decline in their functional status and demonstrates the ability to make significant improvements in function in a reasonable and predictable amount of time.  Equipment Recommendations   (defer to next level of care)    Recommendations for Other Services       Precautions / Restrictions Precautions Precautions: Fall Restrictions Weight Bearing Restrictions: No      Mobility Bed Mobility Overal bed mobility: Needs Assistance                  Transfers Overall transfer level: Needs assistance Equipment used: Rolling walker (2 wheels) Transfers: Sit to/from Stand, Bed to chair/wheelchair/BSC Sit to Stand: Max assist, From elevated surface                  Balance Overall balance assessment: Needs assistance, History of Falls Sitting-balance support: Feet supported Sitting balance-Leahy Scale: Good  ADL either performed or assessed with clinical judgement   ADL Overall ADL's : Needs assistance/impaired Eating/Feeding: Sitting;Minimal assistance Eating/Feeding Details (indicate cue type and reason): intermittent min A 2/2 BUE tremors Grooming: Minimal assistance;Sitting Grooming Details (indicate cue type and reason): intermittent min A 2/2 BUE tremors Upper Body Bathing: Minimal  assistance;Sitting Upper Body Bathing Details (indicate cue type and reason): intermittent min A 2/2 BUE tremors Lower Body Bathing: Maximal assistance;Sit to/from stand Lower Body Bathing Details (indicate cue type and reason): requires max A for standing Upper Body Dressing : Minimal assistance Upper Body Dressing Details (indicate cue type and reason): intermittent min A 2/2 BUE tremors Lower Body Dressing: Maximal assistance;Sit to/from stand                 General ADL Comments: Able to perform seated UB ADLs with intermittent min A, requires increasd level of assist for LB bathing/dressing and toileting due to level of assist needed for transfers     Vision Patient Visual Report: No change from baseline       Perception     Praxis      Pertinent Vitals/Pain Pain Assessment Pain Assessment: No/denies pain     Hand Dominance Right   Extremity/Trunk Assessment Upper Extremity Assessment Upper Extremity Assessment: Generalized weakness   Lower Extremity Assessment Lower Extremity Assessment: Generalized weakness   Cervical / Trunk Assessment Cervical / Trunk Assessment: Normal   Communication Communication Communication: Expressive difficulties;Other (comment) (word finding)   Cognition Arousal/Alertness: Awake/alert Behavior During Therapy: WFL for tasks assessed/performed Overall Cognitive Status: History of cognitive impairments - at baseline                                       General Comments       Exercises Other Exercises Other Exercises: provided education re: OT role and plan of care, fall and safety precautions, discharge recommendations, setup assist for seated grooming   Shoulder Instructions      Home Living Family/patient expects to be discharged to:: Private residence Living Arrangements: Children;Spouse/significant other Available Help at Discharge: Family;Available 24 hours/day (pt's daughter works during day) Type  of Home: House Home Access: Stairs to enter Technical brewer of Steps: 7 Entrance Stairs-Rails: Right Home Layout: One level     Bathroom Shower/Tub: Chief Strategy Officer: Rollator (4 wheels);Rolling Walker (2 wheels);Grab bars - tub/shower;Toilet riser;Shower seat - built in   Additional Comments: would benefit from clarification, patient's speech is impaired  Lives With: Spouse;Daughter    Prior Functioning/Environment Prior Level of Function : Needs assist;History of Falls (last six months)       Physical Assist : ADLs (physical)   ADLs (physical): Bathing;IADLs Mobility Comments: Mod I with [id number] ADLs Comments: Limited assist needed for ADLs (primarily bathing).  Pt's wife and daughter provide assist for household management, medications, transportation.        OT Problem List: Decreased strength;Decreased range of motion;Decreased activity tolerance;Impaired balance (sitting and/or standing);Decreased coordination;Decreased cognition;Decreased knowledge of use of DME or AE;Impaired UE functional use      OT Treatment/Interventions: Self-care/ADL training;Therapeutic exercise;DME and/or AE instruction;Therapeutic activities;Cognitive remediation/compensation;Patient/family education;Balance training    OT Goals(Current goals can be found in the care plan section) Acute Rehab OT Goals Patient Stated Goal: to be independent with self care OT Goal Formulation: With patient Time For Goal Achievement: 08/12/21 Potential to Achieve  Goals: Fair  OT Frequency: Min 2X/week    Co-evaluation              AM-PAC OT "6 Clicks" Daily Activity     Outcome Measure Help from another person eating meals?: A Little Help from another person taking care of personal grooming?: A Little Help from another person toileting, which includes using toliet, bedpan, or urinal?: A Lot Help from another person bathing (including washing, rinsing, drying)?: A Lot Help  from another person to put on and taking off regular upper body clothing?: A Little Help from another person to put on and taking off regular lower body clothing?: A Lot 6 Click Score: 15   End of Session    Activity Tolerance: Patient tolerated treatment well Patient left: in chair;with call bell/phone within reach  OT Visit Diagnosis: Muscle weakness (generalized) (M62.81);Other symptoms and signs involving cognitive function                Time: 1424-1449 OT Time Calculation (min): 25 min Charges:  OT General Charges $OT Visit: 1 Visit OT Evaluation $OT Eval Moderate Complexity: 1 Mod OT Treatments $Self Care/Home Management : 8-22 mins  Jeneen Montgomery, OTR/L 07/29/21, 3:54 PM

## 2021-07-30 DIAGNOSIS — R4182 Altered mental status, unspecified: Secondary | ICD-10-CM

## 2021-07-30 DIAGNOSIS — R262 Difficulty in walking, not elsewhere classified: Secondary | ICD-10-CM | POA: Diagnosis not present

## 2021-07-30 LAB — COMPREHENSIVE METABOLIC PANEL
ALT: <5 U/L (ref 0–44)
AST: 16 U/L (ref 15–41)
Albumin: 3.2 g/dL — ABNORMAL LOW (ref 3.5–5.0)
Alkaline Phosphatase: 81 U/L (ref 38–126)
Anion gap: 8 (ref 5–15)
BUN: 24 mg/dL — ABNORMAL HIGH (ref 8–23)
CO2: 27 mmol/L (ref 22–32)
Calcium: 8.8 mg/dL — ABNORMAL LOW (ref 8.9–10.3)
Chloride: 106 mmol/L (ref 98–111)
Creatinine, Ser: 0.75 mg/dL (ref 0.61–1.24)
GFR, Estimated: >60 mL/min (ref >60)
Glucose, Bld: 105 mg/dL — ABNORMAL HIGH (ref 70–99)
Potassium: 3.4 mmol/L — ABNORMAL LOW (ref 3.5–5.1)
Sodium: 141 mmol/L (ref 135–145)
Total Bilirubin: 1 mg/dL (ref 0.3–1.2)
Total Protein: 6.2 g/dL — ABNORMAL LOW (ref 6.5–8.1)

## 2021-07-30 LAB — CBC
HCT: 35.2 % — ABNORMAL LOW (ref 39.0–52.0)
Hemoglobin: 11.7 g/dL — ABNORMAL LOW (ref 13.0–17.0)
MCH: 31 pg (ref 26.0–34.0)
MCHC: 33.2 g/dL (ref 30.0–36.0)
MCV: 93.4 fL (ref 80.0–100.0)
Platelets: 170 10*3/uL (ref 150–400)
RBC: 3.77 MIL/uL — ABNORMAL LOW (ref 4.22–5.81)
RDW: 13.2 % (ref 11.5–15.5)
WBC: 5.1 10*3/uL (ref 4.0–10.5)
nRBC: 0 % (ref 0.0–0.2)

## 2021-07-30 LAB — VITAMIN B12: Vitamin B-12: 257 pg/mL (ref 180–914)

## 2021-07-30 LAB — TSH: TSH: 0.914 u[IU]/mL (ref 0.350–4.500)

## 2021-07-30 MED ORDER — POTASSIUM CHLORIDE CRYS ER 20 MEQ PO TBCR
40.0000 meq | EXTENDED_RELEASE_TABLET | Freq: Once | ORAL | Status: AC
  Administered 2021-07-30: 40 meq via ORAL
  Filled 2021-07-30: qty 2

## 2021-07-30 MED ORDER — ENALAPRIL MALEATE 10 MG PO TABS
10.0000 mg | ORAL_TABLET | Freq: Every day | ORAL | Status: DC
  Administered 2021-07-30: 10 mg via ORAL
  Filled 2021-07-30 (×2): qty 1

## 2021-07-30 MED ORDER — VITAMIN B-12 1000 MCG PO TABS
1000.0000 ug | ORAL_TABLET | Freq: Every day | ORAL | Status: DC
  Administered 2021-07-30 – 2021-07-31 (×2): 1000 ug via ORAL
  Filled 2021-07-30 (×2): qty 1

## 2021-07-30 NOTE — TOC Progression Note (Signed)
Transition of Care [facility]) - Progression Note    Patient Details  Name: Randy [name] MRN: [id number] Date of Birth: 08/19/1934  Transition of [facility]) CM/SW Contact  Eileen Stanford, LCSW Phone Number: 07/30/2021, 2:45 PM  Clinical Narrative:   No bed offers at this time. Confirmed with pt's daughter [name] the goal is for pt to rehab and return home. CSW asked that [facility] and [facility] review referrals- awaiting responses.    Expected Discharge Plan: [place] Barriers to Discharge: Continued Medical Work up  Expected Discharge Plan and Services Expected Discharge Plan: [place] Choice: [place] arrangements for the past 2 months: Single Family Home                                       Social Determinants of Health (SDOH) Interventions    Readmission Risk Interventions    10/11/2019    9:10 AM  Readmission Risk Prevention Plan  Transportation Screening Complete  PCP or Specialist Appt within 3-5 Days Complete  HRI or [place] Complete  Social Work Consult for [place] Planning/Counseling Complete  Palliative Care Screening Not Applicable  Medication Review Press photographer) Complete

## 2021-07-30 NOTE — Procedures (Signed)
Patient Name: Randy [name]  MRN: [id number]  Epilepsy Attending: Lora Havens  Referring Physician/Provider: Derek Jack, MD Date: 07/30/2021 Duration: 25.51 mins  Patient history: 46 old male with altered mental status.  EEG to evaluate for seizure.  Level of alertness: Awake  AEDs during EEG study: None  Technical aspects: This EEG study was done with scalp electrodes positioned according to the 10-20 International system of electrode placement. Electrical activity was acquired at a sampling rate of '500Hz'$  and reviewed with a high frequency filter of '70Hz'$  and a low frequency filter of '1Hz'$ . EEG data were recorded continuously and digitally stored.   Description: The posterior dominant rhythm consists of 8-'9Hz'$  activity of moderate voltage (25-35 uV) seen predominantly in posterior head regions, symmetric and reactive to eye opening and eye closing. Hyperventilation and photic stimulation were not performed.     IMPRESSION: This study is within normal limits. No seizures or epileptiform discharges were seen throughout the recording.  Zarielle Cea Barbra Sarks

## 2021-07-30 NOTE — Consult Note (Signed)
NEUROLOGY CONSULTATION NOTE   Date of service: July 30, 2021 Patient Name: Randy [name] MRN:  [id number] DOB:  1934/05/09 Reason for consult: lewy body dementia, recent mild decline in mobility and cognition Requesting physician: Dr. Ulyess Blossom _ _ _   _ __   _ __ _ _  __ __   _ __   __ _  History of Present Illness   This is an 87 yo gentleman with hx lewy body dementia, HL, HTN who is admitted with decline in mobility since a fall on 7/18. Plain film and CT evaluation confirmed severe OA in L hip but no e/o fracture. He states that his hip has hurt more since the fall. Daughter was called for collateral information and she stated that additionally he has seemed more confused over the past 10 days with some word substitutions. His cognition appears to be fluctuating and he is fully oriented on my examination tonight although he continues to make paraphrasic errors. MRI brain was performed which he did not tolerate well 2/2 confusion but diffusion sequences showed no e/o acute ischemia (personal review). As an outpatient he is f/b Dr. Manuella Ghazi and is prescribed sinemet CR 50-'200mg'$  1 tablet qid and rivastigmine '3mg'$  bid.    ROS   Per HPI: all other systems reviewed and are negative  Past History   I have reviewed the following:  Past Medical History:  Diagnosis Date   Arthritis    GERD (gastroesophageal reflux disease)    Hyperlipidemia    Hypertension    Lewy body dementia ([place])    Osteoporosis    Prostate enlargement    Past Surgical History:  Procedure Laterality Date   CHOLECYSTECTOMY N/A 09/10/2018   Procedure: LAPAROSCOPIC CHOLECYSTECTOMY;  Surgeon: Benjamine Sprague, DO;  Location: [facility];  Service: General;  Laterality: N/A;   CYSTOSCOPY WITH LITHOLAPAXY N/A 10/20/2020   Procedure: CYSTOSCOPY WITH LITHOLAPAXY;  Surgeon: [name], MD;  Location: [facility];  Service: Urology;  Laterality: N/A;   GANGLION CYST EXCISION Left 2013   Left upper arm   HERNIA REPAIR N/A 2008    MIdline, ventral acquired hernia repair   HOLEP-LASER ENUCLEATION OF THE PROSTATE WITH MORCELLATION N/A 11/06/2018   Procedure: HOLEP-LASER ENUCLEATION OF THE PROSTATE WITH MORCELLATION;  Surgeon: [name], MD;  Location: [facility];  Service: Urology;  Laterality: N/A;   HOLEP-LASER ENUCLEATION OF THE PROSTATE WITH MORCELLATION N/A 10/20/2020   Procedure: HOLEP-LASER ENUCLEATION OF THE PROSTATE WITH MORCELLATION;  Surgeon: [name], MD;  Location: [facility];  Service: Urology;  Laterality: N/A;   Family History  Problem Relation Age of Onset   Breast cancer Mother    Lung cancer Father    Heart disease Brother    Leukemia Brother    Prostate cancer Neg Hx    Colon cancer Neg Hx    Bladder Cancer Neg Hx    Kidney cancer Neg Hx    Social History   Socioeconomic History   Marital status: Married    Spouse name: Not on file   Number of children: Not on file   Years of education: College   Highest education level: Not on file  Occupational History   Occupation: Retired Clinical biochemist  Tobacco Use   Smoking status: Former    Packs/day: 1.00    Years: 12.00    Total pack years: 12.00    Types: Cigarettes    Quit date: 1968    Years since quitting: 55.5   Smokeless  tobacco: Never  Vaping Use   Vaping Use: Never used  Substance and Sexual Activity   Alcohol use: No   Drug use: No   Sexual activity: Not Currently  Other Topics Concern   Not on file  Social History Narrative   Not on file   Social Determinants of Health   Financial Resource Strain: Low Risk  (10/09/2020)   Overall Financial Resource Strain (CARDIA)    Difficulty of Paying Living Expenses: Not hard at all  Food Insecurity: No Food Insecurity (10/09/2020)   Hunger Vital Sign    Worried About Running Out of Food in the Last Year: Never true    Ran Out of Food in the Last Year: Never true  Transportation Needs: No Transportation Needs (10/09/2020)   PRAPARE - Hydrologist  (Medical): No    Lack of Transportation (Non-Medical): No  Physical Activity: Inactive (07/25/2020)   Exercise Vital Sign    Days of Exercise per Week: 0 days    Minutes of Exercise per Session: 0 min  Stress: No Stress Concern Present (07/25/2020)   Ball of Stress : Not at all  Social Connections: [place] (10/09/2020)   Social Connection and Isolation Panel [NHANES]    Frequency of Communication with Friends and Family: More than three times a week    Frequency of Social Gatherings with Friends and Family: More than three times a week    Attends Religious Services: More than 4 times per year    Active Member of [employer] or Organizations: Yes    Attends Music therapist: More than 4 times per year    Marital Status: Married   Allergies  Allergen Reactions   Antihistamines, Chlorpheniramine-Type Other (See Comments)    Can't urinate    Medications   Medications Prior to Admission  Medication Sig Dispense Refill Last Dose   acetaminophen (TYLENOL) 500 MG tablet Take 1,000 mg by mouth every 6 (six) hours as needed for moderate pain.   07/28/2021 at am   carbidopa-levodopa (SINEMET CR) 50-200 MG tablet Take 1 tablet by mouth 4 (four) times daily.   07/28/2021 at am   cholecalciferol (VITAMIN D) 1000 units tablet Take 1,000 Units by mouth daily.   07/28/2021 at am   Coenzyme Q10 (CO Q 10) 100 MG CAPS Take 100 mg by mouth daily.    07/28/2021 at am   enalapril (VASOTEC) 5 MG tablet Take 1 tablet (5 mg total) by mouth daily. 90 tablet 3 07/28/2021 at am   furosemide (LASIX) 20 MG tablet Take 1 tablet (20 mg total) by mouth every other day. (Patient taking differently: Take 20 mg by mouth every other day. Patient takes Mondays, Wednesdays, and Fridays.) 45 tablet 0 07/27/2021 at am   Multiple Vitamin (MULTIVITAMIN WITH MINERALS) TABS tablet Take 1 tablet by mouth daily. 30 tablet 0 07/28/2021 at  am   Omega-3 Fatty Acids (FISH OIL) 1200 MG CAPS Take 1,200 mg by mouth in the morning, at noon, in the evening, and at bedtime.   07/28/2021 at am   omeprazole (PRILOSEC) 20 MG capsule Take 20 mg by mouth 2 (two) times daily.   07/28/2021 at am   rivastigmine (EXELON) 3 MG capsule Take 3 mg by mouth 2 (two) times daily.   07/28/2021 at am   sertraline (ZOLOFT) 50 MG tablet Take 50 mg by mouth daily.   07/28/2021 at am  vitamin C (ASCORBIC ACID) 500 MG tablet Take 500 mg by mouth daily.   07/28/2021 at am   diclofenac Sodium (VOLTAREN) 1 % GEL APPLY 2 G TOPICALLY 4 (FOUR) TIMES DAILY AS NEEDED. TO KNEE ARTHRITIS PAIN (Patient not taking: Reported on 07/28/2021) 100 g 3 Completed Course   fluticasone (FLONASE) 50 MCG/ACT nasal spray Place 2 sprays into both nostrils as needed for allergies.      melatonin 3 MG TABS tablet Take 3 mg by mouth at bedtime.      traMADol (ULTRAM) 50 MG tablet Take 1 tablet (50 mg total) by mouth every 8 (eight) hours as needed. 30 tablet 2    zinc sulfate 220 (50 Zn) MG capsule Take 1 capsule (220 mg total) by mouth daily. (Patient not taking: Reported on 07/28/2021) 30 capsule 0 Completed Course      Current Facility-Administered Medications:    acetaminophen (TYLENOL) tablet 650 mg, 650 mg, Oral, Q4H PRN **OR** acetaminophen (TYLENOL) 160 MG/5ML solution 650 mg, 650 mg, Per Tube, Q4H PRN **OR** acetaminophen (TYLENOL) suppository 650 mg, 650 mg, Rectal, Q4H PRN, Athena Masse, MD   carbidopa-levodopa (SINEMET CR) 50-200 MG per tablet controlled release 1 tablet, 1 tablet, Oral, QID, Athena Masse, MD, 1 tablet at 07/29/21 2129   enalapril (VASOTEC) tablet 10 mg, 10 mg, Oral, Daily, Lorella Nimrod, MD   enoxaparin (LOVENOX) injection 40 mg, 40 mg, Subcutaneous, Q24H, Judd Gaudier V, MD, 40 mg at 07/29/21 2129   furosemide (LASIX) tablet 20 mg, 20 mg, Oral, QODAY, Athena Masse, MD, 20 mg at 07/29/21 0926   LORazepam (ATIVAN) tablet 0.5 mg, 0.5 mg, Oral, Q4H PRN,  Merlyn Lot, MD, 0.5 mg at 07/29/21 2347   melatonin tablet 5 mg, 5 mg, Oral, QHS, Judd Gaudier V, MD, 5 mg at 07/29/21 2129   potassium chloride SA (KLOR-CON M) CR tablet 40 mEq, 40 mEq, Oral, Once, Lorella Nimrod, MD   rivastigmine (EXELON) capsule 3 mg, 3 mg, Oral, BID, Judd Gaudier V, MD, 3 mg at 07/29/21 2129   sertraline (ZOLOFT) tablet 50 mg, 50 mg, Oral, QHS, Judd Gaudier V, MD, 50 mg at 07/29/21 2129  Vitals   Vitals:   07/29/21 1602 07/29/21 1932 07/30/21 0343 07/30/21 0753  BP: 97/78 138/87 (!) 154/93 (!) 169/95  Pulse: 90 75 78 67  Resp: '20 18 16 18  '$ Temp: 98.6 F (37 C) 98 F (36.7 C) 97.9 F (36.6 C) 97.7 F (36.5 C)  TempSrc:  Oral Oral   SpO2: 95% 97% 92% 96%  Weight:      Height:         Body mass index is 29.84 kg/m.  Physical Exam   Physical Exam Gen: A&O x3. Follows simple commands Resp: CTAB, no w/r/r CV: RRR, no m/g/r; nml S1 and S2. 2+ symmetric peripheral pulses.  Neuro: *MS: A&O x3. Follows simple commands *Speech: fluid with frequent paraphrasic errors, able to name and repeat *CN: PERRL, VFF by confrontation, EOMI, sensation intact, face symmetric at rest, hearing intact to voice *Motor:   Normal bulk.  Strength 4/5 diffusely, symmetric. Mild resting tremor RUE. Mild cogwheeling rigidity RUE.  *Sensory: SILT. *Coordination:  mild dysdiadochokinesia on RAM bilat, no ataxia on FNF *Reflexes:  2+ and symmetric throughout without clonus; toes down-going bilat *Gait: deferred   Labs   CBC:  Recent Labs  Lab 07/28/21 1859 07/28/21 2319 07/30/21 0413  WBC 6.6 6.1 5.1  NEUTROABS 4.3  --   --   HGB  12.2* 11.6* 11.7*  HCT 37.8* 35.3* 35.2*  MCV 94.3 92.9 93.4  PLT 185 175 814    Basic Metabolic Panel:  Lab Results  Component Value Date   NA 141 07/30/2021   K 3.4 (L) 07/30/2021   CO2 27 07/30/2021   GLUCOSE 105 (H) 07/30/2021   BUN 24 (H) 07/30/2021   CREATININE 0.75 07/30/2021   CALCIUM 8.8 (L) 07/30/2021   GFRNONAA >60  07/30/2021   GFRAA >60 10/12/2019   Lipid Panel:  Lab Results  Component Value Date   LDLCALC 102 (H) 07/29/2021   HgbA1c:  Lab Results  Component Value Date   HGBA1C 5.3 07/28/2021   Urine Drug Screen: No results found for: "LABOPIA", "COCAINSCRNUR", "LABBENZ", "AMPHETMU", "THCU", "LABBARB"  Alcohol Level No results found for: "ETH"   Impression   This is an 87 yo gentleman with hx lewy body dementia, HL, HTN who is admitted with decline in mobility since a fall on 7/18. I suspect his mobility issues are 2/2 left hip pain and mild progression of his parkinsonism. I suspect the fluctuating cognitive impairment is secondary again to progression of disease coupled with the fluctuations expected in lewy body dementia. His motor parkinsonian sx are reasonably controlled with his current medication regimen. I had a long talk with daughter who had requested neurology consult just to make sure they were on the right track with current outpatient mgmt and I assured her that I was in agreement with it. I did order a B12 level and EEG, but otherwise no further inpatient neurologic workup indicated.  Recommendations   - Check B12 level - rEEG - Patient has ativan 0.'5mg'$  prn prescribed for agitation; will d/c this 2/2 concern for delirium - Neurology will f/u the B12 level and rEEG tomorrow. Otherwise no further inpatient neurologic workup indicated. No change recommended in sinemet or rivastigmine. Patient may f/u with established outpatient neurologist Dr. Manuella Ghazi. ______________________________________________________________________   Thank you for the opportunity to take part in the care of this patient. If you have any further questions, please contact the neurology consultation attending.  Signed,  Su Monks, MD [facility] [phone]  If 7pm- 7am, please page neurology on call as listed in [place].

## 2021-07-30 NOTE — Progress Notes (Signed)
Eeg done 

## 2021-07-30 NOTE — Assessment & Plan Note (Signed)
Patient with history of Parkinson's and Lewy body dementia. -Continue current management -Continue outpatient neurology follow-up

## 2021-07-30 NOTE — Progress Notes (Signed)
Vitamin B12: 257 I would recommend supplementing to keep levels over 400.  EEG pending-will follow results with you-please do not hesitate to call back with questions for  -- Amie Portland, MD Neurologist [facility] Pager: [phone]

## 2021-07-30 NOTE — Progress Notes (Signed)
Progress Note   Patient: Randy [name] NWG:[id number] DOB: 29-Aug-1934 DOA: 07/28/2021     2 DOS: the patient was seen and examined on 07/30/2021   Brief hospital course:  Randy [name] is a 87 y.o. male with medical history significant for Lewy body dementia, parkinsonism followed by neurology, osteoarthritis, prostate cancer, essential hypertension, brought in by family who reports a decline in mobility since a fall on 7/18.  Patient did not hit his head and had no loss of consciousness and did not appear to suffer any apparent injury but seems to have difficulty ambulating.  He appears to have pain in his left hip and has not been participating with his self-care as of his baseline.  Since the fall he also appears to have worsening of his baseline mentation and slurred speech.  Admitted the following day for AMS and difficulty ambulating.    He appears to have pain in his left hip and has not been participating with his self-care as of his baseline.  Since the fall he also appears to have worsening of his baseline mentation and slurred speech.  Patient presented to the ED the day prior, and was evaluated with a head CT that showed no acute intracranial findings.  They left without being seen due to prolonged ED wait times but returned today due to continued symptoms. ED course and data review: Afebrile, pulse 70, respirations 18, BP 171/93 with O2 sat of 100% on room air. Labs: CBC normal except for hemoglobin of 12.2.  CMP for the most part unremarkable, troponin 9.  Urinalysis sterile. X-ray left hip showing severe degenerative changes but no recent fracture or dislocation CT hip confirms no acute fracture or dislocation, severe left hip osteoarthritis and chronic healed fracture of the left superior and inferior pubic rami.  MRI brain normal.   Neurology was consulted and according to the recommendation it looks like having some advancement in his parkinsonism and Lewy body dementia.  Per neurology  he is on the right track of medications and no changes were made.  Due to difficulty in ambulation PT and OT evaluation was obtained and they recommended rehab.  7/24: Patient remained stable, close to baseline and waiting for rehab placement. B12 levels at 257, neurology recommending starting supplement to keep the levels above 400. B12 supplement was started.   Assessment and Plan: * Ambulatory dysfunction Left hip pain secondary to recent fall, history of falls Severe degenerative joint disease Suspect multifactorial and related to pain from recent fall, severe DJD, Lower suspicion for acute CVA, MRI brain was negative.  CT hip was negative for any acute fractures. Continue Parkinson meds Pain control Fall precautions PT eval-recommending rehab. Medically stable for discharge  Altered mental status Family reported altered mental status from baseline since fall on 7/18 Improving.  CT head and MRI was without any acute abnormality.  Neurology was consulted and they suggested checking B12 which was 257 so he was started on a supplement to keep the levels above 400 per neurology. Most likely advancement in his Parkinson's and Lewy body dementia. -Continue to monitor   Unable to care for self Caregiver unable to cope PT and TOC evaluation-recommending rehab Awaiting placement  Parkinsonism [facility]) Patient with history of Parkinson's and Lewy body dementia. -Continue current management -Continue outpatient neurology follow-up  Lewy body dementia without behavioral disturbance ([place]) Parkinsonism Last seen by neurology in May 2023 Continue carbidopa-levodopa, rivastigmine and Zoloft Delirium precautions - Minimize/avoid deliriogenic meds including: anticholinergic, opiates, benzodiazepines           -  Maintain hydration, oxygenation, nutrition           - Limit use of restraints and catheters           - Normalize sleep patterns by minimizing nighttime noise, light and  interruptions by                -Ensure sleep apnea treatment is provided overnight.             clustering care, opening blinds during the day           - Reorient the patient frequently, provide easily visible clock and calendar           - Provide sensory aids like glasses, hearing aids           - Encourage ambulation, regular activities and visitors to maintain cognitive stimulation              -Patient would benefit from having family members at bedside to reinforce his orientation. Haldol prn agitation   Hypertensive urgency Blood pressure remained elevated. -Increase the dose of enalapril to 10 mg daily.  Currently on Lasix for unclear reason but will continue  Osteoarthritis of multiple joints Continue acetaminophen and tramadol  History of prostate cancer Followed annually by urology and was last seen a couple months ago on chart review     Subjective: Patient was seen and examined today.  No new complaints.  Oriented to self and person only.  Daughter at bedside.  Physical Exam: Vitals:   07/29/21 1932 07/30/21 0343 07/30/21 0753 07/30/21 1152  BP: 138/87 (!) 154/93 (!) 169/95 133/84  Pulse: 75 78 67 73  Resp: '18 16 18   '$ Temp: 98 F (36.7 C) 97.9 F (36.6 C) 97.7 F (36.5 C) 98.5 F (36.9 C)  TempSrc: Oral Oral    SpO2: 97% 92% 96% 92%  Weight:      Height:       General.  Pleasant elderly man, in no acute distress. Pulmonary.  Lungs clear bilaterally, normal respiratory effort. CV.  Regular rate and rhythm, no JVD, rub or murmur. Abdomen.  Soft, nontender, nondistended, BS positive. CNS.  Alert and oriented to self.  No focal neurologic deficit. Extremities.  No edema, no cyanosis, pulses intact and symmetrical. Psychiatry.  Judgment and insight appears impaired  Data Reviewed: Prior data reviewed  Family Communication: Discussed with daughter at bedside  Disposition: Status is: Inpatient Remains inpatient appropriate because: Severity of illness,  currently stable awaiting SNF placement.   Planned Discharge Destination: Skilled nursing facility  DVT prophylaxis.  Lovenox  Time spent: 45 minutes  This record has been created using Systems analyst. Errors have been sought and corrected,but may not always be located. Such creation errors do not reflect on the standard of care.  Author: Lorella Nimrod, MD 07/30/2021 2:16 PM  For on call review [email or web address].

## 2021-07-30 NOTE — Progress Notes (Signed)
Physical Therapy Treatment Patient Details Name: Randy [name] MRN: [id number] DOB: 1934/01/13 Today's Date: 07/30/2021   History of Present Illness Randy [name] is a 87 y.o. male with medical history significant for Lewy body dementia, parkinsonism followed by neurology, osteoarthritis, prostate cancer, essential hypertension, brought in by family who reports a decline in mobility since a fall on 7/18.  Patient did not hit his head and had no loss of consciousness and did not appear to suffer any apparent injury but seems to have difficulty ambulating.  He appears to have pain in his left hip and has not been participating with his self-care as of his baseline.  Since the fall he also appears to have worsening of his baseline mentation and slurred speech.  Patient presented to the ED the day prior, and was evaluated with a head CT that showed no acute intracranial findings. They left without being seen due to prolonged ED wait times but returned today due to continued symptoms.  MRI negative, L hip xray reveals no acute findings.    PT Comments    Pt is making good progress towards goals, with ability to stand from [facility] and step over to bed and then to recliner. RW used and still requires +2 assist for safety. Pt with confusion at times, however able to participate and follow commands for seated there-ex. Family in room throughout session. Will continue to progress as able. Still recommending SNF for post acute rehab. Continue to progress.   Recommendations for follow up therapy are one component of a multi-disciplinary discharge planning process, led by the attending physician.  Recommendations may be updated based on patient status, additional functional criteria and insurance authorization.  Follow Up Recommendations  Skilled nursing-short term rehab (<3 hours/day) Can patient physically be transported by private vehicle: Yes   Assistance Recommended at Discharge Frequent or constant  Supervision/Assistance  Patient can return home with the following Two people to help with walking and/or transfers;A lot of help with bathing/dressing/bathroom   Equipment Recommendations  None recommended by PT    Recommendations for Other Services       Precautions / Restrictions Precautions Precautions: Fall Restrictions Weight Bearing Restrictions: No     Mobility  Bed Mobility               General bed mobility comments: not performed at this time as received in seated on BSC.    Transfers Overall transfer level: Needs assistance Equipment used: Rolling walker (2 wheels) Transfers: Sit to/from Stand, Bed to chair/wheelchair/BSC Sit to Stand: Mod assist, +2 physical assistance   Step pivot transfers: Mod assist, +2 physical assistance       General transfer comment: needs cues and assist as pt demonstrates mod post lean, however with increased reps, able to perform sit<>Stand with mod A +1 and improved balance. Keeps B knees flexed during standing. Pt then able to perform step pivot to recliner with cues and +2 assist    Ambulation/Gait               General Gait Details: pt was able to take steps from BSC->bed->recliner. RW used and mod A +2   Stairs             Wheelchair Mobility    Modified Rankin (Stroke Patients Only)       Balance Overall balance assessment: Needs assistance, History of Falls Sitting-balance support: Feet supported Sitting balance-Leahy Scale: Good     Standing balance support: Bilateral upper extremity supported,  During functional activity, Reliant on assistive device for balance Standing balance-Leahy Scale: Poor                              Cognition Arousal/Alertness: Awake/alert Behavior During Therapy: WFL for tasks assessed/performed Overall Cognitive Status: History of cognitive impairments - at baseline                                 General Comments: follows commands  but confused to higher level tasks        Exercises Other Exercises Other Exercises: seated ther-ex performed including LAQ, hip add squeezes, alt marching, and B UE shoulder flexion. 10 reps performed with supervision. Other Exercises: Pt received on BSC. +2 assist for standing with CNA able to assist with hygiene. Pt incontient and urinated on floor during transfer. Cleaning provided    General Comments        Pertinent Vitals/Pain Pain Assessment Pain Assessment: No/denies pain    Home Living                          Prior Function            PT Goals (current goals can now be found in the care plan section) Acute Rehab PT Goals Patient Stated Goal: none stated PT Goal Formulation: With patient Time For Goal Achievement: 08/06/21 Progress towards PT goals: Progressing toward goals    Frequency    Min 2X/week      PT Plan Current plan remains appropriate    Co-evaluation              AM-PAC PT "6 Clicks" Mobility   Outcome Measure  Help needed turning from your back to your side while in a flat bed without using bedrails?: A Little Help needed moving from lying on your back to sitting on the side of a flat bed without using bedrails?: A Little Help needed moving to and from a bed to a chair (including a wheelchair)?: A Little Help needed standing up from a chair using your arms (e.g., wheelchair or bedside chair)?: A Lot Help needed to walk in hospital room?: A Lot Help needed climbing 3-5 steps with a railing? : Total 6 Click Score: 14    End of Session Equipment Utilized During Treatment: Gait belt Activity Tolerance: Patient tolerated treatment well Patient left: in chair;with call bell/phone within reach;with chair alarm set Nurse Communication: Mobility status PT Visit Diagnosis: Other abnormalities of gait and mobility (R26.89);Muscle weakness (generalized) (M62.81);History of falling (Z91.81);Difficulty in walking, not elsewhere  classified (R26.2)     Time: 8101-7510 PT Time Calculation (min) (ACUTE ONLY): 19 min  Charges:  $Therapeutic Exercise: 8-22 mins                     Greggory Stallion, PT, DPT, GCS [phone]    Camry Robello 07/30/2021, 3:24 PM

## 2021-07-31 DIAGNOSIS — R262 Difficulty in walking, not elsewhere classified: Secondary | ICD-10-CM | POA: Diagnosis not present

## 2021-07-31 MED ORDER — ENALAPRIL MALEATE 20 MG PO TABS: 20.0000 mg | ORAL_TABLET | Freq: Every day | ORAL | Status: DC

## 2021-07-31 MED ORDER — ENALAPRIL MALEATE 10 MG PO TABS: 10.0000 mg | ORAL_TABLET | Freq: Two times a day (BID) | ORAL | Status: DC

## 2021-07-31 MED ORDER — MELATONIN 5 MG PO TABS: 5.0000 mg | ORAL_TABLET | Freq: Every day | ORAL | 0 refills | Status: DC

## 2021-07-31 MED ORDER — POTASSIUM CHLORIDE 20 MEQ PO PACK
40.0000 meq | PACK | Freq: Once | ORAL | Status: AC
  Administered 2021-07-31: 40 meq via ORAL
  Filled 2021-07-31: qty 2

## 2021-07-31 MED ORDER — CYANOCOBALAMIN 1000 MCG PO TABS: 1000.0000 ug | ORAL_TABLET | Freq: Every day | ORAL | Status: DC

## 2021-07-31 MED ORDER — ENALAPRIL MALEATE 10 MG PO TABS
10.0000 mg | ORAL_TABLET | Freq: Two times a day (BID) | ORAL | Status: DC
  Administered 2021-07-31: 10 mg via ORAL
  Filled 2021-07-31 (×2): qty 1

## 2021-07-31 NOTE — Progress Notes (Signed)
Mobility Specialist - Progress Note    07/31/21 1403  Mobility  Activity Ambulated with assistance in room;Transferred from chair to bed;Dangled on edge of bed;Stood at bedside  Level of Assistance +2 (takes two people)  Assistive Device Front wheel walker  Activity Response Tolerated well  $Mobility charge 1 Mobility    Pt sitting in recliner on RA upon arrival. PT STS and pivot transfers from recliner to bed with +2 MODA. Pt left supine in bed with needs in reach and bed alarm set.   Gretchen Short  Mobility Specialist  07/31/21 2:05 PM

## 2021-07-31 NOTE — Progress Notes (Signed)
Pt hasn't slept thus far during shift, also didn't sleep previous night either, will pass on to day shift to request meds to help pt sleep at night

## 2021-07-31 NOTE — Discharge Summary (Signed)
Physician Discharge Summary   Patient: Randy [name] MRN: [id number] DOB: 1934-02-11  Admit date:     07/28/2021  Discharge date: 07/31/21  Discharge Physician: Lorella Nimrod   PCP: [name], DO   Recommendations at discharge:  Follow-up with neurology in 1 to 2 weeks Follow-up with primary care provider.  Discharge Diagnoses: Principal Problem:   Ambulatory dysfunction Active Problems:   Altered mental status   Unable to care for self   Parkinsonism ([place])   Lewy body dementia without behavioral disturbance (HCC)   Hypertensive urgency   Osteoarthritis of multiple joints   History of prostate cancer   Hospital Course:  Randy [name] is a 87 y.o. male with medical history significant for Lewy body dementia, parkinsonism followed by neurology, osteoarthritis, prostate cancer, essential hypertension, brought in by family who reports a decline in mobility since a fall on 7/18.  Patient did not hit his head and had no loss of consciousness and did not appear to suffer any apparent injury but seems to have difficulty ambulating.  He appears to have pain in his left hip and has not been participating with his self-care as of his baseline.  Since the fall he also appears to have worsening of his baseline mentation and slurred speech.  Admitted the following day for AMS and difficulty ambulating.    He appears to have pain in his left hip and has not been participating with his self-care as of his baseline.  Since the fall he also appears to have worsening of his baseline mentation and slurred speech.  Patient presented to the ED the day prior, and was evaluated with a head CT that showed no acute intracranial findings.  They left without being seen due to prolonged ED wait times but returned today due to continued symptoms. ED course and data review: Afebrile, pulse 70, respirations 18, BP 171/93 with O2 sat of 100% on room air. Labs: CBC normal except for hemoglobin of 12.2.  CMP  for the most part unremarkable, troponin 9.  Urinalysis sterile. X-ray left hip showing severe degenerative changes but no recent fracture or dislocation CT hip confirms no acute fracture or dislocation, severe left hip osteoarthritis and chronic healed fracture of the left superior and inferior pubic rami.  MRI brain normal.   Neurology was consulted and according to the recommendation it looks like having some advancement in his parkinsonism and Lewy body dementia.  Per neurology he is on the right track of medications and no changes were made.  Due to difficulty in ambulation PT and OT evaluation was obtained and they recommended rehab.  7/24: Patient remained stable, close to baseline and waiting for rehab placement. B12 levels at 257, neurology recommending starting supplement to keep the levels above 400. B12 supplement was started.  7/25: Patient remained stable.  Had an assisted fall where while transferring from bedside commode to bed his legs gave away and he was generally layed to the ground, no injuries.  Patient is being discharged to rehab for further management. He will continue current medications and follow-up with his outpatient providers especially outpatient neurology for further recommendations as there is a concern of Parkinson's progression.  Assessment and Plan: * Ambulatory dysfunction Left hip pain secondary to recent fall, history of falls Severe degenerative joint disease Suspect multifactorial and related to pain from recent fall, severe DJD, Lower suspicion for acute CVA, MRI brain was negative.  CT hip was negative for any acute fractures. Continue Parkinson meds  Pain control Fall precautions PT eval-recommending rehab. Medically stable for discharge  Altered mental status Family reported altered mental status from baseline since fall on 7/18 Improving.  CT head and MRI was without any acute abnormality.  Neurology was consulted and they suggested  checking B12 which was 257 so he was started on a supplement to keep the levels above 400 per neurology. Most likely advancement in his Parkinson's and Lewy body dementia. -Continue to monitor   Unable to care for self Caregiver unable to cope PT and TOC evaluation-recommending rehab Awaiting placement  Parkinsonism [facility]) Patient with history of Parkinson's and Lewy body dementia. -Continue current management -Continue outpatient neurology follow-up  Lewy body dementia without behavioral disturbance ([place]) Parkinsonism Last seen by neurology in May 2023 Continue carbidopa-levodopa, rivastigmine and Zoloft Delirium precautions - Minimize/avoid deliriogenic meds including: anticholinergic, opiates, benzodiazepines           - Maintain hydration, oxygenation, nutrition           - Limit use of restraints and catheters           - Normalize sleep patterns by minimizing nighttime noise, light and interruptions by                -Ensure sleep apnea treatment is provided overnight.             clustering care, opening blinds during the day           - Reorient the patient frequently, provide easily visible clock and calendar           - Provide sensory aids like glasses, hearing aids           - Encourage ambulation, regular activities and visitors to maintain cognitive stimulation              -Patient would benefit from having family members at bedside to reinforce his orientation. Haldol prn agitation   Hypertensive urgency Blood pressure remained elevated. -Increase the dose of enalapril to 10 mg daily.  Currently on Lasix for unclear reason but will continue  Osteoarthritis of multiple joints Continue acetaminophen and tramadol  History of prostate cancer Followed annually by urology and was last seen a couple months ago on chart review     Consultants: Neurology Procedures performed: None Disposition: Skilled nursing facility Diet recommendation:  Discharge Diet  Orders (From admission, onward)     Start     Ordered   07/31/21 0000  Diet - low sodium heart healthy        07/31/21 1408           Cardiac diet DISCHARGE MEDICATION: Allergies as of 07/31/2021       Reactions   Antihistamines, Chlorpheniramine-type Other (See Comments)   Can't urinate        Medication List     STOP taking these medications    zinc sulfate 220 (50 Zn) MG capsule       TAKE these medications    acetaminophen 500 MG tablet Commonly known as: TYLENOL Take 1,000 mg by mouth every 6 (six) hours as needed for moderate pain.   carbidopa-levodopa 50-200 MG tablet Commonly known as: SINEMET CR Take 1 tablet by mouth 4 (four) times daily.   cholecalciferol 1000 units tablet Commonly known as: VITAMIN D Take 1,000 Units by mouth daily.   Co Q 10 100 MG Caps Take 100 mg by mouth daily.   cyanocobalamin 1000 MCG tablet Take 1 tablet (1,000 mcg total) by  mouth daily. Start taking on: August 01, 2021   diclofenac Sodium 1 % Gel Commonly known as: VOLTAREN APPLY 2 G TOPICALLY 4 (FOUR) TIMES DAILY AS NEEDED. TO KNEE ARTHRITIS PAIN   enalapril 10 MG tablet Commonly known as: VASOTEC Take 1 tablet (10 mg total) by mouth 2 (two) times daily. What changed:  medication strength how much to take when to take this   Fish Oil 1200 MG Caps Take 1,200 mg by mouth in the morning, at noon, in the evening, and at bedtime.   fluticasone 50 MCG/ACT nasal spray Commonly known as: FLONASE Place 2 sprays into both nostrils as needed for allergies.   furosemide 20 MG tablet Commonly known as: LASIX Take 1 tablet (20 mg total) by mouth every other day. What changed: additional instructions   melatonin 5 MG Tabs Take 1 tablet (5 mg total) by mouth at bedtime. What changed:  medication strength how much to take   multivitamin with minerals Tabs tablet Take 1 tablet by mouth daily.   omeprazole 20 MG capsule Commonly known as: PRILOSEC Take 20 mg by  mouth 2 (two) times daily.   rivastigmine 3 MG capsule Commonly known as: EXELON Take 3 mg by mouth 2 (two) times daily.   sertraline 50 MG tablet Commonly known as: ZOLOFT Take 50 mg by mouth daily.   traMADol 50 MG tablet Commonly known as: ULTRAM Take 1 tablet (50 mg total) by mouth every 8 (eight) hours as needed.   vitamin C 500 MG tablet Commonly known as: ASCORBIC ACID Take 500 mg by mouth daily.        Follow-up Information     [name], DO. Schedule an appointment as soon as possible for a visit in 1 week(s).   Specialty: Family Medicine Contact information: [place] [address] [phone]         Kate Sable, MD .   Specialties: Cardiology, Radiology Contact information: [place] [address] [phone]                Discharge Exam: Danley Danker Weights   07/28/21 1313  Weight: 99.8 kg   General.     In no acute distress. Pulmonary.  Lungs clear bilaterally, normal respiratory effort. CV.  Regular rate and rhythm, no JVD, rub or murmur. Abdomen.  Soft, nontender, nondistended, BS positive. CNS.  Alert and oriented to self and person.  No focal neurologic deficit. Extremities.  No edema, no cyanosis, pulses intact and symmetrical. Psychiatry.  Appears to have mild cognitive impairment.  Condition at discharge: stable  The results of significant diagnostics from this hospitalization (including imaging, microbiology, ancillary and laboratory) are listed below for reference.   Imaging Studies: EEG adult  Result Date: Aug 06, 2021 Lora Havens, MD     08-06-2021  8:04 PM Patient Name: TERREZ ANDER MRN: [id number] Epilepsy Attending: Lora Havens Referring Physician/Provider: Derek Jack, MD Date: 08-06-2021 Duration: 25.51 mins Patient history: 50 old male with altered mental status.  EEG to evaluate for seizure. Level of alertness: Awake AEDs during EEG study: None Technical aspects:  This EEG study was done with scalp electrodes positioned according to the 10-20 International system of electrode placement. Electrical activity was acquired at a sampling rate of '500Hz'$  and reviewed with a high frequency filter of '70Hz'$  and a low frequency filter of '1Hz'$ . EEG data were recorded continuously and digitally stored. Description: The posterior dominant rhythm consists of 8-'9Hz'$  activity of moderate  voltage (25-35 uV) seen predominantly in posterior head regions, symmetric and reactive to eye opening and eye closing. Hyperventilation and photic stimulation were not performed.   IMPRESSION: This study is within normal limits. No seizures or epileptiform discharges were seen throughout the recording. Lora Havens   DG HIP UNILAT WITH PELVIS 2-3 VIEWS RIGHT  Result Date: 07/29/2021 CLINICAL DATA:  Right hip pain.  Dementia.  Declining mobility. EXAM: DG HIP (WITH OR WITHOUT PELVIS) 2-3V RIGHT COMPARISON:  07/28/2021 FINDINGS: The right hip appears intact. No signs of acute fracture or dislocation. Remote deformities involving the left superior and inferior pubic rami. Severe left hip osteoarthritis is unchanged. IMPRESSION: 1. No acute findings. 2. Severe left hip osteoarthritis. Electronically Signed   By: Kerby Moors M.D.   On: 07/29/2021 10:25   MR BRAIN WO CONTRAST  Result Date: 07/28/2021 CLINICAL DATA:  Stroke follow-up EXAM: MRI HEAD WITHOUT CONTRAST TECHNIQUE: Multiplanar, multiecho pulse sequences of the brain and surrounding structures were obtained without intravenous contrast. COMPARISON:  09/08/2018 FINDINGS: Truncated examination. Only diffusion-weighted imaging and sagittal T1-weighted imaging was obtained. There is no acute ischemia. Major midline structures are unremarkable. IMPRESSION: Truncated examination. No acute ischemia. Electronically Signed   By: Ulyses Jarred M.D.   On: 07/28/2021 22:41   CT Hip Left Wo Contrast  Result Date: 07/28/2021 CLINICAL DATA:  Hip pain,  stress fracture suspected. Negative x-ray. Patient fell on Tuesday 07/24/2021 night. EXAM: CT OF THE LEFT HIP WITHOUT CONTRAST TECHNIQUE: Multidetector CT imaging of the left hip was performed according to the standard protocol. Multiplanar CT image reconstructions were also generated. RADIATION DOSE REDUCTION: This exam was performed according to the departmental dose-optimization program which includes automated exposure control, adjustment of the mA and/or kV according to patient size and/or use of iterative reconstruction technique. COMPARISON:  None Available. FINDINGS: Bones/Joint/Cartilage No evidence of acute fracture or dislocation. Chronic healed fracture of the left superior and inferior pubic rami. Advanced left hip osteoarthritis with bone-on-bone articulation and subchondral cystic changes and prominent marginal osteophytes. No appreciable joint effusion. Mild degenerative changes of the left sacroiliac joint and pubic symphysis. Ligaments Suboptimally assessed by CT. Muscles and Tendons No acute abnormality. No intramuscular hematoma or fluid collection. Soft tissues Skin and subcutaneous soft tissues are unremarkable. IMPRESSION: 1.  No acute fracture or dislocation. 2. Severe left hip osteoarthritis. Chronic healed fracture of the left superior and inferior pubic rami. 3. Muscles and subcutaneous soft tissues are unremarkable without evidence of acute abnormality. Electronically Signed   By: Keane Police D.O.   On: 07/28/2021 18:56   DG HIP UNILAT WITH PELVIS 2-3 VIEWS LEFT  Result Date: 07/28/2021 CLINICAL DATA:  Recent fall, pain EXAM: DG HIP (WITH OR WITHOUT PELVIS) 2-3V LEFT COMPARISON:  None Available. FINDINGS: No recent fracture or dislocation is seen. Deformity in the left superior pubis ramus was also evident in previous CT done on 09/08/2018. Severe degenerative changes are noted in left hip joint with joint space, subcortical cysts narrowing and bony spurs. IMPRESSION: No recent  fracture or dislocation is seen in left hip. Severe degenerative changes are noted in the left hip. Deformity in the superior ramus of left pubic bone may be residual from previous injury. Electronically Signed   By: Elmer Picker M.D.   On: 07/28/2021 13:53   CT HEAD WO CONTRAST (5MM)  Result Date: 07/27/2021 CLINICAL DATA:  Altered mental status EXAM: CT HEAD WITHOUT CONTRAST TECHNIQUE: Contiguous axial images were obtained from the base of the skull  through the vertex without intravenous contrast. RADIATION DOSE REDUCTION: This exam was performed according to the departmental dose-optimization program which includes automated exposure control, adjustment of the mA and/or kV according to patient size and/or use of iterative reconstruction technique. COMPARISON:  10/12/2019 FINDINGS: Brain: No acute intracranial findings are seen. There are no signs of bleeding within the cranium. Cortical sulci are prominent. There is decreased density in periventricular white matter suggesting small-vessel disease. Vascular: Unremarkable. Skull: There is small bony defect in the posterior wall of right frontal sinus on the left side. Similar finding was seen on the previous study. Sinuses/Orbits: There is partial opacification of left side of frontal sinus. There are no air-fluid levels in the paranasal sinuses. Other: None. IMPRESSION: No acute intracranial findings are seen in noncontrast CT brain. Atrophy. Small-vessel disease. There is defect in the posterior wall of left frontal sinus, possibly related to infectious process or previous intervention in the left frontal sinus. There is no evidence of any edema or mass effect in the adjacent brain. Electronically Signed   By: Elmer Picker M.D.   On: 07/27/2021 17:07    Microbiology: Results for orders placed or performed during the hospital encounter of 10/10/20  Urine Culture     Status: None   Collection Time: 10/10/20  2:25 PM   Specimen: Urine, Random   Result Value Ref Range Status   Specimen Description   Final    URINE, RANDOM Performed at [facility], [address], [place], [place] [address]    Special Requests   Final    NONE Performed at [facility], [address] [place], [place] [address]    Culture   Final    NO GROWTH Performed at [facility], [place] [address], [place], [place] [address]    Report Status 10/11/2020 FINAL  Final    Labs: CBC: Recent Labs  Lab 07/27/21 1505 07/28/21 1859 07/28/21 2319 07/30/21 0413  WBC 5.8 6.6 6.1 5.1  NEUTROABS  --  4.3  --   --   HGB 12.5* 12.2* 11.6* 11.7*  HCT 39.2 37.8* 35.3* 35.2*  MCV 94.9 94.3 92.9 93.4  PLT 190 185 175 633   Basic Metabolic Panel: Recent Labs  Lab 07/27/21 1505 07/28/21 1859 07/28/21 2319 07/30/21 0413  NA 138 138  --  141  K 3.8 3.9  --  3.4*  CL 102 102  --  106  CO2 29 29  --  27  GLUCOSE 100* 98  --  105*  BUN 23 28*  --  24*  CREATININE 0.76 0.72 0.72 0.75  CALCIUM 8.8* 8.6*  --  8.8*   Liver Function Tests: Recent Labs  Lab 07/27/21 1505 07/28/21 1859 07/30/21 0413  AST 46* 24 16  ALT 9 14 <5  ALKPHOS 101 90 81  BILITOT 0.9 1.3* 1.0  PROT 7.2 6.8 6.2*  ALBUMIN 3.7 3.6 3.2*   CBG: No results for input(s): "GLUCAP" in the last 168 hours.  Discharge time spent: greater than 30 minutes.  This record has been created using Systems analyst. Errors have been sought and corrected,but may not always be located. Such creation errors do not reflect on the standard of care.   Signed: Lorella Nimrod, MD [facility] 07/31/2021

## 2021-07-31 NOTE — TOC Transition Note (Signed)
Transition of Care [facility]) - CM/SW Discharge Note   Patient Details  Name: Randy [name] MRN: [id number] Date of Birth: [birth year]  [facility] Care [facility]) CM/SW Contact:  Eileen Stanford, LCSW Phone Number: 07/31/2021, 2:10 PM   Clinical Narrative:   Clinical Social Worker facilitated patient discharge including contacting patient family and facility to confirm patient discharge plans.  Clinical information faxed to facility and family agreeable with plan.  CSW arranged ambulance transport via [facility] to [facility] (room 22b) .  RN to call [phone] for report prior to discharge.   Final next level of care: Skilled Nursing Facility Barriers to Discharge: No Barriers Identified   Patient Goals and CMS Choice Patient states their goals for this hospitalization and ongoing recovery are:: Per daughter, go to SNF for rehab and eventually return home when stronger CMS Medicare.gov Compare Post Acute Care list provided to:: Patient Represenative (must comment) (Daughter) Choice offered to / list presented to : Adult Children  Discharge Placement              Patient chooses bed at:  [facility]) Patient to be transferred to facility by: [facility] Name of family member notified: daughter-Shelia Patient and family notified of of transfer: 07/31/21  Discharge Plan and Services     Post Acute Care Choice: [place]                               Social Determinants of Health (SDOH) Interventions     Readmission Risk Interventions    10/11/2019    9:10 AM  Readmission Risk Prevention Plan  Transportation Screening Complete  PCP or Specialist Appt within 3-5 Days Complete  HRI or [place] Complete  Social Work Consult for [place] Planning/Counseling Complete  Palliative Care Screening Not Applicable  Medication Review Press photographer) Complete

## 2021-07-31 NOTE — Progress Notes (Signed)
EEG normal. Vitamin B12: 257 I would recommend supplementing to keep levels over 400. Management per medicine hospitalist and PCP.  No further inpatient neurological work up.  -- Amie Portland, MD Neurologist [facility] Pager: [phone]

## 2021-07-31 NOTE — Care Management Important Message (Signed)
Important Message  Patient Details  Name: Randy [name] MRN: [id number] Date of Birth: 1934-03-08   Medicare Important Message Given:  Yes  I reviewed the Important Message from Medicare with the patient's daughter, [name] by phone [phone]) and she is agreement with the discharge. I wished him a speedy recovery and thanked her for her time.   [name] 07/31/2021, 2:32 PM

## 2021-07-31 NOTE — Progress Notes (Signed)
Mobility Specialist - Progress Note   07/31/21 1111  Mobility  Activity Ambulated with assistance in room;Stood at bedside;Transferred from bed to chair;Dangled on edge of bed  Level of Assistance +2 (takes two people)  Assistive Device Front wheel walker  Activity Response Tolerated well  $Mobility charge 1 Mobility    Pt supine in bed on RA upon arrival. PT STS and transfers to [place] with +2 MODA. Pt was left in recliner with needs in reach and chair alarm on.   Gretchen Short  Mobility Specialist  07/31/2021

## 2021-07-31 NOTE — TOC Progression Note (Addendum)
Transition of Care [facility]) - Progression Note    Patient Details  Name: Randy [name] MRN: [id number] Date of Birth: [birth year]  Transition of Care [facility]) CM/SW Contact  Eileen Stanford, LCSW Phone Number: 07/31/2021, 1:59 PM  Clinical Narrative:   [facility] can accept. Daughter aware and agreeable. Auth started in [place].  Obtained auth. Info given to [facility] with [facility]. They can accept pt today- bed 22b. MD ready to dc.     Expected Discharge Plan: [place] Barriers to Discharge: Continued Medical Work up  Expected Discharge Plan and Services Expected Discharge Plan: [place] Choice: [place] arrangements for the past 2 months: Single Family Home                                       Social Determinants of Health (SDOH) Interventions    Readmission Risk Interventions    10/11/2019    9:10 AM  Readmission Risk Prevention Plan  Transportation Screening Complete  PCP or Specialist Appt within 3-5 Days Complete  HRI or [place] Complete  Social Work Consult for [place] Planning/Counseling Complete  Palliative Care Screening Not Applicable  Medication Review Press photographer) Complete

## 2021-07-31 NOTE — Progress Notes (Signed)
Called report to [facility]  RN  [facility] [phone], patient going to room 22B

## 2021-08-06 ENCOUNTER — Encounter (INDEPENDENT_AMBULATORY_CARE_PROVIDER_SITE_OTHER): Payer: Self-pay | Admitting: Nurse Practitioner

## 2021-08-06 DIAGNOSIS — I10 Essential (primary) hypertension: Secondary | ICD-10-CM

## 2021-08-06 NOTE — Progress Notes (Signed)
Subjective:    Patient ID: Randy [name], male    DOB: 09-24-1934, 87 y.o.   MRN: [id number] No chief complaint on file.   The patient returns to the office for followup evaluation regarding leg swelling.  The swelling has improved quite a bit and the pain associated with swelling has decreased substantially. There have not been any interval development of a ulcerations or wounds.  Since the previous visit the patient has been wearing graduated compression stockings and has noted some improvement in the lymphedema. The patient has been using compression routinely morning until night.  The patient also states elevation during the day and exercise (such as walking) is being done too.        Review of Systems  Cardiovascular:  Positive for leg swelling.  Neurological:  Positive for weakness.  All other systems reviewed and are negative.      Objective:   Physical Exam Vitals reviewed.  HENT:     Head: Normocephalic.  Cardiovascular:     Rate and Rhythm: Normal rate.  Pulmonary:     Effort: Pulmonary effort is normal.  Musculoskeletal:     Right lower leg: Edema present.     Left lower leg: Edema present.  Skin:    General: Skin is warm and dry.  Neurological:     Mental Status: He is alert and oriented to person, place, and time.  Psychiatric:        Mood and Affect: Mood normal.        Behavior: Behavior normal.        Thought Content: Thought content normal.        Judgment: Judgment normal.     BP (!) 149/90 (BP Location: Right Arm)   Pulse 76   Resp 16   Past Medical History:  Diagnosis Date   Arthritis    GERD (gastroesophageal reflux disease)    Hyperlipidemia    Hypertension    Lewy body dementia (HCC)    Osteoporosis    Prostate enlargement     Social History   Socioeconomic History   Marital status: Married    Spouse name: Not on file   Number of children: Not on file   Years of education: College   Highest education level: Not on file   Occupational History   Occupation: Retired Clinical biochemist  Tobacco Use   Smoking status: Former    Packs/day: 1.00    Years: 12.00    Total pack years: 12.00    Types: Cigarettes    Quit date: 1968    Years since quitting: 55.6   Smokeless tobacco: Never  Vaping Use   Vaping Use: Never used  Substance and Sexual Activity   Alcohol use: No   Drug use: No   Sexual activity: Not Currently  Other Topics Concern   Not on file  Social History Narrative   Not on file   Social Determinants of Health   Financial Resource Strain: Low Risk  (10/09/2020)   Overall Financial Resource Strain (CARDIA)    Difficulty of Paying Living Expenses: Not hard at all  Food Insecurity: No Food Insecurity (10/09/2020)   Hunger Vital Sign    Worried About Running Out of Food in the Last Year: Never true    Ran Out of Food in the Last Year: Never true  Transportation Needs: No Transportation Needs (10/09/2020)   PRAPARE - Hydrologist (Medical): No    Lack of Transportation (Non-Medical): No  Physical Activity: Inactive (07/25/2020)   Exercise Vital Sign    Days of Exercise per Week: 0 days    Minutes of Exercise per Session: 0 min  Stress: No Stress Concern Present (07/25/2020)   [place] of Stress : Not at all  Social Connections: [place] (10/09/2020)   Social Connection and Isolation Panel [NHANES]    Frequency of Communication with Friends and Family: More than three times a week    Frequency of Social Gatherings with Friends and Family: More than three times a week    Attends Religious Services: More than 4 times per year    Active Member of Clubs or Organizations: Yes    Attends Archivist Meetings: More than 4 times per year    Marital Status: Married  Human resources officer Violence: Not At Risk (10/09/2020)   Humiliation, Afraid, Rape, and Kick questionnaire    Fear of  Current or Ex-Partner: No    Emotionally Abused: No    Physically Abused: No    Sexually Abused: No    Past Surgical History:  Procedure Laterality Date   CHOLECYSTECTOMY N/A 09/10/2018   Procedure: LAPAROSCOPIC CHOLECYSTECTOMY;  Surgeon: Benjamine Sprague, DO;  Location: [facility] ORS;  Service: General;  Laterality: N/A;   CYSTOSCOPY WITH LITHOLAPAXY N/A 10/20/2020   Procedure: CYSTOSCOPY WITH LITHOLAPAXY;  Surgeon: [name], MD;  Location: [facility] ORS;  Service: Urology;  Laterality: N/A;   GANGLION CYST EXCISION Left 2013   Left upper arm   HERNIA REPAIR N/A 2008   [place], ventral acquired hernia repair   HOLEP-LASER ENUCLEATION OF THE PROSTATE WITH MORCELLATION N/A 11/06/2018   Procedure: HOLEP-LASER ENUCLEATION OF THE PROSTATE WITH MORCELLATION;  Surgeon: [name], MD;  Location: [facility] ORS;  Service: Urology;  Laterality: N/A;   HOLEP-LASER ENUCLEATION OF THE PROSTATE WITH MORCELLATION N/A 10/20/2020   Procedure: HOLEP-LASER ENUCLEATION OF THE PROSTATE WITH MORCELLATION;  Surgeon: [name], MD;  Location: [facility] ORS;  Service: Urology;  Laterality: N/A;    Family History  Problem Relation Age of Onset   Breast cancer Mother    Lung cancer Father    Heart disease Brother    Leukemia Brother    Prostate cancer Neg Hx    Colon cancer Neg Hx    Bladder Cancer Neg Hx    Kidney cancer Neg Hx     Allergies  Allergen Reactions   Antihistamines, Chlorpheniramine-Type Other (See Comments)    Can't urinate       Latest Ref Rng & Units 07/30/2021    4:13 AM 07/28/2021   11:19 PM 07/28/2021    6:59 PM  CBC  WBC 4.0 - 10.5 K/uL 5.1  6.1  6.6   Hemoglobin 13.0 - 17.0 g/dL 11.7  11.6  12.2   Hematocrit 39.0 - 52.0 % 35.2  35.3  37.8   Platelets 150 - 400 K/uL 170  175  185       CMP     Component Value Date/Time   NA 141 07/30/2021 0413   NA 143 04/17/2015 0826   K 3.4 (L) 07/30/2021 0413   K 4.0 04/17/2015 0826   CL 106 07/30/2021 0413   CL 102 04/17/2015 0826    CO2 27 07/30/2021 0413   CO2 25 11/21/2014 0905   GLUCOSE 105 (H) 07/30/2021 0413   BUN 24 (H) 07/30/2021 0413   BUN 18 04/17/2015 0826   CREATININE 0.75 07/30/2021  0413   CREATININE 1.10 04/02/2019 0925   CALCIUM 8.8 (L) 07/30/2021 0413   CALCIUM 8.5 (A) 04/17/2015 0826   PROT 6.2 (L) 07/30/2021 0413   PROT 6.3 04/17/2015 0826   ALBUMIN 3.2 (L) 07/30/2021 0413   ALBUMIN 3.9 04/17/2015 0826   AST 16 07/30/2021 0413   AST 14 04/17/2015 0826   ALT <5 07/30/2021 0413   ALT 8 04/17/2015 0826   ALKPHOS 81 07/30/2021 0413   ALKPHOS 62 04/17/2015 0826   BILITOT 1.0 07/30/2021 0413   BILITOT 0.9 04/17/2015 0826   GFRNONAA >60 07/30/2021 0413   GFRNONAA 62 04/02/2019 0925   GFRAA >60 10/12/2019 0416   GFRAA 72 04/02/2019 0925     No results found.     Assessment & Plan:   1. Lymphedema Recommend:  No surgery or intervention at this point in time.    I have reviewed my previous discussion with the patient regarding swelling and why it  causes symptoms.  The patient is doing well with compression and will continue wearing graduated compression on a daily basis. The patient will  continue wearing the compression first thing in the morning and removing them in the evening. The patient is instructed specifically not to sleep in the compression.    In addition, behavioral modification including elevation during the day and exercise as tolerated will be continued.    Patient should follow-up in 6 months  2. Essential hypertension Continue antihypertensive medications as already ordered, these medications have been reviewed and there are no changes at this time.   3. Mixed hyperlipidemia Continue statin as ordered and reviewed, no changes at this time    Current Outpatient Medications on File Prior to Visit  Medication Sig Dispense Refill   acetaminophen (TYLENOL) 500 MG tablet Take 1,000 mg by mouth every 6 (six) hours as needed for moderate pain.     carbidopa-levodopa  (SINEMET CR) 50-200 MG tablet Take 1 tablet by mouth 4 (four) times daily.     cholecalciferol (VITAMIN D) 1000 units tablet Take 1,000 Units by mouth daily.     Coenzyme Q10 (CO Q 10) 100 MG CAPS Take 100 mg by mouth daily.      diclofenac Sodium (VOLTAREN) 1 % GEL APPLY 2 G TOPICALLY 4 (FOUR) TIMES DAILY AS NEEDED. TO KNEE ARTHRITIS PAIN (Patient not taking: Reported on 07/28/2021) 100 g 3   fluticasone (FLONASE) 50 MCG/ACT nasal spray Place 2 sprays into both nostrils as needed for allergies.     furosemide (LASIX) 20 MG tablet Take 1 tablet (20 mg total) by mouth every other day. (Patient taking differently: Take 20 mg by mouth every other day. Patient takes Mondays, Wednesdays, and Fridays.) 45 tablet 0   Multiple Vitamin (MULTIVITAMIN WITH MINERALS) TABS tablet Take 1 tablet by mouth daily. 30 tablet 0   Omega-3 Fatty Acids (FISH OIL) 1200 MG CAPS Take 1,200 mg by mouth in the morning, at noon, in the evening, and at bedtime.     omeprazole (PRILOSEC) 20 MG capsule Take 20 mg by mouth 2 (two) times daily.     rivastigmine (EXELON) 3 MG capsule Take 3 mg by mouth 2 (two) times daily.     sertraline (ZOLOFT) 50 MG tablet Take 50 mg by mouth daily.     traMADol (ULTRAM) 50 MG tablet Take 1 tablet (50 mg total) by mouth every 8 (eight) hours as needed. 30 tablet 2   vitamin C (ASCORBIC ACID) 500 MG tablet Take 500 mg by  mouth daily.     No current facility-administered medications on file prior to visit.    There are no Patient Instructions on file for this visit. No follow-ups on file.   Kris Hartmann, NP

## 2021-08-07 ENCOUNTER — Telehealth: Payer: Self-pay | Admitting: Family Medicine

## 2021-08-07 ENCOUNTER — Ambulatory Visit: Payer: Medicare Other

## 2021-08-07 NOTE — Telephone Encounter (Signed)
LVM for pt to rtn my call to reschedule AWV with [facility] that was scheduled for 08/07/21. Informed pt that we can r/s appt to Friday

## 2021-08-13 ENCOUNTER — Ambulatory Visit: Payer: Self-pay

## 2021-08-13 ENCOUNTER — Telehealth: Payer: Medicare Other

## 2021-08-13 NOTE — Patient Outreach (Signed)
Care Coordination   Follow Up Visit Note   08/13/2021 Name: Randy [name] MRN: [id number] DOB: Oct 24, 1934  Randy [name] is a 87 y.o. year old male who sees [name], DO for primary care. I spoke with  [name], patients daughter and DRP by phone today  What matters to the patients health and wellness today?  Getting the help for the patient while he is in rehab and if he returns home. Renewal of handicap place card, due to the one he has currently expiring 12-2021.     Goals Addressed             This Visit's Progress    RNCM: Effective Management of Lewy body dementia       Care Coordination Interventions: Evaluation of current treatment plan related to Lewy Body dementia and patient's adherence to plan as established by provider Advised patient to work with rehab facility on the plan of care for the patient and meeting the patients needs. Unsure at this time if the patient will be able to return home. Education and support given Provided education to patient re: discussing options with the care team at the rehab facility, working with the staff to meet the patients needs, having a plan if the patient is discharged home Collaborated with pcp regarding the daughter and thoughts about Hospice care if the patient is discharged home Discussed plans with patient for ongoing care management follow up and provided patient with direct contact information for care management team Advised patient to discuss changes in dementia and recommendations for effective management of Lewy body with provider Screening for signs and symptoms of depression related to chronic disease state  Assessed social determinant of health barriers        RNCM: Fall prevention and safety       Care Coordination Interventions: Provided written and verbal education re: potential causes of falls and Fall prevention strategies Reviewed medications and discussed potential side effects of medications such  as dizziness and frequent urination Advised patient of importance of notifying provider of falls Assessed for signs and symptoms of orthostatic hypotension Assessed for falls since last encounter. 08-13-2021: The patient had a fall on 07-24-2021 and was hospitalized 07-28-2021 to 07-31-2021. The patient is currently in rehab but still cannot walk. The patients daughter is thinking that the patient may need Hospice care once he is discharged. Will collaborate with the pcp concerning the daughters concerns Assessed patients knowledge of fall risk prevention secondary to previously provided education Provided patient information for fall alert systems Assessed working status of life alert bracelet and patient adherence Advised patient to discuss falls and safety prevention  with provider Screening for signs and symptoms of depression related to chronic disease state  Assessed social determinant of health barriers Patients handicap place card expires in December of 2023. Instructions provided today for the daughter to go to [facility] website and fill out paperwork and have pcp sign to get new place cards         08-13-2021: The patient has been contacted to schedule AWV  SDOH assessments and interventions completed:  Yes  SDOH Interventions Today    Flowsheet Row Most Recent Value  SDOH Interventions   Physical Activity Interventions Other (Comments)  [patient currently in rehab facility after a fall]  Transportation Interventions Intervention Not Indicated, Other (Comment)  [will need updated handicap placecard, expires 12-2021]        Care Coordination Interventions Activated:  Yes  Care Coordination Interventions:  Yes, provided   Follow up plan: Follow up call scheduled for 10-15-2021 at 0900 am    Encounter Outcome:  Pt. Visit Completed    Noreene Larsson RN, MSN, [place] Mobile: [phone]

## 2021-08-13 NOTE — Patient Instructions (Signed)
Visit Information  Thank you for taking time to visit with me today. Please don't hesitate to contact me if I can be of assistance to you.   Following are the goals we discussed today:   Goals Addressed             This Visit's Progress    RNCM: Effective Management of Lewy body dementia       Care Coordination Interventions: Evaluation of current treatment plan related to Lewy Body dementia and patient's adherence to plan as established by provider Advised patient to work with rehab facility on the plan of care for the patient and meeting the patients needs. Unsure at this time if the patient will be able to return home. Education and support given Provided education to patient re: discussing options with the care team at the rehab facility, working with the staff to meet the patients needs, having a plan if the patient is discharged home Collaborated with pcp regarding the daughter and thoughts about Hospice care if the patient is discharged home Discussed plans with patient for ongoing care management follow up and provided patient with direct contact information for care management team Advised patient to discuss changes in dementia and recommendations for effective management of Lewy body with provider Screening for signs and symptoms of depression related to chronic disease state  Assessed social determinant of health barriers        RNCM: Fall prevention and safety       Care Coordination Interventions: Provided written and verbal education re: potential causes of falls and Fall prevention strategies Reviewed medications and discussed potential side effects of medications such as dizziness and frequent urination Advised patient of importance of notifying provider of falls Assessed for signs and symptoms of orthostatic hypotension Assessed for falls since last encounter. 08-13-2021: The patient had a fall on 07-24-2021 and was hospitalized 07-28-2021 to 07-31-2021. The patient is  currently in rehab but still cannot walk. The patients daughter is thinking that the patient may need Hospice care once he is discharged. Will collaborate with the pcp concerning the daughters concerns Assessed patients knowledge of fall risk prevention secondary to previously provided education Provided patient information for fall alert systems Assessed working status of life alert bracelet and patient adherence Advised patient to discuss falls and safety prevention  with provider Screening for signs and symptoms of depression related to chronic disease state  Assessed social determinant of health barriers Patients handicap place card expires in December of 2023. Instructions provided today for the daughter to go to [facility] website and fill out paperwork and have pcp sign to get new place cards           Our next appointment is by telephone on 10-15-2021 at 0900 am  Please call the care guide team at [phone] if you need to cancel or reschedule your appointment.   If you are experiencing a Mental Health or [place] or need someone to talk to, please call the Suicide and Crisis Lifeline: 988 call the [place] National Suicide Prevention Lifeline: 778 729 8013 or TTY: 425-222-1894 TTY 2120630658) to talk to a trained counselor call 1-800-273-TALK (toll free, 24 hour hotline)  Patient verbalizes understanding of instructions and care plan provided today and agrees to view in [place]. Active MyChart status and patient understanding of how to access instructions and care plan via MyChart confirmed with patient.     Telephone follow up appointment with care management team member scheduled for: 10-15-2021 at 0900 am  Noreene Larsson RN, MSN, CCM Community Care Coordinator [facility] Mobile: [phone]

## 2021-08-20 ENCOUNTER — Encounter: Payer: Self-pay | Admitting: Nurse Practitioner

## 2021-08-20 ENCOUNTER — Non-Acute Institutional Stay: Payer: Medicare Other | Admitting: Nurse Practitioner

## 2021-08-20 VITALS — BP 144/82 | HR 82 | Temp 97.8°F | Resp 20 | Wt 192.7 lb

## 2021-08-20 DIAGNOSIS — R4189 Other symptoms and signs involving cognitive functions and awareness: Secondary | ICD-10-CM

## 2021-08-20 DIAGNOSIS — Z515 Encounter for palliative care: Secondary | ICD-10-CM

## 2021-08-20 DIAGNOSIS — F0280 Dementia in other diseases classified elsewhere without behavioral disturbance: Secondary | ICD-10-CM

## 2021-08-20 DIAGNOSIS — R5381 Other malaise: Secondary | ICD-10-CM

## 2021-08-20 NOTE — Progress Notes (Signed)
[facility] [facility] Palliative Care Consult Note Telephone: [phone]  Fax: [phone]    Date of encounter: 08/20/21 2:52 PM PATIENT NAME: Randy [name] [address] [place] [address]   [phone] (home)  DOB: 16-Apr-1934 MRN: [id number] PRIMARY CARE PROVIDER:    [name], DO,  [place] [address] [phone]  REFERRING PROVIDER:   Mesa [name]:    Contact Information     Name Relation Home Work Randy [name] Daughter [phone]  [phone]   Randy [name], Mode [phone]        I met face to face with patient and family in facility. Palliative Care was asked to follow this patient by consultation request of  Randy [name] to address advance care planning and complex medical decision making. This is a follow up visit.                                  ASSESSMENT AND PLAN / RECOMMENDATIONS:  Symptom Management/Plan: 1. Advance Care Planning;  Full code, ongoing discussion  2. Goals of Care: Goals include to maximize quality of life and symptom management. Our advance care planning conversation included a discussion about:    The value and importance of advance care planning  Exploration of personal, cultural or spiritual beliefs that might influence medical decisions  Exploration of goals of care in the event of a sudden injury or illness  Identification and preparation of a healthcare agent  Review and updating or creation of an advance directive document.  3. Generalized weakness ongoing secondary to decompensation, therapy has stopped, discussed with Randy [name] appealed and waiting to hear decision, will continue PT/OT at [facility], in home PC, [place] in agreement  4. Cognitive impairment decline. secondary to progression of dementia, Parkinson; continue to discuss realistic expectations with progression, ongoing monitoring.   5. Palliative care encounter;  Palliative care encounter; Palliative medicine team will continue to support patient, patient's family, and medical team. Visit consisted of counseling and education dealing with the complex and emotionally intense issues of symptom management and palliative care in the setting of serious and potentially life-threatening illness  Initial Palliative Care Visit: Palliative care will continue to follow for complex medical decision making, advance care planning, and clarification of goals. Return 1 weeks or prn.  I spent 47 minutes providing this consultation starting at 2:30pm More than 50% of the time in this consultation was spent in counseling and care coordination. PPS: 40% Chief Complaint: Follow up palliative consult for complex medical decision making, address goals, manage ongoing symptoms  HISTORY OF PRESENT ILLNESS:  Randy [name] is a 87 y.o. year old male  with multiple medical problems including Lewy body dementia, parkinsonism followed by neurology, osteoarthritis, prostate cancer, essential hypertension. Hospitalization 07/28/2021 to 07/31/2021 ambulatory dysfunction with left hip pain secondary to multiple falls with severe DJD; diagnostics negative for acute fractures; AMS with advancement in Parkinson with Lewy body dementia; unable to care for self, d/c to STR at [facility] where he currently resides. Randy [name] requires assistance with transfers, mobility, adl's including bathing, dressing, tray set up. Staff endorses appetite has been fair. Randy [name] is at [facility] for therapy. Randy [name] is a full code. At present Randy [name] is sitting in a w/c in his room at [facility] with daughter Randy [name] present. We talked about purpose of PC, past medical history, ros, functional  and cognitive ability. Randy [name] talked about functional abilities prior to hospitalization where he was somewhat ambulatory, able to transfer, currently not able to ambulate or transfer. We talked about what skills Randy Cantara would need to perform including  transfers. We talked about appealing STR days. We talked about private pay for therapy at [facility] due to current state, Randy [name] endorses unable to currently care for him at home without being about to transfer or ambulate as she has to work. We talked about appetite which is good. Medical goals. We talked about f/u pc visit, 1 week. Therapeutic listening, emotional support provided. Questions answered.   History obtained from review of EMR, discussion with primary team, and interview with family, facility staff/caregiver with daughter, Randy [name] and Randy [name].  I reviewed available labs, medications, imaging, studies and related documents from the EMR.  Records reviewed and summarized above.   ROS 10 point system reviewed all negative except   Physical Exam: Constitutional: NAD General: frail appearing, elderly male EYES: lids intact ENMT: oral mucous membranes moist CV: S1S2, RRR Pulmonary: LCTA, no increased work of breathing, no cough, room air Abdomen: soft and non tender MSK: w/c dependent Skin: warm and dry Neuro:  + generalized weakness,  + cognitive impairment Psych: non-anxious affect, Alert, oriented to self, place Thank you for the opportunity to participate in the care of Randy [name].  The palliative care team will continue to follow. Please call our office at [phone] if we can be of additional assistance.    Z , NP   

## 2021-09-04 ENCOUNTER — Ambulatory Visit: Payer: Medicare Other | Admitting: Licensed Clinical Social Worker

## 2021-09-07 NOTE — Patient Instructions (Signed)
Visit Information  Thank you for taking time to visit with me today. Please don't hesitate to contact me if I can be of assistance to you before our next scheduled telephone appointment.  Following are the goals we discussed today:  Patient Self Care Activities:  Attend all scheduled provider appointments Call provider office for new concerns or questions Continue utilizing healthy coping strategies to manage stressors  Please call the care guide team at [phone] if you need to cancel or reschedule your appointment.   If you are experiencing a Mental Health or [place] or need someone to talk to, please call the Suicide and Crisis Lifeline: 988 call 911   Patient verbalizes understanding of instructions and care plan provided today and agrees to view in [place]. Active MyChart status and patient understanding of how to access instructions and care plan via MyChart confirmed with patient.     No further follow up required: Patient is placed at SNF. Family will contact PCP office if additional needs arise  Christa See, MSW, [email or web address] Phone [phone] 9:17 AM

## 2021-09-07 NOTE — Chronic Care Management (AMB) (Signed)
Care Management Clinical Social Work Note  09/07/2021 Name: Randy [name] MRN: [id number] DOB: 08-Oct-1934  Randy [name] is a 87 y.o. year old male who is a primary care patient of [name], DO.  The Care Management team was consulted for assistance with chronic disease management and coordination needs.  Engaged with patient's daughter, Randy [name], by telephone for follow up visit in response to provider referral for social work chronic care management and care coordination services  Consent to Services:  Mr. Wasko was given information about Care Management services today including:  Care Management services includes personalized support from designated clinical staff supervised by his physician, including individualized plan of care and coordination with other care providers 24/7 contact phone numbers for assistance for urgent and routine care needs. The patient may stop case management services at any time by phone call to the office staff.  Patient agreed to services and consent obtained.   Assessment: Review of patient past medical history, allergies, medications, and health status, including review of relevant consultants reports was performed today as part of a comprehensive evaluation and provision of chronic care management and care coordination services.  SDOH (Social Determinants of Health) assessments and interventions performed:    Advanced Directives Status: Not addressed in this encounter.  Care Plan  Allergies  Allergen Reactions   Antihistamines, Chlorpheniramine-Type Other (See Comments)    Can't urinate    Outpatient Encounter Medications as of 09/04/2021  Medication Sig Note   acetaminophen (TYLENOL) 500 MG tablet Take 1,000 mg by mouth every 6 (six) hours as needed for moderate pain.    carbidopa-levodopa (SINEMET CR) 50-200 MG tablet Take 1 tablet by mouth 4 (four) times daily. 07/28/2021: Last filled 06/17/21 90 day supply   cholecalciferol (VITAMIN D)  1000 units tablet Take 1,000 Units by mouth daily.    Coenzyme Q10 (CO Q 10) 100 MG CAPS Take 100 mg by mouth daily.     diclofenac Sodium (VOLTAREN) 1 % GEL APPLY 2 G TOPICALLY 4 (FOUR) TIMES DAILY AS NEEDED. TO KNEE ARTHRITIS PAIN (Patient not taking: Reported on 07/28/2021)    enalapril (VASOTEC) 10 MG tablet Take 1 tablet (10 mg total) by mouth 2 (two) times daily.    fluticasone (FLONASE) 50 MCG/ACT nasal spray Place 2 sprays into both nostrils as needed for allergies.    furosemide (LASIX) 20 MG tablet Take 1 tablet (20 mg total) by mouth every other day. (Patient taking differently: Take 20 mg by mouth every other day. Patient takes Mondays, Wednesdays, and Fridays.) 07/28/2021: Last filled 07/06/21 90 day supply   melatonin 5 MG TABS Take 1 tablet (5 mg total) by mouth at bedtime.    Multiple Vitamin (MULTIVITAMIN WITH MINERALS) TABS tablet Take 1 tablet by mouth daily.    Omega-3 Fatty Acids (FISH OIL) 1200 MG CAPS Take 1,200 mg by mouth in the morning, at noon, in the evening, and at bedtime.    omeprazole (PRILOSEC) 20 MG capsule Take 20 mg by mouth 2 (two) times daily.    rivastigmine (EXELON) 3 MG capsule Take 3 mg by mouth 2 (two) times daily. 07/28/2021: Last filled 06/01/21 90 day supply   sertraline (ZOLOFT) 50 MG tablet Take 50 mg by mouth daily. 07/28/2021: Last filled 06/17/21 90 day supply   traMADol (ULTRAM) 50 MG tablet Take 1 tablet (50 mg total) by mouth every 8 (eight) hours as needed.    vitamin B-12 1000 MCG tablet Take 1 tablet (1,000 mcg total) by mouth  daily.    vitamin C (ASCORBIC ACID) 500 MG tablet Take 500 mg by mouth daily.    No facility-administered encounter medications on file as of 09/04/2021.    Patient Active Problem List   Diagnosis Date Noted   Ambulatory dysfunction 07/28/2021   Hypertensive urgency 07/28/2021   History of prostate cancer 07/28/2021   Parkinsonism ([place]) 07/28/2021   Altered mental status 07/28/2021   Swelling of limb 12/26/2020    Bilateral primary osteoarthritis of knee 02/15/2020   Pressure injury of buttock, stage 1    Delirium    COVID-19 virus infection 10/08/2019   Unable to care for self 10/08/2019   Caregiver unable to cope 10/08/2019   Recurrent falls 10/08/2019   Lewy body dementia without behavioral disturbance ([place])    Prostate cancer ([place]) 06/30/2019   BPH with urinary obstruction 11/06/2018   Acute cholecystitis 09/08/2018   Myalgia due to statin 02/24/2018   Tricompartment osteoarthritis of left knee 11/14/2017   Generalized weakness 04/28/2017   Chronic low back pain 03/12/2017   Knee pain, chronic 10/01/2016   Chronic venous insufficiency 08/30/2016   BPH without obstruction/lower urinary tract symptoms 03/06/2016   Osteoarthritis of multiple joints 03/06/2016   Degenerative joint disease (DJD) of lumbar spine 03/06/2016   Essential hypertension 03/05/2016   Hyperlipidemia 03/05/2016   Osteoporosis 03/05/2016   GERD (gastroesophageal reflux disease) 03/05/2016   Bilateral lower extremity edema 03/05/2016   Dupuytren's disease of palm 05/28/2015    Conditions to be addressed/monitored: HTN, Dementia, Osteoporosis, and Osteoarthritis; Level of care concerns and ADL IADL limitations  Care Plan : General Social Work (Adult)  Updates made by Rebekah Chesterfield, LCSW since 09/07/2021 12:00 AM     Problem: Caregiver Stress      Long-Range Goal: Caregiver Coping Optimized for Daughter Completed 09/04/2021  Start Date: 04/11/2020  Expected End Date: 09/06/2021  Recent Progress: On track  Priority: Medium  Note:   Current Barriers:  Financial constraints Level of care concerns ADL IADL limitations Social Isolation Limited access to caregiver Memory Deficits Lacks knowledge of community resource: personal care service resources within the area that may benefit patient Lack of socialization  Clinical Social Work Clinical Goal(s):  Over the next 120 days, client will work with SW to address  concerns related to lack of care/support within the home Interventions: Patient's daughter, Randy [name], provided all information during visit Pt was placed at [facility] after recent hospitalization. Per daughter, pt is "doing exceptionally well" Family is interested in having an aid to assist with meds and ADLs upon discharge Pt was denied Medicaid end of July 2023 for being over income. LCSW encouraged family to f/up with [employer]) for free aid services Palliative Care continues to provide services to patient  CCM LCSW acknowledged caregiver stress Discussed plans with patient for ongoing care management follow up and provided patient with direct contact information for care management team Patient Self Care Activities:  Attend all scheduled provider appointments Call provider office for new concerns or questions Continue utilizing healthy coping strategies to manage stressors       Follow Up Plan: Pt is placed at SNF. LCSW encouraged family to contact PCP should additional resource needs arise  Christa See, MSW, [email or web address] Phone [phone] 9:16 AM

## 2021-09-13 ENCOUNTER — Other Ambulatory Visit: Payer: Self-pay

## 2021-09-13 ENCOUNTER — Encounter: Payer: Self-pay | Admitting: Gastroenterology

## 2021-09-13 ENCOUNTER — Ambulatory Visit: Payer: Medicare Other | Admitting: Gastroenterology

## 2021-09-13 VITALS — BP 133/82 | HR 72 | Temp 97.8°F | Ht 72.0 in | Wt 187.1 lb

## 2021-09-13 DIAGNOSIS — R1310 Dysphagia, unspecified: Secondary | ICD-10-CM

## 2021-09-13 NOTE — Progress Notes (Signed)
Cephas Darby, MD [address]  [place]  [place], [place] [address]  Main: [phone]  Fax: [phone]    Gastroenterology Consultation  Referring Provider:     [name] * Primary Care Physician:  [name], DO Primary Gastroenterologist:  Dr. Cephas Darby Reason for Consultation: Dysphagia        HPI:   Randy [name] is a 87 y.o. male referred by Dr. Parks Ranger, Devonne Doughty, DO  for consultation & management of dysphagia.  Patient has history of parkinsonism, Lewy body dementia, hypertension, hyperlipidemia, accompanied by his daughter today.  He reports that for last several months he has been experiencing intermittent difficulty swallowing to solids.  He does report intermittent heartburn.  He has been taking omeprazole 20 mg twice daily for almost a year.  He denies having an upper endoscopy before  Does not smoke or drink alcohol  NSAIDs: None  Antiplts/Anticoagulants/Anti thrombotics: None  GI Procedures: None  Past Medical History:  Diagnosis Date   Arthritis    GERD (gastroesophageal reflux disease)    Hyperlipidemia    Hypertension    Lewy body dementia ([place])    Osteoporosis    Prostate enlargement     Past Surgical History:  Procedure Laterality Date   CHOLECYSTECTOMY N/A 09/10/2018   Procedure: LAPAROSCOPIC CHOLECYSTECTOMY;  Surgeon: Benjamine Sprague, DO;  Location: [facility] ORS;  Service: General;  Laterality: N/A;   CYSTOSCOPY WITH LITHOLAPAXY N/A 10/20/2020   Procedure: CYSTOSCOPY WITH LITHOLAPAXY;  Surgeon: [name], MD;  Location: [facility] ORS;  Service: Urology;  Laterality: N/A;   GANGLION CYST EXCISION Left 2013   Left upper arm   HERNIA REPAIR N/A 2008   [place], ventral acquired hernia repair   HOLEP-LASER ENUCLEATION OF THE PROSTATE WITH MORCELLATION N/A 11/06/2018   Procedure: HOLEP-LASER ENUCLEATION OF THE PROSTATE WITH MORCELLATION;  Surgeon: [name], MD;  Location: [facility] ORS;  Service: Urology;   Laterality: N/A;   HOLEP-LASER ENUCLEATION OF THE PROSTATE WITH MORCELLATION N/A 10/20/2020   Procedure: HOLEP-LASER ENUCLEATION OF THE PROSTATE WITH MORCELLATION;  Surgeon: [name], MD;  Location: [facility] ORS;  Service: Urology;  Laterality: N/A;     Current Outpatient Medications:    acetaminophen (TYLENOL) 500 MG tablet, Take 1,000 mg by mouth every 6 (six) hours as needed for moderate pain., Disp: , Rfl:    carbidopa-levodopa (SINEMET CR) 50-200 MG tablet, Take 1 tablet by mouth 4 (four) times daily., Disp: , Rfl:    cholecalciferol (VITAMIN D) 1000 units tablet, Take 1,000 Units by mouth daily., Disp: , Rfl:    Coenzyme Q10 (CO Q 10) 100 MG CAPS, Take 100 mg by mouth daily. , Disp: , Rfl:    enalapril (VASOTEC) 5 MG tablet, Take 5 mg by mouth daily., Disp: , Rfl:    fluticasone (FLONASE) 50 MCG/ACT nasal spray, Place 2 sprays into both nostrils as needed for allergies., Disp: , Rfl:    furosemide (LASIX) 20 MG tablet, Take 1 tablet (20 mg total) by mouth every other day. (Patient taking differently: Take 20 mg by mouth every other day. Patient takes Mondays, Wednesdays, and Fridays.), Disp: 45 tablet, Rfl: 0   melatonin 5 MG TABS, Take 1 tablet (5 mg total) by mouth at bedtime., Disp: , Rfl: 0   Multiple Vitamin (MULTIVITAMIN WITH MINERALS) TABS tablet, Take 1 tablet by mouth daily., Disp: 30 tablet, Rfl: 0   Omega-3 Fatty Acids (FISH OIL) 1200 MG CAPS, Take 1,200 mg by  mouth in the morning, at noon, in the evening, and at bedtime., Disp: , Rfl:    omeprazole (PRILOSEC) 20 MG capsule, Take 20 mg by mouth 2 (two) times daily., Disp: , Rfl:    rivastigmine (EXELON) 3 MG capsule, Take 3 mg by mouth 2 (two) times daily., Disp: , Rfl:    sertraline (ZOLOFT) 50 MG tablet, Take 50 mg by mouth daily., Disp: , Rfl:    traMADol (ULTRAM) 50 MG tablet, Take 1 tablet (50 mg total) by mouth every 8 (eight) hours as needed., Disp: 30 tablet, Rfl: 2   vitamin B-12 1000 MCG tablet, Take 1 tablet  (1,000 mcg total) by mouth daily., Disp: , Rfl:    vitamin C (ASCORBIC ACID) 500 MG tablet, Take 500 mg by mouth daily., Disp: , Rfl:    diclofenac Sodium (VOLTAREN) 1 % GEL, APPLY 2 G TOPICALLY 4 (FOUR) TIMES DAILY AS NEEDED. TO KNEE ARTHRITIS PAIN (Patient not taking: Reported on 07/28/2021), Disp: 100 g, Rfl: 3   Family History  Problem Relation Age of Onset   Breast cancer Mother    Lung cancer Father    Heart disease Brother    Leukemia Brother    Prostate cancer Neg Hx    Colon cancer Neg Hx    Bladder Cancer Neg Hx    Kidney cancer Neg Hx      Social History   Tobacco Use   Smoking status: Former    Packs/day: 1.00    Years: 12.00    Total pack years: 12.00    Types: Cigarettes    Quit date: 1968    Years since quitting: 55.7   Smokeless tobacco: Never  Vaping Use   Vaping Use: Never used  Substance Use Topics   Alcohol use: No   Drug use: No    Allergies as of 09/13/2021 - Review Complete 09/13/2021  Allergen Reaction Noted   Antihistamines, chlorpheniramine-type Other (See Comments) 07/06/2012    Review of Systems:    All systems reviewed and negative except where noted in HPI.   Physical Exam:  BP 133/82 (BP Location: Left Arm, Patient Position: Sitting, Cuff Size: Normal)   Pulse 72   Temp 97.8 F (36.6 C) (Oral)   Ht 6' (1.829 m)   Wt 187 lb 2 oz (84.9 kg)   BMI 25.38 kg/m  No LMP for male patient.  General:   Alert,  Well-developed, well-nourished, pleasant and cooperative in NAD Head:  Normocephalic and atraumatic. Eyes:  Sclera clear, no icterus.   Conjunctiva pink. Ears:  Normal auditory acuity. Nose:  No deformity, discharge, or lesions. Mouth:  No deformity or lesions,oropharynx pink & moist. Neck:  Supple; no masses or thyromegaly. Lungs:  Respirations even and unlabored.  Clear throughout to auscultation.   No wheezes, crackles, or rhonchi. No acute distress. Heart:  Regular rate and rhythm; no murmurs, clicks, rubs, or  gallops. Abdomen:  Normal bowel sounds. Soft, non-tender and non-distended without masses, hepatosplenomegaly or hernias noted.  No guarding or rebound tenderness.   Rectal: Not performed Msk: Bilateral lower extremity weakness Pulses:  Normal pulses noted. Extremities:  No clubbing or edema.  No cyanosis. Neurologic:  Alert and oriented x3;  grossly normal neurologically. Skin:  Intact without significant lesions or rashes. No jaundice. Psych:  Alert and cooperative. Normal mood and affect.  Imaging Studies: No abdominal imaging  Assessment and Plan:   ASBERRY LASCOLA is a 87 y.o. patient male with history of parkinsonism, hypertension, hyperlipidemia, chronic  GERD is seen in consultation for intermittent dysphagia to solids and intermittent heartburn, currently maintained on omeprazole 20 mg p.o. twice daily  Recommend EGD for further evaluation, with esophageal biopsies Patient likely has esophageal dysmotility from parkinsonism Continue Prilosec 20 mg p.o. twice daily before meals   Follow up based on the EGD findings   Cephas Darby, MD

## 2021-10-05 ENCOUNTER — Ambulatory Visit (INDEPENDENT_AMBULATORY_CARE_PROVIDER_SITE_OTHER): Payer: Medicare Other

## 2021-10-05 VITALS — Ht 72.0 in | Wt 187.0 lb

## 2021-10-05 DIAGNOSIS — Z0000 Encounter for general adult medical examination without abnormal findings: Secondary | ICD-10-CM | POA: Diagnosis not present

## 2021-10-05 NOTE — Patient Instructions (Signed)
Randy [name] , Thank you for taking time to come for your Medicare Wellness Visit. I appreciate your ongoing commitment to your health goals. Please review the following plan we discussed and let me know if I can assist you in the future.   Screening recommendations/referrals: Colonoscopy: aged out Recommended yearly ophthalmology/optometry visit for glaucoma screening and checkup Recommended yearly dental visit for hygiene and checkup  Vaccinations: Influenza vaccine: 12/21/20 Pneumococcal vaccine: 10/31/17 Tdap vaccine: 03/05/16 Shingles vaccine: n/d   Covid-19: n/d  Advanced directives: no  Conditions/risks identified: none  Next appointment: Follow up in one year for your annual wellness visit. 10/11/22 @ 8:45 am by phone  Preventive Care 65 Years and Older, Male Preventive care refers to lifestyle choices and visits with your health care provider that can promote health and wellness. What does preventive care include? A yearly physical exam. This is also called an annual well check. Dental exams once or twice a year. Routine eye exams. Ask your health care provider how often you should have your eyes checked. Personal lifestyle choices, including: Daily care of your teeth and gums. Regular physical activity. Eating a healthy diet. Avoiding tobacco and drug use. Limiting alcohol use. Practicing safe sex. Taking low doses of aspirin every day. Taking vitamin and mineral supplements as recommended by your health care provider. What happens during an annual well check? The services and screenings done by your health care provider during your annual well check will depend on your age, overall health, lifestyle risk factors, and family history of disease. Counseling  Your health care provider may ask you questions about your: Alcohol use. Tobacco use. Drug use. Emotional well-being. Home and relationship well-being. Sexual activity. Eating habits. History of falls. Memory and  ability to understand (cognition). Work and work Statistician. Screening  You may have the following tests or measurements: Height, weight, and BMI. Blood pressure. Lipid and cholesterol levels. These may be checked every 5 years, or more frequently if you are over 40 years old. Skin check. Lung cancer screening. You may have this screening every year starting at age 69 if you have a 30-pack-year history of smoking and currently smoke or have quit within the past 15 years. Fecal occult blood test (FOBT) of the stool. You may have this test every year starting at age 34. Flexible sigmoidoscopy or colonoscopy. You may have a sigmoidoscopy every 5 years or a colonoscopy every 10 years starting at age 37. Prostate cancer screening. Recommendations will vary depending on your family history and other risks. Hepatitis C blood test. Hepatitis B blood test. Sexually transmitted disease (STD) testing. Diabetes screening. This is done by checking your blood sugar (glucose) after you have not eaten for a while (fasting). You may have this done every 1-3 years. Abdominal aortic aneurysm (AAA) screening. You may need this if you are a current or former smoker. Osteoporosis. You may be screened starting at age 74 if you are at high risk. Talk with your health care provider about your test results, treatment options, and if necessary, the need for more tests. Vaccines  Your health care provider may recommend certain vaccines, such as: Influenza vaccine. This is recommended every year. Tetanus, diphtheria, and acellular pertussis (Tdap, Td) vaccine. You may need a Td booster every 10 years. Zoster vaccine. You may need this after age 73. Pneumococcal 13-valent conjugate (PCV13) vaccine. One dose is recommended after age 80. Pneumococcal polysaccharide (PPSV23) vaccine. One dose is recommended after age 48. Talk to your health care provider  about which screenings and vaccines you need and how often you need  them. This information is not intended to replace advice given to you by your health care provider. Make sure you discuss any questions you have with your health care provider. Document Released: 01/20/2015 Document Revised: 09/13/2015 Document Reviewed: 10/25/2014 Elsevier Interactive Patient Education  2017 [place] Prevention in the Home Falls can cause injuries. They can happen to people of all ages. There are many things you can do to make your home safe and to help prevent falls. What can I do on the outside of my home? Regularly fix the edges of walkways and driveways and fix any cracks. Remove anything that might make you trip as you walk through a door, such as a raised step or threshold. Trim any bushes or trees on the path to your home. Use bright outdoor lighting. Clear any walking paths of anything that might make someone trip, such as rocks or tools. Regularly check to see if handrails are loose or broken. Make sure that both sides of any steps have handrails. Any raised decks and porches should have guardrails on the edges. Have any leaves, snow, or ice cleared regularly. Use sand or salt on walking paths during winter. Clean up any spills in your garage right away. This includes oil or grease spills. What can I do in the bathroom? Use night lights. Install grab bars by the toilet and in the tub and shower. Do not use towel bars as grab bars. Use non-skid mats or decals in the tub or shower. If you need to sit down in the shower, use a plastic, non-slip stool. Keep the floor dry. Clean up any water that spills on the floor as soon as it happens. Remove soap buildup in the tub or shower regularly. Attach bath mats securely with double-sided non-slip rug tape. Do not have throw rugs and other things on the floor that can make you trip. What can I do in the bedroom? Use night lights. Make sure that you have a light by your bed that is easy to reach. Do not use  any sheets or blankets that are too big for your bed. They should not hang down onto the floor. Have a firm chair that has side arms. You can use this for support while you get dressed. Do not have throw rugs and other things on the floor that can make you trip. What can I do in the kitchen? Clean up any spills right away. Avoid walking on wet floors. Keep items that you use a lot in easy-to-reach places. If you need to reach something above you, use a strong step stool that has a grab bar. Keep electrical cords out of the way. Do not use floor polish or wax that makes floors slippery. If you must use wax, use non-skid floor wax. Do not have throw rugs and other things on the floor that can make you trip. What can I do with my stairs? Do not leave any items on the stairs. Make sure that there are handrails on both sides of the stairs and use them. Fix handrails that are broken or loose. Make sure that handrails are as long as the stairways. Check any carpeting to make sure that it is firmly attached to the stairs. Fix any carpet that is loose or worn. Avoid having throw rugs at the top or bottom of the stairs. If you do have throw rugs, attach them to the floor  with carpet tape. Make sure that you have a light switch at the top of the stairs and the bottom of the stairs. If you do not have them, ask someone to add them for you. What else can I do to help prevent falls? Wear shoes that: Do not have high heels. Have rubber bottoms. Are comfortable and fit you well. Are closed at the toe. Do not wear sandals. If you use a stepladder: Make sure that it is fully opened. Do not climb a closed stepladder. Make sure that both sides of the stepladder are locked into place. Ask someone to hold it for you, if possible. Clearly mark and make sure that you can see: Any grab bars or handrails. First and last steps. Where the edge of each step is. Use tools that help you move around (mobility aids)  if they are needed. These include: Canes. Walkers. Scooters. Crutches. Turn on the lights when you go into a dark area. Replace any light bulbs as soon as they burn out. Set up your furniture so you have a clear path. Avoid moving your furniture around. If any of your floors are uneven, fix them. If there are any pets around you, be aware of where they are. Review your medicines with your doctor. Some medicines can make you feel dizzy. This can increase your chance of falling. Ask your doctor what other things that you can do to help prevent falls. This information is not intended to replace advice given to you by your health care provider. Make sure you discuss any questions you have with your health care provider. Document Released: 10/20/2008 Document Revised: 06/01/2015 Document Reviewed: 01/28/2014 Elsevier Interactive Patient Education  2017 [employer].

## 2021-10-05 NOTE — Progress Notes (Signed)
Virtual Visit via Telephone Note  I connected with  Randy [name] on 10/05/21 at  9:00 AM EDT by telephone and verified that I am speaking with the correct person using two identifiers. Also talked w/ daughter [name]  Location: Patient: home Provider: [facility] Persons participating in the virtual visit: [place]   I discussed the limitations, risks, security and privacy concerns of performing an evaluation and management service by telephone and the availability of in person appointments. The patient expressed understanding and agreed to proceed.  Interactive audio and video telecommunications were attempted between this nurse and patient, however failed, due to patient having technical difficulties OR patient did not have access to video capability.  We continued and completed visit with audio only.  Some vital signs may be absent or patient reported.   Dionisio David, LPN  Subjective:   Randy [name] is a 87 y.o. male who presents for Medicare Annual/Subsequent preventive examination.  Review of Systems     Cardiac Risk Factors include: advanced age (>52mn, >>30women);hypertension;male gender;sedentary lifestyle     Objective:    There were no vitals filed for this visit. There is no height or weight on file to calculate BMI.     10/05/2021    9:00 AM 07/29/2021   11:11 AM 07/28/2021    1:15 PM 10/20/2020    6:34 AM 10/16/2020    1:47 PM 07/25/2020    2:45 PM 05/09/2020    1:18 PM  Advanced Directives  Does Patient Have a Medical Advance Directive? No No Yes Yes Yes Yes Yes  Type of [place]erson[place] officerof [place] will Living will [place] will Living will;Healthcare Power of Attorney  Does patient want to make changes to medical advance directive?    No - Patient declined   No - Guardian declined  Copy of [place] Chart?    No - copy requested  No - copy  requested No - copy requested  Would patient like information on creating a medical advance directive? No - Patient declined No - Patient declined No - Patient declined        Current Medications (verified) Outpatient Encounter Medications as of 10/05/2021  Medication Sig   acetaminophen (TYLENOL) 500 MG tablet Take 1,000 mg by mouth every 6 (six) hours as needed for moderate pain.   carbidopa-levodopa (SINEMET CR) 50-200 MG tablet Take 1 tablet by mouth 4 (four) times daily.   cholecalciferol (VITAMIN D) 1000 units tablet Take 1,000 Units by mouth daily.   Coenzyme Q10 (CO Q 10) 100 MG CAPS Take 100 mg by mouth daily.    diclofenac Sodium (VOLTAREN) 1 % GEL APPLY 2 G TOPICALLY 4 (FOUR) TIMES DAILY AS NEEDED. TO KNEE ARTHRITIS PAIN   enalapril (VASOTEC) 5 MG tablet Take 5 mg by mouth daily.   fluticasone (FLONASE) 50 MCG/ACT nasal spray Place 2 sprays into both nostrils as needed for allergies.   furosemide (LASIX) 20 MG tablet Take 1 tablet (20 mg total) by mouth every other day. (Patient taking differently: Take 20 mg by mouth every other day. Patient takes Mondays, Wednesdays, and Fridays.)   melatonin 5 MG TABS Take 1 tablet (5 mg total) by mouth at bedtime.   Multiple Vitamin (MULTIVITAMIN WITH MINERALS) TABS tablet Take 1 tablet by mouth daily.   Omega-3 Fatty Acids (FISH OIL) 1200 MG CAPS Take 1,200 mg by mouth in the morning, at  noon, in the evening, and at bedtime.   omeprazole (PRILOSEC) 20 MG capsule Take 20 mg by mouth 2 (two) times daily.   rivastigmine (EXELON) 3 MG capsule Take 3 mg by mouth 2 (two) times daily.   sertraline (ZOLOFT) 50 MG tablet Take 50 mg by mouth daily.   traMADol (ULTRAM) 50 MG tablet Take 1 tablet (50 mg total) by mouth every 8 (eight) hours as needed.   vitamin B-12 1000 MCG tablet Take 1 tablet (1,000 mcg total) by mouth daily.   vitamin C (ASCORBIC ACID) 500 MG tablet Take 500 mg by mouth daily.   No facility-administered encounter medications on file  as of 10/05/2021.    Allergies (verified) Antihistamines, chlorpheniramine-type   History: Past Medical History:  Diagnosis Date   Arthritis    GERD (gastroesophageal reflux disease)    Hyperlipidemia    Hypertension    Lewy body dementia ([place])    Osteoporosis    Prostate enlargement    Past Surgical History:  Procedure Laterality Date   CHOLECYSTECTOMY N/A 09/10/2018   Procedure: LAPAROSCOPIC CHOLECYSTECTOMY;  Surgeon: Benjamine Sprague, DO;  Location: [facility] ORS;  Service: General;  Laterality: N/A;   CYSTOSCOPY WITH LITHOLAPAXY N/A 10/20/2020   Procedure: CYSTOSCOPY WITH LITHOLAPAXY;  Surgeon: [name], MD;  Location: [facility] ORS;  Service: Urology;  Laterality: N/A;   GANGLION CYST EXCISION Left 2013   Left upper arm   HERNIA REPAIR N/A 2008   [place], ventral acquired hernia repair   HOLEP-LASER ENUCLEATION OF THE PROSTATE WITH MORCELLATION N/A 11/06/2018   Procedure: HOLEP-LASER ENUCLEATION OF THE PROSTATE WITH MORCELLATION;  Surgeon: [name], MD;  Location: [facility] ORS;  Service: Urology;  Laterality: N/A;   HOLEP-LASER ENUCLEATION OF THE PROSTATE WITH MORCELLATION N/A 10/20/2020   Procedure: HOLEP-LASER ENUCLEATION OF THE PROSTATE WITH MORCELLATION;  Surgeon: [name], MD;  Location: [facility];  Service: Urology;  Laterality: N/A;   Family History  Problem Relation Age of Onset   Breast cancer Mother    Lung cancer Father    Heart disease Brother    Leukemia Brother    Prostate cancer Neg Hx    Colon cancer Neg Hx    Bladder Cancer Neg Hx    Kidney cancer Neg Hx    Social History   Socioeconomic History   Marital status: Married    Spouse name: Not on file   Number of children: Not on file   Years of education: College   Highest education level: Not on file  Occupational History   Occupation: Retired Clinical biochemist  Tobacco Use   Smoking status: Former    Packs/day: 1.00    Years: 12.00    Total pack years: 12.00    Types: Cigarettes    Quit  date: 1968    Years since quitting: 55.7   Smokeless tobacco: Never  Vaping Use   Vaping Use: Never used  Substance and Sexual Activity   Alcohol use: No   Drug use: No   Sexual activity: Not Currently  Other Topics Concern   Not on file  Social History Narrative   Not on file   Social Determinants of Health   Financial Resource Strain: Low Risk  (10/05/2021)   Overall Financial Resource Strain (CARDIA)    Difficulty of Paying Living Expenses: Not hard at all  Food Insecurity: No Food Insecurity (10/05/2021)   Hunger Vital Sign    Worried About Running Out of Food in the Last Year: Never true  Ran Out of Food in the Last Year: Never true  Transportation Needs: No Transportation Needs (10/05/2021)   PRAPARE - Hydrologist (Medical): No    Lack of Transportation (Non-Medical): No  Physical Activity: Inactive (10/05/2021)   Exercise Vital Sign    Days of Exercise per Week: 0 days    Minutes of Exercise per Session: 0 min  Stress: No Stress Concern Present (10/05/2021)   [place]    Feeling of Stress : Only a little  Social Connections: Moderately Isolated (10/05/2021)   Social Connection and Isolation Panel [NHANES]    Frequency of Communication with Friends and Family: Once a week    Frequency of Social Gatherings with Friends and Family: More than three times a week    Attends Religious Services: Never    Marine scientist or Organizations: No    Attends Music therapist: Never    Marital Status: Married    Tobacco Counseling Counseling given: Not Answered   Clinical Intake:  Pre-visit preparation completed: No  Pain : No/denies pain     Nutritional Risks: None Diabetes: No  How often do you need to have someone help you when you read instructions, pamphlets, or other written materials from your doctor or pharmacy?: 1 -  Never  Diabetic?no  Interpreter Needed?: No  Information entered by :: Kirke Shaggy, LPN   Activities of Daily Living    10/05/2021    9:02 AM 07/29/2021   11:13 AM  In your present state of health, do you have any difficulty performing the following activities:  Hearing? 1   Vision? 0   Difficulty concentrating or making decisions? 1   Walking or climbing stairs? 1   Dressing or bathing? 0   Doing errands, shopping? 1 0  Preparing Food and eating ? N   Using the Toilet? Y   In the past six months, have you accidently leaked urine? N   Do you have problems with loss of bowel control? N   Managing your Medications? Y   Managing your Finances? Y   Housekeeping or managing your Housekeeping? Y     Patient Care Team: [name], DO as PCP - General (Family Medicine) Kate Sable, MD as PCP - Cardiology (Cardiology) Curley Spice Virl Diamond, [place] as Pharmacist Vanita Ingles, RN as Case Manager Tamala Julian Jonette Eva, NP as Nurse Practitioner (Hospice and Palliative Medicine) Vladimir Crofts, MD as Consulting Physician (Neurology) Kerry Dory, PA-C (Physician Assistant)  Indicate any recent Medical Services you may have received from other than [facility] providers in the past year (date may be approximate).     Assessment:   This is a routine wellness examination for Randy [name].  Hearing/Vision screen Hearing Screening - Comments:: No aids Vision Screening - Comments:: Wears glasses- Dr.Woodard  Dietary issues and exercise activities discussed: Current Exercise Habits: The patient does not participate in regular exercise at present   Goals Addressed             This Visit's Progress    DIET - EAT MORE FRUITS AND VEGETABLES         Depression Screen    10/05/2021    8:59 AM 07/25/2020    2:46 PM 03/02/2019    1:37 PM 01/22/2019    3:26 PM 09/21/2018    1:24 PM 09/16/2018    2:33 PM 06/17/2018    2:08 PM  PHQ 2/9 Scores  PHQ - 2 Score 0 0 0 0 0 0 0   PHQ- 9 Score 0          Fall Risk    10/05/2021    9:01 AM 12/05/2020    8:50 AM 07/25/2020    2:45 PM 03/02/2019    1:37 PM 01/22/2019    3:26 PM  [place] in the past year? 1 1 0 0 0  Number falls in past yr: 1 0  0 0  Injury with Fall? 1 0  0 0  Risk for fall due to : History of fall(s) History of fall(s) Medication side effect    Follow up Falls evaluation completed;Falls prevention discussed Falls evaluation completed Falls evaluation completed;Education provided;Falls prevention discussed  Falls evaluation completed    FALL RISK PREVENTION PERTAINING TO THE HOME:  Any stairs in or around the home? No  If so, are there any without handrails? No  Home free of loose throw rugs in walkways, pet beds, electrical cords, etc? Yes  Adequate lighting in your home to reduce risk of falls? Yes   ASSISTIVE DEVICES UTILIZED TO PREVENT FALLS:  Life alert? No  Use of a cane, walker or w/c? Yes  Grab bars in the bathroom? Yes  Shower chair or bench in shower? Yes  Elevated toilet seat or a handicapped toilet? Yes    Cognitive Function:unavailable        02/24/2018    1:46 PM 11/26/2016    2:29 PM  6CIT Screen  What Year? 0 points 0 points  What month? 0 points 0 points  What time? 0 points 0 points  Count back from 20 0 points 0 points  Months in reverse 0 points 2 points  Repeat phrase 2 points 6 points  Total Score 2 points 8 points    Immunizations Immunization History  Administered Date(s) Administered   Fluad Quad(high Dose 65+) 11/07/2018, 11/10/2019, 12/21/2020   Influenza, High Dose Seasonal PF 10/01/2016, 10/07/2017   Pneumococcal Conjugate-13 09/11/2016   Pneumococcal Polysaccharide-23 10/31/2017   Tdap 03/05/2016    TDAP status: Up to date  Flu Vaccine status: Up to date  Pneumococcal vaccine status: Up to date  Covid-19 vaccine status: Declined, Education has been provided regarding the importance of this vaccine but patient still declined.  Advised may receive this vaccine at local pharmacy or Health Dept.or vaccine clinic. Aware to provide a copy of the vaccination record if obtained from local pharmacy or Health Dept. Verbalized acceptance and understanding.  Qualifies for Shingles Vaccine? Yes   Zostavax completed No   Shingrix Completed?: No.    Education has been provided regarding the importance of this vaccine. Patient has been advised to call insurance company to determine out of pocket expense if they have not yet received this vaccine. Advised may also receive vaccine at local pharmacy or Health Dept. Verbalized acceptance and understanding.  Screening Tests Health Maintenance  Topic Date Due   COVID-19 Vaccine (1) Never done   Zoster Vaccines- Shingrix (1 of 2) Never done   INFLUENZA VACCINE  08/07/2021   TETANUS/TDAP  03/05/2026   Pneumonia Vaccine 47+ Years old  Completed   HPV VACCINES  Aged Out    Health Maintenance  Health Maintenance Due  Topic Date Due   COVID-19 Vaccine (1) Never done   Zoster Vaccines- Shingrix (1 of 2) Never done   INFLUENZA VACCINE  08/07/2021    Colorectal cancer screening: No longer required.   Lung Cancer  Screening: (Low Dose CT Chest recommended if Age 80-80 years, 30 pack-year currently smoking OR have quit w/in 15years.) does not qualify.   Additional Screening:  Hepatitis C Screening: does not qualify; Completed no  Vision Screening: Recommended annual ophthalmology exams for early detection of glaucoma and other disorders of the eye. Is the patient up to date with their annual eye exam?  Yes  Who is the provider or what is the name of the office in which the patient attends annual eye exams? Dr.Woodard  If pt is not established with a provider, would they like to be referred to a provider to establish care? No .   Dental Screening: Recommended annual dental exams for proper oral hygiene  Community Resource Referral / Chronic Care Management: CRR required this  visit?  No   CCM required this visit?  No      Plan:     I have personally reviewed and noted the following in the patient's chart:   Medical and social history Use of alcohol, tobacco or illicit drugs  Current medications and supplements including opioid prescriptions. Patient is not currently taking opioid prescriptions. Functional ability and status Nutritional status Physical activity Advanced directives List of other physicians Hospitalizations, surgeries, and ER visits in previous 12 months Vitals Screenings to include cognitive, depression, and falls Referrals and appointments  In addition, I have reviewed and discussed with patient certain preventive protocols, quality metrics, and best practice recommendations. A written personalized care plan for preventive services as well as general preventive health recommendations were provided to patient.     Dionisio David, LPN   09/08/1113   Nurse Notes: none

## 2021-10-11 ENCOUNTER — Ambulatory Visit: Payer: Medicare Other | Admitting: Anesthesiology

## 2021-10-11 ENCOUNTER — Ambulatory Visit
Admission: RE | Admit: 2021-10-11 | Discharge: 2021-10-11 | Disposition: A | Discharge status: Home / Self Care | Payer: Medicare Other | Attending: Gastroenterology | Admitting: Gastroenterology

## 2021-10-11 ENCOUNTER — Telehealth: Payer: Self-pay | Admitting: Family Medicine

## 2021-10-11 ENCOUNTER — Encounter: Payer: Self-pay | Admitting: Gastroenterology

## 2021-10-11 ENCOUNTER — Encounter
Admission: RE | Disposition: A | Discharge status: Home / Self Care | Payer: Self-pay | Source: Home / Self Care | Attending: Gastroenterology

## 2021-10-11 DIAGNOSIS — G20A1 Parkinson's disease without dyskinesia, without mention of fluctuations: Secondary | ICD-10-CM | POA: Insufficient documentation

## 2021-10-11 DIAGNOSIS — Z79899 Other long term (current) drug therapy: Secondary | ICD-10-CM | POA: Insufficient documentation

## 2021-10-11 DIAGNOSIS — I10 Essential (primary) hypertension: Secondary | ICD-10-CM | POA: Diagnosis not present

## 2021-10-11 DIAGNOSIS — K449 Diaphragmatic hernia without obstruction or gangrene: Secondary | ICD-10-CM

## 2021-10-11 DIAGNOSIS — R1310 Dysphagia, unspecified: Secondary | ICD-10-CM | POA: Diagnosis not present

## 2021-10-11 DIAGNOSIS — F0280 Dementia in other diseases classified elsewhere without behavioral disturbance: Secondary | ICD-10-CM | POA: Diagnosis not present

## 2021-10-11 DIAGNOSIS — E785 Hyperlipidemia, unspecified: Secondary | ICD-10-CM | POA: Insufficient documentation

## 2021-10-11 DIAGNOSIS — Z87891 Personal history of nicotine dependence: Secondary | ICD-10-CM | POA: Insufficient documentation

## 2021-10-11 DIAGNOSIS — K219 Gastro-esophageal reflux disease without esophagitis: Secondary | ICD-10-CM | POA: Diagnosis not present

## 2021-10-11 DIAGNOSIS — R1314 Dysphagia, pharyngoesophageal phase: Secondary | ICD-10-CM | POA: Insufficient documentation

## 2021-10-11 HISTORY — PX: ESOPHAGOGASTRODUODENOSCOPY (EGD) WITH PROPOFOL: SHX5813

## 2021-10-11 SURGERY — ESOPHAGOGASTRODUODENOSCOPY (EGD) WITH PROPOFOL
Anesthesia: General

## 2021-10-11 MED ORDER — PROPOFOL 10 MG/ML IV BOLUS
INTRAVENOUS | Status: DC | PRN
  Administered 2021-10-11 (×2): 50 mg via INTRAVENOUS
  Administered 2021-10-11: 90 mg via INTRAVENOUS

## 2021-10-11 MED ORDER — LIDOCAINE HCL (CARDIAC) PF 100 MG/5ML IV SOSY
PREFILLED_SYRINGE | INTRAVENOUS | Status: DC | PRN
  Administered 2021-10-11: 80 mg via INTRAVENOUS

## 2021-10-11 MED ORDER — SODIUM CHLORIDE 0.9 % IV SOLN: INTRAVENOUS | Status: DC

## 2021-10-11 NOTE — Anesthesia Postprocedure Evaluation (Signed)
Anesthesia Post Note  Patient: Randy [name]  Procedure(s) Performed: ESOPHAGOGASTRODUODENOSCOPY (EGD) WITH PROPOFOL  Patient location during evaluation: PACU Anesthesia Type: General Level of consciousness: awake and oriented Pain management: satisfactory to patient Respiratory status: spontaneous breathing and respiratory function stable Cardiovascular status: stable Anesthetic complications: no   No notable events documented.   Last Vitals:  Vitals:   10/11/21 1117 10/11/21 1127  BP: (!) 154/89 (!) 172/91  Pulse: 63 61  Resp: 13   Temp:    SpO2: 95% 95%    Last Pain:  Vitals:   10/11/21 1127  TempSrc:   PainSc: 0-No pain                 VAN STAVEREN,Devony Mcgrady

## 2021-10-11 NOTE — H&P (Signed)
Cephas Darby, MD [address]  [place]  [place], [place] [address]  Main: [phone]  Fax: [phone] Pager: [phone]  Primary Care Physician:  [name], DO Primary Gastroenterologist:  Dr. Cephas Darby  Pre-Procedure History & Physical: HPI:  Randy [name] is a 87 y.o. male is here for an endoscopy.   Past Medical History:  Diagnosis Date   Arthritis    GERD (gastroesophageal reflux disease)    Hyperlipidemia    Hypertension    Lewy body dementia ([place])    Osteoporosis    Prostate enlargement     Past Surgical History:  Procedure Laterality Date   CHOLECYSTECTOMY N/A 09/10/2018   Procedure: LAPAROSCOPIC CHOLECYSTECTOMY;  Surgeon: Benjamine Sprague, DO;  Location: [facility] ORS;  Service: General;  Laterality: N/A;   CYSTOSCOPY WITH LITHOLAPAXY N/A 10/20/2020   Procedure: CYSTOSCOPY WITH LITHOLAPAXY;  Surgeon: [name], MD;  Location: [facility] ORS;  Service: Urology;  Laterality: N/A;   GANGLION CYST EXCISION Left 2013   Left upper arm   HERNIA REPAIR N/A 2008   MIdline, ventral acquired hernia repair   HOLEP-LASER ENUCLEATION OF THE PROSTATE WITH MORCELLATION N/A 11/06/2018   Procedure: HOLEP-LASER ENUCLEATION OF THE PROSTATE WITH MORCELLATION;  Surgeon: [name], MD;  Location: [facility] ORS;  Service: Urology;  Laterality: N/A;   HOLEP-LASER ENUCLEATION OF THE PROSTATE WITH MORCELLATION N/A 10/20/2020   Procedure: HOLEP-LASER ENUCLEATION OF THE PROSTATE WITH MORCELLATION;  Surgeon: [name], MD;  Location: [facility] OR[facility];  Service: Urology;  Laterality: N/A;    Prior to Admission medications   Medication Sig Start Date End Date Taking? Authorizing Provider  carbidopa-levodopa (SINEMET CR) 50-200 MG tablet Take 1 tablet by mouth 4 (four) times daily. 06/22/20  Yes [provider]  cholecalciferol (VITAMIN D) 1000 units tablet Take 1,000 Units by mouth daily.   Yes [provider]  Coenzyme Q10 (CO Q 10) 100 MG CAPS Take  100 mg by mouth daily.    Yes [provider]  enalapril (VASOTEC) 5 MG tablet Take 5 mg by mouth daily. 08/30/21  Yes [provider]  furosemide (LASIX) 20 MG tablet Take 1 tablet (20 mg total) by mouth every other day. Patient taking differently: Take 20 mg by mouth every other day. Patient takes Mondays, Wednesdays, and Fridays. 07/06/21  Yes Agbor-Etang, Aaron Edelman, MD  omeprazole (PRILOSEC) 20 MG capsule Take 20 mg by mouth 2 (two) times daily.   Yes [provider]  traMADol (ULTRAM) 50 MG tablet Take 1 tablet (50 mg total) by mouth every 8 (eight) hours as needed. 05/05/20  Yes Karamalegos, Devonne Doughty, DO  vitamin B-12 1000 MCG tablet Take 1 tablet (1,000 mcg total) by mouth daily. 08/01/21  Yes Lorella Nimrod, MD  acetaminophen (TYLENOL) 500 MG tablet Take 1,000 mg by mouth every 6 (six) hours as needed for moderate pain.    [provider]  diclofenac Sodium (VOLTAREN) 1 % GEL APPLY 2 G TOPICALLY 4 (FOUR) TIMES DAILY AS NEEDED. TO KNEE ARTHRITIS PAIN 09/28/20   [name], DO  fluticasone [facility]) 50 MCG/ACT nasal spray Place 2 sprays into both nostrils as needed for allergies.    [provider]  melatonin 5 MG TABS Take 1 tablet (5 mg total) by mouth at bedtime. 07/31/21   Lorella Nimrod, MD  Multiple Vitamin (MULTIVITAMIN WITH MINERALS) TABS tablet Take 1 tablet by mouth daily. 10/12/19   [name], MD  Omega-3 Fatty Acids (FISH OIL) 1200 MG  CAPS Take 1,200 mg by mouth in the morning, at noon, in the evening, and at bedtime.    [provider]  rivastigmine (EXELON) 3 MG capsule Take 3 mg by mouth 2 (two) times daily.    [provider]  sertraline (ZOLOFT) 50 MG tablet Take 50 mg by mouth daily. 06/22/20   [provider]  vitamin C (ASCORBIC ACID) 500 MG tablet Take 500 mg by mouth daily.    [provider]    Allergies as of 09/13/2021 - Review Complete 09/13/2021  Allergen Reaction Noted    Antihistamines, chlorpheniramine-type Other (See Comments) 07/06/2012    Family History  Problem Relation Age of Onset   Breast cancer Mother    Lung cancer Father    Heart disease Brother    Leukemia Brother    Prostate cancer Neg Hx    Colon cancer Neg Hx    Bladder Cancer Neg Hx    Kidney cancer Neg Hx     Social History   Socioeconomic History   Marital status: Married    Spouse name: Not on file   Number of children: Not on file   Years of education: College   Highest education level: Not on file  Occupational History   Occupation: Retired Clinical biochemist  Tobacco Use   Smoking status: Former    Packs/day: 1.00    Years: 12.00    Total pack years: 12.00    Types: Cigarettes    Quit date: 1968    Years since quitting: 55.7   Smokeless tobacco: Never  Vaping Use   Vaping Use: Never used  Substance and Sexual Activity   Alcohol use: No   Drug use: No   Sexual activity: Not Currently  Other Topics Concern   Not on file  Social History Narrative   Not on file   Social Determinants of Health   Financial Resource Strain: Low Risk  (10/05/2021)   Overall Financial Resource Strain (CARDIA)    Difficulty of Paying Living Expenses: Not hard at all  Food Insecurity: No Food Insecurity (10/05/2021)   Hunger Vital Sign    Worried About Running Out of Food in the Last Year: Never true    [place] in the Last Year: Never true  Transportation Needs: No Transportation Needs (10/05/2021)   PRAPARE - Hydrologist (Medical): No    Lack of Transportation (Non-Medical): No  Physical Activity: Inactive (10/05/2021)   Exercise Vital Sign    Days of Exercise per Week: 0 days    Minutes of Exercise per Session: 0 min  Stress: No Stress Concern Present (10/05/2021)   [place]    Feeling of Stress : Only a little  Social Connections: Moderately Isolated (10/05/2021)   Social  Connection and Isolation Panel [NHANES]    Frequency of Communication with Friends and Family: Once a week    Frequency of Social Gatherings with Friends and Family: More than three times a week    Attends Religious Services: Never    Marine scientist or Organizations: No    Attends Archivist Meetings: Never    Marital Status: Married  Human resources officer Violence: Not At Risk (10/05/2021)   Humiliation, Afraid, Rape, and Kick questionnaire    Fear of Current or Ex-Partner: No    Emotionally Abused: No    Physically Abused: No    Sexually Abused: No    Review  of Systems: See HPI, otherwise negative ROS  Physical Exam: BP (!) 145/80   Pulse 64   Temp (!) 96.6 F (35.9 C) (Temporal)   Resp 20   Ht 6' (1.829 m)   Wt 85.3 kg   SpO2 100%   BMI 25.50 kg/m  General:   Alert,  pleasant and cooperative in NAD Head:  Normocephalic and atraumatic. Neck:  Supple; no masses or thyromegaly. Lungs:  Clear throughout to auscultation.    Heart:  Regular rate and rhythm. Abdomen:  Soft, nontender and nondistended. Normal bowel sounds, without guarding, and without rebound.   Neurologic:  Alert and  oriented x4;  grossly normal neurologically.  Impression/Plan: Randy [name] is here for an endoscopy to be performed for dysphagia  Risks, benefits, limitations, and alternatives regarding  endoscopy have been reviewed with the patient.  Questions have been answered.  All parties agreeable.   Sherri Sear, MD  10/11/2021, 10:46 AM

## 2021-10-11 NOTE — Telephone Encounter (Signed)
Yes I can follow for [place] orders, as long as patient follows up with [place] for visit to make arrangements and document.  Randy [name], [facility] 10/11/2021, 1:55 PM

## 2021-10-11 NOTE — Op Note (Signed)
[facility] Gastroenterology Patient Name: Randy [name] Procedure Date: 10/11/2021 10:47 AM MRN: [id number] Account #: [id number] Date of Birth: 1934-12-03 Admit Type: Outpatient Age: 87 Room: [facility] ENDO ROOM 4 Gender: Male Note Status: Finalized Instrument Name: Upper Endoscope [id number] Procedure:             Upper GI endoscopy Indications:           Esophageal dysphagia Providers:             Lin Landsman MD, MD Referring MD:          [name] (Referring MD) Medicines:             General Anesthesia Complications:         No immediate complications. Estimated blood loss: None. Procedure:             Pre-Anesthesia Assessment:                        - Prior to the procedure, a History and Physical was                         performed, and patient medications and allergies were                         reviewed. The patient is unable to give consent                         secondary to the patient being legally incompetent to                         consent. The risks and benefits of the procedure and                         the sedation options and risks were discussed with the                         patient's daughter. All questions were answered and                         informed consent was obtained. Patient identification                         and proposed procedure were verified by the physician,                         the nurse, the anesthesiologist, the anesthetist and                         the technician in the pre-procedure area in the                         procedure room in the endoscopy suite. Mental Status                         Examination: dementia. Airway Examination: normal                         oropharyngeal airway and neck mobility. Respiratory  Examination: clear to auscultation. CV Examination:                         normal. Prophylactic Antibiotics: The patient does not                          require prophylactic antibiotics. Prior                         Anticoagulants: The patient has taken no previous                         anticoagulant or antiplatelet agents. ASA Grade                         Assessment: III - A patient with severe systemic                         disease. After reviewing the risks and benefits, the                         patient was deemed in satisfactory condition to                         undergo the procedure. The anesthesia plan was to use                         general anesthesia. Immediately prior to                         administration of medications, the patient was                         re-assessed for adequacy to receive sedatives. The                         heart rate, respiratory rate, oxygen saturations,                         blood pressure, adequacy of pulmonary ventilation, and                         response to care were monitored throughout the                         procedure. The physical status of the patient was                         re-assessed after the procedure.                        After obtaining informed consent, the endoscope was                         passed under direct vision. Throughout the procedure,                         the patient's blood pressure, pulse, and oxygen  saturations were monitored continuously. The Endoscope                         was introduced through the mouth, and advanced to the                         second part of duodenum. The upper GI endoscopy was                         accomplished without difficulty. The patient tolerated                         the procedure well. Findings:      The duodenal bulb and second portion of the duodenum were normal.      The entire examined stomach was normal.      A medium-sized hiatal hernia was present.      The cardia and gastric fundus were normal on retroflexion.      The gastroesophageal junction and  examined esophagus were normal. Impression:            - Normal duodenal bulb and second portion of the                         duodenum.                        - Normal stomach.                        - Medium-sized hiatal hernia.                        - Normal gastroesophageal junction and esophagus.                        - No specimens collected. Recommendation:        - Discharge patient to home (with escort).                        - Resume previous diet today.                        - Continue present medications.                        - Use Prilosec (omeprazole) 20 mg PO BID indefinitely.                        - Small portion meals                        - Follow an antireflux regimen indefinitely. Procedure Code(s):     --- Professional ---                        [phone], Esophagogastroduodenoscopy, flexible,                         transoral; diagnostic, including collection of                         specimen(s) by brushing  or washing, when performed                         (separate procedure) Diagnosis Code(s):     --- Professional ---                        K44.9, Diaphragmatic hernia without obstruction or                         gangrene                        R13.14, Dysphagia, pharyngoesophageal phase CPT copyright 2019 American Medical Association. All rights reserved. The codes documented in this report are preliminary and upon coder review may  be revised to meet current compliance requirements. Dr. Ulyess Mort Lin Landsman MD, MD 10/11/2021 11:06:53 AM This report has been signed electronically. Number of Addenda: 0 Note Initiated On: 10/11/2021 10:47 AM Estimated Blood Loss:  Estimated blood loss: none.      [facility]

## 2021-10-11 NOTE — Transfer of Care (Signed)
Immediate Anesthesia Transfer of Care Note  Patient: Trusten [place] Duplechain  Procedure(s) Performed: ESOPHAGOGASTRODUODENOSCOPY (EGD) WITH PROPOFOL  Patient Location: Endoscopy Unit  Anesthesia Type:General  Level of Consciousness: drowsy  Airway & Oxygen Therapy: Patient Spontanous Breathing and Patient connected to nasal cannula oxygen  Post-op Assessment: Report given to RN, Post -op Vital signs reviewed and stable and Patient moving all extremities  Post vital signs: Reviewed and stable  Last Vitals:  Vitals Value Taken Time  BP 140/78 10/11/21 1107  Temp 36.4 C 10/11/21 1107  Pulse 64 10/11/21 1107  Resp 22 10/11/21 1107  SpO2 92 % 10/11/21 1107    Last Pain:  Vitals:   10/11/21 1107  TempSrc: Temporal  PainSc: Asleep         Complications: No notable events documented.

## 2021-10-11 NOTE — Telephone Encounter (Signed)
Kelsey with [facility] called asking if Dr. [name] will follow for [facility] orders//  CB@ [phone]

## 2021-10-11 NOTE — Anesthesia Preprocedure Evaluation (Signed)
Anesthesia Evaluation  Patient identified by MRN, date of birth, ID band Patient awake and Patient confused    Reviewed: Allergy & Precautions, NPO status , Patient's Chart, lab work & pertinent test results  Airway Mallampati: II  TM Distance: >3 FB Neck ROM: Full    Dental  (+) Chipped,    Pulmonary neg pulmonary ROS, Patient abstained from smoking., former smoker,    Pulmonary exam normal  + decreased breath sounds      Cardiovascular Exercise Tolerance: Poor hypertension, Pt. on medications negative cardio ROS Normal cardiovascular exam Rhythm:Regular Rate:Normal     Neuro/Psych Dementia Hx of Parkinson's and Lewie Body dementis negative neurological ROS  negative psych ROS   GI/Hepatic negative GI ROS, Neg liver ROS, GERD  Medicated,  Endo/Other  negative endocrine ROS  Renal/GU negative Renal ROS  negative genitourinary   Musculoskeletal   Abdominal Normal abdominal exam  (+)   Peds negative pediatric ROS (+)  Hematology negative hematology ROS (+)   Anesthesia Other Findings Past Medical History: No date: Arthritis No date: GERD (gastroesophageal reflux disease) No date: Hyperlipidemia No date: Hypertension No date: Lewy body dementia ([place]) No date: Osteoporosis No date: Prostate enlargement  Past Surgical History: 09/10/2018: CHOLECYSTECTOMY; N/A     Comment:  Procedure: LAPAROSCOPIC CHOLECYSTECTOMY;  Surgeon:               Benjamine Sprague, DO;  Location: [facility] ORS;  Service: General;              Laterality: N/A; 10/20/2020: CYSTOSCOPY WITH LITHOLAPAXY; N/A     Comment:  Procedure: CYSTOSCOPY WITH LITHOLAPAXY;  Surgeon:               [name], MD;  Location: [facility] ORS;  Service:               Urology;  Laterality: N/A; 2013: GANGLION CYST EXCISION; Left     Comment:  Left upper arm 2008: HERNIA REPAIR; N/A     Comment:  MIdline, ventral acquired hernia repair 11/06/2018: HOLEP-LASER  ENUCLEATION OF THE PROSTATE WITH  MORCELLATION; N/A     Comment:  Procedure: HOLEP-LASER ENUCLEATION OF THE PROSTATE WITH               MORCELLATION;  Surgeon: [name], MD;  Location:               [facility] ORS;  Service: Urology;  Laterality: N/A; 10/20/2020: HOLEP-LASER ENUCLEATION OF THE PROSTATE WITH  MORCELLATION; N/A     Comment:  Procedure: HOLEP-LASER ENUCLEATION OF THE PROSTATE WITH               MORCELLATION;  Surgeon: [name], MD;  Location:               [facility] ORS;  Service: Urology;  Laterality: N/A;  BMI    Body Mass Index: 25.50 kg/m      Reproductive/Obstetrics negative OB ROS                             Anesthesia Physical Anesthesia Plan  ASA: 4  Anesthesia Plan: General   Post-op Pain Management:    Induction: Intravenous  PONV Risk Score and Plan: Propofol infusion and TIVA  Airway Management Planned: Natural Airway  Additional Equipment:   Intra-op Plan:   Post-operative Plan:   Informed Consent: I have reviewed the patients History and Physical, chart, labs and discussed the procedure  including the risks, benefits and alternatives for the proposed anesthesia with the patient or authorized representative who has indicated his/her understanding and acceptance.     Dental Advisory Given  Plan Discussed with: CRNA and Surgeon  Anesthesia Plan Comments:         Anesthesia Quick Evaluation

## 2021-10-11 NOTE — Telephone Encounter (Signed)
Merleen Nicely advised.   Thanks,   -Mickel Baas

## 2021-10-12 ENCOUNTER — Encounter: Payer: Self-pay | Admitting: Gastroenterology

## 2021-10-15 ENCOUNTER — Other Ambulatory Visit: Payer: Medicare Other | Admitting: Primary Care

## 2021-10-15 ENCOUNTER — Ambulatory Visit: Payer: Self-pay

## 2021-10-15 NOTE — Patient Instructions (Signed)
Visit Information  Thank you for taking time to visit with me today. Please don't hesitate to contact me if I can be of assistance to you.   Following are the goals we discussed today:   Goals Addressed             This Visit's Progress    RNCM: Effective Management of Lewy body dementia       Care Coordination Interventions: Evaluation of current treatment plan related to Lewy Body dementia and patient's adherence to plan as established by provider. The patient has been home x 2 weeks after being discharged from SNF. She states that his memory is getting worse but she states that she has a caregiver there with her dad while she is working. He had COVID when being in the facility and his family has had it also. The patient is stable at home. Has seen neurology and had an EGD where a "medium" hiatal hernia was found.  They say he is high risk for surgery and do not advise surgical intervention at this time.  Provided education to patient re: discussing changes in lewy body and plan of care for advancement of the disease process. The patients daughter has caregiver support in place. Advised the patients daughter to discuss with the pcp at Fridays visit palliative services and the future needs of the patient.  Discussed plans with patient for ongoing care management follow up and provided patient with direct contact information for care management team Advised patient to discuss changes in dementia and recommendations for effective management of Lewy body with provider. Has an appointment on 10-19-2021 with the pcp. The patient has had an EGD since last outreach and is doing well from this. His caregiver has COVID presently and therefore the patients daughters son in law is staying with the patient today.  Review of conditions post SNF discharge. The patient has lost a lot of weight however the patient daughter states he seems to be eating well and also has snacks in between meals. Education and  support.  Discussed self care for the patients daughter. The daughter is working full time and is also caring for the patient after getting off of work.  Screening for signs and symptoms of depression related to chronic disease state  Assessed social determinant of health barriers        RNCM: Fall prevention and safety       Care Coordination Interventions: Provided written and verbal education re: potential causes of falls and Fall prevention strategies Reviewed medications and discussed potential side effects of medications such as dizziness and frequent urination Advised patient of importance of notifying provider of falls Assessed for signs and symptoms of orthostatic hypotension Assessed for falls since last encounter. Per the patients daughter the patient had a new fall since being home but the daughter states, "It was my fault" Denies injuries.  Assessed patients knowledge of fall risk prevention secondary to previously provided education. Safety reviewed with the patients daughter and education and support given Provided patient information for fall alert systems Assessed working status of life alert bracelet and patient adherence Advised patient to discuss falls and safety prevention  with provider Screening for signs and symptoms of depression related to chronic disease state  Assessed social determinant of health barriers Patients handicap place card expires in December of 2023. Instructions provided today for the daughter to go to [facility] website and fill out paperwork and have pcp sign to get new place cards  Our next appointment is by telephone on 12-20-2021 at 0900 am  Please call the care guide team at [phone] if you need to cancel or reschedule your appointment.   If you are experiencing a Mental Health or [place] or need someone to talk to, please call the Suicide and Crisis Lifeline: 988 call the [place] National Suicide Prevention Lifeline:  657-471-4833 or TTY: 640-219-4579 TTY 501-369-5722) to talk to a trained counselor call 1-800-273-TALK (toll free, 24 hour hotline)  Patient verbalizes understanding of instructions and care plan provided today and agrees to view in [place]. Active MyChart status and patient understanding of how to access instructions and care plan via MyChart confirmed with patient.     Telephone follow up appointment with care management team member scheduled for: 12-20-2021 at 0900 am   [place], MSN, [place]  Mobile: [phone]

## 2021-10-15 NOTE — Patient Outreach (Signed)
Care Coordination   Follow Up Visit Note   10/15/2021 Name: Randy [name] MRN: [id number] DOB: 04-Oct-1934  Randy [name] is a 87 y.o. year old male who sees [name], DO for primary care. I spoke with  [name], the patients daughter and DRP,  by phone today.  What matters to the patients health and wellness today?  Preventing falls, and no further decline in the patients health and well being at this time.     Goals Addressed             This Visit's Progress    RNCM: Effective Management of Lewy body dementia       Care Coordination Interventions: Evaluation of current treatment plan related to Lewy Body dementia and patient's adherence to plan as established by provider. The patient has been home x 2 weeks after being discharged from SNF. She states that his memory is getting worse but she states that she has a caregiver there with her dad while she is working. He had COVID when being in the facility and his family has had it also. The patient is stable at home. Has seen neurology and had an EGD where a "medium" hiatal hernia was found.  They say he is high risk for surgery and do not advise surgical intervention at this time.  Provided education to patient re: discussing changes in lewy body and plan of care for advancement of the disease process. The patients daughter has caregiver support in place. Advised the patients daughter to discuss with the pcp at Fridays visit palliative services and the future needs of the patient.  Discussed plans with patient for ongoing care management follow up and provided patient with direct contact information for care management team Advised patient to discuss changes in dementia and recommendations for effective management of Lewy body with provider. Has an appointment on 10-19-2021 with the pcp. The patient has had an EGD since last outreach and is doing well from this. His caregiver has COVID presently and therefore the patients  daughters son in law is staying with the patient today.  Review of conditions post SNF discharge. The patient has lost a lot of weight however the patient daughter states he seems to be eating well and also has snacks in between meals. Education and support.  Discussed self care for the patients daughter. The daughter is working full time and is also caring for the patient after getting off of work.  Screening for signs and symptoms of depression related to chronic disease state  Assessed social determinant of health barriers        RNCM: Fall prevention and safety       Care Coordination Interventions: Provided written and verbal education re: potential causes of falls and Fall prevention strategies Reviewed medications and discussed potential side effects of medications such as dizziness and frequent urination Advised patient of importance of notifying provider of falls Assessed for signs and symptoms of orthostatic hypotension Assessed for falls since last encounter. Per the patients daughter the patient had a new fall since being home but the daughter states, "It was my fault" Denies injuries.  Assessed patients knowledge of fall risk prevention secondary to previously provided education. Safety reviewed with the patients daughter and education and support given Provided patient information for fall alert systems Assessed working status of life alert bracelet and patient adherence Advised patient to discuss falls and safety prevention  with provider Screening for signs and symptoms of depression related  to chronic disease state  Assessed social determinant of health barriers Patients handicap place card expires in December of 2023. Instructions provided today for the daughter to go to [facility] website and fill out paperwork and have pcp sign to get new place cards           SDOH assessments and interventions completed:  No     Care Coordination Interventions Activated:  Yes  Care  Coordination Interventions:  Yes, provided   Follow up plan: Follow up call scheduled for 12-20-2021 at 0900 am    Encounter Outcome:  Pt. Visit Completed    Noreene Larsson RN, MSN, [place]  Mobile: [phone]

## 2021-10-16 ENCOUNTER — Telehealth: Payer: Self-pay

## 2021-10-16 NOTE — Telephone Encounter (Signed)
Okay to proceed with orders  [name], [facility] 10/16/2021, 11:57 AM

## 2021-10-16 NOTE — Telephone Encounter (Signed)
Copied from [place] [id number]. Topic: Quick Communication - Home Health Verbal Orders >> Oct 16, 2021  8:21 AM Marcellus Scott wrote: Caller/Agency: [facility] Number: [phone] Requesting OT/PT/Skilled Nursing/Social Work/Speech Therapy: OT Frequency: 1w6

## 2021-10-17 NOTE — Telephone Encounter (Signed)
Message left on Stephanie's machine

## 2021-10-19 ENCOUNTER — Ambulatory Visit (INDEPENDENT_AMBULATORY_CARE_PROVIDER_SITE_OTHER): Payer: Medicare Other | Admitting: Family Medicine

## 2021-10-19 ENCOUNTER — Encounter: Payer: Self-pay | Admitting: Family Medicine

## 2021-10-19 VITALS — BP 126/66 | HR 78 | Temp 97.3°F | Wt 200.0 lb

## 2021-10-19 DIAGNOSIS — M25562 Pain in left knee: Secondary | ICD-10-CM

## 2021-10-19 DIAGNOSIS — G8929 Other chronic pain: Secondary | ICD-10-CM | POA: Diagnosis not present

## 2021-10-19 DIAGNOSIS — Z23 Encounter for immunization: Secondary | ICD-10-CM | POA: Diagnosis not present

## 2021-10-19 DIAGNOSIS — M25552 Pain in left hip: Secondary | ICD-10-CM | POA: Diagnosis not present

## 2021-10-19 MED ORDER — TRAMADOL HCL 50 MG PO TABS: 50.0000 mg | ORAL_TABLET | Freq: Three times a day (TID) | ORAL | 3 refills | Status: DC | PRN

## 2021-10-19 NOTE — Progress Notes (Unsigned)
Subjective:    Patient ID: Randy [name], male    DOB: April 05, 1934, 87 y.o.   MRN: [id number]  Randy [name] is a 87 y.o. male presenting on 10/19/2021 for No chief complaint on file.   HPI  HOSPITAL FOLLOW-UP VISIT  Hospital/Location: [facility] Date of Admission: 07/28/21 Date of Discharge: 07/31/21 Transitions of care telephone call: Not completed  Reason for Admission: Altered Mental Status / Ambulatory Dysfunction Weakness / [facility] H&P and Discharge Summary have been reviewed - Patient presents today 2+ months after recent hospitalization. Brief summary of recent course, patient had symptoms of abnormal mental status, weakness, recurrent falls, hospitalized,  treated with pain management for his hip and imaging, neurology consult for eval of lewy body dementia, difficulty with PT OT and then SNF recommended, discharged to SNF for 2 months approximately.  Hospitalized 07/28/21 > DC'd to SNF Aug > Sept Now initiated [facility] PT - 2 x a week Weekdays during the day Son in law to help stay and respite care  Needs assistance with ADLs, to get dressed, hygiene, transfers to and from chairs, and to bathroom, meal prep and medication admin.  Knee pain episodic Previously on Tramadol PRN  Admits some pain across chest, worse with laying down, positional only due to muscle straining when easing into bed due to weak muscles Re order Tramadol PM, was out of med  Recently seen Dr Marius Ditch for upper GI EGD, medium hiatal hernia (likely contributing to his symptoms), no specimens collected.  Swallowing when taking larger pills was the problem   I have reviewed the discharge medication list, and have reconciled the current and discharge medications today.   Current Outpatient Medications:    acetaminophen (TYLENOL) 500 MG tablet, Take 1,000 mg by mouth every 6 (six) hours as needed for moderate pain., Disp: , Rfl:    carbidopa-levodopa (SINEMET CR) 50-200 MG tablet, Take 1 tablet by  mouth 4 (four) times daily., Disp: , Rfl:    cholecalciferol (VITAMIN D) 1000 units tablet, Take 1,000 Units by mouth daily., Disp: , Rfl:    Coenzyme Q10 (CO Q 10) 100 MG CAPS, Take 100 mg by mouth daily. , Disp: , Rfl:    diclofenac Sodium (VOLTAREN) 1 % GEL, APPLY 2 G TOPICALLY 4 (FOUR) TIMES DAILY AS NEEDED. TO KNEE ARTHRITIS PAIN, Disp: 100 g, Rfl: 3   enalapril (VASOTEC) 5 MG tablet, Take 5 mg by mouth daily., Disp: , Rfl:    fluticasone (FLONASE) 50 MCG/ACT nasal spray, Place 2 sprays into both nostrils as needed for allergies., Disp: , Rfl:    furosemide (LASIX) 20 MG tablet, Take 1 tablet (20 mg total) by mouth every other day. (Patient taking differently: Take 20 mg by mouth every other day. Patient takes Mondays, Wednesdays, and Fridays.), Disp: 45 tablet, Rfl: 0   melatonin 5 MG TABS, Take 1 tablet (5 mg total) by mouth at bedtime., Disp: , Rfl: 0   Multiple Vitamin (MULTIVITAMIN WITH MINERALS) TABS tablet, Take 1 tablet by mouth daily., Disp: 30 tablet, Rfl: 0   Omega-3 Fatty Acids (FISH OIL) 1200 MG CAPS, Take 1,200 mg by mouth in the morning, at noon, in the evening, and at bedtime., Disp: , Rfl:    omeprazole (PRILOSEC) 20 MG capsule, Take 20 mg by mouth 2 (two) times daily., Disp: , Rfl:    rivastigmine (EXELON) 3 MG capsule, Take 3 mg by mouth 2 (two) times daily., Disp: , Rfl:    sertraline (ZOLOFT)  50 MG tablet, Take 50 mg by mouth daily., Disp: , Rfl:    vitamin B-12 1000 MCG tablet, Take 1 tablet (1,000 mcg total) by mouth daily., Disp: , Rfl:    vitamin C (ASCORBIC ACID) 500 MG tablet, Take 500 mg by mouth daily., Disp: , Rfl:    traMADol (ULTRAM) 50 MG tablet, Take 1 tablet (50 mg total) by mouth every 8 (eight) hours as needed., Disp: 30 tablet, Rfl: 3  ------------------------------------------------------------------------- Social History   Tobacco Use   Smoking status: Former    Packs/day: 1.00    Years: 12.00    Total pack years: 12.00    Types: Cigarettes     Quit date: 1968    Years since quitting: 55.8   Smokeless tobacco: Never  Vaping Use   Vaping Use: Never used  Substance Use Topics   Alcohol use: No   Drug use: No    Review of Systems Per HPI unless specifically indicated above     Objective:    BP 126/66 (BP Location: Right Arm, Patient Position: Sitting, Cuff Size: Normal)   Pulse 78   Temp (!) 97.3 F (36.3 C) (Temporal)   Wt 200 lb (90.7 kg)   SpO2 98%   BMI 27.12 kg/m   Wt Readings from Last 3 Encounters:  10/19/21 200 lb (90.7 kg)  10/11/21 188 lb (85.3 kg)  10/05/21 187 lb (84.8 kg)    Physical Exam Vitals and nursing note reviewed.  Constitutional:      General: He is not in acute distress.    Appearance: He is well-developed. He is obese. He is not diaphoretic.     Comments: Well-appearing, comfortable, cooperative  HENT:     Head: Normocephalic and atraumatic.  Eyes:     General:        Right eye: No discharge.        Left eye: No discharge.     Conjunctiva/sclera: Conjunctivae normal.  Neck:     Thyroid: No thyromegaly.  Cardiovascular:     Rate and Rhythm: Normal rate and regular rhythm.     Pulses: Normal pulses.     Heart sounds: Normal heart sounds. No murmur heard. Pulmonary:     Effort: Pulmonary effort is normal. No respiratory distress.     Breath sounds: Normal breath sounds. No wheezing or rales.  Musculoskeletal:        General: Normal range of motion.     Cervical back: Normal range of motion and neck supple.     Comments: Wheelchair  Lymphadenopathy:     Cervical: No cervical adenopathy.  Skin:    General: Skin is warm and dry.     Findings: No erythema or rash.  Neurological:     Mental Status: He is alert and oriented to person, place, and time. Mental status is at baseline.  Psychiatric:        Behavior: Behavior normal.     Comments: Well groomed, good eye contact, normal speech and thoughts        Results for orders placed or performed during the hospital encounter  of 07/28/21  Urinalysis, Routine w reflex microscopic  Result Value Ref Range   Color, Urine YELLOW (A) YELLOW   APPearance CLEAR (A) CLEAR   Specific Gravity, Urine 1.019 1.005 - 1.030   pH 6.0 5.0 - 8.0   Glucose, UA NEGATIVE NEGATIVE mg/dL   Hgb urine dipstick NEGATIVE NEGATIVE   Bilirubin Urine NEGATIVE NEGATIVE   Ketones, ur 5 (A)  NEGATIVE mg/dL   Protein, ur NEGATIVE NEGATIVE mg/dL   Nitrite NEGATIVE NEGATIVE   Leukocytes,Ua NEGATIVE NEGATIVE  CBC with Differential  Result Value Ref Range   WBC 6.6 4.0 - 10.5 K/uL   RBC 4.01 (L) 4.22 - 5.81 MIL/uL   Hemoglobin 12.2 (L) 13.0 - 17.0 g/dL   HCT 37.8 (L) 39.0 - 52.0 %   MCV 94.3 80.0 - 100.0 fL   MCH 30.4 26.0 - 34.0 pg   MCHC 32.3 30.0 - 36.0 g/dL   RDW 13.3 11.5 - 15.5 %   Platelets 185 150 - 400 K/uL   nRBC 0.0 0.0 - 0.2 %   Neutrophils Relative % 65 %   Neutro Abs 4.3 1.7 - 7.7 K/uL   Lymphocytes Relative 21 %   Lymphs Abs 1.4 0.7 - 4.0 K/uL   Monocytes Relative 11 %   Monocytes Absolute 0.7 0.1 - 1.0 K/uL   Eosinophils Relative 2 %   Eosinophils Absolute 0.1 0.0 - 0.5 K/uL   Basophils Relative 1 %   Basophils Absolute 0.0 0.0 - 0.1 K/uL   Immature Granulocytes 0 %   Abs Immature Granulocytes 0.02 0.00 - 0.07 K/uL  Comprehensive metabolic panel  Result Value Ref Range   Sodium 138 135 - 145 mmol/L   Potassium 3.9 3.5 - 5.1 mmol/L   Chloride 102 98 - 111 mmol/L   CO2 29 22 - 32 mmol/L   Glucose, Bld 98 70 - 99 mg/dL   BUN 28 (H) 8 - 23 mg/dL   Creatinine, Ser 0.72 0.61 - 1.24 mg/dL   Calcium 8.6 (L) 8.9 - 10.3 mg/dL   Total Protein 6.8 6.5 - 8.1 g/dL   Albumin 3.6 3.5 - 5.0 g/dL   AST 24 15 - 41 U/L   ALT 14 0 - 44 U/L   Alkaline Phosphatase 90 38 - 126 U/L   Total Bilirubin 1.3 (H) 0.3 - 1.2 mg/dL   GFR, Estimated >60 >60 mL/min   Anion gap 7 5 - 15  Lipid panel  Result Value Ref Range   Cholesterol 154 0 - 200 mg/dL   Triglycerides 57 <150 mg/dL   HDL 41 >40 mg/dL   Total CHOL/HDL Ratio 3.8 RATIO    VLDL 11 0 - 40 mg/dL   LDL Cholesterol 102 (H) 0 - 99 mg/dL  Hemoglobin A1c  Result Value Ref Range   Hgb A1c MFr Bld 5.3 4.8 - 5.6 %   Mean Plasma Glucose 105.41 mg/dL  CBC  Result Value Ref Range   WBC 6.1 4.0 - 10.5 K/uL   RBC 3.80 (L) 4.22 - 5.81 MIL/uL   Hemoglobin 11.6 (L) 13.0 - 17.0 g/dL   HCT 35.3 (L) 39.0 - 52.0 %   MCV 92.9 80.0 - 100.0 fL   MCH 30.5 26.0 - 34.0 pg   MCHC 32.9 30.0 - 36.0 g/dL   RDW 13.4 11.5 - 15.5 %   Platelets 175 150 - 400 K/uL   nRBC 0.0 0.0 - 0.2 %  Creatinine, serum  Result Value Ref Range   Creatinine, Ser 0.72 0.61 - 1.24 mg/dL   GFR, Estimated >60 >60 mL/min  TSH  Result Value Ref Range   TSH 0.914 0.350 - 4.500 uIU/mL  CBC  Result Value Ref Range   WBC 5.1 4.0 - 10.5 K/uL   RBC 3.77 (L) 4.22 - 5.81 MIL/uL   Hemoglobin 11.7 (L) 13.0 - 17.0 g/dL   HCT 35.2 (L) 39.0 - 52.0 %  MCV 93.4 80.0 - 100.0 fL   MCH 31.0 26.0 - 34.0 pg   MCHC 33.2 30.0 - 36.0 g/dL   RDW 13.2 11.5 - 15.5 %   Platelets 170 150 - 400 K/uL   nRBC 0.0 0.0 - 0.2 %  Comprehensive metabolic panel  Result Value Ref Range   Sodium 141 135 - 145 mmol/L   Potassium 3.4 (L) 3.5 - 5.1 mmol/L   Chloride 106 98 - 111 mmol/L   CO2 27 22 - 32 mmol/L   Glucose, Bld 105 (H) 70 - 99 mg/dL   BUN 24 (H) 8 - 23 mg/dL   Creatinine, Ser 0.75 0.61 - 1.24 mg/dL   Calcium 8.8 (L) 8.9 - 10.3 mg/dL   Total Protein 6.2 (L) 6.5 - 8.1 g/dL   Albumin 3.2 (L) 3.5 - 5.0 g/dL   AST 16 15 - 41 U/L   ALT <5 0 - 44 U/L   Alkaline Phosphatase 81 38 - 126 U/L   Total Bilirubin 1.0 0.3 - 1.2 mg/dL   GFR, Estimated >60 >60 mL/min   Anion gap 8 5 - 15  Vitamin B12  Result Value Ref Range   Vitamin B-12 257 180 - 914 pg/mL  Troponin I (High Sensitivity)  Result Value Ref Range   Troponin I (High Sensitivity) 9 <18 ng/L  Troponin I (High Sensitivity)  Result Value Ref Range   Troponin I (High Sensitivity) 9 <18 ng/L      Assessment & Plan:   Problem List Items Addressed This Visit      Knee pain, chronic - Primary   Relevant Medications   traMADol (ULTRAM) 50 MG tablet   Other Visit Diagnoses     Left hip pain       Relevant Medications   traMADol (ULTRAM) 50 MG tablet   Need for influenza vaccination       Relevant Orders   Flu Vaccine QUAD High Dose(Fluad) (Completed)      HFU Reviewed course in hospital and SNF Flu Shot today   Resume Neurology follow up for Lewy Body Dementia  Likely chest wall pain when laying down is due to Musculoskeletal System either rib still injured or muscle strained etc, with some weaker core strength muscles.  I would recommend the Tramadol in the evening to help alleviate this pain.  It does not sound like other cause of pain at this time.  I would suggest Bone Density  DEXA Scan (Bone mineral density) screening for osteoporosis  Call the [place] below anytime to schedule your own appointment now that order has been placed.  [place] at [facility], [address] [place], [place] [address] Phone: [phone]  Meds ordered this encounter  Medications   traMADol (ULTRAM) 50 MG tablet    Sig: Take 1 tablet (50 mg total) by mouth every 8 (eight) hours as needed.    Dispense:  30 tablet    Refill:  3    Follow up plan: Return in about 2 months (around 12/19/2021) for 2 month follow-up Rib Pain.   [name], [facility] 10/19/2021, 3:50 PM

## 2021-10-19 NOTE — Patient Instructions (Addendum)
Thank you for coming to the office today.  Likely chest wall pain when laying down is due to Musculoskeletal System either rib still injured or muscle strained etc, with some weaker core strength muscles.  I would recommend the Tramadol in the evening to help alleviate this pain.  It does not sound like other cause of pain at this time.  I would suggest Bone Density  DEXA Scan (Bone mineral density) screening for osteoporosis  Call the [place] below anytime to schedule your own appointment now that order has been placed.  [place] at [facility] [address] [place] # [place], [place] [address] Phone: [phone]   Please schedule a Follow-up Appointment to: Return in about 2 months (around 12/19/2021) for 2-3 month follow-up Rib Pain.  If you have any other questions or concerns, please feel free to call the office or send a message through [place]. You may also schedule an earlier appointment if necessary.  Additionally, you may be receiving a survey about your experience at our office within a few days to 1 week by e-mail or mail. We value your feedback.  [name], DO [place]

## 2021-10-22 ENCOUNTER — Telehealth: Payer: Self-pay | Admitting: Family Medicine

## 2021-10-22 DIAGNOSIS — R531 Weakness: Secondary | ICD-10-CM

## 2021-10-22 DIAGNOSIS — F0280 Dementia in other diseases classified elsewhere without behavioral disturbance: Secondary | ICD-10-CM

## 2021-10-22 NOTE — Telephone Encounter (Signed)
Patient's wife called in requesting bed with short rail.

## 2021-10-23 NOTE — Telephone Encounter (Signed)
I will handwrite rx pad order and it can be faxed to [place], [facility] 10/23/2021, 12:15 PM

## 2021-10-23 NOTE — Telephone Encounter (Signed)
The patient's daughter has made additional contact regarding the request for the order   [place]  Customer ID [id number]  One short rail for the side of the bed is needed   Please contact further if needed

## 2021-10-31 ENCOUNTER — Other Ambulatory Visit: Payer: Self-pay | Admitting: Family Medicine

## 2021-10-31 DIAGNOSIS — M159 Polyosteoarthritis, unspecified: Secondary | ICD-10-CM

## 2021-10-31 DIAGNOSIS — I10 Essential (primary) hypertension: Secondary | ICD-10-CM

## 2021-10-31 DIAGNOSIS — F0280 Dementia in other diseases classified elsewhere without behavioral disturbance: Secondary | ICD-10-CM

## 2021-11-05 ENCOUNTER — Encounter: Payer: Self-pay | Admitting: Physician Assistant

## 2021-11-05 ENCOUNTER — Ambulatory Visit: Payer: Self-pay | Admitting: *Deleted

## 2021-11-05 ENCOUNTER — Ambulatory Visit: Payer: Medicare Other | Admitting: Physician Assistant

## 2021-11-05 ENCOUNTER — Ambulatory Visit (INDEPENDENT_AMBULATORY_CARE_PROVIDER_SITE_OTHER): Payer: Medicare Other

## 2021-11-05 ENCOUNTER — Ambulatory Visit
Admission: RE | Admit: 2021-11-05 | Discharge: 2021-11-05 | Disposition: A | Discharge status: Home / Self Care | Payer: Medicare Other | Source: Home / Self Care | Attending: Physician Assistant | Admitting: Physician Assistant

## 2021-11-05 ENCOUNTER — Encounter (INDEPENDENT_AMBULATORY_CARE_PROVIDER_SITE_OTHER): Payer: Self-pay

## 2021-11-05 ENCOUNTER — Ambulatory Visit
Admission: RE | Admit: 2021-11-05 | Discharge: 2021-11-05 | Disposition: A | Discharge status: Home / Self Care | Payer: Medicare Other | Source: Ambulatory Visit | Attending: Physician Assistant | Admitting: Physician Assistant

## 2021-11-05 VITALS — BP 133/76 | HR 78 | Ht 72.0 in | Wt 200.0 lb

## 2021-11-05 DIAGNOSIS — J0100 Acute maxillary sinusitis, unspecified: Secondary | ICD-10-CM

## 2021-11-05 DIAGNOSIS — R0989 Other specified symptoms and signs involving the circulatory and respiratory systems: Secondary | ICD-10-CM

## 2021-11-05 DIAGNOSIS — I10 Essential (primary) hypertension: Secondary | ICD-10-CM

## 2021-11-05 DIAGNOSIS — F0280 Dementia in other diseases classified elsewhere without behavioral disturbance: Secondary | ICD-10-CM

## 2021-11-05 DIAGNOSIS — M159 Polyosteoarthritis, unspecified: Secondary | ICD-10-CM

## 2021-11-05 MED ORDER — AMOXICILLIN 500 MG PO CAPS: 1000.0000 mg | ORAL_CAPSULE | Freq: Three times a day (TID) | ORAL | 0 refills | Status: AC

## 2021-11-05 NOTE — Patient Instructions (Signed)
Please call the care guide team at [phone] if you need to cancel or reschedule your appointment.   If you are experiencing a Mental Health or [place] or need someone to talk to, please call the Suicide and Crisis Lifeline: 988 call the [place] National Suicide Prevention Lifeline: 807-645-0917 or TTY: 559-651-7272 TTY [phone]) to talk to a trained counselor call 1-800-273-TALK (toll free, 24 hour hotline)   Following is a copy of your full provider care plan:   Goals Addressed             This Visit's Progress    CCM Expected Outcome:  Monitor, Self-Manage and Reduce Symptoms of  Lewy Body Dementia       Current Barriers:  Knowledge Deficits related to the progression of Lewy body dementia and impact it has on health and well being and impact on relationships of caregivers that work with the patient Care Coordination needs related to caregiver needs and special needs of the patient in a patient with advanced Lewy Body Dementia Chronic Disease Management support and education needs related to effective management of Lewy body dementia  Cognitive Deficits  Planned Interventions: Evaluation of current treatment plan related to management of Lewy body dementia and patient's adherence to plan as established by provider Advised patient to call the office for changes in mood, anxiety, depression, or decreased memory loss Provided education to patient re: changes that occur over the course of time with Lewy body dementia and to monitor for deficits and decline in the patients ability to perform ADLS Reviewed medications with patient and discussed compliance. Works with pharm D for effective management of medications Collaborated with pcp and office staff regarding the patients new onset of cold sx and sx and the need to be evaluated.  Provided patient with fall and safety information educational materials related to preventing falls and safety precautions in patient with  Lewy body dementia Reviewed scheduled/upcoming provider appointments including 11-05-2021 at 11 am Discussed plans with patient for ongoing care management follow up and provided patient with direct contact information for care management team Advised patient to discuss new onset of sx and sx of cold and changes in the patient mobility and memory with provider Screening for signs and symptoms of depression related to chronic disease state  Assessed social determinant of health barriers  Symptom Management: Take medications as prescribed   Attend all scheduled provider appointments Call provider office for new concerns or questions  call the Suicide and Crisis Lifeline: 988 call the [place] National Suicide Prevention Lifeline: 585-407-9733 or TTY: (580)479-7435 TTY [phone]) to talk to a trained counselor call 1-800-273-TALK (toll free, 24 hour hotline) if experiencing a Mental Health or [place]   Follow Up Plan: Telephone follow up appointment with care management team member scheduled for: 12-20-2021 at 0900 am       CCM Expected Outcome:  Monitor, Self-Manage and Reduce Symptoms of  Osteoarthritis joint pain       Current Barriers:  Knowledge Deficits related to how to effectively manage pain and prevent falls with injuries in patient with OA Care Coordination needs related to evaluation of pain in a patient with OA with advanced Lewy body dementia Chronic Disease Management support and education needs related to effective management of OA with patient with memory deficits  Cognitive Deficits  Planned Interventions: Reviewed provider established plan for pain management; Discussed importance of adherence to all scheduled medical appointments; Counseled on the importance of reporting any/all new or changed  pain symptoms or management strategies to pain management provider; Advised patient to report to care team affect of pain on daily activities; Discussed use of  relaxation techniques and/or diversional activities to assist with pain reduction (distraction, imagery, relaxation, massage, acupressure, TENS, heat, and cold application; Reviewed with patient prescribed pharmacological and nonpharmacological pain relief strategies; Advised patient to discuss unresolved pain, changes in level or intensity of pain, and safety and falls prevention with provider; Review of measures to keep the patient safe and preventing falls. The patient is at high risk for falls due to impaired mobility and history or falls. Has worked with PT in the past.   Symptom Management: Take medications as prescribed   Attend all scheduled provider appointments Call pharmacy for medication refills 3-7 days in advance of running out of medications call the Suicide and Crisis Lifeline: 988 call the [place] National Suicide Prevention Lifeline: 405-795-6027 or TTY: 3618314972 TTY [phone]) to talk to a trained counselor call 1-800-273-TALK (toll free, 24 hour hotline) if experiencing a Mental Health or [place]   Follow Up Plan: Telephone follow up appointment with care management team member scheduled for: 12-20-2021 at 0900 am       CCM Expected Outcome:  Monitor, Self-Manage, and Reduce Symptoms of Hypertension       Current Barriers:  Knowledge Deficits related to importance of normalized blood pressures in the patient with lewy body dementia and HTN Care Coordination needs related to taking blood pressures on a regular basis and calling the office for changes in blood pressures in a patient with HTN and Lewy body dementia Chronic Disease Management support and education needs related to effective management of HTN Cognitive Deficits  BP Readings from Last 3 Encounters:  10/19/21 126/66  10/11/21 (!) 172/91  09/13/21 133/82     Planned Interventions: Evaluation of current treatment plan related to hypertension self management and patient's adherence to  plan as established by provider;   Provided education to patient re: stroke prevention, s/s of heart attack and stroke; Reviewed prescribed diet heart healthy Reviewed medications with patient and discussed importance of compliance;  Discussed plans with patient for ongoing care management follow up and provided patient with direct contact information for care management team; Advised patient, providing education and rationale, to monitor blood pressure daily and record, calling PCP for findings outside established parameters;  Reviewed scheduled/upcoming provider appointments including: 11-05-2021 at 11 am with pcp Advised patient to discuss changes in blood pressures and current sx and sx of a cold with green drainage  with provider; Provided education on prescribed diet heart healthy;  Discussed complications of poorly controlled blood pressure such as heart disease, stroke, circulatory complications, vision complications, kidney impairment, sexual dysfunction;  Screening for signs and symptoms of depression related to chronic disease state;  Assessed social determinant of health barriers;   Symptom Management: Take medications as prescribed   Attend all scheduled provider appointments Call provider office for new concerns or questions  call the Suicide and Crisis Lifeline: 988 call the [place] National Suicide Prevention Lifeline: 269-138-0216 or TTY: 681-600-4327 TTY 650 738 5088) to talk to a trained counselor call 1-800-273-TALK (toll free, 24 hour hotline) if experiencing a Mental Health or [place]  check blood pressure 3 times per week write blood pressure results in a log or diary learn about high blood pressure keep a blood pressure log call doctor for signs and symptoms of high blood pressure develop an action plan for high blood pressure keep all doctor  appointments take medications for blood pressure exactly as prescribed report new symptoms to your  doctor eat more whole grains, fruits and vegetables, lean meats and healthy fats  Follow Up Plan: Telephone follow up appointment with care management team member scheduled for:12-20-2021  at 0900 am       COMPLETED: RNCM: Effective Management of Lewy body dementia       Care Coordination Interventions:See new plan of care in CCM Evaluation of current treatment plan related to Lewy Body dementia and patient's adherence to plan as established by provider. The patient has been home x 2 weeks after being discharged from SNF. She states that his memory is getting worse but she states that she has a caregiver there with her dad while she is working. He had COVID when being in the facility and his family has had it also. The patient is stable at home. Has seen neurology and had an EGD where a "medium" hiatal hernia was found.  They say he is high risk for surgery and do not advise surgical intervention at this time.  Provided education to patient re: discussing changes in lewy body and plan of care for advancement of the disease process. The patients daughter has caregiver support in place. Advised the patients daughter to discuss with the pcp at Fridays visit palliative services and the future needs of the patient.  Discussed plans with patient for ongoing care management follow up and provided patient with direct contact information for care management team Advised patient to discuss changes in dementia and recommendations for effective management of Lewy body with provider. Has an appointment on 10-19-2021 with the pcp. The patient has had an EGD since last outreach and is doing well from this. His caregiver has COVID presently and therefore the patients daughters son in law is staying with the patient today.  Review of conditions post SNF discharge. The patient has lost a lot of weight however the patient daughter states he seems to be eating well and also has snacks in between meals. Education and  support.  Discussed self care for the patients daughter. The daughter is working full time and is also caring for the patient after getting off of work.  Screening for signs and symptoms of depression related to chronic disease state  Assessed social determinant of health barriers        COMPLETED: RNCM: Fall prevention and safety       Care Coordination Interventions: See new plan of care in CCM Provided written and verbal education re: potential causes of falls and Fall prevention strategies Reviewed medications and discussed potential side effects of medications such as dizziness and frequent urination Advised patient of importance of notifying provider of falls Assessed for signs and symptoms of orthostatic hypotension Assessed for falls since last encounter. Per the patients daughter the patient had a new fall since being home but the daughter states, "It was my fault" Denies injuries.  Assessed patients knowledge of fall risk prevention secondary to previously provided education. Safety reviewed with the patients daughter and education and support given Provided patient information for fall alert systems Assessed working status of life alert bracelet and patient adherence Advised patient to discuss falls and safety prevention  with provider Screening for signs and symptoms of depression related to chronic disease state  Assessed social determinant of health barriers Patients handicap place card expires in December of 2023. Instructions provided today for the daughter to go to [facility] website and fill out paperwork and have  pcp sign to get new place cards           Patient verbalizes understanding of instructions and care plan provided today and agrees to view in [place]. Active MyChart status and patient understanding of how to access instructions and care plan via MyChart confirmed with patient.     Telephone follow up appointment with care management team member scheduled for:  12-20-2021 at 0900 am

## 2021-11-05 NOTE — Telephone Encounter (Signed)
  Chief Complaint: cough Symptoms: deep cough, congestion,feverish Frequency: over 2 weeks Pertinent Negatives: Patient denies   Disposition: '[]'$ ED /'[]'$ Urgent Care (no appt availability in office) / '[x]'$ Appointment(In office/virtual)/ '[]'$  [facility]/ '[]'$ Home Care/ '[]'$ Refused Recommended Disposition /'[]'$ [place] [facility]/ '[]'$  Follow-up with PCP Additional Notes: History of COVID recent- appointment made for evaluation

## 2021-11-05 NOTE — Chronic Care Management (AMB) (Signed)
Chronic Care Management Provider Comprehensive Care Plan    11/05/2021 Name: Randy [name] MRN: [id number] DOB: 12/19/1934  Referral to Chronic Care Management (CCM) services was placed by Provider:  Dr. Parks Ranger on Date: 10-31-2021.  Chronic Condition 1: Lewy body dementia Provider Assessment and Plan Memory loss likely from neurodegenerative disorder (? Lewy body dementia) + COVID Rivastigmine: Please take 3 mg two times a day with meals   Expected Outcome/Goals Addressed This Visit (Provider CCM goals/Provider Assessment and plan   CCM Expected Outcome:  Monitor, Self-Manage and Reduce Symptoms of  Lewy Body Dementia  Symptom Management Condition 1: Take all medications as prescribed Attend all scheduled provider appointments Call pharmacy for medication refills 3-7 days in advance of running out of medications Call provider office for new concerns or questions  call the Suicide and Crisis Lifeline: 988 call the [place] National Suicide Prevention Lifeline: 470-625-3006 or TTY: 931-763-2549 TTY (214) 212-9546) to talk to a trained counselor call 1-800-273-TALK (toll free, 24 hour hotline) if experiencing a Mental Health or [place] Crisis   Chronic Condition 2: HTN Provider Assessment and Plan Hypertension, BP controlled.  Continue enalapril   Expected Outcome/Goals Addressed This Visit (Provider CCM goals/Provider Assessment and plan   CCM (HYPERTENSION)  EXPECTED OUTCOME:  MONITOR,SELF- MANAGE AND REDUCE SYMPTOMS OF HYPERTENSION   Symptom Management Condition 2: Take all medications as prescribed Attend all scheduled provider appointments Call provider office for new concerns or questions  call the Suicide and Crisis Lifeline: 988 call the [place] National Suicide Prevention Lifeline: (248) 047-6040 or TTY: 8061759096 TTY 867-771-8642) to talk to a trained counselor call 1-800-273-TALK (toll free, 24 hour hotline) if experiencing a Mental Health or [place]  check blood pressure 3 times per week learn about high blood pressure keep a blood pressure log take blood pressure log to all doctor appointments keep all doctor appointments take medications for blood pressure exactly as prescribed report new symptoms to your doctor   Chronic Condition 3: OA Provider Assessment and Plan  Relevant Medications     traMADol (ULTRAM) 50 MG tablet     Expected Outcome/Goals Addressed This Visit (Provider CCM goals/Provider Assessment and plan   CCM Expected Outcome:  Monitor, Self-Manage and Reduce Symptoms of  Osteoarthritis joint pain  Symptom Management Condition 3: Take all medications as prescribed Attend all scheduled provider appointments Call provider office for new concerns or questions  Call the office for changes in level or intensity of pain    Problem List Patient Active Problem List   Diagnosis Date Noted   Dysphagia    Hiatal hernia without gangrene or obstruction    Ambulatory dysfunction 07/28/2021   Hypertensive urgency 07/28/2021   History of prostate cancer 07/28/2021   Parkinsonism 07/28/2021   Altered mental status 07/28/2021   Swelling of limb 12/26/2020   Bilateral primary osteoarthritis of knee 02/15/2020   Pressure injury of buttock, stage 1    Delirium    COVID-19 virus infection 10/08/2019   Unable to care for self 10/08/2019   Caregiver unable to cope 10/08/2019   Recurrent falls 10/08/2019   Lewy body dementia without behavioral disturbance ([place])    Prostate cancer ([place]) 06/30/2019   BPH with urinary obstruction 11/06/2018   Acute cholecystitis 09/08/2018   Myalgia due to statin 02/24/2018   Tricompartment osteoarthritis of left knee 11/14/2017   Generalized weakness 04/28/2017   Chronic low back pain 03/12/2017   Knee pain, chronic 10/01/2016   Chronic venous insufficiency 08/30/2016  BPH without obstruction/lower urinary tract symptoms 03/06/2016   Osteoarthritis of multiple joints  03/06/2016   Degenerative joint disease (DJD) of lumbar spine 03/06/2016   Essential hypertension 03/05/2016   Hyperlipidemia 03/05/2016   Osteoporosis 03/05/2016   GERD (gastroesophageal reflux disease) 03/05/2016   Bilateral lower extremity edema 03/05/2016   Dupuytren's disease of palm 05/28/2015    Medication Management  Current Outpatient Medications:    acetaminophen (TYLENOL) 500 MG tablet, Take 1,000 mg by mouth every 6 (six) hours as needed for moderate pain., Disp: , Rfl:    amoxicillin (AMOXIL) 500 MG capsule, Take 2 capsules (1,000 mg total) by mouth 3 (three) times daily for 7 days., Disp: 42 capsule, Rfl: 0   carbidopa-levodopa (SINEMET CR) 50-200 MG tablet, Take 1 tablet by mouth 4 (four) times daily., Disp: , Rfl:    cholecalciferol (VITAMIN D) 1000 units tablet, Take 1,000 Units by mouth daily., Disp: , Rfl:    Coenzyme Q10 (CO Q 10) 100 MG CAPS, Take 100 mg by mouth daily. , Disp: , Rfl:    diclofenac Sodium (VOLTAREN) 1 % GEL, APPLY 2 G TOPICALLY 4 (FOUR) TIMES DAILY AS NEEDED. TO KNEE ARTHRITIS PAIN, Disp: 100 g, Rfl: 3   enalapril (VASOTEC) 5 MG tablet, Take 5 mg by mouth daily., Disp: , Rfl:    fluticasone (FLONASE) 50 MCG/ACT nasal spray, Place 2 sprays into both nostrils as needed for allergies., Disp: , Rfl:    furosemide (LASIX) 20 MG tablet, Take 1 tablet (20 mg total) by mouth every other day. (Patient taking differently: Take 20 mg by mouth every other day. Patient takes Mondays, Wednesdays, and Fridays.), Disp: 45 tablet, Rfl: 0   melatonin 5 MG TABS, Take 1 tablet (5 mg total) by mouth at bedtime., Disp: , Rfl: 0   Multiple Vitamin (MULTIVITAMIN WITH MINERALS) TABS tablet, Take 1 tablet by mouth daily., Disp: 30 tablet, Rfl: 0   Omega-3 Fatty Acids (FISH OIL) 1200 MG CAPS, Take 1,200 mg by mouth in the morning, at noon, in the evening, and at bedtime., Disp: , Rfl:    omeprazole (PRILOSEC) 20 MG capsule, Take 20 mg by mouth 2 (two) times daily., Disp: , Rfl:     rivastigmine (EXELON) 3 MG capsule, Take 3 mg by mouth 2 (two) times daily., Disp: , Rfl:    sertraline (ZOLOFT) 50 MG tablet, Take 50 mg by mouth daily., Disp: , Rfl:    traMADol (ULTRAM) 50 MG tablet, Take 1 tablet (50 mg total) by mouth every 8 (eight) hours as needed., Disp: 30 tablet, Rfl: 3   vitamin B-12 1000 MCG tablet, Take 1 tablet (1,000 mcg total) by mouth daily., Disp: , Rfl:    vitamin C (ASCORBIC ACID) 500 MG tablet, Take 500 mg by mouth daily., Disp: , Rfl:   Cognitive Assessment Identity Confirmed: : Name; DOB Cognitive Status: Abnormal (patient with Lewy body dementia- daughter talks with staff) Other:  : HIPPA verified on behalf of the patients daughter [name]- who is DRP   Functional Assessment Hearing Difficulty or Deaf: yes Hearing Management: hard of hearing, has hearing aides Wear Glasses or Blind: yes Vision Management: glasses Concentrating, Remembering or Making Decisions Difficulty (CP): yes Concentration Management: has lewy body dementia, sometimes clearer than other times. Difficulty Communicating: no Difficulty Eating/Swallowing: no Walking or Climbing Stairs Difficulty: yes Walking or Climbing Stairs: ambulation difficulty, requires equipment; ambulation difficulty, assistance 1 person Mobility Management: weakness and history of falls, has worked with PT Dressing/Bathing Difficulty: yes Dressing/Bathing: bathing  difficulty, assistance 1 person Dressing/Bathing Management: needs help with ADLs Doing Errands Independently Difficulty (such as shopping) (CP): yes Errands Management: unable to independently do errands, depends on family Change in Functional Status Since Onset of Current Illness/Injury: no   Caregiver Assessment  Primary Source of Support/Comfort: child(ren); spouse Name of Support/Comfort Primary Source: spouse and daughter Hipolito Bayley in Home: spouse; child(ren), adult Name(s) of People in Home: wife and daughter and family Family  Caregiver if Needed: child(ren), adult; spouse Family Caregiver Names: [name], daughter Primary Roles/Responsibilities: disabled; retired   Planned Interventions  Evaluation of current treatment plan related to management of Lewy body dementia and patient's adherence to plan as established by provider Advised patient to call the office for changes in mood, anxiety, depression, or decreased memory loss Provided education to patient re: changes that occur over the course of time with Lewy body dementia and to monitor for deficits and decline in the patients ability to perform ADLS Reviewed medications with patient and discussed compliance. Works with pharm D for effective management of medications Collaborated with pcp and office staff regarding the patients new onset of cold sx and sx and the need to be evaluated.  Provided patient with fall and safety information educational materials related to preventing falls and safety precautions in patient with Lewy body dementia Reviewed scheduled/upcoming provider appointments including 11-05-2021 at 11 am Discussed plans with patient for ongoing care management follow up and provided patient with direct contact information for care management team Advised patient to discuss new onset of sx and sx of cold and changes in the patient mobility and memory with provider Screening for signs and symptoms of depression related to chronic disease state  Assessed social determinant of health barriers Reviewed provider established plan for pain management; Discussed importance of adherence to all scheduled medical appointments; Counseled on the importance of reporting any/all new or changed pain symptoms or management strategies to pain management provider; Advised patient to report to care team affect of pain on daily activities; Discussed use of relaxation techniques and/or diversional activities to assist with pain reduction (distraction, imagery,  relaxation, massage, acupressure, TENS, heat, and cold application; Reviewed with patient prescribed pharmacological and nonpharmacological pain relief strategies; Advised patient to discuss unresolved pain, changes in level or intensity of pain, and safety and falls prevention with provider; Review of measures to keep the patient safe and preventing falls. The patient is at high risk for falls due to impaired mobility and history or falls. Has worked with PT in the past.   Evaluation of current treatment plan related to hypertension self management and patient's adherence to plan as established by provider;   Provided education to patient re: stroke prevention, s/s of heart attack and stroke; Reviewed prescribed diet heart healthy Reviewed medications with patient and discussed importance of compliance;  Discussed plans with patient for ongoing care management follow up and provided patient with direct contact information for care management team; Advised patient, providing education and rationale, to monitor blood pressure daily and record, calling PCP for findings outside established parameters;  Reviewed scheduled/upcoming provider appointments including: 11-05-2021 at 11 am with pcp Advised patient to discuss changes in blood pressures and current sx and sx of a cold with green drainage  with provider; Provided education on prescribed diet heart healthy;  Discussed complications of poorly controlled blood pressure such as heart disease, stroke, circulatory complications, vision complications, kidney impairment, sexual dysfunction;  Screening for signs and symptoms of depression related to chronic disease  state;  Assessed social determinant of health barriers;   Interaction and coordination with outside resources, practitioners, and providers See CCM Referral  Care Plan: Available in MyChart

## 2021-11-05 NOTE — Progress Notes (Signed)
Acute Office Visit   Patient: Randy [name]   DOB: Feb 19, 1934   87 y.o. Male  MRN: [id number] Visit Date: 11/05/2021  Today's healthcare provider: Dani Gobble Carin Shipp, PA-C  Introduced myself to the patient as a Journalist, newspaper and provided education on APPs in clinical practice.    Chief Complaint  Patient presents with   Cough   Nasal Congestion   Subjective    Cough Associated symptoms include headaches, rhinorrhea and wheezing. Pertinent negatives include no chills, ear pain, fever, myalgias, sore throat or shortness of breath.    He is here with his daughter, Randy [name]  Reports he has been having productive cough which is worse at night and he is having trouble sleeping  Reports nasal congestion  Fever:unsure Daughter states that symptoms seem to be getting worse   Medications: Outpatient Medications Prior to Visit  Medication Sig   acetaminophen (TYLENOL) 500 MG tablet Take 1,000 mg by mouth every 6 (six) hours as needed for moderate pain.   carbidopa-levodopa (SINEMET CR) 50-200 MG tablet Take 1 tablet by mouth 4 (four) times daily.   cholecalciferol (VITAMIN D) 1000 units tablet Take 1,000 Units by mouth daily.   Coenzyme Q10 (CO Q 10) 100 MG CAPS Take 100 mg by mouth daily.    diclofenac Sodium (VOLTAREN) 1 % GEL APPLY 2 G TOPICALLY 4 (FOUR) TIMES DAILY AS NEEDED. TO KNEE ARTHRITIS PAIN   enalapril (VASOTEC) 5 MG tablet Take 5 mg by mouth daily.   fluticasone (FLONASE) 50 MCG/ACT nasal spray Place 2 sprays into both nostrils as needed for allergies.   furosemide (LASIX) 20 MG tablet Take 1 tablet (20 mg total) by mouth every other day. (Patient taking differently: Take 20 mg by mouth every other day. Patient takes Mondays, Wednesdays, and Fridays.)   melatonin 5 MG TABS Take 1 tablet (5 mg total) by mouth at bedtime.   Multiple Vitamin (MULTIVITAMIN WITH MINERALS) TABS tablet Take 1 tablet by mouth daily.   Omega-3 Fatty Acids (FISH OIL) 1200 MG CAPS Take 1,200 mg by mouth in  the morning, at noon, in the evening, and at bedtime.   omeprazole (PRILOSEC) 20 MG capsule Take 20 mg by mouth 2 (two) times daily.   rivastigmine (EXELON) 3 MG capsule Take 3 mg by mouth 2 (two) times daily.   sertraline (ZOLOFT) 50 MG tablet Take 50 mg by mouth daily.   traMADol (ULTRAM) 50 MG tablet Take 1 tablet (50 mg total) by mouth every 8 (eight) hours as needed.   vitamin B-12 1000 MCG tablet Take 1 tablet (1,000 mcg total) by mouth daily.   vitamin C (ASCORBIC ACID) 500 MG tablet Take 500 mg by mouth daily.   No facility-administered medications prior to visit.    Review of Systems  Constitutional:  Negative for chills, diaphoresis and fever.  HENT:  Positive for congestion, rhinorrhea and sinus pressure. Negative for ear pain, sinus pain and sore throat.   Eyes:  Positive for pain.  Respiratory:  Positive for cough and wheezing. Negative for chest tightness and shortness of breath.   Gastrointestinal:  Negative for diarrhea, nausea and vomiting.  Musculoskeletal:  Negative for myalgias.  Neurological:  Positive for dizziness and headaches.       Objective    BP 133/76   Pulse 78   Ht 6' (1.829 m)   Wt 200 lb (90.7 kg)   SpO2 96%   BMI 27.12 kg/m    Physical  Exam Vitals reviewed.  Constitutional:      Appearance: Normal appearance.  HENT:     Head: Normocephalic and atraumatic.     Right Ear: Tympanic membrane, ear canal and external ear normal.     Left Ear: Tympanic membrane, ear canal and external ear normal.     Mouth/Throat:     Pharynx: Oropharynx is clear. Uvula midline. No pharyngeal swelling, oropharyngeal exudate or posterior oropharyngeal erythema.  Cardiovascular:     Rate and Rhythm: Normal rate and regular rhythm.     Pulses: Normal pulses.     Heart sounds: Normal heart sounds. No murmur heard.    No friction rub. No gallop.  Pulmonary:     Effort: Pulmonary effort is normal.     Breath sounds: Decreased air movement present. Examination of  the right-middle field reveals decreased breath sounds and rales. Examination of the right-lower field reveals decreased breath sounds and rales. Decreased breath sounds and rales present. No wheezing or rhonchi.  Musculoskeletal:     Cervical back: Normal range of motion.  Lymphadenopathy:     Head:     Right side of head: No submental, submandibular or preauricular adenopathy.     Left side of head: No submental, submandibular or preauricular adenopathy.     Cervical:     Right cervical: No superficial or posterior cervical adenopathy.    Left cervical: No superficial or posterior cervical adenopathy.     Upper Body:     Right upper body: No supraclavicular adenopathy.     Left upper body: No supraclavicular adenopathy.  Neurological:     Mental Status: He is alert.       No results found for any visits on 11/05/21.  Assessment & Plan      No follow-ups on file.       Problem List Items Addressed This Visit   None Visit Diagnoses     Acute maxillary sinusitis, recurrence not specified    -  Primary Acute, new concern Reports sinus congestion, postnasal drainage, headaches, sinus pain for about 2 weeks that is not improving with home measures Symptoms are consistent with sinusitis so will start Amoxicillin 1000 mg PO TID for this and potential pneumonia as chest ausculation revealed active rales and decreased air movement CXR to confirm - negative for signs of consolidation or pneumonia but potential atelectasis in left lower lobes  May continue home management with Robitussin and Mucinex as desired Follow up as needed for persistent or progressing symptoms    Relevant Medications   amoxicillin (AMOXIL) 500 MG capsule   Chest rales     Acute new concern CXR to rule out pneumonia - negative for signs of pneumonia but potential atelectasis noted in left lower lobes Recommend deep breathing exercises to prevent further progression.  Follow up as needed    Relevant Orders    DG Chest 2 View (Completed)        No follow-ups on file.   I, Natane Heward E Cailen Mihalik, PA-C, have reviewed all documentation for this visit. The documentation on 11/05/21 for the exam, diagnosis, procedures, and orders are all accurate and complete.   Talitha Givens, MHS, PA-C [facility]

## 2021-11-05 NOTE — Chronic Care Management (AMB) (Signed)
Chronic Care Management   CCM RN Visit Note  11/05/2021 Name: Randy [name] MRN: [id number] DOB: [birth year]  Subjective: Randy [name] is a 87 y.o. year old male who is a primary care patient of [name], DO. The patient was referred to the Chronic Care Management team for assistance with care management needs subsequent to provider initiation of CCM services and plan of care.    Today's Visit:   Spoke with the the patients daughter, Randy [name]  for initial visit.        Goals Addressed             This Visit's Progress    CCM Expected Outcome:  Monitor, Self-Manage and Reduce Symptoms of  Lewy Body Dementia       Current Barriers:  Knowledge Deficits related to the progression of Lewy body dementia and impact it has on health and well being and impact on relationships of caregivers that work with the patient Care Coordination needs related to caregiver needs and special needs of the patient in a patient with advanced Lewy Body Dementia Chronic Disease Management support and education needs related to effective management of Lewy body dementia  Cognitive Deficits  Planned Interventions: Evaluation of current treatment plan related to management of Lewy body dementia and patient's adherence to plan as established by provider Advised patient to call the office for changes in mood, anxiety, depression, or decreased memory loss Provided education to patient re: changes that occur over the course of time with Lewy body dementia and to monitor for deficits and decline in the patients ability to perform ADLS Reviewed medications with patient and discussed compliance. Works with pharm D for effective management of medications Collaborated with pcp and office staff regarding the patients new onset of cold sx and sx and the need to be evaluated.  Provided patient with fall and safety information educational materials related to preventing falls and safety precautions in patient  with Lewy body dementia Reviewed scheduled/upcoming provider appointments including 11-05-2021 at 11 am Discussed plans with patient for ongoing care management follow up and provided patient with direct contact information for care management team Advised patient to discuss new onset of sx and sx of cold and changes in the patient mobility and memory with provider Screening for signs and symptoms of depression related to chronic disease state  Assessed social determinant of health barriers  Symptom Management: Take medications as prescribed   Attend all scheduled provider appointments Call provider office for new concerns or questions  call the Suicide and Crisis Lifeline: 988 call the [place] National Suicide Prevention Lifeline: 502 094 6297 or TTY: 802-427-8944 TTY 229-304-8130) to talk to a trained counselor call 1-800-273-TALK (toll free, 24 hour hotline) if experiencing a Mental Health or [place]   Follow Up Plan: Telephone follow up appointment with care management team member scheduled for: 12-20-2021 at 0900 am       CCM Expected Outcome:  Monitor, Self-Manage and Reduce Symptoms of  Osteoarthritis joint pain       Current Barriers:  Knowledge Deficits related to how to effectively manage pain and prevent falls with injuries in patient with OA Care Coordination needs related to evaluation of pain in a patient with OA with advanced Lewy body dementia Chronic Disease Management support and education needs related to effective management of OA with patient with memory deficits  Cognitive Deficits  Planned Interventions: Reviewed provider established plan for pain management; Discussed importance of adherence to all scheduled medical  appointments; Counseled on the importance of reporting any/all new or changed pain symptoms or management strategies to pain management provider; Advised patient to report to care team affect of pain on daily activities; Discussed use  of relaxation techniques and/or diversional activities to assist with pain reduction (distraction, imagery, relaxation, massage, acupressure, TENS, heat, and cold application; Reviewed with patient prescribed pharmacological and nonpharmacological pain relief strategies; Advised patient to discuss unresolved pain, changes in level or intensity of pain, and safety and falls prevention with provider; Review of measures to keep the patient safe and preventing falls. The patient is at high risk for falls due to impaired mobility and history or falls. Has worked with PT in the past.   Symptom Management: Take medications as prescribed   Attend all scheduled provider appointments Call pharmacy for medication refills 3-7 days in advance of running out of medications call the Suicide and Crisis Lifeline: 988 call the [place] National Suicide Prevention Lifeline: 712-536-2116 or TTY: 702-405-8876 TTY (463)462-2024) to talk to a trained counselor call 1-800-273-TALK (toll free, 24 hour hotline) if experiencing a Mental Health or [place]   Follow Up Plan: Telephone follow up appointment with care management team member scheduled for: 12-20-2021 at 0900 am       CCM Expected Outcome:  Monitor, Self-Manage, and Reduce Symptoms of Hypertension       Current Barriers:  Knowledge Deficits related to importance of normalized blood pressures in the patient with lewy body dementia and HTN Care Coordination needs related to taking blood pressures on a regular basis and calling the office for changes in blood pressures in a patient with HTN and Lewy body dementia Chronic Disease Management support and education needs related to effective management of HTN Cognitive Deficits  BP Readings from Last 3 Encounters:  10/19/21 126/66  10/11/21 (!) 172/91  09/13/21 133/82     Planned Interventions: Evaluation of current treatment plan related to hypertension self management and patient's adherence  to plan as established by provider;   Provided education to patient re: stroke prevention, s/s of heart attack and stroke; Reviewed prescribed diet heart healthy Reviewed medications with patient and discussed importance of compliance;  Discussed plans with patient for ongoing care management follow up and provided patient with direct contact information for care management team; Advised patient, providing education and rationale, to monitor blood pressure daily and record, calling PCP for findings outside established parameters;  Reviewed scheduled/upcoming provider appointments including: 11-05-2021 at 11 am with pcp Advised patient to discuss changes in blood pressures and current sx and sx of a cold with green drainage  with provider; Provided education on prescribed diet heart healthy;  Discussed complications of poorly controlled blood pressure such as heart disease, stroke, circulatory complications, vision complications, kidney impairment, sexual dysfunction;  Screening for signs and symptoms of depression related to chronic disease state;  Assessed social determinant of health barriers;   Symptom Management: Take medications as prescribed   Attend all scheduled provider appointments Call provider office for new concerns or questions  call the Suicide and Crisis Lifeline: 988 call the [place] National Suicide Prevention Lifeline: 971-101-3769 or TTY: (559) 416-7332 TTY (530) 186-7937) to talk to a trained counselor call 1-800-273-TALK (toll free, 24 hour hotline) if experiencing a Mental Health or [place]  check blood pressure 3 times per week write blood pressure results in a log or diary learn about high blood pressure keep a blood pressure log call doctor for signs and symptoms of high blood pressure  develop an action plan for high blood pressure keep all doctor appointments take medications for blood pressure exactly as prescribed report new symptoms to your  doctor eat more whole grains, fruits and vegetables, lean meats and healthy fats  Follow Up Plan: Telephone follow up appointment with care management team member scheduled for:12-20-2021  at 0900 am       COMPLETED: RNCM: Effective Management of Lewy body dementia       Care Coordination Interventions:See new plan of care in CCM Evaluation of current treatment plan related to Lewy Body dementia and patient's adherence to plan as established by provider. The patient has been home x 2 weeks after being discharged from SNF. She states that his memory is getting worse but she states that she has a caregiver there with her dad while she is working. He had COVID when being in the facility and his family has had it also. The patient is stable at home. Has seen neurology and had an EGD where a "medium" hiatal hernia was found.  They say he is high risk for surgery and do not advise surgical intervention at this time.  Provided education to patient re: discussing changes in lewy body and plan of care for advancement of the disease process. The patients daughter has caregiver support in place. Advised the patients daughter to discuss with the pcp at Fridays visit palliative services and the future needs of the patient.  Discussed plans with patient for ongoing care management follow up and provided patient with direct contact information for care management team Advised patient to discuss changes in dementia and recommendations for effective management of Lewy body with provider. Has an appointment on 10-19-2021 with the pcp. The patient has had an EGD since last outreach and is doing well from this. His caregiver has COVID presently and therefore the patients daughters son in law is staying with the patient today.  Review of conditions post SNF discharge. The patient has lost a lot of weight however the patient daughter states he seems to be eating well and also has snacks in between meals. Education and  support.  Discussed self care for the patients daughter. The daughter is working full time and is also caring for the patient after getting off of work.  Screening for signs and symptoms of depression related to chronic disease state  Assessed social determinant of health barriers        COMPLETED: RNCM: Fall prevention and safety       Care Coordination Interventions: See new plan of care in CCM Provided written and verbal education re: potential causes of falls and Fall prevention strategies Reviewed medications and discussed potential side effects of medications such as dizziness and frequent urination Advised patient of importance of notifying provider of falls Assessed for signs and symptoms of orthostatic hypotension Assessed for falls since last encounter. Per the patients daughter the patient had a new fall since being home but the daughter states, "It was my fault" Denies injuries.  Assessed patients knowledge of fall risk prevention secondary to previously provided education. Safety reviewed with the patients daughter and education and support given Provided patient information for fall alert systems Assessed working status of life alert bracelet and patient adherence Advised patient to discuss falls and safety prevention  with provider Screening for signs and symptoms of depression related to chronic disease state  Assessed social determinant of health barriers Patients handicap place card expires in December of 2023. Instructions provided today for the daughter  to go to [facility] website and fill out paperwork and have pcp sign to get new place cards           Plan:Telephone follow up appointment with care management team member scheduled for:  12-20-2021 at 0900 am  [place], MSN, CCM RN Care Manager  Chronic Care Management Direct Number: [phone]

## 2021-11-05 NOTE — Patient Instructions (Signed)
Based on your symptoms and duration of illness, I believe you may have a bacterial sinus infection  These typically resolve with antibiotic therapy along with at-home comfort measures  Today I have sent in a prescription for amoxicillin 1000 mg to be taken by mouth three times per day for 7 days  FINISH THE ENTIRE COURSE unless you develop an allergic reaction or are instructed to discontinue.  It can take a few days for the antibiotic to kick in so I recommend symptomatic relief with over the counter medication such as the following: Dayquil/ Nyquil Theraflu Alkaseltzer  Coricidin - if you have high blood pressure even if it is well managed with medications  These medications typically have Tylenol in them already so you can take Ibuprofen as needed for further pain/ discomfort and fever management/ do not need to supplement with more outside of those medications  Stay well hydrated with at least 75 oz of water per day to help with recovery  If you notice any of the following please let us know: increased fever not responding to Tylenol or Ibuprofen, swelling around your nose or eyes, difficulty seeing,

## 2021-11-05 NOTE — Telephone Encounter (Signed)
Summary: Cough & Congestion x 2 weeks   The patients daughter, [name] called in stating the patient has had a persistent cough and congestion for at least 2 weeks now. She says it is a deep cough. He is coughing up mucus. He has been taking mucinex, cough syrup, and cough drops which haven't helped at all. Please assist patient further as the soonest available appt is Friday since he cannot do a mychart video visit which would be Thursday.      Reason for Disposition  [1] Continuous (nonstop) coughing interferes with work or school AND [2] no improvement using cough treatment per Care Advice  Answer Assessment - Initial Assessment Questions 1. ONSET: "When did the cough begin?"      At least 2 weeks 2. SEVERITY: "How bad is the cough today?"      bad- trying to move mucus- deep cough 3. SPUTUM: "Describe the color of your sputum" (none, dry cough; clear, white, yellow, green)     green 4. HEMOPTYSIS: "Are you coughing up any blood?" If so ask: "How much?" (flecks, streaks, tablespoons, etc.)     no 5. DIFFICULTY BREATHING: "Are you having difficulty breathing?" If Yes, ask: "How bad is it?" (e.g., mild, moderate, severe)    - MILD: No SOB at rest, mild SOB with walking, speaks normally in sentences, can lie down, no retractions, pulse < 100.    - MODERATE: SOB at rest, SOB with minimal exertion and prefers to sit, cannot lie down flat, speaks in phrases, mild retractions, audible wheezing, pulse 100-120.    - SEVERE: Very SOB at rest, speaks in single words, struggling to breathe, sitting hunched forward, retractions, pulse > 120      normal 6. FEVER: "Do you have a fever?" If Yes, ask: "What is your temperature, how was it measured, and when did it start?"     no 7. CARDIAC HISTORY: "Do you have any history of heart disease?" (e.g., heart attack, congestive heart failure)      *No Answer* 8. LUNG HISTORY: "Do you have any history of lung disease?"  (e.g., pulmonary embolus, asthma,  emphysema)     *No Answer* 9. PE RISK FACTORS: "Do you have a history of blood clots?" (or: recent major surgery, recent prolonged travel, bedridden)     *No Answer* 10. OTHER SYMPTOMS: "Do you have any other symptoms?" (e.g., runny nose, wheezing, chest pain)       Nasal congestion 11. PREGNANCY: "Is there any chance you are pregnant?" "When was your last menstrual period?"       *No Answer* 12. TRAVEL: "Have you traveled out of the country in the last month?" (e.g., travel history, exposures)       *No Answer*  Protocols used: Cough - Acute Productive-A-AH

## 2021-11-06 DIAGNOSIS — G3183 Dementia with Lewy bodies: Secondary | ICD-10-CM

## 2021-11-06 DIAGNOSIS — I10 Essential (primary) hypertension: Secondary | ICD-10-CM | POA: Diagnosis not present

## 2021-11-06 DIAGNOSIS — M159 Polyosteoarthritis, unspecified: Secondary | ICD-10-CM

## 2021-11-06 DIAGNOSIS — F0280 Dementia in other diseases classified elsewhere without behavioral disturbance: Secondary | ICD-10-CM | POA: Diagnosis not present

## 2021-11-14 ENCOUNTER — Ambulatory Visit (INDEPENDENT_AMBULATORY_CARE_PROVIDER_SITE_OTHER): Payer: Medicare Other | Admitting: Pharmacist

## 2021-11-14 DIAGNOSIS — R413 Other amnesia: Secondary | ICD-10-CM

## 2021-11-14 DIAGNOSIS — I10 Essential (primary) hypertension: Secondary | ICD-10-CM

## 2021-11-14 NOTE — Chronic Care Management (AMB) (Signed)
Chronic Care Management CCM Pharmacy Note  11/14/2021 Name:  Randy [name] MRN:  [id number] DOB:  [birth year]   Subjective: Randy [name] is an 87 y.o. year old male who is a primary patient of Randy Hauser, DO.  The CCM team was consulted for assistance with disease management and care coordination needs.    Engaged with patient's daughter/caregiver by telephone for follow up visit for pharmacy case management and/or care coordination services.   Objective:  Medications Reviewed Today     Reviewed by Randy [name], RPH-CPP (Pharmacist) on 11/14/21 at [phone]  Med List Status: <None>   Medication Order Taking? Sig Documenting Provider Last Dose Status Informant  acetaminophen (TYLENOL) 500 MG tablet [id number] Yes Take 1,000 mg by mouth every 6 (six) hours as needed for moderate pain. [provider] Taking Active Family Member  carbidopa-levodopa (SINEMET CR) 50-200 MG tablet [id number] Yes Take 1 tablet by mouth 4 (four) times daily. [provider] Taking Active Family Member           Med Note Randy [name]   Sat Jul 28, 2021 10:48 PM) Last filled 06/17/21 90 day supply  cholecalciferol (VITAMIN D) 1000 units tablet [id number] Yes Take 1,000 Units by mouth daily. [provider] Taking Active Family Member  Coenzyme Q10 (CO Q 10) 100 MG CAPS [id number] Yes Take 100 mg by mouth daily.  [provider] Taking Active Family Member  diclofenac Sodium (VOLTAREN) 1 % GEL [id number]  APPLY 2 G TOPICALLY 4 (FOUR) TIMES DAILY AS NEEDED. TO KNEE ARTHRITIS PAIN Randy Hauser, DO  Active Family Member  enalapril (VASOTEC) 5 MG tablet [id number] Yes Take 5 mg by mouth daily. [provider] Taking Active   fluticasone (FLONASE) 50 MCG/ACT nasal spray [id number] Yes Place 2 sprays into both nostrils as needed for allergies. [provider] Taking Active Family Member           Med Note [name], [name]   Mon Oct 26, 2018   1:07 PM)    furosemide (LASIX) 20 MG tablet [id number] Yes Take 1 tablet (20 mg total) by mouth every other day.  Patient taking differently: Take 20 mg by mouth every other day. Patient takes Mondays, Wednesdays, and Fridays.   Randy Sable, MD Taking Active Family Member           Med Note Briant [name], [facility]   Sat Jul 28, 2021 10:49 PM) Last filled 07/06/21 90 day supply  melatonin 5 MG TABS [id number]  Take 1 tablet (5 mg total) by mouth at bedtime. Randy Nimrod, MD  Active   Multiple Vitamin (MULTIVITAMIN WITH MINERALS) TABS tablet [id number] Yes Take 1 tablet by mouth daily. [name], MD Taking Active Family Member  Omega-3 Fatty Acids (FISH OIL) 1200 MG CAPS [id number] Yes Take 1,200 mg by mouth in the morning, at noon, in the evening, and at bedtime. [provider] Taking Active Family Member  omeprazole (PRILOSEC) 20 MG capsule [id number] Yes Take 20 mg by mouth 2 (two) times daily. [provider] Taking Active Family Member  rivastigmine (EXELON) 3 MG capsule [id number] Yes Take 3 mg by mouth 2 (two) times daily. [provider] Taking Active Family Member           Med Note Briant [name], [facility]   Sat Jul 28, 2021 10:50 PM) Last filled 06/01/21 90 day supply  sertraline (ZOLOFT) 50 MG tablet [id number] Yes Take 50 mg by mouth daily. [provider] Taking Active Family Member           Med Note Randy [name]   Sat Jul 28, 2021 10:51 PM) Last filled 06/17/21 90 day supply  traMADol (ULTRAM) 50 MG tablet [id number] Yes Take 1 tablet (50 mg total) by mouth every 8 (eight) hours as needed. Randy Hauser, DO Taking Active   vitamin B-12 1000 MCG tablet [id number] Yes Take 1 tablet (1,000 mcg total) by mouth daily. Randy Nimrod, MD Taking Active   vitamin C (ASCORBIC ACID) 500 MG tablet [id number] Yes Take 500 mg by mouth daily. [provider] Taking Active Family Member            Pertinent Labs:   Lab Results   Component Value Date   HGBA1C 5.3 07/28/2021    Lab Results  Component Value Date   CREATININE 0.75 07/30/2021   BUN 24 (H) 07/30/2021   NA 141 07/30/2021   K 3.4 (L) 07/30/2021   CL 106 07/30/2021   CO2 27 07/30/2021    SDOH:  (Social Determinants of Health) assessments and interventions performed:  SDOH Interventions    Flowsheet Row Clinical Support from 10/05/2021 in [facility] Coordination from 08/13/2021 in [place] Management from 10/09/2020 in [place]  SDOH Interventions     Food Insecurity Interventions Intervention Not Indicated -- Intervention Not Indicated  Housing Interventions Intervention Not Indicated -- --  Transportation Interventions Intervention Not Indicated, Patient Resources (Friends/Family) Intervention Not Indicated, Other (Comment)  [will need updated handicap placecard, expires 12-2021] Intervention Not Indicated  Utilities Interventions Intervention Not Indicated -- --  Alcohol Usage Interventions Intervention Not Indicated (Score <7) -- --  Financial Strain Interventions Intervention Not Indicated -- Intervention Not Indicated  Physical Activity Interventions Patient Refused Other (Comments)  [patient currently in rehab facility after a fall] --  Stress Interventions Intervention Not Indicated -- --  Social Connections Interventions Intervention Not Indicated -- Intervention Not Indicated       CCM Care Plan  Review of patient past medical history, allergies, medications, health status, including review of consultants reports, laboratory and other test data, was performed as part of comprehensive evaluation and provision of chronic care management services.   Care Plan : PharmD - Medication Managment  Updates made by Randy [name], RPH-CPP since 11/14/2021 12:00 AM     Problem: Disease Progression      Long-Range Goal: Disease Progression  Prevented or Minimized   Start Date: 01/19/2020  Expected End Date: 04/18/2020  Recent Progress: On track  Priority: High  Note:   Current Barriers:  Chronic Disease Management support, education, and care coordination needs related to hypertension, osteoarthritis, hyperlipidemia, insomnia and memory loss Cognitive Deficits  Pharmacist Clinical Goal(s):  Over the next 90 days, caregiver/patient will adhere to plan to optimize therapeutic regimen for hypertension as evidenced by report of adherence to recommended medication management changes through collaboration with PharmD and provider.   Interventions: 1:1 collaboration with Randy Hauser, DO regarding development and update of comprehensive plan of care as evidenced by provider attestation and co-signature Inter-disciplinary care team collaboration (see longitudinal plan of care) Perform chart review Patient seen for Office Visit with PA Junie Panning Mecum at PCP office on 11/05/2021. Provider advised patient: Start Amoxicillin 1000 mg PO TID for 7 days Note from Office Visit with PCP on 10/13, order placed for DEXA scan Today daughter/caregiver reports patient's symptoms significantly improved with completing course  of amoxicillin, now just lingering cough remaining States son-in-law, Gerald Stabs, now providing care to patient throughout the day, including preparing meals for patient and aiding with medication administration Remind to follow up to schedule DEXA scan Daughter confirms having phone number to schedule Reports patient with complete physical therapy with [facility] next week, but son-in-law planning to continue home exercises from PT with patient Reports continues to use caution with use of tramadol. Has patient sits or lies down after taking  Hypertension Reports patient taking: Enalapril 5 mg daily in the morning Furosemide 20 mg on Mondays, Wednesdays and Fridays Reports denies monitoring home BP  recently Reports swelling in legs improved with use of zippered compression socks now and patient elevating legs some throughout the day Encourage caregiver to keep record when monitors home BP, keep log, bring this record to medical appointments and contact office for readings outside of established parameters or symptoms    Medication Adherence Note patient using weekly pillbox with slots for four times daily dosing Reports son-in-law now also assisting with remembering four times/day dosing Have discussed with caregiver additional strategies for using medication alarm as adherence aid   Patient Goals/Self-Care Activities Over the next 90 days, caregiver/patient will:  - take medications as prescribed - check blood pressure, document, and provide at future appointments  Follow Up Plan: Telephone follow up appointment with care management team member scheduled for: 02/25/2022 at 8:30 am      Wallace Cullens, PharmD, Para [place], [place] [phone]

## 2021-11-14 NOTE — Patient Instructions (Signed)
Visit Information  Thank you for taking time to visit with me today. Please don't hesitate to contact me if I can be of assistance to you before our next scheduled telephone appointment.  Following are the goals we discussed today:   Goals Addressed             This Visit's Progress    Pharmacy Goals       Please continue to monitor home blood pressure and keep log of results and have this record to review during our next appointment.   Feel free to call me with any questions or concerns. I look forward to our next call!  Wallace Cullens, PharmD, [place] [phone]         Our next appointment is by telephone on 02/25/2022 at 8:30 am  Please call the care guide team at [phone] if you need to cancel or reschedule your appointment.    Patient verbalizes understanding of instructions and care plan provided today and agrees to view in [place]. Active MyChart status and patient understanding of how to access instructions and care plan via MyChart confirmed with patient.

## 2021-12-06 DIAGNOSIS — I10 Essential (primary) hypertension: Secondary | ICD-10-CM

## 2021-12-14 ENCOUNTER — Other Ambulatory Visit: Payer: Medicare Other

## 2021-12-20 ENCOUNTER — Telehealth: Payer: Self-pay

## 2021-12-20 ENCOUNTER — Telehealth: Payer: Medicare Other

## 2021-12-20 ENCOUNTER — Ambulatory Visit: Payer: Medicare Other | Admitting: Urology

## 2021-12-20 NOTE — Telephone Encounter (Signed)
   CCM RN Visit Note   12-20-2021 Name: [name] MRN: [id number]      DOB: Mar 11, 1934  Subjective: Randy [name] is a 87 y.o. year old male who is a primary care patient of Dr. Parks Ranger. The patient was referred to the Chronic Care Management team for assistance with care management needs subsequent to provider initiation of CCM services and plan of care.      An unsuccessful telephone outreach was attempted today to contact the patient about Chronic Care Management needs.    Plan:A HIPAA compliant phone message was left for the patient providing contact information and requesting a return call.  Noreene Larsson RN, MSN, CCM RN Care Manager  Chronic Care Management Direct Number: [phone]

## 2022-01-04 ENCOUNTER — Telehealth: Payer: Medicare Other

## 2022-01-04 ENCOUNTER — Ambulatory Visit (INDEPENDENT_AMBULATORY_CARE_PROVIDER_SITE_OTHER): Payer: Medicare Other

## 2022-01-04 ENCOUNTER — Other Ambulatory Visit: Payer: Self-pay | Admitting: Family Medicine

## 2022-01-04 DIAGNOSIS — F0280 Dementia in other diseases classified elsewhere without behavioral disturbance: Secondary | ICD-10-CM

## 2022-01-04 DIAGNOSIS — R112 Nausea with vomiting, unspecified: Secondary | ICD-10-CM

## 2022-01-04 DIAGNOSIS — R413 Other amnesia: Secondary | ICD-10-CM

## 2022-01-04 DIAGNOSIS — I10 Essential (primary) hypertension: Secondary | ICD-10-CM

## 2022-01-04 MED ORDER — ONDANSETRON 4 MG PO TBDP: 4.0000 mg | ORAL_TABLET | Freq: Three times a day (TID) | ORAL | 0 refills | Status: DC | PRN

## 2022-01-04 NOTE — Chronic Care Management (AMB) (Signed)
Chronic Care Management   CCM RN Visit Note  01/04/2022 Name: Randy [name] MRN: [id number] DOB: 07-09-1934  Subjective: Randy [name] is a 87 y.o. year old male who is a primary care patient of [name], DO. The patient was referred to the Chronic Care Management team for assistance with care management needs subsequent to provider initiation of CCM services and plan of care.    Today's Visit:   spoke to the patients daughter, [name]  for follow up visit.        Goals Addressed             This Visit's Progress    CCM Expected Outcome:  Monitor, Self-Manage and Reduce Symptoms of  Lewy Body Dementia       Current Barriers:  Knowledge Deficits related to the progression of Lewy body dementia and impact it has on health and well being and impact on relationships of caregivers that work with the patient Care Coordination needs related to caregiver needs and special needs of the patient in a patient with advanced Lewy Body Dementia Chronic Disease Management support and education needs related to effective management of Lewy body dementia  Cognitive Deficits  Planned Interventions: Evaluation of current treatment plan related to management of Lewy body dementia and patient's adherence to plan as established by provider. Incoming call from the patients daughter today. The patient for about one month has been "throwing Up" almost nightly and this seems to have started after having dental work earlier in the month. The patients daughter feels it may be the tramadol he is taking for his OA pain but is unsure. The RNCM was able to instant message with the provider who gave recommendations. The pcp also sent in a script for Zofran for the daughter to have on hand. She states that the patient is not wanting to eat.  Advised patient to call the office for changes in mood, anxiety, depression, or decreased memory loss Provided education to patient re: changes that occur over the  course of time with Lewy body dementia and to monitor for deficits and decline in the patients ability to perform ADLS Reviewed medications with patient and discussed compliance. Works with [facility] for effective management of medications. Review of medications and recommendations provided on which medications stop for a few days to try and trouble shoot if it is side effect of medication causing the nausea with episodes of throwing up. The patients daughter states that he is not eating a lot at all and she is concerned about the patient. Review of the recommendations and the daughter will try what the pcp suggest. Referred back to the neurology for possible needs if it is determined the Rivastigmine. Education provided that the pcp will order zofran for the patient to have on hand for nausea if needed.  Collaborated with pcp on the patients daughter calling and the sx and sx the patient is currently having. Provided patient with fall and safety information educational materials related to preventing falls and safety precautions in patient with Lewy body dementia Reviewed scheduled/upcoming provider appointments including advised the patients daughter to call the office to have an appointment set up for the patient.  Discussed plans with patient for ongoing care management follow up and provided patient with direct contact information for care management team Advised patient to discuss new onset of sx and sx of cold and changes in the patient mobility and memory with provider Screening for signs and symptoms of  depression related to chronic disease state  Assessed social determinant of health barriers  Symptom Management: Take medications as prescribed   Attend all scheduled provider appointments Call provider office for new concerns or questions  call the Suicide and Crisis Lifeline: 988 call the [place] National Suicide Prevention Lifeline: 604-476-0157 or TTY: 201-045-6901 TTY 980 680 7248) to talk  to a trained counselor call 1-800-273-TALK (toll free, 24 hour hotline) if experiencing a Mental Health or [place]   Follow Up Plan: Telephone follow up appointment with care management team member scheduled for: 01-22-2022 at 0900 am       CCM Expected Outcome:  Monitor, Self-Manage, and Reduce Symptoms of Hypertension       Current Barriers:  Knowledge Deficits related to importance of normalized blood pressures in the patient with lewy body dementia and HTN Care Coordination needs related to taking blood pressures on a regular basis and calling the office for changes in blood pressures in a patient with HTN and Lewy body dementia Chronic Disease Management support and education needs related to effective management of HTN Cognitive Deficits  BP Readings from Last 3 Encounters:  11/05/21 133/76  10/19/21 126/66  10/11/21 (!) 172/91     Planned Interventions: Evaluation of current treatment plan related to hypertension self management and patient's adherence to plan as established by provider. Blood pressures are currently more stable. The patients daughter denies any new issues with blood pressures or heart health;   Provided education to patient re: stroke prevention, s/s of heart attack and stroke; Reviewed prescribed diet heart healthy. The patient has had decreased appetite. The patient has been having some nausea with "throwing up" of phlegm. In basket messaging with the pcp concerning the daughter asking for recommendations.  Reviewed medications with patient and discussed importance of compliance. States compliance with medications;  Discussed plans with patient for ongoing care management follow up and provided patient with direct contact information for care management team; Advised patient, providing education and rationale, to monitor blood pressure daily and record, calling PCP for findings outside established parameters;  Reviewed scheduled/upcoming provider  appointments including: No upcoming appointments with the pcp, education on calling for changes or new needs.  Advised patient to discuss changes in blood pressures and current sx and sx of a cold with green drainage  with provider; Provided education on prescribed diet heart healthy;  Discussed complications of poorly controlled blood pressure such as heart disease, stroke, circulatory complications, vision complications, kidney impairment, sexual dysfunction;  Screening for signs and symptoms of depression related to chronic disease state;  Assessed social determinant of health barriers;   Symptom Management: Take medications as prescribed   Attend all scheduled provider appointments Call provider office for new concerns or questions  call the Suicide and Crisis Lifeline: 988 call the [place] National Suicide Prevention Lifeline: (765) 833-3185 or TTY: 831-794-5600 TTY 508-560-4479) to talk to a trained counselor call 1-800-273-TALK (toll free, 24 hour hotline) if experiencing a Mental Health or [place]  check blood pressure 3 times per week write blood pressure results in a log or diary learn about high blood pressure keep a blood pressure log call doctor for signs and symptoms of high blood pressure develop an action plan for high blood pressure keep all doctor appointments take medications for blood pressure exactly as prescribed report new symptoms to your doctor eat more whole grains, fruits and vegetables, lean meats and healthy fats  Follow Up Plan: Telephone follow up appointment with care management team  member scheduled for: 01-22-2022  at 0900 am          Plan:Telephone follow up appointment with care management team member scheduled for:  01-22-2022 at 9 am  [place], MSN, CCM RN Care Manager  Chronic Care Management Direct Number: [phone]

## 2022-01-04 NOTE — Patient Instructions (Signed)
Please call the care guide team at [phone] if you need to cancel or reschedule your appointment.   If you are experiencing a Mental Health or [place] or need someone to talk to, please call the Suicide and Crisis Lifeline: 988 call the [place] National Suicide Prevention Lifeline: 440-301-8435 or TTY: 438-808-9015 TTY 979-663-4941) to talk to a trained counselor call 1-800-273-TALK (toll free, 24 hour hotline)   Following is a copy of the CCM Program Consent:  CCM service includes personalized support from designated clinical staff supervised by the physician, including individualized plan of care and coordination with other care providers 24/7 contact phone numbers for assistance for urgent and routine care needs. Service will only be billed when office clinical staff spend 20 minutes or more in a month to coordinate care. Only one practitioner may furnish and bill the service in a calendar month. The patient may stop CCM services at amy time (effective at the end of the month) by phone call to the office staff. The patient will be responsible for cost sharing (co-pay) or up to 20% of the service fee (after annual deductible is met)  Following is a copy of your full provider care plan:   Goals Addressed             This Visit's Progress    CCM Expected Outcome:  Monitor, Self-Manage and Reduce Symptoms of  Lewy Body Dementia       Current Barriers:  Knowledge Deficits related to the progression of Lewy body dementia and impact it has on health and well being and impact on relationships of caregivers that work with the patient Care Coordination needs related to caregiver needs and special needs of the patient in a patient with advanced Lewy Body Dementia Chronic Disease Management support and education needs related to effective management of Lewy body dementia  Cognitive Deficits  Planned Interventions: Evaluation of current treatment plan related to management of  Lewy body dementia and patient's adherence to plan as established by provider. Incoming call from the patients daughter today. The patient for about one month has been "throwing Up" almost nightly and this seems to have started after having dental work earlier in the month. The patients daughter feels it may be the tramadol he is taking for his OA pain but is unsure. The RNCM was able to instant message with the provider who gave recommendations. The pcp also sent in a script for Zofran for the daughter to have on hand. She states that the patient is not wanting to eat.  Advised patient to call the office for changes in mood, anxiety, depression, or decreased memory loss Provided education to patient re: changes that occur over the course of time with Lewy body dementia and to monitor for deficits and decline in the patients ability to perform ADLS Reviewed medications with patient and discussed compliance. Works with [place] D for effective management of medications. Review of medications and recommendations provided on which medications stop for a few days to try and trouble shoot if it is side effect of medication causing the nausea with episodes of throwing up. The patients daughter states that he is not eating a lot at all and she is concerned about the patient. Review of the recommendations and the daughter will try what the pcp suggest. Referred back to the neurology for possible needs if it is determined the Rivastigmine. Education provided that the pcp will order zofran for the patient to have on hand for nausea if  needed.  Collaborated with pcp on the patients daughter calling and the sx and sx the patient is currently having. Provided patient with fall and safety information educational materials related to preventing falls and safety precautions in patient with Lewy body dementia Reviewed scheduled/upcoming provider appointments including advised the patients daughter to call the office to have an  appointment set up for the patient.  Discussed plans with patient for ongoing care management follow up and provided patient with direct contact information for care management team Advised patient to discuss new onset of sx and sx of cold and changes in the patient mobility and memory with provider Screening for signs and symptoms of depression related to chronic disease state  Assessed social determinant of health barriers  Symptom Management: Take medications as prescribed   Attend all scheduled provider appointments Call provider office for new concerns or questions  call the Suicide and Crisis Lifeline: 988 call the [place] National Suicide Prevention Lifeline: (734)586-6700 or TTY: 920-065-0918 TTY 416-637-0674) to talk to a trained counselor call 1-800-273-TALK (toll free, 24 hour hotline) if experiencing a Mental Health or [place]   Follow Up Plan: Telephone follow up appointment with care management team member scheduled for: 01-22-2022 at 0900 am       CCM Expected Outcome:  Monitor, Self-Manage, and Reduce Symptoms of Hypertension       Current Barriers:  Knowledge Deficits related to importance of normalized blood pressures in the patient with lewy body dementia and HTN Care Coordination needs related to taking blood pressures on a regular basis and calling the office for changes in blood pressures in a patient with HTN and Lewy body dementia Chronic Disease Management support and education needs related to effective management of HTN Cognitive Deficits  BP Readings from Last 3 Encounters:  11/05/21 133/76  10/19/21 126/66  10/11/21 (!) 172/91     Planned Interventions: Evaluation of current treatment plan related to hypertension self management and patient's adherence to plan as established by provider. Blood pressures are currently more stable. The patients daughter denies any new issues with blood pressures or heart health;   Provided education to patient  re: stroke prevention, s/s of heart attack and stroke; Reviewed prescribed diet heart healthy. The patient has had decreased appetite. The patient has been having some nausea with "throwing up" of phlegm. In basket messaging with the pcp concerning the daughter asking for recommendations.  Reviewed medications with patient and discussed importance of compliance. States compliance with medications;  Discussed plans with patient for ongoing care management follow up and provided patient with direct contact information for care management team; Advised patient, providing education and rationale, to monitor blood pressure daily and record, calling PCP for findings outside established parameters;  Reviewed scheduled/upcoming provider appointments including: No upcoming appointments with the pcp, education on calling for changes or new needs.  Advised patient to discuss changes in blood pressures and current sx and sx of a cold with green drainage  with provider; Provided education on prescribed diet heart healthy;  Discussed complications of poorly controlled blood pressure such as heart disease, stroke, circulatory complications, vision complications, kidney impairment, sexual dysfunction;  Screening for signs and symptoms of depression related to chronic disease state;  Assessed social determinant of health barriers;   Symptom Management: Take medications as prescribed   Attend all scheduled provider appointments Call provider office for new concerns or questions  call the Suicide and Crisis Lifeline: 988 call the [place] National Suicide Prevention Lifeline: (401)060-0566  or TTY: (515)463-3365 TTY (931)533-6499) to talk to a trained counselor call 1-800-273-TALK (toll free, 24 hour hotline) if experiencing a Mental Health or [address]  check blood pressure 3 times per week write blood pressure results in a log or diary learn about high blood pressure keep a blood pressure log call  doctor for signs and symptoms of high blood pressure develop an action plan for high blood pressure keep all doctor appointments take medications for blood pressure exactly as prescribed report new symptoms to your doctor eat more whole grains, fruits and vegetables, lean meats and healthy fats  Follow Up Plan: Telephone follow up appointment with care management team member scheduled for: 01-22-2022  at 0900 am          Patient verbalizes understanding of instructions and care plan provided today and agrees to view in [place]. Active MyChart status and patient understanding of how to access instructions and care plan via MyChart confirmed with patient.     Telephone follow up appointment with care management team member scheduled for: 01-22-2022 at 0900 am

## 2022-01-06 DIAGNOSIS — G3183 Dementia with Lewy bodies: Secondary | ICD-10-CM | POA: Diagnosis not present

## 2022-01-06 DIAGNOSIS — I10 Essential (primary) hypertension: Secondary | ICD-10-CM

## 2022-01-06 DIAGNOSIS — F0280 Dementia in other diseases classified elsewhere without behavioral disturbance: Secondary | ICD-10-CM | POA: Diagnosis not present

## 2022-01-22 ENCOUNTER — Ambulatory Visit (INDEPENDENT_AMBULATORY_CARE_PROVIDER_SITE_OTHER): Payer: Medicare Other

## 2022-01-22 ENCOUNTER — Other Ambulatory Visit: Payer: Self-pay | Admitting: Family Medicine

## 2022-01-22 ENCOUNTER — Telehealth: Payer: Medicare Other

## 2022-01-22 DIAGNOSIS — M159 Polyosteoarthritis, unspecified: Secondary | ICD-10-CM

## 2022-01-22 DIAGNOSIS — I10 Essential (primary) hypertension: Secondary | ICD-10-CM

## 2022-01-22 DIAGNOSIS — G8929 Other chronic pain: Secondary | ICD-10-CM

## 2022-01-22 DIAGNOSIS — F0280 Dementia in other diseases classified elsewhere without behavioral disturbance: Secondary | ICD-10-CM

## 2022-01-22 DIAGNOSIS — Z515 Encounter for palliative care: Secondary | ICD-10-CM

## 2022-01-22 DIAGNOSIS — R413 Other amnesia: Secondary | ICD-10-CM

## 2022-01-22 MED ORDER — HYDROCODONE-ACETAMINOPHEN 5-325 MG PO TABS: 1.0000 | ORAL_TABLET | Freq: Four times a day (QID) | ORAL | 0 refills | Status: DC | PRN

## 2022-01-22 NOTE — Chronic Care Management (AMB) (Signed)
Chronic Care Management   CCM RN Visit Note  01/22/2022 Name: Randy [name] MRN: [id number] DOB: September 26, 1934  Subjective: Randy [name] is a 88 y.o. year old male who is a primary care patient of [name], DO. The patient was referred to the Chronic Care Management team for assistance with care management needs subsequent to provider initiation of CCM services and plan of care.    Today's Visit:   Spoke to the patients daughter and DRP, Randy [name]  for follow up visit.        Goals Addressed             This Visit's Progress    CCM Expected Outcome:  Monitor, Self-Manage and Reduce Symptoms of  Lewy Body Dementia       Current Barriers:  Knowledge Deficits related to the progression of Lewy body dementia and impact it has on health and well being and impact on relationships of caregivers that work with the patient Care Coordination needs related to caregiver needs and special needs of the patient in a patient with advanced Lewy Body Dementia Chronic Disease Management support and education needs related to effective management of Lewy body dementia  Cognitive Deficits  Planned Interventions: Evaluation of current treatment plan related to management of Lewy body dementia and patient's adherence to plan as established by provider. The patient is stable at this time. The daughter states she has not noticed and acute changes in his memory. He is eating and sometimes has a not so good appetite but it varies each day. Sometimes he sleeps well, sometime he does not. She denies any new concerns related to his Lewy body dementia Advised patient to call the office for changes in mood, anxiety, depression, or decreased memory loss Provided education to patient re: changes that occur over the course of time with Lewy body dementia and to monitor for deficits and decline in the patients ability to perform ADLS Reviewed medications with patient and discussed compliance. Works with  [name] for effective management of medications. The nausea has subsided and the patient is not having any new issues with nausea at this time. Education provided to watch for new sx and sx with the addition of new pain medications Provided patient with fall and safety information educational materials related to preventing falls and safety precautions in patient with Lewy body dementia Reviewed scheduled/upcoming provider appointments including advised the patients daughter to call the office to have an appointment set up for the patient.  Discussed plans with patient for ongoing care management follow up and provided patient with direct contact information for care management team Advised patient to discuss new onset of sx and sx of cold and changes in the patient mobility and memory with provider Screening for signs and symptoms of depression related to chronic disease state  Assessed social determinant of health barriers  Symptom Management: Take medications as prescribed   Attend all scheduled provider appointments Call provider office for new concerns or questions  call the Suicide and Crisis Lifeline: 988 call the [place] National Suicide Prevention Lifeline: 586-593-8556 or TTY: 873-479-3192 TTY (714) 026-7784) to talk to a trained counselor call 1-800-273-TALK (toll free, 24 hour hotline) if experiencing a Mental Health or [place]   Follow Up Plan: Telephone follow up appointment with care management team member scheduled for: 03-12-2022 at 0900 am       CCM Expected Outcome:  Monitor, Self-Manage and Reduce Symptoms of  Osteoarthritis joint pain  Current Barriers:  Knowledge Deficits related to how to effectively manage pain and prevent falls with injuries in patient with OA Care Coordination needs related to evaluation of pain in a patient with OA with advanced Lewy body dementia Chronic Disease Management support and education needs related to effective management  of OA with patient with memory deficits  Cognitive Deficits  Planned Interventions: Reviewed provider established plan for pain management. The patients daughter states that the Tramadol is not effective for controlling the patients pain. They have been using Tramadol and alternating with tylenol and this is not helping him. He still continues to complain and is not walking unless he absolutely has to do so. He has had injections in the past but that has been a long time ago. The daughter ask for recommendations and about if injections would help with pain relief.  In basket/secure messaging sent to the pcp for recommendations. The pcp is going to send in a script for some hydrocodone-acetaminophen. Instructions given to the patients daughter. Also discussed how this could cause some increased nausea. The daughter directed to call the [facility] or office for any acute changes once the patient takes the medications. Will continue to monitor for changes.  Discussed importance of adherence to all scheduled medical appointments; Counseled on the importance of reporting any/all new or changed pain symptoms or management strategies to pain management provider. Education and review given; Advised patient to report to care team affect of pain on daily activities; Discussed use of relaxation techniques and/or diversional activities to assist with pain reduction (distraction, imagery, relaxation, massage, acupressure, TENS, heat, and cold application. Did discuss trying heat application to see if this would help the patient. The daughter states they have not tried this.  Reviewed with patient prescribed pharmacological and nonpharmacological pain relief strategies. The patient has paid caregiver during the day and he tries to work with him on exercises that the PT did with the patient when they were working with him. The patient lots of times will not do this due to the pain level and increased pain level; Advised  patient to discuss unresolved pain, changes in level or intensity of pain, and safety and falls prevention with provider; Review of measures to keep the patient safe and preventing falls. The patient is at high risk for falls due to impaired mobility and history or falls. Has worked with PT in the past. The daughter denies any new falls with the patient. He is staying in the wheelchair a lot. Education and support given.  Symptom Management: Take medications as prescribed   Attend all scheduled provider appointments Call pharmacy for medication refills 3-7 days in advance of running out of medications call the Suicide and Crisis Lifeline: 988 call the [place] National Suicide Prevention Lifeline: 810-198-4131 or TTY: 604-171-4821 TTY 337-152-7783) to talk to a trained counselor call 1-800-273-TALK (toll free, 24 hour hotline) if experiencing a Mental Health or [place]   Follow Up Plan: Telephone follow up appointment with care management team member scheduled for: 03-12-2022 at 0900 am       CCM Expected Outcome:  Monitor, Self-Manage, and Reduce Symptoms of Hypertension       Current Barriers:  Knowledge Deficits related to importance of normalized blood pressures in the patient with lewy body dementia and HTN Care Coordination needs related to taking blood pressures on a regular basis and calling the office for changes in blood pressures in a patient with HTN and Lewy body dementia Chronic Disease Management  support and education needs related to effective management of HTN Cognitive Deficits  BP Readings from Last 3 Encounters:  11/05/21 133/76  10/19/21 126/66  10/11/21 (!) 172/91     Planned Interventions: Evaluation of current treatment plan related to hypertension self management and patient's adherence to plan as established by provider. Blood pressures are currently more stable. The patients daughter denies any new issues with blood pressures or heart health;    Provided education to patient re: stroke prevention, s/s of heart attack and stroke; Reviewed prescribed diet heart healthy. The patients nausea has resolved. She tried some different things on her own and then the patient did better and has had not further issues with nausea. He sometimes has a better appetite than other days. The patient has a caregiver during the day and this is helping him have more stable eating patterns.  Reviewed medications with patient and discussed importance of compliance. States compliance with medications;  Discussed plans with patient for ongoing care management follow up and provided patient with direct contact information for care management team; Advised patient, providing education and rationale, to monitor blood pressure daily and record, calling PCP for findings outside established parameters;  Reviewed scheduled/upcoming provider appointments including: No upcoming appointments with the pcp, education on calling for changes or new needs.  Advised patient to discuss changes in blood pressures or heart health with provider; Provided education on prescribed diet heart healthy;  Discussed complications of poorly controlled blood pressure such as heart disease, stroke, circulatory complications, vision complications, kidney impairment, sexual dysfunction;  Screening for signs and symptoms of depression related to chronic disease state;  Assessed social determinant of health barriers;   Symptom Management: Take medications as prescribed   Attend all scheduled provider appointments Call provider office for new concerns or questions  call the Suicide and Crisis Lifeline: 988 call the [place] National Suicide Prevention Lifeline: (928) 062-7572 or TTY: 765-439-1666 TTY 365 796 6396) to talk to a trained counselor call 1-800-273-TALK (toll free, 24 hour hotline) if experiencing a Mental Health or [place]  check blood pressure 3 times per week write  blood pressure results in a log or diary learn about high blood pressure keep a blood pressure log call doctor for signs and symptoms of high blood pressure develop an action plan for high blood pressure keep all doctor appointments take medications for blood pressure exactly as prescribed report new symptoms to your doctor eat more whole grains, fruits and vegetables, lean meats and healthy fats  Follow Up Plan: Telephone follow up appointment with care management team member scheduled for: 03-12-2022  at 0900 am          Plan:Telephone follow up appointment with care management team member scheduled for:  03-12-2022 at 0900 am  Noreene Larsson RN, MSN, CCM RN Care Manager  Chronic Care Management Direct Number: [phone]

## 2022-01-22 NOTE — Patient Instructions (Signed)
Please call the care guide team at [phone] if you need to cancel or reschedule your appointment.   If you are experiencing a Mental Health or [place] or need someone to talk to, please call the Suicide and Crisis Lifeline: 988 call the [place] National Suicide Prevention Lifeline: 530-447-2701 or TTY: 6842314617 TTY 7743587362) to talk to a trained counselor call 1-800-273-TALK (toll free, 24 hour hotline)   Following is a copy of the CCM Program Consent:  CCM service includes personalized support from designated clinical staff supervised by the physician, including individualized plan of care and coordination with other care providers 24/7 contact phone numbers for assistance for urgent and routine care needs. Service will only be billed when office clinical staff spend 20 minutes or more in a month to coordinate care. Only one practitioner may furnish and bill the service in a calendar month. The patient may stop CCM services at amy time (effective at the end of the month) by phone call to the office staff. The patient will be responsible for cost sharing (co-pay) or up to 20% of the service fee (after annual deductible is met)  Following is a copy of your full provider care plan:   Goals Addressed             This Visit's Progress    CCM Expected Outcome:  Monitor, Self-Manage and Reduce Symptoms of  Lewy Body Dementia       Current Barriers:  Knowledge Deficits related to the progression of Lewy body dementia and impact it has on health and well being and impact on relationships of caregivers that work with the patient Care Coordination needs related to caregiver needs and special needs of the patient in a patient with advanced Lewy Body Dementia Chronic Disease Management support and education needs related to effective management of Lewy body dementia  Cognitive Deficits  Planned Interventions: Evaluation of current treatment plan related to management of  Lewy body dementia and patient's adherence to plan as established by provider. The patient is stable at this time. The daughter states she has not noticed and acute changes in his memory. He is eating and sometimes has a not so good appetite but it varies each day. Sometimes he sleeps well, sometime he does not. She denies any new concerns related to his Lewy body dementia Advised patient to call the office for changes in mood, anxiety, depression, or decreased memory loss Provided education to patient re: changes that occur over the course of time with Lewy body dementia and to monitor for deficits and decline in the patients ability to perform ADLS Reviewed medications with patient and discussed compliance. Works with [place] D for effective management of medications. The nausea has subsided and the patient is not having any new issues with nausea at this time. Education provided to watch for new sx and sx with the addition of new pain medications Provided patient with fall and safety information educational materials related to preventing falls and safety precautions in patient with Lewy body dementia Reviewed scheduled/upcoming provider appointments including advised the patients daughter to call the office to have an appointment set up for the patient.  Discussed plans with patient for ongoing care management follow up and provided patient with direct contact information for care management team Advised patient to discuss new onset of sx and sx of cold and changes in the patient mobility and memory with provider Screening for signs and symptoms of depression related to chronic disease state  Assessed  social determinant of health barriers  Symptom Management: Take medications as prescribed   Attend all scheduled provider appointments Call provider office for new concerns or questions  call the Suicide and Crisis Lifeline: 988 call the [place] National Suicide Prevention Lifeline: (707)728-3215 or TTY:  819-466-2606 TTY 5063537527) to talk to a trained counselor call 1-800-273-TALK (toll free, 24 hour hotline) if experiencing a Mental Health or [place]   Follow Up Plan: Telephone follow up appointment with care management team member scheduled for: 03-12-2022 at 0900 am       CCM Expected Outcome:  Monitor, Self-Manage and Reduce Symptoms of  Osteoarthritis joint pain       Current Barriers:  Knowledge Deficits related to how to effectively manage pain and prevent falls with injuries in patient with OA Care Coordination needs related to evaluation of pain in a patient with OA with advanced Lewy body dementia Chronic Disease Management support and education needs related to effective management of OA with patient with memory deficits  Cognitive Deficits  Planned Interventions: Reviewed provider established plan for pain management. The patients daughter states that the Tramadol is not effective for controlling the patients pain. They have been using Tramadol and alternating with tylenol and this is not helping him. He still continues to complain and is not walking unless he absolutely has to do so. He has had injections in the past but that has been a long time ago. The daughter ask for recommendations and about if injections would help with pain relief.  In basket/secure messaging sent to the pcp for recommendations. The pcp is going to send in a script for some hydrocodone-acetaminophen. Instructions given to the patients daughter. Also discussed how this could cause some increased nausea. The daughter directed to call the [facility] or office for any acute changes once the patient takes the medications. Will continue to monitor for changes.  Discussed importance of adherence to all scheduled medical appointments; Counseled on the importance of reporting any/all new or changed pain symptoms or management strategies to pain management provider. Education and review given; Advised  patient to report to care team affect of pain on daily activities; Discussed use of relaxation techniques and/or diversional activities to assist with pain reduction (distraction, imagery, relaxation, massage, acupressure, TENS, heat, and cold application. Did discuss trying heat application to see if this would help the patient. The daughter states they have not tried this.  Reviewed with patient prescribed pharmacological and nonpharmacological pain relief strategies. The patient has paid caregiver during the day and he tries to work with him on exercises that the PT did with the patient when they were working with him. The patient lots of times will not do this due to the pain level and increased pain level; Advised patient to discuss unresolved pain, changes in level or intensity of pain, and safety and falls prevention with provider; Review of measures to keep the patient safe and preventing falls. The patient is at high risk for falls due to impaired mobility and history or falls. Has worked with PT in the past. The daughter denies any new falls with the patient. He is staying in the wheelchair a lot. Education and support given.  Symptom Management: Take medications as prescribed   Attend all scheduled provider appointments Call pharmacy for medication refills 3-7 days in advance of running out of medications call the Suicide and Crisis Lifeline: 988 call the [place] National Suicide Prevention Lifeline: (760)661-3013 or TTY: 579-822-8495 TTY 3175070448) to talk to  a trained counselor call 1-800-273-TALK (toll free, 24 hour hotline) if experiencing a Mental Health or [place]   Follow Up Plan: Telephone follow up appointment with care management team member scheduled for: 03-12-2022 at 0900 am       CCM Expected Outcome:  Monitor, Self-Manage, and Reduce Symptoms of Hypertension       Current Barriers:  Knowledge Deficits related to importance of normalized blood pressures in  the patient with lewy body dementia and HTN Care Coordination needs related to taking blood pressures on a regular basis and calling the office for changes in blood pressures in a patient with HTN and Lewy body dementia Chronic Disease Management support and education needs related to effective management of HTN Cognitive Deficits  BP Readings from Last 3 Encounters:  11/05/21 133/76  10/19/21 126/66  10/11/21 (!) 172/91     Planned Interventions: Evaluation of current treatment plan related to hypertension self management and patient's adherence to plan as established by provider. Blood pressures are currently more stable. The patients daughter denies any new issues with blood pressures or heart health;   Provided education to patient re: stroke prevention, s/s of heart attack and stroke; Reviewed prescribed diet heart healthy. The patients nausea has resolved. She tried some different things on her own and then the patient did better and has had not further issues with nausea. He sometimes has a better appetite than other days. The patient has a caregiver during the day and this is helping him have more stable eating patterns.  Reviewed medications with patient and discussed importance of compliance. States compliance with medications;  Discussed plans with patient for ongoing care management follow up and provided patient with direct contact information for care management team; Advised patient, providing education and rationale, to monitor blood pressure daily and record, calling PCP for findings outside established parameters;  Reviewed scheduled/upcoming provider appointments including: No upcoming appointments with the pcp, education on calling for changes or new needs.  Advised patient to discuss changes in blood pressures or heart health with provider; Provided education on prescribed diet heart healthy;  Discussed complications of poorly controlled blood pressure such as heart disease,  stroke, circulatory complications, vision complications, kidney impairment, sexual dysfunction;  Screening for signs and symptoms of depression related to chronic disease state;  Assessed social determinant of health barriers;   Symptom Management: Take medications as prescribed   Attend all scheduled provider appointments Call provider office for new concerns or questions  call the Suicide and Crisis Lifeline: 988 call the [place] National Suicide Prevention Lifeline: 503-504-5041 or TTY: 4504762518 TTY 267-625-3542) to talk to a trained counselor call 1-800-273-TALK (toll free, 24 hour hotline) if experiencing a Mental Health or [place]  check blood pressure 3 times per week write blood pressure results in a log or diary learn about high blood pressure keep a blood pressure log call doctor for signs and symptoms of high blood pressure develop an action plan for high blood pressure keep all doctor appointments take medications for blood pressure exactly as prescribed report new symptoms to your doctor eat more whole grains, fruits and vegetables, lean meats and healthy fats  Follow Up Plan: Telephone follow up appointment with care management team member scheduled for: 03-12-2022  at 0900 am          Patient verbalizes understanding of instructions and care plan provided today and agrees to view in [place]. Active MyChart status and patient understanding of how to access instructions  and care plan via MyChart confirmed with patient.     Telephone follow up appointment with care management team member scheduled for: 03-12-2022 at 0900 am

## 2022-01-23 ENCOUNTER — Ambulatory Visit (INDEPENDENT_AMBULATORY_CARE_PROVIDER_SITE_OTHER): Payer: Medicare Other | Admitting: Nurse Practitioner

## 2022-01-23 ENCOUNTER — Encounter (INDEPENDENT_AMBULATORY_CARE_PROVIDER_SITE_OTHER): Payer: Self-pay | Admitting: Nurse Practitioner

## 2022-01-23 VITALS — BP 151/91 | HR 78

## 2022-01-23 DIAGNOSIS — E782 Mixed hyperlipidemia: Secondary | ICD-10-CM | POA: Diagnosis not present

## 2022-01-23 DIAGNOSIS — I890 Lymphedema, not elsewhere classified: Secondary | ICD-10-CM | POA: Diagnosis not present

## 2022-01-23 DIAGNOSIS — I10 Essential (primary) hypertension: Secondary | ICD-10-CM | POA: Diagnosis not present

## 2022-01-26 ENCOUNTER — Other Ambulatory Visit: Payer: Self-pay | Admitting: Family Medicine

## 2022-01-26 DIAGNOSIS — I10 Essential (primary) hypertension: Secondary | ICD-10-CM

## 2022-01-28 ENCOUNTER — Ambulatory Visit: Payer: Self-pay

## 2022-01-28 DIAGNOSIS — M159 Polyosteoarthritis, unspecified: Secondary | ICD-10-CM

## 2022-01-28 DIAGNOSIS — F0280 Dementia in other diseases classified elsewhere without behavioral disturbance: Secondary | ICD-10-CM

## 2022-01-28 NOTE — Telephone Encounter (Signed)
Requested medications are due for refill today.  UNsure  Requested medications are on the active medications list.  yes  Last refill. 08/30/2021  Future visit scheduled.   no  Notes to clinic.  Medication listed as historical    Requested Prescriptions  Pending Prescriptions Disp Refills   enalapril (VASOTEC) 5 MG tablet [Pharmacy Med Name: ENALAPRIL MALEATE 5 MG TABLET] 90 tablet 3    Sig: TAKE 1 TABLET (5 MG TOTAL) BY MOUTH DAILY.     Cardiovascular:  ACE Inhibitors Failed - 01/26/2022  8:19 AM      Failed - K in normal range and within 180 days    Potassium  Date Value Ref Range Status  07/30/2021 3.4 (L) 3.5 - 5.1 mmol/L Final  04/17/2015 4.0 mmol/L Final         Failed - Last BP in normal range    BP Readings from Last 1 Encounters:  01/23/22 (!) 151/91         Passed - Cr in normal range and within 180 days    Creat  Date Value Ref Range Status  04/02/2019 1.10 0.70 - 1.11 mg/dL Final    Comment:    For patients >86 years of age, the reference limit for Creatinine is approximately 13% higher for people identified as African-American. .    Creatinine, Ser  Date Value Ref Range Status  07/30/2021 0.75 0.61 - 1.24 mg/dL Final         Passed - Patient is not pregnant      Passed - Valid encounter within last 6 months    Recent Outpatient Visits           2 months ago Acute maxillary sinusitis, recurrence not specified   [facility] [place], [place], [place]   3 months ago Chronic pain of left knee   Phil Campbell, DO   1 year ago Nausea and vomiting, unspecified vomiting type   [facility] [place], Coralie Keens, NP   1 year ago Acute non-recurrent frontal sinusitis   [facility] [name], DO   1 year ago Acute cystitis with hematuria   [facility] [place], Devonne Doughty, [place]

## 2022-01-28 NOTE — Patient Instructions (Signed)
Please call the care guide team at [phone] if you need to cancel or reschedule your appointment.   If you are experiencing a Mental Health or [place] or need someone to talk to, please call the Suicide and Crisis Lifeline: 988 call the [place] National Suicide Prevention Lifeline: 331-198-0836 or TTY: (618) 730-8434 TTY 939 820 2823) to talk to a trained counselor call 1-800-273-TALK (toll free, 24 hour hotline)   Following is a copy of the CCM Program Consent:  CCM service includes personalized support from designated clinical staff supervised by the physician, including individualized plan of care and coordination with other care providers 24/7 contact phone numbers for assistance for urgent and routine care needs. Service will only be billed when office clinical staff spend 20 minutes or more in a month to coordinate care. Only one practitioner may furnish and bill the service in a calendar month. The patient may stop CCM services at amy time (effective at the end of the month) by phone call to the office staff. The patient will be responsible for cost sharing (co-pay) or up to 20% of the service fee (after annual deductible is met)  Following is a copy of your full provider care plan:   Goals Addressed             This Visit's Progress    CCM Expected Outcome:  Monitor, Self-Manage and Reduce Symptoms of  Lewy Body Dementia       Current Barriers:  Knowledge Deficits related to the progression of Lewy body dementia and impact it has on health and well being and impact on relationships of caregivers that work with the patient Care Coordination needs related to caregiver needs and special needs of the patient in a patient with advanced Lewy Body Dementia Chronic Disease Management support and education needs related to effective management of Lewy body dementia  Cognitive Deficits  Planned Interventions: Evaluation of current treatment plan related to management of  Lewy body dementia and patient's adherence to plan as established by provider. The patient is stable at this time. The daughter states she has not noticed and acute changes in his memory. He is eating and sometimes has a not so good appetite but it varies each day. Sometimes he sleeps well, sometime he does not. She denies any new concerns related to his Lewy body dementia Advised patient to call the office for changes in mood, anxiety, depression, or decreased memory loss Provided education to patient re: changes that occur over the course of time with Lewy body dementia and to monitor for deficits and decline in the patients ability to perform ADLS Reviewed medications with patient and discussed compliance. Works with [place] D for effective management of medications. The nausea has subsided and the patient is not having any new issues with nausea at this time. Education provided to watch for new sx and sx with the addition of new pain medications. The patients has not started taking the new pain medications as of yet. The daughter states she is going to pick it up today. Instructed the daughter to discontinue to the Tramadol and only take the Norco/Vicodin as directed and to call the office for changes. Shelia verbalized understanding of instructions.  Provided patient with fall and safety information educational materials related to preventing falls and safety precautions in patient with Lewy body dementia Reviewed scheduled/upcoming provider appointments including advised the patients daughter to call the office to have an appointment set up for the patient.  Discussed plans with patient for  ongoing care management follow up and provided patient with direct contact information for care management team Advised patient to discuss new onset of sx and sx of cold and changes in the patient mobility and memory with provider Screening for signs and symptoms of depression related to chronic disease state   Assessed social determinant of health barriers  Symptom Management: Take medications as prescribed   Attend all scheduled provider appointments Call provider office for new concerns or questions  call the Suicide and Crisis Lifeline: 988 call the [place] National Suicide Prevention Lifeline: (224)800-4134 or TTY: (212)297-2284 TTY 410-225-6163) to talk to a trained counselor call 1-800-273-TALK (toll free, 24 hour hotline) if experiencing a Mental Health or [place]   Follow Up Plan: Telephone follow up appointment with care management team member scheduled for: 03-12-2022 at 0900 am       CCM Expected Outcome:  Monitor, Self-Manage and Reduce Symptoms of  Osteoarthritis joint pain       Current Barriers:  Knowledge Deficits related to how to effectively manage pain and prevent falls with injuries in patient with OA Care Coordination needs related to evaluation of pain in a patient with OA with advanced Lewy body dementia Chronic Disease Management support and education needs related to effective management of OA with patient with memory deficits  Cognitive Deficits  Planned Interventions: Reviewed provider established plan for pain management. The patients daughter states that the Tramadol is not effective for controlling the patients pain. They have been using Tramadol and alternating with tylenol and this is not helping him. He still continues to complain and is not walking unless he absolutely has to do so. He has had injections in the past but that has been a long time ago. The daughter ask for recommendations and about if injections would help with pain relief.  In basket/secure messaging sent to the pcp for recommendations. The pcp is going to send in a script for some hydrocodone-acetaminophen. Instructions given to the patients daughter. Also discussed how this could cause some increased nausea. The daughter directed to call the [facility] or office for any acute changes once  the patient takes the medications. Will continue to monitor for changes. Incoming call from the patients daughter today. Call made back to the daughter. She was concerned that the caregiver had given the patient Tramadol and then a couple hours later gave him Tramadol again. She wanted to know if this was okay.  Review if the patients daughter had picked up the Norco/Vicodin 5-'325mg'$  that was ordered last week and the daughter states that she has not had time to get it yet but is planning on picking it up today. Instructed the daughter that this medications should be more effective in helping the patients pain and to discontinue use of the Tramadol and only take the Norco/Vicodin 5-'325mg'$  1 tablet every 6 hours as needed. The patients daughter verbalized understanding. Ask the daughter to repeat instructions. Education provided.  Discussed importance of adherence to all scheduled medical appointments; Counseled on the importance of reporting any/all new or changed pain symptoms or management strategies to pain management provider. Education and review given; Advised patient to report to care team affect of pain on daily activities; Discussed use of relaxation techniques and/or diversional activities to assist with pain reduction (distraction, imagery, relaxation, massage, acupressure, TENS, heat, and cold application. Did discuss trying heat application to see if this would help the patient. The daughter states they have not tried this.  Reviewed with patient  prescribed pharmacological and nonpharmacological pain relief strategies. The patient has paid caregiver during the day and he tries to work with him on exercises that the PT did with the patient when they were working with him. The patient lots of times will not do this due to the pain level and increased pain level; Advised patient to discuss unresolved pain, changes in level or intensity of pain, and safety and falls prevention with provider; Review of  measures to keep the patient safe and preventing falls. The patient is at high risk for falls due to impaired mobility and history or falls. Has worked with PT in the past. The daughter denies any new falls with the patient. He is staying in the wheelchair a lot. Education and support given.  Symptom Management: Take medications as prescribed   Attend all scheduled provider appointments Call pharmacy for medication refills 3-7 days in advance of running out of medications call the Suicide and Crisis Lifeline: 988 call the [place] Suicide Prevention Lifeline: 212-108-2212 or TTY: 470 662 6423 TTY (747)871-8739) to talk to a trained counselor call 1-800-273-TALK (toll free, 24 hour hotline) if experiencing a Mental Health or [place]   Follow Up Plan: Telephone follow up appointment with care management team member scheduled for: 03-12-2022 at 0900 am          Patient verbalizes understanding of instructions and care plan provided today and agrees to view in [place]. Active MyChart status and patient understanding of how to access instructions and care plan via MyChart confirmed with patient.     Telephone follow up appointment with care management team member scheduled for: 03-12-2022 at 9 am

## 2022-01-28 NOTE — Chronic Care Management (AMB) (Signed)
Chronic Care Management   CCM RN Visit Note  01/28/2022 Name: Randy [name] MRN: [id number] DOB: 1934/04/11  Subjective: Randy [name] is a 88 y.o. year old male who is a primary care patient of [name], DO. The patient was referred to the Chronic Care Management team for assistance with care management needs subsequent to provider initiation of CCM services and plan of care.    Today's Visit:   Spoke to the patients daughter, Randy [name]  for  message the patients daughter left on Voicemail asking for a call back .        Goals Addressed             This Visit's Progress    CCM Expected Outcome:  Monitor, Self-Manage and Reduce Symptoms of  Lewy Body Dementia       Current Barriers:  Knowledge Deficits related to the progression of Lewy body dementia and impact it has on health and well being and impact on relationships of caregivers that work with the patient Care Coordination needs related to caregiver needs and special needs of the patient in a patient with advanced Lewy Body Dementia Chronic Disease Management support and education needs related to effective management of Lewy body dementia  Cognitive Deficits  Planned Interventions: Evaluation of current treatment plan related to management of Lewy body dementia and patient's adherence to plan as established by provider. The patient is stable at this time. The daughter states she has not noticed and acute changes in his memory. He is eating and sometimes has a not so good appetite but it varies each day. Sometimes he sleeps well, sometime he does not. She denies any new concerns related to his Lewy body dementia Advised patient to call the office for changes in mood, anxiety, depression, or decreased memory loss Provided education to patient re: changes that occur over the course of time with Lewy body dementia and to monitor for deficits and decline in the patients ability to perform ADLS Reviewed medications with  patient and discussed compliance. Works with [place] [facility] for effective management of medications. The nausea has subsided and the patient is not having any new issues with nausea at this time. Education provided to watch for new sx and sx with the addition of new pain medications. The patients has not started taking the new pain medications as of yet. The daughter states she is going to pick it up today. Instructed the daughter to discontinue to the Tramadol and only take the Norco/Vicodin as directed and to call the office for changes. Shelia verbalized understanding of instructions.  Provided patient with fall and safety information educational materials related to preventing falls and safety precautions in patient with Lewy body dementia Reviewed scheduled/upcoming provider appointments including advised the patients daughter to call the office to have an appointment set up for the patient.  Discussed plans with patient for ongoing care management follow up and provided patient with direct contact information for care management team Advised patient to discuss new onset of sx and sx of cold and changes in the patient mobility and memory with provider Screening for signs and symptoms of depression related to chronic disease state  Assessed social determinant of health barriers  Symptom Management: Take medications as prescribed   Attend all scheduled provider appointments Call provider office for new concerns or questions  call the Suicide and Crisis Lifeline: 988 call the [place] National Suicide Prevention Lifeline: 418-458-1033 or TTY: (910)061-3166 TTY 772-391-1035) to talk to  a trained counselor call 1-800-273-TALK (toll free, 24 hour hotline) if experiencing a Mental Health or [place]   Follow Up Plan: Telephone follow up appointment with care management team member scheduled for: 03-12-2022 at 0900 am       CCM Expected Outcome:  Monitor, Self-Manage and Reduce Symptoms of   Osteoarthritis joint pain       Current Barriers:  Knowledge Deficits related to how to effectively manage pain and prevent falls with injuries in patient with OA Care Coordination needs related to evaluation of pain in a patient with OA with advanced Lewy body dementia Chronic Disease Management support and education needs related to effective management of OA with patient with memory deficits  Cognitive Deficits  Planned Interventions: Reviewed provider established plan for pain management. The patients daughter states that the Tramadol is not effective for controlling the patients pain. They have been using Tramadol and alternating with tylenol and this is not helping him. He still continues to complain and is not walking unless he absolutely has to do so. He has had injections in the past but that has been a long time ago. The daughter ask for recommendations and about if injections would help with pain relief.  In basket/secure messaging sent to the pcp for recommendations. The pcp is going to send in a script for some hydrocodone-acetaminophen. Instructions given to the patients daughter. Also discussed how this could cause some increased nausea. The daughter directed to call the [facility] or office for any acute changes once the patient takes the medications. Will continue to monitor for changes. Incoming call from the patients daughter today. Call made back to the daughter. She was concerned that the caregiver had given the patient Tramadol and then a couple hours later gave him Tramadol again. She wanted to know if this was okay.  Review if the patients daughter had picked up the Norco/Vicodin 5-'325mg'$  that was ordered last week and the daughter states that she has not had time to get it yet but is planning on picking it up today. Instructed the daughter that this medications should be more effective in helping the patients pain and to discontinue use of the Tramadol and only take the Norco/Vicodin  5-'325mg'$  1 tablet every 6 hours as needed. The patients daughter verbalized understanding. Ask the daughter to repeat instructions. Education provided.  Discussed importance of adherence to all scheduled medical appointments; Counseled on the importance of reporting any/all new or changed pain symptoms or management strategies to pain management provider. Education and review given; Advised patient to report to care team affect of pain on daily activities; Discussed use of relaxation techniques and/or diversional activities to assist with pain reduction (distraction, imagery, relaxation, massage, acupressure, TENS, heat, and cold application. Did discuss trying heat application to see if this would help the patient. The daughter states they have not tried this.  Reviewed with patient prescribed pharmacological and nonpharmacological pain relief strategies. The patient has paid caregiver during the day and he tries to work with him on exercises that the PT did with the patient when they were working with him. The patient lots of times will not do this due to the pain level and increased pain level; Advised patient to discuss unresolved pain, changes in level or intensity of pain, and safety and falls prevention with provider; Review of measures to keep the patient safe and preventing falls. The patient is at high risk for falls due to impaired mobility and history or falls. Has  worked with PT in the past. The daughter denies any new falls with the patient. He is staying in the wheelchair a lot. Education and support given.  Symptom Management: Take medications as prescribed   Attend all scheduled provider appointments Call pharmacy for medication refills 3-7 days in advance of running out of medications call the Suicide and Crisis Lifeline: 988 call the [place] National Suicide Prevention Lifeline: 564-119-8651 or TTY: 234-239-1664 TTY (505)351-4920) to talk to a trained counselor call 1-800-273-TALK  (toll free, 24 hour hotline) if experiencing a Mental Health or [place]   Follow Up Plan: Telephone follow up appointment with care management team member scheduled for: 03-12-2022 at 0900 am          Plan:Telephone follow up appointment with care management team member scheduled for:  03-12-2022 at 0900 am  [place], MSN, CCM RN Care Manager  Chronic Care Management Direct Number: [phone]

## 2022-01-31 ENCOUNTER — Inpatient Hospital Stay
Admission: EM | Admit: 2022-01-31 | Discharge: 2022-02-08 | DRG: 871 | Disposition: A | Discharge status: Skilled Nursing Facility | Payer: Medicare Other | Attending: Internal Medicine | Admitting: Internal Medicine

## 2022-01-31 ENCOUNTER — Ambulatory Visit: Payer: Self-pay

## 2022-01-31 ENCOUNTER — Other Ambulatory Visit: Payer: Self-pay

## 2022-01-31 ENCOUNTER — Emergency Department: Payer: Medicare Other

## 2022-01-31 DIAGNOSIS — Z8249 Family history of ischemic heart disease and other diseases of the circulatory system: Secondary | ICD-10-CM

## 2022-01-31 DIAGNOSIS — G8929 Other chronic pain: Secondary | ICD-10-CM

## 2022-01-31 DIAGNOSIS — I2510 Atherosclerotic heart disease of native coronary artery without angina pectoris: Secondary | ICD-10-CM | POA: Diagnosis present

## 2022-01-31 DIAGNOSIS — Z79899 Other long term (current) drug therapy: Secondary | ICD-10-CM

## 2022-01-31 DIAGNOSIS — R4182 Altered mental status, unspecified: Secondary | ICD-10-CM | POA: Diagnosis present

## 2022-01-31 DIAGNOSIS — K219 Gastro-esophageal reflux disease without esophagitis: Secondary | ICD-10-CM | POA: Diagnosis present

## 2022-01-31 DIAGNOSIS — Z9049 Acquired absence of other specified parts of digestive tract: Secondary | ICD-10-CM

## 2022-01-31 DIAGNOSIS — F32A Depression, unspecified: Secondary | ICD-10-CM | POA: Diagnosis present

## 2022-01-31 DIAGNOSIS — N400 Enlarged prostate without lower urinary tract symptoms: Secondary | ICD-10-CM | POA: Diagnosis present

## 2022-01-31 DIAGNOSIS — I5033 Acute on chronic diastolic (congestive) heart failure: Secondary | ICD-10-CM | POA: Diagnosis present

## 2022-01-31 DIAGNOSIS — M810 Age-related osteoporosis without current pathological fracture: Secondary | ICD-10-CM | POA: Diagnosis present

## 2022-01-31 DIAGNOSIS — G3183 Dementia with Lewy bodies: Secondary | ICD-10-CM | POA: Diagnosis present

## 2022-01-31 DIAGNOSIS — R6510 Systemic inflammatory response syndrome (SIRS) of non-infectious origin without acute organ dysfunction: Secondary | ICD-10-CM | POA: Diagnosis not present

## 2022-01-31 DIAGNOSIS — A419 Sepsis, unspecified organism: Secondary | ICD-10-CM | POA: Diagnosis not present

## 2022-01-31 DIAGNOSIS — I110 Hypertensive heart disease with heart failure: Secondary | ICD-10-CM | POA: Diagnosis present

## 2022-01-31 DIAGNOSIS — J9621 Acute and chronic respiratory failure with hypoxia: Secondary | ICD-10-CM | POA: Diagnosis present

## 2022-01-31 DIAGNOSIS — A4189 Other specified sepsis: Principal | ICD-10-CM | POA: Diagnosis present

## 2022-01-31 DIAGNOSIS — E440 Moderate protein-calorie malnutrition: Secondary | ICD-10-CM | POA: Insufficient documentation

## 2022-01-31 DIAGNOSIS — G35 Multiple sclerosis: Secondary | ICD-10-CM | POA: Diagnosis present

## 2022-01-31 DIAGNOSIS — G20C Parkinsonism, unspecified: Secondary | ICD-10-CM | POA: Diagnosis not present

## 2022-01-31 DIAGNOSIS — J101 Influenza due to other identified influenza virus with other respiratory manifestations: Secondary | ICD-10-CM

## 2022-01-31 DIAGNOSIS — G20A1 Parkinson's disease without dyskinesia, without mention of fluctuations: Secondary | ICD-10-CM | POA: Diagnosis present

## 2022-01-31 DIAGNOSIS — Z87891 Personal history of nicotine dependence: Secondary | ICD-10-CM | POA: Diagnosis not present

## 2022-01-31 DIAGNOSIS — J1000 Influenza due to other identified influenza virus with unspecified type of pneumonia: Secondary | ICD-10-CM | POA: Diagnosis present

## 2022-01-31 DIAGNOSIS — J111 Influenza due to unidentified influenza virus with other respiratory manifestations: Secondary | ICD-10-CM | POA: Diagnosis present

## 2022-01-31 DIAGNOSIS — E876 Hypokalemia: Secondary | ICD-10-CM | POA: Diagnosis present

## 2022-01-31 DIAGNOSIS — I10 Essential (primary) hypertension: Secondary | ICD-10-CM | POA: Diagnosis present

## 2022-01-31 DIAGNOSIS — M159 Polyosteoarthritis, unspecified: Secondary | ICD-10-CM

## 2022-01-31 DIAGNOSIS — F0282 Dementia in other diseases classified elsewhere, unspecified severity, with psychotic disturbance: Secondary | ICD-10-CM | POA: Diagnosis present

## 2022-01-31 DIAGNOSIS — F0280 Dementia in other diseases classified elsewhere without behavioral disturbance: Secondary | ICD-10-CM | POA: Diagnosis not present

## 2022-01-31 DIAGNOSIS — E785 Hyperlipidemia, unspecified: Secondary | ICD-10-CM | POA: Diagnosis present

## 2022-01-31 DIAGNOSIS — Z6828 Body mass index (BMI) 28.0-28.9, adult: Secondary | ICD-10-CM

## 2022-01-31 DIAGNOSIS — Z806 Family history of leukemia: Secondary | ICD-10-CM

## 2022-01-31 DIAGNOSIS — G4700 Insomnia, unspecified: Secondary | ICD-10-CM | POA: Diagnosis present

## 2022-01-31 DIAGNOSIS — J189 Pneumonia, unspecified organism: Secondary | ICD-10-CM | POA: Diagnosis present

## 2022-01-31 DIAGNOSIS — Z888 Allergy status to other drugs, medicaments and biological substances status: Secondary | ICD-10-CM

## 2022-01-31 DIAGNOSIS — Z1152 Encounter for screening for COVID-19: Secondary | ICD-10-CM

## 2022-01-31 DIAGNOSIS — Z803 Family history of malignant neoplasm of breast: Secondary | ICD-10-CM | POA: Diagnosis not present

## 2022-01-31 DIAGNOSIS — M1990 Unspecified osteoarthritis, unspecified site: Secondary | ICD-10-CM | POA: Diagnosis present

## 2022-01-31 DIAGNOSIS — J9601 Acute respiratory failure with hypoxia: Secondary | ICD-10-CM

## 2022-01-31 DIAGNOSIS — Z801 Family history of malignant neoplasm of trachea, bronchus and lung: Secondary | ICD-10-CM | POA: Diagnosis not present

## 2022-01-31 DIAGNOSIS — Z515 Encounter for palliative care: Secondary | ICD-10-CM

## 2022-01-31 DIAGNOSIS — R1312 Dysphagia, oropharyngeal phase: Secondary | ICD-10-CM

## 2022-01-31 LAB — RESP PANEL BY RT-PCR (RSV, FLU A&B, COVID)  RVPGX2
Influenza A by PCR: POSITIVE — AB
Influenza B by PCR: NEGATIVE
Resp Syncytial Virus by PCR: NEGATIVE
SARS Coronavirus 2 by RT PCR: NEGATIVE

## 2022-01-31 LAB — URINALYSIS, W/ REFLEX TO CULTURE (INFECTION SUSPECTED)
Bilirubin Urine: NEGATIVE
Glucose, UA: NEGATIVE mg/dL
Hgb urine dipstick: NEGATIVE
Ketones, ur: 5 mg/dL — AB
Leukocytes,Ua: NEGATIVE
Nitrite: NEGATIVE
Protein, ur: NEGATIVE mg/dL
Specific Gravity, Urine: 1.018 (ref 1.005–1.030)
Squamous Epithelial / HPF: NONE SEEN /HPF (ref 0–5)
pH: 5 (ref 5.0–8.0)

## 2022-01-31 LAB — COMPREHENSIVE METABOLIC PANEL
ALT: 6 U/L (ref 0–44)
AST: 17 U/L (ref 15–41)
Albumin: 3.4 g/dL — ABNORMAL LOW (ref 3.5–5.0)
Alkaline Phosphatase: 72 U/L (ref 38–126)
Anion gap: 6 (ref 5–15)
BUN: 20 mg/dL (ref 8–23)
CO2: 27 mmol/L (ref 22–32)
Calcium: 8.3 mg/dL — ABNORMAL LOW (ref 8.9–10.3)
Chloride: 103 mmol/L (ref 98–111)
Creatinine, Ser: 0.79 mg/dL (ref 0.61–1.24)
GFR, Estimated: >60 mL/min (ref >60)
Glucose, Bld: 119 mg/dL — ABNORMAL HIGH (ref 70–99)
Potassium: 3.4 mmol/L — ABNORMAL LOW (ref 3.5–5.1)
Sodium: 136 mmol/L (ref 135–145)
Total Bilirubin: 1.3 mg/dL — ABNORMAL HIGH (ref 0.3–1.2)
Total Protein: 6.7 g/dL (ref 6.5–8.1)

## 2022-01-31 LAB — CBC WITH DIFFERENTIAL/PLATELET
Abs Immature Granulocytes: 0.01 10*3/uL (ref 0.00–0.07)
Basophils Absolute: 0 10*3/uL (ref 0.0–0.1)
Basophils Relative: 1 %
Eosinophils Absolute: 0 10*3/uL (ref 0.0–0.5)
Eosinophils Relative: 0 %
HCT: 34.1 % — ABNORMAL LOW (ref 39.0–52.0)
Hemoglobin: 11.2 g/dL — ABNORMAL LOW (ref 13.0–17.0)
Immature Granulocytes: 0 %
Lymphocytes Relative: 5 %
Lymphs Abs: 0.2 10*3/uL — ABNORMAL LOW (ref 0.7–4.0)
MCH: 29.8 pg (ref 26.0–34.0)
MCHC: 32.8 g/dL (ref 30.0–36.0)
MCV: 90.7 fL (ref 80.0–100.0)
Monocytes Absolute: 0.3 10*3/uL (ref 0.1–1.0)
Monocytes Relative: 7 %
Neutro Abs: 3.7 10*3/uL (ref 1.7–7.7)
Neutrophils Relative %: 87 %
Platelets: 156 10*3/uL (ref 150–400)
RBC: 3.76 MIL/uL — ABNORMAL LOW (ref 4.22–5.81)
RDW: 14.1 % (ref 11.5–15.5)
WBC: 4.2 10*3/uL (ref 4.0–10.5)
nRBC: 0 % (ref 0.0–0.2)

## 2022-01-31 LAB — PROTIME-INR
INR: 1.1 (ref 0.8–1.2)
Prothrombin Time: 13.7 seconds (ref 11.4–15.2)

## 2022-01-31 LAB — APTT: aPTT: 35 seconds (ref 24–36)

## 2022-01-31 LAB — LACTIC ACID, PLASMA: Lactic Acid, Venous: 0.8 mmol/L (ref 0.5–1.9)

## 2022-01-31 LAB — PROCALCITONIN: Procalcitonin: <0.1 ng/mL

## 2022-01-31 MED ORDER — HYDROCOD POLI-CHLORPHE POLI ER 10-8 MG/5ML PO SUER
5.0000 mL | Freq: Every evening | ORAL | Status: DC | PRN
  Administered 2022-02-03: 5 mL via ORAL
  Filled 2022-01-31: qty 5

## 2022-01-31 MED ORDER — ACETAMINOPHEN 325 MG PO TABS: 650.0000 mg | ORAL_TABLET | Freq: Four times a day (QID) | ORAL | Status: DC | PRN

## 2022-01-31 MED ORDER — ONDANSETRON HCL 4 MG PO TABS: 4.0000 mg | ORAL_TABLET | Freq: Four times a day (QID) | ORAL | Status: AC | PRN

## 2022-01-31 MED ORDER — PANTOPRAZOLE SODIUM 40 MG PO TBEC
40.0000 mg | DELAYED_RELEASE_TABLET | Freq: Every day | ORAL | Status: DC
  Administered 2022-02-01 – 2022-02-08 (×8): 40 mg via ORAL
  Filled 2022-01-31 (×9): qty 1

## 2022-01-31 MED ORDER — SENNOSIDES-DOCUSATE SODIUM 8.6-50 MG PO TABS
1.0000 | ORAL_TABLET | Freq: Every evening | ORAL | Status: DC | PRN
  Administered 2022-02-03: 1 via ORAL
  Filled 2022-01-31: qty 1

## 2022-01-31 MED ORDER — ADULT MULTIVITAMIN W/MINERALS CH
1.0000 | ORAL_TABLET | Freq: Every day | ORAL | Status: DC
  Administered 2022-02-01 – 2022-02-08 (×8): 1 via ORAL
  Filled 2022-01-31 (×9): qty 1

## 2022-01-31 MED ORDER — POTASSIUM CITRATE-CITRIC ACID 1100-334 MG/5ML PO SOLN
20.0000 meq | Freq: Every day | ORAL | Status: DC
  Administered 2022-01-31 – 2022-02-07 (×8): 20 meq via ORAL
  Filled 2022-01-31 (×9): qty 10

## 2022-01-31 MED ORDER — SODIUM CHLORIDE 0.9 % IV SOLN
500.0000 mg | INTRAVENOUS | Status: DC
  Administered 2022-01-31: 500 mg via INTRAVENOUS
  Filled 2022-01-31: qty 5

## 2022-01-31 MED ORDER — SERTRALINE HCL 50 MG PO TABS
50.0000 mg | ORAL_TABLET | Freq: Every day | ORAL | Status: DC
  Administered 2022-02-01 – 2022-02-08 (×8): 50 mg via ORAL
  Filled 2022-01-31 (×9): qty 1

## 2022-01-31 MED ORDER — VITAMIN C 500 MG PO TABS
500.0000 mg | ORAL_TABLET | Freq: Every day | ORAL | Status: DC
  Administered 2022-02-01 – 2022-02-08 (×8): 500 mg via ORAL
  Filled 2022-01-31 (×9): qty 1

## 2022-01-31 MED ORDER — ALPRAZOLAM 0.5 MG PO TABS
0.5000 mg | ORAL_TABLET | Freq: Two times a day (BID) | ORAL | Status: AC | PRN
  Administered 2022-02-02 – 2022-02-07 (×2): 0.5 mg via ORAL
  Filled 2022-01-31 (×2): qty 1

## 2022-01-31 MED ORDER — ENOXAPARIN SODIUM 40 MG/0.4ML IJ SOSY
40.0000 mg | PREFILLED_SYRINGE | INTRAMUSCULAR | Status: DC
  Administered 2022-01-31 – 2022-02-07 (×8): 40 mg via SUBCUTANEOUS
  Filled 2022-01-31 (×8): qty 0.4

## 2022-01-31 MED ORDER — SODIUM CHLORIDE 0.9 % IV BOLUS (SEPSIS)
1000.0000 mL | Freq: Once | INTRAVENOUS | Status: AC
  Administered 2022-01-31: 1000 mL via INTRAVENOUS

## 2022-01-31 MED ORDER — HALOPERIDOL LACTATE 5 MG/ML IJ SOLN: 2.0000 mg | Freq: Four times a day (QID) | INTRAMUSCULAR | Status: DC | PRN

## 2022-01-31 MED ORDER — MELATONIN 5 MG PO TABS
10.0000 mg | ORAL_TABLET | Freq: Every evening | ORAL | Status: DC | PRN
  Administered 2022-02-02 – 2022-02-06 (×4): 10 mg via ORAL
  Filled 2022-01-31 (×5): qty 2

## 2022-01-31 MED ORDER — RIVASTIGMINE TARTRATE 3 MG PO CAPS
3.0000 mg | ORAL_CAPSULE | Freq: Two times a day (BID) | ORAL | Status: DC
  Administered 2022-01-31 – 2022-02-08 (×16): 3 mg via ORAL
  Filled 2022-01-31 (×16): qty 1

## 2022-01-31 MED ORDER — ENALAPRIL MALEATE 2.5 MG PO TABS
5.0000 mg | ORAL_TABLET | Freq: Every day | ORAL | Status: DC
  Administered 2022-02-01 – 2022-02-08 (×8): 5 mg via ORAL
  Filled 2022-01-31: qty 2
  Filled 2022-01-31 (×2): qty 1
  Filled 2022-01-31 (×4): qty 2
  Filled 2022-01-31 (×2): qty 1
  Filled 2022-01-31: qty 2

## 2022-01-31 MED ORDER — HYDROCODONE-ACETAMINOPHEN 5-325 MG PO TABS
1.0000 | ORAL_TABLET | Freq: Four times a day (QID) | ORAL | Status: DC | PRN
  Administered 2022-02-06 – 2022-02-07 (×6): 1 via ORAL
  Filled 2022-01-31 (×7): qty 1

## 2022-01-31 MED ORDER — GUAIFENESIN ER 600 MG PO TB12: 600.0000 mg | ORAL_TABLET | Freq: Two times a day (BID) | ORAL | Status: AC | PRN

## 2022-01-31 MED ORDER — ACETAMINOPHEN 325 MG PO TABS
650.0000 mg | ORAL_TABLET | Freq: Four times a day (QID) | ORAL | Status: AC | PRN
  Administered 2022-01-31 – 2022-02-01 (×2): 650 mg via ORAL
  Filled 2022-01-31 (×2): qty 2

## 2022-01-31 MED ORDER — LACTATED RINGERS IV BOLUS (SEPSIS)
1000.0000 mL | Freq: Once | INTRAVENOUS | Status: AC
  Administered 2022-01-31: 1000 mL via INTRAVENOUS

## 2022-01-31 MED ORDER — CARBIDOPA-LEVODOPA ER 50-200 MG PO TBCR
1.0000 | EXTENDED_RELEASE_TABLET | Freq: Four times a day (QID) | ORAL | Status: DC
  Administered 2022-01-31 – 2022-02-08 (×31): 1 via ORAL
  Filled 2022-01-31 (×33): qty 1

## 2022-01-31 MED ORDER — LACTATED RINGERS IV SOLN: INTRAVENOUS | Status: AC

## 2022-01-31 MED ORDER — POTASSIUM CITRATE-CITRIC ACID 1100-334 MG/5ML PO SOLN
20.0000 meq | Freq: Every day | ORAL | Status: DC
  Filled 2022-01-31: qty 10

## 2022-01-31 MED ORDER — ACETAMINOPHEN 10 MG/ML IV SOLN: 1000.0000 mg | Freq: Four times a day (QID) | INTRAVENOUS | Status: AC | PRN

## 2022-01-31 MED ORDER — SODIUM CHLORIDE 0.9 % IV SOLN
2.0000 g | INTRAVENOUS | Status: DC
  Administered 2022-01-31: 2 g via INTRAVENOUS
  Filled 2022-01-31: qty 20

## 2022-01-31 MED ORDER — VITAMIN D 25 MCG (1000 UNIT) PO TABS
1000.0000 [IU] | ORAL_TABLET | Freq: Every day | ORAL | Status: DC
  Administered 2022-02-01 – 2022-02-08 (×8): 1000 [IU] via ORAL
  Filled 2022-01-31 (×9): qty 1

## 2022-01-31 MED ORDER — ACETAMINOPHEN 650 MG RE SUPP: 650.0000 mg | Freq: Four times a day (QID) | RECTAL | Status: AC | PRN

## 2022-01-31 MED ORDER — HYDRALAZINE HCL 20 MG/ML IJ SOLN: 5.0000 mg | Freq: Three times a day (TID) | INTRAMUSCULAR | Status: AC | PRN

## 2022-01-31 MED ORDER — DICLOFENAC SODIUM 1 % EX GEL: 2.0000 g | Freq: Four times a day (QID) | CUTANEOUS | Status: DC | PRN

## 2022-01-31 MED ORDER — ONDANSETRON HCL 4 MG/2ML IJ SOLN: 4.0000 mg | Freq: Four times a day (QID) | INTRAMUSCULAR | Status: AC | PRN

## 2022-01-31 NOTE — Assessment & Plan Note (Signed)
-  PPI

## 2022-01-31 NOTE — Assessment & Plan Note (Signed)
-  Rivastigmine resumed

## 2022-01-31 NOTE — ED Notes (Signed)
Report given to Rachel RN

## 2022-01-31 NOTE — Assessment & Plan Note (Addendum)
-  Patient had elevated heart rate, Tmax documented in the hospital at 101.1 - At bedside patient does not appear to have clinical picture of sepsis - Patient tested positive for influenza A by PCR - Possible right upper lobe pneumonia - Check procalcitonin - Continue with azithromycin 500 mg IV daily, ceftriaxone 2 g IV daily for 5 days - Admit to PCU, inpatient

## 2022-01-31 NOTE — ED Triage Notes (Signed)
Pt arrives via EMS from home for AMS that started yesterday- per family t was having hallucinations yesterday at 3pm- pt has also had a cough, fever, and SHOB recently- per EMS pt has diminished breath sounds bilaterally- pt does have a hx of dementia- pt was given tylenol and vicodine this AM by family

## 2022-01-31 NOTE — H&P (Addendum)
Addendum:  Procalcitonin resulted as negative, patient remained afebrile with appropriate medication - I suspect patient's fever is secondary to influenza A - Azithromycin and ceftriaxone has been discontinued  History and Physical   CECILIO [name] WJX:[id number] DOB: 1934-06-22 DOA: 01/31/2022  PCP: [name], DO  Outpatient Specialists: Dr. Shah, [facility] neurology Patient coming from: Home via EMS  I have personally briefly reviewed patient's old medical records in [place].  Chief Concern: Confusion  HPI: Randy [name] is a 88 year old male with history of lewy body dementia, hypertension, depression, insomnia, Parkinson's, who presents emergency room for chief concerns of confusion.  Per ED report, EMS found patient with SpO2 of 88% on room air, patient was placed on 2 L nasal cannula with improvement.  Initial vitals in the ED showed temperature of 101.1, respiration rate of 14, heart rate of 67, blood pressure 132/68, SpO2 of 97% on 4 L nasal cannula.  Serum sodium is 136, potassium 3.4, chloride 103, bicarb 27, BUN of 20, serum creatinine 0.76, EGFR greater than 60, nonfasting blood glucose 119, WBC 4.2, hemoglobin 11.2, platelets of 156.  Lactic acid is 0.8.  Patient tested positive for influenza A by PCR.  Blood cultures x 2 are in process.  ED treatment: Azithromycin 500 mg IV every 24 hours, 5 days ordered; ceftriaxone 2 g IV every 24 hours, 5 days ordered.  Sodium chloride 1 L bolus.  LR infusion at 150 mL/h. ---------------------------------- At bedside, he was able to tell me his name, hospital, and he states he is 29 and then 88 years old. He is shaking chills.   Daughter, Randy [name], states that he started hallucinating and was confused. She states he is seeing people/things that were not there. She state the hallucination started on Friday and worse on Saturday. He improved on Sunday. Then on Wednesday the hallucination came back along with  decreased mobility.   She endorses fever, they did not check the temperature on Wednesday (01/30/22). They gave him tylenol and it improved. They did not check his temperature.  Daughter reports that he has had increased cough with white sputum.  Social history: He lives with his wife and daughter. She denies tobacco, etoh, recreational drug use. He is retired and formerly worked as an Clinical biochemist.   ROS: unable to complete due to dementia  ED Course: Discussed with emergency medicine provider, patient requiring hospitalization for chief concerns of possible sepsis.  Assessment/Plan  Principal Problem:   SIRS (systemic inflammatory response syndrome) (HCC) Active Problems:   Altered mental status   Parkinsonism   Lewy body dementia without behavioral disturbance (HCC)   Essential hypertension   GERD (gastroesophageal reflux disease)   Influenza A   Assessment and Plan:  * SIRS (systemic inflammatory response syndrome) (HCC) - Patient had elevated heart rate, Tmax documented in the hospital at 101.1 - At bedside patient does not appear to have clinical picture of sepsis - Patient tested positive for influenza A by PCR - Possible right upper lobe pneumonia - Check procalcitonin - Continue with azithromycin 500 mg IV daily, ceftriaxone 2 g IV daily for 5 days - Admit to PCU, inpatient  Parkinsonism - Rivastigmine 3 mg p.o. 3 times daily, carbidopa-levodopa 50-200 mg p.o. 4 times daily  Lewy body dementia without behavioral disturbance (HCC) - Rivastigmine resumed  Influenza A - Patient is outside window for antiviral benefits - Symptomatic support with LR 125 mL/h, 20 hours ordered - Patient is status post LR 1 L bolus,  sodium chloride 1 L bolus - Acetaminophen 650 mg p.o. every 6 hours as needed for mild pain fever, acetaminophen 650 rectal, every 6 hours.  For mild pain and fever - Acetaminophen 1000 mg IV every 6 hours as needed for muscle aches not relieved with p.o.  acetaminophen, 1 day ordered  GERD (gastroesophageal reflux disease) - PPI  Essential hypertension - Enalapril 5 mg daily resumed for 02/01/2022 - Hydralazine 5 mg IV every 8 hours as needed for SBP greater than 180, 4 days ordered  Chart reviewed.   DVT prophylaxis: Enoxaparin 40 mg subcutaneous Code Status: Full code Diet: Heart healthy Family Communication: Updated daughter, Randy [name] at bedside Disposition Plan: Pending clinical course Consults called: None at this time Admission status: PCU, inpatient  Past Medical History:  Diagnosis Date   Arthritis    GERD (gastroesophageal reflux disease)    Hyperlipidemia    Hypertension    Lewy body dementia ([place])    Osteoporosis    Prostate enlargement    Past Surgical History:  Procedure Laterality Date   CHOLECYSTECTOMY N/A 09/10/2018   Procedure: LAPAROSCOPIC CHOLECYSTECTOMY;  Surgeon: Benjamine Sprague, DO;  Location: [facility] ORS;  Service: General;  Laterality: N/A;   CYSTOSCOPY WITH LITHOLAPAXY N/A 10/20/2020   Procedure: CYSTOSCOPY WITH LITHOLAPAXY;  Surgeon: [name], MD;  Location: [facility] ORS;  Service: Urology;  Laterality: N/A;   ESOPHAGOGASTRODUODENOSCOPY (EGD) WITH PROPOFOL N/A 10/11/2021   Procedure: ESOPHAGOGASTRODUODENOSCOPY (EGD) WITH PROPOFOL;  Surgeon: Lin Landsman, MD;  Location: [facility] ENDOSCOPY;  Service: Gastroenterology;  Laterality: N/A;   GANGLION CYST EXCISION Left 2013   Left upper arm   HERNIA REPAIR N/A 2008   [place], ventral acquired hernia repair   HOLEP-LASER ENUCLEATION OF THE PROSTATE WITH MORCELLATION N/A 11/06/2018   Procedure: HOLEP-LASER ENUCLEATION OF THE PROSTATE WITH MORCELLATION;  Surgeon: [name], MD;  Location: [facility] ORS;  Service: Urology;  Laterality: N/A;   HOLEP-LASER ENUCLEATION OF THE PROSTATE WITH MORCELLATION N/A 10/20/2020   Procedure: HOLEP-LASER ENUCLEATION OF THE PROSTATE WITH MORCELLATION;  Surgeon: [name], MD;  Location: [facility] ORS;  Service: Urology;   Laterality: N/A;   Social History:  reports that he quit smoking about 56 years ago. His smoking use included cigarettes. He has a 12.00 pack-year smoking history. He has never used smokeless tobacco. He reports that he does not drink alcohol and does not use drugs.  Allergies  Allergen Reactions   Antazoline Diarrhea   Antihistamines, Chlorpheniramine-Type Other (See Comments)    Can't urinate   Family History  Problem Relation Age of Onset   Breast cancer Mother    Lung cancer Father    Heart disease Brother    Leukemia Brother    Prostate cancer Neg Hx    Colon cancer Neg Hx    Bladder Cancer Neg Hx    Kidney cancer Neg Hx    Family history: Family history reviewed and not pertinent.  Prior to Admission medications   Medication Sig Start Date End Date Taking? Authorizing Provider  acetaminophen (TYLENOL) 500 MG tablet Take 1,000 mg by mouth every 6 (six) hours as needed for moderate pain.    [provider]  carbidopa-levodopa (SINEMET CR) 50-200 MG tablet Take 1 tablet by mouth 4 (four) times daily. 06/22/20   [provider]  cholecalciferol (VITAMIN D) 1000 units tablet Take 1,000 Units by mouth daily.    [provider]  Coenzyme Q10 (CO Q 10) 100 MG CAPS Take 100 mg by mouth daily.  [provider]  diclofenac Sodium (VOLTAREN) 1 % GEL APPLY 2 G TOPICALLY 4 (FOUR) TIMES DAILY AS NEEDED. TO KNEE ARTHRITIS PAIN 09/28/20   [name], DO  enalapril (VASOTEC) 5 MG tablet TAKE 1 TABLET (5 MG TOTAL) BY MOUTH DAILY. 01/28/22   Karamalegos, Devonne Doughty, DO  fluticasone (FLONASE) 50 MCG/ACT nasal spray Place 2 sprays into both nostrils as needed for allergies.    [provider]  furosemide (LASIX) 20 MG tablet Take 1 tablet (20 mg total) by mouth every other day. Patient taking differently: Take 20 mg by mouth every other day. Patient takes Mondays, Wednesdays, and Fridays. 07/06/21   Kate Sable, MD   HYDROcodone-acetaminophen (NORCO/VICODIN) 5-325 MG tablet Take 1 tablet by mouth every 6 (six) hours as needed for moderate pain. 01/22/22   Karamalegos, Alexander J, DO  melatonin 5 MG TABS Take 1 tablet (5 mg total) by mouth at bedtime. 07/31/21   Lorella Nimrod, MD  Multiple Vitamin (MULTIVITAMIN WITH MINERALS) TABS tablet Take 1 tablet by mouth daily. 10/12/19   [name], MD  Omega-3 Fatty Acids (FISH OIL) 1200 MG CAPS Take 1,200 mg by mouth in the morning, at noon, in the evening, and at bedtime.    [provider]  omeprazole (PRILOSEC) 20 MG capsule Take 20 mg by mouth 2 (two) times daily.    [provider]  ondansetron (ZOFRAN-ODT) 4 MG disintegrating tablet Take 1 tablet (4 mg total) by mouth every 8 (eight) hours as needed for nausea or vomiting. 01/04/22   Parks Ranger, Devonne Doughty, DO  rivastigmine (EXELON) 3 MG capsule Take 3 mg by mouth 2 (two) times daily.    [provider]  sertraline (ZOLOFT) 50 MG tablet Take 50 mg by mouth daily. 06/22/20   [provider]  vitamin B-12 1000 MCG tablet Take 1 tablet (1,000 mcg total) by mouth daily. 08/01/21   Lorella Nimrod, MD  vitamin C (ASCORBIC ACID) 500 MG tablet Take 500 mg by mouth daily.    [provider]   Physical Exam: Vitals:   01/31/22 1500 01/31/22 1529 01/31/22 1530 01/31/22 1600  BP: (!) 148/75  108/81 117/89  Pulse: 69  65 78  Resp: 18  18 (!) 22  Temp:  98.4 F (36.9 C)    TempSrc:  Oral    SpO2: 100%  95% 98%  Weight:      Height:       Constitutional: appears age-appropriate, frail, NAD, calm, comfortable Eyes: PERRL, lids and conjunctivae normal ENMT: Mucous membranes are moist. Posterior pharynx clear of any exudate or lesions. Age-appropriate dentition. Hearing appropriate Neck: normal, supple, no masses, no thyromegaly Respiratory: clear to auscultation bilaterally, no wheezing, no crackles. Normal respiratory effort. No accessory muscle use.  Cardiovascular:  Regular rate and rhythm, no murmurs / rubs / gallops.  Bilateral trace lower extremity edema. 2+ pedal pulses. No carotid bruits.  Abdomen: Obese abdomen, no tenderness, no masses palpated, no hepatosplenomegaly. Bowel sounds positive.  Musculoskeletal: no clubbing / cyanosis. No joint deformity upper and lower extremities. Good ROM, no contractures, no atrophy. Normal muscle tone.  Skin: Generalized bilateral upper extremity ecchymosis, consistent with geriatric derm Neurologic: Sensation intact. Strength 5/5 in all 4.  Psychiatric: Lacks judgment and insight consistent with dementia diagnosis. Alert and oriented x self and location. Normal mood.   EKG: independently reviewed, showing sinus rhythm with rate of 71, QTc 532  Chest x-ray on Admission: I personally reviewed and I agree with radiologist reading as  below.  DG Chest Port 1 View  Result Date: 01/31/2022 CLINICAL DATA:  Questionable sepsis, altered mental status, hallucinations EXAM: PORTABLE CHEST 1 VIEW COMPARISON:  Portable exam 1057 hours compared to 11/05/2021 FINDINGS: Enlargement of cardiac silhouette with pulmonary vascular congestion. Atherosclerotic calcification aorta. Increased interstitial markings in both lungs greatest in RIGHT upper lobe, could represent minimal asymmetric pulmonary edema or infection. Subsegmental atelectasis LEFT base medially. No pleural effusion or pneumothorax. Osseous structures unremarkable. IMPRESSION: Enlargement of cardiac silhouette with pulmonary vascular congestion. Accentuated interstitial markings especially RIGHT upper lobe, question minimal aseptic pulmonary edema or infection. Aortic Atherosclerosis (ICD10-I70.0). Electronically Signed   By: Lavonia Dana M.D.   On: 01/31/2022 11:06    Labs on Admission: I have personally reviewed following labs  CBC: Recent Labs  Lab 01/31/22 1031  WBC 4.2  NEUTROABS 3.7  HGB 11.2*  HCT 34.1*  MCV 90.7  PLT 754   Basic Metabolic Panel: Recent  Labs  Lab 01/31/22 1031  NA 136  K 3.4*  CL 103  CO2 27  GLUCOSE 119*  BUN 20  CREATININE 0.79  CALCIUM 8.3*   GFR: Estimated Creatinine Clearance: 80 mL/min (by C-G formula based on SCr of 0.79 mg/dL).  Liver Function Tests: Recent Labs  Lab 01/31/22 1031  AST 17  ALT 6  ALKPHOS 72  BILITOT 1.3*  PROT 6.7  ALBUMIN 3.4*   Coagulation Profile: Recent Labs  Lab 01/31/22 1031  INR 1.1   Urine analysis:    Component Value Date/Time   COLORURINE YELLOW (A) 01/31/2022 1446   APPEARANCEUR CLEAR (A) 01/31/2022 1446   APPEARANCEUR Cloudy (A) 08/17/2020 1024   LABSPEC 1.018 01/31/2022 1446   PHURINE 5.0 01/31/2022 1446   GLUCOSEU NEGATIVE 01/31/2022 1446   HGBUR NEGATIVE 01/31/2022 1446   BILIRUBINUR NEGATIVE 01/31/2022 1446   BILIRUBINUR Negative 08/17/2020 1024   BILIRUBINUR negative 04/17/2015 0826   KETONESUR 5 (A) 01/31/2022 1446   PROTEINUR NEGATIVE 01/31/2022 1446   UROBILINOGEN 0.2 08/09/2020 1319   UROBILINOGEN Normal 04/17/2015 0826   NITRITE NEGATIVE 01/31/2022 1446   LEUKOCYTESUR NEGATIVE 01/31/2022 1446   LEUKOCYTESUR negative 04/17/2015 0826   This document was prepared using Dragon Voice Recognition software and may include unintentional dictation errors.  Dr. Tobie Poet [facility]  If 7PM-7AM, please contact overnight-coverage provider If 7AM-7PM, please contact day coverage provider [email or web address]  01/31/2022, 5:49 PM

## 2022-01-31 NOTE — Telephone Encounter (Signed)
Reviewed triage notes and agree with EMS given severity of his symptoms.  [name], [facility] 01/31/2022, 11:50 AM

## 2022-01-31 NOTE — Assessment & Plan Note (Signed)
-  Rivastigmine 3 mg p.o. 3 times daily, carbidopa-levodopa 50-200 mg p.o. 4 times daily

## 2022-01-31 NOTE — Sepsis Progress Note (Signed)
Sepsis protocol monitored by eLink 

## 2022-01-31 NOTE — Consult Note (Signed)
CODE SEPSIS - PHARMACY COMMUNICATION  **Broad Spectrum Antibiotics should be administered within 1 hour of Sepsis diagnosis**  Time Code Sepsis Called/Page Received: 1032  Antibiotics Ordered: ceftriaxone + azithromycin  Time of 1st antibiotic administration: [place] ,PharmD Clinical Pharmacist  01/31/2022  11:07 AM

## 2022-01-31 NOTE — Telephone Encounter (Signed)
Call from [place]  - Pt's daughter - not with pt.  [name] called Gerald Stabs - son, with pt and had 3 way conversation. Chris phone number [phone].   Chief Complaint: Pt is having visual hallucinations, Fever, SOB Symptoms: Grabbing at things not there, seeing things not there. Difficulty breathing fever. Unsteady on his feet. Frequency: Ongoing - much worse yesterday afternoon Pertinent Negatives: Patient denies  Disposition: XED /'[]'$ Urgent Care (no appt availability in office) / '[]'$ Appointment(In office/virtual)/ '[]'$  [facility]/ '[]'$ Home Care/ '[]'$ Refused Recommended Disposition /'[]'$ [facility]/ '[]'$  Follow-up with PCP Additional Notes: Call from daughter [name], who is not with pt. [name] reporting pt is SOB and has fever. [name] added Gerald Stabs to the phone call. Gerald Stabs reports hallucinations, SOB, wheezing and inability to stand. EMS called. Daughter and Gerald Stabs will be with pt.   Reason for Disposition  Difficult to awaken or acting confused (e.g., disoriented, slurred speech)  Answer Assessment - Initial Assessment Questions 1. RESPIRATORY STATUS: "Describe your breathing?" (e.g., wheezing, shortness of breath, unable to speak, severe coughing)      Wheezing 2. ONSET: "When did this breathing problem begin?"      A couple of days 3. PATTERN "Does the difficult breathing come and go, or has it been constant since it started?"      Constant 4. SEVERITY: "How bad is your breathing?" (e.g., mild, moderate, severe)    - MILD: No SOB at rest, mild SOB with walking, speaks normally in sentences, can lie down, no retractions, pulse < 100.    - MODERATE: SOB at rest, SOB with minimal exertion and prefers to sit, cannot lie down flat, speaks in phrases, mild retractions, audible wheezing, pulse 100-120.    - SEVERE: Very SOB at rest, speaks in single words, struggling to breathe, sitting hunched forward, retractions, pulse > 120      moderate 5. RECURRENT SYMPTOM: "Have you had  difficulty breathing before?" If Yes, ask: "When was the last time?" and "What happened that time?"      Has parkinson's and lewie 6. CARDIAC HISTORY: "Do you have any history of heart disease?" (e.g., heart attack, angina, bypass surgery, angioplasty)      no 7. LUNG HISTORY: "Do you have any history of lung disease?"  (e.g., pulmonary embolus, asthma, emphysema)     no 8. CAUSE: "What do you think is causing the breathing problem?"      yes 9. OTHER SYMPTOMS: "Do you have any other symptoms? (e.g., dizziness, runny nose, cough, chest pain, fever)     Hallucinations 10. O2 SATURATION MONITOR:  "Do you use an oxygen saturation monitor (pulse oximeter) at home?" If Yes, ask: "What is your reading (oxygen level) today?" "What is your usual oxygen saturation reading?" (e.g., 95%)        11. PREGNANCY: "Is there any chance you are pregnant?" "When was your last menstrual period?"       na 12. TRAVEL: "Have you traveled out of the country in the last month?" (e.g., travel history, exposures)  Protocols used: Breathing Difficulty-A-AH

## 2022-01-31 NOTE — ED Provider Notes (Signed)
[facility] Provider Note   Event Date/Time   First MD Initiated Contact with Patient 01/31/22 1025     (approximate) History  Altered Mental Status  HPI Randy [name] is a 88 y.o. male with a past medical history of multiple sclerosis and Parkinson's dementia who presents from home via EMS with concerns of altered mental status with patient starting to have hallucinations yesterday approximately 3pm patient also has had cough, fever, and shortness of breath recently.  Patient was given Tylenol by family prior to arrival.   ROS: Patient is only oriented to self and therefore is unable to dissipate in further history review of systems at this time   Physical Exam  Triage Vital Signs: ED Triage Vitals  Enc Vitals Group     BP --      Pulse --      Resp 01/31/22 1021 (!) 22     Temp --      Temp src --      SpO2 01/31/22 1020 (!) 88 %     Weight 01/31/22 1024 213 lb 8 oz (96.8 kg)     Height 01/31/22 1023 6' (1.829 m)     Head Circumference --      Peak Flow --      Pain Score 01/31/22 1023 0     Pain Loc --      Pain Edu? --      Excl. in [place]? --    Most recent vital signs: Vitals:   01/31/22 1530 01/31/22 1600  BP: 108/81 117/89  Pulse: 65 78  Resp: 18 (!) 22  Temp:    SpO2: 95% 98%   General: Awake, oriented x4. CV:  Good peripheral perfusion.  Resp:  Normal effort.  Abd:  No distention.  Other:  Elderly the Caucasian male laying in bed in no acute distress ED Results / Procedures / Treatments  Labs (all labs ordered are listed, but only abnormal results are displayed) Labs Reviewed  RESP PANEL BY RT-PCR (RSV, FLU A&B, COVID)  [id number] - Abnormal; Notable for the following components:      Result Value   Influenza A by PCR POSITIVE (*)    All other components within normal limits  COMPREHENSIVE METABOLIC PANEL - Abnormal; Notable for the following components:   Potassium 3.4 (*)    Glucose, Bld 119 (*)    Calcium 8.3 (*)    Albumin 3.4  (*)    Total Bilirubin 1.3 (*)    All other components within normal limits  CBC WITH DIFFERENTIAL/PLATELET - Abnormal; Notable for the following components:   RBC 3.76 (*)    Hemoglobin 11.2 (*)    HCT 34.1 (*)    Lymphs Abs 0.2 (*)    All other components within normal limits  URINALYSIS, W/ REFLEX TO CULTURE (INFECTION SUSPECTED) - Abnormal; Notable for the following components:   Color, Urine YELLOW (*)    APPearance CLEAR (*)    Ketones, ur 5 (*)    Bacteria, UA RARE (*)    All other components within normal limits  CULTURE, BLOOD (ROUTINE X 2)  CULTURE, BLOOD (ROUTINE X 2)  LACTIC ACID, PLASMA  PROTIME-INR  APTT  PROCALCITONIN   EKG ED ECG REPORT I, Naaman Plummer, the attending physician, personally viewed and interpreted this ECG. Date: 01/31/2022 EKG Time: 1037 Rate: 71 Rhythm: normal sinus rhythm QRS Axis: normal Intervals: normal ST/T Wave abnormalities: normal Narrative Interpretation: no evidence of acute ischemia  RADIOLOGY ED MD interpretation: Single view portable chest x-ray interpreted independently by me shows enlargement of cardiac silhouette with pulmonary vascular congestion and an accentuated interstitial markings especially right upper lobe -Agree with radiology assessment Official radiology report(s): DG Chest Port 1 View  Result Date: 01/31/2022 CLINICAL DATA:  Questionable sepsis, altered mental status, hallucinations EXAM: PORTABLE CHEST 1 VIEW COMPARISON:  Portable exam 1057 hours compared to 11/05/2021 FINDINGS: Enlargement of cardiac silhouette with pulmonary vascular congestion. Atherosclerotic calcification aorta. Increased interstitial markings in both lungs greatest in RIGHT upper lobe, could represent minimal asymmetric pulmonary edema or infection. Subsegmental atelectasis LEFT base medially. No pleural effusion or pneumothorax. Osseous structures unremarkable. IMPRESSION: Enlargement of cardiac silhouette with pulmonary vascular congestion.  Accentuated interstitial markings especially RIGHT upper lobe, question minimal aseptic pulmonary edema or infection. Aortic Atherosclerosis (ICD10-I70.0). Electronically Signed   By: Lavonia Dana M.D.   On: 01/31/2022 11:06   PROCEDURES: Critical Care performed: Yes, see critical care procedure note(s) .1-3 Lead EKG Interpretation  Performed by: Naaman Plummer, MD Authorized by: Naaman Plummer, MD     Interpretation: normal     ECG rate:  71   ECG rate assessment: normal     Rhythm: sinus rhythm     Ectopy: none     Conduction: normal   CRITICAL CARE Performed by: Naaman Plummer  Total critical care time: 33 minutes  Critical care time was exclusive of separately billable procedures and treating other patients.  Critical care was necessary to treat or prevent imminent or life-threatening deterioration.  Critical care was time spent personally by me on the following activities: development of treatment plan with patient and/or surrogate as well as nursing, discussions with consultants, evaluation of patient's response to treatment, examination of patient, obtaining history from patient or surrogate, ordering and performing treatments and interventions, ordering and review of laboratory studies, ordering and review of radiographic studies, pulse oximetry and re-evaluation of patient's condition.  MEDICATIONS ORDERED IN ED: Medications  lactated ringers infusion ( Intravenous Rate/Dose Change 01/31/22 1605)  cefTRIAXone (ROCEPHIN) 2 g in sodium chloride 0.9 % 100 mL IVPB (0 g Intravenous Stopped 01/31/22 1118)  azithromycin (ZITHROMAX) 500 mg in sodium chloride 0.9 % 250 mL IVPB (0 mg Intravenous Stopped 01/31/22 1230)  acetaminophen (TYLENOL) tablet 650 mg (650 mg Oral Given 01/31/22 1342)    Or  acetaminophen (TYLENOL) suppository 650 mg ( Rectal See Alternative 01/31/22 1342)  ondansetron (ZOFRAN) tablet 4 mg (has no administration in time range)    Or  ondansetron (ZOFRAN) injection 4  mg (has no administration in time range)  enoxaparin (LOVENOX) injection 40 mg (has no administration in time range)  senna-docusate (Senokot-S) tablet 1 tablet (has no administration in time range)  ALPRAZolam (XANAX) tablet 0.5 mg (has no administration in time range)  haloperidol lactate (HALDOL) injection 2 mg (has no administration in time range)  acetaminophen (OFIRMEV) IV 1,000 mg (has no administration in time range)  HYDROcodone-acetaminophen (NORCO/VICODIN) 5-325 MG per tablet 1 tablet (has no administration in time range)  enalapril (VASOTEC) tablet 5 mg (has no administration in time range)  rivastigmine (EXELON) capsule 3 mg (has no administration in time range)  sertraline (ZOLOFT) tablet 50 mg (has no administration in time range)  pantoprazole (PROTONIX) EC tablet 40 mg (has no administration in time range)  melatonin tablet 10 mg (has no administration in time range)  carbidopa-levodopa (SINEMET CR) 50-200 MG per tablet controlled release 1 tablet (has no administration in time range)  cholecalciferol (VITAMIN D3) 25 MCG (1000 UNIT) tablet 1,000 Units (has no administration in time range)  multivitamin with minerals tablet 1 tablet (has no administration in time range)  ascorbic acid (VITAMIN C) tablet 500 mg (has no administration in time range)  diclofenac Sodium (VOLTAREN) 1 % topical gel 2 g (has no administration in time range)  hydrALAZINE (APRESOLINE) injection 5 mg (has no administration in time range)  sodium chloride 0.9 % bolus 1,000 mL (0 mLs Intravenous Stopped 01/31/22 1225)  lactated ringers bolus 1,000 mL (0 mLs Intravenous Stopped 01/31/22 1449)   IMPRESSION / MDM / ASSESSMENT AND PLAN / ED COURSE  I reviewed the triage vital signs and the nursing notes.                             The patient is on the cardiac monitor to evaluate for evidence of arrhythmia and/or significant heart rate changes. Patient's presentation is most consistent with acute  presentation with potential threat to life or bodily function. Presentation most consistent with Viral Syndrome.  Patient has tested positive for influenza A At this time patient is requiring submental oxygenation due to acute hypoxic respiratory failure.  Given History and Exam I have a lower suspicion for: Emergent CardioPulmonary causes [such as Acute Asthma or COPD Exacerbation, acute Heart Failure or exacerbation, PE, PTX, atypical ACS, PNA]. Emergent Otolaryngeal causes [such as PTA, RPA, [place], Epiglottitis, EBV].  Regarding Emergent Travel or Immunosuppressive related infectious: I have a low suspicion for acute HIV.  Given radiologic evidence for patchy bilateral airspace opacities concerning for viral pneumonia, continued need for supplemental oxygenation due to acute hypoxic respiratory failure, and need for further evaluation and management, patient will require admission   FINAL CLINICAL IMPRESSION(S) / ED DIAGNOSES   Final diagnoses:  Altered mental status, unspecified altered mental status type  Upper respiratory tract infection due to influenza A virus  Acute respiratory failure with hypoxia ([place])   Rx / DC Orders   ED Discharge Orders     None      Note:  This document was prepared using Dragon voice recognition software and may include unintentional dictation errors.   Naaman Plummer, MD 01/31/22 [phone]

## 2022-01-31 NOTE — Assessment & Plan Note (Signed)
-  Enalapril 5 mg daily resumed for 02/01/2022 - Hydralazine 5 mg IV every 8 hours as needed for SBP greater than 180, 4 days ordered

## 2022-01-31 NOTE — Assessment & Plan Note (Signed)
-  Patient is outside window for antiviral benefits - Symptomatic support with LR 125 mL/h, 20 hours ordered - Patient is status post LR 1 L bolus, sodium chloride 1 L bolus - Acetaminophen 650 mg p.o. every 6 hours as needed for mild pain fever, acetaminophen 650 rectal, every 6 hours.  For mild pain and fever - Acetaminophen 1000 mg IV every 6 hours as needed for muscle aches not relieved with p.o. acetaminophen, 1 day ordered

## 2022-02-01 DIAGNOSIS — F0280 Dementia in other diseases classified elsewhere without behavioral disturbance: Secondary | ICD-10-CM

## 2022-02-01 DIAGNOSIS — R4182 Altered mental status, unspecified: Secondary | ICD-10-CM

## 2022-02-01 DIAGNOSIS — R6510 Systemic inflammatory response syndrome (SIRS) of non-infectious origin without acute organ dysfunction: Secondary | ICD-10-CM | POA: Diagnosis not present

## 2022-02-01 DIAGNOSIS — G3183 Dementia with Lewy bodies: Secondary | ICD-10-CM

## 2022-02-01 DIAGNOSIS — J111 Influenza due to unidentified influenza virus with other respiratory manifestations: Secondary | ICD-10-CM | POA: Diagnosis present

## 2022-02-01 DIAGNOSIS — K219 Gastro-esophageal reflux disease without esophagitis: Secondary | ICD-10-CM

## 2022-02-01 DIAGNOSIS — G20C Parkinsonism, unspecified: Secondary | ICD-10-CM | POA: Diagnosis not present

## 2022-02-01 DIAGNOSIS — I10 Essential (primary) hypertension: Secondary | ICD-10-CM

## 2022-02-01 DIAGNOSIS — J101 Influenza due to other identified influenza virus with other respiratory manifestations: Secondary | ICD-10-CM

## 2022-02-01 LAB — CBC
HCT: 33.8 % — ABNORMAL LOW (ref 39.0–52.0)
Hemoglobin: 10.7 g/dL — ABNORMAL LOW (ref 13.0–17.0)
MCH: 29.9 pg (ref 26.0–34.0)
MCHC: 31.7 g/dL (ref 30.0–36.0)
MCV: 94.4 fL (ref 80.0–100.0)
Platelets: 131 10*3/uL — ABNORMAL LOW (ref 150–400)
RBC: 3.58 MIL/uL — ABNORMAL LOW (ref 4.22–5.81)
RDW: 14.4 % (ref 11.5–15.5)
WBC: 5.1 10*3/uL (ref 4.0–10.5)
nRBC: 0 % (ref 0.0–0.2)

## 2022-02-01 LAB — BASIC METABOLIC PANEL
Anion gap: 10 (ref 5–15)
BUN: 22 mg/dL (ref 8–23)
CO2: 23 mmol/L (ref 22–32)
Calcium: 7.8 mg/dL — ABNORMAL LOW (ref 8.9–10.3)
Chloride: 103 mmol/L (ref 98–111)
Creatinine, Ser: 0.76 mg/dL (ref 0.61–1.24)
GFR, Estimated: >60 mL/min (ref >60)
Glucose, Bld: 87 mg/dL (ref 70–99)
Potassium: 3.5 mmol/L (ref 3.5–5.1)
Sodium: 136 mmol/L (ref 135–145)

## 2022-02-01 LAB — MAGNESIUM: Magnesium: 1.8 mg/dL (ref 1.7–2.4)

## 2022-02-01 NOTE — ED Notes (Signed)
Informed RN bed assigned 

## 2022-02-01 NOTE — Progress Notes (Signed)
PROGRESS NOTE    Randy [name]   RAQ:[id number] DOB: 1934/03/30  DOA: 01/31/2022 Date of Service: 02/01/22 PCP: [name], DO     Brief Narrative / Hospital Course:  [name] is a 88 year old male with history of lewy body dementia, hypertension, depression, insomnia, Parkinson's, who presents emergency room for chief concerns of confusion. Onset 5-6 days ago per family w. AMS/confusion, brief improvement then worsening again and family called EMS. Per ED report, EMS found patient with SpO2 of 88% on room air, patient was placed on 2 L nasal cannula with improvement. 01/25: In ED, febrile 101.1, tachypneic alternating w/ normal RR< BP stable, SpO2 96% 4L [place]. Lactic acid is 0.8.  CXR possible RUL PNA. Patient tested positive for influenza A by PCR. Blood cultures x 2 are in process. In ED received Azithromycin 500 mg IV every 24 hours, 5 days ordered; ceftriaxone 2 g IV every 24 hours, 5 days ordered.  Sodium chloride 1 L bolus.  LR infusion at 150 mL/h. Admitted to hospitalist service. Outside window for antiviral benefit. Procal and BCx pending.  01/26: Procalcitonin <0.10. Still on 4L [place] O2 and tachypneic in AM though RR improving into the afternoon. Confused a bit above baseline per daughter.     Consultants:  none  Procedures: none      ASSESSMENT & PLAN:   Principal Problem:   SIRS (systemic inflammatory response syndrome) (HCC) Active Problems:   Altered mental status   Parkinsonism   Lewy body dementia without behavioral disturbance (HCC)   Essential hypertension   GERD (gastroesophageal reflux disease)   Influenza A   SIRS (systemic inflammatory response syndrome) (HCC) Sepsis d/t Influenza A, possible CAP RUL Patient had elevated heart rate and resp rate, Tmax documented in the hospital at 101.1 Today 02/01/22 still w/ high RR but other sepsis parameters have resolved  Continue with azithromycin 500 mg IV daily, ceftriaxone 2 g IV daily for 5  days - procal negative, may d/c abx if BCx also no growth  Admit to PCU, inpatient - deescalate as able to Med Surg once VS hopefully improve   Influenza A Patient is outside window for antiviral benefits Continue symptomatic support  LR 125 mL/h, 20 hours ordered -caution as he does have Echo w/ G1DD documented 06/2020 - monitor fluid status closely  Acetaminophen 650 mg p.o. every 6 hours as needed for mild pain fever, acetaminophen 650 rectal, every 6 hours.  For mild pain and fever. Acetaminophen 1000 mg IV every 6 hours as needed for muscle aches not relieved with p.o. acetaminophen, 1 day ordered  Essential hypertension Enalapril 5 mg daily resumed for 02/01/2022 Hydralazine 5 mg IV every 8 hours as needed for SBP greater than 180, 4 days ordered  GERD (gastroesophageal reflux disease) PPI  Lewy body dementia without behavioral disturbance (HCC) Parkinsonism Rivastigmine 3 mg p.o. 3 times daily Carbidopa-levodopa 50-200 mg p.o. 4 times daily    DVT prophylaxis: lovenox  Pertinent IV fluids/nutrition: LR 125 mL/h - caution as he does have Echo w/ G1DD documented 06/2020 Central lines / invasive devices: none  Code Status: FULL CODE confirmed over the phone w/ daughter 02/01/22 2:13 PM by Dr Sheppard Coil   Current Admission Status: inpatient, PCU  [facility] needs / Dispo plan: none at this time, PT/OT to eval once more stable - per daughter, goal is to get him home, as long as he can stand and maneuver in and out of a wheelchair she ahdhis aide will be  able to care for him.  Barriers to discharge / significant pending items: O2 requirement, IV fluids, await blood cultures and clinical improvement, anticipate discharge in a few days if improves as expected              Subjective / Brief ROS:  Unable to assess pt d/t dementia - speech is clear but he is incoherent. Daughter states this is a bit worse from his baseline   Family Communication: spoke on phone w/ daughter who also  confirms full code.     Objective Findings:  Vitals:   02/01/22 1245 02/01/22 1300 02/01/22 1330 02/01/22 1400  BP:  (!) 145/72 137/75 (!) 154/77  Pulse: (!) 59 (!) 54 (!) 54 (!) 56  Resp: (!) 24 18 (!) 21 (!) 21  Temp:      TempSrc:      SpO2: 95% 95% 95% 96%  Weight:      Height:        Intake/Output Summary (Last 24 hours) at 02/01/2022 1413 Last data filed at 02/01/2022 0901 Gross per 24 hour  Intake 1900 ml  Output --  Net 1900 ml   Filed Weights   01/31/22 1024  Weight: 96.8 kg    Examination:  Physical Exam Constitutional:      General: He is not in acute distress. Cardiovascular:     Rate and Rhythm: Normal rate and regular rhythm.     Heart sounds: Murmur heard.  Pulmonary:     Effort: Pulmonary effort is normal. No respiratory distress.     Breath sounds: Wheezing present.  Abdominal:     General: Abdomen is flat.     Palpations: Abdomen is soft.  Musculoskeletal:        General: No swelling or deformity.     Cervical back: Normal range of motion.  Skin:    General: Skin is warm and dry.  Neurological:     Mental Status: He is alert. He is disoriented.     Cranial Nerves: No cranial nerve deficit.     Coordination: Coordination normal.  Psychiatric:        Mood and Affect: Mood normal.          Scheduled Medications:   ascorbic acid  500 mg Oral Daily   carbidopa-levodopa  1 tablet Oral QID   cholecalciferol  1,000 Units Oral Daily   citric acid-potassium citrate  20 mEq Oral QHS   enalapril  5 mg Oral Daily   enoxaparin (LOVENOX) injection  40 mg Subcutaneous Q24H   multivitamin with minerals  1 tablet Oral Daily   pantoprazole  40 mg Oral Daily   rivastigmine  3 mg Oral BID   sertraline  50 mg Oral Daily    Continuous Infusions:  acetaminophen      PRN Medications:  acetaminophen, acetaminophen **OR** acetaminophen, ALPRAZolam, chlorpheniramine-HYDROcodone, diclofenac Sodium, guaiFENesin, haloperidol lactate, hydrALAZINE,  HYDROcodone-acetaminophen, melatonin, ondansetron **OR** ondansetron (ZOFRAN) IV, senna-docusate  Antimicrobials from admission:  Anti-infectives (From admission, onward)    Start     Dose/Rate Route Frequency Ordered Stop   01/31/22 1030  cefTRIAXone (ROCEPHIN) 2 g in sodium chloride 0.9 % 100 mL IVPB  Status:  Discontinued        2 g 200 mL/hr over 30 Minutes Intravenous Every 24 hours 01/31/22 1028 01/31/22 1747   01/31/22 1030  azithromycin (ZITHROMAX) 500 mg in sodium chloride 0.9 % 250 mL IVPB  Status:  Discontinued        500 mg 250  mL/hr over 60 Minutes Intravenous Every 24 hours 01/31/22 1028 01/31/22 1747           Data Reviewed:  I have personally reviewed the following...  CBC: Recent Labs  Lab 01/31/22 1031 02/01/22 0549  WBC 4.2 5.1  NEUTROABS 3.7  --   HGB 11.2* 10.7*  HCT 34.1* 33.8*  MCV 90.7 94.4  PLT 156 629*   Basic Metabolic Panel: Recent Labs  Lab 01/31/22 1031 02/01/22 0549  NA 136 136  K 3.4* 3.5  CL 103 103  CO2 27 23  GLUCOSE 119* 87  BUN 20 22  CREATININE 0.79 0.76  CALCIUM 8.3* 7.8*  MG  --  1.8   GFR: Estimated Creatinine Clearance: 80 mL/min (by C-G formula based on SCr of 0.76 mg/dL). Liver Function Tests: Recent Labs  Lab 01/31/22 1031  AST 17  ALT 6  ALKPHOS 72  BILITOT 1.3*  PROT 6.7  ALBUMIN 3.4*   No results for input(s): "LIPASE", "AMYLASE" in the last 168 hours. No results for input(s): "AMMONIA" in the last 168 hours. Coagulation Profile: Recent Labs  Lab 01/31/22 1031  INR 1.1   Cardiac Enzymes: No results for input(s): "CKTOTAL", "CKMB", "CKMBINDEX", "TROPONINI" in the last 168 hours. BNP (last 3 results) No results for input(s): "PROBNP" in the last 8760 hours. HbA1C: No results for input(s): "HGBA1C" in the last 72 hours. CBG: No results for input(s): "GLUCAP" in the last 168 hours. Lipid Profile: No results for input(s): "CHOL", "HDL", "LDLCALC", "TRIG", "CHOLHDL", "LDLDIRECT" in the last 72  hours. Thyroid Function Tests: No results for input(s): "TSH", "T4TOTAL", "FREET4", "T3FREE", "THYROIDAB" in the last 72 hours. Anemia Panel: No results for input(s): "VITAMINB12", "FOLATE", "FERRITIN", "TIBC", "IRON", "RETICCTPCT" in the last 72 hours. Most Recent Urinalysis On File:     Component Value Date/Time   COLORURINE YELLOW (A) 01/31/2022 1446   APPEARANCEUR CLEAR (A) 01/31/2022 1446   APPEARANCEUR Cloudy (A) 08/17/2020 1024   LABSPEC 1.018 01/31/2022 1446   PHURINE 5.0 01/31/2022 1446   GLUCOSEU NEGATIVE 01/31/2022 1446   HGBUR NEGATIVE 01/31/2022 1446   BILIRUBINUR NEGATIVE 01/31/2022 1446   BILIRUBINUR Negative 08/17/2020 1024   BILIRUBINUR negative 04/17/2015 0826   KETONESUR 5 (A) 01/31/2022 1446   PROTEINUR NEGATIVE 01/31/2022 1446   UROBILINOGEN 0.2 08/09/2020 1319   UROBILINOGEN Normal 04/17/2015 0826   NITRITE NEGATIVE 01/31/2022 1446   LEUKOCYTESUR NEGATIVE 01/31/2022 1446   LEUKOCYTESUR negative 04/17/2015 0826   Sepsis Labs: '@LABRCNTIP'$ (procalcitonin:4,lacticidven:4) Microbiology: Recent Results (from the past 240 hour(s))  Blood Culture (routine x 2)     Status: None (Preliminary result)   Collection Time: 01/31/22 10:30 AM   Specimen: BLOOD  Result Value Ref Range Status   Specimen Description BLOOD BLOOD LEFT FOREARM  Final   Special Requests   Final    BOTTLES DRAWN AEROBIC AND ANAEROBIC Blood Culture results may not be optimal due to an excessive volume of blood received in culture bottles   Culture   Final    NO GROWTH < 24 HOURS Performed at [facility], [address], [place], [place] [address]    Report Status PENDING  Incomplete  Resp panel by RT-PCR (RSV, Flu A&B, Covid) Anterior Nasal Swab     Status: Abnormal   Collection Time: 01/31/22 10:31 AM   Specimen: Anterior Nasal Swab  Result Value Ref Range Status   SARS Coronavirus 2 by RT PCR NEGATIVE NEGATIVE Final    Comment: (NOTE) SARS-CoV-2 target nucleic acids are NOT  DETECTED.  The SARS-CoV-2 RNA is generally detectable in upper respiratory specimens during the acute phase of infection. The lowest concentration of SARS-CoV-2 viral copies this assay can detect is 138 copies/mL. A negative result does not preclude SARS-Cov-2 infection and should not be used as the sole basis for treatment or other patient management decisions. A negative result may occur with  improper specimen collection/handling, submission of specimen other than nasopharyngeal swab, presence of viral mutation(s) within the areas targeted by this assay, and inadequate number of viral copies(<138 copies/mL). A negative result must be combined with clinical observations, patient history, and epidemiological information. The expected result is Negative.  Fact Sheet for Patients:  EntrepreneurPulse.com.au  Fact Sheet for Healthcare Providers:  IncredibleEmployment.be  This test is no t yet approved or cleared by the [place] FDA and  has been authorized for detection and/or diagnosis of SARS-CoV-2 by FDA under an Emergency Use Authorization (EUA). This EUA will remain  in effect (meaning this test can be used) for the duration of the COVID-19 declaration under Section 564(b)(1) of the Act, 21 U.S.C.section 360bbb-3(b)(1), unless the authorization is terminated  or revoked sooner.       Influenza A by PCR POSITIVE (A) NEGATIVE Final   Influenza B by PCR NEGATIVE NEGATIVE Final    Comment: (NOTE) The Xpert Xpress SARS-CoV-2/FLU/RSV plus assay is intended as an aid in the diagnosis of influenza from Nasopharyngeal swab specimens and should not be used as a sole basis for treatment. Nasal washings and aspirates are unacceptable for Xpert Xpress SARS-CoV-2/FLU/RSV testing.  Fact Sheet for Patients: EntrepreneurPulse.com.au  Fact Sheet for Healthcare Providers: IncredibleEmployment.be  This test is not  yet approved or cleared by the [place] FDA and has been authorized for detection and/or diagnosis of SARS-CoV-2 by FDA under an Emergency Use Authorization (EUA). This EUA will remain in effect (meaning this test can be used) for the duration of the COVID-19 declaration under Section 564(b)(1) of the Act, 21 U.S.C. section 360bbb-3(b)(1), unless the authorization is terminated or revoked.     Resp Syncytial Virus by PCR NEGATIVE NEGATIVE Final    Comment: (NOTE) Fact Sheet for Patients: EntrepreneurPulse.com.au  Fact Sheet for Healthcare Providers: IncredibleEmployment.be  This test is not yet approved or cleared by the [place] FDA and has been authorized for detection and/or diagnosis of SARS-CoV-2 by FDA under an Emergency Use Authorization (EUA). This EUA will remain in effect (meaning this test can be used) for the duration of the COVID-19 declaration under Section 564(b)(1) of the Act, 21 U.S.C. section 360bbb-3(b)(1), unless the authorization is terminated or revoked.  Performed at [facility], [address] [place], [place] [address]   Blood Culture (routine x 2)     Status: None (Preliminary result)   Collection Time: 01/31/22 10:31 AM   Specimen: BLOOD  Result Value Ref Range Status   Specimen Description BLOOD  RIGHT HAND  Final   Special Requests   Final    BOTTLES DRAWN AEROBIC AND ANAEROBIC Blood Culture adequate volume   Culture   Final    NO GROWTH < 24 HOURS Performed at [facility], [address] [place],  [address]    Report Status PENDING  Incomplete      Radiology Studies last 3 days: DG Chest Port 1 View  Result Date: 01/31/2022 CLINICAL DATA:  Questionable sepsis, altered mental status, hallucinations EXAM: PORTABLE CHEST 1 VIEW COMPARISON:  Portable exam 1057 hours compared to 11/05/2021 FINDINGS: Enlargement of cardiac  silhouette with pulmonary vascular congestion.  Atherosclerotic calcification aorta. Increased interstitial markings in both lungs greatest in RIGHT upper lobe, could represent minimal asymmetric pulmonary edema or infection. Subsegmental atelectasis LEFT base medially. No pleural effusion or pneumothorax. Osseous structures unremarkable. IMPRESSION: Enlargement of cardiac silhouette with pulmonary vascular congestion. Accentuated interstitial markings especially RIGHT upper lobe, question minimal aseptic pulmonary edema or infection. Aortic Atherosclerosis (ICD10-I70.0). Electronically Signed   By: Lavonia Dana M.D.   On: 01/31/2022 11:06             LOS: 1 day        Emeterio Reeve, DO [facility] 02/01/2022, 2:13 PM    Dictation software may have been used to generate the above note. Typos may occur and escape review in typed/dictated notes. Please contact Dr Sheppard Coil directly for clarity if needed.  Staff may message me via secure chat in [place]  but this may not receive an immediate response,  please page me for urgent matters!  If 7PM-7AM, please contact night coverage [email or web address]

## 2022-02-01 NOTE — ED Notes (Signed)
Transportation requested  

## 2022-02-01 NOTE — Plan of Care (Signed)

## 2022-02-01 NOTE — Plan of Care (Signed)
  Problem: Activity: Goal: Risk for activity intolerance will decrease Outcome: Progressing   Problem: Nutrition: Goal: Adequate nutrition will be maintained Outcome: Progressing   Problem: Pain Managment: Goal: General experience of comfort will improve Outcome: Progressing   Problem: Elimination: Goal: Will not experience complications related to bowel motility Outcome: Progressing   Problem: Safety: Goal: Ability to remain free from injury will improve Outcome: Progressing   Problem: Skin Integrity: Goal: Risk for impaired skin integrity will decrease Outcome: Progressing

## 2022-02-01 NOTE — ED Notes (Signed)
Pt cleaned of bowel movement. Male purewick changed, linens changed. Oxygen increased to 5 liter's [place] from 3 liter's.

## 2022-02-02 DIAGNOSIS — R4182 Altered mental status, unspecified: Secondary | ICD-10-CM | POA: Diagnosis not present

## 2022-02-02 DIAGNOSIS — K219 Gastro-esophageal reflux disease without esophagitis: Secondary | ICD-10-CM | POA: Diagnosis not present

## 2022-02-02 DIAGNOSIS — G20C Parkinsonism, unspecified: Secondary | ICD-10-CM | POA: Diagnosis not present

## 2022-02-02 DIAGNOSIS — R6510 Systemic inflammatory response syndrome (SIRS) of non-infectious origin without acute organ dysfunction: Secondary | ICD-10-CM | POA: Diagnosis not present

## 2022-02-02 MED ORDER — FUROSEMIDE 40 MG PO TABS
40.0000 mg | ORAL_TABLET | Freq: Once | ORAL | Status: AC
  Administered 2022-02-02: 40 mg via ORAL
  Filled 2022-02-02: qty 1

## 2022-02-02 MED ORDER — IPRATROPIUM-ALBUTEROL 0.5-2.5 (3) MG/3ML IN SOLN
3.0000 mL | RESPIRATORY_TRACT | Status: AC
  Administered 2022-02-02 (×4): 3 mL via RESPIRATORY_TRACT
  Filled 2022-02-02 (×3): qty 3

## 2022-02-02 NOTE — Plan of Care (Signed)

## 2022-02-02 NOTE — Evaluation (Signed)
Physical Therapy Evaluation Patient Details Name: Randy [name] MRN: [id number] DOB: 1934/06/05 Today's Date: 02/02/2022  History of Present Illness  presented to ER secondary to AMS, confusion; admitted for management of sepsis related to influenza, possible CAP (R UL)  Clinical Impression  Patient resting in bed with daughter at bedside upon arrival to room. Patient alert and oriented to self only; daughter assists with history and prior level of function information.  At baseline, patient largely WC level as primary mobility; completing SPT between seating surfaces with +1 assist. Currently, patient generally weak and deconditioned; requiring act assist/hand-over-hand assist to initiate and complete all functional movement patterns/activities.  Requires max/total assist for bed mobility, but maintains unsupported sitting balance with close sup once upright and accommodated to midline.  Progressed to attempt sit/stand from elevated bed surface, requiring max assist +1 with constant cuing for foot placement/blocking to maintain position; extensive assist for forward trunk lean/anterior weight shift, lift off and standing balance. Exceptionally strong posterior bias with exertion (tending to slide/move feet forward if not blocked). Clears buttocks on second attempt, but unable to achieve full stand. Recommend +2 for future efforts   Would benefit from skilled PT to address above deficits and promote optimal return to PLOF.; recommend transition to STR upon discharge from acute hospitalization.  If family opts for transition home, would recommend max HH services (PT, OT, RN, aide) to facilitate transition with equipment necessary for total care (have hospital bed, manual WC and RW; will need hoyer lift, BSC.    Recommendations for follow up therapy are one component of a multi-disciplinary discharge planning process, led by the attending physician.  Recommendations may be updated based on patient status,  additional functional criteria and insurance authorization.  Follow Up Recommendations Skilled nursing-short term rehab (<3 hours/day) Can patient physically be transported by private vehicle: No    Assistance Recommended at Discharge Frequent or constant Supervision/Assistance  Patient can return home with the following  Two people to help with walking and/or transfers;Two people to help with bathing/dressing/bathroom    Equipment Recommendations    Recommendations for Other Services       Functional Status Assessment Patient has had a recent decline in their functional status and demonstrates the ability to make significant improvements in function in a reasonable and predictable amount of time.     Precautions / Restrictions Precautions Precautions: Fall Restrictions Weight Bearing Restrictions: No      Mobility  Bed Mobility Overal bed mobility: Needs Assistance Bed Mobility: Supine to Sit, Sit to Supine     Supine to sit: Mod assist, Max assist Sit to supine: Max assist, Total assist   General bed mobility comments: poor dissociation of trunk/extremities with movement transition; hand-over-hand assist to initiate and complete.  Unsupported sitting balance with close sup once upright and accommodated to midline    Transfers Overall transfer level: Needs assistance Equipment used: Rolling walker (2 wheels) Transfers: Sit to/from Stand Sit to Stand: Max assist, From elevated surface           General transfer comment: constant cuing for foot placement/blocking to maintain position; extensive assist for forward trunk lean/anterior weight shift, lift off and standing balance.  Exceptionally strong posterior bias with exertion (tending to slide/move feet forward if not blocked).  Clears buttocks on second attempt, but unable to achieve full stand.  Recommend +2 for future efforts    Ambulation/Gait               General Gait Details:  unsafe/unable; WC level as  primary mobility at baseline  Stairs            Wheelchair Mobility    Modified Rankin (Stroke Patients Only)       Balance Overall balance assessment: Needs assistance Sitting-balance support: No upper extremity supported, Feet supported Sitting balance-Leahy Scale: Fair     Standing balance support: Bilateral upper extremity supported Standing balance-Leahy Scale: Zero                               Pertinent Vitals/Pain Pain Assessment Pain Assessment: Faces Faces Pain Scale: Hurts little more Pain Location: bilat knees, chronic arthritic pain Pain Descriptors / Indicators: Aching, Guarding, Grimacing Pain Intervention(s): Limited activity within patient's tolerance, Monitored during session, Repositioned    Home Living Family/patient expects to be discharged to:: Private residence Living Arrangements: Children;Spouse/significant other Available Help at Discharge: Family;Available 24 hours/day Type of Home: House Home Access: Ramped entrance       Home Layout: One level Home Equipment: Rollator (4 wheels);Rolling Walker (2 wheels);Grab bars - tub/shower;Toilet riser;Shower seat - built in;Hospital bed;Wheelchair - manual      Prior Function               Mobility Comments: Per daughter, WC level as primary mobility, completing SPT between seating surfaces with +1 assist (50% help). Fall history related to "when he attempts to get up by himself" ADLs Comments: Extensive assist for transfer in/out of shower, with some assist for bathing routine..  Pt's wife and daughter provide assist for household management, medications, transportation.     Hand Dominance        Extremity/Trunk Assessment   Upper Extremity Assessment Upper Extremity Assessment: Generalized weakness (grossly 4/5 throughout; significant tremor to R > L UE)    Lower Extremity Assessment Lower Extremity Assessment: Generalized weakness (grossly 4-/5 throughout, generally  resistant to hip/knee flexion in supine; functional knee flexion to approx 75-80 degrees when seated edge of bed)       Communication   Communication: Expressive difficulties (speech generally garbled, periods of clear/logical speech mixed with non-sensical words/conversation at times)  Cognition Arousal/Alertness: Awake/alert Behavior During Therapy: WFL for tasks assessed/performed Overall Cognitive Status: History of cognitive impairments - at baseline                                 General Comments: Oriented to self only; pleasant and cooperative, but limited ability to follow verbal commands without hand-over-hand/act assist for all activities        General Comments      Exercises     Assessment/Plan    PT Assessment Patient needs continued PT services  PT Problem List Decreased range of motion;Decreased activity tolerance;Decreased balance;Decreased mobility;Decreased coordination;Decreased strength;Decreased cognition;Decreased knowledge of use of DME;Decreased safety awareness;Decreased knowledge of precautions       PT Treatment Interventions DME instruction;Functional mobility training;Therapeutic activities;Therapeutic exercise;Balance training;Cognitive remediation;Patient/family education    PT Goals (Current goals can be found in the Care Plan section)  Acute Rehab PT Goals Patient Stated Goal: to be able to stand and pivot with +1 for return home PT Goal Formulation: With patient/family Time For Goal Achievement: 02/16/22 Potential to Achieve Goals: Fair    Frequency Min 2X/week     Co-evaluation               AM-PAC PT "6 Clicks"  Mobility  Outcome Measure Help needed turning from your back to your side while in a flat bed without using bedrails?: A Lot Help needed moving from lying on your back to sitting on the side of a flat bed without using bedrails?: Total Help needed moving to and from a bed to a chair (including a  wheelchair)?: Total Help needed standing up from a chair using your arms (e.g., wheelchair or bedside chair)?: Total Help needed to walk in hospital room?: Total Help needed climbing 3-5 steps with a railing? : Total 6 Click Score: 7    End of Session Equipment Utilized During Treatment: Gait belt;Oxygen Activity Tolerance: Patient tolerated treatment well Patient left: in bed;with bed alarm set;with call bell/phone within reach;with family/visitor present Nurse Communication: Mobility status PT Visit Diagnosis: Muscle weakness (generalized) (M62.81);Difficulty in walking, not elsewhere classified (R26.2)    Time: 3343-5686 PT Time Calculation (min) (ACUTE ONLY): 39 min   Charges:   PT Evaluation $PT Eval Moderate Complexity: 1 Mod PT Treatments $Therapeutic Activity: 8-22 mins        Arasely Akkerman H. Owens Shark, PT, DPT, NCS 02/02/22, 4:25 PM [phone]

## 2022-02-02 NOTE — Progress Notes (Signed)
PROGRESS NOTE    Randy [name]   PJK:[id number] DOB: 15-Dec-1934  DOA: 01/31/2022 Date of Service: 02/02/22 PCP: [name], DO     Brief Narrative / Hospital Course:  [name] is a 88 year old male with history of lewy body dementia, hypertension, depression, insomnia, Parkinson's, who presents emergency room for chief concerns of confusion. Onset 5-6 days ago per family w. AMS/confusion, brief improvement then worsening again and family called EMS. Per ED report, EMS found patient with SpO2 of 88% on room air, patient was placed on 2 L nasal cannula with improvement. 01/25: In ED, febrile 101.1, tachypneic alternating w/ normal RR< BP stable, SpO2 96% 4L [place]. Lactic acid is 0.8.  CXR possible RUL PNA. (+)influenza A by PCR. Blood cultures x 2 in process. In ED received Azithromycin 500 mg IV every 24 hours, 5 days ordered; ceftriaxone 2 g IV every 24 hours, 5 days ordered.  Sodium chloride 1 L bolus.  LR infusion at 150 mL/h. Admitted to hospitalist service. Outside window for antiviral benefit. Procal and BCx pending.  01/26: Procalcitonin <0.10. Still on 4L [place] O2 and tachypneic in AM though RR improving into the afternoon. Confused a bit above baseline per daughter.  01/27: Still confused. On 3-5L [place] O2, slight rales, will trial Lasix po x1 today given hx G1DD    Consultants:  none  Procedures: none      ASSESSMENT & PLAN:   Principal Problem:   SIRS (systemic inflammatory response syndrome) (HCC) Active Problems:   Altered mental status   Parkinsonism   Lewy body dementia without behavioral disturbance (HCC)   Essential hypertension   GERD (gastroesophageal reflux disease)   Influenza A   Grade 1 diastolic dysfunction   SIRS (systemic inflammatory response syndrome) ([place]) Sepsis d/t Influenza A, possible CAP RUL Patient had elevated heart rate and resp rate, Tmax documented in the hospital at 101.1 Today 02/01/22 still w/ high RR but other sepsis  parameters have resolved  Continue with azithromycin 500 mg IV daily, ceftriaxone 2 g IV daily for 5 days - procal negative, may d/c abx if BCx also no growth  Admit to PCU, inpatient - deescalate as able to Med Surg once VS hopefully improve   Influenza A Patient is outside window for antiviral benefits Continue symptomatic support  LR 125 mL/h, 20 hours ordered -caution as he does have Echo w/ G1DD documented 06/2020 - monitor fluid status closely  Acetaminophen 650 mg p.o. every 6 hours as needed for mild pain fever, acetaminophen 650 rectal, every 6 hours.  For mild pain and fever. Acetaminophen 1000 mg IV every 6 hours as needed for muscle aches not relieved with p.o. acetaminophen, 1 day ordered  Grade 1 diastolic dysfunction Slight rales on exam today Lasix po x1  Monitor fluid status and O2 requirement   Essential hypertension Enalapril 5 mg daily resumed for 02/01/2022 Hydralazine 5 mg IV every 8 hours as needed for SBP greater than 180, 4 days ordered  GERD (gastroesophageal reflux disease) PPI  Lewy body dementia without behavioral disturbance (HCC) Parkinsonism Rivastigmine 3 mg p.o. 3 times daily Carbidopa-levodopa 50-200 mg p.o. 4 times daily    DVT prophylaxis: lovenox  Pertinent IV fluids/nutrition: LR 125 mL/h - caution as he does have Echo w/ G1DD documented 06/2020 Central lines / invasive devices: none  Code Status: FULL CODE confirmed over the phone w/ daughter 02/01/22 2:13 PM by Dr Sheppard Coil   Current Admission Status: inpatient, PCU  [facility] needs /  Dispo plan: none at this time, PT/OT to eval once more stable - per daughter, goal is to get him home, as long as he can stand and maneuver in and out of a wheelchair she ahdhis aide will be able to care for him.  Barriers to discharge / significant pending items: O2 requirement, IV fluids, await blood cultures and clinical improvement, anticipate discharge in a few days if improves as expected               Subjective / Brief ROS:  Unable to assess pt d/t dementia - speech is clear but he is incoherent. No distress.   Family Communication: none at this time.     Objective Findings:  Vitals:   02/02/22 0832 02/02/22 0919 02/02/22 1055 02/02/22 1139  BP: (!) 156/80  (!) 153/97   Pulse: 63     Resp: 19     Temp: 98.3 F (36.8 C)     TempSrc: Oral     SpO2: 93% 91%  92%  Weight:      Height:        Intake/Output Summary (Last 24 hours) at 02/02/2022 1221 Last data filed at 02/02/2022 0944 Gross per 24 hour  Intake 120 ml  Output 1200 ml  Net -1080 ml    Filed Weights   01/31/22 1024 02/01/22 1542  Weight: 96.8 kg 95.5 kg    Examination:  Physical Exam Constitutional:      General: He is not in acute distress. Cardiovascular:     Rate and Rhythm: Normal rate and regular rhythm.     Heart sounds: Murmur heard.  Pulmonary:     Effort: Pulmonary effort is normal. No respiratory distress.     Breath sounds: Wheezing (minimal) and rales (at bases) present.  Abdominal:     General: Abdomen is flat.     Palpations: Abdomen is soft.  Musculoskeletal:        General: No swelling or deformity.     Cervical back: Normal range of motion.  Skin:    General: Skin is warm and dry.  Neurological:     Mental Status: He is alert. He is disoriented.     Cranial Nerves: No cranial nerve deficit.     Coordination: Coordination normal.  Psychiatric:        Mood and Affect: Mood normal.          Scheduled Medications:   ascorbic acid  500 mg Oral Daily   carbidopa-levodopa  1 tablet Oral QID   cholecalciferol  1,000 Units Oral Daily   citric acid-potassium citrate  20 mEq Oral QHS   enalapril  5 mg Oral Daily   enoxaparin (LOVENOX) injection  40 mg Subcutaneous Q24H   furosemide  40 mg Oral Once   ipratropium-albuterol  3 mL Nebulization Q4H   multivitamin with minerals  1 tablet Oral Daily   pantoprazole  40 mg Oral Daily   rivastigmine  3 mg  Oral BID   sertraline  50 mg Oral Daily    Continuous Infusions:    PRN Medications:  acetaminophen **OR** acetaminophen, ALPRAZolam, chlorpheniramine-HYDROcodone, diclofenac Sodium, guaiFENesin, haloperidol lactate, hydrALAZINE, HYDROcodone-acetaminophen, melatonin, ondansetron **OR** ondansetron (ZOFRAN) IV, senna-docusate  Antimicrobials from admission:  Anti-infectives (From admission, onward)    Start     Dose/Rate Route Frequency Ordered Stop   01/31/22 1030  cefTRIAXone (ROCEPHIN) 2 g in sodium chloride 0.9 % 100 mL IVPB  Status:  Discontinued        2 g  200 mL/hr over 30 Minutes Intravenous Every 24 hours 01/31/22 1028 01/31/22 1747   01/31/22 1030  azithromycin (ZITHROMAX) 500 mg in sodium chloride 0.9 % 250 mL IVPB  Status:  Discontinued        500 mg 250 mL/hr over 60 Minutes Intravenous Every 24 hours 01/31/22 1028 01/31/22 1747           Data Reviewed:  I have personally reviewed the following...  CBC: Recent Labs  Lab 01/31/22 1031 02/01/22 0549  WBC 4.2 5.1  NEUTROABS 3.7  --   HGB 11.2* 10.7*  HCT 34.1* 33.8*  MCV 90.7 94.4  PLT 156 131*    Basic Metabolic Panel: Recent Labs  Lab 01/31/22 1031 02/01/22 0549  NA 136 136  K 3.4* 3.5  CL 103 103  CO2 27 23  GLUCOSE 119* 87  BUN 20 22  CREATININE 0.79 0.76  CALCIUM 8.3* 7.8*  MG  --  1.8    GFR: Estimated Creatinine Clearance: 79.5 mL/min (by C-G formula based on SCr of 0.76 mg/dL). Liver Function Tests: Recent Labs  Lab 01/31/22 1031  AST 17  ALT 6  ALKPHOS 72  BILITOT 1.3*  PROT 6.7  ALBUMIN 3.4*    No results for input(s): "LIPASE", "AMYLASE" in the last 168 hours. No results for input(s): "AMMONIA" in the last 168 hours. Coagulation Profile: Recent Labs  Lab 01/31/22 1031  INR 1.1    Cardiac Enzymes: No results for input(s): "CKTOTAL", "CKMB", "CKMBINDEX", "TROPONINI" in the last 168 hours. BNP (last 3 results) No results for input(s): "PROBNP" in the last 8760  hours. HbA1C: No results for input(s): "HGBA1C" in the last 72 hours. CBG: No results for input(s): "GLUCAP" in the last 168 hours. Lipid Profile: No results for input(s): "CHOL", "HDL", "LDLCALC", "TRIG", "CHOLHDL", "LDLDIRECT" in the last 72 hours. Thyroid Function Tests: No results for input(s): "TSH", "T4TOTAL", "FREET4", "T3FREE", "THYROIDAB" in the last 72 hours. Anemia Panel: No results for input(s): "VITAMINB12", "FOLATE", "FERRITIN", "TIBC", "IRON", "RETICCTPCT" in the last 72 hours. Most Recent Urinalysis On File:     Component Value Date/Time   COLORURINE YELLOW (A) 01/31/2022 1446   APPEARANCEUR CLEAR (A) 01/31/2022 1446   APPEARANCEUR Cloudy (A) 08/17/2020 1024   LABSPEC 1.018 01/31/2022 1446   PHURINE 5.0 01/31/2022 1446   GLUCOSEU NEGATIVE 01/31/2022 1446   HGBUR NEGATIVE 01/31/2022 1446   BILIRUBINUR NEGATIVE 01/31/2022 1446   BILIRUBINUR Negative 08/17/2020 1024   BILIRUBINUR negative 04/17/2015 0826   KETONESUR 5 (A) 01/31/2022 1446   PROTEINUR NEGATIVE 01/31/2022 1446   UROBILINOGEN 0.2 08/09/2020 1319   UROBILINOGEN Normal 04/17/2015 0826   NITRITE NEGATIVE 01/31/2022 1446   LEUKOCYTESUR NEGATIVE 01/31/2022 1446   LEUKOCYTESUR negative 04/17/2015 0826   Sepsis Labs: '@LABRCNTIP'$ (procalcitonin:4,lacticidven:4) Microbiology: Recent Results (from the past 240 hour(s))  Blood Culture (routine x 2)     Status: None (Preliminary result)   Collection Time: 01/31/22 10:30 AM   Specimen: BLOOD  Result Value Ref Range Status   Specimen Description BLOOD BLOOD LEFT FOREARM  Final   Special Requests   Final    BOTTLES DRAWN AEROBIC AND ANAEROBIC Blood Culture results may not be optimal due to an excessive volume of blood received in culture bottles   Culture   Final    NO GROWTH 2 DAYS Performed at [facility], [address] [place], [place] [address]    Report Status PENDING  Incomplete  Resp panel by RT-PCR (RSV, Flu A&B, Covid) Anterior Nasal Swab  Status: Abnormal   Collection Time: 01/31/22 10:31 AM   Specimen: Anterior Nasal Swab  Result Value Ref Range Status   SARS Coronavirus 2 by RT PCR NEGATIVE NEGATIVE Final    Comment: (NOTE) SARS-CoV-2 target nucleic acids are NOT DETECTED.  The SARS-CoV-2 RNA is generally detectable in upper respiratory specimens during the acute phase of infection. The lowest concentration of SARS-CoV-2 viral copies this assay can detect is 138 copies/mL. A negative result does not preclude SARS-Cov-2 infection and should not be used as the sole basis for treatment or other patient management decisions. A negative result may occur with  improper specimen collection/handling, submission of specimen other than nasopharyngeal swab, presence of viral mutation(s) within the areas targeted by this assay, and inadequate number of viral copies(<138 copies/mL). A negative result must be combined with clinical observations, patient history, and epidemiological information. The expected result is Negative.  Fact Sheet for Patients:  EntrepreneurPulse.com.au  Fact Sheet for Healthcare Providers:  IncredibleEmployment.be  This test is no t yet approved or cleared by the [place] FDA and  has been authorized for detection and/or diagnosis of SARS-CoV-2 by FDA under an Emergency Use Authorization (EUA). This EUA will remain  in effect (meaning this test can be used) for the duration of the COVID-19 declaration under Section 564(b)(1) of the Act, 21 U.S.C.section 360bbb-3(b)(1), unless the authorization is terminated  or revoked sooner.       Influenza A by PCR POSITIVE (A) NEGATIVE Final   Influenza B by PCR NEGATIVE NEGATIVE Final    Comment: (NOTE) The Xpert Xpress SARS-CoV-2/FLU/RSV plus assay is intended as an aid in the diagnosis of influenza from Nasopharyngeal swab specimens and should not be used as a sole basis for treatment. Nasal washings  and aspirates are unacceptable for Xpert Xpress SARS-CoV-2/FLU/RSV testing.  Fact Sheet for Patients: EntrepreneurPulse.com.au  Fact Sheet for Healthcare Providers: IncredibleEmployment.be  This test is not yet approved or cleared by the [place] FDA and has been authorized for detection and/or diagnosis of SARS-CoV-2 by FDA under an Emergency Use Authorization (EUA). This EUA will remain in effect (meaning this test can be used) for the duration of the COVID-19 declaration under Section 564(b)(1) of the Act, 21 U.S.C. section 360bbb-3(b)(1), unless the authorization is terminated or revoked.     Resp Syncytial Virus by PCR NEGATIVE NEGATIVE Final    Comment: (NOTE) Fact Sheet for Patients: EntrepreneurPulse.com.au  Fact Sheet for Healthcare Providers: IncredibleEmployment.be  This test is not yet approved or cleared by the [place] FDA and has been authorized for detection and/or diagnosis of SARS-CoV-2 by FDA under an Emergency Use Authorization (EUA). This EUA will remain in effect (meaning this test can be used) for the duration of the COVID-19 declaration under Section 564(b)(1) of the Act, 21 U.S.C. section 360bbb-3(b)(1), unless the authorization is terminated or revoked.  Performed at [facility], [address] [place], [place] [address]   Blood Culture (routine x 2)     Status: None (Preliminary result)   Collection Time: 01/31/22 10:31 AM   Specimen: BLOOD  Result Value Ref Range Status   Specimen Description BLOOD  RIGHT HAND  Final   Special Requests   Final    BOTTLES DRAWN AEROBIC AND ANAEROBIC Blood Culture adequate volume   Culture   Final    NO GROWTH 2 DAYS Performed at [facility], [address] [place], [place] [address]    Report Status PENDING  Incomplete  Radiology Studies last 3 days: DG Chest Port 1 View  Result Date:  01/31/2022 CLINICAL DATA:  Questionable sepsis, altered mental status, hallucinations EXAM: PORTABLE CHEST 1 VIEW COMPARISON:  Portable exam 1057 hours compared to 11/05/2021 FINDINGS: Enlargement of cardiac silhouette with pulmonary vascular congestion. Atherosclerotic calcification aorta. Increased interstitial markings in both lungs greatest in RIGHT upper lobe, could represent minimal asymmetric pulmonary edema or infection. Subsegmental atelectasis LEFT base medially. No pleural effusion or pneumothorax. Osseous structures unremarkable. IMPRESSION: Enlargement of cardiac silhouette with pulmonary vascular congestion. Accentuated interstitial markings especially RIGHT upper lobe, question minimal aseptic pulmonary edema or infection. Aortic Atherosclerosis (ICD10-I70.0). Electronically Signed   By: Lavonia [place] M.D.   On: 01/31/2022 11:06             LOS: 2 days        Emeterio Reeve, DO [facility] 02/02/2022, 12:21 PM    Dictation software may have been used to generate the above note. Typos may occur and escape review in typed/dictated notes. Please contact Dr Sheppard Coil directly for clarity if needed.  Staff may message me via secure chat in [place]  but this may not receive an immediate response,  please page me for urgent matters!  If 7PM-7AM, please contact night coverage [email or web address]

## 2022-02-03 DIAGNOSIS — G20C Parkinsonism, unspecified: Secondary | ICD-10-CM | POA: Diagnosis not present

## 2022-02-03 DIAGNOSIS — R4182 Altered mental status, unspecified: Secondary | ICD-10-CM | POA: Diagnosis not present

## 2022-02-03 DIAGNOSIS — R6510 Systemic inflammatory response syndrome (SIRS) of non-infectious origin without acute organ dysfunction: Secondary | ICD-10-CM | POA: Diagnosis not present

## 2022-02-03 DIAGNOSIS — K219 Gastro-esophageal reflux disease without esophagitis: Secondary | ICD-10-CM | POA: Diagnosis not present

## 2022-02-03 LAB — BASIC METABOLIC PANEL
Anion gap: 7 (ref 5–15)
BUN: 17 mg/dL (ref 8–23)
CO2: 29 mmol/L (ref 22–32)
Calcium: 7.6 mg/dL — ABNORMAL LOW (ref 8.9–10.3)
Chloride: 96 mmol/L — ABNORMAL LOW (ref 98–111)
Creatinine, Ser: 0.76 mg/dL (ref 0.61–1.24)
GFR, Estimated: >60 mL/min (ref >60)
Glucose, Bld: 107 mg/dL — ABNORMAL HIGH (ref 70–99)
Potassium: 3.1 mmol/L — ABNORMAL LOW (ref 3.5–5.1)
Sodium: 132 mmol/L — ABNORMAL LOW (ref 135–145)

## 2022-02-03 MED ORDER — POTASSIUM CHLORIDE CRYS ER 20 MEQ PO TBCR
40.0000 meq | EXTENDED_RELEASE_TABLET | Freq: Once | ORAL | Status: AC
  Administered 2022-02-03: 40 meq via ORAL
  Filled 2022-02-03: qty 2

## 2022-02-03 NOTE — NC FL2 (Signed)
[place] LEVEL OF CARE FORM     IDENTIFICATION  Patient Name: Randy [name] Birthdate: Jul 07, 1934 Sex: male Admission Date (Current Location): 01/31/2022  [facility] and [place] Number:  Engineering geologist and Address:  [facility], [address] [place], [place] [address]      Provider Number: [id number]  Attending Physician Name and Address:  Emeterio Reeve, DO  Relative Name and Phone Number:  [facility]) Hall,[name] (Daughter) [phone] (Home Phone)    Current Level of Care: Hospital Recommended Level of Care: [place] Prior Approval Number:    Date Approved/Denied:   PASRR Number: [id number]  Discharge Plan:      Current Diagnoses: Patient Active Problem List   Diagnosis Date Noted   Influenza 02/01/2022   SIRS (systemic inflammatory response syndrome) ([place]) 01/31/2022   Influenza A 01/31/2022   Dysphagia    Hiatal hernia without gangrene or obstruction    Ambulatory dysfunction 07/28/2021   Hypertensive urgency 07/28/2021   History of prostate cancer 07/28/2021   Parkinsonism 07/28/2021   Altered mental status 07/28/2021   Swelling of limb 12/26/2020   Bilateral primary osteoarthritis of knee 02/15/2020   Pressure injury of buttock, stage 1    Delirium    COVID-19 virus infection 10/08/2019   Unable to care for self 10/08/2019   Caregiver unable to cope 10/08/2019   Recurrent falls 10/08/2019   Lewy body dementia without behavioral disturbance ([place])    Prostate cancer ([place]) 06/30/2019   BPH with urinary obstruction 11/06/2018   Acute cholecystitis 09/08/2018   Myalgia due to statin 02/24/2018   Tricompartment osteoarthritis of left knee 11/14/2017   Generalized weakness 04/28/2017   Chronic low back pain 03/12/2017   Knee pain, chronic 10/01/2016   Chronic venous insufficiency 08/30/2016   BPH without obstruction/lower urinary tract symptoms 03/06/2016   Osteoarthritis of multiple joints  03/06/2016   Degenerative joint disease (DJD) of lumbar spine 03/06/2016   Essential hypertension 03/05/2016   Hyperlipidemia 03/05/2016   Osteoporosis 03/05/2016   GERD (gastroesophageal reflux disease) 03/05/2016   Bilateral lower extremity edema 03/05/2016   Dupuytren's disease of palm 05/28/2015    Orientation RESPIRATION BLADDER Height & Weight     Self, Place  O2 Incontinent, External catheter Weight: 210 lb 8.6 oz (95.5 kg) Height:  6' (182.9 cm)  BEHAVIORAL SYMPTOMS/MOOD NEUROLOGICAL BOWEL NUTRITION STATUS      Incontinent Diet (heart)  AMBULATORY STATUS COMMUNICATION OF NEEDS Skin   Extensive Assist Verbally Bruising                       Personal Care Assistance Level of Assistance  Bathing, Feeding, Dressing Bathing Assistance: Maximum assistance Feeding assistance: Maximum assistance Dressing Assistance: Maximum assistance     Functional Limitations Info             SPECIAL CARE FACTORS FREQUENCY  PT (By licensed PT), OT (By licensed OT)     PT Frequency: 5 times per week OT Frequency: 5 times per week            Contractures      Additional Factors Info  Code Status, Allergies Code Status Info: full Allergies Info: Antazoline, Antihistamines, Chlorpheniramine-type           Current Medications (02/03/2022):  This is the current hospital active medication list Current Facility-Administered Medications  Medication Dose Route Frequency Provider Last Rate Last Admin   acetaminophen (TYLENOL) tablet 650 mg  650 mg Oral  Q6H PRN Cox, Amy N, DO   650 mg at 02/01/22 1151   Or   acetaminophen (TYLENOL) suppository 650 mg  650 mg Rectal Q6H PRN Cox, Amy N, DO       ALPRAZolam Duanne Moron) tablet 0.5 mg  0.5 mg Oral BID PRN Cox, Amy N, DO   0.5 mg at 02/02/22 1405   ascorbic acid (VITAMIN C) tablet 500 mg  500 mg Oral Daily Cox, Amy N, DO   500 mg at 02/03/22 1001   carbidopa-levodopa (SINEMET CR) 50-200 MG per tablet controlled release 1 tablet  1  tablet Oral QID Cox, Amy N, DO   1 tablet at 02/03/22 1000   chlorpheniramine-HYDROcodone (TUSSIONEX) 10-8 MG/5ML suspension 5 mL  5 mL Oral QHS PRN Cox, Amy N, DO       cholecalciferol (VITAMIN D3) 25 MCG (1000 UNIT) tablet 1,000 Units  1,000 Units Oral Daily Cox, Amy N, DO   1,000 Units at 02/03/22 1000   citric acid-potassium citrate (POLYCITRA) 10 mEq/5 ml solution 20 mEq  20 mEq Oral QHS Lorin Picket, RPH   20 mEq at 02/02/22 2110   diclofenac Sodium (VOLTAREN) 1 % topical gel 2 g  2 g Topical QID PRN Cox, Amy N, DO       enalapril (VASOTEC) tablet 5 mg  5 mg Oral Daily Cox, Amy N, DO   5 mg at 02/03/22 1000   enoxaparin (LOVENOX) injection 40 mg  40 mg Subcutaneous Q24H Cox, Amy N, DO   40 mg at 02/02/22 2100   guaiFENesin (MUCINEX) 12 hr tablet 600 mg  600 mg Oral BID PRN Cox, Amy N, DO       haloperidol lactate (HALDOL) injection 2 mg  2 mg Intravenous Q6H PRN Cox, Amy N, DO       hydrALAZINE (APRESOLINE) injection 5 mg  5 mg Intravenous Q8H PRN Cox, Amy N, DO       HYDROcodone-acetaminophen (NORCO/VICODIN) 5-325 MG per tablet 1 tablet  1 tablet Oral Q6H PRN Cox, Amy N, DO       melatonin tablet 10 mg  10 mg Oral QHS PRN Cox, Amy N, DO   10 mg at 02/02/22 2104   multivitamin with minerals tablet 1 tablet  1 tablet Oral Daily Cox, Amy N, DO   1 tablet at 02/03/22 1000   ondansetron (ZOFRAN) tablet 4 mg  4 mg Oral Q6H PRN Cox, Amy N, DO       Or   ondansetron (ZOFRAN) injection 4 mg  4 mg Intravenous Q6H PRN Cox, Amy N, DO       pantoprazole (PROTONIX) EC tablet 40 mg  40 mg Oral Daily Cox, Amy N, DO   40 mg at 02/03/22 1000   rivastigmine (EXELON) capsule 3 mg  3 mg Oral BID Cox, Amy N, DO   3 mg at 02/03/22 1000   senna-docusate (Senokot-S) tablet 1 tablet  1 tablet Oral QHS PRN Cox, Amy N, DO       sertraline (ZOLOFT) tablet 50 mg  50 mg Oral Daily Cox, Amy N, DO   50 mg at 02/03/22 1000     Discharge Medications: Please see discharge summary for a list of discharge  medications.  Relevant Imaging Results:  Relevant Lab Results:   Additional Information SS #: [id number]  [place], LCSW

## 2022-02-03 NOTE — TOC Initial Note (Signed)
Transition of Care [facility]) - Initial/Assessment Note    Patient Details  Name: Randy [name] MRN: [id number] Date of Birth: May 31, 1934  Transition of Care [facility]) CM/SW Contact:    Magnus Ivan, LCSW Phone Number: 02/03/2022, 12:18 PM  Clinical Narrative:                 CSW called to speak with daughter [name] regarding PT rec for SNF. Patient lives with daughter and wife who provide care and transport. PCP is Dr. Parks Ranger. [facility] [place]. Patient has a rollator, walker, toilet riser, shower seat, hospital bed, wheelchair, and ramp at home.  [place] has had [place] in the past. Patient went to [facility] in the past, [name] states they do not want patient to return there. [name] is agreeable to SNF workup for other local SNFs. SNF work up started.    Expected Discharge Plan: Skilled Nursing Facility Barriers to Discharge: Continued Medical Work up   Patient Goals and CMS Choice Patient states their goals for this hospitalization and ongoing recovery are:: SNF CMS Medicare.gov Compare Post Acute Care list provided to:: Patient Represenative (must comment) Choice offered to / list presented to : Adult Children      Expected Discharge Plan and Services       Living arrangements for the past 2 months: Single Family Home                                      Prior Living Arrangements/Services Living arrangements for the past 2 months: Single Family Home Lives with:: Adult Children, Spouse Patient language and need for interpreter reviewed:: Yes Do you feel safe going back to the place where you live?: Yes      Need for Family Participation in Patient Care: Yes (Comment) Care giver support system in place?: Yes (comment) Current home services: DME Criminal Activity/Legal Involvement Pertinent to Current Situation/Hospitalization: No - Comment as needed  Activities of Daily Living      Permission Sought/Granted Permission sought to share  information with : Facility Art therapist granted to share information with : Yes, Verbal Permission Granted (by daughter [name])     Permission granted to share info w AGENCY: SNFs        Emotional Assessment       Orientation: : Oriented to Self, Oriented to Situation, Oriented to Place, Oriented to  Time Alcohol / Substance Use: Not Applicable Psych Involvement: No (comment)  Admission diagnosis:  SIRS (systemic inflammatory response syndrome) (HCC) [R65.10] Acute respiratory failure with hypoxia (HCC) [J96.01] Influenza [J11.1] Altered mental status, unspecified altered mental status type [R41.82] Upper respiratory tract infection due to influenza A virus [J10.1] Patient Active Problem List   Diagnosis Date Noted   Influenza 02/01/2022   SIRS (systemic inflammatory response syndrome) ([place]) 01/31/2022   Influenza A 01/31/2022   Dysphagia    Hiatal hernia without gangrene or obstruction    Ambulatory dysfunction 07/28/2021   Hypertensive urgency 07/28/2021   History of prostate cancer 07/28/2021   Parkinsonism 07/28/2021   Altered mental status 07/28/2021   Swelling of limb 12/26/2020   Bilateral primary osteoarthritis of knee 02/15/2020   Pressure injury of buttock, stage 1    Delirium    COVID-19 virus infection 10/08/2019   Unable to care for self 10/08/2019   Caregiver unable to cope 10/08/2019   Recurrent falls 10/08/2019   Lewy body  dementia without behavioral disturbance ([place])    Prostate cancer ([place]) 06/30/2019   BPH with urinary obstruction 11/06/2018   Acute cholecystitis 09/08/2018   Myalgia due to statin 02/24/2018   Tricompartment osteoarthritis of left knee 11/14/2017   Generalized weakness 04/28/2017   Chronic low back pain 03/12/2017   Knee pain, chronic 10/01/2016   Chronic venous insufficiency 08/30/2016   BPH without obstruction/lower urinary tract symptoms 03/06/2016   Osteoarthritis of multiple joints 03/06/2016    Degenerative joint disease (DJD) of lumbar spine 03/06/2016   Essential hypertension 03/05/2016   Hyperlipidemia 03/05/2016   Osteoporosis 03/05/2016   GERD (gastroesophageal reflux disease) 03/05/2016   Bilateral lower extremity edema 03/05/2016   Dupuytren's disease of palm 05/28/2015   PCP:  [name], DO Pharmacy:   [facility] [place] [address] Phone: [phone] Fax: [phone]  [id number] [place] [address] [id number]: [phone]: [phone]    Social Determinants of Health (SDOH) Social History: SDOH Screenings   Food Insecurity: No Food Insecurity (10/05/2021)  Housing: Low Risk  (10/05/2021)  Transportation Needs: No Transportation Needs (10/05/2021)  Utilities: Not At Risk (10/05/2021)  Alcohol Screen: Low Risk  (10/05/2021)  Depression (PHQ2-9): Low Risk  (10/05/2021)  Financial Resource Strain: Low Risk  (10/05/2021)  Physical Activity: Inactive (10/05/2021)  Social Connections: Moderately Isolated (10/05/2021)  Stress: No Stress Concern Present (10/05/2021)  Tobacco Use: Medium Risk (01/31/2022)   SDOH Interventions:     Readmission Risk Interventions    10/11/2019    9:10 AM  Readmission Risk Prevention Plan  Transportation Screening Complete  PCP or Specialist Appt within 3-5 Days Complete  HRI or [place]ers Rest[place]  Social Work Consult for [place]/Counseling Complete  Palliative Care Screening Not Applicable  Medication Review (Press photographer Complete

## 2022-02-03 NOTE — Progress Notes (Signed)
PROGRESS NOTE    Randy [name]   ZDG:[id number] DOB: August 30, 1934  DOA: 01/31/2022 Date of Service: 02/03/22 PCP: [name], DO     Brief Narrative / Hospital Course:  Randy [name] is a 88 year old male with history of lewy body dementia, hypertension, depression, insomnia, Parkinson's, who presents emergency room for chief concerns of confusion. Onset 5-6 days ago per family w. AMS/confusion, brief improvement then worsening again and family called EMS. Per ED report, EMS found patient with SpO2 of 88% on room air, patient was placed on 2 L nasal cannula with improvement. 01/25: In ED, febrile 101.1, tachypneic alternating w/ normal RR< BP stable, SpO2 96% 4L [place]. Lactic acid is 0.8.  CXR possible RUL PNA. (+)influenza A by PCR. Blood cultures x 2 in process. In ED received Azithromycin 500 mg IV every 24 hours, 5 days ordered; ceftriaxone 2 g IV every 24 hours, 5 days ordered.  Sodium chloride 1 L bolus.  LR infusion at 150 mL/h. Admitted to hospitalist service. Outside window for antiviral benefit. Procal and BCx pending.  01/26: Procalcitonin <0.10. Still on 4L [place] O2 and tachypneic in AM though RR improving into the afternoon. Confused a bit above baseline per daughter.  01/27: Still confused. On 3-5L  O2, slight rales, will trial Lasix po x1 today given hx G1DD 01/28: rales improved, still on 3L O2, low K replaced, low Na w/ poor po intake will hold off on restarting fluids     Consultants:  none  Procedures: none      ASSESSMENT & PLAN:   Principal Problem:   SIRS (systemic inflammatory response syndrome) (HCC) Active Problems:   Altered mental status   Parkinsonism   Lewy body dementia without behavioral disturbance (HCC)   Essential hypertension   GERD (gastroesophageal reflux disease)   Influenza A   Grade 1 diastolic dysfunction   SIRS (systemic inflammatory response syndrome) (HCC) Sepsis d/t Influenza A, possible CAP RUL Patient had elevated  heart rate and resp rate, Tmax documented in the hospital at 101.1 azithromycin 500 mg IV daily, ceftriaxone 2 g IV daily for 5 days  Influenza A Patient is outside window for antiviral benefits Continue symptomatic support  LR 125 mL/h, 20 hours ordered -caution as he does have Echo w/ G1DD documented 06/2020 - monitor fluid status closely  Acetaminophen 650 mg p.o. every 6 hours as needed for mild pain fever, acetaminophen 650 rectal, every 6 hours.  For mild pain and fever. Acetaminophen 1000 mg IV every 6 hours as needed for muscle aches not relieved with p.o. acetaminophen, 1 day ordered  Grade 1 diastolic dysfunction Slight rales on exam yesterday improved w/ Lasix po x1  Monitor fluid status and O2 requirement   Essential hypertension Enalapril 5 mg daily resumed for 02/01/2022 Hydralazine 5 mg IV every 8 hours as needed for SBP greater than 180, 4 days ordered  GERD (gastroesophageal reflux disease) PPI  Lewy body dementia without behavioral disturbance (HCC) Parkinsonism Rivastigmine 3 mg p.o. 3 times daily Carbidopa-levodopa 50-200 mg p.o. 4 times daily    DVT prophylaxis: lovenox  Pertinent IV fluids/nutrition: no continuous IV fluids  Central lines / invasive devices: none  Code Status: FULL CODE confirmed over the phone w/ daughter 02/01/22 2:13 PM by Dr Sheppard Coil   Current Admission Status: inpatient, PCU  [facility] needs / Dispo plan: discussing SNF w/ daughter  Barriers to discharge / significant pending items: O2 requirement, SNF placement (today is Sunday)  Subjective / Brief ROS:  Unable to assess fully d/t dementia - speech is clear but he is fairly incoherent. No distress. He says he's hungry and we haven't fed him in a week.   Family Communication: none at this time.     Objective Findings:  Vitals:   02/02/22 2337 02/03/22 0847 02/03/22 1000 02/03/22 1515  BP: 127/83 (!) 148/81 (!) 153/79 (!) 148/76  Pulse: 75 66  73  Resp: '18 16   16  '$ Temp: 99.3 F (37.4 C) 97.8 F (36.6 C)    TempSrc:      SpO2: 91% 94%  91%  Weight:      Height:        Intake/Output Summary (Last 24 hours) at 02/03/2022 1622 Last data filed at 02/03/2022 1516 Gross per 24 hour  Intake 120 ml  Output 2500 ml  Net -2380 ml    Filed Weights   01/31/22 1024 02/01/22 1542  Weight: 96.8 kg 95.5 kg    Examination:  Physical Exam Constitutional:      General: He is not in acute distress. Cardiovascular:     Rate and Rhythm: Normal rate and regular rhythm.     Heart sounds: Murmur heard.  Pulmonary:     Effort: Pulmonary effort is normal. No respiratory distress.     Breath sounds: No wheezing (minimal) or rales (at bases).  Abdominal:     General: Abdomen is flat.     Palpations: Abdomen is soft.  Musculoskeletal:        General: No swelling or deformity.     Cervical back: Normal range of motion.  Skin:    General: Skin is warm and dry.  Neurological:     Mental Status: He is alert. He is disoriented.     Cranial Nerves: No cranial nerve deficit.     Coordination: Coordination normal.  Psychiatric:        Mood and Affect: Mood normal.          Scheduled Medications:   ascorbic acid  500 mg Oral Daily   carbidopa-levodopa  1 tablet Oral QID   cholecalciferol  1,000 Units Oral Daily   citric acid-potassium citrate  20 mEq Oral QHS   enalapril  5 mg Oral Daily   enoxaparin (LOVENOX) injection  40 mg Subcutaneous Q24H   multivitamin with minerals  1 tablet Oral Daily   pantoprazole  40 mg Oral Daily   potassium chloride  40 mEq Oral Once   rivastigmine  3 mg Oral BID   sertraline  50 mg Oral Daily    Continuous Infusions:    PRN Medications:  acetaminophen **OR** acetaminophen, ALPRAZolam, chlorpheniramine-HYDROcodone, diclofenac Sodium, guaiFENesin, haloperidol lactate, hydrALAZINE, HYDROcodone-acetaminophen, melatonin, ondansetron **OR** ondansetron (ZOFRAN) IV, senna-docusate  Antimicrobials from admission:   Anti-infectives (From admission, onward)    Start     Dose/Rate Route Frequency Ordered Stop   01/31/22 1030  cefTRIAXone (ROCEPHIN) 2 g in sodium chloride 0.9 % 100 mL IVPB  Status:  Discontinued        2 g 200 mL/hr over 30 Minutes Intravenous Every 24 hours 01/31/22 1028 01/31/22 1747   01/31/22 1030  azithromycin (ZITHROMAX) 500 mg in sodium chloride 0.9 % 250 mL IVPB  Status:  Discontinued        500 mg 250 mL/hr over 60 Minutes Intravenous Every 24 hours 01/31/22 1028 01/31/22 1747           Data Reviewed:  I have personally reviewed the following.Marland KitchenMarland Kitchen  CBC: Recent Labs  Lab 01/31/22 1031 02/01/22 0549  WBC 4.2 5.1  NEUTROABS 3.7  --   HGB 11.2* 10.7*  HCT 34.1* 33.8*  MCV 90.7 94.4  PLT 156 131*    Basic Metabolic Panel: Recent Labs  Lab 01/31/22 1031 02/01/22 0549 02/03/22 0418  NA 136 136 132*  K 3.4* 3.5 3.1*  CL 103 103 96*  CO2 '27 23 29  '$ GLUCOSE 119* 87 107*  BUN '20 22 17  '$ CREATININE 0.79 0.76 0.76  CALCIUM 8.3* 7.8* 7.6*  MG  --  1.8  --     GFR: Estimated Creatinine Clearance: 79.5 mL/min (by C-G formula based on SCr of 0.76 mg/dL). Liver Function Tests: Recent Labs  Lab 01/31/22 1031  AST 17  ALT 6  ALKPHOS 72  BILITOT 1.3*  PROT 6.7  ALBUMIN 3.4*    No results for input(s): "LIPASE", "AMYLASE" in the last 168 hours. No results for input(s): "AMMONIA" in the last 168 hours. Coagulation Profile: Recent Labs  Lab 01/31/22 1031  INR 1.1    Cardiac Enzymes: No results for input(s): "CKTOTAL", "CKMB", "CKMBINDEX", "TROPONINI" in the last 168 hours. BNP (last 3 results) No results for input(s): "PROBNP" in the last 8760 hours. HbA1C: No results for input(s): "HGBA1C" in the last 72 hours. CBG: No results for input(s): "GLUCAP" in the last 168 hours. Lipid Profile: No results for input(s): "CHOL", "HDL", "LDLCALC", "TRIG", "CHOLHDL", "LDLDIRECT" in the last 72 hours. Thyroid Function Tests: No results for input(s): "TSH",  "T4TOTAL", "FREET4", "T3FREE", "THYROIDAB" in the last 72 hours. Anemia Panel: No results for input(s): "VITAMINB12", "FOLATE", "FERRITIN", "TIBC", "IRON", "RETICCTPCT" in the last 72 hours. Most Recent Urinalysis On File:     Component Value Date/Time   COLORURINE YELLOW (A) 01/31/2022 1446   APPEARANCEUR CLEAR (A) 01/31/2022 1446   APPEARANCEUR Cloudy (A) 08/17/2020 1024   LABSPEC 1.018 01/31/2022 1446   PHURINE 5.0 01/31/2022 1446   GLUCOSEU NEGATIVE 01/31/2022 1446   HGBUR NEGATIVE 01/31/2022 1446   BILIRUBINUR NEGATIVE 01/31/2022 1446   BILIRUBINUR Negative 08/17/2020 1024   BILIRUBINUR negative 04/17/2015 0826   KETONESUR 5 (A) 01/31/2022 1446   PROTEINUR NEGATIVE 01/31/2022 1446   UROBILINOGEN 0.2 08/09/2020 1319   UROBILINOGEN Normal 04/17/2015 0826   NITRITE NEGATIVE 01/31/2022 1446   LEUKOCYTESUR NEGATIVE 01/31/2022 1446   LEUKOCYTESUR negative 04/17/2015 0826   Sepsis Labs: '@LABRCNTIP'$ (procalcitonin:4,lacticidven:4) Microbiology: Recent Results (from the past 240 hour(s))  Blood Culture (routine x 2)     Status: None (Preliminary result)   Collection Time: 01/31/22 10:30 AM   Specimen: BLOOD  Result Value Ref Range Status   Specimen Description BLOOD BLOOD LEFT FOREARM  Final   Special Requests   Final    BOTTLES DRAWN AEROBIC AND ANAEROBIC Blood Culture results may not be optimal due to an excessive volume of blood received in culture bottles   Culture   Final    NO GROWTH 3 DAYS Performed at [facility], [address] [place], [place] [address]    Report Status PENDING  Incomplete  Resp panel by RT-PCR (RSV, Flu A&B, Covid) Anterior Nasal Swab     Status: Abnormal   Collection Time: 01/31/22 10:31 AM   Specimen: Anterior Nasal Swab  Result Value Ref Range Status   SARS Coronavirus 2 by RT PCR NEGATIVE NEGATIVE Final    Comment: (NOTE) SARS-CoV-2 target nucleic acids are NOT DETECTED.  The SARS-CoV-2 RNA is generally detectable in upper  respiratory specimens during the acute phase  of infection. The lowest concentration of SARS-CoV-2 viral copies this assay can detect is 138 copies/mL. A negative result does not preclude SARS-Cov-2 infection and should not be used as the sole basis for treatment or other patient management decisions. A negative result may occur with  improper specimen collection/handling, submission of specimen other than nasopharyngeal swab, presence of viral mutation(s) within the areas targeted by this assay, and inadequate number of viral copies(<138 copies/mL). A negative result must be combined with clinical observations, patient history, and epidemiological information. The expected result is Negative.  Fact Sheet for Patients:  EntrepreneurPulse.com.au  Fact Sheet for Healthcare Providers:  IncredibleEmployment.be  This test is no t yet approved or cleared by the [place] FDA and  has been authorized for detection and/or diagnosis of SARS-CoV-2 by FDA under an Emergency Use Authorization (EUA). This EUA will remain  in effect (meaning this test can be used) for the duration of the COVID-19 declaration under Section 564(b)(1) of the Act, 21 U.S.C.section 360bbb-3(b)(1), unless the authorization is terminated  or revoked sooner.       Influenza A by PCR POSITIVE (A) NEGATIVE Final   Influenza B by PCR NEGATIVE NEGATIVE Final    Comment: (NOTE) The Xpert Xpress SARS-CoV-2/FLU/RSV plus assay is intended as an aid in the diagnosis of influenza from Nasopharyngeal swab specimens and should not be used as a sole basis for treatment. Nasal washings and aspirates are unacceptable for Xpert Xpress SARS-CoV-2/FLU/RSV testing.  Fact Sheet for Patients: EntrepreneurPulse.com.au  Fact Sheet for Healthcare Providers: IncredibleEmployment.be  This test is not yet approved or cleared by the [place] FDA and has been  authorized for detection and/or diagnosis of SARS-CoV-2 by FDA under an Emergency Use Authorization (EUA). This EUA will remain in effect (meaning this test can be used) for the duration of the COVID-19 declaration under Section 564(b)(1) of the Act, 21 U.S.C. section 360bbb-3(b)(1), unless the authorization is terminated or revoked.     Resp Syncytial Virus by PCR NEGATIVE NEGATIVE Final    Comment: (NOTE) Fact Sheet for Patients: EntrepreneurPulse.com.au  Fact Sheet for Healthcare Providers: IncredibleEmployment.be  This test is not yet approved or cleared by the [place] FDA and has been authorized for detection and/or diagnosis of SARS-CoV-2 by FDA under an Emergency Use Authorization (EUA). This EUA will remain in effect (meaning this test can be used) for the duration of the COVID-19 declaration under Section 564(b)(1) of the Act, 21 U.S.C. section 360bbb-3(b)(1), unless the authorization is terminated or revoked.  Performed at [facility], [address] [place], [place] [address]   Blood Culture (routine x 2)     Status: None (Preliminary result)   Collection Time: 01/31/22 10:31 AM   Specimen: BLOOD  Result Value Ref Range Status   Specimen Description BLOOD  RIGHT HAND  Final   Special Requests   Final    BOTTLES DRAWN AEROBIC AND ANAEROBIC Blood Culture adequate volume   Culture   Final    NO GROWTH 3 DAYS Performed at [facility], [address], [place], [place] [address]    Report Status PENDING  Incomplete      Radiology Studies last 3 days: DG Chest Port 1 View  Result Date: 01/31/2022 CLINICAL DATA:  Questionable sepsis, altered mental status, hallucinations EXAM: PORTABLE CHEST 1 VIEW COMPARISON:  Portable exam 1057 hours compared to 11/05/2021 FINDINGS: Enlargement of cardiac silhouette with pulmonary vascular congestion. Atherosclerotic calcification aorta. Increased interstitial markings  in both lungs greatest in  RIGHT upper lobe, could represent minimal asymmetric pulmonary edema or infection. Subsegmental atelectasis LEFT base medially. No pleural effusion or pneumothorax. Osseous structures unremarkable. IMPRESSION: Enlargement of cardiac silhouette with pulmonary vascular congestion. Accentuated interstitial markings especially RIGHT upper lobe, question minimal aseptic pulmonary edema or infection. Aortic Atherosclerosis (ICD10-I70.0). Electronically Signed   By: Lavonia Dana M.D.   On: 01/31/2022 11:06             LOS: 3 days        Emeterio Reeve, DO [facility] 02/03/2022, 4:22 PM    Dictation software may have been used to generate the above note. Typos may occur and escape review in typed/dictated notes. Please contact Dr Sheppard Coil directly for clarity if needed.  Staff may message me via secure chat in [place]  but this may not receive an immediate response,  please page me for urgent matters!  If 7PM-7AM, please contact night coverage [email or web address]

## 2022-02-04 ENCOUNTER — Inpatient Hospital Stay: Payer: Medicare Other

## 2022-02-04 DIAGNOSIS — K219 Gastro-esophageal reflux disease without esophagitis: Secondary | ICD-10-CM | POA: Diagnosis not present

## 2022-02-04 DIAGNOSIS — R6510 Systemic inflammatory response syndrome (SIRS) of non-infectious origin without acute organ dysfunction: Secondary | ICD-10-CM | POA: Diagnosis not present

## 2022-02-04 DIAGNOSIS — R4182 Altered mental status, unspecified: Secondary | ICD-10-CM | POA: Diagnosis not present

## 2022-02-04 DIAGNOSIS — G20C Parkinsonism, unspecified: Secondary | ICD-10-CM | POA: Diagnosis not present

## 2022-02-04 LAB — BASIC METABOLIC PANEL
Anion gap: 12 (ref 5–15)
BUN: 18 mg/dL (ref 8–23)
CO2: 29 mmol/L (ref 22–32)
Calcium: 7.9 mg/dL — ABNORMAL LOW (ref 8.9–10.3)
Chloride: 94 mmol/L — ABNORMAL LOW (ref 98–111)
Creatinine, Ser: 0.77 mg/dL (ref 0.61–1.24)
GFR, Estimated: >60 mL/min (ref >60)
Glucose, Bld: 101 mg/dL — ABNORMAL HIGH (ref 70–99)
Potassium: 3.1 mmol/L — ABNORMAL LOW (ref 3.5–5.1)
Sodium: 135 mmol/L (ref 135–145)

## 2022-02-04 MED ORDER — FUROSEMIDE 20 MG PO TABS
20.0000 mg | ORAL_TABLET | Freq: Every day | ORAL | Status: DC
  Administered 2022-02-04: 20 mg via ORAL
  Filled 2022-02-04: qty 1

## 2022-02-04 MED ORDER — POTASSIUM CHLORIDE 10 MEQ/100ML IV SOLN
10.0000 meq | INTRAVENOUS | Status: AC
  Administered 2022-02-04 (×4): 10 meq via INTRAVENOUS
  Filled 2022-02-04 (×2): qty 100

## 2022-02-04 MED ORDER — SODIUM CHLORIDE 0.9 % IV SOLN
500.0000 mg | INTRAVENOUS | Status: DC
  Administered 2022-02-04 – 2022-02-05 (×2): 500 mg via INTRAVENOUS
  Filled 2022-02-04: qty 500
  Filled 2022-02-04 (×2): qty 5
  Filled 2022-02-04: qty 500

## 2022-02-04 MED ORDER — FUROSEMIDE 10 MG/ML IJ SOLN
40.0000 mg | Freq: Once | INTRAMUSCULAR | Status: AC
  Administered 2022-02-04: 40 mg via INTRAVENOUS
  Filled 2022-02-04: qty 4

## 2022-02-04 MED ORDER — SODIUM CHLORIDE 0.9 % IV SOLN
2.0000 g | INTRAVENOUS | Status: DC
  Administered 2022-02-04 – 2022-02-07 (×4): 2 g via INTRAVENOUS
  Filled 2022-02-04 (×2): qty 20
  Filled 2022-02-04: qty 2
  Filled 2022-02-04 (×2): qty 20

## 2022-02-04 NOTE — Progress Notes (Signed)
Patient is alert and oriented x person and place. He has impaired memory and keeps taking his oxygen off even though he has been educated not to. Day nurse stated that oxygen was kept on due to patient dropping into the 80s on his O2 when weaning was attempted. Denied pain. Gave prn Tussinex for cough. Denied additional needs.

## 2022-02-04 NOTE — Progress Notes (Signed)
Patient is only alert to self today. This is a decline from last night. Blood pressure from 1500 was unreported but shows high. Rechecked and it was normal. Patient's temp was borderline high as well. Will recheck.

## 2022-02-04 NOTE — Progress Notes (Addendum)
PROGRESS NOTE    Randy [name]   SEG:[id number] DOB: [birth year]  DOA: 01/31/2022 Date of Service: 02/04/22 PCP: [name], DO     Brief Narrative / Hospital Course:  Randy [name] is a 88 year old male with history of lewy body dementia, hypertension, depression, insomnia, Parkinson's, who presents emergency room for chief concerns of confusion. Onset 5-6 days ago per family w. AMS/confusion, brief improvement then worsening again and family called EMS. Per ED report, EMS found patient with SpO2 of 88% on room air, patient was placed on 2 L nasal cannula with improvement. 01/25: In ED, febrile 101.1, tachypneic alternating w/ normal RR< BP stable, SpO2 96% 4L [place]. Lactic acid is 0.8.  CXR possible RUL PNA. (+)influenza A by PCR. Blood cultures x 2 in process. In ED received Azithromycin 500 mg IV every 24 hours, 5 days ordered; ceftriaxone 2 g IV every 24 hours, 5 days ordered.  Sodium chloride 1 L bolus.  LR infusion at 150 mL/h. Admitted to hospitalist service. Outside window for antiviral benefit. Procal and BCx pending.  01/26: Procalcitonin <0.10. Still on 4L [place] O2 and tachypneic in AM though RR improving into the afternoon. Confused a bit above baseline per daughter.  01/27: Still confused. On 3-5L [place] O2, slight rales, will trial Lasix po x1 today given hx G1DD 01/28: rales improved, still on 3L O2, low K replaced, low Na w/ poor po intake will hold off on restarting fluids  01/29: rales resolved on exam.     Consultants:  none  Procedures: none      ASSESSMENT & PLAN:   Principal Problem:   SIRS (systemic inflammatory response syndrome) (HCC) Active Problems:   Altered mental status   Parkinsonism   Lewy body dementia without behavioral disturbance (HCC)   Essential hypertension   GERD (gastroesophageal reflux disease)   Influenza A   Grade 1 diastolic dysfunction   SIRS (systemic inflammatory response syndrome) (HCC) Sepsis d/t Influenza A,  possible CAP RUL Patient had elevated heart rate and resp rate, Tmax documented in the hospital at 101.1  Acute hypoxic respiratory failure  CXR today: New bilateral upper lung heterogeneous opacities, likely due to infection or pulmonary edema. Restart abx: azithro and ceftriaxone Hold po lasix, give IV dose today   Influenza A Patient is outside window for antiviral benefits Continue symptomatic support  Acetaminophen 650 mg p.o. every 6 hours as needed for mild pain fever, acetaminophen 650 rectal, every 6 hours.  For mild pain and fever. Acetaminophen 1000 mg IV every 6 hours as needed for muscle aches not relieved with p.o. acetaminophen, 1 day ordered  Grade 1 diastolic dysfunction Slight rales on exam yesterday improved w/ Lasix po x1  Monitor fluid status and O2 requirement   Essential hypertension Enalapril 5 mg daily resumed for 02/01/2022 Hydralazine 5 mg IV every 8 hours as needed for SBP greater than 180, 4 days ordered  GERD (gastroesophageal reflux disease) PPI  Lewy body dementia without behavioral disturbance (HCC) Parkinsonism Rivastigmine 3 mg p.o. 3 times daily Carbidopa-levodopa 50-200 mg p.o. 4 times daily    DVT prophylaxis: lovenox  Pertinent IV fluids/nutrition: no continuous IV fluids  Central lines / invasive devices: none  Code Status: FULL CODE confirmed over the phone w/ daughter 02/01/22 2:13 PM by Dr Sheppard Coil   Current Admission Status: inpatient, PCU  [facility] needs / Dispo plan: discussing SNF w/ daughter  Barriers to discharge / significant pending items: O2 requirement, SNF placement, restarting abx today and  diuresing              Subjective / Brief ROS:  Unable to assess fully d/t dementia - speech is clear but he remains incoherent. No distress.   Family Communication:  spoke w/ daughter 02/04/22 4:44 PM over the phone     Objective Findings:  Vitals:   02/03/22 1000 02/03/22 1515 02/04/22 0010 02/04/22 0756  BP: (!)  153/79 (!) 148/76 (!) 151/85 (!) 161/82  Pulse:  73 66 65  Resp:  '16 18 16  '$ Temp:   99.4 F (37.4 C) 98.3 F (36.8 C)  TempSrc:   Axillary   SpO2:  91% 92% 92%  Weight:      Height:        Intake/Output Summary (Last 24 hours) at 02/04/2022 1511 Last data filed at 02/04/2022 1343 Gross per 24 hour  Intake 120 ml  Output 1200 ml  Net -1080 ml    Filed Weights   01/31/22 1024 02/01/22 1542  Weight: 96.8 kg 95.5 kg    Examination:  Physical Exam Constitutional:      General: He is not in acute distress. Cardiovascular:     Rate and Rhythm: Normal rate and regular rhythm.     Heart sounds: Murmur heard.  Pulmonary:     Effort: Pulmonary effort is normal. No respiratory distress.     Breath sounds: Wheezing (minimal) and rales (at bases) present.  Abdominal:     General: Abdomen is flat.     Palpations: Abdomen is soft.  Musculoskeletal:        General: No swelling or deformity.     Cervical back: Normal range of motion.  Skin:    General: Skin is warm and dry.  Neurological:     Mental Status: He is alert. He is disoriented.     Cranial Nerves: No cranial nerve deficit.     Coordination: Coordination normal.  Psychiatric:        Mood and Affect: Mood normal.          Scheduled Medications:   ascorbic acid  500 mg Oral Daily   carbidopa-levodopa  1 tablet Oral QID   cholecalciferol  1,000 Units Oral Daily   citric acid-potassium citrate  20 mEq Oral QHS   enalapril  5 mg Oral Daily   enoxaparin (LOVENOX) injection  40 mg Subcutaneous Q24H   furosemide  20 mg Oral Daily   multivitamin with minerals  1 tablet Oral Daily   pantoprazole  40 mg Oral Daily   rivastigmine  3 mg Oral BID   sertraline  50 mg Oral Daily    Continuous Infusions:    PRN Medications:  acetaminophen **OR** acetaminophen, ALPRAZolam, chlorpheniramine-HYDROcodone, diclofenac Sodium, haloperidol lactate, hydrALAZINE, HYDROcodone-acetaminophen, melatonin, ondansetron **OR**  ondansetron (ZOFRAN) IV, senna-docusate  Antimicrobials from admission:  Anti-infectives (From admission, onward)    Start     Dose/Rate Route Frequency Ordered Stop   01/31/22 1030  cefTRIAXone (ROCEPHIN) 2 g in sodium chloride 0.9 % 100 mL IVPB  Status:  Discontinued        2 g 200 mL/hr over 30 Minutes Intravenous Every 24 hours 01/31/22 1028 01/31/22 1747   01/31/22 1030  azithromycin (ZITHROMAX) 500 mg in sodium chloride 0.9 % 250 mL IVPB  Status:  Discontinued        500 mg 250 mL/hr over 60 Minutes Intravenous Every 24 hours 01/31/22 1028 01/31/22 1747           Data Reviewed:  I  have personally reviewed the following...  CBC: Recent Labs  Lab 01/31/22 1031 02/01/22 0549  WBC 4.2 5.1  NEUTROABS 3.7  --   HGB 11.2* 10.7*  HCT 34.1* 33.8*  MCV 90.7 94.4  PLT 156 131*    Basic Metabolic Panel: Recent Labs  Lab 01/31/22 1031 02/01/22 0549 02/03/22 0418 02/04/22 0203  NA 136 136 132* 135  K 3.4* 3.5 3.1* 3.1*  CL 103 103 96* 94*  CO2 '27 23 29 29  '$ GLUCOSE 119* 87 107* 101*  BUN '20 22 17 18  '$ CREATININE 0.79 0.76 0.76 0.77  CALCIUM 8.3* 7.8* 7.6* 7.9*  MG  --  1.8  --   --     GFR: Estimated Creatinine Clearance: 79.5 mL/min (by C-G formula based on SCr of 0.77 mg/dL). Liver Function Tests: Recent Labs  Lab 01/31/22 1031  AST 17  ALT 6  ALKPHOS 72  BILITOT 1.3*  PROT 6.7  ALBUMIN 3.4*    No results for input(s): "LIPASE", "AMYLASE" in the last 168 hours. No results for input(s): "AMMONIA" in the last 168 hours. Coagulation Profile: Recent Labs  Lab 01/31/22 1031  INR 1.1    Cardiac Enzymes: No results for input(s): "CKTOTAL", "CKMB", "CKMBINDEX", "TROPONINI" in the last 168 hours. BNP (last 3 results) No results for input(s): "PROBNP" in the last 8760 hours. HbA1C: No results for input(s): "HGBA1C" in the last 72 hours. CBG: No results for input(s): "GLUCAP" in the last 168 hours. Lipid Profile: No results for input(s): "CHOL",  "HDL", "LDLCALC", "TRIG", "CHOLHDL", "LDLDIRECT" in the last 72 hours. Thyroid Function Tests: No results for input(s): "TSH", "T4TOTAL", "FREET4", "T3FREE", "THYROIDAB" in the last 72 hours. Anemia Panel: No results for input(s): "VITAMINB12", "FOLATE", "FERRITIN", "TIBC", "IRON", "RETICCTPCT" in the last 72 hours. Most Recent Urinalysis On File:     Component Value Date/Time   COLORURINE YELLOW (A) 01/31/2022 1446   APPEARANCEUR CLEAR (A) 01/31/2022 1446   APPEARANCEUR Cloudy (A) 08/17/2020 1024   LABSPEC 1.018 01/31/2022 1446   PHURINE 5.0 01/31/2022 1446   GLUCOSEU NEGATIVE 01/31/2022 1446   HGBUR NEGATIVE 01/31/2022 1446   BILIRUBINUR NEGATIVE 01/31/2022 1446   BILIRUBINUR Negative 08/17/2020 1024   BILIRUBINUR negative 04/17/2015 0826   KETONESUR 5 (A) 01/31/2022 1446   PROTEINUR NEGATIVE 01/31/2022 1446   UROBILINOGEN 0.2 08/09/2020 1319   UROBILINOGEN Normal 04/17/2015 0826   NITRITE NEGATIVE 01/31/2022 1446   LEUKOCYTESUR NEGATIVE 01/31/2022 1446   LEUKOCYTESUR negative 04/17/2015 0826   Sepsis Labs: '@LABRCNTIP'$ (procalcitonin:4,lacticidven:4) Microbiology: Recent Results (from the past 240 hour(s))  Blood Culture (routine x 2)     Status: None (Preliminary result)   Collection Time: 01/31/22 10:30 AM   Specimen: BLOOD  Result Value Ref Range Status   Specimen Description BLOOD BLOOD LEFT FOREARM  Final   Special Requests   Final    BOTTLES DRAWN AEROBIC AND ANAEROBIC Blood Culture results may not be optimal due to an excessive volume of blood received in culture bottles   Culture   Final    NO GROWTH 4 DAYS Performed at [facility], [address] [place], [place] [address]    Report Status PENDING  Incomplete  Resp panel by RT-PCR (RSV, Flu A&B, Covid) Anterior Nasal Swab     Status: Abnormal   Collection Time: 01/31/22 10:31 AM   Specimen: Anterior Nasal Swab  Result Value Ref Range Status   SARS Coronavirus 2 by RT PCR NEGATIVE NEGATIVE Final     Comment: (NOTE) SARS-CoV-2 target nucleic  acids are NOT DETECTED.  The SARS-CoV-2 RNA is generally detectable in upper respiratory specimens during the acute phase of infection. The lowest concentration of SARS-CoV-2 viral copies this assay can detect is 138 copies/mL. A negative result does not preclude SARS-Cov-2 infection and should not be used as the sole basis for treatment or other patient management decisions. A negative result may occur with  improper specimen collection/handling, submission of specimen other than nasopharyngeal swab, presence of viral mutation(s) within the areas targeted by this assay, and inadequate number of viral copies(<138 copies/mL). A negative result must be combined with clinical observations, patient history, and epidemiological information. The expected result is Negative.  Fact Sheet for Patients:  EntrepreneurPulse.com.au  Fact Sheet for Healthcare Providers:  IncredibleEmployment.be  This test is no t yet approved or cleared by the [place] FDA and  has been authorized for detection and/or diagnosis of SARS-CoV-2 by FDA under an Emergency Use Authorization (EUA). This EUA will remain  in effect (meaning this test can be used) for the duration of the COVID-19 declaration under Section 564(b)(1) of the Act, 21 U.S.C.section 360bbb-3(b)(1), unless the authorization is terminated  or revoked sooner.       Influenza A by PCR POSITIVE (A) NEGATIVE Final   Influenza B by PCR NEGATIVE NEGATIVE Final    Comment: (NOTE) The Xpert Xpress SARS-CoV-2/FLU/RSV plus assay is intended as an aid in the diagnosis of influenza from Nasopharyngeal swab specimens and should not be used as a sole basis for treatment. Nasal washings and aspirates are unacceptable for Xpert Xpress SARS-CoV-2/FLU/RSV testing.  Fact Sheet for Patients: EntrepreneurPulse.com.au  Fact Sheet for Healthcare  Providers: IncredibleEmployment.be  This test is not yet approved or cleared by the [place] FDA and has been authorized for detection and/or diagnosis of SARS-CoV-2 by FDA under an Emergency Use Authorization (EUA). This EUA will remain in effect (meaning this test can be used) for the duration of the COVID-19 declaration under Section 564(b)(1) of the Act, 21 U.S.C. section 360bbb-3(b)(1), unless the authorization is terminated or revoked.     Resp Syncytial Virus by PCR NEGATIVE NEGATIVE Final    Comment: (NOTE) Fact Sheet for Patients: EntrepreneurPulse.com.au  Fact Sheet for Healthcare Providers: IncredibleEmployment.be  This test is not yet approved or cleared by the [place] FDA and has been authorized for detection and/or diagnosis of SARS-CoV-2 by FDA under an Emergency Use Authorization (EUA). This EUA will remain in effect (meaning this test can be used) for the duration of the COVID-19 declaration under Section 564(b)(1) of the Act, 21 U.S.C. section 360bbb-3(b)(1), unless the authorization is terminated or revoked.  Performed at [facility], [address] [place], [place] [address]   Blood Culture (routine x 2)     Status: None (Preliminary result)   Collection Time: 01/31/22 10:31 AM   Specimen: BLOOD  Result Value Ref Range Status   Specimen Description BLOOD  RIGHT HAND  Final   Special Requests   Final    BOTTLES DRAWN AEROBIC AND ANAEROBIC Blood Culture adequate volume   Culture   Final    NO GROWTH 4 DAYS Performed at [facility], [address], [place], [place] [address]    Report Status PENDING  Incomplete      Radiology Studies last 3 days: DG Chest Port 1 View  Result Date: 02/04/2022 CLINICAL DATA:  Abnormal lung sounds EXAM: PORTABLE CHEST 1 VIEW COMPARISON:  Chest x-ray dated January 31, 2022 FINDINGS: The heart size and mediastinal  contours are within  normal limits. New bilateral upper lobe heterogeneous opacities. The visualized skeletal structures are unremarkable. IMPRESSION: New bilateral upper lung heterogeneous opacities, likely due to infection or pulmonary edema. Electronically Signed   By: Yetta Glassman M.D.   On: 02/04/2022 10:57             LOS: 4 days        Emeterio Reeve, DO [facility] 02/04/2022, 3:11 PM    Dictation software may have been used to generate the above note. Typos may occur and escape review in typed/dictated notes. Please contact Dr Sheppard Coil directly for clarity if needed.  Staff may message me via secure chat in [place]  but this may not receive an immediate response,  please page me for urgent matters!  If 7PM-7AM, please contact night coverage [email or web address]

## 2022-02-05 ENCOUNTER — Inpatient Hospital Stay: Payer: Medicare Other

## 2022-02-05 ENCOUNTER — Encounter (INDEPENDENT_AMBULATORY_CARE_PROVIDER_SITE_OTHER): Payer: Self-pay | Admitting: Nurse Practitioner

## 2022-02-05 DIAGNOSIS — G20C Parkinsonism, unspecified: Secondary | ICD-10-CM | POA: Diagnosis not present

## 2022-02-05 DIAGNOSIS — R4182 Altered mental status, unspecified: Secondary | ICD-10-CM | POA: Diagnosis not present

## 2022-02-05 DIAGNOSIS — K219 Gastro-esophageal reflux disease without esophagitis: Secondary | ICD-10-CM | POA: Diagnosis not present

## 2022-02-05 DIAGNOSIS — R6510 Systemic inflammatory response syndrome (SIRS) of non-infectious origin without acute organ dysfunction: Secondary | ICD-10-CM | POA: Diagnosis not present

## 2022-02-05 LAB — BASIC METABOLIC PANEL
Anion gap: 10 (ref 5–15)
BUN: 18 mg/dL (ref 8–23)
CO2: 29 mmol/L (ref 22–32)
Calcium: 7.7 mg/dL — ABNORMAL LOW (ref 8.9–10.3)
Chloride: 92 mmol/L — ABNORMAL LOW (ref 98–111)
Creatinine, Ser: 0.71 mg/dL (ref 0.61–1.24)
GFR, Estimated: >60 mL/min (ref >60)
Glucose, Bld: 105 mg/dL — ABNORMAL HIGH (ref 70–99)
Potassium: 3.1 mmol/L — ABNORMAL LOW (ref 3.5–5.1)
Sodium: 131 mmol/L — ABNORMAL LOW (ref 135–145)

## 2022-02-05 LAB — CULTURE, BLOOD (ROUTINE X 2)
Culture: NO GROWTH
Culture: NO GROWTH
Special Requests: ADEQUATE

## 2022-02-05 LAB — PROCALCITONIN: Procalcitonin: <0.1 ng/mL

## 2022-02-05 LAB — BRAIN NATRIURETIC PEPTIDE: B Natriuretic Peptide: 416.3 pg/mL — ABNORMAL HIGH (ref 0.0–100.0)

## 2022-02-05 MED ORDER — IPRATROPIUM-ALBUTEROL 0.5-2.5 (3) MG/3ML IN SOLN
3.0000 mL | Freq: Four times a day (QID) | RESPIRATORY_TRACT | Status: DC
  Administered 2022-02-05 – 2022-02-06 (×2): 3 mL via RESPIRATORY_TRACT
  Filled 2022-02-05 (×2): qty 3

## 2022-02-05 MED ORDER — POTASSIUM CHLORIDE 10 MEQ/100ML IV SOLN
10.0000 meq | INTRAVENOUS | Status: AC
  Administered 2022-02-05 (×4): 10 meq via INTRAVENOUS
  Filled 2022-02-05: qty 100

## 2022-02-05 MED ORDER — IOHEXOL 350 MG/ML SOLN
75.0000 mL | Freq: Once | INTRAVENOUS | Status: AC | PRN
  Administered 2022-02-05: 75 mL via INTRAVENOUS

## 2022-02-05 MED ORDER — IPRATROPIUM-ALBUTEROL 0.5-2.5 (3) MG/3ML IN SOLN: 3.0000 mL | Freq: Four times a day (QID) | RESPIRATORY_TRACT | Status: DC

## 2022-02-05 NOTE — Progress Notes (Signed)
PROGRESS NOTE    [name]   IRW:[id number] DOB: 08/19/1934  DOA: 01/31/2022 Date of Service: 02/05/22 PCP: [name], DO     Brief Narrative / Hospital Course:  [name] is a 88 year old male with history of lewy body dementia, hypertension, depression, insomnia, Parkinson's, who presents emergency room for chief concerns of confusion. Onset 5-6 days ago per family w. AMS/confusion, brief improvement then worsening again and family called EMS. Per ED report, EMS found patient with SpO2 of 88% on room air, patient was placed on 2 L nasal cannula with improvement. 01/25: In ED, febrile 101.1, tachypneic alternating w/ normal RR< BP stable, SpO2 96% 4L [place]. Lactic acid is 0.8.  CXR possible RUL PNA. (+)influenza A by PCR. Blood cultures x 2 in process. In ED received Azithromycin 500 mg IV every 24 hours, 5 days ordered; ceftriaxone 2 g IV every 24 hours, 5 days ordered.  Sodium chloride 1 L bolus.  LR infusion at 150 mL/h. Admitted to hospitalist service. Outside window for antiviral benefit. Procal and BCx pending.  01/26: Procalcitonin <0.10. Still on 4L [place] O2 and tachypneic in AM though RR improving into the afternoon. Confused a bit above baseline per daughter.  01/27: Still confused. On 3-5L [place] O2, slight rales, will trial Lasix po x1 today given hx G1DD 01/28: rales improved, still on 3L O2, low K replaced, low Na w/ poor po intake will hold off on restarting fluids  01/29: rales resolved on exam. Still high O2 requirement. CXR ordered ?new infiltrate, restarted abx and diuresis 01/30: still high O2 requirement at 4L, continue abx, trend procalcitonin and BNP.     Consultants:  none  Procedures: none      ASSESSMENT & PLAN:   Principal Problem:   SIRS (systemic inflammatory response syndrome) (HCC) Active Problems:   Altered mental status   Parkinsonism   Lewy body dementia without behavioral disturbance (HCC)   Essential hypertension   GERD  (gastroesophageal reflux disease)   Influenza A   Grade 1 diastolic dysfunction   SIRS (systemic inflammatory response syndrome) (HCC) Sepsis d/t Influenza A, possible CAP RUL Patient had elevated heart rate and resp rate, Tmax documented in the hospital at 101.1  Acute hypoxic respiratory failure  CXR yesterday: New bilateral upper lung heterogeneous opacities, likely due to infection or pulmonary edema. Restarted abx: azithro and ceftriaxone now Day 2 (also received dose in ED but d/c d/t most likely viral) Hold po lasix, give IV dose yesterday and will get BNP today, CXR unclear and exam limited d/t pt poor inspiratory effort   Influenza A Patient is outside window for antiviral benefits Continue symptomatic support   Grade 1 diastolic dysfunction Slight rales on exam improved w/ Lasix po x1  Monitor fluid status and O2 requirement  BNP today  Essential hypertension Enalapril 5 mg daily resumed for 02/01/2022 Hydralazine 5 mg IV every 8 hours as needed for SBP greater than 180, 4 days ordered  GERD (gastroesophageal reflux disease) PPI  Lewy body dementia without behavioral disturbance (HCC) Parkinsonism Rivastigmine 3 mg p.o. 3 times daily Carbidopa-levodopa 50-200 mg p.o. 4 times daily    DVT prophylaxis: lovenox  Pertinent IV fluids/nutrition: no continuous IV fluids  Central lines / invasive devices: none  Code Status: FULL CODE confirmed over the phone w/ daughter 02/01/22 2:13 PM by Dr Sheppard Coil   Current Admission Status: inpatient, PCU  [facility] needs / Dispo plan: discussing SNF w/ daughter  Barriers to discharge / significant  pending items: O2 requirement, SNF placement, restarting abx and diuresing              Subjective / Brief ROS:  Unable to assess fully d/t dementia - speech is clear but he remains incoherent. No distress.   Family Communication:  will call daughter later today and update this note if able to reach her.     Objective  Findings:  Vitals:   02/04/22 1711 02/04/22 2031 02/05/22 0004 02/05/22 0741  BP: (!) 174/121 121/72 (!) 154/80 (!) 149/87  Pulse: 92 75 80 73  Resp: '16  18 20  '$ Temp: 99.3 F (37.4 C)  97.6 F (36.4 C) 98.9 F (37.2 C)  TempSrc:      SpO2: 92%  93% 92%  Weight:      Height:        Intake/Output Summary (Last 24 hours) at 02/05/2022 1236 Last data filed at 02/05/2022 7035 Gross per 24 hour  Intake 360 ml  Output 2350 ml  Net -1990 ml    Filed Weights   01/31/22 1024 02/01/22 1542  Weight: 96.8 kg 95.5 kg    Examination:  Physical Exam Constitutional:      General: He is not in acute distress. Cardiovascular:     Rate and Rhythm: Normal rate and regular rhythm.     Heart sounds: Murmur heard.  Pulmonary:     Effort: No respiratory distress.     Breath sounds: Wheezing (minimal) present. No rales (at bases).  Abdominal:     General: Abdomen is flat.     Palpations: Abdomen is soft.  Musculoskeletal:        General: No swelling or deformity.     Cervical back: Normal range of motion.  Skin:    General: Skin is warm and dry.  Neurological:     Mental Status: He is alert. He is disoriented.     Cranial Nerves: No cranial nerve deficit.     Coordination: Coordination normal.  Psychiatric:        Mood and Affect: Mood normal.          Scheduled Medications:   ascorbic acid  500 mg Oral Daily   carbidopa-levodopa  1 tablet Oral QID   cholecalciferol  1,000 Units Oral Daily   citric acid-potassium citrate  20 mEq Oral QHS   enalapril  5 mg Oral Daily   enoxaparin (LOVENOX) injection  40 mg Subcutaneous Q24H   multivitamin with minerals  1 tablet Oral Daily   pantoprazole  40 mg Oral Daily   rivastigmine  3 mg Oral BID   sertraline  50 mg Oral Daily    Continuous Infusions:  azithromycin 500 mg (02/04/22 1639)   cefTRIAXone (ROCEPHIN)  IV 2 g (02/04/22 1818)   potassium chloride 10 mEq (02/05/22 1149)     PRN Medications:  acetaminophen **OR**  acetaminophen, ALPRAZolam, chlorpheniramine-HYDROcodone, diclofenac Sodium, haloperidol lactate, HYDROcodone-acetaminophen, melatonin, ondansetron **OR** ondansetron (ZOFRAN) IV, senna-docusate  Antimicrobials from admission:  Anti-infectives (From admission, onward)    Start     Dose/Rate Route Frequency Ordered Stop   02/04/22 1800  cefTRIAXone (ROCEPHIN) 2 g in sodium chloride 0.9 % 100 mL IVPB        2 g 200 mL/hr over 30 Minutes Intravenous Every 24 hours 02/04/22 1545     02/04/22 1700  azithromycin (ZITHROMAX) 500 mg in sodium chloride 0.9 % 250 mL IVPB        500 mg 250 mL/hr over 60 Minutes Intravenous Every  24 hours 02/04/22 1545     01/31/22 1030  cefTRIAXone (ROCEPHIN) 2 g in sodium chloride 0.9 % 100 mL IVPB  Status:  Discontinued        2 g 200 mL/hr over 30 Minutes Intravenous Every 24 hours 01/31/22 1028 01/31/22 1747   01/31/22 1030  azithromycin (ZITHROMAX) 500 mg in sodium chloride 0.9 % 250 mL IVPB  Status:  Discontinued        500 mg 250 mL/hr over 60 Minutes Intravenous Every 24 hours 01/31/22 1028 01/31/22 1747           Data Reviewed:  I have personally reviewed the following...  CBC: Recent Labs  Lab 01/31/22 1031 02/01/22 0549  WBC 4.2 5.1  NEUTROABS 3.7  --   HGB 11.2* 10.7*  HCT 34.1* 33.8*  MCV 90.7 94.4  PLT 156 131*    Basic Metabolic Panel: Recent Labs  Lab 01/31/22 1031 02/01/22 0549 02/03/22 0418 02/04/22 0203 02/05/22 0143  NA 136 136 132* 135 131*  K 3.4* 3.5 3.1* 3.1* 3.1*  CL 103 103 96* 94* 92*  CO2 '27 23 29 29 29  '$ GLUCOSE 119* 87 107* 101* 105*  BUN '20 22 17 18 18  '$ CREATININE 0.79 0.76 0.76 0.77 0.71  CALCIUM 8.3* 7.8* 7.6* 7.9* 7.7*  MG  --  1.8  --   --   --     GFR: Estimated Creatinine Clearance: 79.5 mL/min (by C-G formula based on SCr of 0.71 mg/dL). Liver Function Tests: Recent Labs  Lab 01/31/22 1031  AST 17  ALT 6  ALKPHOS 72  BILITOT 1.3*  PROT 6.7  ALBUMIN 3.4*    No results for input(s):  "LIPASE", "AMYLASE" in the last 168 hours. No results for input(s): "AMMONIA" in the last 168 hours. Coagulation Profile: Recent Labs  Lab 01/31/22 1031  INR 1.1    Cardiac Enzymes: No results for input(s): "CKTOTAL", "CKMB", "CKMBINDEX", "TROPONINI" in the last 168 hours. BNP (last 3 results) No results for input(s): "PROBNP" in the last 8760 hours. HbA1C: No results for input(s): "HGBA1C" in the last 72 hours. CBG: No results for input(s): "GLUCAP" in the last 168 hours. Lipid Profile: No results for input(s): "CHOL", "HDL", "LDLCALC", "TRIG", "CHOLHDL", "LDLDIRECT" in the last 72 hours. Thyroid Function Tests: No results for input(s): "TSH", "T4TOTAL", "FREET4", "T3FREE", "THYROIDAB" in the last 72 hours. Anemia Panel: No results for input(s): "VITAMINB12", "FOLATE", "FERRITIN", "TIBC", "IRON", "RETICCTPCT" in the last 72 hours. Most Recent Urinalysis On File:     Component Value Date/Time   COLORURINE YELLOW (A) 01/31/2022 1446   APPEARANCEUR CLEAR (A) 01/31/2022 1446   APPEARANCEUR Cloudy (A) 08/17/2020 1024   LABSPEC 1.018 01/31/2022 1446   PHURINE 5.0 01/31/2022 1446   GLUCOSEU NEGATIVE 01/31/2022 1446   HGBUR NEGATIVE 01/31/2022 1446   BILIRUBINUR NEGATIVE 01/31/2022 1446   BILIRUBINUR Negative 08/17/2020 1024   BILIRUBINUR negative 04/17/2015 0826   KETONESUR 5 (A) 01/31/2022 1446   PROTEINUR NEGATIVE 01/31/2022 1446   UROBILINOGEN 0.2 08/09/2020 1319   UROBILINOGEN Normal 04/17/2015 0826   NITRITE NEGATIVE 01/31/2022 1446   LEUKOCYTESUR NEGATIVE 01/31/2022 1446   LEUKOCYTESUR negative 04/17/2015 0826   Sepsis Labs: '@LABRCNTIP'$ (procalcitonin:4,lacticidven:4) Microbiology: Recent Results (from the past 240 hour(s))  Blood Culture (routine x 2)     Status: None   Collection Time: 01/31/22 10:30 AM   Specimen: BLOOD  Result Value Ref Range Status   Specimen Description BLOOD BLOOD LEFT FOREARM  Final   Special Requests  Final    BOTTLES DRAWN AEROBIC AND  ANAEROBIC Blood Culture results may not be optimal due to an excessive volume of blood received in culture bottles   Culture   Final    NO GROWTH 5 DAYS Performed at [facility], [address] [place], [place] [address]    Report Status 02/05/2022 FINAL  Final  Resp panel by RT-PCR (RSV, Flu A&B, Covid) Anterior Nasal Swab     Status: Abnormal   Collection Time: 01/31/22 10:31 AM   Specimen: Anterior Nasal Swab  Result Value Ref Range Status   SARS Coronavirus 2 by RT PCR NEGATIVE NEGATIVE Final    Comment: (NOTE) SARS-CoV-2 target nucleic acids are NOT DETECTED.  The SARS-CoV-2 RNA is generally detectable in upper respiratory specimens during the acute phase of infection. The lowest concentration of SARS-CoV-2 viral copies this assay can detect is 138 copies/mL. A negative result does not preclude SARS-Cov-2 infection and should not be used as the sole basis for treatment or other patient management decisions. A negative result may occur with  improper specimen collection/handling, submission of specimen other than nasopharyngeal swab, presence of viral mutation(s) within the areas targeted by this assay, and inadequate number of viral copies(<138 copies/mL). A negative result must be combined with clinical observations, patient history, and epidemiological information. The expected result is Negative.  Fact Sheet for Patients:  EntrepreneurPulse.com.au  Fact Sheet for Healthcare Providers:  IncredibleEmployment.be  This test is no t yet approved or cleared by the [place] FDA and  has been authorized for detection and/or diagnosis of SARS-CoV-2 by FDA under an Emergency Use Authorization (EUA). This EUA will remain  in effect (meaning this test can be used) for the duration of the COVID-19 declaration under Section 564(b)(1) of the Act, 21 U.S.C.section 360bbb-3(b)(1), unless the authorization is terminated  or revoked  sooner.       Influenza A by PCR POSITIVE (A) NEGATIVE Final   Influenza B by PCR NEGATIVE NEGATIVE Final    Comment: (NOTE) The Xpert Xpress SARS-CoV-2/FLU/RSV plus assay is intended as an aid in the diagnosis of influenza from Nasopharyngeal swab specimens and should not be used as a sole basis for treatment. Nasal washings and aspirates are unacceptable for Xpert Xpress SARS-CoV-2/FLU/RSV testing.  Fact Sheet for Patients: EntrepreneurPulse.com.au  Fact Sheet for Healthcare Providers: IncredibleEmployment.be  This test is not yet approved or cleared by the [place] FDA and has been authorized for detection and/or diagnosis of SARS-CoV-2 by FDA under an Emergency Use Authorization (EUA). This EUA will remain in effect (meaning this test can be used) for the duration of the COVID-19 declaration under Section 564(b)(1) of the Act, 21 U.S.C. section 360bbb-3(b)(1), unless the authorization is terminated or revoked.     Resp Syncytial Virus by PCR NEGATIVE NEGATIVE Final    Comment: (NOTE) Fact Sheet for Patients: EntrepreneurPulse.com.au  Fact Sheet for Healthcare Providers: IncredibleEmployment.be  This test is not yet approved or cleared by the [place] FDA and has been authorized for detection and/or diagnosis of SARS-CoV-2 by FDA under an Emergency Use Authorization (EUA). This EUA will remain in effect (meaning this test can be used) for the duration of the COVID-19 declaration under Section 564(b)(1) of the Act, 21 U.S.C. section 360bbb-3(b)(1), unless the authorization is terminated or revoked.  Performed at [facility], [address] [place], [name] [address]   Blood Culture (routine x 2)     Status: None   Collection Time: 01/31/22 10:31 AM  Specimen: BLOOD  Result Value Ref Range Status   Specimen Description BLOOD  RIGHT HAND  Final   Special Requests   Final     BOTTLES DRAWN AEROBIC AND ANAEROBIC Blood Culture adequate volume   Culture   Final    NO GROWTH 5 DAYS Performed at [facility] [address], [place], [place] [address]    Report Status 02/05/2022 FINAL  Final      Radiology Studies last 3 days: DG Chest Port 1 View  Result Date: 02/04/2022 CLINICAL DATA:  Abnormal lung sounds EXAM: PORTABLE CHEST 1 VIEW COMPARISON:  Chest x-ray dated January 31, 2022 FINDINGS: The heart size and mediastinal contours are within normal limits. New bilateral upper lobe heterogeneous opacities. The visualized skeletal structures are unremarkable. IMPRESSION: New bilateral upper lung heterogeneous opacities, likely due to infection or pulmonary edema. Electronically Signed   By: Yetta Glassman M.D.   On: 02/04/2022 10:57             LOS: 5 days        Emeterio Reeve, DO [facility] 02/05/2022, 12:36 PM    Dictation software may have been used to generate the above note. Typos may occur and escape review in typed/dictated notes. Please contact Dr Sheppard Coil directly for clarity if needed.  Staff may message me via secure chat in [place]  but this may not receive an immediate response,  please page me for urgent matters!  If 7PM-7AM, please contact night coverage [email or web address]

## 2022-02-05 NOTE — Progress Notes (Signed)
SLP Note  Patient Details Name: Randy [name] MRN: [id number] DOB: 25-Jan-1934   Note:       Reason Eval/Treat Not Completed:  (chart reviewed; consulted MD and PT re: pt's status currently)  In setting of pt's Baseline dxs of lewy body Dementia and Parkinson's dis., pt's oral diet was modified to a more mech soft consistency w/ moistened, cut foods to best support his eating of meals/solid foods, and oral intake overall. MD consulted re: this decision and agreed. MD to consult if any needs arise during this admit. NSG updated.    [name], MS, CCC-SLP Speech Language Pathologist Rehab Services; [place] [phone] (ascom) [name] 02/05/2022, 4:48 PM

## 2022-02-05 NOTE — TOC Progression Note (Signed)
Transition of Care [facility]) - Progression Note    Patient Details  Name: Randy [name] MRN: [id number] Date of Birth: 10-20-1934  Transition of Care [facility]) CM/SW [place], [place] Phone Number: 02/05/2022, 10:10 AM  Clinical Narrative:     Patient has no bed offers at this time, CSW has expanded bed search.   Expected Discharge Plan: [place] Barriers to Discharge: Continued Medical Work up  Expected Discharge Plan and Services       Living arrangements for the past 2 months: Single Family Home                                       Social Determinants of Health (SDOH) Interventions SDOH Screenings   Food Insecurity: No Food Insecurity (10/05/2021)  Housing: Low Risk  (10/05/2021)  Transportation Needs: No Transportation Needs (10/05/2021)  Utilities: Not At Risk (10/05/2021)  Alcohol Screen: Low Risk  (10/05/2021)  Depression (PHQ2-9): Low Risk  (10/05/2021)  Financial Resource Strain: Low Risk  (10/05/2021)  Physical Activity: Inactive (10/05/2021)  Social Connections: Moderately Isolated (10/05/2021)  Stress: No Stress Concern Present (10/05/2021)  Tobacco Use: Medium Risk (02/05/2022)    Readmission Risk Interventions    10/11/2019    9:10 AM  Readmission Risk Prevention Plan  Transportation Screening Complete  PCP or Specialist Appt within 3-5 Days Complete  HRI or [place] Complete  Social Work Consult for [place] Planning/Counseling Complete  Palliative Care Screening Not Applicable  Medication Review Press photographer) Complete

## 2022-02-05 NOTE — Plan of Care (Signed)

## 2022-02-05 NOTE — Progress Notes (Signed)
Physical Therapy Treatment Patient Details Name: Randy [name] MRN: [id number] DOB: 12/13/1934 Today's Date: 02/05/2022   History of Present Illness presented to ER secondary to AMS, confusion; admitted for management of sepsis related to influenza, possible CAP (R UL)    PT Comments    Patient able to complete full stand from edge of bed today, max assist +2 for persistent posterior bias (though improved from previous session).  Achieves full, upright position, but unable to initiate stepping/pivot due to fear and anxiety with standing efforts. Ultimately able to complete bed/chair transfer vial lateral/scoot pivot towards L, max assist +2 for trunk lean, weight shift, lifting and lateral movement.  Completed in small, incremental movements to avoid reflexive resistance from patient during transfer.  Recommend use of hoyer for return to bed with nursing.  RN informed/aware and sling placed in room.  Of note, sats >90% on supplemental O2 throughout session.    Recommendations for follow up therapy are one component of a multi-disciplinary discharge planning process, led by the attending physician.  Recommendations may be updated based on patient status, additional functional criteria and insurance authorization.  Follow Up Recommendations  Skilled nursing-short term rehab (<3 hours/day) Can patient physically be transported by private vehicle: No   Assistance Recommended at Discharge Frequent or constant Supervision/Assistance  Patient can return home with the following Two people to help with walking and/or transfers;Two people to help with bathing/dressing/bathroom   Equipment Recommendations       Recommendations for Other Services       Precautions / Restrictions Precautions Precautions: Fall Restrictions Weight Bearing Restrictions: No     Mobility  Bed Mobility Overal bed mobility: Needs Assistance Bed Mobility: Supine to Sit     Supine to sit: Mod assist, +2 for  physical assistance          Transfers Overall transfer level: Needs assistance Equipment used: Rolling walker (2 wheels) Transfers: Sit to/from Stand, Bed to chair/wheelchair/BSC Sit to Stand: Max assist, +2 physical assistance, From elevated surface           General transfer comment: sit/stand x2 with RW, extensive assist for lift off and standing balance; continues with heavy posterior lean, but improved from initial evaluation.  Able to achieve full stance this date, but very fearful/anxious and unable to progres towards stepping/pivot attempts.  Assisted to recliner via scoot pivot, max assist +2, over level surfaces; assist for forward trunk lean, lift off and lateral movement.    Ambulation/Gait               General Gait Details: unsafe/unable; WC level as primary mobility at baseline   Stairs             Wheelchair Mobility    Modified Rankin (Stroke Patients Only)       Balance Overall balance assessment: Needs assistance Sitting-balance support: No upper extremity supported, Feet supported Sitting balance-Leahy Scale: Fair Sitting balance - Comments: heavy posterior lean initially, improved to close supervision once accommodated to position     Standing balance-Leahy Scale: Zero Standing balance comment: +2 max assist to maintain                            Cognition Arousal/Alertness: Awake/alert   Overall Cognitive Status: History of cognitive impairments - at baseline  General Comments: Oriented to self only; pleasant and cooperative, but limited ability to follow verbal commands without hand-over-hand/act assist for all activities        Exercises Other Exercises Other Exercises: Unsupported sitting edge of bed, participated with dynamic reaching activites with R UE to facilitate forward trunk lean/lumbar extension for improved anterior weight translation with standing/transfer  attempts.  Some difficulty with fine motor coordination of R UE, but able to grossly grasp items with increased time/effort    General Comments        Pertinent Vitals/Pain      Home Living                          Prior Function            PT Goals (current goals can now be found in the care plan section) Acute Rehab PT Goals Patient Stated Goal: to be able to stand and pivot with +1 for return home PT Goal Formulation: With patient/family Time For Goal Achievement: 02/16/22 Potential to Achieve Goals: Fair Progress towards PT goals: Progressing toward goals    Frequency    Min 2X/week      PT Plan Current plan remains appropriate    Co-evaluation              AM-PAC PT "6 Clicks" Mobility   Outcome Measure  Help needed turning from your back to your side while in a flat bed without using bedrails?: A Lot Help needed moving from lying on your back to sitting on the side of a flat bed without using bedrails?: A Lot Help needed moving to and from a bed to a chair (including a wheelchair)?: A Lot Help needed standing up from a chair using your arms (e.g., wheelchair or bedside chair)?: A Lot Help needed to walk in hospital room?: Total Help needed climbing 3-5 steps with a railing? : Total 6 Click Score: 10    End of Session Equipment Utilized During Treatment: Gait belt;Oxygen Activity Tolerance: Patient tolerated treatment well Patient left: with call bell/phone within reach;in chair;with chair alarm set Nurse Communication: Mobility status PT Visit Diagnosis: Muscle weakness (generalized) (M62.81);Difficulty in walking, not elsewhere classified (R26.2)     Time: 8185-6314 PT Time Calculation (min) (ACUTE ONLY): 40 min  Charges:  $Therapeutic Activity: 38-52 mins                     Nawal Burling H. Owens Shark, PT, DPT, NCS 02/05/22, 4:53 PM [phone]

## 2022-02-05 NOTE — Progress Notes (Addendum)
PROGRESS NOTE    Randy [name]   KNL:[id number] DOB: [birth year]  DOA: 01/31/2022 Date of Service: 02/05/22 PCP: [name], DO     Brief Narrative / Hospital Course:  [name] is a 88 year old male with history of lewy body dementia, hypertension, depression, insomnia, Parkinson's, who presents emergency room for chief concerns of confusion. Onset 5-6 days ago per family w. AMS/confusion, brief improvement then worsening again and family called EMS. Per ED report, EMS found patient with SpO2 of 88% on room air, patient was placed on 2 L nasal cannula with improvement. 01/25: In ED, febrile 101.1, tachypneic alternating w/ normal RR< BP stable, SpO2 96% 4L [place]. Lactic acid is 0.8.  CXR possible RUL PNA. (+)influenza A by PCR. Blood cultures x 2 in process. In ED received Azithromycin 500 mg IV every 24 hours, 5 days ordered; ceftriaxone 2 g IV every 24 hours, 5 days ordered.  Sodium chloride 1 L bolus.  LR infusion at 150 mL/h. Admitted to hospitalist service. Outside window for antiviral benefit. Procal and BCx pending.  01/26: Procalcitonin <0.10. Still on 4L [place] O2 and tachypneic in AM though RR improving into the afternoon. Confused a bit above baseline per daughter.  01/27: Still confused. On 3-5L [place] O2, slight rales, will trial Lasix po x1 today given hx G1DD 01/28: rales improved, still on 3L O2, low K replaced, low Na w/ poor po intake will hold off on restarting fluids  01/29: rales resolved on exam. Still high O2 requirement. CXR ordered ?new infiltrate, restarted abx and diuresis 01/30: still high O2 requirement at 4L, continue abx, trend procalcitonin and BNP --> no concerns on either. CTA neg PE but (+)groundglass bilateral opacity concern for atypical PNA.     Consultants:  none  Procedures: none      ASSESSMENT & PLAN:   Principal Problem:   SIRS (systemic inflammatory response syndrome) (HCC) Active Problems:   Altered mental status    Parkinsonism   Lewy body dementia without behavioral disturbance (HCC)   Essential hypertension   GERD (gastroesophageal reflux disease)   Influenza A   Grade 1 diastolic dysfunction   SIRS (systemic inflammatory response syndrome) (HCC) Sepsis d/t Influenza A, possible CAP RUL Patient had elevated heart rate and resp rate, Tmax documented in the hospital at 101.1  Acute hypoxic respiratory failure - Influenza + CAP/CHF CXR yesterday: New bilateral upper lung heterogeneous opacities, likely due to infection or pulmonary edema.  PNA/Flu: Restarted abx: azithro and ceftriaxone now Day 2 (also received dose in ED but d/c d/t most likely viral) - procalcitonin negative, however CT concerning for atypical PNA. Await sputum cx, MRSA screen but no significant MRSA/Pseudomonas risk factors.  ?CHF - Hold po lasix, give IV dose yesterday got BNP today, CXR unclear and exam limited d/t pt poor inspiratory effort - BNP up somewhat but not severe  ?PE - r/o  Influenza A Patient was outside window for antiviral benefits Likely cause for symptoms however worsening opacities on imaging concerning  Continue symptomatic support  Added DuoNebs   Grade 1 diastolic dysfunction Slight rales on exam improved w/ Lasix po x1  Monitor fluid status and O2 requirement  BNP today minimal elevation, hold further diuresis   Essential hypertension Enalapril 5 mg daily resumed for 02/01/2022 Hydralazine 5 mg IV every 8 hours as needed for SBP greater than 180, 4 days ordered  GERD (gastroesophageal reflux disease) PPI  Lewy body dementia without behavioral disturbance (HCC) Parkinsonism Rivastigmine 3 mg  p.o. 3 times daily Carbidopa-levodopa 50-200 mg p.o. 4 times daily    DVT prophylaxis: lovenox  Pertinent IV fluids/nutrition: no continuous IV fluids  Central lines / invasive devices: none  Code Status: FULL CODE confirmed over the phone w/ daughter 02/01/22 2:13 PM by Dr Sheppard Coil   Current  Admission Status: inpatient, PCU  [facility] needs / Dispo plan: discussing SNF w/ daughter  Barriers to discharge / significant pending items: O2 requirement, SNF placement, restarting abx and diuresing              Subjective / Brief ROS:  Unable to assess fully d/t dementia - speech is clear but he remains incoherent. No distress.   Family Communication: called daughter and spoke to her 02/05/22 6:41 PM     Objective Findings:  Vitals:   02/04/22 2031 02/05/22 0004 02/05/22 0741 02/05/22 1525  BP: 121/72 (!) 154/80 (!) 149/87 (!) 161/85  Pulse: 75 80 73 72  Resp:  '18 20 17  '$ Temp:  97.6 F (36.4 C) 98.9 F (37.2 C) 97.7 F (36.5 C)  TempSrc:    Oral  SpO2:  93% 92% 95%  Weight:      Height:        Intake/Output Summary (Last 24 hours) at 02/05/2022 1841 Last data filed at 02/05/2022 1731 Gross per 24 hour  Intake 1106.24 ml  Output 1550 ml  Net -443.76 ml   Filed Weights   01/31/22 1024 02/01/22 1542  Weight: 96.8 kg 95.5 kg    Examination:  Physical Exam Constitutional:      General: He is not in acute distress. Cardiovascular:     Rate and Rhythm: Normal rate and regular rhythm.     Heart sounds: Murmur heard.  Pulmonary:     Effort: No respiratory distress.     Breath sounds: Wheezing (minimal) present. No rales (at bases).  Abdominal:     General: Abdomen is flat.     Palpations: Abdomen is soft.  Musculoskeletal:        General: No swelling or deformity.     Cervical back: Normal range of motion.  Skin:    General: Skin is warm and dry.  Neurological:     Mental Status: He is alert. He is disoriented.     Cranial Nerves: No cranial nerve deficit.     Coordination: Coordination normal.  Psychiatric:        Mood and Affect: Mood normal.          Scheduled Medications:   ascorbic acid  500 mg Oral Daily   carbidopa-levodopa  1 tablet Oral QID   cholecalciferol  1,000 Units Oral Daily   citric acid-potassium citrate  20 mEq Oral QHS    enalapril  5 mg Oral Daily   enoxaparin (LOVENOX) injection  40 mg Subcutaneous Q24H   ipratropium-albuterol  3 mL Nebulization Q6H   multivitamin with minerals  1 tablet Oral Daily   pantoprazole  40 mg Oral Daily   rivastigmine  3 mg Oral BID   sertraline  50 mg Oral Daily    Continuous Infusions:  azithromycin Stopped (02/05/22 1706)   cefTRIAXone (ROCEPHIN)  IV 200 mL/hr at 02/05/22 1731     PRN Medications:  ALPRAZolam, chlorpheniramine-HYDROcodone, diclofenac Sodium, haloperidol lactate, HYDROcodone-acetaminophen, melatonin, senna-docusate  Antimicrobials from admission:  Anti-infectives (From admission, onward)    Start     Dose/Rate Route Frequency Ordered Stop   02/04/22 1800  cefTRIAXone (ROCEPHIN) 2 g in sodium chloride 0.9 % 100  mL IVPB        2 g 200 mL/hr over 30 Minutes Intravenous Every 24 hours 02/04/22 1545     02/04/22 1700  azithromycin (ZITHROMAX) 500 mg in sodium chloride 0.9 % 250 mL IVPB        500 mg 250 mL/hr over 60 Minutes Intravenous Every 24 hours 02/04/22 1545     01/31/22 1030  cefTRIAXone (ROCEPHIN) 2 g in sodium chloride 0.9 % 100 mL IVPB  Status:  Discontinued        2 g 200 mL/hr over 30 Minutes Intravenous Every 24 hours 01/31/22 1028 01/31/22 1747   01/31/22 1030  azithromycin (ZITHROMAX) 500 mg in sodium chloride 0.9 % 250 mL IVPB  Status:  Discontinued        500 mg 250 mL/hr over 60 Minutes Intravenous Every 24 hours 01/31/22 1028 01/31/22 1747           Data Reviewed:  I have personally reviewed the following...  CBC: Recent Labs  Lab 01/31/22 1031 02/01/22 0549  WBC 4.2 5.1  NEUTROABS 3.7  --   HGB 11.2* 10.7*  HCT 34.1* 33.8*  MCV 90.7 94.4  PLT 156 657*   Basic Metabolic Panel: Recent Labs  Lab 01/31/22 1031 02/01/22 0549 02/03/22 0418 02/04/22 0203 02/05/22 0143  NA 136 136 132* 135 131*  K 3.4* 3.5 3.1* 3.1* 3.1*  CL 103 103 96* 94* 92*  CO2 '27 23 29 29 29  '$ GLUCOSE 119* 87 107* 101* 105*  BUN '20 22 17  18 18  '$ CREATININE 0.79 0.76 0.76 0.77 0.71  CALCIUM 8.3* 7.8* 7.6* 7.9* 7.7*  MG  --  1.8  --   --   --    GFR: Estimated Creatinine Clearance: 79.5 mL/min (by C-G formula based on SCr of 0.71 mg/dL). Liver Function Tests: Recent Labs  Lab 01/31/22 1031  AST 17  ALT 6  ALKPHOS 72  BILITOT 1.3*  PROT 6.7  ALBUMIN 3.4*   No results for input(s): "LIPASE", "AMYLASE" in the last 168 hours. No results for input(s): "AMMONIA" in the last 168 hours. Coagulation Profile: Recent Labs  Lab 01/31/22 1031  INR 1.1   Cardiac Enzymes: No results for input(s): "CKTOTAL", "CKMB", "CKMBINDEX", "TROPONINI" in the last 168 hours. BNP (last 3 results) No results for input(s): "PROBNP" in the last 8760 hours. HbA1C: No results for input(s): "HGBA1C" in the last 72 hours. CBG: No results for input(s): "GLUCAP" in the last 168 hours. Lipid Profile: No results for input(s): "CHOL", "HDL", "LDLCALC", "TRIG", "CHOLHDL", "LDLDIRECT" in the last 72 hours. Thyroid Function Tests: No results for input(s): "TSH", "T4TOTAL", "FREET4", "T3FREE", "THYROIDAB" in the last 72 hours. Anemia Panel: No results for input(s): "VITAMINB12", "FOLATE", "FERRITIN", "TIBC", "IRON", "RETICCTPCT" in the last 72 hours. Most Recent Urinalysis On File:     Component Value Date/Time   COLORURINE YELLOW (A) 01/31/2022 1446   APPEARANCEUR CLEAR (A) 01/31/2022 1446   APPEARANCEUR Cloudy (A) 08/17/2020 1024   LABSPEC 1.018 01/31/2022 1446   PHURINE 5.0 01/31/2022 1446   GLUCOSEU NEGATIVE 01/31/2022 1446   HGBUR NEGATIVE 01/31/2022 1446   BILIRUBINUR NEGATIVE 01/31/2022 1446   BILIRUBINUR Negative 08/17/2020 1024   BILIRUBINUR negative 04/17/2015 0826   KETONESUR 5 (A) 01/31/2022 1446   PROTEINUR NEGATIVE 01/31/2022 1446   UROBILINOGEN 0.2 08/09/2020 1319   UROBILINOGEN Normal 04/17/2015 0826   NITRITE NEGATIVE 01/31/2022 1446   LEUKOCYTESUR NEGATIVE 01/31/2022 1446   LEUKOCYTESUR negative 04/17/2015 0826    Sepsis Labs: @  LABRCNTIP(procalcitonin:4,lacticidven:4) Microbiology: Recent Results (from the past 240 hour(s))  Blood Culture (routine x 2)     Status: None   Collection Time: 01/31/22 10:30 AM   Specimen: BLOOD  Result Value Ref Range Status   Specimen Description BLOOD BLOOD LEFT FOREARM  Final   Special Requests   Final    BOTTLES DRAWN AEROBIC AND ANAEROBIC Blood Culture results may not be optimal due to an excessive volume of blood received in culture bottles   Culture   Final    NO GROWTH 5 DAYS Performed at [facility], [address] [place], [place] [address]    Report Status 02/05/2022 FINAL  Final  Resp panel by RT-PCR (RSV, Flu A&B, Covid) Anterior Nasal Swab     Status: Abnormal   Collection Time: 01/31/22 10:31 AM   Specimen: Anterior Nasal Swab  Result Value Ref Range Status   SARS Coronavirus 2 by RT PCR NEGATIVE NEGATIVE Final    Comment: (NOTE) SARS-CoV-2 target nucleic acids are NOT DETECTED.  The SARS-CoV-2 RNA is generally detectable in upper respiratory specimens during the acute phase of infection. The lowest concentration of SARS-CoV-2 viral copies this assay can detect is 138 copies/mL. A negative result does not preclude SARS-Cov-2 infection and should not be used as the sole basis for treatment or other patient management decisions. A negative result may occur with  improper specimen collection/handling, submission of specimen other than nasopharyngeal swab, presence of viral mutation(s) within the areas targeted by this assay, and inadequate number of viral copies(<138 copies/mL). A negative result must be combined with clinical observations, patient history, and epidemiological information. The expected result is Negative.  Fact Sheet for Patients:  EntrepreneurPulse.com.au  Fact Sheet for Healthcare Providers:  IncredibleEmployment.be  This test is no t yet approved or cleared by the [place] FDA and  has been authorized for detection and/or diagnosis of SARS-CoV-2 by FDA under an Emergency Use Authorization (EUA). This EUA will remain  in effect (meaning this test can be used) for the duration of the COVID-19 declaration under Section 564(b)(1) of the Act, 21 U.S.C.section 360bbb-3(b)(1), unless the authorization is terminated  or revoked sooner.       Influenza A by PCR POSITIVE (A) NEGATIVE Final   Influenza B by PCR NEGATIVE NEGATIVE Final    Comment: (NOTE) The Xpert Xpress SARS-CoV-2/FLU/RSV plus assay is intended as an aid in the diagnosis of influenza from Nasopharyngeal swab specimens and should not be used as a sole basis for treatment. Nasal washings and aspirates are unacceptable for Xpert Xpress SARS-CoV-2/FLU/RSV testing.  Fact Sheet for Patients: EntrepreneurPulse.com.au  Fact Sheet for Healthcare Providers: IncredibleEmployment.be  This test is not yet approved or cleared by the [place] FDA and has been authorized for detection and/or diagnosis of SARS-CoV-2 by FDA under an Emergency Use Authorization (EUA). This EUA will remain in effect (meaning this test can be used) for the duration of the COVID-19 declaration under Section 564(b)(1) of the Act, 21 U.S.C. section 360bbb-3(b)(1), unless the authorization is terminated or revoked.     Resp Syncytial Virus by PCR NEGATIVE NEGATIVE Final    Comment: (NOTE) Fact Sheet for Patients: EntrepreneurPulse.com.au  Fact Sheet for Healthcare Providers: IncredibleEmployment.be  This test is not yet approved or cleared by the [place] FDA and has been authorized for detection and/or diagnosis of SARS-CoV-2 by FDA under an Emergency Use Authorization (EUA). This EUA will remain in effect (meaning this test can be used) for the duration of  the COVID-19 declaration under Section 564(b)(1) of the Act, 21 U.S.C. section  360bbb-3(b)(1), unless the authorization is terminated or revoked.  Performed at [facility], [address] [place], [place] [address]   Blood Culture (routine x 2)     Status: None   Collection Time: 01/31/22 10:31 AM   Specimen: BLOOD  Result Value Ref Range Status   Specimen Description BLOOD  RIGHT HAND  Final   Special Requests   Final    BOTTLES DRAWN AEROBIC AND ANAEROBIC Blood Culture adequate volume   Culture   Final    NO GROWTH 5 DAYS Performed at [facility], [address] [place], [place] [address]    Report Status 02/05/2022 FINAL  Final      Radiology Studies last 3 days: CT Angio Chest Pulmonary Embolism (PE) W or WO Contrast  Result Date: 02/05/2022 CLINICAL DATA:  Progressive confusion and hypoxemia. Clinical concern for pulmonary embolism, high probability. History of dementia, hypertension and Parkinson's disease. EXAM: CT ANGIOGRAPHY CHEST WITH CONTRAST TECHNIQUE: Multidetector CT imaging of the chest was performed using the standard protocol during bolus administration of intravenous contrast. Multiplanar CT image reconstructions and MIPs were obtained to evaluate the vascular anatomy. RADIATION DOSE REDUCTION: This exam was performed according to the departmental dose-optimization program which includes automated exposure control, adjustment of the mA and/or kV according to patient size and/or use of iterative reconstruction technique. CONTRAST:  64m OMNIPAQUE IOHEXOL 350 MG/ML SOLN COMPARISON:  Chest radiographs 02/04/2022 and 01/31/2022. Cardiac CT 07/04/2020. Abdominal CT 09/08/2018. FINDINGS: Cardiovascular: The pulmonary arteries are well opacified with contrast to the level of the subsegmental branches. There is no evidence of acute pulmonary embolism. Atherosclerosis of the aorta, great vessels and coronary arteries. Although opacification of the thoracic aorta is limited, there is no evidence of acute systemic arterial abnormality.  Aortic valvular calcifications are noted. The heart is mildly enlarged and there is minimal pericardial fluid. Mediastinum/Nodes: Mildly prominent right hilar and subcarinal lymph nodes are likely reactive. No other enlarged thoracic lymph nodes demonstrated. The thyroid gland, trachea and esophagus demonstrate no significant findings. Lungs/Pleura: Small right-greater-than-left pleural effusions. As seen on yesterday CT, there are new ground-glass opacities throughout both upper lobes, worse on the right. Mild dependent pulmonary opacities bilaterally likely represent atelectasis. There is evidence of mild underlying centrilobular and paraseptal emphysema. Scattered small calcified granulomas are noted. Upper abdomen: No acute findings are seen in the visualized upper abdomen. Innumerable renal lesions are present bilaterally, some of which measure higher than water density, likely reflecting cysts of varying complexity. These are similar to previous abdominal CT. Musculoskeletal/Chest wall: There is no chest wall mass or suspicious osseous finding. There are posttraumatic deformities of the upper sternal body and the T7 vertebral body. There is an old left-sided rib fracture. Review of the MIP images confirms the above findings. IMPRESSION: 1. No evidence of acute pulmonary embolism or other acute vascular findings in the chest. 2. New ground-glass opacities throughout both upper lobes, worse on the right, suspicious for atypical pneumonia. Recommend chest radiographic follow-up. 3. Small right-greater-than-left pleural effusions with mild dependent atelectasis bilaterally. 4. Mildly prominent right hilar and subcarinal lymph nodes, likely reactive. 5. Aortic Atherosclerosis (ICD10-I70.0) and Emphysema (ICD10-J43.9). Electronically Signed   By: WRichardean SaleM.D.   On: 02/05/2022 18:23   DG Chest Port 1 View  Result Date: 02/04/2022 CLINICAL DATA:  Abnormal lung sounds EXAM: PORTABLE CHEST 1 VIEW COMPARISON:   Chest x-ray dated January 31, 2022 FINDINGS:  The heart size and mediastinal contours are within normal limits. New bilateral upper lobe heterogeneous opacities. The visualized skeletal structures are unremarkable. IMPRESSION: New bilateral upper lung heterogeneous opacities, likely due to infection or pulmonary edema. Electronically Signed   By: Yetta Glassman M.D.   On: 02/04/2022 10:57             LOS: 5 days        Emeterio Reeve, DO [facility] 02/05/2022, 6:41 PM    Dictation software may have been used to generate the above note. Typos may occur and escape review in typed/dictated notes. Please contact Dr Sheppard Coil directly for clarity if needed.  Staff may message me via secure chat in [place]  but this may not receive an immediate response,  please page me for urgent matters!  If 7PM-7AM, please contact night coverage [email or web address]

## 2022-02-05 NOTE — Progress Notes (Signed)
Subjective:    Patient ID: Randy [name], male    DOB: Aug 31, 1934, 88 y.o.   MRN: [id number] Chief Complaint  Patient presents with   Follow-up    6 month follow up    The patient returns to the office for followup evaluation regarding leg swelling.  The swelling has persisted and the pain associated with swelling continues. There have not been any interval development of a ulcerations or wounds.  Since the previous visit the patient has been wearing graduated compression stockings and has noted little if any improvement in the lymphedema. The patient has been using compression routinely morning until night.  The patient also states elevation during the day and exercise is being done too.      Review of Systems  Cardiovascular:  Positive for leg swelling.  All other systems reviewed and are negative.      Objective:   Physical Exam Vitals reviewed.  HENT:     Head: Normocephalic.  Cardiovascular:     Rate and Rhythm: Normal rate.  Pulmonary:     Effort: Pulmonary effort is normal.  Musculoskeletal:     Right lower leg: Edema present.     Left lower leg: Edema present.  Skin:    General: Skin is warm and dry.     Comments: Hyperpigmentation lower extremities  Neurological:     Mental Status: He is alert and oriented to person, place, and time.  Psychiatric:        Mood and Affect: Mood normal.        Behavior: Behavior normal.        Thought Content: Thought content normal.        Judgment: Judgment normal.     BP (!) 151/91 (BP Location: Right Arm)   Pulse 78   Past Medical History:  Diagnosis Date   Arthritis    GERD (gastroesophageal reflux disease)    Hyperlipidemia    Hypertension    Lewy body dementia (HCC)    Osteoporosis    Prostate enlargement     Social History   Socioeconomic History   Marital status: Married    Spouse name: Not on file   Number of children: Not on file   Years of education: College   Highest education level: Not on  file  Occupational History   Occupation: Retired Clinical biochemist  Tobacco Use   Smoking status: Former    Packs/day: 1.00    Years: 12.00    Total pack years: 12.00    Types: Cigarettes    Quit date: 1968    Years since quitting: 56.1   Smokeless tobacco: Never  Vaping Use   Vaping Use: Never used  Substance and Sexual Activity   Alcohol use: No   Drug use: No   Sexual activity: Not Currently  Other Topics Concern   Not on file  Social History Narrative   Not on file   Social Determinants of Health   Financial Resource Strain: Low Risk  (10/05/2021)   Overall Financial Resource Strain (CARDIA)    Difficulty of Paying Living Expenses: Not hard at all  Food Insecurity: No Food Insecurity (10/05/2021)   Hunger Vital Sign    Worried About Running Out of Food in the Last Year: Never true    Ran Out of Food in the Last Year: Never true  Transportation Needs: No Transportation Needs (10/05/2021)   PRAPARE - Hydrologist (Medical): No    Lack of Transportation (  Non-Medical): No  Physical Activity: Inactive (10/05/2021)   Exercise Vital Sign    Days of Exercise per Week: 0 days    Minutes of Exercise per Session: 0 min  Stress: No Stress Concern Present (10/05/2021)   [place]    Feeling of Stress : Only a little  Social Connections: Moderately Isolated (10/05/2021)   Social Connection and Isolation Panel [NHANES]    Frequency of Communication with Friends and Family: Once a week    Frequency of Social Gatherings with Friends and Family: More than three times a week    Attends Religious Services: Never    Marine scientist or Organizations: No    Attends Archivist Meetings: Never    Marital Status: Married  Human resources officer Violence: Not At Risk (10/05/2021)   Humiliation, Afraid, Rape, and Kick questionnaire    Fear of Current or Ex-Partner: No    Emotionally Abused: No     Physically Abused: No    Sexually Abused: No    Past Surgical History:  Procedure Laterality Date   CHOLECYSTECTOMY N/A 09/10/2018   Procedure: LAPAROSCOPIC CHOLECYSTECTOMY;  Surgeon: Benjamine Sprague, DO;  Location: [facility] ORS;  Service: General;  Laterality: N/A;   CYSTOSCOPY WITH LITHOLAPAXY N/A 10/20/2020   Procedure: CYSTOSCOPY WITH LITHOLAPAXY;  Surgeon: [name], MD;  Location: [facility];  Service: Urology;  Laterality: N/A;   ESOPHAGOGASTRODUODENOSCOPY (EGD) WITH PROPOFOL N/A 10/11/2021   Procedure: ESOPHAGOGASTRODUODENOSCOPY (EGD) WITH PROPOFOL;  Surgeon: Lin Landsman, MD;  Location: [facility] END[facility]COPY;  Service: Gastroenterology;  Laterality: N/A;   GANGLION CYST EXCISION Left 2013   Left upper arm   HERNIA REPAIR N/A 2008   M[place]line, ventral acquired hernia repair   HOLEP-LASER ENUCLEATION OF THE PROSTATE WITH MORCELLATION N/A 11/06/2018   Procedure: HOLEP-LASER ENUCLEATION OF THE PROSTATE WITH MORCELLATION;  Surgeon: [name], MD;  Location: [facility] OR[facility];  Service: Urology;  Laterality: N/A;   HOLEP-LASER ENUCLEATION OF THE PROSTATE WITH MORCELLATION N/A 10/20/2020   Procedure: HOLEP-LASER ENUCLEATION OF THE PROSTATE WITH MORCELLATION;  Surgeon: [name], MD;  Location: [facility] ORS;  Service: Urology;  Laterality: N/A;    Family History  Problem Relation Age of Onset   Breast cancer Mother    Lung cancer Father    Heart disease Brother    Leukemia Brother    Prostate cancer Neg Hx    Colon cancer Neg Hx    Bladder Cancer Neg Hx    Kidney cancer Neg Hx     Allergies  Allergen Reactions   Antazoline Diarrhea   Antihistamines, Chlorpheniramine-Type Other (See Comments)    Can't urinate       Latest Ref Rng & Units 02/01/2022    5:49 AM 01/31/2022   10:31 AM 07/30/2021    4:13 AM  CBC  WBC 4.0 - 10.5 K/uL 5.1  4.2  5.1   Hemoglobin 13.0 - 17.0 g/dL 10.7  11.2  11.7   Hematocrit 39.0 - 52.0 % 33.8  34.1  35.2   Platelets 150 - 400 K/uL 131  156  170        CMP     Component Value Date/Time   NA 135 02/04/2022 0203   NA 143 04/17/2015 0826   K 3.1 (L) 02/04/2022 0203   K 4.0 04/17/2015 0826   CL 94 (L) 02/04/2022 0203   CL 102 04/17/2015 0826   CO2 29 02/04/2022 0203   CO2 25 11/21/2014  0905   GLUCOSE 101 (H) 02/04/2022 0203   BUN 18 02/04/2022 0203   BUN 18 04/17/2015 0826   CREATININE 0.77 02/04/2022 0203   CREATININE 1.10 04/02/2019 0925   CALCIUM 7.9 (L) 02/04/2022 0203   CALCIUM 8.5 (A) 04/17/2015 0826   PROT 6.7 01/31/2022 1031   PROT 6.3 04/17/2015 0826   ALBUMIN 3.4 (L) 01/31/2022 1031   ALBUMIN 3.9 04/17/2015 0826   AST 17 01/31/2022 1031   AST 14 04/17/2015 0826   ALT 6 01/31/2022 1031   ALT 8 04/17/2015 0826   ALKPHOS 72 01/31/2022 1031   ALKPHOS 62 04/17/2015 0826   BILITOT 1.3 (H) 01/31/2022 1031   BILITOT 0.9 04/17/2015 0826   GFRNONAA >60 02/04/2022 0203   GFRNONAA 62 04/02/2019 0925   GFRAA >60 10/12/2019 0416   GFRAA 72 04/02/2019 0925     No results found.     Assessment & Plan:   1. Lymphedema Recommend:  No surgery or intervention at this point in time.   The Patient is CEAP [id number].  The patient has been wearing compression for more than 12 weeks with no or little benefit.  The patient has been exercising daily for more than 12 weeks. The patient has been elevating and taking OTC pain medications for more than 12 weeks.  None of these have have eliminated the pain related to the lymphedema or the discomfort regarding excessive swelling and venous congestion.    I have reviewed my discussion with the patient regarding lymphedema and why it  causes symptoms.  Patient will continue wearing graduated compression on a daily basis. The patient should put the compression on first thing in the morning and removing them in the evening. The patient should not sleep in the compression.   In addition, behavioral modification throughout the day will be continued.  This will include frequent  elevation (such as in a recliner), use of over the counter pain medications as needed and exercise such as walking.  The systemic causes for chronic edema such as liver, kidney and cardiac etiologies do not appear to have significant changed over the past year.    The patient has chronic , severe lymphedema with hyperpigmentation of the skin and has done MLD, skin care, medication, diet, exercise, elevation and compression for 4 weeks with no improvement,  I am recommending a lymphedema pump.  The patient still has stage 3 lymphedema and therefore, I believe that a lymph pump is needed to improve the control of the patient's lymphedema and improve the quality of life.  Additionally, a lymph pump is warranted because it will reduce the risk of cellulitis and ulceration in the future.    2. Essential hypertension Continue antihypertensive medications as already ordered, these medications have been reviewed and there are no changes at this time.  3. Mixed hyperlipidemia Continue statin as ordered and reviewed, no changes at this time   No current facility-administered medications on file prior to visit.   Current Outpatient Medications on File Prior to Visit  Medication Sig Dispense Refill   acetaminophen (TYLENOL) 500 MG tablet Take 1,000 mg by mouth every 6 (six) hours.     carbidopa-levodopa (SINEMET CR) 50-200 MG tablet Take 1 tablet by mouth 4 (four) times daily.     cholecalciferol (VITAMIN D) 1000 units tablet Take 1,000 Units by mouth daily.     Coenzyme Q10 (CO Q 10) 100 MG CAPS Take 200 mg by mouth daily.     diclofenac Sodium (VOLTAREN) 1 %  GEL APPLY 2 G TOPICALLY 4 (FOUR) TIMES DAILY AS NEEDED. TO KNEE ARTHRITIS PAIN 100 g 3   fluticasone (FLONASE) 50 MCG/ACT nasal spray Place 2 sprays into both nostrils as needed for allergies. (Patient not taking: Reported on 01/31/2022)     furosemide (LASIX) 20 MG tablet Take 1 tablet (20 mg total) by mouth every other day. (Patient taking  differently: Take 20 mg by mouth every other day. Patient takes Mondays, Wednesdays, and Fridays.) 45 tablet 0   HYDROcodone-acetaminophen (NORCO/VICODIN) 5-325 MG tablet Take 1 tablet by mouth every 6 (six) hours as needed for moderate pain. 120 tablet 0   melatonin 5 MG TABS Take 1 tablet (5 mg total) by mouth at bedtime. (Patient taking differently: Take 10 mg by mouth at bedtime. Patient takes 10 mg at bedtime)  0   Multiple Vitamin (MULTIVITAMIN WITH MINERALS) TABS tablet Take 1 tablet by mouth daily. 30 tablet 0   Omega-3 Fatty Acids (FISH OIL) 1200 MG CAPS Take 1,200 mg by mouth in the morning, at noon, in the evening, and at bedtime.     omeprazole (PRILOSEC) 20 MG capsule Take 20 mg by mouth 2 (two) times daily.     ondansetron (ZOFRAN-ODT) 4 MG disintegrating tablet Take 1 tablet (4 mg total) by mouth every 8 (eight) hours as needed for nausea or vomiting. 20 tablet 0   rivastigmine (EXELON) 3 MG capsule Take 3 mg by mouth 2 (two) times daily.     sertraline (ZOLOFT) 50 MG tablet Take 50 mg by mouth daily.     vitamin B-12 1000 MCG tablet Take 1 tablet (1,000 mcg total) by mouth daily. (Patient not taking: Reported on 01/31/2022)     vitamin C (ASCORBIC ACID) 500 MG tablet Take 500 mg by mouth daily.      There are no Patient Instructions on file for this visit. No follow-ups on file.   Kris Hartmann, NP

## 2022-02-06 ENCOUNTER — Other Ambulatory Visit: Payer: Self-pay

## 2022-02-06 DIAGNOSIS — J101 Influenza due to other identified influenza virus with other respiratory manifestations: Secondary | ICD-10-CM | POA: Diagnosis not present

## 2022-02-06 DIAGNOSIS — G3183 Dementia with Lewy bodies: Secondary | ICD-10-CM | POA: Diagnosis not present

## 2022-02-06 DIAGNOSIS — F0280 Dementia in other diseases classified elsewhere without behavioral disturbance: Secondary | ICD-10-CM

## 2022-02-06 DIAGNOSIS — M159 Polyosteoarthritis, unspecified: Secondary | ICD-10-CM | POA: Diagnosis not present

## 2022-02-06 DIAGNOSIS — J9601 Acute respiratory failure with hypoxia: Secondary | ICD-10-CM | POA: Diagnosis not present

## 2022-02-06 DIAGNOSIS — R4182 Altered mental status, unspecified: Secondary | ICD-10-CM | POA: Diagnosis not present

## 2022-02-06 LAB — CBC
HCT: 34.4 % — ABNORMAL LOW (ref 39.0–52.0)
Hemoglobin: 11.5 g/dL — ABNORMAL LOW (ref 13.0–17.0)
MCH: 29 pg (ref 26.0–34.0)
MCHC: 33.4 g/dL (ref 30.0–36.0)
MCV: 86.9 fL (ref 80.0–100.0)
Platelets: 192 10*3/uL (ref 150–400)
RBC: 3.96 MIL/uL — ABNORMAL LOW (ref 4.22–5.81)
RDW: 13.9 % (ref 11.5–15.5)
WBC: 4.5 10*3/uL (ref 4.0–10.5)
nRBC: 0 % (ref 0.0–0.2)

## 2022-02-06 LAB — BASIC METABOLIC PANEL
Anion gap: 8 (ref 5–15)
BUN: 21 mg/dL (ref 8–23)
CO2: 31 mmol/L (ref 22–32)
Calcium: 8.1 mg/dL — ABNORMAL LOW (ref 8.9–10.3)
Chloride: 95 mmol/L — ABNORMAL LOW (ref 98–111)
Creatinine, Ser: 0.74 mg/dL (ref 0.61–1.24)
GFR, Estimated: >60 mL/min (ref >60)
Glucose, Bld: 117 mg/dL — ABNORMAL HIGH (ref 70–99)
Potassium: 3.3 mmol/L — ABNORMAL LOW (ref 3.5–5.1)
Sodium: 134 mmol/L — ABNORMAL LOW (ref 135–145)

## 2022-02-06 LAB — PROCALCITONIN: Procalcitonin: <0.1 ng/mL

## 2022-02-06 MED ORDER — IPRATROPIUM-ALBUTEROL 0.5-2.5 (3) MG/3ML IN SOLN
3.0000 mL | Freq: Four times a day (QID) | RESPIRATORY_TRACT | Status: DC
  Administered 2022-02-06: 3 mL via RESPIRATORY_TRACT
  Filled 2022-02-06: qty 3

## 2022-02-06 MED ORDER — ENSURE ENLIVE PO LIQD
237.0000 mL | Freq: Three times a day (TID) | ORAL | Status: DC
  Administered 2022-02-06 – 2022-02-08 (×6): 237 mL via ORAL

## 2022-02-06 MED ORDER — FUROSEMIDE 10 MG/ML IJ SOLN
40.0000 mg | Freq: Once | INTRAMUSCULAR | Status: AC
  Administered 2022-02-06: 40 mg via INTRAVENOUS
  Filled 2022-02-06: qty 4

## 2022-02-06 MED ORDER — POTASSIUM CHLORIDE CRYS ER 20 MEQ PO TBCR
40.0000 meq | EXTENDED_RELEASE_TABLET | Freq: Two times a day (BID) | ORAL | Status: AC
  Administered 2022-02-06 (×2): 40 meq via ORAL
  Filled 2022-02-06 (×2): qty 2

## 2022-02-06 MED ORDER — AZITHROMYCIN 500 MG PO TABS
500.0000 mg | ORAL_TABLET | Freq: Every day | ORAL | Status: DC
  Administered 2022-02-06 – 2022-02-08 (×3): 500 mg via ORAL
  Filled 2022-02-06 (×3): qty 1

## 2022-02-06 MED ORDER — IPRATROPIUM-ALBUTEROL 0.5-2.5 (3) MG/3ML IN SOLN
3.0000 mL | Freq: Two times a day (BID) | RESPIRATORY_TRACT | Status: DC
  Administered 2022-02-07 – 2022-02-08 (×3): 3 mL via RESPIRATORY_TRACT
  Filled 2022-02-06 (×3): qty 3

## 2022-02-06 NOTE — Progress Notes (Addendum)
PROGRESS NOTE  NEEL BUFFONE   JTT:[id number] DOB: 01/09/1934  DOA: 01/31/2022 Date of Service: 02/06/22 PCP: [name], DO  Brief Narrative / Hospital Course:  Mr. Stevin Bielinski is a 88 year old male with history of lewy body dementia, hypertension, depression, insomnia, Parkinson's, who presents emergency room for chief concerns of confusion. Onset 5-6 days ago per family w. AMS/confusion, brief improvement then worsening again and family called EMS. Per ED report, EMS found patient with SpO2 of 88% on room air, patient was placed on 2 L nasal cannula with improvement. 01/25: In ED, febrile 101.1, tachypneic alternating w/ normal RR< BP stable, SpO2 96% 4L [place]. Lactic acid is 0.8.  CXR possible RUL PNA. (+)influenza A by PCR. Blood cultures x 2 in process. In ED received Azithromycin 500 mg IV every 24 hours, 5 days ordered; ceftriaxone 2 g IV every 24 hours, 5 days ordered.  Sodium chloride 1 L bolus.  LR infusion at 150 mL/h. Admitted to hospitalist service. Outside window for antiviral benefit. Procal and BCx pending.  01/26: Procalcitonin <0.10. Still on 4L [place] O2 and tachypneic in AM though RR improving into the afternoon. Confused a bit above baseline per daughter.  01/27: Still confused. On 3-5L [place] O2, slight rales, will trial Lasix po x1 today given hx G1DD 01/28: rales improved, still on 3L O2, low K replaced, low Na w/ poor po intake will hold off on restarting fluids  01/29: rales resolved on exam. Still high O2 requirement. CXR ordered ?new infiltrate, restarted abx and diuresis 01/30: still high O2 requirement at 4L, continue abx, trend procalcitonin and BNP --> no concerns on either. CTA neg PE but (+)groundglass bilateral opacity concern for atypical PNA.   Consultants:  none  Procedures: none  ASSESSMENT & PLAN:   Principal Problem:   SIRS (systemic inflammatory response syndrome) (HCC) Active Problems:   Altered mental status   Parkinsonism   Lewy body  dementia without behavioral disturbance (HCC)   Essential hypertension   GERD (gastroesophageal reflux disease)   Influenza A   Grade 1 diastolic dysfunction  Sepsis d/t Influenza A, 2/2 CAP RUL Patient had elevated heart rate and resp rate, Tmax documented in the hospital at 101.1  Acute hypoxic respiratory failure- improving slowly. Currently on 3L [place] Influenza +  CAP New bilateral upper lung heterogeneous opacities on chest xray and confirmed with CTA Continue azithro and ceftriaxone (also received dose in ED but d/c d/t most likely viral) - procalcitonin negative, however CT concerning for atypical PNA. Await sputum cx, MRSA screen but no significant MRSA/Pseudomonas risk factors.  Wean to room air PRN Breathing treatments PRN  Influenza A-Patient was outside window for antiviral benefits Continue  DuoNebs  Supportive care PRN  Grade 1 diastolic dysfunction HF- euvolemic to mildly hypervolemic on exam with mild rhales in bases Monitor fluid status and O2 requirement  - lasix PRN  Essential hypertension Enalapril 5 mg daily resumed for 02/01/2022  GERD (gastroesophageal reflux disease) PPI  Lewy body dementia without behavioral disturbance (HCC) Parkinsonism Rivastigmine 3 mg p.o. 3 times daily Carbidopa-levodopa 50-200 mg p.o. 4 times daily  Mild hypokalemia- likely 2/2 poor oral intake - monitor and replete PRN - RD consult  DVT prophylaxis: lovenox  Pertinent IV fluids/nutrition: no continuous IV fluids  Central lines / invasive devices: none  Code Status: FULL CODE confirmed over the phone w/ daughter 02/01/22 2:13 PM by Dr Sheppard Coil   Current Admission Status: inpatient, med tele [facility] needs / Dispo plan: discussing  SNF w/ daughter  Barriers to discharge / significant pending items: O2 requirement, SNF placement, restarting abx and diuresing   Subjective / Brief ROS:  Patient reports no complaints today. He is pleasant. Denies SOB at rest. Currently finishing  breakfast with assistance.   Family Communication: none at bedside. Will update with changes  Objective Findings:  Vitals:   02/05/22 2103 02/06/22 0038 02/06/22 0247 02/06/22 0249  BP:  133/89    Pulse:  78    Resp:  18    Temp:  (!) 97.5 F (36.4 C)    TempSrc:      SpO2: 94% 96% (!) 85% 95%  Weight:      Height:        Intake/Output Summary (Last 24 hours) at 02/06/2022 0728 Last data filed at 02/06/2022 0447 Gross per 24 hour  Intake 1226.24 ml  Output 1150 ml  Net 76.24 ml    Filed Weights   01/31/22 1024 02/01/22 1542  Weight: 96.8 kg 95.5 kg    Examination:  Physical Exam Constitutional:      General: He is not in acute distress. Cardiovascular:     Rate and Rhythm: Normal rate and regular rhythm.     Heart sounds: Murmur heard.  Pulmonary:     Effort: No respiratory distress.     Breath sounds: Rales (at bases) present. No wheezing (minimal).  Abdominal:     General: Abdomen is flat.     Palpations: Abdomen is soft.  Musculoskeletal:        General: No swelling or deformity.     Cervical back: Normal range of motion.  Skin:    General: Skin is warm and dry.  Neurological:     Mental Status: He is alert. He is disoriented.     Cranial Nerves: No cranial nerve deficit.     Coordination: Coordination normal.  Psychiatric:        Mood and Affect: Mood normal.    Scheduled Medications:   ascorbic acid  500 mg Oral Daily   carbidopa-levodopa  1 tablet Oral QID   cholecalciferol  1,000 Units Oral Daily   citric acid-potassium citrate  20 mEq Oral QHS   enalapril  5 mg Oral Daily   enoxaparin (LOVENOX) injection  40 mg Subcutaneous Q24H   ipratropium-albuterol  3 mL Nebulization Q6H   multivitamin with minerals  1 tablet Oral Daily   pantoprazole  40 mg Oral Daily   rivastigmine  3 mg Oral BID   sertraline  50 mg Oral Daily    Continuous Infusions:  azithromycin Stopped (02/05/22 1706)   cefTRIAXone (ROCEPHIN)  IV 200 mL/hr at 02/05/22 1731    PRN Medications:  ALPRAZolam, chlorpheniramine-HYDROcodone, diclofenac Sodium, haloperidol lactate, HYDROcodone-acetaminophen, melatonin, senna-docusate  Antimicrobials from admission:  Anti-infectives (From admission, onward)    Start     Dose/Rate Route Frequency Ordered Stop   02/04/22 1800  cefTRIAXone (ROCEPHIN) 2 g in sodium chloride 0.9 % 100 mL IVPB        2 g 200 mL/hr over 30 Minutes Intravenous Every 24 hours 02/04/22 1545     02/04/22 1700  azithromycin (ZITHROMAX) 500 mg in sodium chloride 0.9 % 250 mL IVPB        500 mg 250 mL/hr over 60 Minutes Intravenous Every 24 hours 02/04/22 1545     01/31/22 1030  cefTRIAXone (ROCEPHIN) 2 g in sodium chloride 0.9 % 100 mL IVPB  Status:  Discontinued        2  g 200 mL/hr over 30 Minutes Intravenous Every 24 hours 01/31/22 1028 01/31/22 1747   01/31/22 1030  azithromycin (ZITHROMAX) 500 mg in sodium chloride 0.9 % 250 mL IVPB  Status:  Discontinued        500 mg 250 mL/hr over 60 Minutes Intravenous Every 24 hours 01/31/22 1028 01/31/22 1747       Data Reviewed:  I have personally reviewed the following.  CBC: Recent Labs  Lab 01/31/22 1031 02/01/22 0549 02/06/22 0448  WBC 4.2 5.1 4.5  NEUTROABS 3.7  --   --   HGB 11.2* 10.7* 11.5*  HCT 34.1* 33.8* 34.4*  MCV 90.7 94.4 86.9  PLT 156 131* 662    Basic Metabolic Panel: Recent Labs  Lab 02/01/22 0549 02/03/22 0418 02/04/22 0203 02/05/22 0143 02/06/22 0448  NA 136 132* 135 131* 134*  K 3.5 3.1* 3.1* 3.1* 3.3*  CL 103 96* 94* 92* 95*  CO2 '23 29 29 29 31  '$ GLUCOSE 87 107* 101* 105* 117*  BUN '22 17 18 18 21  '$ CREATININE 0.76 0.76 0.77 0.71 0.74  CALCIUM 7.8* 7.6* 7.9* 7.7* 8.1*  MG 1.8  --   --   --   --     GFR: Estimated Creatinine Clearance: 79.5 mL/min (by C-G formula based on SCr of 0.74 mg/dL). Liver Function Tests: Recent Labs  Lab 01/31/22 1031  AST 17  ALT 6  ALKPHOS 72  BILITOT 1.3*  PROT 6.7  ALBUMIN 3.4*    Microbiology: Recent  Results (from the past 240 hour(s))  Blood Culture (routine x 2)     Status: None   Collection Time: 01/31/22 10:30 AM   Specimen: BLOOD  Result Value Ref Range Status   Specimen Description BLOOD BLOOD LEFT FOREARM  Final   Special Requests   Final    BOTTLES DRAWN AEROBIC AND ANAEROBIC Blood Culture results may not be optimal due to an excessive volume of blood received in culture bottles   Culture   Final    NO GROWTH 5 DAYS Performed at [facility], [address] [place], [place] [address]    Report Status 02/05/2022 FINAL  Final  Resp panel by RT-PCR (RSV, Flu A&B, Covid) Anterior Nasal Swab     Status: Abnormal   Collection Time: 01/31/22 10:31 AM   Specimen: Anterior Nasal Swab  Result Value Ref Range Status   SARS Coronavirus 2 by RT PCR NEGATIVE NEGATIVE Final    Comment: (NOTE) SARS-CoV-2 target nucleic acids are NOT DETECTED.  The SARS-CoV-2 RNA is generally detectable in upper respiratory specimens during the acute phase of infection. The lowest concentration of SARS-CoV-2 viral copies this assay can detect is 138 copies/mL. A negative result does not preclude SARS-Cov-2 infection and should not be used as the sole basis for treatment or other patient management decisions. A negative result may occur with  improper specimen collection/handling, submission of specimen other than nasopharyngeal swab, presence of viral mutation(s) within the areas targeted by this assay, and inadequate number of viral copies(<138 copies/mL). A negative result must be combined with clinical observations, patient history, and epidemiological information. The expected result is Negative.  Fact Sheet for Patients:  EntrepreneurPulse.com.au  Fact Sheet for Healthcare Providers:  IncredibleEmployment.be  This test is no t yet approved or cleared by the [place] FDA and  has been authorized for detection and/or diagnosis of SARS-CoV-2  by FDA under an Emergency Use Authorization (EUA). This EUA will remain  in effect (meaning this  test can be used) for the duration of the COVID-19 declaration under Section 564(b)(1) of the Act, 21 U.S.C.section 360bbb-3(b)(1), unless the authorization is terminated  or revoked sooner.       Influenza A by PCR POSITIVE (A) NEGATIVE Final   Influenza B by PCR NEGATIVE NEGATIVE Final    Comment: (NOTE) The Xpert Xpress SARS-CoV-2/FLU/RSV plus assay is intended as an aid in the diagnosis of influenza from Nasopharyngeal swab specimens and should not be used as a sole basis for treatment. Nasal washings and aspirates are unacceptable for Xpert Xpress SARS-CoV-2/FLU/RSV testing.  Fact Sheet for Patients: EntrepreneurPulse.com.au  Fact Sheet for Healthcare Providers: IncredibleEmployment.be  This test is not yet approved or cleared by the [place] FDA and has been authorized for detection and/or diagnosis of SARS-CoV-2 by FDA under an Emergency Use Authorization (EUA). This EUA will remain in effect (meaning this test can be used) for the duration of the COVID-19 declaration under Section 564(b)(1) of the Act, 21 U.S.C. section 360bbb-3(b)(1), unless the authorization is terminated or revoked.     Resp Syncytial Virus by PCR NEGATIVE NEGATIVE Final    Comment: (NOTE) Fact Sheet for Patients: EntrepreneurPulse.com.au  Fact Sheet for Healthcare Providers: IncredibleEmployment.be  This test is not yet approved or cleared by the [place] FDA and has been authorized for detection and/or diagnosis of SARS-CoV-2 by FDA under an Emergency Use Authorization (EUA). This EUA will remain in effect (meaning this test can be used) for the duration of the COVID-19 declaration under Section 564(b)(1) of the Act, 21 U.S.C. section 360bbb-3(b)(1), unless the authorization is terminated or revoked.  Performed at  [facility], [address] [place], [place] [address]   Blood Culture (routine x 2)     Status: None   Collection Time: 01/31/22 10:31 AM   Specimen: BLOOD  Result Value Ref Range Status   Specimen Description BLOOD  RIGHT HAND  Final   Special Requests   Final    BOTTLES DRAWN AEROBIC AND ANAEROBIC Blood Culture adequate volume   Culture   Final    NO GROWTH 5 DAYS Performed at [facility], [address], [place], [place] [address]    Report Status 02/05/2022 FINAL  Final     Radiology Studies last 3 days: CT Angio Chest Pulmonary Embolism (PE) W or WO Contrast  Result Date: 02/05/2022 CLINICAL DATA:  Progressive confusion and hypoxemia. Clinical concern for pulmonary embolism, high probability. History of dementia, hypertension and Parkinson's disease. EXAM: CT ANGIOGRAPHY CHEST WITH CONTRAST TECHNIQUE: Multidetector CT imaging of the chest was performed using the standard protocol during bolus administration of intravenous contrast. Multiplanar CT image reconstructions and MIPs were obtained to evaluate the vascular anatomy. RADIATION DOSE REDUCTION: This exam was performed according to the departmental dose-optimization program which includes automated exposure control, adjustment of the mA and/or kV according to patient size and/or use of iterative reconstruction technique. CONTRAST:  50m OMNIPAQUE IOHEXOL 350 MG/ML SOLN COMPARISON:  Chest radiographs 02/04/2022 and 01/31/2022. Cardiac CT 07/04/2020. Abdominal CT 09/08/2018. FINDINGS: Cardiovascular: The pulmonary arteries are well opacified with contrast to the level of the subsegmental branches. There is no evidence of acute pulmonary embolism. Atherosclerosis of the aorta, great vessels and coronary arteries. Although opacification of the thoracic aorta is limited, there is no evidence of acute systemic arterial abnormality. Aortic valvular calcifications are noted. The heart is mildly enlarged and there is  minimal pericardial fluid. Mediastinum/Nodes: Mildly prominent right hilar and subcarinal lymph nodes are likely  reactive. No other enlarged thoracic lymph nodes demonstrated. The thyroid gland, trachea and esophagus demonstrate no significant findings. Lungs/Pleura: Small right-greater-than-left pleural effusions. As seen on yesterday CT, there are new ground-glass opacities throughout both upper lobes, worse on the right. Mild dependent pulmonary opacities bilaterally likely represent atelectasis. There is evidence of mild underlying centrilobular and paraseptal emphysema. Scattered small calcified granulomas are noted. Upper abdomen: No acute findings are seen in the visualized upper abdomen. Innumerable renal lesions are present bilaterally, some of which measure higher than water density, likely reflecting cysts of varying complexity. These are similar to previous abdominal CT. Musculoskeletal/Chest wall: There is no chest wall mass or suspicious osseous finding. There are posttraumatic deformities of the upper sternal body and the T7 vertebral body. There is an old left-sided rib fracture. Review of the MIP images confirms the above findings. IMPRESSION: 1. No evidence of acute pulmonary embolism or other acute vascular findings in the chest. 2. New ground-glass opacities throughout both upper lobes, worse on the right, suspicious for atypical pneumonia. Recommend chest radiographic follow-up. 3. Small right-greater-than-left pleural effusions with mild dependent atelectasis bilaterally. 4. Mildly prominent right hilar and subcarinal lymph nodes, likely reactive. 5. Aortic Atherosclerosis (ICD10-I70.0) and Emphysema (ICD10-J43.9). Electronically Signed   By: Richardean Sale M.D.   On: 02/05/2022 18:23   DG Chest Port 1 View  Result Date: 02/04/2022 CLINICAL DATA:  Abnormal lung sounds EXAM: PORTABLE CHEST 1 VIEW COMPARISON:  Chest x-ray dated January 31, 2022 FINDINGS: The heart size and mediastinal  contours are within normal limits. New bilateral upper lobe heterogeneous opacities. The visualized skeletal structures are unremarkable. IMPRESSION: New bilateral upper lung heterogeneous opacities, likely due to infection or pulmonary edema. Electronically Signed   By: Yetta Glassman M.D.   On: 02/04/2022 10:57      LOS: 6 days     Richarda Osmond, DO [facility] 02/06/2022, 7:28 AM   Staff may message me via secure chat in [place]  but this may not receive an immediate response,  please page me for urgent matters!  If 7PM-7AM, please contact night coverage [email or web address]

## 2022-02-06 NOTE — TOC Progression Note (Signed)
Transition of Care [facility]) - Progression Note    Patient Details  Name: ARDA KEADLE MRN: [id number] Date of Birth: 08-07-1934  Transition of Care [facility]) CM/SW [place], RN Phone Number: 02/06/2022, 4:17 PM  Clinical Narrative:    Vonzella Nipple the patient's daughter and POA, left a general VM asking for a call back, the patient has bed offers to review   Expected Discharge Plan: [place] Barriers to Discharge: Continued Medical Work up  Expected Discharge Plan and Services       Living arrangements for the past 2 months: Single Family Home                                       Social Determinants of Health (SDOH) Interventions SDOH Screenings   Food Insecurity: No Food Insecurity (10/05/2021)  Housing: Low Risk  (10/05/2021)  Transportation Needs: No Transportation Needs (10/05/2021)  Utilities: Not At Risk (10/05/2021)  Alcohol Screen: Low Risk  (10/05/2021)  Depression (PHQ2-9): Low Risk  (10/05/2021)  Financial Resource Strain: Low Risk  (10/05/2021)  Physical Activity: Inactive (10/05/2021)  Social Connections: Moderately Isolated (10/05/2021)  Stress: No Stress Concern Present (10/05/2021)  Tobacco Use: Medium Risk (02/05/2022)    Readmission Risk Interventions    10/11/2019    9:10 AM  Readmission Risk Prevention Plan  Transportation Screening Complete  PCP or Specialist Appt within 3-5 Days Complete  HRI or [place] Complete  Social Work Consult for [place] Planning/Counseling Complete  Palliative Care Screening Not Applicable  Medication Review Press photographer) Complete

## 2022-02-06 NOTE — Progress Notes (Addendum)
Initial Nutrition Assessment  DOCUMENTATION CODES:   Non-severe (moderate) malnutrition in context of chronic illness  INTERVENTION:   -Ensure Enlive po TID, each supplement provides 350 kcal and 20 grams of protein.  -MVI with minerals daily -Feeding assistance with meals  NUTRITION DIAGNOSIS:   Moderate Malnutrition related to chronic illness (Parkinson's) as evidenced by mild fat depletion, moderate fat depletion, mild muscle depletion, moderate muscle depletion.  GOAL:   Patient will meet greater than or equal to 90% of their needs  MONITOR:   PO intake, Supplement acceptance  REASON FOR ASSESSMENT:   Consult Assessment of nutrition requirement/status  ASSESSMENT:   Pt with history of lewy body dementia, hypertension, depression, insomnia, Parkinson's, who presents for chief concerns of confusion.  Pt admitted with sepsis and influenza A.   1/30- downgraded to dysphagia 3 diet by SLP  Reviewed I/O's: +76 ml x 24 hours and -3 L since admission  UOP: 1.2 L x 24 hours  Spoke with pt at beside. Pt with confusion and history provided by grandson at bedside, who care for pt at home. Per grandson, pt typically has a very good appetite at home. He typically consumes 3 meals per day (Breakfast: grits and eggs, Lunch: sandwich, Dinner: meat, starch, and vegetable). Pt has multiple snacks throughout the day and has been trying to reduce his soda intake. Pt is usually with good mental status, but comes "more short" as the day progresses. Pt able to feed himself PTA even with tremors present. Pt currently unable to feed himself despite encouragement from grandson and RD due to upper extremity weakness. Pt grandson appreciative of softer foods, stating pt with multiple missing teeth that are to be replaced soon.   Reviewed wt hx; no wt loss noted.   Per grandson, he is requesting pt's favorite food items on his trays "to make this feel more like hom". Pt has been drinking well and  amenable to supplements and feeding assistance. Family has been unable to visit pt secondary to sickness.   Pt complaining of pain in his legs; notified RN. Per discussion with RN pt is more alert today compared to yesterday.   Medications reviewed and include vitamin C, sinemet, vitamin D3, and potassium chloride.   Labs reviewed: Na: 134, K: 3.3, Phos: 2.2.    NUTRITION - FOCUSED PHYSICAL EXAM:  Flowsheet Row Most Recent Value  Orbital Region Mild depletion  Upper Arm Region Mild depletion  Thoracic and Lumbar Region Mild depletion  Buccal Region Mild depletion  Temple Region Moderate depletion  Clavicle Bone Region Mild depletion  Clavicle and Acromion Bone Region Mild depletion  Scapular Bone Region Mild depletion  Dorsal Hand Mild depletion  Patellar Region No depletion  Anterior Thigh Region No depletion  Posterior Calf Region No depletion  Edema (RD Assessment) Mild  Hair Reviewed  Eyes Reviewed  Mouth Reviewed  Skin Reviewed  Nails Reviewed       Diet Order:   Diet Order             DIET DYS 3 Room service appropriate? Yes with Assist; Fluid consistency: Thin  Diet effective now                   EDUCATION NEEDS:   Education needs have been addressed  Skin:  Skin Assessment: Reviewed RN Assessment  Last BM:  02/05/22  Height:   Ht Readings from Last 1 Encounters:  02/01/22 6' (1.829 m)    Weight:   Wt Readings from  Last 1 Encounters:  02/01/22 95.5 kg    Ideal Body Weight:  80.9 kg  BMI:  Body mass index is 28.55 kg/m.  Estimated Nutritional Needs:   Kcal:  2200-2400  Protein:  105-120 grams  Fluid:  > 2 L    Loistine Chance, RD, LDN, [place] Registered Dietitian II Certified Diabetes Care and Education Specialist Please refer to [facility] for RD and/or RD on-call/weekend/after hours pager

## 2022-02-06 NOTE — Progress Notes (Signed)
PHARMACIST - PHYSICIAN COMMUNICATION DR:   Ouida Sills CONCERNING: Antibiotic IV to Oral Route Change Policy  RECOMMENDATION: This patient is receiving azithromycin by the intravenous route.  Based on criteria approved by the Pharmacy and Therapeutics Committee, the antibiotic(s) is/are being converted to the equivalent oral dose form(s).   DESCRIPTION: These criteria include: Patient being treated for a respiratory tract infection, urinary tract infection, cellulitis or clostridium difficile associated diarrhea if on metronidazole The patient is not neutropenic and does not exhibit a GI malabsorption state The patient is eating (either orally or via tube) and/or has been taking other orally administered medications for a least 24 hours The patient is improving clinically and has a Tmax < 100.5  If you have questions about this conversion, please contact the Pharmacy Department  '[x]'$   [phone] )  [facility]

## 2022-02-06 NOTE — Progress Notes (Signed)
Patient is alert and oriented to person and place. Pleasant but will not stay in bed. Removes oxygen frequently causing his to desaturate into the 80s. Oxygen increases to normal once oxygen is reapplied. Patient has been oriented several times regarding oxygen but he continues to remove it. Patient moves frequently in bed. He complained of L-hip burning-gave prn pain med and suggested that patient rotates off of his L-hip. Patient refused mobility. Educated him that he should rotate off of his L-hip to prevent skin breakdown. Encouraged him to get onto his right side. Patient denied additional needs.

## 2022-02-06 NOTE — Progress Notes (Signed)
Physical Therapy Treatment Patient Details Name: Randy [name] MRN: [id number] DOB: 1934-09-17 Today's Date: 02/06/2022   History of Present Illness presented to ER secondary to AMS, confusion; admitted for management of sepsis related to influenza, possible CAP (R UL)    PT Comments    Pt was long sitting in bed watching TV upon arriving. He agrees to session and remains cooperative throughout. Cognition deficits are present but pt pleasantly confused. He does present with anxiety with all movements. Performed several exercises in bed prior to getting OOB. Pt required max assist to safely exit bed + max assist to stand 1 x. Severe posterior push noted. Recommend +2 assistance for all future OOB activity. Acute PT will continue to follow and progress as able per current POC.    Recommendations for follow up therapy are one component of a multi-disciplinary discharge planning process, led by the attending physician.  Recommendations may be updated based on patient status, additional functional criteria and insurance authorization.  Follow Up Recommendations  Skilled nursing-short term rehab (<3 hours/day)     Assistance Recommended at Discharge Frequent or constant Supervision/Assistance  Patient can return home with the following Two people to help with walking and/or transfers;Two people to help with bathing/dressing/bathroom;Help with stairs or ramp for entrance;Direct supervision/assist for financial management;Direct supervision/assist for medications management;Assistance with cooking/housework   Equipment Recommendations  Other (comment) (defer to next level of care)       Precautions / Restrictions Precautions Precautions: Fall Restrictions Weight Bearing Restrictions: No     Mobility  Bed Mobility Overal bed mobility: Needs Assistance Bed Mobility: Supine to Sit  Supine to sit: Max assist Sit to supine: Max assist General bed mobility comments: max assist required to  safely exit R side of bed. Max assist to return to supine form EOB sitting. pt's anxiety greatly limits session progression    Transfers Overall transfer level: Needs assistance Equipment used: Rolling walker (2 wheels) Transfers: Sit to/from Stand Sit to Stand: From elevated surface, Max assist    General transfer comment: Max assist required to stand from elevated bed height. Pt has severe anxiety and severe posterior push with standing.Only tolerated standing ~ 10 sec prior to requeting to sit.    Ambulation/Gait  General Gait Details: unsafe to advance away form EOB due to weakness,posterior push, and anxiety    Balance Overall balance assessment: Needs assistance Sitting-balance support: No upper extremity supported, Feet supported Sitting balance-Leahy Scale: Fair Sitting balance - Comments: Pt required extensive assistanc to achieve EOB sitting but one seated EOB with feet support. SBA only   Standing balance support: Bilateral upper extremity supported Standing balance-Leahy Scale: Zero Standing balance comment: Max assist + max vcs to safely stand EOB 1 x      Cognition Arousal/Alertness: Awake/alert Behavior During Therapy: Anxious Overall Cognitive Status: History of cognitive impairments - at baseline      General Comments: Oriented to self only; pleasant and cooperative, but limited ability to follow verbal commands without hand-over-hand/act assist for all activities           General Comments General comments (skin integrity, edema, etc.): Pt performed several supine exercises in bed prior to attempting OOB activity. Pt tolerated exercises well but needs vc + tcs for correct performance of desired task requested of him.      Pertinent Vitals/Pain Pain Assessment Pain Assessment: 0-10 Pain Score: 8  Pain Location: LBP/sacral region Pain Descriptors / Indicators: Aching, Guarding, Grimacing Pain Intervention(s): Limited activity within patient's tolerance,  Monitored during session, Premedicated before session, Repositioned     PT Goals (current goals can now be found in the care plan section) Acute Rehab PT Goals Patient Stated Goal: none stated Progress towards PT goals: Progressing toward goals    Frequency    Min 2X/week      PT Plan Current plan remains appropriate       AM-PAC PT "6 Clicks" Mobility   Outcome Measure  Help needed turning from your back to your side while in a flat bed without using bedrails?: A Lot Help needed moving from lying on your back to sitting on the side of a flat bed without using bedrails?: A Lot Help needed moving to and from a bed to a chair (including a wheelchair)?: A Lot Help needed standing up from a chair using your arms (e.g., wheelchair or bedside chair)?: A Lot Help needed to walk in hospital room?: Total Help needed climbing 3-5 steps with a railing? : Total 6 Click Score: 10    End of Session Equipment Utilized During Treatment: Gait belt Activity Tolerance: Patient tolerated treatment well;Patient limited by fatigue;Other (comment) (anxiety with movements limiting) Patient left: in bed;with call bell/phone within reach;with bed alarm set Nurse Communication: Mobility status PT Visit Diagnosis: Muscle weakness (generalized) (M62.81);Difficulty in walking, not elsewhere classified (R26.2)     Time: 1610-9604 PT Time Calculation (min) (ACUTE ONLY): 18 min  Charges:  $Therapeutic Activity: 8-22 mins                     Julaine Fusi PTA 02/06/22, 5:15 PM

## 2022-02-06 NOTE — Plan of Care (Signed)

## 2022-02-07 DIAGNOSIS — J9601 Acute respiratory failure with hypoxia: Secondary | ICD-10-CM | POA: Diagnosis not present

## 2022-02-07 DIAGNOSIS — E440 Moderate protein-calorie malnutrition: Secondary | ICD-10-CM | POA: Insufficient documentation

## 2022-02-07 DIAGNOSIS — R4182 Altered mental status, unspecified: Secondary | ICD-10-CM | POA: Diagnosis not present

## 2022-02-07 DIAGNOSIS — R6510 Systemic inflammatory response syndrome (SIRS) of non-infectious origin without acute organ dysfunction: Secondary | ICD-10-CM | POA: Diagnosis not present

## 2022-02-07 DIAGNOSIS — J101 Influenza due to other identified influenza virus with other respiratory manifestations: Secondary | ICD-10-CM | POA: Diagnosis not present

## 2022-02-07 LAB — BASIC METABOLIC PANEL
Anion gap: 9 (ref 5–15)
BUN: 25 mg/dL — ABNORMAL HIGH (ref 8–23)
CO2: 30 mmol/L (ref 22–32)
Calcium: 8.1 mg/dL — ABNORMAL LOW (ref 8.9–10.3)
Chloride: 97 mmol/L — ABNORMAL LOW (ref 98–111)
Creatinine, Ser: 0.79 mg/dL (ref 0.61–1.24)
GFR, Estimated: >60 mL/min (ref >60)
Glucose, Bld: 108 mg/dL — ABNORMAL HIGH (ref 70–99)
Potassium: 4.3 mmol/L (ref 3.5–5.1)
Sodium: 136 mmol/L (ref 135–145)

## 2022-02-07 LAB — PROCALCITONIN: Procalcitonin: <0.1 ng/mL

## 2022-02-07 NOTE — TOC Progression Note (Signed)
Transition of Care [facility]) - Progression Note    Patient Details  Name: Randy [name] MRN: [id number] Date of Birth: 1934-09-06  Transition of Care [facility]) CM/SW [place], RN Phone Number: 02/07/2022, 10:05 AM  Clinical Narrative:  Daughter [name] called back and we reviewed the bed options I provided  addresses and Medicare.gov website She is going to drive to the facilities and then look at the medicare website and call me back with choice, I explained I needed the choice early today so that we can gt the Ins process started to go to the facility     Expected Discharge Plan: [place] Barriers to Discharge: Continued Medical Work up  Expected Discharge Plan and Services       Living arrangements for the past 2 months: Single Family Home                                       Social Determinants of Health (SDOH) Interventions SDOH Screenings   Food Insecurity: No Food Insecurity (10/05/2021)  Housing: Low Risk  (10/05/2021)  Transportation Needs: No Transportation Needs (10/05/2021)  Utilities: Not At Risk (10/05/2021)  Alcohol Screen: Low Risk  (10/05/2021)  Depression (PHQ2-9): Low Risk  (10/05/2021)  Financial Resource Strain: Low Risk  (10/05/2021)  Physical Activity: Inactive (10/05/2021)  Social Connections: Moderately Isolated (10/05/2021)  Stress: No Stress Concern Present (10/05/2021)  Tobacco Use: Medium Risk (02/05/2022)    Readmission Risk Interventions    10/11/2019    9:10 AM  Readmission Risk Prevention Plan  Transportation Screening Complete  PCP or Specialist Appt within 3-5 Days Complete  HRI or [place] Complete  Social Work Consult for [place] Planning/Counseling Complete  Palliative Care Screening Not Applicable  Medication Review [employer]) Complete

## 2022-02-07 NOTE — Plan of Care (Signed)
  Problem: Fluid Volume: Goal: Hemodynamic stability will improve Outcome: Progressing   Problem: Respiratory: Goal: Ability to maintain adequate ventilation will improve Outcome: Progressing   Problem: Health Behavior/Discharge Planning: Goal: Ability to manage health-related needs will improve Outcome: Progressing   Problem: Clinical Measurements: Goal: Ability to maintain clinical measurements within normal limits will improve Outcome: Progressing Goal: Will remain free from infection Outcome: Progressing Goal: Diagnostic test results will improve Outcome: Progressing Goal: Respiratory complications will improve Outcome: Progressing Goal: Cardiovascular complication will be avoided Outcome: Progressing   Problem: Nutrition: Goal: Adequate nutrition will be maintained Outcome: Progressing   Problem: Coping: Goal: Level of anxiety will decrease Outcome: Progressing   Problem: Elimination: Goal: Will not experience complications related to bowel motility Outcome: Progressing Goal: Will not experience complications related to urinary retention Outcome: Progressing   Problem: Safety: Goal: Ability to remain free from injury will improve Outcome: Progressing   Problem: Skin Integrity: Goal: Risk for impaired skin integrity will decrease Outcome: Progressing

## 2022-02-07 NOTE — TOC Progression Note (Addendum)
Transition of Care [facility]) - Progression Note    Patient Details  Name: Randy [name] MRN: [id number] Date of Birth: [birth year]  Transition of Care [facility]) CM/SW [place], RN Phone Number: 02/07/2022, 1:26 PM  Clinical Narrative:    Damaris Schooner to [name], She talked to [facility] and accepted the bed offer, I explained we would get ins approval Spoke to [place] at [facility] and she confirmed the bed will be available once we get ins approval   Expected Discharge Plan: [place] Barriers to Discharge: Continued Medical Work up  Expected Discharge Plan and [place] arrangements for the past 2 months: Single Family Home                                       Social Determinants of Health (SDOH) Interventions SDOH Screenings   Food Insecurity: No Food Insecurity (10/05/2021)  Housing: Low Risk  (10/05/2021)  Transportation Needs: No Transportation Needs (10/05/2021)  Utilities: Not At Risk (10/05/2021)  Alcohol Screen: Low Risk  (10/05/2021)  Depression (PHQ2-9): Low Risk  (10/05/2021)  Financial Resource Strain: Low Risk  (10/05/2021)  Physical Activity: Inactive (10/05/2021)  Social Connections: Moderately Isolated (10/05/2021)  Stress: No Stress Concern Present (10/05/2021)  Tobacco Use: Medium Risk (02/05/2022)    Readmission Risk Interventions    10/11/2019    9:10 AM  Readmission Risk Prevention Plan  Transportation Screening Complete  PCP or Specialist Appt within 3-5 Days Complete  HRI or [place] Complete  Social Work Consult for [place] Planning/Counseling Complete  Palliative Care Screening Not Applicable  Medication Review Press photographer) Complete

## 2022-02-07 NOTE — Progress Notes (Signed)
Physical Therapy Treatment Patient Details Name: Randy [name] MRN: [id number] DOB: [birth year] Today's Date: 02/07/2022   History of Present Illness presented to ER secondary to AMS, confusion; admitted for management of sepsis related to influenza, possible CAP (R UL)    PT Comments    Pt was long sitting in bed upon arriving. He is alert to self only but pleasant and cooperative. Does get anxious with all mobility, transfers, and during gait short distances. Pt performed rolling L to short sit with increased time+ max vcs + max assist. Achieve EOB sitting with max assist. He stood EOB 5 x throughout session with overall less assistance however +2 for safety. Pt presents with very anxiety + posterior push in standing. Required author to progress RW when taking a few steps from FOB to [facility] prior to then standing and taking a few steps from EOB to recliner. Pt very fearful of falling but fully participates throughout. Pt will require rehab at DC to maximize his independence while decreasing caregiver burden. Acute PT will continue to follow per current POC.    Recommendations for follow up therapy are one component of a multi-disciplinary discharge planning process, led by the attending physician.  Recommendations may be updated based on patient status, additional functional criteria and insurance authorization.  Follow Up Recommendations  Skilled nursing-short term rehab (<3 hours/day)     Assistance Recommended at Discharge Frequent or constant Supervision/Assistance  Patient can return home with the following Two people to help with walking and/or transfers;Two people to help with bathing/dressing/bathroom;Help with stairs or ramp for entrance;Direct supervision/assist for financial management;Direct supervision/assist for medications management;Assistance with cooking/housework   Equipment Recommendations  Other (comment) (defer to next level of care)       Precautions / Restrictions  Precautions Precautions: Fall Restrictions Weight Bearing Restrictions: No     Mobility  Bed Mobility Overal bed mobility: Needs Assistance Bed Mobility: Supine to Sit, Sit to Supine  Supine to sit: Max assist  General bed mobility comments: Pt required extensive assistance to roll L to short sit. Pt very anxious with all movements. constant vcs for relaxation techniques.    Transfers Overall transfer level: Needs assistance Equipment used: Rolling walker (2 wheels) Transfers: Sit to/from Stand Sit to Stand: +2 safety/equipment, From elevated surface, Mod assist Stand pivot transfers: Max assist      General transfer comment: Pt was able to stand ~ 5 x EOB with mod assist opf one with 2nd person close SBA for additional safety. pt has severe posterior push. needs assistance + vcs for fwd wt shift. limited standing tolerance due to anxiety. stood ~ 20 sec each trial. pt's feet tend to slide forward due to posterior push. remains anxious with all movements    Ambulation/Gait Ambulation/Gait assistance: Max assist Gait Distance (Feet): 3 Feet Assistive device: Rolling walker (2 wheels) Gait Pattern/deviations: Step-to pattern Gait velocity: decreased     General Gait Details: Pt was able to take ~ 4 steps form FOB to [facility] prior to standing and taking ~ 3 more steps to recliner. Requires max assist for advancement of RW and max vcs for sequencing, relaxation, and technique improvements. Author has to progress RW for pt with all ambulation.    Balance Overall balance assessment: Needs assistance Sitting-balance support: Bilateral upper extremity supported, Feet supported Sitting balance-Leahy Scale: Fair     Standing balance support: Bilateral upper extremity supported Standing balance-Leahy Scale: Poor Standing balance comment: pt's anxiety and severe posterior push makes pt at high  risk of falls       Cognition Arousal/Alertness: Awake/alert Behavior During Therapy:  Anxious Overall Cognitive Status: History of cognitive impairments - at baseline    General Comments: Oriented to self only; pleasant and cooperative, but limited ability to follow verbal commands without hand-over-hand/act assist for all activities               Pertinent Vitals/Pain Pain Assessment Pain Assessment: PAINAD Breathing: occasional labored breathing, short period of hyperventilation Negative Vocalization: occasional moan/groan, low speech, negative/disapproving quality Facial Expression: sad, frightened, frown Body Language: tense, distressed pacing, fidgeting Consolability: distracted or reassured by voice/touch PAINAD Score: 5 Pain Location: LBP/sacral region Pain Descriptors / Indicators: Aching, Guarding, Grimacing Pain Intervention(s): Limited activity within patient's tolerance, Monitored during session, Premedicated before session, Repositioned     PT Goals (current goals can now be found in the care plan section) Acute Rehab PT Goals Patient Stated Goal: none stated Progress towards PT goals: Progressing toward goals    Frequency    Min 2X/week      PT Plan Current plan remains appropriate       AM-PAC PT "6 Clicks" Mobility   Outcome Measure  Help needed turning from your back to your side while in a flat bed without using bedrails?: A Lot Help needed moving from lying on your back to sitting on the side of a flat bed without using bedrails?: A Lot Help needed moving to and from a bed to a chair (including a wheelchair)?: A Lot Help needed standing up from a chair using your arms (e.g., wheelchair or bedside chair)?: A Lot Help needed to walk in hospital room?: Total Help needed climbing 3-5 steps with a railing? : Total 6 Click Score: 10    End of Session   Activity Tolerance: Other (comment);Patient limited by fatigue Patient left: in chair;with call bell/phone within reach;with chair alarm set;with nursing/sitter in room Nurse  Communication: Mobility status PT Visit Diagnosis: Muscle weakness (generalized) (M62.81);Difficulty in walking, not elsewhere classified (R26.2)     Time: 1010-1034 PT Time Calculation (min) (ACUTE ONLY): 24 min  Charges:  $Therapeutic Activity: 23-37 mins                     Julaine Fusi PTA 02/07/22, 10:54 AM

## 2022-02-07 NOTE — Progress Notes (Signed)
PROGRESS NOTE  Randy [name]   IWP:[id number] DOB: 09-09-1934  DOA: 01/31/2022 Date of Service: 02/07/22 PCP: [name], DO  Brief Narrative / Hospital Course:  [name] is a 88 year old male with history of lewy body dementia, hypertension, depression, insomnia, Parkinson's, who presents emergency room for chief concerns of confusion. Onset 5-6 days ago per family w. AMS/confusion, brief improvement then worsening again and family called EMS. Per ED report, EMS found patient with SpO2 of 88% on room air, patient was placed on 2 L nasal cannula with improvement. 01/25: In ED, febrile 101.1, tachypneic alternating w/ normal RR< BP stable, SpO2 96% 4L [place]. Lactic acid is 0.8.  CXR possible RUL PNA. (+)influenza A by PCR. Blood cultures x 2 in process. In ED received Azithromycin 500 mg IV every 24 hours, 5 days ordered; ceftriaxone 2 g IV every 24 hours, 5 days ordered.  Sodium chloride 1 L bolus.  LR infusion at 150 mL/h. Admitted to hospitalist service. Outside window for antiviral benefit. Procal and BCx pending.  01/26: Procalcitonin <0.10. Still on 4L [place] O2 and tachypneic in AM though RR improving into the afternoon. Confused a bit above baseline per daughter.  01/27: Still confused. On 3-5L [place] O2, slight rales, will trial Lasix po x1 today given hx G1DD 01/28: rales improved, still on 3L O2, low K replaced, low Na w/ poor po intake will hold off on restarting fluids  01/29: rales resolved on exam. Still high O2 requirement. CXR ordered ?new infiltrate, restarted abx and diuresis 01/30: still high O2 requirement at 4L, continue abx, trend procalcitonin and BNP --> no concerns on either. CTA neg PE but (+)groundglass bilateral opacity concern for atypical PNA.  1/31-2/1: slight improvement to 3L O2 [place]. Continuing IV Abx course  Consultants:  none  Procedures: none  ASSESSMENT & PLAN:   Principal Problem:   SIRS (systemic inflammatory response syndrome) (HCC) Active  Problems:   Altered mental status   Parkinsonism   Lewy body dementia without behavioral disturbance (HCC)   Essential hypertension   GERD (gastroesophageal reflux disease)   Influenza A   Grade 1 diastolic dysfunction  Sepsis d/t Influenza A, 2/2 CAP RUL Patient had elevated heart rate and resp rate, Tmax documented in the hospital at 101.1  Acute hypoxic respiratory failure- improving slowly. Currently on 3L [place] Influenza +  CAP New bilateral upper lung heterogeneous opacities on chest xray and confirmed with CTA Continue azithro and ceftriaxone (also received dose in ED but d/c d/t most likely viral) - procalcitonin negative, however CT concerning for atypical PNA. Await sputum cx, MRSA screen but no significant MRSA/Pseudomonas risk factors.  Wean to room air PRN Breathing treatments PRN  Influenza A-Patient was outside window for antiviral benefits Continue  DuoNebs  Supportive care PRN  Grade 1 diastolic dysfunction HF- euvolemic to mildly hypervolemic on exam with mild rhales in bases Monitor fluid status and O2 requirement  - lasix PRN  Essential hypertension Enalapril 5 mg daily resumed for 02/01/2022  GERD (gastroesophageal reflux disease) PPI  Lewy body dementia without behavioral disturbance (HCC) Parkinsonism Rivastigmine 3 mg p.o. 3 times daily Carbidopa-levodopa 50-200 mg p.o. 4 times daily  Mild hypokalemia- likely 2/2 poor oral intake - monitor and replete PRN - RD consult  DVT prophylaxis: lovenox  Pertinent IV fluids/nutrition: no continuous IV fluids  Central lines / invasive devices: none  Code Status: FULL CODE confirmed over the phone w/ daughter 02/01/22 2:13 PM by Dr Sheppard Coil   Current  Admission Status: inpatient, med tele TOC needs / Dispo plan: discussing SNF w/ daughter  Barriers to discharge / significant pending items: O2 requirement, SNF placement, IV Abx  Subjective / Brief ROS:  Patient had presumably accidentally removed his [place]  prior to our encounter. Upon my arrival, he was alert with no apparent respiratory distress. He has hypoxic delirium and was confused and disoriented. This improved with replacing his [place]. Nurse notified.    Family Communication: none at bedside. Will update with changes  Objective Findings:  Vitals:   02/06/22 0756 02/06/22 0815 02/06/22 1632 02/07/22 0030  BP:  135/80 119/75 (!) 117/92  Pulse:  71 70 82  Resp:  '16 16 16  '$ Temp:  97.9 F (36.6 C) 98 F (36.7 C) 98.5 F (36.9 C)  TempSrc:      SpO2: 97% 94% 95% 95%  Weight:      Height:        Intake/Output Summary (Last 24 hours) at 02/07/2022 0719 Last data filed at 02/07/2022 0600 Gross per 24 hour  Intake --  Output 1300 ml  Net -1300 ml    Filed Weights   01/31/22 1024 02/01/22 1542  Weight: 96.8 kg 95.5 kg    Examination:  Physical Exam Constitutional:      General: He is not in acute distress. Cardiovascular:     Rate and Rhythm: Normal rate and regular rhythm.     Heart sounds: Murmur heard.  Pulmonary:     Effort: No respiratory distress.     Breath sounds: Rales (at bases) present. No wheezing (minimal).  Abdominal:     General: Abdomen is flat.     Palpations: Abdomen is soft.  Musculoskeletal:        General: No swelling or deformity.     Cervical back: Normal range of motion.  Skin:    General: Skin is warm and dry.  Neurological:     Mental Status: He is alert. He is disoriented.     Cranial Nerves: No cranial nerve deficit.     Coordination: Coordination normal.  Psychiatric:        Mood and Affect: Mood normal.    Scheduled Medications:   ascorbic acid  500 mg Oral Daily   azithromycin  500 mg Oral Daily   carbidopa-levodopa  1 tablet Oral QID   cholecalciferol  1,000 Units Oral Daily   citric acid-potassium citrate  20 mEq Oral QHS   enalapril  5 mg Oral Daily   enoxaparin (LOVENOX) injection  40 mg Subcutaneous Q24H   feeding supplement  237 mL Oral TID BM   ipratropium-albuterol  3 mL  Nebulization BID   multivitamin with minerals  1 tablet Oral Daily   pantoprazole  40 mg Oral Daily   rivastigmine  3 mg Oral BID   sertraline  50 mg Oral Daily    Continuous Infusions:  cefTRIAXone (ROCEPHIN)  IV 2 g (02/06/22 1634)   PRN Medications:  ALPRAZolam, chlorpheniramine-HYDROcodone, diclofenac Sodium, haloperidol lactate, HYDROcodone-acetaminophen, melatonin  Antimicrobials from admission:  Anti-infectives (From admission, onward)    Start     Dose/Rate Route Frequency Ordered Stop   02/06/22 1500  azithromycin (ZITHROMAX) tablet 500 mg        500 mg Oral Daily 02/06/22 1328     02/04/22 1800  cefTRIAXone (ROCEPHIN) 2 g in sodium chloride 0.9 % 100 mL IVPB        2 g 200 mL/hr over 30 Minutes Intravenous Every 24 hours 02/04/22 1545  02/04/22 1700  azithromycin (ZITHROMAX) 500 mg in sodium chloride 0.9 % 250 mL IVPB  Status:  Discontinued        500 mg 250 mL/hr over 60 Minutes Intravenous Every 24 hours 02/04/22 1545 02/06/22 1327   01/31/22 1030  cefTRIAXone (ROCEPHIN) 2 g in sodium chloride 0.9 % 100 mL IVPB  Status:  Discontinued        2 g 200 mL/hr over 30 Minutes Intravenous Every 24 hours 01/31/22 1028 01/31/22 1747   01/31/22 1030  azithromycin (ZITHROMAX) 500 mg in sodium chloride 0.9 % 250 mL IVPB  Status:  Discontinued        500 mg 250 mL/hr over 60 Minutes Intravenous Every 24 hours 01/31/22 1028 01/31/22 1747       Data Reviewed:  I have personally reviewed the following.  CBC: Recent Labs  Lab 01/31/22 1031 02/01/22 0549 02/06/22 0448  WBC 4.2 5.1 4.5  NEUTROABS 3.7  --   --   HGB 11.2* 10.7* 11.5*  HCT 34.1* 33.8* 34.4*  MCV 90.7 94.4 86.9  PLT 156 131* 767    Basic Metabolic Panel: Recent Labs  Lab 02/01/22 0549 02/03/22 0418 02/04/22 0203 02/05/22 0143 02/06/22 0448  NA 136 132* 135 131* 134*  K 3.5 3.1* 3.1* 3.1* 3.3*  CL 103 96* 94* 92* 95*  CO2 '23 29 29 29 31  '$ GLUCOSE 87 107* 101* 105* 117*  BUN '22 17 18 18 21   '$ CREATININE 0.76 0.76 0.77 0.71 0.74  CALCIUM 7.8* 7.6* 7.9* 7.7* 8.1*  MG 1.8  --   --   --   --     GFR: Estimated Creatinine Clearance: 79.5 mL/min (by C-G formula based on SCr of 0.74 mg/dL). Liver Function Tests: Recent Labs  Lab 01/31/22 1031  AST 17  ALT 6  ALKPHOS 72  BILITOT 1.3*  PROT 6.7  ALBUMIN 3.4*    Microbiology: Recent Results (from the past 240 hour(s))  Blood Culture (routine x 2)     Status: None   Collection Time: 01/31/22 10:30 AM   Specimen: BLOOD  Result Value Ref Range Status   Specimen Description BLOOD BLOOD LEFT FOREARM  Final   Special Requests   Final    BOTTLES DRAWN AEROBIC AND ANAEROBIC Blood Culture results may not be optimal due to an excessive volume of blood received in culture bottles   Culture   Final    NO GROWTH 5 DAYS Performed at [facility], [address] [place], [place] [address]    Report Status 02/05/2022 FINAL  Final  Resp panel by RT-PCR (RSV, Flu A&B, Covid) Anterior Nasal Swab     Status: Abnormal   Collection Time: 01/31/22 10:31 AM   Specimen: Anterior Nasal Swab  Result Value Ref Range Status   SARS Coronavirus 2 by RT PCR NEGATIVE NEGATIVE Final    Comment: (NOTE) SARS-CoV-2 target nucleic acids are NOT DETECTED.  The SARS-CoV-2 RNA is generally detectable in upper respiratory specimens during the acute phase of infection. The lowest concentration of SARS-CoV-2 viral copies this assay can detect is 138 copies/mL. A negative result does not preclude SARS-Cov-2 infection and should not be used as the sole basis for treatment or other patient management decisions. A negative result may occur with  improper specimen collection/handling, submission of specimen other than nasopharyngeal swab, presence of viral mutation(s) within the areas targeted by this assay, and inadequate number of viral copies(<138 copies/mL). A negative result must be combined with clinical  observations, patient history, and  epidemiological information. The expected result is Negative.  Fact Sheet for Patients:  EntrepreneurPulse.com.au  Fact Sheet for Healthcare Providers:  IncredibleEmployment.be  This test is no t yet approved or cleared by the [place] FDA and  has been authorized for detection and/or diagnosis of SARS-CoV-2 by FDA under an Emergency Use Authorization (EUA). This EUA will remain  in effect (meaning this test can be used) for the duration of the COVID-19 declaration under Section 564(b)(1) of the Act, 21 U.S.C.section 360bbb-3(b)(1), unless the authorization is terminated  or revoked sooner.       Influenza A by PCR POSITIVE (A) NEGATIVE Final   Influenza B by PCR NEGATIVE NEGATIVE Final    Comment: (NOTE) The Xpert Xpress SARS-CoV-2/FLU/RSV plus assay is intended as an aid in the diagnosis of influenza from Nasopharyngeal swab specimens and should not be used as a sole basis for treatment. Nasal washings and aspirates are unacceptable for Xpert Xpress SARS-CoV-2/FLU/RSV testing.  Fact Sheet for Patients: EntrepreneurPulse.com.au  Fact Sheet for Healthcare Providers: IncredibleEmployment.be  This test is not yet approved or cleared by the [place] FDA and has been authorized for detection and/or diagnosis of SARS-CoV-2 by FDA under an Emergency Use Authorization (EUA). This EUA will remain in effect (meaning this test can be used) for the duration of the COVID-19 declaration under Section 564(b)(1) of the Act, 21 U.S.C. section 360bbb-3(b)(1), unless the authorization is terminated or revoked.     Resp Syncytial Virus by PCR NEGATIVE NEGATIVE Final    Comment: (NOTE) Fact Sheet for Patients: EntrepreneurPulse.com.au  Fact Sheet for Healthcare Providers: IncredibleEmployment.be  This test is not yet approved or cleared by the [place] FDA and has  been authorized for detection and/or diagnosis of SARS-CoV-2 by FDA under an Emergency Use Authorization (EUA). This EUA will remain in effect (meaning this test can be used) for the duration of the COVID-19 declaration under Section 564(b)(1) of the Act, 21 U.S.C. section 360bbb-3(b)(1), unless the authorization is terminated or revoked.  Performed at [facility], [address] [place], [place] [address]   Blood Culture (routine x 2)     Status: None   Collection Time: 01/31/22 10:31 AM   Specimen: BLOOD  Result Value Ref Range Status   Specimen Description BLOOD  RIGHT HAND  Final   Special Requests   Final    BOTTLES DRAWN AEROBIC AND ANAEROBIC Blood Culture adequate volume   Culture   Final    NO GROWTH 5 DAYS Performed at [facility], [address] [place], [place] [address]    Report Status 02/05/2022 FINAL  Final     Radiology Studies last 3 days: CT Angio Chest Pulmonary Embolism (PE) W or WO Contrast  Result Date: 02/05/2022 CLINICAL DATA:  Progressive confusion and hypoxemia. Clinical concern for pulmonary embolism, high probability. History of dementia, hypertension and Parkinson's disease. EXAM: CT ANGIOGRAPHY CHEST WITH CONTRAST TECHNIQUE: Multidetector CT imaging of the chest was performed using the standard protocol during bolus administration of intravenous contrast. Multiplanar CT image reconstructions and MIPs were obtained to evaluate the vascular anatomy. RADIATION DOSE REDUCTION: This exam was performed according to the departmental dose-optimization program which includes automated exposure control, adjustment of the mA and/or kV according to patient size and/or use of iterative reconstruction technique. CONTRAST:  40m OMNIPAQUE IOHEXOL 350 MG/ML SOLN COMPARISON:  Chest radiographs 02/04/2022 and 01/31/2022. Cardiac CT 07/04/2020. Abdominal CT 09/08/2018. FINDINGS: Cardiovascular: The pulmonary arteries are well opacified with  contrast to  the level of the subsegmental branches. There is no evidence of acute pulmonary embolism. Atherosclerosis of the aorta, great vessels and coronary arteries. Although opacification of the thoracic aorta is limited, there is no evidence of acute systemic arterial abnormality. Aortic valvular calcifications are noted. The heart is mildly enlarged and there is minimal pericardial fluid. Mediastinum/Nodes: Mildly prominent right hilar and subcarinal lymph nodes are likely reactive. No other enlarged thoracic lymph nodes demonstrated. The thyroid gland, trachea and esophagus demonstrate no significant findings. Lungs/Pleura: Small right-greater-than-left pleural effusions. As seen on yesterday CT, there are new ground-glass opacities throughout both upper lobes, worse on the right. Mild dependent pulmonary opacities bilaterally likely represent atelectasis. There is evidence of mild underlying centrilobular and paraseptal emphysema. Scattered small calcified granulomas are noted. Upper abdomen: No acute findings are seen in the visualized upper abdomen. Innumerable renal lesions are present bilaterally, some of which measure higher than water density, likely reflecting cysts of varying complexity. These are similar to previous abdominal CT. Musculoskeletal/Chest wall: There is no chest wall mass or suspicious osseous finding. There are posttraumatic deformities of the upper sternal body and the T7 vertebral body. There is an old left-sided rib fracture. Review of the MIP images confirms the above findings. IMPRESSION: 1. No evidence of acute pulmonary embolism or other acute vascular findings in the chest. 2. New ground-glass opacities throughout both upper lobes, worse on the right, suspicious for atypical pneumonia. Recommend chest radiographic follow-up. 3. Small right-greater-than-left pleural effusions with mild dependent atelectasis bilaterally. 4. Mildly prominent right hilar and subcarinal lymph nodes, likely  reactive. 5. Aortic Atherosclerosis (ICD10-I70.0) and Emphysema (ICD10-J43.9). Electronically Signed   By: Richardean Sale M.D.   On: 02/05/2022 18:23   DG Chest Port 1 View  Result Date: 02/04/2022 CLINICAL DATA:  Abnormal lung sounds EXAM: PORTABLE CHEST 1 VIEW COMPARISON:  Chest x-ray dated January 31, 2022 FINDINGS: The heart size and mediastinal contours are within normal limits. New bilateral upper lobe heterogeneous opacities. The visualized skeletal structures are unremarkable. IMPRESSION: New bilateral upper lung heterogeneous opacities, likely due to infection or pulmonary edema. Electronically Signed   By: Yetta Glassman M.D.   On: 02/04/2022 10:57      LOS: 7 days     Richarda Osmond, DO [facility] 02/07/2022, 7:19 AM   Staff may message me via secure chat in [place]  but this may not receive an immediate response,  please page me for urgent matters!  If 7PM-7AM, please contact night coverage [email or web address]

## 2022-02-08 ENCOUNTER — Encounter: Payer: Self-pay | Admitting: Osteopathic Medicine

## 2022-02-08 DIAGNOSIS — A419 Sepsis, unspecified organism: Secondary | ICD-10-CM

## 2022-02-08 DIAGNOSIS — J189 Pneumonia, unspecified organism: Secondary | ICD-10-CM | POA: Diagnosis present

## 2022-02-08 MED ORDER — HYDROCODONE-ACETAMINOPHEN 5-325 MG PO TABS: 1.0000 | ORAL_TABLET | Freq: Four times a day (QID) | ORAL | 0 refills | Status: DC | PRN

## 2022-02-08 NOTE — TOC Progression Note (Signed)
Transition of Care [facility]) - Progression Note    Patient Details  Name: Randy [name] MRN: [id number] Date of Birth: September 15, 1934  Transition of Care [facility]) CM/SW [place], RN Phone Number: 02/08/2022, 8:41 AM  Clinical Narrative:     Ins Approved for [facility] from 2/2 - 2/6, review due 2/6. Reference ID: [id number]. Plan Auth ID: [id number].   Expected Discharge Plan: [place] Barriers to Discharge: Continued Medical Work up  Expected Discharge Plan and Services       Living arrangements for the past 2 months: Single Family Home                                       Social Determinants of Health (SDOH) Interventions SDOH Screenings   Food Insecurity: No Food Insecurity (10/05/2021)  Housing: Low Risk  (10/05/2021)  Transportation Needs: No Transportation Needs (10/05/2021)  Utilities: Not At Risk (10/05/2021)  Alcohol Screen: Low Risk  (10/05/2021)  Depression (PHQ2-9): Low Risk  (10/05/2021)  Financial Resource Strain: Low Risk  (10/05/2021)  Physical Activity: Inactive (10/05/2021)  Social Connections: Moderately Isolated (10/05/2021)  Stress: No Stress Concern Present (10/05/2021)  Tobacco Use: Medium Risk (02/08/2022)    Readmission Risk Interventions    10/11/2019    9:10 AM  Readmission Risk Prevention Plan  Transportation Screening Complete  PCP or Specialist Appt within 3-5 Days Complete  HRI or [place] Complete  Social Work Consult for [place] Planning/Counseling Complete  Palliative Care Screening Not Applicable  Medication Review Press photographer) Complete

## 2022-02-08 NOTE — TOC Progression Note (Addendum)
Transition of Care [facility]) - Progression Note    Patient Details  Name: Randy [name] MRN: [id number] Date of Birth: 12/02/1934  Transition of Care [facility]) CM/SW [place], RN Phone Number: 02/08/2022, 10:37 AM  Clinical Narrative:     Dimitri [facility], she tells me that they have to rescind the bed offer due to his dementia, I explained that we have Ins approval, she said they can not take him, I called and left a VM for the patient's daughter [name] to review other option, unable to reach her and left a VM asking for a return call   Update, Daughter [name] called back and we reviewed the bed offers again,  She is going to drive out to the facilities and will let me know, I will have to get Ins changed to a new facility   Expected Discharge Plan: Skilled Nursing Facility Barriers to Discharge: Continued Medical Work up  Expected Discharge Plan and Services       Living arrangements for the past 2 months: Single Family Home                                       Social Determinants of Health (SDOH) Interventions SDOH Screenings   Food Insecurity: No Food Insecurity (10/05/2021)  Housing: Low Risk  (10/05/2021)  Transportation Needs: No Transportation Needs (10/05/2021)  Utilities: Not At Risk (10/05/2021)  Alcohol Screen: Low Risk  (10/05/2021)  Depression (PHQ2-9): Low Risk  (10/05/2021)  Financial Resource Strain: Low Risk  (10/05/2021)  Physical Activity: Inactive (10/05/2021)  Social Connections: Moderately Isolated (10/05/2021)  Stress: No Stress Concern Present (10/05/2021)  Tobacco Use: Medium Risk (02/08/2022)    Readmission Risk Interventions    10/11/2019    9:10 AM  Readmission Risk Prevention Plan  Transportation Screening Complete  PCP or Specialist Appt within 3-5 Days Complete  HRI or [place] Complete  Social Work Consult for [place] Planning/Counseling Complete  Palliative Care Screening Not Applicable   Medication Review Press photographer) Complete

## 2022-02-08 NOTE — TOC Progression Note (Signed)
Transition of Care [facility]) - Progression Note    Patient Details  Name: Randy [name] MRN: [id number] Date of Birth: Mar 28, 1934  Transition of Care [facility]) CM/SW [place], RN Phone Number: 02/08/2022, 12:59 PM  Clinical Narrative:    Daughter [name]  accepted the bed at [facility], I notiified [place], Ins will need to be switched to [facility], Ins pending   Expected Discharge Plan: [place] Barriers to Discharge: Continued Medical Work up  Expected Discharge Plan and [place] arrangements for the past 2 months: Single Family Home                                       Social Determinants of Health (SDOH) Interventions SDOH Screenings   Food Insecurity: No Food Insecurity (10/05/2021)  Housing: Low Risk  (10/05/2021)  Transportation Needs: No Transportation Needs (10/05/2021)  Utilities: Not At Risk (10/05/2021)  Alcohol Screen: Low Risk  (10/05/2021)  Depression (PHQ2-9): Low Risk  (10/05/2021)  Financial Resource Strain: Low Risk  (10/05/2021)  Physical Activity: Inactive (10/05/2021)  Social Connections: Moderately Isolated (10/05/2021)  Stress: No Stress Concern Present (10/05/2021)  Tobacco Use: Medium Risk (02/08/2022)    Readmission Risk Interventions    10/11/2019    9:10 AM  Readmission Risk Prevention Plan  Transportation Screening Complete  PCP or Specialist Appt within 3-5 Days Complete  HRI or [place] Complete  Social Work Consult for [place] Planning/Counseling Complete  Palliative Care Screening Not Applicable  Medication Review Press photographer) Complete

## 2022-02-08 NOTE — Plan of Care (Signed)
  Problem: Fluid Volume: Goal: Hemodynamic stability will improve Outcome: Progressing   Problem: Clinical Measurements: Goal: Diagnostic test results will improve Outcome: Progressing Goal: Signs and symptoms of infection will decrease Outcome: Progressing   Problem: Respiratory: Goal: Ability to maintain adequate ventilation will improve Outcome: Progressing   Problem: Education: Goal: Knowledge of General Education information will improve Description: Including pain rating scale, medication(s)/side effects and non-pharmacologic comfort measures Outcome: Not Progressing   Problem: Health Behavior/Discharge Planning: Goal: Ability to manage health-related needs will improve Outcome: Not Progressing   Problem: Clinical Measurements: Goal: Ability to maintain clinical measurements within normal limits will improve Outcome: Progressing Goal: Will remain free from infection Outcome: Progressing Goal: Diagnostic test results will improve Outcome: Progressing Goal: Respiratory complications will improve Outcome: Progressing Goal: Cardiovascular complication will be avoided Outcome: Progressing   Problem: Activity: Goal: Risk for activity intolerance will decrease Outcome: Progressing   Problem: Nutrition: Goal: Adequate nutrition will be maintained Outcome: Progressing   Problem: Elimination: Goal: Will not experience complications related to bowel motility Outcome: Progressing   Problem: Pain Managment: Goal: General experience of comfort will improve Outcome: Progressing   Problem: Safety: Goal: Ability to remain free from injury will improve Outcome: Progressing   Problem: Skin Integrity: Goal: Risk for impaired skin integrity will decrease Outcome: Progressing

## 2022-02-08 NOTE — Progress Notes (Addendum)
Progress Note    Randy [name]  OHY:[id number] DOB: March 25, 1934  DOA: 01/31/2022 PCP: [name], DO      Brief Narrative:    Medical records reviewed and are as summarized below:  Randy [name] is a 88 y.o. male with medical history significant for Lewy body dementia, hypertension, depression, insomnia, Parkinson's disease, who presented to the hospital with hallucinations, change in mental status, cough, fever and shortness of breath.  He was febrile in the emergency room with temperature of 101.1 F.   He was found to have sepsis secondary to influenza A infection, community-acquired pneumonia complicated by acute hypoxic respiratory failure.  BNP was 416.  He was treated with IV Lasix for suspected acute exacerbation of chronic diastolic CHF.  CTA chest did not show any evidence of acute pulmonary embolism.  2D echo in June 2022 showed EF estimated at 55 to 94%, grade 1 diastolic dysfunction.  He was evaluated by PT and OT recommended further rehabilitation at a skilled nursing facility.  Unfortunately, he could not be weaned off of oxygen so he will be discharged on 3 L/min oxygen for chronic hypoxic respiratory failure.  Attempts at weaning from oxygen can be continued in the outpatient setting.      Assessment/Plan:   Principal Problem:   Sepsis ([place]) Active Problems:   Altered mental status   Parkinsonism   Lewy body dementia without behavioral disturbance (HCC)   Essential hypertension   GERD (gastroesophageal reflux disease)   Influenza A   Influenza   Acute respiratory failure with hypoxia (HCC)   Lewy body dementia ([place])   Upper respiratory tract infection due to influenza A virus   Malnutrition of moderate degree   Nutrition Problem: Moderate Malnutrition Etiology: chronic illness (Parkinson's)  Signs/Symptoms: mild fat depletion, moderate fat depletion, mild muscle depletion, moderate muscle depletion   Body mass index is 28.55  kg/m.   Sepsis secondary to influenza A infection, community-acquired pneumonia: Discontinue ceftriaxone and azithromycin.  Completed 7-day course of antibiotics.  Influenza A infection: This was not treated because she was outside the window to benefit from antiviral treatment.  Acute on chronic hypoxic respiratory failure: Unable to wean off oxygen.  Continue with 3L/min oxygen via [place] for probable chronic hypoxic respiratory failure  Probable acute on chronic diastolic CHF: Initially treated with IV Lasix.  Hypokalemia: Improved   Other comorbidities include hypertension, CAD, Parkinson's disease, Lewy body dementia   Diet Order             DIET DYS 3 Room service appropriate? Yes with Assist; Fluid consistency: Thin  Diet effective now                            Consultants: None  Procedures: None    Medications:    ascorbic acid  500 mg Oral Daily   azithromycin  500 mg Oral Daily   carbidopa-levodopa  1 tablet Oral QID   cholecalciferol  1,000 Units Oral Daily   citric acid-potassium citrate  20 mEq Oral QHS   enalapril  5 mg Oral Daily   enoxaparin (LOVENOX) injection  40 mg Subcutaneous Q24H   feeding supplement  237 mL Oral TID BM   ipratropium-albuterol  3 mL Nebulization BID   multivitamin with minerals  1 tablet Oral Daily   pantoprazole  40 mg Oral Daily   rivastigmine  3 mg Oral BID   sertraline  50 mg  Oral Daily   Continuous Infusions:  cefTRIAXone (ROCEPHIN)  IV 2 g (02/07/22 1719)     Anti-infectives (From admission, onward)    Start     Dose/Rate Route Frequency Ordered Stop   02/06/22 1500  azithromycin (ZITHROMAX) tablet 500 mg        500 mg Oral Daily 02/06/22 1328     02/04/22 1800  cefTRIAXone (ROCEPHIN) 2 g in sodium chloride 0.9 % 100 mL IVPB        2 g 200 mL/hr over 30 Minutes Intravenous Every 24 hours 02/04/22 1545     02/04/22 1700  azithromycin (ZITHROMAX) 500 mg in sodium chloride 0.9 % 250 mL IVPB  Status:   Discontinued        500 mg 250 mL/hr over 60 Minutes Intravenous Every 24 hours 02/04/22 1545 02/06/22 1327   01/31/22 1030  cefTRIAXone (ROCEPHIN) 2 g in sodium chloride 0.9 % 100 mL IVPB  Status:  Discontinued        2 g 200 mL/hr over 30 Minutes Intravenous Every 24 hours 01/31/22 1028 01/31/22 1747   01/31/22 1030  azithromycin (ZITHROMAX) 500 mg in sodium chloride 0.9 % 250 mL IVPB  Status:  Discontinued        500 mg 250 mL/hr over 60 Minutes Intravenous Every 24 hours 01/31/22 1028 01/31/22 1747              Family Communication/Anticipated D/C date and plan/Code Status   DVT prophylaxis: enoxaparin (LOVENOX) injection 40 mg Start: 01/31/22 2200 Place TED hose Start: 01/31/22 1246     Code Status: Full Code  Family Communication: None Disposition Plan: Plan to discharge to SNF   Status is: Inpatient Remains inpatient appropriate because: Awaiting placement to SNF       Subjective:   Interval events noted.  He is confused and cannot provide an adequate history.  He has no complaints.  Objective:    Vitals:   02/07/22 2019 02/07/22 2259 02/08/22 0811 02/08/22 0820  BP:  120/78  (!) 155/79  Pulse:  70  70  Resp:  20  17  Temp:  98 F (36.7 C)  98.4 F (36.9 C)  TempSrc:  Oral    SpO2: 94% 98% 96% 97%  Weight:      Height:       No data found.   Intake/Output Summary (Last 24 hours) at 02/08/2022 1124 Last data filed at 02/08/2022 0520 Gross per 24 hour  Intake --  Output 800 ml  Net -800 ml   Filed Weights   01/31/22 1024 02/01/22 1542  Weight: 96.8 kg 95.5 kg    Exam:  GEN: NAD SKIN: Warm and dry EYES: No pallor or icterus ENT: MMM CV: RRR PULM: Mild bibasilar rales no wheezing ABD: soft, ND, NT, +BS CNS: AAO x 1 (person), non focal EXT: No edema or tenderness        Data Reviewed:   I have personally reviewed following labs and imaging studies:  Labs: Labs show the following:   Basic Metabolic Panel: Recent Labs   Lab 02/03/22 0418 02/04/22 0203 02/05/22 0143 02/06/22 0448 02/07/22 0550  NA 132* 135 131* 134* 136  K 3.1* 3.1* 3.1* 3.3* 4.3  CL 96* 94* 92* 95* 97*  CO2 '29 29 29 31 30  '$ GLUCOSE 107* 101* 105* 117* 108*  BUN '17 18 18 21 '$ 25*  CREATININE 0.76 0.77 0.71 0.74 0.79  CALCIUM 7.6* 7.9* 7.7* 8.1* 8.1*   GFR Estimated Creatinine  Clearance: 79.5 mL/min (by C-G formula based on SCr of 0.79 mg/dL). Liver Function Tests: No results for input(s): "AST", "ALT", "ALKPHOS", "BILITOT", "PROT", "ALBUMIN" in the last 168 hours. No results for input(s): "LIPASE", "AMYLASE" in the last 168 hours. No results for input(s): "AMMONIA" in the last 168 hours. Coagulation profile No results for input(s): "INR", "PROTIME" in the last 168 hours.  CBC: Recent Labs  Lab 02/06/22 0448  WBC 4.5  HGB 11.5*  HCT 34.4*  MCV 86.9  PLT 192   Cardiac Enzymes: No results for input(s): "CKTOTAL", "CKMB", "CKMBINDEX", "TROPONINI" in the last 168 hours. BNP (last 3 results) No results for input(s): "PROBNP" in the last 8760 hours. CBG: No results for input(s): "GLUCAP" in the last 168 hours. D-Dimer: No results for input(s): "DDIMER" in the last 72 hours. Hgb A1c: No results for input(s): "HGBA1C" in the last 72 hours. Lipid Profile: No results for input(s): "CHOL", "HDL", "LDLCALC", "TRIG", "CHOLHDL", "LDLDIRECT" in the last 72 hours. Thyroid function studies: No results for input(s): "TSH", "T4TOTAL", "T3FREE", "THYROIDAB" in the last 72 hours.  Invalid input(s): "FREET3" Anemia work up: No results for input(s): "VITAMINB12", "FOLATE", "FERRITIN", "TIBC", "IRON", "RETICCTPCT" in the last 72 hours. Sepsis Labs: Recent Labs  Lab 02/05/22 1257 02/06/22 0448 02/07/22 0550  PROCALCITON <0.10 <0.10 <0.10  WBC  --  4.5  --     Microbiology Recent Results (from the past 240 hour(s))  Blood Culture (routine x 2)     Status: None   Collection Time: 01/31/22 10:30 AM   Specimen: BLOOD  Result Value  Ref Range Status   Specimen Description BLOOD BLOOD LEFT FOREARM  Final   Special Requests   Final    BOTTLES DRAWN AEROBIC AND ANAEROBIC Blood Culture results may not be optimal due to an excessive volume of blood received in culture bottles   Culture   Final    NO GROWTH 5 DAYS Performed at [facility], [address] [place], [place] [address]    Report Status 02/05/2022 FINAL  Final  Resp panel by RT-PCR (RSV, Flu A&B, Covid) Anterior Nasal Swab     Status: Abnormal   Collection Time: 01/31/22 10:31 AM   Specimen: Anterior Nasal Swab  Result Value Ref Range Status   SARS Coronavirus 2 by RT PCR NEGATIVE NEGATIVE Final    Comment: (NOTE) SARS-CoV-2 target nucleic acids are NOT DETECTED.  The SARS-CoV-2 RNA is generally detectable in upper respiratory specimens during the acute phase of infection. The lowest concentration of SARS-CoV-2 viral copies this assay can detect is 138 copies/mL. A negative result does not preclude SARS-Cov-2 infection and should not be used as the sole basis for treatment or other patient management decisions. A negative result may occur with  improper specimen collection/handling, submission of specimen other than nasopharyngeal swab, presence of viral mutation(s) within the areas targeted by this assay, and inadequate number of viral copies(<138 copies/mL). A negative result must be combined with clinical observations, patient history, and epidemiological information. The expected result is Negative.  Fact Sheet for Patients:  EntrepreneurPulse.com.au  Fact Sheet for Healthcare Providers:  IncredibleEmployment.be  This test is no t yet approved or cleared by the [place] FDA and  has been authorized for detection and/or diagnosis of SARS-CoV-2 by FDA under an Emergency Use Authorization (EUA). This EUA will remain  in effect (meaning this test can be used) for the duration of the COVID-19  declaration under Section 564(b)(1) of the Act, 21 U.S.C.section 360bbb-3(b)(1), unless  the authorization is terminated  or revoked sooner.       Influenza A by PCR POSITIVE (A) NEGATIVE Final   Influenza B by PCR NEGATIVE NEGATIVE Final    Comment: (NOTE) The Xpert Xpress SARS-CoV-2/FLU/RSV plus assay is intended as an aid in the diagnosis of influenza from Nasopharyngeal swab specimens and should not be used as a sole basis for treatment. Nasal washings and aspirates are unacceptable for Xpert Xpress SARS-CoV-2/FLU/RSV testing.  Fact Sheet for Patients: EntrepreneurPulse.com.au  Fact Sheet for Healthcare Providers: IncredibleEmployment.be  This test is not yet approved or cleared by the [place] FDA and has been authorized for detection and/or diagnosis of SARS-CoV-2 by FDA under an Emergency Use Authorization (EUA). This EUA will remain in effect (meaning this test can be used) for the duration of the COVID-19 declaration under Section 564(b)(1) of the Act, 21 U.S.C. section 360bbb-3(b)(1), unless the authorization is terminated or revoked.     Resp Syncytial Virus by PCR NEGATIVE NEGATIVE Final    Comment: (NOTE) Fact Sheet for Patients: EntrepreneurPulse.com.au  Fact Sheet for Healthcare Providers: IncredibleEmployment.be  This test is not yet approved or cleared by the [place] FDA and has been authorized for detection and/or diagnosis of SARS-CoV-2 by FDA under an Emergency Use Authorization (EUA). This EUA will remain in effect (meaning this test can be used) for the duration of the COVID-19 declaration under Section 564(b)(1) of the Act, 21 U.S.C. section 360bbb-3(b)(1), unless the authorization is terminated or revoked.  Performed at [facility], [address] [place], [place] [address]   Blood Culture (routine x 2)     Status: None   Collection Time: 01/31/22 10:31  AM   Specimen: BLOOD  Result Value Ref Range Status   Specimen Description BLOOD  RIGHT HAND  Final   Special Requests   Final    BOTTLES DRAWN AEROBIC AND ANAEROBIC Blood Culture adequate volume   Culture   Final    NO GROWTH 5 DAYS Performed at [facility], [address], [place], [place] [address]    Report Status 02/05/2022 FINAL  Final    Procedures and diagnostic studies:  No results found.             LOS: 8 days   Randy [name]  [facility]   Pager on [email or web address]. If 7PM-7AM, please contact night-coverage at [email or web address]     02/08/2022, 11:24 AM

## 2022-02-08 NOTE — Care Management Important Message (Signed)
Important Message  Patient Details  Name: Randy [name] MRN: [id number] Date of Birth: 11/28/1934   Medicare Important Message Given:  Other (see comment)  Left voicemail with [name], daughter, at [phone] to review Medicare IM.  Encouraged callback.     Dannette Barbara 02/08/2022, 2:01 PM

## 2022-02-08 NOTE — Progress Notes (Signed)
Attempted to call report to [place] [phone]). Was placed on hold 4 different times and eventually transferred to a "number that is no longer in service." Attempted again, with no answer. Will send phone number for report with EMS at discharge.

## 2022-02-08 NOTE — Care Management Important Message (Signed)
Important Message  Patient Details  Name: Randy [name] MRN: [id number] Date of Birth: 05-Apr-1934   Medicare Important Message Given:  Yes  Spoke with [name], daughter, at [phone] and reviewed Medicare IM.  Copy of Medicare Im sent securely to daughter's attention at email address on file: [email or web address].    Dannette Barbara 02/08/2022, 2:15 PM

## 2022-02-08 NOTE — TOC Progression Note (Signed)
Transition of Care [facility]) - Progression Note    Patient Details  Name: Randy [name] MRN: [id number] Date of Birth: 10/29/1934  Transition of Care [facility]) CM/SW [place], RN Phone Number: 02/08/2022, 1:56 PM  Clinical Narrative:    approved 2/2 - 2/6, review due 2/6. Reference ID: [id number]. Plan Josem Kaufmann is still generating. To go to [facility] care   Expected Discharge Plan: [place] Barriers to Discharge: Continued Medical Work up  Expected Discharge Plan and Services       Living arrangements for the past 2 months: Single Family Home                                       Social Determinants of Health (SDOH) Interventions SDOH Screenings   Food Insecurity: No Food Insecurity (10/05/2021)  Housing: Low Risk  (10/05/2021)  Transportation Needs: No Transportation Needs (10/05/2021)  Utilities: Not At Risk (10/05/2021)  Alcohol Screen: Low Risk  (10/05/2021)  Depression (PHQ2-9): Low Risk  (10/05/2021)  Financial Resource Strain: Low Risk  (10/05/2021)  Physical Activity: Inactive (10/05/2021)  Social Connections: Moderately Isolated (10/05/2021)  Stress: No Stress Concern Present (10/05/2021)  Tobacco Use: Medium Risk (02/08/2022)    Readmission Risk Interventions    10/11/2019    9:10 AM  Readmission Risk Prevention Plan  Transportation Screening Complete  PCP or Specialist Appt within 3-5 Days Complete  HRI or [place] Complete  Social Work Consult for [place] Planning/Counseling Complete  Palliative Care Screening Not Applicable  Medication Review Press photographer) Complete

## 2022-02-08 NOTE — TOC Progression Note (Signed)
Transition of Care [facility]) - Progression Note    Patient Details  Name: Randy [name] MRN: [id number] Date of Birth: Jun 11, 1934  Transition of Care [facility]) CM/SW [place], RN Phone Number: 02/08/2022, 2:43 PM  Clinical Narrative:    Spoke to the daughter [name] and explained the room number 44Z at [facility]  She stated understanding She wants to have him transported by EMS EMS called to transport   Expected Discharge Plan: [place] Barriers to Discharge: Continued Medical Work up  Expected Discharge Plan and [place] arrangements for the past 2 months: Single Family Home Expected Discharge Date: 02/08/22                                     Social Determinants of Health (SDOH) Interventions SDOH Screenings   Food Insecurity: No Food Insecurity (10/05/2021)  Housing: Low Risk  (10/05/2021)  Transportation Needs: No Transportation Needs (10/05/2021)  Utilities: Not At Risk (10/05/2021)  Alcohol Screen: Low Risk  (10/05/2021)  Depression (PHQ2-9): Low Risk  (10/05/2021)  Financial Resource Strain: Low Risk  (10/05/2021)  Physical Activity: Inactive (10/05/2021)  Social Connections: Moderately Isolated (10/05/2021)  Stress: No Stress Concern Present (10/05/2021)  Tobacco Use: Medium Risk (02/08/2022)    Readmission Risk Interventions    10/11/2019    9:10 AM  Readmission Risk Prevention Plan  Transportation Screening Complete  PCP or Specialist Appt within 3-5 Days Complete  HRI or [place] Complete  Social Work Consult for [place] Planning/Counseling Complete  Palliative Care Screening Not Applicable  Medication Review Press photographer) Complete

## 2022-02-08 NOTE — Discharge Summary (Addendum)
Physician Discharge Summary   Patient: Randy [name] MRN: [id number] DOB: 1934/08/05  Admit date:     01/31/2022  Discharge date: 02/08/22  Discharge Physician: Jennye Boroughs   PCP: [name], DO   Recommendations at discharge:   Follow-up with physician at the nursing home within 3 days of discharge Follow-up with PCP for routine health maintenance  Discharge Diagnoses: Principal Problem:   Sepsis ([place]) Active Problems:   Altered mental status   Parkinsonism   Lewy body dementia without behavioral disturbance (HCC)   Essential hypertension   GERD (gastroesophageal reflux disease)   Influenza A   Influenza   Acute respiratory failure with hypoxia (HCC)   Lewy body dementia ([place])   Upper respiratory tract infection due to influenza A virus   Malnutrition of moderate degree   CAP (community acquired pneumonia)  Resolved Problems:   * No resolved hospital problems. *  Hospital Course:  Randy [name] is a 88 y.o. male with medical history significant for Lewy body dementia, hypertension, depression, insomnia, Parkinson's disease, who presented to the hospital with hallucinations, change in mental status, cough, fever and shortness of breath.  He was febrile in the emergency room with temperature of 101.1 F.     He was found to have sepsis secondary to influenza A infection, community-acquired pneumonia complicated by acute hypoxic respiratory failure.  BNP was 416.  He was treated with IV Lasix for suspected acute exacerbation of chronic diastolic CHF.  CTA chest did not show any evidence of acute pulmonary embolism.  2D echo in June 2022 showed EF estimated at 55 to 29%, grade 1 diastolic dysfunction.   He was evaluated by PT and OT recommended further rehabilitation at a skilled nursing facility.  Unfortunately, he could not be weaned off of oxygen so he will be discharged on 3 L/min oxygen for chronic hypoxic respiratory failure. Attempts at weaning from oxygen  can be continued in the outpatient setting. He is deemed medically stable for discharge to SNF today.  Discharge plan was discussed with [name], daughter.   Assessment and Plan:   Sepsis secondary to influenza A infection, community-acquired pneumonia:  Completed course of IV ceftriaxone and azithromycin   Influenza A infection: This was not treated because he was outside the window to benefit from antiviral treatment.   Acute on chronic hypoxic respiratory failure: Unable to wean off oxygen. Continue 3 L/min oxygen via [place] for probable chronic hypoxic respiratory failure   Probable acute on chronic diastolic CHF: Initially treated with IV Lasix.   Hypokalemia: Improved     Other comorbidities include hypertension, CAD, Parkinson's disease, Lewy body dementia           Consultants: None Procedures performed: None  Disposition: Skilled nursing facility Diet recommendation:  Discharge Diet Orders (From admission, onward)     Start     Ordered   02/08/22 0000  DIET DYS 3       Question:  Fluid consistency:  Answer:  Thin   02/08/22 1411           Dysphagia type 3 thin Liquid DISCHARGE MEDICATION: Allergies as of 02/08/2022       Reactions   Antazoline Diarrhea   Antihistamines, Chlorpheniramine-type Other (See Comments)   Can't urinate        Medication List     STOP taking these medications    cyanocobalamin 1000 MCG tablet   fluticasone 50 MCG/ACT nasal spray Commonly known as: FLONASE  TAKE these medications    acetaminophen 500 MG tablet Commonly known as: TYLENOL Take 1,000 mg by mouth every 6 (six) hours.   ascorbic acid 500 MG tablet Commonly known as: VITAMIN C Take 500 mg by mouth daily.   carbidopa-levodopa 50-200 MG tablet Commonly known as: SINEMET CR Take 1 tablet by mouth 4 (four) times daily.   cholecalciferol 1000 units tablet Commonly known as: VITAMIN D Take 1,000 Units by mouth daily.   Co Q 10 100 MG Caps Take  200 mg by mouth daily.   diclofenac Sodium 1 % Gel Commonly known as: VOLTAREN APPLY 2 G TOPICALLY 4 (FOUR) TIMES DAILY AS NEEDED. TO KNEE ARTHRITIS PAIN   enalapril 5 MG tablet Commonly known as: VASOTEC TAKE 1 TABLET (5 MG TOTAL) BY MOUTH DAILY.   Fish Oil 1200 MG Caps Take 1,200 mg by mouth in the morning, at noon, in the evening, and at bedtime.   furosemide 20 MG tablet Commonly known as: LASIX Take 1 tablet (20 mg total) by mouth every other day. What changed: additional instructions   HYDROcodone-acetaminophen 5-325 MG tablet Commonly known as: NORCO/VICODIN Take 1 tablet by mouth every 6 (six) hours as needed for moderate pain.   melatonin 5 MG Tabs Take 1 tablet (5 mg total) by mouth at bedtime. What changed:  how much to take additional instructions   multivitamin with minerals Tabs tablet Take 1 tablet by mouth daily.   omeprazole 20 MG capsule Commonly known as: PRILOSEC Take 20 mg by mouth 2 (two) times daily.   ondansetron 4 MG disintegrating tablet Commonly known as: ZOFRAN-ODT Take 1 tablet (4 mg total) by mouth every 8 (eight) hours as needed for nausea or vomiting.   rivastigmine 3 MG capsule Commonly known as: EXELON Take 3 mg by mouth 2 (two) times daily.   sertraline 50 MG tablet Commonly known as: ZOLOFT Take 50 mg by mouth daily.        Contact information for after-discharge care     [facility] .   Service: Skilled Nursing Contact information: [place] [facility] [phone]                    Discharge Exam: Danley Danker Weights   01/31/22 1024 02/01/22 1542  Weight: 96.8 kg 95.5 kg   GEN: NAD SKIN: Warm and dry EYES: No pallor or icterus ENT: MMM CV: RRR PULM: Mild bibasilar rales no wheezing ABD: soft, ND, NT, +BS CNS: AAO x 1 (person), non focal EXT: No edema or tenderness    Condition at discharge: stable  The results of significant  diagnostics from this hospitalization (including imaging, microbiology, ancillary and laboratory) are listed below for reference.   Imaging Studies: CT Angio Chest Pulmonary Embolism (PE) W or WO Contrast  Result Date: 02/05/2022 CLINICAL DATA:  Progressive confusion and hypoxemia. Clinical concern for pulmonary embolism, high probability. History of dementia, hypertension and Parkinson's disease. EXAM: CT ANGIOGRAPHY CHEST WITH CONTRAST TECHNIQUE: Multidetector CT imaging of the chest was performed using the standard protocol during bolus administration of intravenous contrast. Multiplanar CT image reconstructions and MIPs were obtained to evaluate the vascular anatomy. RADIATION DOSE REDUCTION: This exam was performed according to the departmental dose-optimization program which includes automated exposure control, adjustment of the mA and/or kV according to patient size and/or use of iterative reconstruction technique. CONTRAST:  27m OMNIPAQUE IOHEXOL 350 MG/ML SOLN COMPARISON:  Chest radiographs 02/04/2022 and  01/31/2022. Cardiac CT 07/04/2020. Abdominal CT 09/08/2018. FINDINGS: Cardiovascular: The pulmonary arteries are well opacified with contrast to the level of the subsegmental branches. There is no evidence of acute pulmonary embolism. Atherosclerosis of the aorta, great vessels and coronary arteries. Although opacification of the thoracic aorta is limited, there is no evidence of acute systemic arterial abnormality. Aortic valvular calcifications are noted. The heart is mildly enlarged and there is minimal pericardial fluid. Mediastinum/Nodes: Mildly prominent right hilar and subcarinal lymph nodes are likely reactive. No other enlarged thoracic lymph nodes demonstrated. The thyroid gland, trachea and esophagus demonstrate no significant findings. Lungs/Pleura: Small right-greater-than-left pleural effusions. As seen on yesterday CT, there are new ground-glass opacities throughout both upper lobes,  worse on the right. Mild dependent pulmonary opacities bilaterally likely represent atelectasis. There is evidence of mild underlying centrilobular and paraseptal emphysema. Scattered small calcified granulomas are noted. Upper abdomen: No acute findings are seen in the visualized upper abdomen. Innumerable renal lesions are present bilaterally, some of which measure higher than water density, likely reflecting cysts of varying complexity. These are similar to previous abdominal CT. Musculoskeletal/Chest wall: There is no chest wall mass or suspicious osseous finding. There are posttraumatic deformities of the upper sternal body and the T7 vertebral body. There is an old left-sided rib fracture. Review of the MIP images confirms the above findings. IMPRESSION: 1. No evidence of acute pulmonary embolism or other acute vascular findings in the chest. 2. New ground-glass opacities throughout both upper lobes, worse on the right, suspicious for atypical pneumonia. Recommend chest radiographic follow-up. 3. Small right-greater-than-left pleural effusions with mild dependent atelectasis bilaterally. 4. Mildly prominent right hilar and subcarinal lymph nodes, likely reactive. 5. Aortic Atherosclerosis (ICD10-I70.0) and Emphysema (ICD10-J43.9). Electronically Signed   By: Richardean Sale M.D.   On: 02/05/2022 18:23   DG Chest Port 1 View  Result Date: 02/04/2022 CLINICAL DATA:  Abnormal lung sounds EXAM: PORTABLE CHEST 1 VIEW COMPARISON:  Chest x-ray dated January 31, 2022 FINDINGS: The heart size and mediastinal contours are within normal limits. New bilateral upper lobe heterogeneous opacities. The visualized skeletal structures are unremarkable. IMPRESSION: New bilateral upper lung heterogeneous opacities, likely due to infection or pulmonary edema. Electronically Signed   By: Yetta Glassman M.D.   On: 02/04/2022 10:57   DG Chest Port 1 View  Result Date: 01/31/2022 CLINICAL DATA:  Questionable sepsis, altered  mental status, hallucinations EXAM: PORTABLE CHEST 1 VIEW COMPARISON:  Portable exam 1057 hours compared to 11/05/2021 FINDINGS: Enlargement of cardiac silhouette with pulmonary vascular congestion. Atherosclerotic calcification aorta. Increased interstitial markings in both lungs greatest in RIGHT upper lobe, could represent minimal asymmetric pulmonary edema or infection. Subsegmental atelectasis LEFT base medially. No pleural effusion or pneumothorax. Osseous structures unremarkable. IMPRESSION: Enlargement of cardiac silhouette with pulmonary vascular congestion. Accentuated interstitial markings especially RIGHT upper lobe, question minimal aseptic pulmonary edema or infection. Aortic Atherosclerosis (ICD10-I70.0). Electronically Signed   By: Lavonia Dana M.D.   On: 01/31/2022 11:06    Microbiology: Results for orders placed or performed during the hospital encounter of 01/31/22  Blood Culture (routine x 2)     Status: None   Collection Time: 01/31/22 10:30 AM   Specimen: BLOOD  Result Value Ref Range Status   Specimen Description BLOOD BLOOD LEFT FOREARM  Final   Special Requests   Final    BOTTLES DRAWN AEROBIC AND ANAEROBIC Blood Culture results may not be optimal due to an excessive volume of blood received in culture bottles  Culture   Final    NO GROWTH 5 DAYS Performed at [facility], [address] [place], [place] [address]    Report Status 02/05/2022 FINAL  Final  Resp panel by RT-PCR (RSV, Flu A&B, Covid) Anterior Nasal Swab     Status: Abnormal   Collection Time: 01/31/22 10:31 AM   Specimen: Anterior Nasal Swab  Result Value Ref Range Status   SARS Coronavirus 2 by RT PCR NEGATIVE NEGATIVE Final    Comment: (NOTE) SARS-CoV-2 target nucleic acids are NOT DETECTED.  The SARS-CoV-2 RNA is generally detectable in upper respiratory specimens during the acute phase of infection. The lowest concentration of SARS-CoV-2 viral copies this assay can detect is 138  copies/mL. A negative result does not preclude SARS-Cov-2 infection and should not be used as the sole basis for treatment or other patient management decisions. A negative result may occur with  improper specimen collection/handling, submission of specimen other than nasopharyngeal swab, presence of viral mutation(s) within the areas targeted by this assay, and inadequate number of viral copies(<138 copies/mL). A negative result must be combined with clinical observations, patient history, and epidemiological information. The expected result is Negative.  Fact Sheet for Patients:  EntrepreneurPulse.com.au  Fact Sheet for Healthcare Providers:  IncredibleEmployment.be  This test is no t yet approved or cleared by the [place] FDA and  has been authorized for detection and/or diagnosis of SARS-CoV-2 by FDA under an Emergency Use Authorization (EUA). This EUA will remain  in effect (meaning this test can be used) for the duration of the COVID-19 declaration under Section 564(b)(1) of the Act, 21 U.S.C.section 360bbb-3(b)(1), unless the authorization is terminated  or revoked sooner.       Influenza A by PCR POSITIVE (A) NEGATIVE Final   Influenza B by PCR NEGATIVE NEGATIVE Final    Comment: (NOTE) The Xpert Xpress SARS-CoV-2/FLU/RSV plus assay is intended as an aid in the diagnosis of influenza from Nasopharyngeal swab specimens and should not be used as a sole basis for treatment. Nasal washings and aspirates are unacceptable for Xpert Xpress SARS-CoV-2/FLU/RSV testing.  Fact Sheet for Patients: EntrepreneurPulse.com.au  Fact Sheet for Healthcare Providers: IncredibleEmployment.be  This test is not yet approved or cleared by the [place] FDA and has been authorized for detection and/or diagnosis of SARS-CoV-2 by FDA under an Emergency Use Authorization (EUA). This EUA will remain in effect  (meaning this test can be used) for the duration of the COVID-19 declaration under Section 564(b)(1) of the Act, 21 U.S.C. section 360bbb-3(b)(1), unless the authorization is terminated or revoked.     Resp Syncytial Virus by PCR NEGATIVE NEGATIVE Final    Comment: (NOTE) Fact Sheet for Patients: EntrepreneurPulse.com.au  Fact Sheet for Healthcare Providers: IncredibleEmployment.be  This test is not yet approved or cleared by the [place] FDA and has been authorized for detection and/or diagnosis of SARS-CoV-2 by FDA under an Emergency Use Authorization (EUA). This EUA will remain in effect (meaning this test can be used) for the duration of the COVID-19 declaration under Section 564(b)(1) of the Act, 21 U.S.C. section 360bbb-3(b)(1), unless the authorization is terminated or revoked.  Performed at [facility], [address] [place], [place] [address]   Blood Culture (routine x 2)     Status: None   Collection Time: 01/31/22 10:31 AM   Specimen: BLOOD  Result Value Ref Range Status   Specimen Description BLOOD  RIGHT HAND  Final   Special Requests   Final  BOTTLES DRAWN AEROBIC AND ANAEROBIC Blood Culture adequate volume   Culture   Final    NO GROWTH 5 DAYS Performed at [facility], [address] [place], [place] [address]    Report Status 02/05/2022 FINAL  Final    Labs: CBC: Recent Labs  Lab 02/06/22 0448  WBC 4.5  HGB 11.5*  HCT 34.4*  MCV 86.9  PLT 856   Basic Metabolic Panel: Recent Labs  Lab 02/03/22 0418 02/04/22 0203 02/05/22 0143 02/06/22 0448 02/07/22 0550  NA 132* 135 131* 134* 136  K 3.1* 3.1* 3.1* 3.3* 4.3  CL 96* 94* 92* 95* 97*  CO2 '29 29 29 31 30  '$ GLUCOSE 107* 101* 105* 117* 108*  BUN '17 18 18 21 '$ 25*  CREATININE 0.76 0.77 0.71 0.74 0.79  CALCIUM 7.6* 7.9* 7.7* 8.1* 8.1*   Liver Function Tests: No results for input(s): "AST", "ALT", "ALKPHOS", "BILITOT", "PROT",  "ALBUMIN" in the last 168 hours. CBG: No results for input(s): "GLUCAP" in the last 168 hours.  Discharge time spent: greater than 30 minutes.  Signed: Jennye Boroughs, MD [facility] 02/08/2022

## 2022-02-25 ENCOUNTER — Telehealth: Payer: Medicare Other

## 2022-02-26 ENCOUNTER — Telehealth: Payer: Self-pay

## 2022-02-26 ENCOUNTER — Telehealth: Payer: Self-pay | Admitting: Pharmacist

## 2022-02-26 NOTE — Telephone Encounter (Signed)
   Outreach Note  02/25/2022 Name: Randy [name] MRN: [id number] DOB: Mar 10, 1934  Referred by: [name], DO Reason for referral : No chief complaint on file.   Was unable to reach patient/daughter via telephone today and have left HIPAA compliant voicemail asking patient to return my call.    Follow Up Plan: Will collaborate with Care Guide to outreach to schedule follow up with me  Wallace Cullens, PharmD, [place] [phone]

## 2022-02-26 NOTE — Transitions of Care (Post Inpatient/ED Visit) (Signed)
   02/26/2022  Name: Randy [name] MRN: [id number] DOB: 08/31/1934  Today's TOC FU Call Status: Today's TOC FU Call Status:: Successful TOC FU Call Competed TOC FU Call Complete Date: 02/26/22  Transition Care Management Follow-up Telephone Call Date of Discharge: 02/25/22 Discharge Facility: Other ([place]) Name of Other ([place]) Discharge Facility: [facility] Type of Discharge: Inpatient Admission Primary Inpatient Discharge Diagnosis:: weakness/ rehab How have you been since you were released from the hospital?: Better Any questions or concerns?: No  Items Reviewed: Did you receive and understand the discharge instructions provided?: Yes Medications obtained and verified?: Yes (Medications Reviewed) Any new allergies since your discharge?: No Dietary orders reviewed?: NA Do you have support at home?: Yes  Home Care and Equipment/Supplies: [place] Ordered?: Yes Name of [place]:: [place] Has Agency set up a time to come to your home?: No Any new equipment or medical supplies ordered?: No  Functional Questionnaire: Do you need assistance with bathing/showering or dressing?: Yes (assistance) Do you need assistance with meal preparation?: Yes (assistance) Do you need assistance with eating?: No Do you have difficulty maintaining continence: No Do you need assistance with getting out of bed/getting out of a chair/moving?: No Do you have difficulty managing or taking your medications?: Yes (daughter manages)  Folllow up appointments reviewed: PCP Follow-up appointment confirmed?: Yes Date of PCP follow-up appointment?: 03/04/22 Follow-up Provider: [name], [facility] Follow-up appointment confirmed?: NA Do you need transportation to your follow-up appointment?: No Do you understand care options if your condition(s) worsen?: Yes-patient verbalized understanding    [place] LPN [place] Direct Dial [phone]

## 2022-02-28 ENCOUNTER — Telehealth: Payer: Self-pay | Admitting: Family Medicine

## 2022-02-28 NOTE — Telephone Encounter (Signed)
Home Health Verbal Orders - Caller/Agency: Shoni/ [facility]  Callback Number: [id number] vm can be left  Requesting PT Frequency: 1 x a week for 8 weeks

## 2022-02-28 NOTE — Telephone Encounter (Signed)
Okay to proceed w/ verbal orders  [name], [facility] 02/28/2022, 4:02 PM

## 2022-03-01 ENCOUNTER — Telehealth: Payer: Self-pay | Admitting: Family Medicine

## 2022-03-01 NOTE — Telephone Encounter (Signed)
Home Health Verbal Orders - Caller/Agency: Glori Bickers [place] Number: [id number] Requesting OT/PT/Skilled Nursing/Social Work/Speech Therapy: OT  Frequency:   1w6

## 2022-03-01 NOTE — Telephone Encounter (Signed)
Shoni advised.    Thanks,   -Mickel Baas

## 2022-03-04 ENCOUNTER — Ambulatory Visit: Payer: Medicare Other | Admitting: Family Medicine

## 2022-03-04 ENCOUNTER — Encounter: Payer: Self-pay | Admitting: Family Medicine

## 2022-03-04 VITALS — BP 122/86 | HR 77 | Ht 72.0 in | Wt 210.0 lb

## 2022-03-04 DIAGNOSIS — G8929 Other chronic pain: Secondary | ICD-10-CM

## 2022-03-04 DIAGNOSIS — M150 Primary generalized (osteo)arthritis: Secondary | ICD-10-CM

## 2022-03-04 DIAGNOSIS — M25562 Pain in left knee: Secondary | ICD-10-CM

## 2022-03-04 DIAGNOSIS — I890 Lymphedema, not elsewhere classified: Secondary | ICD-10-CM

## 2022-03-04 DIAGNOSIS — G3183 Dementia with Lewy bodies: Secondary | ICD-10-CM | POA: Diagnosis not present

## 2022-03-04 DIAGNOSIS — F0280 Dementia in other diseases classified elsewhere without behavioral disturbance: Secondary | ICD-10-CM

## 2022-03-04 DIAGNOSIS — M159 Polyosteoarthritis, unspecified: Secondary | ICD-10-CM | POA: Diagnosis not present

## 2022-03-04 NOTE — Progress Notes (Signed)
Subjective:    Patient ID: Randy [name], male    DOB: [birth year], 88 y.o.   MRN: [id number]  Randy [name] is a 88 y.o. male presenting on 03/04/2022 for Hospitalization Follow-up   HPI  HOSPITAL FOLLOW-UP VISIT  Hospital/Location: [facility] Date of Admission: 01/31/22 Date of Discharge: 02/08/22 to SNF Discharged from SNF to home on 02/25/22 Transitions of care telephone call: 02/26/22 TOC call completed Vennie Homans LPN  Reason for Admission: Flu and Weakness  - Hospital H&P and Discharge Summary have been reviewed - Patient presents today 1 month after recent hospitalization. Brief summary of recent course, patient had symptoms of weakness, following influenza, hospitalized, discharged to SNF then to home with home health  Currently enrolled in [facility], we will sign orders for PT/OT and home health.  She has good caregiver support during the day.  Needs caregiver support at night looking for at least 5 days, prefer 5-7 days at night, 9pm to 7am time for caregiver support. Asking for person or company, cannot get medicaid PCS  His function has improved on Pain Medication refills can help.   Hydrocodone-Acetaminophen 10/325 AS NEEDED - had skipped some doses  Difficulty with weakness with standing  Knee advanced OA/DJD, PT cannot even straighten the knee Orthopedic consider gel injections  - New medications on discharge: none - Changes to current meds on discharge: none  I have reviewed the discharge medication list, and have reconciled the current and discharge medications today.   Current Outpatient Medications:    acetaminophen (TYLENOL) 500 MG tablet, Take 1,000 mg by mouth every 6 (six) hours., Disp: , Rfl:    carbidopa-levodopa (SINEMET CR) 50-200 MG tablet, Take 1 tablet by mouth 4 (four) times daily., Disp: , Rfl:    cholecalciferol (VITAMIN D) 1000 units tablet, Take 1,000 Units by mouth daily., Disp: , Rfl:    Coenzyme Q10 (CO Q 10) 100 MG CAPS, Take 200 mg by  mouth daily., Disp: , Rfl:    enalapril (VASOTEC) 5 MG tablet, TAKE 1 TABLET (5 MG TOTAL) BY MOUTH DAILY., Disp: 90 tablet, Rfl: 3   furosemide (LASIX) 20 MG tablet, Take 1 tablet (20 mg total) by mouth every other day. (Patient taking differently: Take 20 mg by mouth every other day. Patient takes Mondays, Wednesdays, and Fridays.), Disp: 45 tablet, Rfl: 0   HYDROcodone-acetaminophen (NORCO/VICODIN) 5-325 MG tablet, Take 1 tablet by mouth every 6 (six) hours as needed for moderate pain., Disp: 6 tablet, Rfl: 0   melatonin 5 MG TABS, Take 1 tablet (5 mg total) by mouth at bedtime. (Patient taking differently: Take 10 mg by mouth at bedtime. Patient takes 10 mg at bedtime), Disp: , Rfl: 0   Multiple Vitamin (MULTIVITAMIN WITH MINERALS) TABS tablet, Take 1 tablet by mouth daily., Disp: 30 tablet, Rfl: 0   Omega-3 Fatty Acids (FISH OIL) 1200 MG CAPS, Take 1,200 mg by mouth in the morning, at noon, in the evening, and at bedtime., Disp: , Rfl:    omeprazole (PRILOSEC) 20 MG capsule, Take 20 mg by mouth 2 (two) times daily., Disp: , Rfl:    ondansetron (ZOFRAN-ODT) 4 MG disintegrating tablet, Take 1 tablet (4 mg total) by mouth every 8 (eight) hours as needed for nausea or vomiting., Disp: 20 tablet, Rfl: 0   rivastigmine (EXELON) 3 MG capsule, Take 3 mg by mouth 2 (two) times daily., Disp: , Rfl:    sertraline (ZOLOFT) 50 MG tablet, Take 50 mg by mouth daily., Disp: ,  Rfl:    vitamin C (ASCORBIC ACID) 500 MG tablet, Take 500 mg by mouth daily., Disp: , Rfl:    diclofenac Sodium (VOLTAREN) 1 % GEL, APPLY 2 G TOPICALLY 4 (FOUR) TIMES DAILY AS NEEDED. TO KNEE ARTHRITIS PAIN (Patient not taking: Reported on 02/26/2022), Disp: 100 g, Rfl: 3  ------------------------------------------------------------------------- Social History   Tobacco Use   Smoking status: Former    Packs/day: 1.00    Years: 12.00    Total pack years: 12.00    Types: Cigarettes    Quit date: 1968    Years since quitting: 56.1    Smokeless tobacco: Never  Vaping Use   Vaping Use: Never used  Substance Use Topics   Alcohol use: No   Drug use: No    Review of Systems Per HPI unless specifically indicated above     Objective:    BP 122/86   Pulse 77   Ht 6' (1.829 m)   Wt 210 lb (95.3 kg)   SpO2 99%   BMI 28.48 kg/m   Wt Readings from Last 3 Encounters:  03/04/22 210 lb (95.3 kg)  02/01/22 210 lb 8.6 oz (95.5 kg)  11/05/21 200 lb (90.7 kg)    Physical Exam Vitals and nursing note reviewed.  Constitutional:      General: He is not in acute distress.    Appearance: He is well-developed. He is not diaphoretic.     Comments: Well-appearing, comfortable, cooperative  HENT:     Head: Normocephalic and atraumatic.  Eyes:     General:        Right eye: No discharge.        Left eye: No discharge.     Conjunctiva/sclera: Conjunctivae normal.  Neck:     Thyroid: No thyromegaly.  Cardiovascular:     Rate and Rhythm: Normal rate and regular rhythm.     Pulses: Normal pulses.     Heart sounds: Normal heart sounds. No murmur heard. Pulmonary:     Effort: Pulmonary effort is normal. No respiratory distress.     Breath sounds: Normal breath sounds. No wheezing or rales.  Musculoskeletal:        General: Normal range of motion.     Cervical back: Normal range of motion and neck supple.     Right lower leg: Edema present.     Left lower leg: Edema present.     Comments: Wheelchair  Compression wraps, zipper, on  Lymphadenopathy:     Cervical: No cervical adenopathy.  Skin:    General: Skin is warm and dry.     Findings: No erythema or rash.  Neurological:     Mental Status: He is alert and oriented to person, place, and time. Mental status is at baseline.  Psychiatric:        Behavior: Behavior normal.     Comments: Well groomed, good eye contact, normal speech and thoughts       Results for orders placed or performed during the hospital encounter of 01/31/22  Resp panel by RT-PCR (RSV, Flu  A&B, Covid) Anterior Nasal Swab   Specimen: Anterior Nasal Swab  Result Value Ref Range   SARS Coronavirus 2 by RT PCR NEGATIVE NEGATIVE   Influenza A by PCR POSITIVE (A) NEGATIVE   Influenza B by PCR NEGATIVE NEGATIVE   Resp Syncytial Virus by PCR NEGATIVE NEGATIVE  Blood Culture (routine x 2)   Specimen: BLOOD  Result Value Ref Range   Specimen Description BLOOD BLOOD LEFT FOREARM  Special Requests      BOTTLES DRAWN AEROBIC AND ANAEROBIC Blood Culture results may not be optimal due to an excessive volume of blood received in culture bottles   Culture      NO GROWTH 5 DAYS Performed at [facility], [address] [place], [place] [address]    Report Status 02/05/2022 FINAL   Blood Culture (routine x 2)   Specimen: BLOOD  Result Value Ref Range   Specimen Description BLOOD  RIGHT HAND    Special Requests      BOTTLES DRAWN AEROBIC AND ANAEROBIC Blood Culture adequate volume   Culture      NO GROWTH 5 DAYS Performed at [facility], [address] [place], [place] [address]    Report Status 02/05/2022 FINAL   Lactic acid, plasma  Result Value Ref Range   Lactic Acid, Venous 0.8 0.5 - 1.9 mmol/L  Comprehensive metabolic panel  Result Value Ref Range   Sodium 136 135 - 145 mmol/L   Potassium 3.4 (L) 3.5 - 5.1 mmol/L   Chloride 103 98 - 111 mmol/L   CO2 27 22 - 32 mmol/L   Glucose, Bld 119 (H) 70 - 99 mg/dL   BUN 20 8 - 23 mg/dL   Creatinine, Ser 0.79 0.61 - 1.24 mg/dL   Calcium 8.3 (L) 8.9 - 10.3 mg/dL   Total Protein 6.7 6.5 - 8.1 g/dL   Albumin 3.4 (L) 3.5 - 5.0 g/dL   AST 17 15 - 41 U/L   ALT 6 0 - 44 U/L   Alkaline Phosphatase 72 38 - 126 U/L   Total Bilirubin 1.3 (H) 0.3 - 1.2 mg/dL   GFR, Estimated >60 >60 mL/min   Anion gap 6 5 - 15  CBC with Differential  Result Value Ref Range   WBC 4.2 4.0 - 10.5 K/uL   RBC 3.76 (L) 4.22 - 5.81 MIL/uL   Hemoglobin 11.2 (L) 13.0 - 17.0 g/dL   HCT 34.1 (L) 39.0 - 52.0 %   MCV 90.7 80.0 - 100.0 fL    MCH 29.8 26.0 - 34.0 pg   MCHC 32.8 30.0 - 36.0 g/dL   RDW 14.1 11.5 - 15.5 %   Platelets 156 150 - 400 K/uL   nRBC 0.0 0.0 - 0.2 %   Neutrophils Relative % 87 %   Neutro Abs 3.7 1.7 - 7.7 K/uL   Lymphocytes Relative 5 %   Lymphs Abs 0.2 (L) 0.7 - 4.0 K/uL   Monocytes Relative 7 %   Monocytes Absolute 0.3 0.1 - 1.0 K/uL   Eosinophils Relative 0 %   Eosinophils Absolute 0.0 0.0 - 0.5 K/uL   Basophils Relative 1 %   Basophils Absolute 0.0 0.0 - 0.1 K/uL   Immature Granulocytes 0 %   Abs Immature Granulocytes 0.01 0.00 - 0.07 K/uL  Protime-INR  Result Value Ref Range   Prothrombin Time 13.7 11.4 - 15.2 seconds   INR 1.1 0.8 - 1.2  APTT  Result Value Ref Range   aPTT 35 24 - 36 seconds  Urinalysis, w/ Reflex to Culture (Infection Suspected) -Urine, Clean Catch  Result Value Ref Range   Specimen Source URINE, CLEAN CATCH    Color, Urine YELLOW (A) YELLOW   APPearance CLEAR (A) CLEAR   Specific Gravity, Urine 1.018 1.005 - 1.030   pH 5.0 5.0 - 8.0   Glucose, UA NEGATIVE NEGATIVE mg/dL   Hgb urine dipstick NEGATIVE NEGATIVE   Bilirubin Urine NEGATIVE NEGATIVE  Ketones, ur 5 (A) NEGATIVE mg/dL   Protein, ur NEGATIVE NEGATIVE mg/dL   Nitrite NEGATIVE NEGATIVE   Leukocytes,Ua NEGATIVE NEGATIVE   RBC / HPF 0-5 0 - 5 RBC/hpf   WBC, UA 0-5 0 - 5 WBC/hpf   Bacteria, UA RARE (A) NONE SEEN   Squamous Epithelial / HPF NONE SEEN 0 - 5 /HPF   Mucus PRESENT   Procalcitonin  Result Value Ref Range   Procalcitonin <0.10 ng/mL  Basic metabolic panel  Result Value Ref Range   Sodium 136 135 - 145 mmol/L   Potassium 3.5 3.5 - 5.1 mmol/L   Chloride 103 98 - 111 mmol/L   CO2 23 22 - 32 mmol/L   Glucose, Bld 87 70 - 99 mg/dL   BUN 22 8 - 23 mg/dL   Creatinine, Ser 0.76 0.61 - 1.24 mg/dL   Calcium 7.8 (L) 8.9 - 10.3 mg/dL   GFR, Estimated >60 >60 mL/min   Anion gap 10 5 - 15  CBC  Result Value Ref Range   WBC 5.1 4.0 - 10.5 K/uL   RBC 3.58 (L) 4.22 - 5.81 MIL/uL   Hemoglobin 10.7  (L) 13.0 - 17.0 g/dL   HCT 33.8 (L) 39.0 - 52.0 %   MCV 94.4 80.0 - 100.0 fL   MCH 29.9 26.0 - 34.0 pg   MCHC 31.7 30.0 - 36.0 g/dL   RDW 14.4 11.5 - 15.5 %   Platelets 131 (L) 150 - 400 K/uL   nRBC 0.0 0.0 - 0.2 %  Magnesium  Result Value Ref Range   Magnesium 1.8 1.7 - 2.4 mg/dL  Basic metabolic panel  Result Value Ref Range   Sodium 132 (L) 135 - 145 mmol/L   Potassium 3.1 (L) 3.5 - 5.1 mmol/L   Chloride 96 (L) 98 - 111 mmol/L   CO2 29 22 - 32 mmol/L   Glucose, Bld 107 (H) 70 - 99 mg/dL   BUN 17 8 - 23 mg/dL   Creatinine, Ser 0.76 0.61 - 1.24 mg/dL   Calcium 7.6 (L) 8.9 - 10.3 mg/dL   GFR, Estimated >60 >60 mL/min   Anion gap 7 5 - 15  Basic metabolic panel  Result Value Ref Range   Sodium 135 135 - 145 mmol/L   Potassium 3.1 (L) 3.5 - 5.1 mmol/L   Chloride 94 (L) 98 - 111 mmol/L   CO2 29 22 - 32 mmol/L   Glucose, Bld 101 (H) 70 - 99 mg/dL   BUN 18 8 - 23 mg/dL   Creatinine, Ser 0.77 0.61 - 1.24 mg/dL   Calcium 7.9 (L) 8.9 - 10.3 mg/dL   GFR, Estimated >60 >60 mL/min   Anion gap 12 5 - 15  Basic metabolic panel  Result Value Ref Range   Sodium 131 (L) 135 - 145 mmol/L   Potassium 3.1 (L) 3.5 - 5.1 mmol/L   Chloride 92 (L) 98 - 111 mmol/L   CO2 29 22 - 32 mmol/L   Glucose, Bld 105 (H) 70 - 99 mg/dL   BUN 18 8 - 23 mg/dL   Creatinine, Ser 0.71 0.61 - 1.24 mg/dL   Calcium 7.7 (L) 8.9 - 10.3 mg/dL   GFR, Estimated >60 >60 mL/min   Anion gap 10 5 - 15  Procalcitonin - Baseline  Result Value Ref Range   Procalcitonin <0.10 ng/mL  Brain natriuretic peptide  Result Value Ref Range   B Natriuretic Peptide 416.3 (H) 0.0 - 100.0 pg/mL  Procalcitonin  Result Value  Ref Range   Procalcitonin <0.10 ng/mL  CBC  Result Value Ref Range   WBC 4.5 4.0 - 10.5 K/uL   RBC 3.96 (L) 4.22 - 5.81 MIL/uL   Hemoglobin 11.5 (L) 13.0 - 17.0 g/dL   HCT 34.4 (L) 39.0 - 52.0 %   MCV 86.9 80.0 - 100.0 fL   MCH 29.0 26.0 - 34.0 pg   MCHC 33.4 30.0 - 36.0 g/dL   RDW 13.9 11.5 - 15.5 %    Platelets 192 150 - 400 K/uL   nRBC 0.0 0.0 - 0.2 %  Basic metabolic panel  Result Value Ref Range   Sodium 134 (L) 135 - 145 mmol/L   Potassium 3.3 (L) 3.5 - 5.1 mmol/L   Chloride 95 (L) 98 - 111 mmol/L   CO2 31 22 - 32 mmol/L   Glucose, Bld 117 (H) 70 - 99 mg/dL   BUN 21 8 - 23 mg/dL   Creatinine, Ser 0.74 0.61 - 1.24 mg/dL   Calcium 8.1 (L) 8.9 - 10.3 mg/dL   GFR, Estimated >60 >60 mL/min   Anion gap 8 5 - 15  Procalcitonin  Result Value Ref Range   Procalcitonin <0.10 ng/mL  Basic metabolic panel  Result Value Ref Range   Sodium 136 135 - 145 mmol/L   Potassium 4.3 3.5 - 5.1 mmol/L   Chloride 97 (L) 98 - 111 mmol/L   CO2 30 22 - 32 mmol/L   Glucose, Bld 108 (H) 70 - 99 mg/dL   BUN 25 (H) 8 - 23 mg/dL   Creatinine, Ser 0.79 0.61 - 1.24 mg/dL   Calcium 8.1 (L) 8.9 - 10.3 mg/dL   GFR, Estimated >60 >60 mL/min   Anion gap 9 5 - 15      Assessment & Plan:   Problem List Items Addressed This Visit     Knee pain, chronic   Lewy body dementia without behavioral disturbance (HCC) - Primary   Osteoarthritis of multiple joints   Other Visit Diagnoses     Lymphedema          HFU S/p influenza Generalized weakness Osteoarthritis L knee advance  Discharged from SNF to home 1 week ago Now on [facility] orders  He needs assistance with activities of daily living still and has generalized weakness, improving with therapy.  Caregiver Support  I will talk with CCM Team - Pam about caregiver assistance for evenings. Hopefully she can assist in locating a service or person/company that can help.  Please call [facility] to see if they can arrange a follow-up for the Left knee, and discuss GEL Injections into the Knee for arthritis pain relief and function. If needed I can submit referral  Keep on the Hydrocodone as needed, okay to skip a dose on occasion, to avoid dependence. But I do believe the pain relief benefit outweighs the risk.  Lymphedema Followed by  [facility] on compression, has upcoming visit for other compression device fitting.   No orders of the defined types were placed in this encounter.   Follow up plan: Return in about 4 months (around 07/03/2022) for 4 month follow-up as needed for knee pain, medication refills updates.   [name], [facility] 03/04/2022, 2:33 PM

## 2022-03-04 NOTE — Telephone Encounter (Signed)
Okay to proceed w/ orders.  [name], [facility] 03/04/2022, 1:05 PM

## 2022-03-04 NOTE — Patient Instructions (Addendum)
Thank you for coming to the office today.  I will talk with Pam about caregiver assistance for evenings. Hopefully she can assist in locating a service or person/company that can help.  Please call [facility] to see if they can arrange a follow-up for the Left knee, and discuss GEL Injections into the Knee for arthritis pain relief and function. I am not 100% sure that this is a solution to the problem, they may pursue this as a trial. But usually it is 3 shots to help support knee and improve pain / function, and if not effective, then surgery options are usually reviewed, if surgery is not available, then there may not be much else from the orthopedic.  [facility] ([facility] Address: [address], [place], [place] [address] Hours:  9AM-5PM Phone: [phone]  Keep on the Hydrocodone as needed, okay to skip a dose here and there, to avoid over dependence.  But I do believe the pain relief benefit outweighs the risk.  Let me know when you need more.  Please schedule a Follow-up Appointment to: Return in about 4 months (around 07/03/2022) for 4 month follow-up as needed for knee pain, medication refills updates.  If you have any other questions or concerns, please feel free to call the office or send a message through [facility]. You may also schedule an earlier appointment if necessary.  Additionally, you may be receiving a survey about your experience at our office within a few days to 1 week by e-mail or mail. We value your feedback.  [name], DO [facility]

## 2022-03-05 NOTE — Telephone Encounter (Signed)
Message left on Stephanie's machine letting her know of orders to proceed per Dr. [name].

## 2022-03-12 ENCOUNTER — Ambulatory Visit (INDEPENDENT_AMBULATORY_CARE_PROVIDER_SITE_OTHER): Payer: Medicare Other

## 2022-03-12 ENCOUNTER — Telehealth: Payer: Medicare Other

## 2022-03-12 DIAGNOSIS — R413 Other amnesia: Secondary | ICD-10-CM

## 2022-03-12 DIAGNOSIS — M159 Polyosteoarthritis, unspecified: Secondary | ICD-10-CM

## 2022-03-12 DIAGNOSIS — I10 Essential (primary) hypertension: Secondary | ICD-10-CM

## 2022-03-12 DIAGNOSIS — G8929 Other chronic pain: Secondary | ICD-10-CM

## 2022-03-12 DIAGNOSIS — F0280 Dementia in other diseases classified elsewhere without behavioral disturbance: Secondary | ICD-10-CM

## 2022-03-12 NOTE — Patient Instructions (Signed)
Please call the care guide team at [phone] if you need to cancel or reschedule your appointment.   If you are experiencing a Mental Health or [place] or need someone to talk to, please call the Suicide and Crisis Lifeline: 988 call the [place] National Suicide Prevention Lifeline: 325-798-4968 or TTY: (818)754-0260 TTY 380 525 1248) to talk to a trained counselor call 1-800-273-TALK (toll free, 24 hour hotline)   Following is a copy of the CCM Program Consent:  CCM service includes personalized support from designated clinical staff supervised by the physician, including individualized plan of care and coordination with other care providers 24/7 contact phone numbers for assistance for urgent and routine care needs. Service will only be billed when office clinical staff spend 20 minutes or more in a month to coordinate care. Only one practitioner may furnish and bill the service in a calendar month. The patient may stop CCM services at amy time (effective at the end of the month) by phone call to the office staff. The patient will be responsible for cost sharing (co-pay) or up to 20% of the service fee (after annual deductible is met)  Following is a copy of your full provider care plan:   Goals Addressed             This Visit's Progress    CCM Expected Outcome:  Monitor, Self-Manage and Reduce Symptoms of  Lewy Body Dementia       Current Barriers:  Knowledge Deficits related to the progression of Lewy body dementia and impact it has on health and well being and impact on relationships of caregivers that work with the patient Care Coordination needs related to caregiver needs and special needs of the patient in a patient with advanced Lewy Body Dementia Chronic Disease Management support and education needs related to effective management of Lewy body dementia  Cognitive Deficits  Planned Interventions: Evaluation of current treatment plan related to management of  Lewy body dementia and patient's adherence to plan as established by provider.The patient was in the hospital from 01-31-2022 to 02-08-2022. The patient went to rehab and is now back home. The patient is requiring 24/7 care now. The daughter is with him during the night and has to get up sometimes several times with the patient. Sometimes it is not as bad. She has care for the patient during the day. Will send resources by mail to the patients daughter for resources in [place]. Advised patient to call the office for changes in mood, anxiety, depression, or decreased memory loss Provided education to patient re: changes that occur over the course of time with Lewy body dementia and to monitor for deficits and decline in the patients ability to perform ADLS Reviewed medications with patient and discussed compliance. Works with [place] D for effective management of medications. The family and the patients daughter manage his medications. He is taking as directed.   Provided patient with fall and safety information educational materials related to preventing falls and safety precautions in patient with Lewy body dementia Reviewed scheduled/upcoming provider appointments including saw the pcp on 03-04-2022. The daughter knows to call for changes or new needs.  Discussed plans with patient for ongoing care management follow up and provided patient with direct contact information for care management team Advised patient to discuss new onset of sx and sx of cold and changes in the patient mobility and memory with provider Screening for signs and symptoms of depression related to chronic disease state  Assessed social  determinant of health barriers  Symptom Management: Take medications as prescribed   Attend all scheduled provider appointments Call provider office for new concerns or questions  call the Suicide and Crisis Lifeline: 988 call the [place] National Suicide Prevention Lifeline: 3136741169 or  TTY: 754-550-5011 TTY [phone]) to talk to a trained counselor call 1-800-273-TALK (toll free, 24 hour hotline) if experiencing a Mental Health or [place]   Follow Up Plan: Telephone follow up appointment with care management team member scheduled for: 04-23-2022 at 0900 am       CCM Expected Outcome:  Monitor, Self-Manage and Reduce Symptoms of  Osteoarthritis joint pain       Current Barriers:  Knowledge Deficits related to how to effectively manage pain and prevent falls with injuries in patient with OA Care Coordination needs related to evaluation of pain in a patient with OA with advanced Lewy body dementia Chronic Disease Management support and education needs related to effective management of OA with patient with memory deficits  Cognitive Deficits  Planned Interventions: Reviewed provider established plan for pain management. The patient is stable with pain. The pcp has recommended the patient get gel injections in his knees to see if that will help with pain relief. The patient has pain medications and this is being managed by a paid caregiver that stays with the patient during the day. Education and support given.  Discussed importance of adherence to all scheduled medical appointments. Pending referral to ortho for additional recommendations and treatment options ; Counseled on the importance of reporting any/all new or changed pain symptoms or management strategies to pain management provider. Education and review given; Advised patient to report to care team affect of pain on daily activities; Discussed use of relaxation techniques and/or diversional activities to assist with pain reduction (distraction, imagery, relaxation, massage, acupressure, TENS, heat, and cold application. Did discuss trying heat application to see if this would help the patient. The daughter states they have not tried this.  Reviewed with patient prescribed pharmacological and  nonpharmacological pain relief strategies. The patient has paid caregiver during the day and he tries to work with him on exercises that the PT did with the patient when they were working with him. The patient lots of times will not do this due to the pain level and increased pain level; Advised patient to discuss unresolved pain, changes in level or intensity of pain, and safety and falls prevention with provider; Review of measures to keep the patient safe and preventing falls. The patient is at high risk for falls due to impaired mobility and history or falls. Has worked with PT in the past. The daughter denies any new falls with the patient. He is staying in the wheelchair a lot. Education and support given.  Symptom Management: Take medications as prescribed   Attend all scheduled provider appointments Call pharmacy for medication refills 3-7 days in advance of running out of medications call the Suicide and Crisis Lifeline: 988 call the [place] National Suicide Prevention Lifeline: 251-262-5162 or TTY: 705 209 7059 TTY (646)650-7481) to talk to a trained counselor call 1-800-273-TALK (toll free, 24 hour hotline) if experiencing a Mental Health or [place]   Follow Up Plan: Telephone follow up appointment with care management team member scheduled for: 04-23-2022 at 0900 am       CCM Expected Outcome:  Monitor, Self-Manage, and Reduce Symptoms of Hypertension       Current Barriers:  Knowledge Deficits related to importance of normalized blood pressures  in the patient with lewy body dementia and HTN Care Coordination needs related to taking blood pressures on a regular basis and calling the office for changes in blood pressures in a patient with HTN and Lewy body dementia Chronic Disease Management support and education needs related to effective management of HTN Cognitive Deficits  BP Readings from Last 3 Encounters:  03/04/22 122/86  02/08/22 (!) 155/79  01/23/22 (!)  151/91     Planned Interventions: Evaluation of current treatment plan related to hypertension self management and patient's adherence to plan as established by provider. Blood pressures are currently more stable. The patient was recently in the hospital for Sepsis from the flu and pneumonia. The patient is home now and is stable. The patient is requiring 24/7 care now.  Provided education to patient re: stroke prevention, s/s of heart attack and stroke; Reviewed prescribed diet heart healthy. The patient is eating better and is on a more consistent schedule with his eating patterns. The patients grandson in law is with him during the day and helps with meeting his needs.  Reviewed medications with patient and discussed importance of compliance. States compliance with medications;  Discussed plans with patient for ongoing care management follow up and provided patient with direct contact information for care management team; Advised patient, providing education and rationale, to monitor blood pressure daily and record, calling PCP for findings outside established parameters;  Reviewed scheduled/upcoming provider appointments including: Saw pcp on 03-04-2022 post discharge from the hospital. Sees pcp on a regular basis. Knows to call for changes.  Advised patient to discuss changes in blood pressures or heart health with provider; Provided education on prescribed diet heart healthy;  Discussed complications of poorly controlled blood pressure such as heart disease, stroke, circulatory complications, vision complications, kidney impairment, sexual dysfunction;  Screening for signs and symptoms of depression related to chronic disease state;  Assessed social determinant of health barriers;   Symptom Management: Take medications as prescribed   Attend all scheduled provider appointments Call provider office for new concerns or questions  call the Suicide and Crisis Lifeline: 988 call the [place]  National Suicide Prevention Lifeline: 867 057 0874 or TTY: 262-279-8330 TTY (574)610-3779) to talk to a trained counselor call 1-800-273-TALK (toll free, 24 hour hotline) if experiencing a Mental Health or [place]  check blood pressure 3 times per week write blood pressure results in a log or diary learn about high blood pressure keep a blood pressure log call doctor for signs and symptoms of high blood pressure develop an action plan for high blood pressure keep all doctor appointments take medications for blood pressure exactly as prescribed report new symptoms to your doctor eat more whole grains, fruits and vegetables, lean meats and healthy fats  Follow Up Plan: Telephone follow up appointment with care management team member scheduled for: 04-23-2022  at 0900 am          Patient verbalizes understanding of instructions and care plan provided today and agrees to view in [place]. Active MyChart status and patient understanding of how to access instructions and care plan via MyChart confirmed with patient.     Telephone follow up appointment with care management team member scheduled for: 04-23-2022 at 0900 am

## 2022-03-12 NOTE — Chronic Care Management (AMB) (Signed)
Chronic Care Management   CCM RN Visit Note  03/12/2022 Name: Randy [name] MRN: [id number] DOB: [birth year]  Subjective: Randy [name] is a 88 y.o. year old male who is a primary care patient of Randy Hauser, DO. The patient was referred to the Chronic Care Management team for assistance with care management needs subsequent to provider initiation of CCM services and plan of care.    Today's Visit:   Spoke to the patients daughter, [name] who is also DRP  for follow up visit.        Goals Addressed             This Visit's Progress    CCM Expected Outcome:  Monitor, Self-Manage and Reduce Symptoms of  Lewy Body Dementia       Current Barriers:  Knowledge Deficits related to the progression of Lewy body dementia and impact it has on health and well being and impact on relationships of caregivers that work with the patient Care Coordination needs related to caregiver needs and special needs of the patient in a patient with advanced Lewy Body Dementia Chronic Disease Management support and education needs related to effective management of Lewy body dementia  Cognitive Deficits  Planned Interventions: Evaluation of current treatment plan related to management of Lewy body dementia and patient's adherence to plan as established by provider.The patient was in the hospital from 01-31-2022 to 02-08-2022. The patient went to rehab and is now back home. The patient is requiring 24/7 care now. The daughter is with him during the night and has to get up sometimes several times with the patient. Sometimes it is not as bad. She has care for the patient during the day. Will send resources by mail to the patients daughter for resources in [place]. Advised patient to call the office for changes in mood, anxiety, depression, or decreased memory loss Provided education to patient re: changes that occur over the course of time with Lewy body dementia and to monitor for deficits and decline  in the patients ability to perform ADLS Reviewed medications with patient and discussed compliance. Works with [place] D for effective management of medications. The family and the patients daughter manage his medications. He is taking as directed.   Provided patient with fall and safety information educational materials related to preventing falls and safety precautions in patient with Lewy body dementia Reviewed scheduled/upcoming provider appointments including saw the pcp on 03-04-2022. The daughter knows to call for changes or new needs.  Discussed plans with patient for ongoing care management follow up and provided patient with direct contact information for care management team Advised patient to discuss new onset of sx and sx of cold and changes in the patient mobility and memory with provider Screening for signs and symptoms of depression related to chronic disease state  Assessed social determinant of health barriers  Symptom Management: Take medications as prescribed   Attend all scheduled provider appointments Call provider office for new concerns or questions  call the Suicide and Crisis Lifeline: 988 call the [place] National Suicide Prevention Lifeline: (956) 424-7910 or TTY: 780-255-6931 TTY (530) 620-4566) to talk to a trained counselor call 1-800-273-TALK (toll free, 24 hour hotline) if experiencing a Mental Health or [place]   Follow Up Plan: Telephone follow up appointment with care management team member scheduled for: 04-23-2022 at 0900 am       CCM Expected Outcome:  Monitor, Self-Manage and Reduce Symptoms of  Osteoarthritis joint pain  Current Barriers:  Knowledge Deficits related to how to effectively manage pain and prevent falls with injuries in patient with OA Care Coordination needs related to evaluation of pain in a patient with OA with advanced Lewy body dementia Chronic Disease Management support and education needs related to effective  management of OA with patient with memory deficits  Cognitive Deficits  Planned Interventions: Reviewed provider established plan for pain management. The patient is stable with pain. The pcp has recommended the patient get gel injections in his knees to see if that will help with pain relief. The patient has pain medications and this is being managed by a paid caregiver that stays with the patient during the day. Education and support given.  Discussed importance of adherence to all scheduled medical appointments. Pending referral to ortho for additional recommendations and treatment options ; Counseled on the importance of reporting any/all new or changed pain symptoms or management strategies to pain management provider. Education and review given; Advised patient to report to care team affect of pain on daily activities; Discussed use of relaxation techniques and/or diversional activities to assist with pain reduction (distraction, imagery, relaxation, massage, acupressure, TENS, heat, and cold application. Did discuss trying heat application to see if this would help the patient. The daughter states they have not tried this.  Reviewed with patient prescribed pharmacological and nonpharmacological pain relief strategies. The patient has paid caregiver during the day and he tries to work with him on exercises that the PT did with the patient when they were working with him. The patient lots of times will not do this due to the pain level and increased pain level; Advised patient to discuss unresolved pain, changes in level or intensity of pain, and safety and falls prevention with provider; Review of measures to keep the patient safe and preventing falls. The patient is at high risk for falls due to impaired mobility and history or falls. Has worked with PT in the past. The daughter denies any new falls with the patient. He is staying in the wheelchair a lot. Education and support given.  Symptom  Management: Take medications as prescribed   Attend all scheduled provider appointments Call pharmacy for medication refills 3-7 days in advance of running out of medications call the Suicide and Crisis Lifeline: 988 call the [place] National Suicide Prevention Lifeline: (986)578-2155 or TTY: 620-609-0441 TTY 3056504484) to talk to a trained counselor call 1-800-273-TALK (toll free, 24 hour hotline) if experiencing a Mental Health or [place]   Follow Up Plan: Telephone follow up appointment with care management team member scheduled for: 04-23-2022 at 0900 am       CCM Expected Outcome:  Monitor, Self-Manage, and Reduce Symptoms of Hypertension       Current Barriers:  Knowledge Deficits related to importance of normalized blood pressures in the patient with lewy body dementia and HTN Care Coordination needs related to taking blood pressures on a regular basis and calling the office for changes in blood pressures in a patient with HTN and Lewy body dementia Chronic Disease Management support and education needs related to effective management of HTN Cognitive Deficits  BP Readings from Last 3 Encounters:  03/04/22 122/86  02/08/22 (!) 155/79  01/23/22 (!) 151/91     Planned Interventions: Evaluation of current treatment plan related to hypertension self management and patient's adherence to plan as established by provider. Blood pressures are currently more stable. The patient was recently in the hospital for Sepsis from the flu  and pneumonia. The patient is home now and is stable. The patient is requiring 24/7 care now.  Provided education to patient re: stroke prevention, s/s of heart attack and stroke; Reviewed prescribed diet heart healthy. The patient is eating better and is on a more consistent schedule with his eating patterns. The patients grandson in law is with him during the day and helps with meeting his needs.  Reviewed medications with patient and discussed  importance of compliance. States compliance with medications;  Discussed plans with patient for ongoing care management follow up and provided patient with direct contact information for care management team; Advised patient, providing education and rationale, to monitor blood pressure daily and record, calling PCP for findings outside established parameters;  Reviewed scheduled/upcoming provider appointments including: Saw pcp on 03-04-2022 post discharge from the hospital. Sees pcp on a regular basis. Knows to call for changes.  Advised patient to discuss changes in blood pressures or heart health with provider; Provided education on prescribed diet heart healthy;  Discussed complications of poorly controlled blood pressure such as heart disease, stroke, circulatory complications, vision complications, kidney impairment, sexual dysfunction;  Screening for signs and symptoms of depression related to chronic disease state;  Assessed social determinant of health barriers;   Symptom Management: Take medications as prescribed   Attend all scheduled provider appointments Call provider office for new concerns or questions  call the Suicide and Crisis Lifeline: 988 call the [place] National Suicide Prevention Lifeline: (916)273-6056 or TTY: (276)849-3567 TTY (607)112-0816) to talk to a trained counselor call 1-800-273-TALK (toll free, 24 hour hotline) if experiencing a Mental Health or [place]  check blood pressure 3 times per week write blood pressure results in a log or diary learn about high blood pressure keep a blood pressure log call doctor for signs and symptoms of high blood pressure develop an action plan for high blood pressure keep all doctor appointments take medications for blood pressure exactly as prescribed report new symptoms to your doctor eat more whole grains, fruits and vegetables, lean meats and healthy fats  Follow Up Plan: Telephone follow up appointment  with care management team member scheduled for: 04-23-2022  at 0900 am          Plan:Telephone follow up appointment with care management team member scheduled for:  04-23-2022 at 0900 am  Noreene Larsson RN, MSN, CCM RN Care Manager  Chronic Care Management Direct Number: [phone]

## 2022-03-22 ENCOUNTER — Ambulatory Visit: Payer: Medicare Other | Admitting: Pharmacist

## 2022-03-22 DIAGNOSIS — I10 Essential (primary) hypertension: Secondary | ICD-10-CM

## 2022-03-22 DIAGNOSIS — R413 Other amnesia: Secondary | ICD-10-CM

## 2022-03-22 DIAGNOSIS — G8929 Other chronic pain: Secondary | ICD-10-CM

## 2022-03-22 NOTE — Patient Instructions (Signed)
Visit Information  Thank you for taking time to visit with me today. Please don't hesitate to contact me if I can be of assistance to you before our next scheduled telephone appointment.  Following are the goals we discussed today:   Goals Addressed             This Visit's Progress    Pharmacy Goals       Please continue to monitor home blood pressure and keep log of results and have this record to review during our next appointment.   Feel free to call me with any questions or concerns. I look forward to our next call!   Wallace Cullens, PharmD, [place] [phone]         Our next appointment is by telephone on 06/21/2022 at 8:30 am  Please call the care guide team at [phone] if you need to cancel or reschedule your appointment.    Patient verbalizes understanding of instructions and care plan provided today and agrees to view in [place]. Active MyChart status and patient understanding of how to access instructions and care plan via MyChart confirmed with patient.

## 2022-03-22 NOTE — Chronic Care Management (AMB) (Signed)
Chronic Care Management CCM Pharmacy Note  03/22/2022 Name:  Randy [name] MRN:  [id number] DOB:  [birth year]   Subjective: Randy [name] is an 88 y.o. year old male who is a primary patient of [name], DO.  The CCM team was consulted for assistance with disease management and care coordination needs.    Engaged with patient's daughter/caregiver by telephone for follow up visit for pharmacy case management and/or care coordination services.   Objective:  Medications Reviewed Today     Reviewed by [name], RPH-CPP (Pharmacist) on 03/22/22 at 1010  Med List Status: <None>   Medication Order Taking? Sig Documenting Provider Last Dose Status Informant  acetaminophen (TYLENOL) 500 MG tablet [id number] Yes Take 1,000 mg by mouth every 8 (eight) hours as needed. [provider] Taking Active Child, Pharmacy Records           Med Note Lajean Saver Jan 31, 2022  2:18 PM) Patient's daughter stated "scheduled"  carbidopa-levodopa (SINEMET CR) 50-200 MG tablet [id number] Yes Take 1 tablet by mouth 4 (four) times daily. [provider] Taking Active Child, Pharmacy Records           Med Note Lajean Saver Jan 31, 2022  2:20 PM) Patient takes with meals and bedtime (breakfast, lunch, dinner and bedtime)  cholecalciferol (VITAMIN D) 1000 units tablet [id number] Yes Take 1,000 Units by mouth daily. [provider] Taking Active Child, Pharmacy Records  Coenzyme Q10 (CO Q 10) 100 MG CAPS [id number] Yes Take 200 mg by mouth daily. [provider] Taking Active Child, Pharmacy Records  diclofenac Sodium (VOLTAREN) 1 % GEL [id number] No APPLY 2 G TOPICALLY 4 (FOUR) TIMES DAILY AS NEEDED. TO KNEE ARTHRITIS PAIN  Patient not taking: Reported on 02/26/2022   [name], DO Not Taking Active Child, Pharmacy Records  enalapril (VASOTEC) 5 MG tablet [id number] Yes TAKE 1 TABLET (5 MG TOTAL) BY MOUTH DAILY.  [name], DO Taking Active Child, Pharmacy Records  furosemide (LASIX) 20 MG tablet [id number] Yes Take 1 tablet (20 mg total) by mouth every other day.  Patient taking differently: Take 20 mg by mouth every other day. Patient takes Mondays, Wednesdays, and Fridays.   Kate Sable, MD Taking Active Child, Pharmacy Records           Med Note Briant Cedar, [facility]   Sat Jul 28, 2021 10:49 PM) Last filled 07/06/21 90 day supply  HYDROcodone-acetaminophen (NORCO/VICODIN) 5-325 MG tablet [id number] Yes Take 1 tablet by mouth every 6 (six) hours as needed for moderate pain. Jennye Boroughs, MD Taking Active   melatonin 5 MG TABS [id number]  Take 1 tablet (5 mg total) by mouth at bedtime. Lorella Nimrod, MD  Active Child, Pharmacy Records  Multiple Vitamin (MULTIVITAMIN WITH MINERALS) TABS tablet [id number]  Take 1 tablet by mouth daily. [name], MD  Active Child, Pharmacy Records  Omega-3 Fatty Acids (FISH OIL) 1200 MG CAPS [id number]  Take 1,200 mg by mouth in the morning, at noon, in the evening, and at bedtime. [provider]  Active Child, Pharmacy Records           Med Note Lajean Saver Jan 31, 2022  2:21 PM) Patient takes at least 3 times daily (having some difficulties with swallowing at this time)  omeprazole (PRILOSEC) 20 MG capsule [id number]  Take 20 mg by mouth 2 (two) times daily. [provider]  Active  Child, Pharmacy Records  ondansetron (ZOFRAN-ODT) 4 MG disintegrating tablet [id number]  Take 1 tablet (4 mg total) by mouth every 8 (eight) hours as needed for nausea or vomiting. [name], DO  Active Child, Pharmacy Records  rivastigmine (EXELON) 3 MG capsule [id number]  Take 3 mg by mouth 2 (two) times daily. [provider]  Active Child, Pharmacy Records           Med Note Briant Cedar, [facility]   Sat Jul 28, 2021 10:50 PM) Last filled 06/01/21 90 day supply  sertraline (ZOLOFT) 50 MG tablet [id number]  Take 50 mg by  mouth daily. [provider]  Active Child, Pharmacy Records           Med Note Briant Cedar, [facility]   Sat Jul 28, 2021 10:51 PM) Last filled 06/17/21 90 day supply  vitamin C (ASCORBIC ACID) 500 MG tablet [id number]  Take 500 mg by mouth daily. [provider]  Active Child, Pharmacy Records            Pertinent Labs:  Lab Results  Component Value Date   HGBA1C 5.3 07/28/2021   Lab Results  Component Value Date   CREATININE 0.79 02/07/2022   BUN 25 (H) 02/07/2022   NA 136 02/07/2022   K 4.3 02/07/2022   CL 97 (L) 02/07/2022   CO2 30 02/07/2022   BP Readings from Last 3 Encounters:  03/04/22 122/86  02/08/22 (!) 155/79  01/23/22 (!) 151/91   Pulse Readings from Last 3 Encounters:  03/04/22 77  02/08/22 70  01/23/22 78     SDOH:  (Social Determinants of Health) assessments and interventions performed:  SDOH Interventions    Flowsheet Row Clinical Support from 10/05/2021 in [place] Coordination from 08/13/2021 in [place] Management from 10/09/2020 in [facility]  SDOH Interventions     Food Insecurity Interventions Intervention Not Indicated -- Intervention Not Indicated  Housing Interventions Intervention Not Indicated -- --  Transportation Interventions Intervention Not Indicated, Patient Resources (Friends/Family) Intervention Not Indicated, Other (Comment)  [will need updated handicap placecard, expires 12-2021] Intervention Not Indicated  Utilities Interventions Intervention Not Indicated -- --  Alcohol Usage Interventions Intervention Not Indicated (Score <7) -- --  Financial Strain Interventions Intervention Not Indicated -- Intervention Not Indicated  Physical Activity Interventions Patient Refused Other (Comments)  [patient currently in rehab facility after a fall] --  Stress Interventions Intervention Not Indicated -- --   Social Connections Interventions Intervention Not Indicated -- Intervention Not Indicated       CCM Care Plan  Review of patient past medical history, allergies, medications, health status, including review of consultants reports, laboratory and other test data, was performed as part of comprehensive evaluation and provision of chronic care management services.   Care Plan : PharmD - Medication Managment  Updates made by [name], RPH-CPP since 03/22/2022 12:00 AM     Problem: Disease Progression      Long-Range Goal: Disease Progression Prevented or Minimized   Start Date: 01/19/2020  Expected End Date: 04/18/2020  Recent Progress: On track  Priority: High  Note:   Current Barriers:  Chronic Disease Management support, education, and care coordination needs related to hypertension, osteoarthritis, hyperlipidemia, insomnia and memory loss Cognitive Deficits  Pharmacist Clinical Goal(s):  Over the next 90 days, caregiver/patient will adhere to plan to optimize therapeutic regimen for hypertension as evidenced by report of adherence  to recommended medication management changes through collaboration with PharmD and provider.   Interventions: 1:1 collaboration with [name], DO regarding development and update of comprehensive plan of care as evidenced by provider attestation and co-signature Inter-disciplinary care team collaboration (see longitudinal plan of care) Perform chart review Patient seen for Office Visit with PCP on 2/26 Today states biggest concern is patient having difficulty with standing related to his left knee Advise caregiver to call to schedule appointment with [facility] as recommended by PCP for follow up of his left knee - phone number provided States son-in-law, Gerald Stabs, continuing to provide care to patient throughout the day, including preparing meals for patient and aiding with medication administration Reports patient  receiving physical therapy once/week - working on strengthening and patient also doing his exercises with caregiver on the other days Reports patient taking hydrocodone as needed as recommended by PCP. Continues to have patient sit or lay down after taking  Hypertension Reports patient taking: Enalapril 5 mg daily in the morning Furosemide 20 mg on Mondays, Wednesdays and Fridays Reports denies monitoring home BP recently Patient uses of zippered compression socks and to elevate legs throughout the day Encourage caregiver to keep record when monitors home BP, keep log, bring this record to medical appointments and contact office for readings outside of established parameters or symptoms    Medication Adherence Note patient using weekly pillbox with slots for four times daily dosing Reports son-in-law assisting with remembering four times/day dosing Have discussed with caregiver additional strategies for using medication alarm as adherence aid   Patient Goals/Self-Care Activities Over the next 90 days, caregiver/patient will:  - take medications as prescribed - check blood pressure, document, and provide at future appointments  Follow Up Plan: Telephone follow up appointment with care management team member scheduled for: 06/21/2022 at 8:30 am      Wallace Cullens, PharmD, Para [place], [place] [phone]

## 2022-04-07 DIAGNOSIS — F0280 Dementia in other diseases classified elsewhere without behavioral disturbance: Secondary | ICD-10-CM | POA: Diagnosis not present

## 2022-04-07 DIAGNOSIS — M159 Polyosteoarthritis, unspecified: Secondary | ICD-10-CM

## 2022-04-07 DIAGNOSIS — I10 Essential (primary) hypertension: Secondary | ICD-10-CM

## 2022-04-07 DIAGNOSIS — G3183 Dementia with Lewy bodies: Secondary | ICD-10-CM | POA: Diagnosis not present

## 2022-04-16 ENCOUNTER — Telehealth: Payer: Self-pay

## 2022-04-16 ENCOUNTER — Other Ambulatory Visit: Payer: Self-pay | Admitting: Family Medicine

## 2022-04-16 ENCOUNTER — Telehealth: Payer: Self-pay | Admitting: Family Medicine

## 2022-04-16 DIAGNOSIS — M159 Polyosteoarthritis, unspecified: Secondary | ICD-10-CM

## 2022-04-16 DIAGNOSIS — G8929 Other chronic pain: Secondary | ICD-10-CM

## 2022-04-16 DIAGNOSIS — Z515 Encounter for palliative care: Secondary | ICD-10-CM

## 2022-04-16 MED ORDER — HYDROCODONE-ACETAMINOPHEN 5-325 MG PO TABS: 1.0000 | ORAL_TABLET | Freq: Four times a day (QID) | ORAL | 0 refills | Status: DC | PRN

## 2022-04-16 NOTE — Transitions of Care (Post Inpatient/ED Visit) (Signed)
   04/16/2022  Name: Randy [name] MRN: [id number] DOB: [birth year]  Today's TOC FU Call Status: Today's TOC FU Call Status:: Unsuccessul Call (1st Attempt) Unsuccessful Call (1st Attempt) Date: 04/16/22  Attempted to reach the patient regarding the most recent Inpatient/ED visit.  Follow Up Plan: Additional outreach attempts will be made to reach the patient to complete the Transitions of Care (Post Inpatient/ED visit) call.   Agnes Lawrence, CMA (AAMA)  [facility] [phone]

## 2022-04-16 NOTE — Telephone Encounter (Signed)
Called Randy [name], they attempted to fill the Hydrocodone-Acetaminophen 6 pill rx that was ordered by ED doctor in Feb.  It should be 120 pills for his maintenance pill count that can last up to 3 months.  I sent correct pill count order just now.  Saralyn Pilar, DO [facility] 04/16/2022, 4:49 PM

## 2022-04-16 NOTE — Telephone Encounter (Signed)
Medication Refill - Medication: HYDROcodone-acetaminophen (NORCO/VICODIN) 5-325 MG tablet   Has the patient contacted their pharmacy? Yes.   (Agent: If no, request that the patient contact the pharmacy for the refill. If patient does not wish to contact the pharmacy document the reason why and proceed with request.) (Agent: If yes, when and what did the pharmacy advise?)  Preferred Pharmacy (with phone number or street name):  [id number] - [place], [place] - [address]  [address] [place] [address]  Phone: [phone] Fax: [phone]   Has the patient been seen for an appointment in the last year OR does the patient have an upcoming appointment? Yes.    Agent: Please be advised that RX refills may take up to 3 business days. We ask that you follow-up with your pharmacy.

## 2022-04-16 NOTE — Telephone Encounter (Signed)
Please review.  Do you prescribe this?   Thanks,   -Vernona Rieger

## 2022-04-16 NOTE — Telephone Encounter (Signed)
Pt's daughter is calling in because she called earlier to get pt's HYDROcodone-acetaminophen (NORCO/VICODIN) 5-325 MG tablet [[id number]] refilled and found out pt was only prescribed 6 pills. Velna Hatchet would like to know why he was only prescribed six pills and if there is anything they needed to do. Please advise.

## 2022-04-16 NOTE — Telephone Encounter (Signed)
Requested medication (s) are due for refill today - yes  Requested medication (s) are on the active medication list -yes  Future visit scheduled -no  Last refill: 02/08/22 #6  Notes to clinic: non delegated Rx  Requested Prescriptions  Pending Prescriptions Disp Refills   HYDROcodone-acetaminophen (NORCO/VICODIN) 5-325 MG tablet 6 tablet 0    Sig: Take 1 tablet by mouth every 6 (six) hours as needed for moderate pain.     Not Delegated - Analgesics:  Opioid Agonist Combinations Failed - 04/16/2022  8:12 AM      Failed - This refill cannot be delegated      Failed - Urine Drug Screen completed in last 360 days      Passed - Valid encounter within last 3 months    Recent Outpatient Visits           1 month ago Lewy body dementia without behavioral disturbance [facility])   [place] [facility] [place], Netta Neat, DO   5 months ago Acute maxillary sinusitis, recurrence not specified   [place] [facility] Mecum, [place] E, [place]   5 months ago Chronic pain of left knee   [place] [facility] [name], DO   1 year ago Nausea and vomiting, unspecified vomiting type   [place] [facility] [place], Salvadore Oxford, NP   1 year ago Acute non-recurrent frontal sinusitis   [place] [facility] [name], DO       Future Appointments             In 3 months Richardo Hanks, Laurette Schimke, MD [facility] [place]               Requested Prescriptions  Pending Prescriptions Disp Refills   HYDROcodone-acetaminophen (NORCO/VICODIN) 5-325 MG tablet 6 tablet 0    Sig: Take 1 tablet by mouth every 6 (six) hours as needed for moderate pain.     Not Delegated - Analgesics:  Opioid Agonist Combinations Failed - 04/16/2022  8:12 AM      Failed - This refill cannot be delegated      Failed - Urine Drug Screen completed in last 360 days      Passed - Valid encounter within last 3  months    Recent Outpatient Visits           1 month ago Lewy body dementia without behavioral disturbance [facility])   [place] [facility] [place], Netta Neat, DO   5 months ago Acute maxillary sinusitis, recurrence not specified   [place] [facility] Mecum, [place] E, [place]   5 months ago Chronic pain of left knee   [place] [facility] [name], DO   1 year ago Nausea and vomiting, unspecified vomiting type   [place] [facility] [place], Salvadore Oxford, NP   1 year ago Acute non-recurrent frontal sinusitis   [place] [facility] [name], DO       Future Appointments             In 3 months Richardo Hanks, Laurette Schimke, MD [facility] [place]

## 2022-04-17 NOTE — Transitions of Care (Post Inpatient/ED Visit) (Signed)
   04/17/2022  Name: Randy [name] MRN: [id number] DOB: 1934-03-31  Today's TOC FU Call Status: Today's TOC FU Call Status:: Unsuccessful Call (2nd Attempt) Unsuccessful Call (1st Attempt) Date: 04/16/22 Unsuccessful Call (2nd Attempt) Date: 04/17/22  Attempted to reach the patient regarding the most recent Inpatient/ED visit.  Follow Up Plan: Additional outreach attempts will be made to reach the patient to complete the Transitions of Care (Post Inpatient/ED visit) call.   Agnes Lawrence, CMA (AAMA)  [facility] [phone]

## 2022-04-23 ENCOUNTER — Telehealth: Payer: Medicare Other

## 2022-04-23 ENCOUNTER — Telehealth: Payer: Self-pay

## 2022-04-23 NOTE — Telephone Encounter (Signed)
   CCM RN Visit Note   04-23-2022 Name: ARMARION ZELTNER MRN: [id number]      DOB: 1934/05/22  Subjective: Randy [name] is a 88 y.o. year old male who is a primary care patient of Dr. Althea Charon. The patient was referred to the Chronic Care Management team for assistance with care management needs subsequent to provider initiation of CCM services and plan of care.      An unsuccessful telephone outreach was attempted today to contact the patient about Chronic Care Management needs.    Plan:A HIPAA compliant phone message was left for the patient providing contact information and requesting a return call.  Alto Denver RN, MSN, CCM RN Care Manager  Chronic Care Management Direct Number: [phone]

## 2022-05-01 ENCOUNTER — Telehealth: Payer: Self-pay

## 2022-05-01 NOTE — Progress Notes (Signed)
  Chronic Care Management Note  05/01/2022 Name: HANSFORD HIRT MRN: [id number] DOB: 03-17-1934  CLEARENCE VITUG is a 88 y.o. year old male who is a primary care patient of [name], DO and is actively engaged with the Chronic Care Management team. I reached out to Lillia Carmel by phone today to assist with re-scheduling a follow up visit with the RN Case Manager  Follow up plan: Unsuccessful telephone outreach attempt made. A HIPAA compliant phone message was left for the patient providing contact information and requesting a return call.  The care management team will reach out to the patient again over the next 7 days.  If patient returns call to provider office, please advise to call CCM Care Guide Penne Lash  at [phone]  Penne Lash, RMA Care Guide [facility]  [place], [place] [address] Direct Dial: [phone] [email or web address]

## 2022-05-08 NOTE — Transitions of Care (Post Inpatient/ED Visit) (Signed)
   05/08/2022  Name: Randy [name] MRN: [id number] DOB: 02-May-1934  Today's TOC FU Call Status: Today's TOC FU Call Status:: Unsuccessful Call (3rd Attempt) Unsuccessful Call (1st Attempt) Date: 04/16/22 Unsuccessful Call (2nd Attempt) Date: 04/17/22 Unsuccessful Call (3rd Attempt) Date: 05/08/22  Attempted to reach the patient regarding the most recent Inpatient/ED visit.  Follow Up Plan: No further outreach attempts will be made at this time. We have been unable to contact the patient.  Signature Agnes Lawrence, CMA (AAMA)  [facility] [phone]

## 2022-05-23 ENCOUNTER — Telehealth: Payer: Self-pay | Admitting: Family Medicine

## 2022-05-23 NOTE — Telephone Encounter (Signed)
Patient's daughter called at 4:31 pm and states patient's caregiver informed her patient was dehydrated today. They gave him 2 16 oz bottles of water and he is now working on 24 more oz. He has not been able to urinate. I informed her that he will need to go to the ER if he can't urinate. She will call back tomorrow with an update.

## 2022-05-28 ENCOUNTER — Ambulatory Visit (INDEPENDENT_AMBULATORY_CARE_PROVIDER_SITE_OTHER): Payer: Medicare Other

## 2022-05-28 ENCOUNTER — Telehealth: Payer: Medicare Other

## 2022-05-28 DIAGNOSIS — R413 Other amnesia: Secondary | ICD-10-CM

## 2022-05-28 DIAGNOSIS — I10 Essential (primary) hypertension: Secondary | ICD-10-CM

## 2022-05-28 DIAGNOSIS — M159 Polyosteoarthritis, unspecified: Secondary | ICD-10-CM

## 2022-05-28 DIAGNOSIS — F0280 Dementia in other diseases classified elsewhere without behavioral disturbance: Secondary | ICD-10-CM

## 2022-05-28 NOTE — Patient Instructions (Signed)
Please call the care guide team at [phone] if you need to cancel or reschedule your appointment.   If you are experiencing a Mental Health or Behavioral Health Crisis or need someone to talk to, please call the Suicide and Crisis Lifeline: 988 call the [place] National Suicide Prevention Lifeline: 3614430221 or TTY: 580-716-5707 TTY (217)680-0040) to talk to a trained counselor call 1-800-273-TALK (toll free, 24 hour hotline)   Following is a copy of the CCM Program Consent:  CCM service includes personalized support from designated clinical staff supervised by the physician, including individualized plan of care and coordination with other care providers 24/7 contact phone numbers for assistance for urgent and routine care needs. Service will only be billed when office clinical staff spend 20 minutes or more in a month to coordinate care. Only one practitioner may furnish and bill the service in a calendar month. The patient may stop CCM services at amy time (effective at the end of the month) by phone call to the office staff. The patient will be responsible for cost sharing (co-pay) or up to 20% of the service fee (after annual deductible is met)  Following is a copy of your full provider care plan:   Goals Addressed             This Visit's Progress    CCM Expected Outcome:  Monitor, Self-Manage and Reduce Symptoms of  Lewy Body Dementia       Current Barriers:  Knowledge Deficits related to the progression of Lewy body dementia and impact it has on health and well being and impact on relationships of caregivers that work with the patient Care Coordination needs related to caregiver needs and special needs of the patient in a patient with advanced Lewy Body Dementia Chronic Disease Management support and education needs related to effective management of Lewy body dementia  Cognitive Deficits  Planned Interventions: Evaluation of current treatment plan related to management of  Lewy body dementia and patient's adherence to plan as established by provider.The patients daughter states the patient continues to decline and they are just taking it day by day. Reflective listening and support. Advised patient to call the office for changes in mood, anxiety, depression, or decreased memory loss Provided education to patient re: changes that occur over the course of time with Lewy body dementia and to monitor for deficits and decline in the patients ability to perform ADLS Reviewed medications with patient and discussed compliance. Works with pharm D for effective management of medications. The family and the patients daughter manage his medications. He is taking as directed.   Provided patient with fall and safety information educational materials related to preventing falls and safety precautions in patient with Lewy body dementia. Denies any new falls. States sometimes he may almost miss the chair when transitioning but has not had any falls.  Reviewed scheduled/upcoming provider appointments including No upcoming appointments with the pcp. Knows to call for changes or new needs.  Discussed plans with patient for ongoing care management follow up and provided patient with direct contact information for care management team Advised patient to discuss new onset of sx and sx of cold and changes in the patient mobility and memory with provider Screening for signs and symptoms of depression related to chronic disease state  Assessed social determinant of health barriers  Symptom Management: Take medications as prescribed   Attend all scheduled provider appointments Call provider office for new concerns or questions  call the Suicide and Crisis Lifeline:  988 call the [place] National Suicide Prevention Lifeline: 709-441-3904 or TTY: 770-041-7257 TTY (425)017-5865) to talk to a trained counselor call 1-800-273-TALK (toll free, 24 hour hotline) if experiencing a Mental Health or  Behavioral Health Crisis   Follow Up Plan: Telephone follow up appointment with care management team member scheduled for: 07-16-2022 at 0945 am       CCM Expected Outcome:  Monitor, Self-Manage and Reduce Symptoms of  Osteoarthritis joint pain       Current Barriers:  Knowledge Deficits related to how to effectively manage pain and prevent falls with injuries in patient with OA Care Coordination needs related to evaluation of pain in a patient with OA with advanced Lewy body dementia Chronic Disease Management support and education needs related to effective management of OA with patient with memory deficits  Cognitive Deficits  Planned Interventions: Reviewed provider established plan for pain management. The patient is stable with pain. The patients daughter states that the patient still complains of pain in his legs. She has pain medications and that helps. It varies daily and sometimes it helps better than others. Education on trying heat and cold application and different things to help with the pain and discomfort he is having. This has been an ongoing concern and issue. Will continue to monitor for changes.  Discussed importance of adherence to all scheduled medical appointments. Pending referral to ortho for additional recommendations and treatment options. Has seen the ortho provider.  ; Counseled on the importance of reporting any/all new or changed pain symptoms or management strategies to pain management provider. Education and review given; Advised patient to report to care team affect of pain on daily activities; Discussed use of relaxation techniques and/or diversional activities to assist with pain reduction (distraction, imagery, relaxation, massage, acupressure, TENS, heat, and cold application. Did discuss trying heat application to see if this would help the patient. The daughter states they have not tried this.  Reviewed with patient prescribed pharmacological and  nonpharmacological pain relief strategies. The patient has paid caregiver during the day and he tries to work with him on exercises that the PT did with the patient when they were working with him. The patient lots of times will not do this due to the pain level and increased pain level; Advised patient to discuss unresolved pain, changes in level or intensity of pain, and safety and falls prevention with provider; Review of measures to keep the patient safe and preventing falls. The patient is at high risk for falls due to impaired mobility and history or falls. Has worked with PT in the past. The daughter denies any new falls with the patient. He is staying in the wheelchair a lot. The patients daughter states he is not walking any and that he just pivots from the chair to the potty and bed. Education and support given.  Symptom Management: Take medications as prescribed   Attend all scheduled provider appointments Call pharmacy for medication refills 3-7 days in advance of running out of medications call the Suicide and Crisis Lifeline: 988 call the [place] National Suicide Prevention Lifeline: 812-557-6905 or TTY: 3408025142 TTY 651-744-0172) to talk to a trained counselor call 1-800-273-TALK (toll free, 24 hour hotline) if experiencing a Mental Health or Behavioral Health Crisis   Follow Up Plan: Telephone follow up appointment with care management team member scheduled for: 07-16-2022 at 0945 am       CCM Expected Outcome:  Monitor, Self-Manage, and Reduce Symptoms of Hypertension  Current Barriers:  Knowledge Deficits related to importance of normalized blood pressures in the patient with lewy body dementia and HTN Care Coordination needs related to taking blood pressures on a regular basis and calling the office for changes in blood pressures in a patient with HTN and Lewy body dementia Chronic Disease Management support and education needs related to effective management of  HTN Cognitive Deficits  BP Readings from Last 3 Encounters:  03/04/22 122/86  02/08/22 (!) 155/79  01/23/22 (!) 151/91     Planned Interventions: Evaluation of current treatment plan related to hypertension self management and patient's adherence to plan as established by provider. Blood pressures are currently more stable. The patient is requiring 24/7 care. The patients daughter states that they are just taking things day by day.  Provided education to patient re: stroke prevention, s/s of heart attack and stroke; Reviewed prescribed diet heart healthy. The patient is eating better and is on a more consistent schedule with his eating patterns. The patients grandson in law is with him during the day and helps with meeting his needs.  Reviewed medications with patient and discussed importance of compliance. States compliance with medications;  Discussed plans with patient for ongoing care management follow up and provided patient with direct contact information for care management team; Advised patient, providing education and rationale, to monitor blood pressure daily and record, calling PCP for findings outside established parameters;  Reviewed scheduled/upcoming provider appointments including:  Sees pcp on a regular basis. Knows to call for changes.  Advised patient to discuss changes in blood pressures or heart health with provider; Provided education on prescribed diet heart healthy;  Discussed complications of poorly controlled blood pressure such as heart disease, stroke, circulatory complications, vision complications, kidney impairment, sexual dysfunction;  Screening for signs and symptoms of depression related to chronic disease state;  Assessed social determinant of health barriers;   Symptom Management: Take medications as prescribed   Attend all scheduled provider appointments Call provider office for new concerns or questions  call the Suicide and Crisis Lifeline: 988 call  the [place] National Suicide Prevention Lifeline: (819)826-7122 or TTY: (386)574-9784 TTY 934-127-1242) to talk to a trained counselor call 1-800-273-TALK (toll free, 24 hour hotline) if experiencing a Mental Health or Behavioral Health Crisis  check blood pressure 3 times per week write blood pressure results in a log or diary learn about high blood pressure keep a blood pressure log call doctor for signs and symptoms of high blood pressure develop an action plan for high blood pressure keep all doctor appointments take medications for blood pressure exactly as prescribed report new symptoms to your doctor eat more whole grains, fruits and vegetables, lean meats and healthy fats  Follow Up Plan: Telephone follow up appointment with care management team member scheduled for: 07-16-2022  at 0945 am          Patient verbalizes understanding of instructions and care plan provided today and agrees to view in MyChart. Active MyChart status and patient understanding of how to access instructions and care plan via MyChart confirmed with patient.  Telephone follow up appointment with care management team member scheduled for: 07-16-2022 at 0945 am

## 2022-05-28 NOTE — Chronic Care Management (AMB) (Signed)
Chronic Care Management   CCM RN Visit Note  05/28/2022 Name: Randy [name] MRN: [id number] DOB: [birth year]  Subjective: Randy [name] is a 88 y.o. year old male who is a primary care patient of [name], DO. The patient was referred to the Chronic Care Management team for assistance with care management needs subsequent to provider initiation of CCM services and plan of care.    Today's Visit:  Engaged with patient by telephone for follow up visit.        Goals Addressed             This Visit's Progress    CCM Expected Outcome:  Monitor, Self-Manage and Reduce Symptoms of  Lewy Body Dementia       Current Barriers:  Knowledge Deficits related to the progression of Lewy body dementia and impact it has on health and well being and impact on relationships of caregivers that work with the patient Care Coordination needs related to caregiver needs and special needs of the patient in a patient with advanced Lewy Body Dementia Chronic Disease Management support and education needs related to effective management of Lewy body dementia  Cognitive Deficits  Planned Interventions: Evaluation of current treatment plan related to management of Lewy body dementia and patient's adherence to plan as established by provider.The patients daughter states the patient continues to decline and they are just taking it day by day. Reflective listening and support. Advised patient to call the office for changes in mood, anxiety, depression, or decreased memory loss Provided education to patient re: changes that occur over the course of time with Lewy body dementia and to monitor for deficits and decline in the patients ability to perform ADLS Reviewed medications with patient and discussed compliance. Works with pharm D for effective management of medications. The family and the patients daughter manage his medications. He is taking as directed.   Provided patient with fall and safety  information educational materials related to preventing falls and safety precautions in patient with Lewy body dementia. Denies any new falls. States sometimes he may almost miss the chair when transitioning but has not had any falls.  Reviewed scheduled/upcoming provider appointments including No upcoming appointments with the pcp. Knows to call for changes or new needs.  Discussed plans with patient for ongoing care management follow up and provided patient with direct contact information for care management team Advised patient to discuss new onset of sx and sx of cold and changes in the patient mobility and memory with provider Screening for signs and symptoms of depression related to chronic disease state  Assessed social determinant of health barriers  Symptom Management: Take medications as prescribed   Attend all scheduled provider appointments Call provider office for new concerns or questions  call the Suicide and Crisis Lifeline: 988 call the [place] National Suicide Prevention Lifeline: (928)101-2306 or TTY: 828-802-3013 TTY (224)126-8629) to talk to a trained counselor call 1-800-273-TALK (toll free, 24 hour hotline) if experiencing a Mental Health or Behavioral Health Crisis   Follow Up Plan: Telephone follow up appointment with care management team member scheduled for: 07-16-2022 at 0945 am       CCM Expected Outcome:  Monitor, Self-Manage and Reduce Symptoms of  Osteoarthritis joint pain       Current Barriers:  Knowledge Deficits related to how to effectively manage pain and prevent falls with injuries in patient with OA Care Coordination needs related to evaluation of pain in a patient with OA with advanced  Lewy body dementia Chronic Disease Management support and education needs related to effective management of OA with patient with memory deficits  Cognitive Deficits  Planned Interventions: Reviewed provider established plan for pain management. The patient is stable with  pain. The patients daughter states that the patient still complains of pain in his legs. She has pain medications and that helps. It varies daily and sometimes it helps better than others. Education on trying heat and cold application and different things to help with the pain and discomfort he is having. This has been an ongoing concern and issue. Will continue to monitor for changes.  Discussed importance of adherence to all scheduled medical appointments. Pending referral to ortho for additional recommendations and treatment options. Has seen the ortho provider.  ; Counseled on the importance of reporting any/all new or changed pain symptoms or management strategies to pain management provider. Education and review given; Advised patient to report to care team affect of pain on daily activities; Discussed use of relaxation techniques and/or diversional activities to assist with pain reduction (distraction, imagery, relaxation, massage, acupressure, TENS, heat, and cold application. Did discuss trying heat application to see if this would help the patient. The daughter states they have not tried this.  Reviewed with patient prescribed pharmacological and nonpharmacological pain relief strategies. The patient has paid caregiver during the day and he tries to work with him on exercises that the PT did with the patient when they were working with him. The patient lots of times will not do this due to the pain level and increased pain level; Advised patient to discuss unresolved pain, changes in level or intensity of pain, and safety and falls prevention with provider; Review of measures to keep the patient safe and preventing falls. The patient is at high risk for falls due to impaired mobility and history or falls. Has worked with PT in the past. The daughter denies any new falls with the patient. He is staying in the wheelchair a lot. The patients daughter states he is not walking any and that he just pivots  from the chair to the potty and bed. Education and support given.  Symptom Management: Take medications as prescribed   Attend all scheduled provider appointments Call pharmacy for medication refills 3-7 days in advance of running out of medications call the Suicide and Crisis Lifeline: 988 call the [place] National Suicide Prevention Lifeline: 828 797 4542 or TTY: (670) 780-1945 TTY 772 658 0014) to talk to a trained counselor call 1-800-273-TALK (toll free, 24 hour hotline) if experiencing a Mental Health or Behavioral Health Crisis   Follow Up Plan: Telephone follow up appointment with care management team member scheduled for: 07-16-2022 at 0945 am       CCM Expected Outcome:  Monitor, Self-Manage, and Reduce Symptoms of Hypertension       Current Barriers:  Knowledge Deficits related to importance of normalized blood pressures in the patient with lewy body dementia and HTN Care Coordination needs related to taking blood pressures on a regular basis and calling the office for changes in blood pressures in a patient with HTN and Lewy body dementia Chronic Disease Management support and education needs related to effective management of HTN Cognitive Deficits  BP Readings from Last 3 Encounters:  03/04/22 122/86  02/08/22 (!) 155/79  01/23/22 (!) 151/91     Planned Interventions: Evaluation of current treatment plan related to hypertension self management and patient's adherence to plan as established by provider. Blood pressures are currently more stable. The patient  is requiring 24/7 care. The patients daughter states that they are just taking things day by day.  Provided education to patient re: stroke prevention, s/s of heart attack and stroke; Reviewed prescribed diet heart healthy. The patient is eating better and is on a more consistent schedule with his eating patterns. The patients grandson in law is with him during the day and helps with meeting his needs.  Reviewed medications  with patient and discussed importance of compliance. States compliance with medications;  Discussed plans with patient for ongoing care management follow up and provided patient with direct contact information for care management team; Advised patient, providing education and rationale, to monitor blood pressure daily and record, calling PCP for findings outside established parameters;  Reviewed scheduled/upcoming provider appointments including:  Sees pcp on a regular basis. Knows to call for changes.  Advised patient to discuss changes in blood pressures or heart health with provider; Provided education on prescribed diet heart healthy;  Discussed complications of poorly controlled blood pressure such as heart disease, stroke, circulatory complications, vision complications, kidney impairment, sexual dysfunction;  Screening for signs and symptoms of depression related to chronic disease state;  Assessed social determinant of health barriers;   Symptom Management: Take medications as prescribed   Attend all scheduled provider appointments Call provider office for new concerns or questions  call the Suicide and Crisis Lifeline: 988 call the [place] National Suicide Prevention Lifeline: 725 609 5014 or TTY: 323-826-0134 TTY (432) 541-7880) to talk to a trained counselor call 1-800-273-TALK (toll free, 24 hour hotline) if experiencing a Mental Health or Behavioral Health Crisis  check blood pressure 3 times per week write blood pressure results in a log or diary learn about high blood pressure keep a blood pressure log call doctor for signs and symptoms of high blood pressure develop an action plan for high blood pressure keep all doctor appointments take medications for blood pressure exactly as prescribed report new symptoms to your doctor eat more whole grains, fruits and vegetables, lean meats and healthy fats  Follow Up Plan: Telephone follow up appointment with care management team  member scheduled for: 07-16-2022  at 0945 am          Plan:Telephone follow up appointment with care management team member scheduled for:  07-16-2022 at 0945 am  Alto Denver RN, MSN, CCM RN Care Manager  Chronic Care Management Direct Number: [phone]

## 2022-06-04 ENCOUNTER — Other Ambulatory Visit: Payer: Self-pay

## 2022-06-04 DIAGNOSIS — R600 Localized edema: Secondary | ICD-10-CM

## 2022-06-04 MED ORDER — FUROSEMIDE 20 MG PO TABS: 20.0000 mg | ORAL_TABLET | ORAL | 0 refills | Status: DC

## 2022-06-07 DIAGNOSIS — G3183 Dementia with Lewy bodies: Secondary | ICD-10-CM | POA: Diagnosis not present

## 2022-06-07 DIAGNOSIS — M159 Polyosteoarthritis, unspecified: Secondary | ICD-10-CM | POA: Diagnosis not present

## 2022-06-07 DIAGNOSIS — F0280 Dementia in other diseases classified elsewhere without behavioral disturbance: Secondary | ICD-10-CM | POA: Diagnosis not present

## 2022-06-07 DIAGNOSIS — I10 Essential (primary) hypertension: Secondary | ICD-10-CM | POA: Diagnosis not present

## 2022-06-19 ENCOUNTER — Other Ambulatory Visit: Payer: Self-pay | Admitting: Family Medicine

## 2022-06-19 DIAGNOSIS — M150 Primary generalized (osteo)arthritis: Secondary | ICD-10-CM

## 2022-06-19 DIAGNOSIS — Z515 Encounter for palliative care: Secondary | ICD-10-CM

## 2022-06-19 DIAGNOSIS — M159 Polyosteoarthritis, unspecified: Secondary | ICD-10-CM

## 2022-06-19 DIAGNOSIS — G8929 Other chronic pain: Secondary | ICD-10-CM

## 2022-06-19 DIAGNOSIS — M545 Low back pain, unspecified: Secondary | ICD-10-CM

## 2022-06-19 NOTE — Telephone Encounter (Signed)
Medication Refill - Medication: HYDROcodone-acetaminophen (NORCO/VICODIN) 5-325 MG tablet   Has the patient contacted their pharmacy? Yes.   (Agent: If no, request that the patient contact the pharmacy for the refill. If patient does not wish to contact the pharmacy document the reason why and proceed with request.) (Agent: If yes, when and what did the pharmacy advise?)  Preferred Pharmacy (with phone number or street name):  [id number] - [place], [place] - [address]  [address] [place] [address]  Phone: [phone] Fax: [phone]   Has the patient been seen for an appointment in the last year OR does the patient have an upcoming appointment? Yes.    Agent: Please be advised that RX refills may take up to 3 business days. We ask that you follow-up with your pharmacy.  

## 2022-06-20 MED ORDER — HYDROCODONE-ACETAMINOPHEN 5-325 MG PO TABS: 1.0000 | ORAL_TABLET | Freq: Four times a day (QID) | ORAL | 0 refills | Status: DC | PRN

## 2022-06-20 NOTE — Telephone Encounter (Signed)
Requested medication (s) are due for refill today: na  Requested medication (s) are on the active medication list: yes  Last refill:  04/16/22 #120 0 refills  Future visit scheduled: no   Notes to clinic:  not delegated per protocol. Do you want to refill Rx?     Requested Prescriptions  Pending Prescriptions Disp Refills   HYDROcodone-acetaminophen (NORCO/VICODIN) 5-325 MG tablet 120 tablet 0    Sig: Take 1 tablet by mouth every 6 (six) hours as needed for moderate pain.     Not Delegated - Analgesics:  Opioid Agonist Combinations Failed - 06/19/2022  4:15 PM      Failed - This refill cannot be delegated      Failed - Urine Drug Screen completed in last 360 days      Failed - Valid encounter within last 3 months    Recent Outpatient Visits           3 months ago Lewy body dementia without behavioral disturbance [facility])   [place] [facility] [place], Netta Neat, DO   7 months ago Acute maxillary sinusitis, recurrence not specified   [place] [facility] Mecum, Butterfield E, [place]   8 months ago Chronic pain of left knee   [place] [facility] [name], DO   1 year ago Nausea and vomiting, unspecified vomiting type   [place] [facility] [place], Salvadore Oxford, NP   1 year ago Acute non-recurrent frontal sinusitis   [place] [facility] [place], Netta Neat, DO       Future Appointments             Tomorrow Charlsie Quest, NP [facility]   In 3 weeks Richardo Hanks, Laurette Schimke, MD [facility] [place]

## 2022-06-21 ENCOUNTER — Ambulatory Visit (INDEPENDENT_AMBULATORY_CARE_PROVIDER_SITE_OTHER): Payer: Medicare Other | Admitting: Pharmacist

## 2022-06-21 ENCOUNTER — Ambulatory Visit: Payer: Medicare Other | Attending: Cardiology | Admitting: Cardiology

## 2022-06-21 ENCOUNTER — Encounter: Payer: Self-pay | Admitting: Cardiology

## 2022-06-21 VITALS — BP 138/64 | HR 82 | Ht 72.0 in | Wt 208.0 lb

## 2022-06-21 DIAGNOSIS — F0280 Dementia in other diseases classified elsewhere without behavioral disturbance: Secondary | ICD-10-CM

## 2022-06-21 DIAGNOSIS — I872 Venous insufficiency (chronic) (peripheral): Secondary | ICD-10-CM

## 2022-06-21 DIAGNOSIS — I890 Lymphedema, not elsewhere classified: Secondary | ICD-10-CM

## 2022-06-21 DIAGNOSIS — I10 Essential (primary) hypertension: Secondary | ICD-10-CM | POA: Diagnosis not present

## 2022-06-21 DIAGNOSIS — E7800 Pure hypercholesterolemia, unspecified: Secondary | ICD-10-CM

## 2022-06-21 DIAGNOSIS — R413 Other amnesia: Secondary | ICD-10-CM

## 2022-06-21 DIAGNOSIS — G3183 Dementia with Lewy bodies: Secondary | ICD-10-CM | POA: Diagnosis not present

## 2022-06-21 NOTE — Chronic Care Management (AMB) (Signed)
Chronic Care Management CCM Pharmacy Note  06/21/2022 Name:  Randy [name] MRN:  [id number] DOB:  [birth year]   Subjective: Randy [name] is an 88 y.o. year old male who is a primary patient of [name], DO.  The CCM team was consulted for assistance with disease management and care coordination needs.    Engaged with patient's daughter/caregiver by telephone for follow up visit for pharmacy case management and/or care coordination services.   Objective:  Medications Reviewed Today     Reviewed by Manuela Neptune, RPH-CPP (Pharmacist) on 06/21/22 at [phone]  Med List Status: <None>   Medication Order Taking? Sig Documenting Provider Last Dose Status Informant  acetaminophen (TYLENOL) 500 MG tablet [id number]  Take 1,000 mg by mouth every 8 (eight) hours as needed. [provider]  Active Child, Pharmacy Records           Med Note Lawerance Sabal Jan 31, 2022  2:18 PM) Patient's daughter stated "scheduled"  carbidopa-levodopa (SINEMET CR) 50-200 MG tablet [id number]  Take 1 tablet by mouth 4 (four) times daily. [provider]  Active Child, Pharmacy Records           Med Note Lawerance Sabal Jan 31, 2022  2:20 PM) Patient takes with meals and bedtime (breakfast, lunch, dinner and bedtime)  cholecalciferol (VITAMIN D) 1000 units tablet [id number]  Take 1,000 Units by mouth daily. [provider]  Active Child, Pharmacy Records  Coenzyme Q10 (CO Q 10) 100 MG CAPS [id number]  Take 200 mg by mouth daily. [provider]  Active Child, Pharmacy Records  diclofenac Sodium (VOLTAREN) 1 % GEL [id number]  APPLY 2 G TOPICALLY 4 (FOUR) TIMES DAILY AS NEEDED. TO KNEE ARTHRITIS PAIN  Patient not taking: Reported on 02/26/2022   [name], DO  Active Child, Pharmacy Records  enalapril (VASOTEC) 5 MG tablet [id number] Yes TAKE 1 TABLET (5 MG TOTAL) BY MOUTH DAILY. [name], DO Taking Active Child, Pharmacy  Records  furosemide (LASIX) 20 MG tablet [id number] Yes Take 1 tablet (20 mg total) by mouth every other day. Patient takes Mondays, Wednesdays, and Fridays. Debbe Odea, MD Taking Active   HYDROcodone-acetaminophen (NORCO/VICODIN) 5-325 MG tablet [id number]  Take 1 tablet by mouth every 6 (six) hours as needed for moderate pain. [name], DO  Active   melatonin 5 MG TABS [id number]  Take 1 tablet (5 mg total) by mouth at bedtime. Arnetha Courser, MD  Active Child, Pharmacy Records  Multiple Vitamin (MULTIVITAMIN WITH MINERALS) TABS tablet [id number]  Take 1 tablet by mouth daily. Alford Highland, MD  Active Child, Pharmacy Records  Omega-3 Fatty Acids (FISH OIL) 1200 MG CAPS [id number]  Take 1,200 mg by mouth in the morning, at noon, in the evening, and at bedtime. [provider]  Active Child, Pharmacy Records           Med Note Lawerance Sabal Jan 31, 2022  2:21 PM) Patient takes at least 3 times daily (having some difficulties with swallowing at this time)  omeprazole (PRILOSEC) 20 MG capsule [id number]  Take 20 mg by mouth 2 (two) times daily. [provider]  Active Child, Pharmacy Records  ondansetron (ZOFRAN-ODT) 4 MG disintegrating tablet [id number]  Take 1 tablet (4 mg total) by mouth every 8 (eight) hours as needed for nausea or vomiting. [name], DO  Active Child, Pharmacy Records  rivastigmine (EXELON) 3 MG capsule  [id number]  Take 3 mg by mouth 2 (two) times daily. [provider]  Active Child, Pharmacy Records           Med Note Ronney Asters, [facility] A   Fri Jun 21, 2022  8:53 AM)    sertraline (ZOLOFT) 50 MG tablet [id number]  Take 50 mg by mouth daily. [provider]  Active Child, Pharmacy Records           Med Note Ronney Asters, [facility] A   Fri Jun 21, 2022  8:53 AM)    vitamin C (ASCORBIC ACID) 500 MG tablet [id number]  Take 500 mg by mouth daily. [provider]  Active Child, Pharmacy Records             Pertinent Labs:    Lab Results  Component Value Date   CREATININE 0.79 02/07/2022   BUN 25 (H) 02/07/2022   NA 136 02/07/2022   K 4.3 02/07/2022   CL 97 (L) 02/07/2022   CO2 30 02/07/2022   BP Readings from Last 3 Encounters:  03/04/22 122/86  02/08/22 (!) 155/79  01/23/22 (!) 151/91   Pulse Readings from Last 3 Encounters:  03/04/22 77  02/08/22 70  01/23/22 78     SDOH:  (Social Determinants of Health) assessments and interventions performed:  SDOH Interventions    Flowsheet Row Clinical Support from 10/05/2021 in [facility] Care Coordination from 08/13/2021 in [facility] Care Coordination Chronic Care Management from 10/09/2020 in [facility]  SDOH Interventions     Food Insecurity Interventions Intervention Not Indicated -- Intervention Not Indicated  Housing Interventions Intervention Not Indicated -- --  Transportation Interventions Intervention Not Indicated, Patient Resources (Friends/Family) Intervention Not Indicated, Other (Comment)  [will need updated handicap placecard, expires 12-2021] Intervention Not Indicated  Utilities Interventions Intervention Not Indicated -- --  Alcohol Usage Interventions Intervention Not Indicated (Score <7) -- --  Financial Strain Interventions Intervention Not Indicated -- Intervention Not Indicated  Physical Activity Interventions Patient Refused Other (Comments)  [patient currently in rehab facility after a fall] --  Stress Interventions Intervention Not Indicated -- --  Social Connections Interventions Intervention Not Indicated -- Intervention Not Indicated       CCM Care Plan  Review of patient past medical history, allergies, medications, health status, including review of consultants reports, laboratory and other test data, was performed as part of comprehensive evaluation and provision of chronic care management services.   Care  Plan : PharmD - Medication Managment  Updates made by Manuela Neptune, RPH-CPP since 06/21/2022 12:00 AM     Problem: Disease Progression      Long-Range Goal: Disease Progression Prevented or Minimized   Start Date: 01/19/2020  Expected End Date: 04/18/2020  Recent Progress: On track  Priority: High  Note:   Current Barriers:  Chronic Disease Management support, education, and care coordination needs related to hypertension, osteoarthritis, hyperlipidemia, insomnia and memory loss Cognitive Deficits  Pharmacist Clinical Goal(s):  Over the next 90 days, caregiver/patient will adhere to plan to optimize therapeutic regimen for hypertension as evidenced by report of adherence to recommended medication management changes through collaboration with PharmD and provider.   Interventions: 1:1 collaboration with [name], DO regarding development and update of comprehensive plan of care as evidenced by provider attestation and co-signature Inter-disciplinary care team collaboration (see longitudinal plan of care) States son-in-law, Thayer Ohm, continuing to provide care to patient throughout the day, including preparing meals for  patient and aiding with medication administration Reports patient completed weekly physical therapy, but patient continues to do his exercises provided by PT with caregiver at home Reports patient taking hydrocodone as needed as recommended by PCP. Have counseled to have patient sit or lay down after taking  Hypertension Reports patient taking: Enalapril 5 mg daily in the morning Furosemide 20 mg on Mondays, Wednesdays and Fridays Reports denies monitoring home BP recently Patient uses of zippered compression socks and to elevate legs throughout the day Reports lymphedema pump from vascular specialist has helped with leg swelling Encourage caregiver to keep record when monitors home BP, keep log, bring this record to medical appointments and contact  office for readings outside of established parameters or symptoms Note patient has follow up appointment with [facility] today    Medication Adherence Note patient using weekly pillbox with slots for four times daily dosing Reports son-in-law assisting with remembering four times/day dosing Have discussed with caregiver additional strategies for using medication alarm as adherence aid   Patient Goals/Self-Care Activities Over the next 90 days, caregiver/patient will:  - take medications as prescribed - check blood pressure, document, and provide at future appointments        Plan:   Patient denies further medication questions or concerns today Provide patient with contact information for clinic pharmacist to contact if needed in future for medication questions/concerns   Estelle Grumbles, PharmD, Patsy Baltimore, CPP Clinical Pharmacist [facility] [phone]

## 2022-06-21 NOTE — Patient Instructions (Signed)
Visit Information  Thank you for taking time to visit with me today. Please don't hesitate to contact me if I can be of assistance to you before our next scheduled telephone appointment.  Following are the goals we discussed today:   Goals Addressed             This Visit's Progress    Pharmacy Goals       Feel free to call me with any medication questions or concerns.    Estelle Grumbles, PharmD, BCACP Clinical Pharmacist [facility] [phone]        Please call the care guide team at [phone] if you need to cancel or reschedule your appointment.    Patient verbalizes understanding of instructions and care plan provided today and agrees to view in MyChart. Active MyChart status and patient understanding of how to access instructions and care plan via MyChart confirmed with patient.

## 2022-06-21 NOTE — Patient Instructions (Signed)
Medication Instructions:  Your physician recommends that you continue on your current medications as directed. Please refer to the Current Medication list given to you today.  *If you need a refill on your cardiac medications before your next appointment, please call your pharmacy*  Lab Work: -None ordered  Testing/Procedures: -None ordered  Follow-Up: At [facility], you and your health needs are our priority.  As part of our continuing mission to provide you with exceptional heart care, we have created designated Provider Care Teams.  These Care Teams include your primary Cardiologist (physician) and Advanced Practice Providers (APPs -  Physician Assistants and Nurse Practitioners) who all work together to provide you with the care you need, when you need it.  Your next appointment:   1 year(s)  Provider:   You may see Debbe Odea, MD or one of the following Advanced Practice Providers on your designated Care Team:   Nicolasa Ducking, NP Eula Listen, PA-C Cadence Fransico Michael, PA-C Charlsie Quest, NP    Other Instructions -None

## 2022-06-21 NOTE — Progress Notes (Signed)
Cardiology Office Note:  .   Date:  06/21/2022  ID:  Lillia Carmel, DOB Sep 11, 1934, MRN [id number] PCP: [name], DO  [facility] Providers Cardiologist:  Debbe Odea, MD    History of Present Illness: .   Randy [name] is a 88 y.o. male with history of hypertension, hyperlipidemia, Lewy body dementia, who presents for follow-up today.  Prior echocardiogram 06/2020 revealed normal systolic function with EF of 55 to 60%, impaired relaxation.  Lexiscan Myoview with no evidence for ischemia.  Coronary artery calcification was noted.    He was last seen in clinic 02/26/2021 by Dr.Agbor-Etang.  At that time he was being seen for hypertension but his blood pressure been adequately controlled.  He had felt well and had no concerns or complaints at that time was taking his medications as prescribed.  There were no changes made to his current medication regimen and no further testing that was ordered.  He was hospitalized see on 01/31/2022 was found to be septic secondary to influenza A infection, community-acquired pneumonia complicated by acute hypoxic respiratory failure.  BNP was only 416.  He was treated with IV Lasix for suspected acute exacerbation of chronic diastolic congestive heart failure.  CT of the chest did not show any evidence of acute pulmonary embolism.  Echocardiogram in June 2022 revealed EF of 55 to 60% grade 1 diastolic dysfunction.  He was evaluated by PT and OT who recommended further rehabilitation at skilled nursing facility.  Unfortunately he cannot be weaned off of oxygen so he had to be discharged on 3 L of O2 nasal cannula.  He was deemed medically stable for discharge to SNF on 02/08/2022.  He returns to clinic today accompanied by family member stating that overall he has been doing well.  He has returned home from previous SNF placement for rehab after hospitalization.  Blood pressure has been well-controlled on his current medication regimen.  Denies  any chest pain, shortness of breath, peripheral edema.  Does occasionally have some peripheral edema but he sits in a wheelchair with his legs dependent with majority of the day.  Denies any hospitalizations or visits to the emergency department  ROS: 10 point review of systems has been completed and considered negative with exception of what is been listed in the HPI  Studies Reviewed: [name]    EKG: Sinus rhythm with a rate of 82, left axis deviation, LVH, No acute change from prior studies  TTE 06/20/20 1. Left ventricular ejection fraction, by estimation, is 55 to 60%. The  left ventricle has normal function. The left ventricle has no regional  wall motion abnormalities. Left ventricular diastolic parameters are  consistent with Grade I diastolic  dysfunction (impaired relaxation).   2. Right ventricular systolic function is normal. The right ventricular  size is not well visualized.   3. Right atrial size was mildly dilated.   4. The mitral valve is normal in structure. No evidence of mitral valve  regurgitation.   5. The aortic valve is tricuspid. Aortic valve regurgitation is mild.   6. Aortic dilatation noted. There is mild dilatation of the aortic root,  measuring 41 mm.  Risk Assessment/Calculations:             Physical Exam:   VS:  BP 138/64 (BP Location: Right Arm, Patient Position: Sitting, Cuff Size: Normal)   Pulse 82   Ht 6' (1.829 m)   Wt 208 lb (94.3 kg)   SpO2 95%   BMI  28.21 kg/m    Wt Readings from Last 3 Encounters:  06/21/22 208 lb (94.3 kg)  03/04/22 210 lb (95.3 kg)  02/01/22 210 lb 8.6 oz (95.5 kg)    GEN: Well nourished, well developed in no acute distress NECK: No JVD; No carotid bruits CARDIAC: RRR, no murmurs, rubs, gallops RESPIRATORY:  Clear to auscultation without rales, wheezing or rhonchi  ABDOMEN: Soft, non-tender, non-distended EXTREMITIES:  1+ nonpitting edema; No deformity   ASSESSMENT AND PLAN: .   Primary hypertension with blood  pressure today of 138/64.  He is continued on enalapril 5 mg daily and furosemide 20 mg 3 days a week.  Remains stable without changes made to his medications today.  Hyperlipidemia with last LDL of 102.  He is to continue with his low-cholesterol diet.  No statin medications added to his current medication regimen.  Lymphedema to the bilateral lower extremities and his chronic condition.  He has Lasix that he takes 3 days a week.  He has been encouraged to continue to use his lymphedema pumps.  Lewy body dementia/Parkinson's.  He is continued on carbidopa levodopa.  Continues to be managed by his PCP.        Dispo: Patient return to clinic to see MD/APP in 1 year or sooner if needed.  EKG on return.  No refills needed today.  No further testing needed.  Signed, Vincent Ehrler, NP

## 2022-07-05 ENCOUNTER — Other Ambulatory Visit: Payer: Medicare Other

## 2022-07-07 DIAGNOSIS — I10 Essential (primary) hypertension: Secondary | ICD-10-CM

## 2022-07-12 ENCOUNTER — Other Ambulatory Visit: Payer: Self-pay | Admitting: *Deleted

## 2022-07-12 ENCOUNTER — Other Ambulatory Visit: Payer: Medicare Other

## 2022-07-12 DIAGNOSIS — C61 Malignant neoplasm of prostate: Secondary | ICD-10-CM

## 2022-07-13 LAB — PSA: Prostate Specific Ag, Serum: 0.2 ng/mL (ref 0.0–4.0)

## 2022-07-16 ENCOUNTER — Telehealth: Payer: Self-pay

## 2022-07-16 ENCOUNTER — Telehealth: Payer: Medicare Other

## 2022-07-16 NOTE — Telephone Encounter (Signed)
   CCM RN Visit Note   07-16-2022 Name: Randy [name] MRN: [id number]      DOB: 01-31-1934  Subjective: Randy [name] is a 88 y.o. year old male who is a primary care patient of Dr. Saralyn Pilar. The patient was referred to the Chronic Care Management team for assistance with care management needs subsequent to provider initiation of CCM services and plan of care.      An unsuccessful telephone outreach was attempted today to contact the patient about Chronic Care Management needs.    Plan:A HIPAA compliant phone message was left for the patient providing contact information and requesting a return call.  Alto Denver RN, MSN, CCM RN Care Manager  Chronic Care Management Direct Number: [phone]

## 2022-07-17 ENCOUNTER — Ambulatory Visit: Payer: Medicare Other | Admitting: Urology

## 2022-07-17 ENCOUNTER — Other Ambulatory Visit: Payer: Self-pay

## 2022-07-17 VITALS — BP 179/89

## 2022-07-17 DIAGNOSIS — N401 Enlarged prostate with lower urinary tract symptoms: Secondary | ICD-10-CM | POA: Diagnosis not present

## 2022-07-17 DIAGNOSIS — C61 Malignant neoplasm of prostate: Secondary | ICD-10-CM

## 2022-07-17 DIAGNOSIS — N138 Other obstructive and reflux uropathy: Secondary | ICD-10-CM

## 2022-07-17 DIAGNOSIS — N390 Urinary tract infection, site not specified: Secondary | ICD-10-CM

## 2022-07-17 DIAGNOSIS — Z87440 Personal history of urinary (tract) infections: Secondary | ICD-10-CM | POA: Diagnosis not present

## 2022-07-17 LAB — BLADDER SCAN AMB NON-IMAGING

## 2022-07-17 NOTE — Progress Notes (Signed)
   07/17/2022 11:06 AM   Len Blalock Jillyn Hidden 1934-06-05 [id number]  Reason for visit: Follow up history of urinary retention, recurrent UTIs, dysuria, prostate cancer  HPI: 88 year old male with Lewy body dementia who presented to me in fall 2020 with Foley dependent urinary retention, and ultimately underwent a HOLEP with removal of 33 g of tissue showing <5% involvement with prostate adenocarcinoma Gleason score 4+3=7.  PSA preop was 2.7, and postop was 0.5, then 0.6 in June 2022.  He had significant bleeding at the time of surgery, and only the right lateral lobe and median lobes were removed.  He had been doing well with low PVRs and had resumed spontaneous voiding, however developed an Enterococcus UTI in April 2022 followed by an additional UTI a few months later.  He also was having some urinary symptoms of dysuria despite appropriate antibiotics.  I recommended a cystoscopy, and in October 2022 he was found to have a 3 cm bladder stone and some residual obstructive appearing left lateral lobe tissue.  He underwent uncomplicated cystolitholapaxy and HOLEP of the remaining left lateral lobe on 10/20/2020 with removal of 23 g of benign tissue.  He is doing very well since surgery and denies any incontinence, dysuria, UTIs, or other urinary symptoms, and is voiding with a strong stream.  PVR is normal at 4 mL.  Most recent PSA remains very low at 0.2 from 07/12/2022.  We discussed the need to continue to follow the PSA with his history of intermediate risk prostate cancer at time of original HOLEP in 2020, however would be very unlikely that he would require any intervention for this in the future based on his age and comorbidities.  RTC 1 year PSA and PVR.  If doing well at that time could consider follow-up as needed, with PCP checking a yearly PSA  Sondra Come, MD  [facility] [address] [place], [place] [address] [phone]

## 2022-08-06 ENCOUNTER — Other Ambulatory Visit: Payer: Self-pay | Admitting: Family Medicine

## 2022-08-06 DIAGNOSIS — M545 Low back pain, unspecified: Secondary | ICD-10-CM

## 2022-08-06 DIAGNOSIS — G8929 Other chronic pain: Secondary | ICD-10-CM

## 2022-08-06 DIAGNOSIS — M159 Polyosteoarthritis, unspecified: Secondary | ICD-10-CM

## 2022-08-06 DIAGNOSIS — Z515 Encounter for palliative care: Secondary | ICD-10-CM

## 2022-08-06 DIAGNOSIS — M150 Primary generalized (osteo)arthritis: Secondary | ICD-10-CM

## 2022-08-06 NOTE — Telephone Encounter (Signed)
Medication Refill - Medication: HYDROcodone-acetaminophen (NORCO/VICODIN) 5-325 MG tablet   Has the patient contacted their pharmacy? No.  Preferred Pharmacy (with phone number or street name):  [id number] [address] [address] Phone: [phone] Fax: [phone]    Has the patient been seen for an appointment in the last year OR does the patient have an upcoming appointment? Yes.    Agent: Please be advised that RX refills may take up to 3 business days. We ask that you follow-up with your pharmacy.

## 2022-08-07 ENCOUNTER — Telehealth: Payer: Self-pay | Admitting: Family Medicine

## 2022-08-07 MED ORDER — HYDROCODONE-ACETAMINOPHEN 5-325 MG PO TABS: 1.0000 | ORAL_TABLET | Freq: Four times a day (QID) | ORAL | 0 refills | Status: DC | PRN

## 2022-08-07 NOTE — Telephone Encounter (Signed)
Pt's daughter called in states, pt is out of the hydrocodone. Request received yesterday, I let her know we are workng on this

## 2022-08-07 NOTE — Telephone Encounter (Signed)
Requested medication (s) are due for refill today: yes  Requested medication (s) are on the active medication list: yes  Last refill:  06/20/22  Future visit scheduled: yes  Notes to clinic:  Unable to refill per protocol, cannot delegate.      Requested Prescriptions  Pending Prescriptions Disp Refills   HYDROcodone-acetaminophen (NORCO/VICODIN) 5-325 MG tablet 120 tablet 0    Sig: Take 1 tablet by mouth every 6 (six) hours as needed for moderate pain.     Not Delegated - Analgesics:  Opioid Agonist Combinations Failed - 08/06/2022  2:37 PM      Failed - This refill cannot be delegated      Failed - Urine Drug Screen completed in last 360 days      Failed - Valid encounter within last 3 months    Recent Outpatient Visits           5 months ago Lewy body dementia without behavioral disturbance [facility])   [facility] [place], Netta Neat, DO   9 months ago Acute maxillary sinusitis, recurrence not specified   [place] [facility] M[place], [place], [place]   9 months ago Chronic pain of left knee   [place] [facility] [name], DO   1 year ago Nausea and vomiting, unspecified vomiting type   [place] [facility] [place], Salvadore [place], NP   1 year ago Acute non-recurrent frontal sinusitis   [place] [facility] [name], DO       Future Appointments             In 11 months Richardo Hanks, Laurette Schimke, MD [facility] [place]

## 2022-08-08 ENCOUNTER — Other Ambulatory Visit: Payer: Medicare Other

## 2022-08-08 ENCOUNTER — Telehealth: Payer: Medicare Other

## 2022-08-08 ENCOUNTER — Other Ambulatory Visit: Payer: Self-pay

## 2022-08-08 NOTE — Patient Outreach (Addendum)
Care Management   Visit Note  08/08/2022 Name: Randy [name] MRN: [id number] DOB: 06-Nov-1934  Subjective: Randy [name] is a 88 y.o. year old male who is a primary care patient of [name], DO. The Care Management team was consulted for assistance.      Engaged with patient  Spoke with the patient daughter, Deedra Ehrich who is also DPR .   Assessment:   Goals Addressed     Goals Addressed             This Visit's Progress    RNCM Care Management Expected Outcome:  Monitor, Self-Manage and Reduce Symptoms of  Lewy Body Dementia       Current Barriers:  Knowledge Deficits related to the progression of Lewy body dementia and impact it has on health and well being and impact on relationships of caregivers that work with the patient Care Coordination needs related to caregiver needs and special needs of the patient in a patient with advanced Lewy Body Dementia Chronic Disease Management support and education needs related to effective management of Lewy body dementia  Cognitive Deficits  Planned Interventions: Evaluation of current treatment plan related to management of Lewy body dementia and patient's adherence to plan as established by provider.The patients daughter states the patient continues to decline and they are just taking it day by day. She says things the last several days have been "horrible". The patient is having more hallucinations and he is not eating. He has been staying in the bed. The patients daughter states that she is tired and does not know what to do. She cannot move him but her son in law is there. The patient is out of pain medications. She had meant to call the office on Monday but she did yesterday. In basket message sent to the pcp asking for assistance with getting refill on the pain medications. Will continue to monitor for changes.  Reflective listening and support. Advised patient to call the office for changes in mood, anxiety, depression, or  decreased memory loss Provided education to patient re: changes that occur over the course of time with Lewy body dementia and to monitor for deficits and decline in the patients ability to perform ADLS. The patient is steadily declining. The patient daughter states he is more agitated and "fights back" when he is being touched. Have let the pcp know. Will continue to monitor.  Reviewed medications with patient and discussed compliance. Works with pharm D for effective management of medications. The family and the patients daughter manage his medications. He is taking as directed.   Provided patient with fall and safety information educational materials related to preventing falls and safety precautions in patient with Lewy body dementia. Denies any new falls. States sometimes he may almost miss the chair when transitioning but has not had any falls.  Reviewed scheduled/upcoming provider appointments including No upcoming appointments with the pcp. Knows to call for changes or new needs.  Discussed plans with patient for ongoing care management follow up and provided patient with direct contact information for care management team Advised patient to discuss new onset of sx and sx of cold and changes in the patient mobility and memory with provider Screening for signs and symptoms of depression related to chronic disease state  Assessed social determinant of health barriers  Symptom Management: Take medications as prescribed   Attend all scheduled provider appointments Call provider office for new concerns or questions  call the Suicide and Crisis  Lifeline: 988 call the [place] National Suicide Prevention Lifeline: 315 056 6835 or TTY: 8060559057 TTY (775) 146-9348) to talk to a trained counselor call 1-800-273-TALK (toll free, 24 hour hotline) if experiencing a Mental Health or Behavioral Health Crisis   Follow Up Plan: Telephone follow up appointment with care management team member scheduled for:  08-30-2022 at 0900 am       RNCM Care Management Expected Outcome:  Monitor, Self-Manage and Reduce Symptoms of  Osteoarthritis joint pain       Current Barriers:  Knowledge Deficits related to how to effectively manage pain and prevent falls with injuries in patient with OA Care Coordination needs related to evaluation of pain in a patient with OA with advanced Lewy body dementia Chronic Disease Management support and education needs related to effective management of OA with patient with memory deficits  Cognitive Deficits  Planned Interventions: Reviewed provider established plan for pain management. The patients daughter states that his pain is out of control. She is also out of pain medications. Refill request sent to the pcp for review. She also says the patient is not sleeping. The patient is having more hallucinations. Recommendations given. Will continue to monitor and follow up accordingly with the patients daughter.  Discussed importance of adherence to all scheduled medical appointments. The patient has had drastic changes over the last several days. Education provided. Have consulted with the pcp for recommendations. Counseled on the importance of reporting any/all new or changed pain symptoms or management strategies to pain management provider. Education and review given; Advised patient to report to care team affect of pain on daily activities; Discussed use of relaxation techniques and/or diversional activities to assist with pain reduction (distraction, imagery, relaxation, massage, acupressure, TENS, heat, and cold application. Did discuss trying heat application to see if this would help the patient. The daughter states they have not tried this.  Reviewed with patient prescribed pharmacological and nonpharmacological pain relief strategies. The patient has paid caregiver during the day and he tries to work with him on exercises that the PT did with the patient when they were  working with him. The patient lots of times will not do this due to the pain level and increased pain level; Advised patient to discuss unresolved pain, changes in level or intensity of pain, and safety and falls prevention with provider; Review of measures to keep the patient safe and preventing falls. The patient is at high risk for falls due to impaired mobility and history or falls. Has worked with PT in the past. The daughter denies any new falls with the patient. The patient is bed bound and has not been getting up. Education provided.  Call made back to the patients daughter today and let her know that the pcp has refilled the patients pain medications and they should be available at the pharmacy. Also discussed palliative services and Hospice. Silvio Pate says they are not ready for that yet and it has been awhile since Florentina Addison has been out with palliative. Recommended her call Florentina Addison and see about getting her to come out to the home for evaluation and support. Will continue to monitor for changes or new needs related to palliative services vs Hospice. The daughter knows how to reach the [facility] if needed.   Symptom Management: Take medications as prescribed   Attend all scheduled provider appointments Call pharmacy for medication refills 3-7 days in advance of running out of medications call the Suicide and Crisis Lifeline: 988 call the [place] National Suicide Prevention Lifeline:  978-490-1792 or TTY: 8202391185 TTY 256-083-8762) to talk to a trained counselor call 1-800-273-TALK (toll free, 24 hour hotline) if experiencing a Mental Health or Behavioral Health Crisis   Follow Up Plan: Telephone follow up appointment with care management team member scheduled for: 08-30-2022 at 0900 am       RNCM Care Management: Expected Outcome:  Monitor, Self-Manage, and Reduce Symptoms of Hypertension       Current Barriers:  Knowledge Deficits related to importance of normalized blood pressures in the patient  with lewy body dementia and HTN Care Coordination needs related to taking blood pressures on a regular basis and calling the office for changes in blood pressures in a patient with HTN and Lewy body dementia Chronic Disease Management support and education needs related to effective management of HTN Cognitive Deficits  BP Readings from Last 3 Encounters:  07/17/22 (!) 179/89  06/21/22 138/64  03/04/22 122/86     Planned Interventions: Evaluation of current treatment plan related to hypertension self management and patient's adherence to plan as established by provider. Blood pressures are elevated more and likely due to chronic pain and discomfort and agitation. The patient is requiring 24/7 care. The patients daughter states that they are just taking things day by day. She said he has had a significant decline over the last several days.  Provided education to patient re: stroke prevention, s/s of heart attack and stroke; Reviewed prescribed diet heart healthy. The patient has not been eating. The patients grandson in law is with him during the day and helps with meeting his needs.  Reviewed medications with patient and discussed importance of compliance. States compliance with medications;  Discussed plans with patient for ongoing care management follow up and provided patient with direct contact information for care management team; Advised patient, providing education and rationale, to monitor blood pressure daily and record, calling PCP for findings outside established parameters;  Reviewed scheduled/upcoming provider appointments including:  Sees pcp on a regular basis. Knows to call for changes.  Advised patient to discuss changes in blood pressures or heart health with provider; Provided education on prescribed diet heart healthy;  Discussed complications of poorly controlled blood pressure such as heart disease, stroke, circulatory complications, vision complications, kidney  impairment, sexual dysfunction;  Screening for signs and symptoms of depression related to chronic disease state;  Assessed social determinant of health barriers;   Symptom Management: Take medications as prescribed   Attend all scheduled provider appointments Call provider office for new concerns or questions  call the Suicide and Crisis Lifeline: 988 call the [place] National Suicide Prevention Lifeline: 864-154-3471 or TTY: 838-061-3146 TTY 256-640-8251) to talk to a trained counselor call 1-800-273-TALK (toll free, 24 hour hotline) if experiencing a Mental Health or Behavioral Health Crisis  check blood pressure 3 times per week write blood pressure results in a log or diary learn about high blood pressure keep a blood pressure log call doctor for signs and symptoms of high blood pressure develop an action plan for high blood pressure keep all doctor appointments take medications for blood pressure exactly as prescribed report new symptoms to your doctor eat more whole grains, fruits and vegetables, lean meats and healthy fats  Follow Up Plan: Telephone follow up appointment with care management team member scheduled for: 08-30-2022  at 0900 am                  Consent to Services:  Patient was given information about care management services, agreed to  services, and gave verbal consent to participate.   Plan: Telephone follow up appointment with care management team member scheduled for: 08-30-2022 at 0900 am  Alto Denver RN, MSN, CCM RN Care Manager  Chronic Care Management Direct Number: [phone]

## 2022-08-08 NOTE — Telephone Encounter (Signed)
Already refilled yesterday 08/07/22  They can pick up from pharmacy.  Saralyn Pilar, DO [facility] 08/08/2022, 1:27 PM

## 2022-08-08 NOTE — Patient Instructions (Signed)
Visit Information  Thank you for taking time to visit with me today. Please don't hesitate to contact me if I can be of assistance to you before our next scheduled telephone appointment.  Following are the goals we discussed today:   Goals Addressed             This Visit's Progress    RNCM Care Management Expected Outcome:  Monitor, Self-Manage and Reduce Symptoms of  Lewy Body Dementia       Current Barriers:  Knowledge Deficits related to the progression of Lewy body dementia and impact it has on health and well being and impact on relationships of caregivers that work with the patient Care Coordination needs related to caregiver needs and special needs of the patient in a patient with advanced Lewy Body Dementia Chronic Disease Management support and education needs related to effective management of Lewy body dementia  Cognitive Deficits  Planned Interventions: Evaluation of current treatment plan related to management of Lewy body dementia and patient's adherence to plan as established by provider.The patients daughter states the patient continues to decline and they are just taking it day by day. She says things the last several days have been "horrible". The patient is having more hallucinations and he is not eating. He has been staying in the bed. The patients daughter states that she is tired and does not know what to do. She cannot move him but her son in law is there. The patient is out of pain medications. She had meant to call the office on Monday but she did yesterday. In basket message sent to the pcp asking for assistance with getting refill on the pain medications. Will continue to monitor for changes.  Reflective listening and support. Advised patient to call the office for changes in mood, anxiety, depression, or decreased memory loss Provided education to patient re: changes that occur over the course of time with Lewy body dementia and to monitor for deficits and decline  in the patients ability to perform ADLS. The patient is steadily declining. The patient daughter states he is more agitated and "fights back" when he is being touched. Have let the pcp know. Will continue to monitor.  Reviewed medications with patient and discussed compliance. Works with pharm D for effective management of medications. The family and the patients daughter manage his medications. He is taking as directed.   Provided patient with fall and safety information educational materials related to preventing falls and safety precautions in patient with Lewy body dementia. Denies any new falls. States sometimes he may almost miss the chair when transitioning but has not had any falls.  Reviewed scheduled/upcoming provider appointments including No upcoming appointments with the pcp. Knows to call for changes or new needs.  Discussed plans with patient for ongoing care management follow up and provided patient with direct contact information for care management team Advised patient to discuss new onset of sx and sx of cold and changes in the patient mobility and memory with provider Screening for signs and symptoms of depression related to chronic disease state  Assessed social determinant of health barriers  Symptom Management: Take medications as prescribed   Attend all scheduled provider appointments Call provider office for new concerns or questions  call the Suicide and Crisis Lifeline: 988 call the [place] National Suicide Prevention Lifeline: 505-126-6955 or TTY: (226)266-4961 TTY (904)305-6631) to talk to a trained counselor call 1-800-273-TALK (toll free, 24 hour hotline) if experiencing a Mental Health or Behavioral Health  Crisis   Follow Up Plan: Telephone follow up appointment with care management team member scheduled for: 08-30-2022 at 0900 am       RNCM Care Management Expected Outcome:  Monitor, Self-Manage and Reduce Symptoms of  Osteoarthritis joint pain       Current  Barriers:  Knowledge Deficits related to how to effectively manage pain and prevent falls with injuries in patient with OA Care Coordination needs related to evaluation of pain in a patient with OA with advanced Lewy body dementia Chronic Disease Management support and education needs related to effective management of OA with patient with memory deficits  Cognitive Deficits  Planned Interventions: Reviewed provider established plan for pain management. The patients daughter states that his pain is out of control. She is also out of pain medications. Refill request sent to the pcp for review. She also says the patient is not sleeping. The patient is having more hallucinations. Recommendations given. Will continue to monitor and follow up accordingly with the patients daughter.  Discussed importance of adherence to all scheduled medical appointments. The patient has had drastic changes over the last several days. Education provided. Have consulted with the pcp for recommendations. Counseled on the importance of reporting any/all new or changed pain symptoms or management strategies to pain management provider. Education and review given; Advised patient to report to care team affect of pain on daily activities; Discussed use of relaxation techniques and/or diversional activities to assist with pain reduction (distraction, imagery, relaxation, massage, acupressure, TENS, heat, and cold application. Did discuss trying heat application to see if this would help the patient. The daughter states they have not tried this.  Reviewed with patient prescribed pharmacological and nonpharmacological pain relief strategies. The patient has paid caregiver during the day and he tries to work with him on exercises that the PT did with the patient when they were working with him. The patient lots of times will not do this due to the pain level and increased pain level; Advised patient to discuss unresolved pain, changes  in level or intensity of pain, and safety and falls prevention with provider; Review of measures to keep the patient safe and preventing falls. The patient is at high risk for falls due to impaired mobility and history or falls. Has worked with PT in the past. The daughter denies any new falls with the patient. The patient is bed bound and has not been getting up. Education provided.   Symptom Management: Take medications as prescribed   Attend all scheduled provider appointments Call pharmacy for medication refills 3-7 days in advance of running out of medications call the Suicide and Crisis Lifeline: 988 call the [place] National Suicide Prevention Lifeline: 757 679 6732 or TTY: 412-650-7690 TTY 306-824-7363) to talk to a trained counselor call 1-800-273-TALK (toll free, 24 hour hotline) if experiencing a Mental Health or Behavioral Health Crisis   Follow Up Plan: Telephone follow up appointment with care management team member scheduled for: 08-30-2022 at 0900 am       RNCM Care Management: Expected Outcome:  Monitor, Self-Manage, and Reduce Symptoms of Hypertension       Current Barriers:  Knowledge Deficits related to importance of normalized blood pressures in the patient with lewy body dementia and HTN Care Coordination needs related to taking blood pressures on a regular basis and calling the office for changes in blood pressures in a patient with HTN and Lewy body dementia Chronic Disease Management support and education needs related to effective management of HTN  Cognitive Deficits  BP Readings from Last 3 Encounters:  07/17/22 (!) 179/89  06/21/22 138/64  03/04/22 122/86     Planned Interventions: Evaluation of current treatment plan related to hypertension self management and patient's adherence to plan as established by provider. Blood pressures are elevated more and likely due to chronic pain and discomfort and agitation. The patient is requiring 24/7 care. The patients  daughter states that they are just taking things day by day. She said he has had a significant decline over the last several days.  Provided education to patient re: stroke prevention, s/s of heart attack and stroke; Reviewed prescribed diet heart healthy. The patient has not been eating. The patients grandson in law is with him during the day and helps with meeting his needs.  Reviewed medications with patient and discussed importance of compliance. States compliance with medications;  Discussed plans with patient for ongoing care management follow up and provided patient with direct contact information for care management team; Advised patient, providing education and rationale, to monitor blood pressure daily and record, calling PCP for findings outside established parameters;  Reviewed scheduled/upcoming provider appointments including:  Sees pcp on a regular basis. Knows to call for changes.  Advised patient to discuss changes in blood pressures or heart health with provider; Provided education on prescribed diet heart healthy;  Discussed complications of poorly controlled blood pressure such as heart disease, stroke, circulatory complications, vision complications, kidney impairment, sexual dysfunction;  Screening for signs and symptoms of depression related to chronic disease state;  Assessed social determinant of health barriers;   Symptom Management: Take medications as prescribed   Attend all scheduled provider appointments Call provider office for new concerns or questions  call the Suicide and Crisis Lifeline: 988 call the [place] National Suicide Prevention Lifeline: 587-552-3493 or TTY: 808-731-4204 TTY 308-445-7026) to talk to a trained counselor call 1-800-273-TALK (toll free, 24 hour hotline) if experiencing a Mental Health or Behavioral Health Crisis  check blood pressure 3 times per week write blood pressure results in a log or diary learn about high blood pressure keep a  blood pressure log call doctor for signs and symptoms of high blood pressure develop an action plan for high blood pressure keep all doctor appointments take medications for blood pressure exactly as prescribed report new symptoms to your doctor eat more whole grains, fruits and vegetables, lean meats and healthy fats  Follow Up Plan: Telephone follow up appointment with care management team member scheduled for: 08-30-2022  at 0900 am           Our next appointment is by telephone on 08-30-2022  at 9 am  Please call the care guide team at [phone] if you need to cancel or reschedule your appointment.   If you are experiencing a Mental Health or Behavioral Health Crisis or need someone to talk to, please call the Suicide and Crisis Lifeline: 988 call the [place] National Suicide Prevention Lifeline: (207)631-3267 or TTY: 510-678-1593 TTY (402)637-1369) to talk to a trained counselor call 1-800-273-TALK (toll free, 24 hour hotline)   Patient verbalizes understanding of instructions and care plan provided today and agrees to view in MyChart. Active MyChart status and patient understanding of how to access instructions and care plan via MyChart confirmed with patient.     Telephone follow up appointment with care management team member scheduled for: 08-30-2022 at 0900 am  Alto Denver RN, MSN, CCM RN Care Manager  Chronic Care Management Direct Number: [phone]

## 2022-08-12 ENCOUNTER — Telehealth: Payer: Self-pay | Admitting: Family Medicine

## 2022-08-12 ENCOUNTER — Other Ambulatory Visit: Payer: Self-pay

## 2022-08-12 DIAGNOSIS — M159 Polyosteoarthritis, unspecified: Secondary | ICD-10-CM

## 2022-08-12 DIAGNOSIS — Z515 Encounter for palliative care: Secondary | ICD-10-CM

## 2022-08-12 DIAGNOSIS — G8929 Other chronic pain: Secondary | ICD-10-CM

## 2022-08-12 DIAGNOSIS — M545 Low back pain, unspecified: Secondary | ICD-10-CM

## 2022-08-12 NOTE — Telephone Encounter (Signed)
Pt's daughter states that [facility] does not have Hy[facility]codone in stock and is not sure when they will.  [facility] has it in stock and would like for the prescription to go there.    Thanks,   -Vernona Rieger

## 2022-08-12 NOTE — Telephone Encounter (Signed)
Medication Refill - Medication: HYDROcodone-acetaminophen (NORCO/VICODIN) 5-325 MG tablet [[id number]]   Pts daughter is calling to report that the pt is out of the above medication. [facility] - pharmacy is out of stock of the drug. Requesting if medication can be sent to the below pharmacy- where the medication is in stock.  Daughter, Velna Hatchet is requesting a CB that medication has been sent. CB- [phone]  Has the patient contacted their pharmacy? Yes.   (Agent: If no, request that the patient contact the pharmacy for the refill. If patient does not wish to contact the pharmacy document the reason why and proceed with request.) (Agent: If yes, when and what did the pharmacy advise?)  Preferred Pharmacy (with phone number or street name): [facility] - [place], [place] - [address] Phone: [phone]  Fax: [phone]   Has the patient been seen for an appointment in the last year OR does the patient have an upcoming appointment? Yes.    Agent: Please be advised that RX refills may take up to 3 business days. We ask that you follow-up with your pharmacy.

## 2022-08-13 ENCOUNTER — Other Ambulatory Visit: Payer: Self-pay | Admitting: Family Medicine

## 2022-08-13 DIAGNOSIS — G8929 Other chronic pain: Secondary | ICD-10-CM

## 2022-08-13 DIAGNOSIS — M159 Polyosteoarthritis, unspecified: Secondary | ICD-10-CM

## 2022-08-13 DIAGNOSIS — Z515 Encounter for palliative care: Secondary | ICD-10-CM

## 2022-08-13 DIAGNOSIS — M545 Low back pain, unspecified: Secondary | ICD-10-CM

## 2022-08-13 MED ORDER — HYDROCODONE-ACETAMINOPHEN 5-325 MG PO TABS
1.0000 | ORAL_TABLET | Freq: Four times a day (QID) | ORAL | 0 refills | Status: DC | PRN
Start: 2022-08-13 — End: 2022-09-16

## 2022-08-13 NOTE — Telephone Encounter (Signed)
Medication sent to [facility]

## 2022-08-13 NOTE — Telephone Encounter (Signed)
Left message advising pt's daughter   Thanks,   Vernona Rieger

## 2022-08-29 ENCOUNTER — Emergency Department: Payer: Medicare Other

## 2022-08-29 ENCOUNTER — Other Ambulatory Visit: Payer: Self-pay

## 2022-08-29 ENCOUNTER — Observation Stay: Payer: Medicare Other

## 2022-08-29 ENCOUNTER — Observation Stay
Admission: EM | Admit: 2022-08-29 | Discharge: 2022-08-31 | Disposition: A | Discharge status: Home Health | Payer: Medicare Other | Attending: Osteopathic Medicine | Admitting: Osteopathic Medicine

## 2022-08-29 DIAGNOSIS — R531 Weakness: Principal | ICD-10-CM

## 2022-08-29 DIAGNOSIS — Z87891 Personal history of nicotine dependence: Secondary | ICD-10-CM | POA: Insufficient documentation

## 2022-08-29 DIAGNOSIS — K219 Gastro-esophageal reflux disease without esophagitis: Secondary | ICD-10-CM

## 2022-08-29 DIAGNOSIS — R109 Unspecified abdominal pain: Secondary | ICD-10-CM | POA: Diagnosis not present

## 2022-08-29 DIAGNOSIS — G20C Parkinsonism, unspecified: Secondary | ICD-10-CM | POA: Diagnosis not present

## 2022-08-29 DIAGNOSIS — Z79899 Other long term (current) drug therapy: Secondary | ICD-10-CM | POA: Diagnosis not present

## 2022-08-29 DIAGNOSIS — G3183 Dementia with Lewy bodies: Secondary | ICD-10-CM

## 2022-08-29 DIAGNOSIS — I160 Hypertensive urgency: Secondary | ICD-10-CM | POA: Diagnosis not present

## 2022-08-29 DIAGNOSIS — F02818 Dementia in other diseases classified elsewhere, unspecified severity, with other behavioral disturbance: Secondary | ICD-10-CM

## 2022-08-29 DIAGNOSIS — R112 Nausea with vomiting, unspecified: Secondary | ICD-10-CM | POA: Insufficient documentation

## 2022-08-29 DIAGNOSIS — I10 Essential (primary) hypertension: Secondary | ICD-10-CM | POA: Diagnosis not present

## 2022-08-29 DIAGNOSIS — Z1152 Encounter for screening for COVID-19: Secondary | ICD-10-CM | POA: Diagnosis not present

## 2022-08-29 LAB — URINALYSIS, ROUTINE W REFLEX MICROSCOPIC
Bilirubin Urine: NEGATIVE
Glucose, UA: NEGATIVE mg/dL
Hgb urine dipstick: NEGATIVE
Ketones, ur: 5 mg/dL — AB
Leukocytes,Ua: NEGATIVE
Nitrite: NEGATIVE
Protein, ur: NEGATIVE mg/dL
Specific Gravity, Urine: 1.018 (ref 1.005–1.030)
pH: 7 (ref 5.0–8.0)

## 2022-08-29 LAB — CBC
HCT: 38.9 % — ABNORMAL LOW (ref 39.0–52.0)
Hemoglobin: 12.7 g/dL — ABNORMAL LOW (ref 13.0–17.0)
MCH: 30.2 pg (ref 26.0–34.0)
MCHC: 32.6 g/dL (ref 30.0–36.0)
MCV: 92.6 fL (ref 80.0–100.0)
Platelets: 212 10*3/uL (ref 150–400)
RBC: 4.2 MIL/uL — ABNORMAL LOW (ref 4.22–5.81)
RDW: 14.3 % (ref 11.5–15.5)
WBC: 7 10*3/uL (ref 4.0–10.5)
nRBC: 0 % (ref 0.0–0.2)

## 2022-08-29 LAB — BASIC METABOLIC PANEL
Anion gap: 5 (ref 5–15)
BUN: 20 mg/dL (ref 8–23)
CO2: 25 mmol/L (ref 22–32)
Calcium: 8.1 mg/dL — ABNORMAL LOW (ref 8.9–10.3)
Chloride: 105 mmol/L (ref 98–111)
Creatinine, Ser: 0.96 mg/dL (ref 0.61–1.24)
GFR, Estimated: >60 mL/min (ref >60)
Glucose, Bld: 117 mg/dL — ABNORMAL HIGH (ref 70–99)
Potassium: 3.9 mmol/L (ref 3.5–5.1)
Sodium: 135 mmol/L (ref 135–145)

## 2022-08-29 LAB — TROPONIN I (HIGH SENSITIVITY): Troponin I (High Sensitivity): 12 ng/L (ref <18)

## 2022-08-29 LAB — SARS CORONAVIRUS 2 BY RT PCR: SARS Coronavirus 2 by RT PCR: NEGATIVE

## 2022-08-29 MED ORDER — ACETAMINOPHEN 325 MG PO TABS
650.0000 mg | ORAL_TABLET | Freq: Four times a day (QID) | ORAL | Status: DC | PRN
  Administered 2022-08-29: 650 mg via ORAL
  Filled 2022-08-29: qty 2

## 2022-08-29 MED ORDER — OMEGA-3-ACID ETHYL ESTERS 1 G PO CAPS
1.0000 g | ORAL_CAPSULE | Freq: Every day | ORAL | Status: DC
  Administered 2022-08-30 – 2022-08-31 (×2): 1 g via ORAL
  Filled 2022-08-29 (×3): qty 1

## 2022-08-29 MED ORDER — ENOXAPARIN SODIUM 40 MG/0.4ML IJ SOSY
40.0000 mg | PREFILLED_SYRINGE | INTRAMUSCULAR | Status: DC
  Administered 2022-08-30 – 2022-08-31 (×2): 40 mg via SUBCUTANEOUS
  Filled 2022-08-29 (×2): qty 0.4

## 2022-08-29 MED ORDER — MELATONIN 5 MG PO TABS
5.0000 mg | ORAL_TABLET | Freq: Every day | ORAL | Status: DC
  Administered 2022-08-29 – 2022-08-30 (×2): 5 mg via ORAL
  Filled 2022-08-29 (×2): qty 1

## 2022-08-29 MED ORDER — LORAZEPAM 1 MG PO TABS
1.0000 mg | ORAL_TABLET | Freq: Once | ORAL | Status: AC
  Administered 2022-08-29: 1 mg via ORAL
  Filled 2022-08-29: qty 1

## 2022-08-29 MED ORDER — PANTOPRAZOLE SODIUM 40 MG PO TBEC
40.0000 mg | DELAYED_RELEASE_TABLET | Freq: Every day | ORAL | Status: DC
  Administered 2022-08-30 – 2022-08-31 (×2): 40 mg via ORAL
  Filled 2022-08-29 (×2): qty 1

## 2022-08-29 MED ORDER — HYDROCODONE-ACETAMINOPHEN 5-325 MG PO TABS
1.0000 | ORAL_TABLET | Freq: Four times a day (QID) | ORAL | Status: DC | PRN
  Administered 2022-08-29 – 2022-08-30 (×2): 1 via ORAL
  Filled 2022-08-29 (×2): qty 1

## 2022-08-29 MED ORDER — SODIUM CHLORIDE 0.9 % IV SOLN: INTRAVENOUS | Status: DC

## 2022-08-29 MED ORDER — LABETALOL HCL 5 MG/ML IV SOLN
20.0000 mg | INTRAVENOUS | Status: DC | PRN
  Administered 2022-08-30 – 2022-08-31 (×2): 20 mg via INTRAVENOUS
  Filled 2022-08-29 (×2): qty 4

## 2022-08-29 MED ORDER — ACETAMINOPHEN 650 MG RE SUPP: 650.0000 mg | Freq: Four times a day (QID) | RECTAL | Status: DC | PRN

## 2022-08-29 MED ORDER — VITAMIN C 500 MG PO TABS
500.0000 mg | ORAL_TABLET | Freq: Every day | ORAL | Status: DC
  Administered 2022-08-30 – 2022-08-31 (×2): 500 mg via ORAL
  Filled 2022-08-29 (×2): qty 1

## 2022-08-29 MED ORDER — ONDANSETRON HCL 4 MG/2ML IJ SOLN: 4.0000 mg | Freq: Four times a day (QID) | INTRAMUSCULAR | Status: DC | PRN

## 2022-08-29 MED ORDER — ONDANSETRON HCL 4 MG PO TABS: 4.0000 mg | ORAL_TABLET | Freq: Four times a day (QID) | ORAL | Status: DC | PRN

## 2022-08-29 MED ORDER — RIVASTIGMINE TARTRATE 3 MG PO CAPS
3.0000 mg | ORAL_CAPSULE | Freq: Two times a day (BID) | ORAL | Status: DC
  Administered 2022-08-30 – 2022-08-31 (×4): 3 mg via ORAL
  Filled 2022-08-29 (×4): qty 1

## 2022-08-29 MED ORDER — ADULT MULTIVITAMIN W/MINERALS CH
1.0000 | ORAL_TABLET | Freq: Every day | ORAL | Status: DC
  Administered 2022-08-30 – 2022-08-31 (×2): 1 via ORAL
  Filled 2022-08-29 (×2): qty 1

## 2022-08-29 MED ORDER — ONDANSETRON 4 MG PO TBDP: 4.0000 mg | ORAL_TABLET | Freq: Three times a day (TID) | ORAL | Status: DC | PRN

## 2022-08-29 MED ORDER — TRAZODONE HCL 50 MG PO TABS
25.0000 mg | ORAL_TABLET | Freq: Every evening | ORAL | Status: DC | PRN
  Administered 2022-08-29: 25 mg via ORAL
  Filled 2022-08-29: qty 1

## 2022-08-29 MED ORDER — CARBIDOPA-LEVODOPA ER 50-200 MG PO TBCR
1.0000 | EXTENDED_RELEASE_TABLET | Freq: Four times a day (QID) | ORAL | Status: DC
  Administered 2022-08-29 – 2022-08-31 (×8): 1 via ORAL
  Filled 2022-08-29 (×10): qty 1

## 2022-08-29 MED ORDER — VITAMIN D 25 MCG (1000 UNIT) PO TABS
1000.0000 [IU] | ORAL_TABLET | Freq: Every day | ORAL | Status: DC
  Administered 2022-08-30 – 2022-08-31 (×2): 1000 [IU] via ORAL
  Filled 2022-08-29 (×2): qty 1

## 2022-08-29 MED ORDER — CO Q 10 100 MG PO CAPS: 200.0000 mg | ORAL_CAPSULE | Freq: Every day | ORAL | Status: DC

## 2022-08-29 MED ORDER — OLANZAPINE 5 MG PO TBDP: 2.5000 mg | ORAL_TABLET | Freq: Three times a day (TID) | ORAL | Status: DC | PRN

## 2022-08-29 MED ORDER — SERTRALINE HCL 50 MG PO TABS
50.0000 mg | ORAL_TABLET | Freq: Every day | ORAL | Status: DC
  Administered 2022-08-30 – 2022-08-31 (×2): 50 mg via ORAL
  Filled 2022-08-29 (×2): qty 1

## 2022-08-29 MED ORDER — ENALAPRIL MALEATE 10 MG PO TABS
5.0000 mg | ORAL_TABLET | Freq: Every day | ORAL | Status: DC
  Administered 2022-08-30 – 2022-08-31 (×2): 5 mg via ORAL
  Filled 2022-08-29: qty 1
  Filled 2022-08-29: qty 2
  Filled 2022-08-29: qty 1
  Filled 2022-08-29: qty 0.5

## 2022-08-29 MED ORDER — DICLOFENAC SODIUM 1 % EX GEL: 2.0000 g | Freq: Four times a day (QID) | CUTANEOUS | Status: DC | PRN

## 2022-08-29 MED ORDER — MAGNESIUM HYDROXIDE 400 MG/5ML PO SUSP: 30.0000 mL | Freq: Every day | ORAL | Status: DC | PRN

## 2022-08-29 MED ORDER — FUROSEMIDE 20 MG PO TABS
20.0000 mg | ORAL_TABLET | ORAL | Status: DC
  Administered 2022-08-30: 20 mg via ORAL
  Filled 2022-08-29: qty 1

## 2022-08-29 MED ORDER — SODIUM CHLORIDE 0.9 % IV BOLUS
500.0000 mL | Freq: Once | INTRAVENOUS | Status: AC
  Administered 2022-08-29: 500 mL via INTRAVENOUS

## 2022-08-29 MED ORDER — HALOPERIDOL LACTATE 5 MG/ML IJ SOLN: 2.0000 mg | Freq: Four times a day (QID) | INTRAMUSCULAR | Status: DC | PRN

## 2022-08-29 MED ORDER — IOHEXOL 300 MG/ML  SOLN
100.0000 mL | Freq: Once | INTRAMUSCULAR | Status: AC | PRN
  Administered 2022-08-29: 100 mL via INTRAVENOUS

## 2022-08-29 NOTE — ED Provider Notes (Signed)
[facility] Provider Note    Event Date/Time   First MD Initiated Contact with Patient 08/29/22 1647     (approximate)   History   Possible UTI    HPI  Randy [name] is a 88 y.o. male with history of neurodegenerative disorder (likely Lewy body dementia), parkinsonism, asthma, arthritis, hypertension, hyperlipidemia, and GERD who presents with abnormal urine at home as well as increased generalized weakness over the last several days.  The historian is the patient's daughter.  She states that the patient normally is able to get up and move mostly on his own but has been unable to do so the last couple days.  However his mental status is at baseline, which is confused.  I reviewed the past medical records.  The patient's most recent outpatient encounter was with Dr. Richardo Hanks from urology for follow-up of urinary retention.  He has no recent ED visits or admissions.   Physical Exam   Triage Vital Signs: ED Triage Vitals  Encounter Vitals Group     BP 08/29/22 1436 (!) 163/84     Systolic BP Percentile --      Diastolic BP Percentile --      Pulse Rate 08/29/22 1436 76     Resp 08/29/22 1436 18     Temp 08/29/22 1436 97.8 F (36.6 C)     Temp Source 08/29/22 1436 Oral     SpO2 08/29/22 1436 95 %     Weight --      Height --      Head Circumference --      Peak Flow --      Pain Score 08/29/22 1435 0     Pain Loc --      Pain Education --      Exclude from Growth Chart --     Most recent vital signs: Vitals:   08/29/22 1830 08/29/22 1837  BP: (!) 156/135   Pulse:    Resp:    Temp:  98 F (36.7 C)  SpO2:       General: Alert, confused, no distress.  CV:  Good peripheral perfusion.  Resp:  Normal effort.  Abd:  Soft and nontender.  No distention. Other:  EOMI.  PERRLA.  No facial droop.  Normal speech.  Motor intact in all extremities.   ED Results / Procedures / Treatments   Labs (all labs ordered are listed, but only abnormal  results are displayed) Labs Reviewed  URINALYSIS, ROUTINE W REFLEX MICROSCOPIC - Abnormal; Notable for the following components:      Result Value   Color, Urine YELLOW (*)    APPearance CLEAR (*)    Ketones, ur 5 (*)    All other components within normal limits  CBC - Abnormal; Notable for the following components:   RBC 4.20 (*)    Hemoglobin 12.7 (*)    HCT 38.9 (*)    All other components within normal limits  BASIC METABOLIC PANEL - Abnormal; Notable for the following components:   Glucose, Bld 117 (*)    Calcium 8.1 (*)    All other components within normal limits  SARS CORONAVIRUS 2 BY RT PCR  TROPONIN I (HIGH SENSITIVITY)  TROPONIN I (HIGH SENSITIVITY)     EKG     RADIOLOGY  Chest x-ray: I independently viewed and interpreted the images; there is no focal consolidation or edema  CT head: No ICH or other acute abnormality  PROCEDURES:  Critical Care performed:  No  Procedures   MEDICATIONS ORDERED IN ED: Medications  LORazepam (ATIVAN) tablet 1 mg (1 mg Oral Given 08/29/22 1833)  sodium chloride 0.9 % bolus 500 mL (500 mLs Intravenous Bolus from Bag 08/29/22 2016)     IMPRESSION / MDM / ASSESSMENT AND PLAN / ED COURSE  I reviewed the triage vital signs and the nursing notes.  88 year old male with PMH as noted above presents with generalized weakness over the last few days and an abnormal urine dipstick at home.  On exam his blood pressure is elevated.  Neurologic exam is nonfocal.  His mental status is at baseline per his family member.  Differential diagnosis includes, but is not limited to, UTI, dehydration, electrolyte abnormality, AKI, other infection.  We will obtain basic labs, urinalysis, and reassess.  Patient's presentation is most consistent with acute presentation with potential threat to life or bodily function.  ----------------------------------------- 7:44 PM on 08/29/2022 -----------------------------------------  Urinalysis is  negative.  BMP shows no acute findings.  CBC shows no leukocytosis or anemia.  I have added on a troponin to rule out cardiac ischemia, COVID swab, chest x-ray since the family are now reports that the patient has been coughing, and a CT head to rule out stroke although my clinical suspicion for this is lower.  ----------------------------------------- 9:00 PM on 08/29/2022 -----------------------------------------  Chest x-ray and CT are negative.  COVID is still pending.  I consulted Dr. Arville Care from the hospitalist service; based on our discussion he agrees to evaluate the patient for admission.   FINAL CLINICAL IMPRESSION(S) / ED DIAGNOSES   Final diagnoses:  Weakness     Rx / DC Orders   ED Discharge Orders     None        Note:  This document was prepared using Dragon voice recognition software and may include unintentional dictation errors.    Dionne Bucy, MD 08/29/22 2100

## 2022-08-29 NOTE — Assessment & Plan Note (Signed)
-   The patient will be admitted to a medical bed. - His COVID-19 PCR is currently pending. - He will be hydrated with IV normal saline. - PT consult will be obtained to assess his ambulation. - We will follow neurochecks every 4 hours for 24 hours.

## 2022-08-29 NOTE — ED Notes (Signed)
First Nurse Note: Patient to ED via [facility] from home. Patient had home health paramedic at the house- ran a UA that came back pos for UTI. Needing antibiotic. Aox1- hx of dementia and Parkinson's. VS WNL

## 2022-08-29 NOTE — Assessment & Plan Note (Signed)
-   We will continue Exelon patch as well as Sinemet CR.

## 2022-08-29 NOTE — Assessment & Plan Note (Signed)
-   We will continue PPI therapy 

## 2022-08-29 NOTE — ED Triage Notes (Addendum)
Pt to ED via POV from home. Pt had a home health aide that ran a UA that showed possible UTI. Pt sent to ED for antibiotic. Pt A&Ox1. EMS states hx of dementia and Parkinson's. Pt denies pain.  Pt's home aide wrote a note stating hx of UTI with symptoms x1 wk. Confusion, difficulty voiding, malodorous, loss of appetite.

## 2022-08-29 NOTE — Assessment & Plan Note (Addendum)
-   We will continue his antihypertensive therapy. - We will place him on as needed IV labetalol.

## 2022-08-29 NOTE — Assessment & Plan Note (Signed)
-   We will continue his Exelon and place him on as needed Zyprexa. - We will continue Zoloft for associated depression.

## 2022-08-29 NOTE — H&P (Addendum)
[name]   PATIENT NAME: Randy [name]    MR#:  [id number]  DATE OF BIRTH:  May 24, 1934  DATE OF ADMISSION:  08/29/2022  PRIMARY CARE PHYSICIAN: Randy Cords, DO   Patient is coming from: Home  REQUESTING/REFERRING PHYSICIAN: Dionne Bucy, MD  CHIEF COMPLAINT:   Chief Complaint  Patient presents with   Possible UTI     HISTORY OF PRESENT ILLNESS:  Randy [name] is a 88 y.o. Caucasian male with medical history significant for Lewy body dementia, hypertension dyslipidemia, GERD, osteoarthritis and BPH, who presented to the ER with acute onset of generalized weakness and suspected UTI.  The patient is able to ambulate at baseline and became significantly weak he could not stand or walk, per his daughter. His home health nurse had a urine dipstick that was suspicious for UTI.  The patient has been having progressive generalized weakness over the last several days.  No mental status change beyond his baseline confusion with Lewy body dementia.  No fever or chills at home but he had a temp of 99.6 here.  He had nausea and vomiting once today without diarrhea however with mild lower abdominal pain per his daughter.  No chest pain or palpitations.  He is been having mild dry cough without wheezing or dyspnea.   ED Course: When he went to the ER, BP was 163/84 and later 190/89 with otherwise normal vital signs.  Later on temperature was 99.6 after Tylenol 97.9.  Labs revealed unremarkable BMP.  CBC showed mild anemia with hemoglobin 12.7 hematocrit 38.9 better than previous levels.  UA showed 5 ketones and was otherwise unremarkable.  EKG as reviewed by me : EKG showed sinus rhythm with a rate of 89 with poor R progression nonspecific IVCD with LAD and LVH. Imaging: Noncontrasted head CT scan revealed no acute intracranial abnormalities.  It showed mucosal thickening and bubbly fluid in the ethmoid air cells that can be seen with acute sinusitis.  Portable chest x-ray  showed no acute chest findings.  It showed stable cardiomegaly and aortic atherosclerosis and chronic upper lobe interstitial scarring as well as chronic rotator cuff arthropathy. Abdominal and pelvic CT scan revealed: 1. No CT evidence for acute intra-abdominal or pelvic abnormality. 2. Numerous simple and non simple cysts within the bilateral kidneys, overall increased compared to prior. When the patient is clinically stable and able to follow directions and hold their breath (preferably as an outpatient) further evaluation with dedicated abdominal MRI should be considered. Bladder diverticula 3. Trace right-sided pleural effusion. 4. Cardiomegaly. 5. Small hiatal hernia. 6. Status post cholecystectomy and prostatectomy. 7. Aortic atherosclerosis.  The patient was given 1 mg of IV Ativan for agitation as well as 500 mL IV normal saline bolus.  He will be admitted to a medical bed for further evaluation and management. PAST MEDICAL HISTORY:   Past Medical History:  Diagnosis Date   Arthritis    GERD (gastroesophageal reflux disease)    Hyperlipidemia    Hypertension    Lewy body dementia (HCC)    Osteoporosis    Prostate enlargement     PAST SURGICAL HISTORY:   Past Surgical History:  Procedure Laterality Date   CHOLECYSTECTOMY N/A 09/10/2018   Procedure: LAPAROSCOPIC CHOLECYSTECTOMY;  Surgeon: Sung Amabile, DO;  Location: [facility] ORS;  Service: General;  Laterality: N/A;   CYSTOSCOPY WITH LITHOLAPAXY N/A 10/20/2020   Procedure: CYSTOSCOPY WITH LITHOLAPAXY;  Surgeon: Randy Come, MD;  Location: [facility] ORS;  Service: Urology;  Laterality: N/A;   ESOPHAGOGASTRODUODENOSCOPY (EGD) WITH PROPOFOL N/A 10/11/2021   Procedure: ESOPHAGOGASTRODUODENOSCOPY (EGD) WITH PROPOFOL;  Surgeon: Randy Reil, MD;  Location: [facility] ENDOSCOPY;  Service: Gastroenterology;  Laterality: N/A;   GANGLION CYST EXCISION Left 2013   Left upper arm   HERNIA REPAIR N/A 2008   MIdline, ventral acquired  hernia repair   HOLEP-LASER ENUCLEATION OF THE PROSTATE WITH MORCELLATION N/A 11/06/2018   Procedure: HOLEP-LASER ENUCLEATION OF THE PROSTATE WITH MORCELLATION;  Surgeon: Randy Come, MD;  Location: [facility] ORS;  Service: Urology;  Laterality: N/A;   HOLEP-LASER ENUCLEATION OF THE PROSTATE WITH MORCELLATION N/A 10/20/2020   Procedure: HOLEP-LASER ENUCLEATION OF THE PROSTATE WITH MORCELLATION;  Surgeon: Randy Come, MD;  Location: [facility] ORS;  Service: Urology;  Laterality: N/A;    SOCIAL HISTORY:   Social History   Tobacco Use   Smoking status: Former    Current packs/day: 0.00    Average packs/day: 1 pack/day for 12.0 years (12.0 ttl pk-yrs)    Types: Cigarettes    Start date: 29    Quit date: 1968    Years since quitting: 56.6   Smokeless tobacco: Never  Substance Use Topics   Alcohol use: No    FAMILY HISTORY:   Family History  Problem Relation Age of Onset   Breast cancer Mother    Lung cancer Father    Heart disease Brother    Leukemia Brother    Prostate cancer Neg Hx    Colon cancer Neg Hx    Bladder Cancer Neg Hx    Kidney cancer Neg Hx     DRUG ALLERGIES:   Allergies  Allergen Reactions   Antazoline Diarrhea   Antihistamines, Chlorpheniramine-Type Other (See Comments)    Can't urinate    REVIEW OF SYSTEMS:   ROS As per history of present illness. All pertinent systems were reviewed above. Constitutional, HEENT, cardiovascular, respiratory, GI, GU, musculoskeletal, neuro, psychiatric, endocrine, integumentary and hematologic systems were reviewed and are otherwise negative/unremarkable except for positive findings mentioned above in the HPI.   MEDICATIONS AT HOME:   Prior to Admission medications   Medication Sig Start Date End Date Taking? Authorizing Provider  acetaminophen (TYLENOL) 500 MG tablet Take 1,000 mg by mouth every 8 (eight) hours as needed.    [provider]  carbidopa-levodopa (SINEMET CR) 50-200 MG tablet Take 1 tablet  by mouth 4 (four) times daily. 06/22/20   [provider]  cholecalciferol (VITAMIN D) 1000 units tablet Take 1,000 Units by mouth daily.    [provider]  Coenzyme Q10 (CO Q 10) 100 MG CAPS Take 200 mg by mouth daily.    [provider]  diclofenac Sodium (VOLTAREN) 1 % GEL APPLY 2 G TOPICALLY 4 (FOUR) TIMES DAILY AS NEEDED. TO KNEE ARTHRITIS PAIN 09/28/20   Randy Cords, DO  enalapril (VASOTEC) 5 MG tablet TAKE 1 TABLET (5 MG TOTAL) BY MOUTH DAILY. 01/28/22   Karamalegos, Netta Neat, DO  furosemide (LASIX) 20 MG tablet Take 1 tablet (20 mg total) by mouth every other day. Patient takes Mondays, Wednesdays, and Fridays. 06/04/22   Debbe Odea, MD  HYDROcodone-acetaminophen (NORCO/VICODIN) 5-325 MG tablet Take 1 tablet by mouth every 6 (six) hours as needed for moderate pain. 08/13/22   Lorre Munroe, NP  melatonin 5 MG TABS Take 1 tablet (5 mg total) by mouth at bedtime. 07/31/21   Arnetha Courser, MD  Multiple Vitamin (MULTIVITAMIN WITH MINERALS) TABS tablet Take 1 tablet by mouth daily.  10/12/19   Alford Highland, MD  Omega-3 Fatty Acids (FISH OIL) 1200 MG CAPS Take 1,200 mg by mouth in the morning, at noon, in the evening, and at bedtime.    [provider]  omeprazole (PRILOSEC) 20 MG capsule Take 20 mg by mouth 2 (two) times daily.    [provider]  ondansetron (ZOFRAN-ODT) 4 MG disintegrating tablet Take 1 tablet (4 mg total) by mouth every 8 (eight) hours as needed for nausea or vomiting. 01/04/22   Althea Charon, Netta Neat, DO  rivastigmine (EXELON) 3 MG capsule Take 3 mg by mouth 2 (two) times daily.    [provider]  sertraline (ZOLOFT) 50 MG tablet Take 50 mg by mouth daily. 06/22/20   [provider]  vitamin C (ASCORBIC ACID) 500 MG tablet Take 500 mg by mouth daily.    [provider]      VITAL SIGNS:  Blood pressure (!) 161/91, pulse 87, temperature 97.9 F (36.6 C), temperature source  Axillary, resp. rate (!) 21, height 6' (1.829 m), weight 94.3 kg, SpO2 92%.  PHYSICAL EXAMINATION:  Physical Exam  GENERAL:  88 y.o.-year-old Caucasian male patient lying in the bed with no acute distress.  He was slightly restless. EYES: Pupils equal, round, reactive to light and accommodation. No scleral icterus. Extraocular muscles intact.  HEENT: Head atraumatic, normocephalic. Oropharynx and nasopharynx clear.  NECK:  Supple, no jugular venous distention. No thyroid enlargement, no tenderness.  LUNGS: Normal breath sounds bilaterally, no wheezing, rales,rhonchi or crepitation. No use of accessory muscles of respiration.  CARDIOVASCULAR: Regular rate and rhythm, S1, S2 normal. No murmurs, rubs, or gallops.  ABDOMEN: Soft, nondistended, nontender. Bowel sounds present. No organomegaly or mass.  EXTREMITIES: No pedal edema, cyanosis, or clubbing.  NEUROLOGIC: Cranial nerves II through XII are intact. Muscle strength 5/5 in all extremities. Sensation intact. Gait not checked.  He had occasional tremors. PSYCHIATRIC: The patient is alert and oriented x 3.  Normal affect and good eye contact. SKIN: No obvious rash, lesion, or ulcer.   LABORATORY PANEL:   CBC Recent Labs  Lab 08/29/22 1444  WBC 7.0  HGB 12.7*  HCT 38.9*  PLT 212   ------------------------------------------------------------------------------------------------------------------  Chemistries  Recent Labs  Lab 08/29/22 1444  NA 135  K 3.9  CL 105  CO2 25  GLUCOSE 117*  BUN 20  CREATININE 0.96  CALCIUM 8.1*   ------------------------------------------------------------------------------------------------------------------  Cardiac Enzymes No results for input(s): "TROPONINI" in the last 168 hours. ------------------------------------------------------------------------------------------------------------------  RADIOLOGY:  CT ABDOMEN PELVIS W CONTRAST  Result Date: 08/29/2022 CLINICAL DATA:  Possible UTI  abdomen pain EXAM: CT ABDOMEN AND PELVIS WITH CONTRAST TECHNIQUE: Multidetector CT imaging of the abdomen and pelvis was performed using the standard protocol following bolus administration of intravenous contrast. RADIATION DOSE REDUCTION: This exam was performed according to the departmental dose-optimization program which includes automated exposure control, adjustment of the mA and/or kV according to patient size and/or use of iterative reconstruction technique. CONTRAST:  OMNIPAQUE IOHEXOL 300 MG/ML  SOLN COMPARISON:  CT 09/08/2018 FINDINGS: Lower chest: Lung bases demonstrate cardiomegaly. Small hiatal hernia. Trace right-sided pleural effusion. Hepatobiliary: Status post cholecystectomy.  No biliary dilatation. Pancreas: Unremarkable. No pancreatic ductal dilatation or surrounding inflammatory changes. Spleen: Normal in size without focal abnormality. Adrenals/Urinary Tract: Adrenal glands are normal. Numerous simple and non simple cysts within the bilateral kidneys, overall increased compared to prior. Dominant slightly dense parapelvic lesion on the right measures up to 5.7 cm. Dominant simple cyst midpole left  kidney measures 12.5 cm. Increased size of hyperdense cystic lesion mid to lower left kidney measuring 5.4 cm. Bladder diverticula. Stomach/Bowel: The stomach is nonenlarged. There is no acute bowel wall thickening. Negative appendix. Slight increased bowel gas throughout. Vascular/Lymphatic: Moderate aortic atherosclerosis. No aneurysm. No suspicious lymph nodes. Reproductive: Prostatectomy Other: Negative for pelvic effusion or free air. Rim calcified area between the bladder and rectum, possibly dystrophic and related to prior surgery. Musculoskeletal: No acute osseous abnormality. Severe degenerative changes of the left hip. Mild chronic osseous deformity of the left iliac bone with fusion of the left SI joint. Partial ankylosis across the right 1 aspects of the lower lumbar vertebral  bodies. IMPRESSION: 1. No CT evidence for acute intra-abdominal or pelvic abnormality. 2. Numerous simple and non simple cysts within the bilateral kidneys, overall increased compared to prior. When the patient is clinically stable and able to follow directions and hold their breath (preferably as an outpatient) further evaluation with dedicated abdominal MRI should be considered. Bladder diverticula 3. Trace right-sided pleural effusion. 4. Cardiomegaly. 5. Small hiatal hernia. 6. Status post cholecystectomy and prostatectomy. 7. Aortic atherosclerosis. Aortic Atherosclerosis (ICD10-I70.0). Electronically Signed   By: Jasmine Pang M.D.   On: 08/29/2022 23:33   DG Chest Port 1 View  Result Date: 08/29/2022 CLINICAL DATA:  10031 with cough. EXAM: PORTABLE CHEST 1 VIEW COMPARISON:  CTA chest 02/05/2022 FINDINGS: There are coarse interstitial changes in the upper lobes where the previous study demonstrated confluent ground-glass opacities. This is most consistent with post pneumonic interstitial scarring. Allowing patient obliquity to the left, no active lung infiltrate is suspected. There is no substantial pleural effusion. Mild-to-moderate cardiomegaly appears similar. No vascular congestion is seen. Stable mediastinum. There is aortic tortuosity, patchy calcification, and ectasia. Thoracic spondylosis and osteopenia. No new osseous findings. Chronic rotator cuff arthropathy. IMPRESSION: 1. No evidence of acute chest findings. Stable cardiomegaly and aortic atherosclerosis. 2. Chronic upper lobe interstitial scarring. No edema or substantial pleural effusion. 3. Chronic rotator cuff arthropathy. Electronically Signed   By: Almira Bar M.D.   On: 08/29/2022 20:38   CT Head Wo Contrast  Result Date: 08/29/2022 CLINICAL DATA:  Altered mental status EXAM: CT HEAD WITHOUT CONTRAST TECHNIQUE: Contiguous axial images were obtained from the base of the skull through the vertex without intravenous contrast.  RADIATION DOSE REDUCTION: This exam was performed according to the departmental dose-optimization program which includes automated exposure control, adjustment of the mA and/or kV according to patient size and/or use of iterative reconstruction technique. COMPARISON:  07/27/2021 FINDINGS: Evaluation is somewhat limited by motion artifact. Brain: No evidence of acute infarction, hemorrhage, mass, mass effect, or midline shift. No hydrocephalus or extra-axial fluid collection. Vascular: No hyperdense vessel. Skull: Negative for fracture or focal lesion. Sinuses/Orbits: Mucosal thickening and bubbly fluid in the ethmoid air cells. Status post bilateral lens replacements. Other: The mastoid air cells are well aerated. IMPRESSION: 1. No acute intracranial process. 2. Mucosal thickening and bubbly fluid in the ethmoid air cells, which can be seen in the setting of acute sinusitis. Correlate with symptoms. Electronically Signed   By: Wiliam Ke M.D.   On: 08/29/2022 20:27      IMPRESSION AND PLAN:  Assessment and Plan: * Generalized weakness - The patient will be admitted to a medical bed. - His COVID-19 PCR is currently pending. - He will be hydrated with IV normal saline. - PT consult will be obtained to assess his ambulation. - We will follow neurochecks every 4 hours for  24 hours.  Hypertensive urgency - We will continue his antihypertensive therapy. - We will place him on as needed IV labetalol.  Parkinsonism - We will continue Exelon patch as well as Sinemet CR.  Abdominal pain - This was associated with nausea and vomiting. - Abdominal pelvic CT scan was unremarkable. - Pain management will be provided.  Lewy body dementia with behavioral disturbance (HCC) - We will continue his Exelon and place him on as needed Zyprexa. - We will continue Zoloft for associated depression.  GERD without esophagitis - We will continue PPI therapy.    DVT prophylaxis: Lovenox.  Advanced Care  Planning:  Code Status: The patient is DNR only. Family Communication:  The plan of care was discussed in details with the patient (and family). I answered all questions. The patient agreed to proceed with the above mentioned plan. Further management will depend upon hospital course. Disposition Plan: Back to previous home environment Consults called: none.  All the records are reviewed and case discussed with ED provider.  Status is: Observation   I certify that at the time of admission, it is my clinical judgment that the patient will require hospital care extending less than 2 midnights.                            Dispo: The patient is from: Home              Anticipated d/c is to: Home              Patient currently is not medically stable to d/c.              Difficult to place patient: No  Hannah Beat M.D on 08/30/2022 at 2:06 AM  [facility]   From 7 PM-7 AM, contact night-coverage [email or web address]  CC: Primary care physician; Randy Cords, DO

## 2022-08-30 ENCOUNTER — Telehealth: Payer: Self-pay

## 2022-08-30 ENCOUNTER — Encounter: Payer: Self-pay | Admitting: Family Medicine

## 2022-08-30 ENCOUNTER — Other Ambulatory Visit: Payer: Medicare Other

## 2022-08-30 DIAGNOSIS — R531 Weakness: Secondary | ICD-10-CM | POA: Diagnosis not present

## 2022-08-30 DIAGNOSIS — Z515 Encounter for palliative care: Secondary | ICD-10-CM

## 2022-08-30 DIAGNOSIS — R109 Unspecified abdominal pain: Secondary | ICD-10-CM

## 2022-08-30 LAB — URINALYSIS, COMPLETE (UACMP) WITH MICROSCOPIC
Bacteria, UA: NONE SEEN
Bilirubin Urine: NEGATIVE
Glucose, UA: NEGATIVE mg/dL
Hgb urine dipstick: NEGATIVE
Ketones, ur: 5 mg/dL — AB
Leukocytes,Ua: NEGATIVE
Nitrite: NEGATIVE
Protein, ur: NEGATIVE mg/dL
Specific Gravity, Urine: 1.017 (ref 1.005–1.030)
Squamous Epithelial / HPF: NONE SEEN /HPF (ref 0–5)
pH: 7 (ref 5.0–8.0)

## 2022-08-30 LAB — BASIC METABOLIC PANEL
Anion gap: 12 (ref 5–15)
BUN: 19 mg/dL (ref 8–23)
CO2: 22 mmol/L (ref 22–32)
Calcium: 8.3 mg/dL — ABNORMAL LOW (ref 8.9–10.3)
Chloride: 105 mmol/L (ref 98–111)
Creatinine, Ser: 0.78 mg/dL (ref 0.61–1.24)
GFR, Estimated: >60 mL/min (ref >60)
Glucose, Bld: 99 mg/dL (ref 70–99)
Potassium: 3.5 mmol/L (ref 3.5–5.1)
Sodium: 139 mmol/L (ref 135–145)

## 2022-08-30 LAB — CBC
HCT: 36.4 % — ABNORMAL LOW (ref 39.0–52.0)
Hemoglobin: 11.9 g/dL — ABNORMAL LOW (ref 13.0–17.0)
MCH: 29.8 pg (ref 26.0–34.0)
MCHC: 32.7 g/dL (ref 30.0–36.0)
MCV: 91.2 fL (ref 80.0–100.0)
Platelets: 190 10*3/uL (ref 150–400)
RBC: 3.99 MIL/uL — ABNORMAL LOW (ref 4.22–5.81)
RDW: 14.3 % (ref 11.5–15.5)
WBC: 6.8 10*3/uL (ref 4.0–10.5)
nRBC: 0 % (ref 0.0–0.2)

## 2022-08-30 LAB — TROPONIN I (HIGH SENSITIVITY): Troponin I (High Sensitivity): 14 ng/L (ref <18)

## 2022-08-30 MED ORDER — MORPHINE SULFATE (PF) 2 MG/ML IV SOLN: 2.0000 mg | INTRAVENOUS | Status: DC | PRN

## 2022-08-30 MED ORDER — HYDRALAZINE HCL 20 MG/ML IJ SOLN: 10.0000 mg | Freq: Four times a day (QID) | INTRAMUSCULAR | Status: DC | PRN

## 2022-08-30 NOTE — Hospital Course (Addendum)
Randy [name] is a 88 y.o. Caucasian male with medical history significant for Lewy body dementia, hypertension dyslipidemia, GERD, osteoarthritis and BPH, who presented to the ER with acute onset of generalized weakness and suspected UTI.  The patient is able to ambulate at baseline and became significantly weak he could not stand or walk, per his daughter. Also concern for bloody/dark urine.  08/22: in ED VSS, labs and imaging unremarkable, infectious workup unremarkable. Admitted for weakness.  08/23: PT/OT recs SNF rehab, concern though w/ swallowing difficulty. Long discussion w/ daughter re: decline over past few weeks are concerning for end-stage dementia. Will consult for palliative care to assist w/ GOC and discuss hospice options   Consultants:  Palliative care   Procedures: None       ASSESSMENT & PLAN:   Principal Problem:   Generalized weakness Active Problems:   Hypertensive urgency   Parkinsonism   GERD without esophagitis   Lewy body dementia with behavioral disturbance (HCC)   Abdominal pain   Generalized weakness Suspect advancing / end-stage dementia + parkinsonism  Palliative care consult   Hypertensive urgency Resolved Monitor VS  Parkinsonism continue Exelon patch as well as Sinemet CR.  GERD without esophagitis PPI .  Lewy body dementia with behavioral disturbance (HCC) continue Exelon  as needed Zyprexa. continue Zoloft for associated depression.  Abdominal pain nausea and vomiting. Abdominal pelvic CT scan was unremarkable. Pain management prn    DVT prophylaxis: lovenox  Pertinent IV fluids/nutrition: stopped IV fluids, able to take po  Central lines / invasive devices: none  Code Status: DNR ACP documentation reviewed: none on file   Current Admission Status: observation  TOC needs / Dispo plan: SNF vs hospice  Barriers to discharge / significant pending items: palliative care eval, not safe to go home at this point, working on  possible SNF placement but would like evaluation from hospice

## 2022-08-30 NOTE — TOC Initial Note (Signed)
Transition of Care [facility]) - Initial/Assessment Note    Patient Details  Name: Randy [name] MRN: [id number] Date of Birth: 1934-11-07  Transition of Care [facility]) CM/SW Contact:    Allena Katz, LCSW Phone Number: 08/30/2022, 3:09 PM  Clinical Narrative:  Pt admitted from home. Daughter reports that she does not want to send pt to SNF as she states she does not believe pt is at a place where he can get stronger. Pt Is active with [facility] through the patients insurance who sent the patient in per daughter. She states her son in law stays with patient during the day and she stays with him at night. Pt has a hospital bed, wheelchair, BSC and walker. Daughter reports she may be interested in a hoyer lift but wants to talk to her son in law and get back with me. She states pt is increasingly more confused and is now hallucinating which is not his baseline. CSW following for care plan updates.                   Expected Discharge Plan: Home w Home Health Services     Patient Goals and CMS Choice     Choice offered to / list presented to : [facility] POA / Guardian [place] ownership interest in [facility].provided to:: [facility] POA / Guardian    Expected Discharge Plan and Services       Living arrangements for the past 2 months: Single Family Home                                      Prior Living Arrangements/Services Living arrangements for the past 2 months: Single Family Home Lives with:: Relatives Patient language and need for interpreter reviewed:: No Do you feel safe going back to the place where you live?: Yes      Need for Family Participation in Patient Care: Yes (Comment) Care giver support system in place?: Yes (comment) Current home services: DME    Activities of Daily Living Home Assistive Devices/Equipment: CBG Meter, Hospital bed, Walker (specify type) ADL Screening (condition at time of admission) Patient's cognitive ability adequate  to safely complete daily activities?: No Is the patient deaf or have difficulty hearing?: No Does the patient have difficulty seeing, even when wearing glasses/contacts?: No Does the patient have difficulty concentrating, remembering, or making decisions?: Yes Patient able to express need for assistance with ADLs?: Yes Does the patient have difficulty dressing or bathing?: Yes Independently performs ADLs?: No Communication: Independent Dressing (OT): Needs assistance Is this a change from baseline?: Change from baseline, expected to last >3 days Grooming: Needs assistance Is this a change from baseline?: Change from baseline, expected to last >3 days Feeding: Independent Bathing: Needs assistance Is this a change from baseline?: Change from baseline, expected to last >3 days Toileting: Needs assistance Is this a change from baseline?: Change from baseline, expected to last >3days In/Out Bed: Needs assistance Is this a change from baseline?: Change from baseline, expected to last >3 days Walks in Home: Needs assistance Is this a change from baseline?: Change from baseline, expected to last <3 days Does the patient have difficulty walking or climbing stairs?: Yes Weakness of Legs: Both Weakness of Arms/Hands: Both  Permission Sought/Granted                  Emotional Assessment  Orientation: : Fluctuating Orientation (Suspected and/or reported Sundowners)      Admission diagnosis:  Weakness [R53.1] Generalized weakness [R53.1] Patient Active Problem List   Diagnosis Date Noted   Abdominal pain 08/30/2022   GERD without esophagitis 08/29/2022   Lewy body dementia with behavioral disturbance (HCC) 08/29/2022   CAP (community acquired pneumonia) 02/08/2022   Malnutrition of moderate degree 02/07/2022   Acute respiratory failure with hypoxia (HCC) 02/06/2022   Lewy body dementia (HCC) 02/06/2022   Upper respiratory tract infection due to influenza A virus 02/06/2022    Influenza 02/01/2022   Sepsis (HCC) 01/31/2022   Influenza A 01/31/2022   Dysphagia    Hiatal hernia without gangrene or obstruction    Ambulatory dysfunction 07/28/2021   Hypertensive urgency 07/28/2021   History of prostate cancer 07/28/2021   Parkinsonism 07/28/2021   Altered mental status 07/28/2021   Swelling of limb 12/26/2020   Bilateral primary osteoarthritis of knee 02/15/2020   Pressure injury of buttock, stage 1    Delirium    COVID-19 virus infection 10/08/2019   Unable to care for self 10/08/2019   Caregiver unable to cope 10/08/2019   Recurrent falls 10/08/2019   Lewy body dementia without behavioral disturbance (HCC)    Prostate cancer (HCC) 06/30/2019   BPH with urinary obstruction 11/06/2018   Acute cholecystitis 09/08/2018   Myalgia due to statin 02/24/2018   Tricompartment osteoarthritis of left knee 11/14/2017   Generalized weakness 04/28/2017   Chronic low back pain 03/12/2017   Knee pain, chronic 10/01/2016   Chronic venous insufficiency 08/30/2016   BPH without obstruction/lower urinary tract symptoms 03/06/2016   Osteoarthritis of multiple joints 03/06/2016   Degenerative joint disease (DJD) of lumbar spine 03/06/2016   Essential hypertension 03/05/2016   Hyperlipidemia 03/05/2016   Osteoporosis 03/05/2016   GERD (gastroesophageal reflux disease) 03/05/2016   Bilateral lower extremity edema 03/05/2016   Dupuytren's disease of palm 05/28/2015   PCP:  [name], DO Pharmacy:   [facility] - [place], [place] - [address] [place] [address] Phone: [phone] Fax: [phone]  [facility] - [place], [place] - [address] [place] [address] Phone: [phone] Fax: [phone]     Social Determinants of Health (SDOH) Social History: SDOH Screenings   Food Insecurity: Patient Unable To Answer (08/30/2022)  Housing: Patient Unable To Answer (08/30/2022)   Transportation Needs: Patient Unable To Answer (08/30/2022)  Utilities: Patient Unable To Answer (08/30/2022)  Alcohol Screen: Low Risk  (10/05/2021)  Depression (PHQ2-9): Low Risk  (10/05/2021)  Financial Resource Strain: Low Risk  (10/05/2021)  Physical Activity: Inactive (10/05/2021)  Social Connections: Moderately Isolated (10/05/2021)  Stress: No Stress Concern Present (10/05/2021)  Tobacco Use: Medium Risk (08/30/2022)  Health Literacy: Adequate Health Literacy (08/08/2022)   SDOH Interventions:     Readmission Risk Interventions     No data to display

## 2022-08-30 NOTE — Evaluation (Signed)
Physical Therapy Evaluation Patient Details Name: Randy [name] MRN: [id number] DOB: 01/19/1934 Today's Date: 08/30/2022  History of Present Illness  88 y/o male presented to ED on 08/29/22 for UTI and weakness. PMH: Lewy body dementia, Parkinson's, HTN  Clinical Impression  Patient admitted with the above. Per chart, patient requires assistance at home for stand pivot transfers to w/c as w/c is primary mobility. Patient presents with weakness, impaired balance, decreased activity tolerance, and impaired cognition. Patient with poor command following and resistive to movements with initiation by PT. Requires max-totalA for bed mobility and attempted stand but unable to come into standing from elevated surface. Patient will benefit from skilled PT services during acute stay to address listed deficits. Patient will benefit from ongoing therapy at discharge to maximize functional independence and safety.       If plan is discharge home, recommend the following: Two people to help with walking and/or transfers;Two people to help with bathing/dressing/bathroom;Assistance with feeding;Direct supervision/assist for medications management;Direct supervision/assist for financial management;Assist for transportation;Help with stairs or ramp for entrance   Can travel by private vehicle   No    Equipment Recommendations Other (comment) (defer to next venue of care)  Recommendations for Other Services       Functional Status Assessment Patient has had a recent decline in their functional status and/or demonstrates limited ability to make significant improvements in function in a reasonable and predictable amount of time     Precautions / Restrictions Precautions Precautions: Fall Restrictions Weight Bearing Restrictions: No      Mobility  Bed Mobility Overal bed mobility: Needs Assistance Bed Mobility: Supine to Sit, Sit to Supine     Supine to sit: Max assist Sit to supine: Total assist         Transfers Overall transfer level: Needs assistance Equipment used: Rolling Kammi Hechler (2 wheels) Transfers: Sit to/from Stand Sit to Stand: Total assist           General transfer comment: unable to come into standing from elevated bed surface    Ambulation/Gait                  Stairs            Wheelchair Mobility     Tilt Bed    Modified Rankin (Stroke Patients Only)       Balance Overall balance assessment: Needs assistance Sitting-balance support: Bilateral upper extremity supported, Feet supported Sitting balance-Leahy Scale: Poor                                       Pertinent Vitals/Pain Pain Assessment Pain Assessment: No/denies pain    Home Living Family/patient expects to be discharged to:: Private residence Living Arrangements: Children;Spouse/significant other Available Help at Discharge: Family;Available 24 hours/day Type of Home: House Home Access: Ramped entrance       Home Layout: One level Home Equipment: Rollator (4 wheels);Rolling Timmya Blazier (2 wheels);Grab bars - tub/shower;Toilet riser;Shower seat - built in;Hospital bed;Wheelchair - manual      Prior Function               Mobility Comments: Per chart from 01/2022, w/c level as primary mobility, completing SPT between seating surfaces with +1 assist. Hx of falls ADLs Comments: Per chart from 01/2022, extensive assist for transfer in/out of shower, with some assist for bathing routine..  Pt's wife and daughter provide assist for household  management, medications, transportation.     Extremity/Trunk Assessment   Upper Extremity Assessment Upper Extremity Assessment: Generalized weakness    Lower Extremity Assessment Lower Extremity Assessment: Generalized weakness    Cervical / Trunk Assessment Cervical / Trunk Assessment: Kyphotic  Communication   Communication Communication: Difficulty following commands/understanding;Difficulty communicating  thoughts/reduced clarity of speech  Cognition Arousal: Alert Behavior During Therapy: Anxious Overall Cognitive Status: History of cognitive impairments - at baseline                                 General Comments: poor command following        General Comments      Exercises     Assessment/Plan    PT Assessment Patient needs continued PT services  PT Problem List Decreased strength;Decreased activity tolerance;Decreased balance;Decreased mobility;Decreased cognition;Decreased coordination;Decreased knowledge of use of DME;Decreased safety awareness;Decreased knowledge of precautions;Cardiopulmonary status limiting activity       PT Treatment Interventions DME instruction;Therapeutic activities;Functional mobility training;Therapeutic exercise;Balance training;Patient/family education;Wheelchair mobility training    PT Goals (Current goals can be found in the Care Plan section)  Acute Rehab PT Goals Patient Stated Goal: unable to state PT Goal Formulation: Patient unable to participate in goal setting Time For Goal Achievement: 09/13/22 Potential to Achieve Goals: Poor    Frequency Min 1X/week     Co-evaluation               AM-PAC PT "6 Clicks" Mobility  Outcome Measure Help needed turning from your back to your side while in a flat bed without using bedrails?: Total Help needed moving from lying on your back to sitting on the side of a flat bed without using bedrails?: Total Help needed moving to and from a bed to a chair (including a wheelchair)?: Total Help needed standing up from a chair using your arms (e.g., wheelchair or bedside chair)?: Total Help needed to walk in hospital room?: Total Help needed climbing 3-5 steps with a railing? : Total 6 Click Score: 6    End of Session   Activity Tolerance: Other (comment) (limited by cognition) Patient left: in bed;with call bell/phone within reach;with bed alarm set Nurse Communication:  Mobility status PT Visit Diagnosis: Muscle weakness (generalized) (M62.81);History of falling (Z91.81);Other abnormalities of gait and mobility (R26.89);Unsteadiness on feet (R26.81)    Time: 4401-0272 PT Time Calculation (min) (ACUTE ONLY): 16 min   Charges:   PT Evaluation $PT Eval Moderate Complexity: 1 Mod   PT General Charges $$ ACUTE PT VISIT: 1 Visit         Maylon Peppers, PT, DPT Physical Therapist - [facility]  [facility]   Keyshaun Exley A Rayven Hendrickson 08/30/2022, 12:21 PM

## 2022-08-30 NOTE — Progress Notes (Signed)
PROGRESS NOTE    Randy [name]   GMW:[id number] DOB: 1934/05/06  DOA: 08/29/2022 Date of Service: 08/30/22 PCP: [name], DO     Brief Narrative / Hospital Course:  Randy [name] is a 88 y.o. Caucasian male with medical history significant for Lewy body dementia, hypertension dyslipidemia, GERD, osteoarthritis and BPH, who presented to the ER with acute onset of generalized weakness and suspected UTI.  The patient is able to ambulate at baseline and became significantly weak he could not stand or walk, per his daughter. Also concern for bloody/dark urine.  08/22: in ED VSS, labs and imaging unremarkable, infectious workup unremarkable. Admitted for weakness.  08/23: PT/OT recs SNF rehab, concern though w/ swallowing difficulty. Long discussion w/ daughter re: decline over past few weeks are concerning for end-stage dementia. Will consult for palliative care to assist w/ GOC and discuss hospice options   Consultants:  Palliative care   Procedures: None       ASSESSMENT & PLAN:   Principal Problem:   Generalized weakness Active Problems:   Hypertensive urgency   Parkinsonism   GERD without esophagitis   Lewy body dementia with behavioral disturbance (HCC)   Abdominal pain   Generalized weakness Suspect advancing / end-stage dementia + parkinsonism  Palliative care consult   Hypertensive urgency Resolved Monitor VS  Parkinsonism continue Exelon patch as well as Sinemet CR.  GERD without esophagitis PPI .  Lewy body dementia with behavioral disturbance (HCC) continue Exelon  as needed Zyprexa. continue Zoloft for associated depression.  Abdominal pain nausea and vomiting. Abdominal pelvic CT scan was unremarkable. Pain management prn    DVT prophylaxis: lovenox  Pertinent IV fluids/nutrition: stopped IV fluids, able to take po  Central lines / invasive devices: none  Code Status: DNR ACP documentation reviewed: none on file   Current  Admission Status: observation  TOC needs / Dispo plan: SNF vs hospice  Barriers to discharge / significant pending items: palliative care eval, not safe to go home at this point, working on possible SNF placement but would like evaluation from hospice              Subjective / Brief ROS:  Patient resting, calm, no concerns   Family Communication: spoke w/ daughter over the phone, see hospital course     Objective Findings:  Vitals:   08/30/22 1029 08/30/22 1030 08/30/22 1150 08/30/22 1223  BP: 129/80 129/80 (!) 156/89 (!) 165/86  Pulse:  72 80 69  Resp:  18 18 19   Temp:  98.4 F (36.9 C) 98.4 F (36.9 C) 98.2 F (36.8 C)  TempSrc:  Oral Oral Oral  SpO2:  94% 95% 97%  Weight:      Height:        Intake/Output Summary (Last 24 hours) at 08/30/2022 1521 Last data filed at 08/29/2022 2145 Gross per 24 hour  Intake 500 ml  Output --  Net 500 ml   Filed Weights   08/29/22 2106  Weight: 94.3 kg    Examination:  Physical Exam Constitutional:      General: He is not in acute distress.    Appearance: Normal appearance.  Cardiovascular:     Rate and Rhythm: Normal rate and regular rhythm.  Pulmonary:     Effort: Pulmonary effort is normal.     Breath sounds: Normal breath sounds.  Abdominal:     Palpations: Abdomen is soft.  Musculoskeletal:     Right lower leg: No edema.  Left lower leg: No edema.  Neurological:     Mental Status: He is disoriented.          Scheduled Medications:   ascorbic acid  500 mg Oral Daily   carbidopa-levodopa  1 tablet Oral QID   cholecalciferol  1,000 Units Oral Daily   enalapril  5 mg Oral Daily   enoxaparin (LOVENOX) injection  40 mg Subcutaneous Q24H   furosemide  20 mg Oral Q M,W,F   melatonin  5 mg Oral QHS   multivitamin with minerals  1 tablet Oral Daily   omega-3 acid ethyl esters  1 g Oral Daily   pantoprazole  40 mg Oral Daily   rivastigmine  3 mg Oral BID WC   sertraline  50 mg Oral Daily     Continuous Infusions:   PRN Medications:  acetaminophen **OR** acetaminophen, diclofenac Sodium, haloperidol lactate, hydrALAZINE, HYDROcodone-acetaminophen, labetalol, magnesium hydroxide, morphine injection, OLANZapine zydis, ondansetron **OR** ondansetron (ZOFRAN) IV, traZODone  Antimicrobials from admission:  Anti-infectives (From admission, onward)    None           Data Reviewed:  I have personally reviewed the following...  CBC: Recent Labs  Lab 08/29/22 1444 08/30/22 0525  WBC 7.0 6.8  HGB 12.7* 11.9*  HCT 38.9* 36.4*  MCV 92.6 91.2  PLT 212 190   Basic Metabolic Panel: Recent Labs  Lab 08/29/22 1444 08/30/22 0525  NA 135 139  K 3.9 3.5  CL 105 105  CO2 25 22  GLUCOSE 117* 99  BUN 20 19  CREATININE 0.96 0.78  CALCIUM 8.1* 8.3*   GFR: Estimated Creatinine Clearance: 79 mL/min (by C-G formula based on SCr of 0.78 mg/dL). Liver Function Tests: No results for input(s): "AST", "ALT", "ALKPHOS", "BILITOT", "PROT", "ALBUMIN" in the last 168 hours. No results for input(s): "LIPASE", "AMYLASE" in the last 168 hours. No results for input(s): "AMMONIA" in the last 168 hours. Coagulation Profile: No results for input(s): "INR", "PROTIME" in the last 168 hours. Cardiac Enzymes: No results for input(s): "CKTOTAL", "CKMB", "CKMBINDEX", "TROPONINI" in the last 168 hours. BNP (last 3 results) No results for input(s): "PROBNP" in the last 8760 hours. HbA1C: No results for input(s): "HGBA1C" in the last 72 hours. CBG: No results for input(s): "GLUCAP" in the last 168 hours. Lipid Profile: No results for input(s): "CHOL", "HDL", "LDLCALC", "TRIG", "CHOLHDL", "LDLDIRECT" in the last 72 hours. Thyroid Function Tests: No results for input(s): "TSH", "T4TOTAL", "FREET4", "T3FREE", "THYROIDAB" in the last 72 hours. Anemia Panel: No results for input(s): "VITAMINB12", "FOLATE", "FERRITIN", "TIBC", "IRON", "RETICCTPCT" in the last 72 hours. Most Recent  Urinalysis On File:     Component Value Date/Time   COLORURINE YELLOW (A) 08/30/2022 1440   APPEARANCEUR HAZY (A) 08/30/2022 1440   APPEARANCEUR Cloudy (A) 08/17/2020 1024   LABSPEC 1.017 08/30/2022 1440   PHURINE 7.0 08/30/2022 1440   GLUCOSEU NEGATIVE 08/30/2022 1440   HGBUR NEGATIVE 08/30/2022 1440   BILIRUBINUR NEGATIVE 08/30/2022 1440   BILIRUBINUR Negative 08/17/2020 1024   BILIRUBINUR negative 04/17/2015 0826   KETONESUR 5 (A) 08/30/2022 1440   PROTEINUR NEGATIVE 08/30/2022 1440   UROBILINOGEN 0.2 08/09/2020 1319   UROBILINOGEN Normal 04/17/2015 0826   NITRITE NEGATIVE 08/30/2022 1440   LEUKOCYTESUR NEGATIVE 08/30/2022 1440   LEUKOCYTESUR negative 04/17/2015 0826   Sepsis Labs: @LABRCNTIP (procalcitonin:4,lacticidven:4) Microbiology: Recent Results (from the past 240 hour(s))  SARS Coronavirus 2 by RT PCR (hospital order, performed in [facility] hospital lab) *cepheid single result test* Anterior Nasal Swab  Status: None   Collection Time: 08/29/22  8:17 PM   Specimen: Anterior Nasal Swab  Result Value Ref Range Status   SARS Coronavirus 2 by RT PCR NEGATIVE NEGATIVE Final    Comment: (NOTE) SARS-CoV-2 target nucleic acids are NOT DETECTED.  The SARS-CoV-2 RNA is generally detectable in upper and lower respiratory specimens during the acute phase of infection. The lowest concentration of SARS-CoV-2 viral copies this assay can detect is 250 copies / mL. A negative result does not preclude SARS-CoV-2 infection and should not be used as the sole basis for treatment or other patient management decisions.  A negative result may occur with improper specimen collection / handling, submission of specimen other than nasopharyngeal swab, presence of viral mutation(s) within the areas targeted by this assay, and inadequate number of viral copies (<250 copies / mL). A negative result must be combined with clinical observations, patient history, and epidemiological  information.  Fact Sheet for Patients:   RoadLapTop.co.za  Fact Sheet for Healthcare Providers: http://kim-miller.com/  This test is not yet approved or  cleared by the [place] FDA and has been authorized for detection and/or diagnosis of SARS-CoV-2 by FDA under an Emergency Use Authorization (EUA).  This EUA will remain in effect (meaning this test can be used) for the duration of the COVID-19 declaration under Section 564(b)(1) of the Act, 21 U.S.C. section 360bbb-3(b)(1), unless the authorization is terminated or revoked sooner.  Performed at [facility], [address] [place], [place] [address]   Culture, blood (Routine X 2) w Reflex to ID Panel     Status: None (Preliminary result)   Collection Time: 08/29/22 11:35 PM   Specimen: BLOOD  Result Value Ref Range Status   Specimen Description BLOOD BLOOD LEFT ARM  Final   Special Requests   Final    BOTTLES DRAWN AEROBIC AND ANAEROBIC Blood Culture adequate volume   Culture   Final    NO GROWTH < 12 HOURS Performed at [facility], [address] [place], [place] [address]    Report Status PENDING  Incomplete  Culture, blood (Routine X 2) w Reflex to ID Panel     Status: None (Preliminary result)   Collection Time: 08/30/22 12:06 AM   Specimen: BLOOD  Result Value Ref Range Status   Specimen Description BLOOD BLOOD LEFT HAND  Final   Special Requests   Final    BOTTLES DRAWN AEROBIC AND ANAEROBIC Blood Culture adequate volume   Culture   Final    NO GROWTH < 12 HOURS Performed at [facility], [address] [place], [place] [address]    Report Status PENDING  Incomplete      Radiology Studies last 3 days: CT ABDOMEN PELVIS W CONTRAST  Result Date: 08/29/2022 CLINICAL DATA:  Possible UTI abdomen pain EXAM: CT ABDOMEN AND PELVIS WITH CONTRAST TECHNIQUE: Multidetector CT imaging of the abdomen and pelvis was performed using the  standard protocol following bolus administration of intravenous contrast. RADIATION DOSE REDUCTION: This exam was performed according to the departmental dose-optimization program which includes automated exposure control, adjustment of the mA and/or kV according to patient size and/or use of iterative reconstruction technique. CONTRAST:  OMNIPAQUE IOHEXOL 300 MG/ML  SOLN COMPARISON:  CT 09/08/2018 FINDINGS: Lower chest: Lung bases demonstrate cardiomegaly. Small hiatal hernia. Trace right-sided pleural effusion. Hepatobiliary: Status post cholecystectomy.  No biliary dilatation. Pancreas: Unremarkable. No pancreatic ductal dilatation or surrounding inflammatory changes. Spleen: Normal in size without focal abnormality. Adrenals/Urinary Tract:  Adrenal glands are normal. Numerous simple and non simple cysts within the bilateral kidneys, overall increased compared to prior. Dominant slightly dense parapelvic lesion on the right measures up to 5.7 cm. Dominant simple cyst midpole left kidney measures 12.5 cm. Increased size of hyperdense cystic lesion mid to lower left kidney measuring 5.4 cm. Bladder diverticula. Stomach/Bowel: The stomach is nonenlarged. There is no acute bowel wall thickening. Negative appendix. Slight increased bowel gas throughout. Vascular/Lymphatic: Moderate aortic atherosclerosis. No aneurysm. No suspicious lymph nodes. Reproductive: Prostatectomy Other: Negative for pelvic effusion or free air. Rim calcified area between the bladder and rectum, possibly dystrophic and related to prior surgery. Musculoskeletal: No acute osseous abnormality. Severe degenerative changes of the left hip. Mild chronic osseous deformity of the left iliac bone with fusion of the left SI joint. Partial ankylosis across the right 1 aspects of the lower lumbar vertebral bodies. IMPRESSION: 1. No CT evidence for acute intra-abdominal or pelvic abnormality. 2. Numerous simple and non simple cysts within the  bilateral kidneys, overall increased compared to prior. When the patient is clinically stable and able to follow directions and hold their breath (preferably as an outpatient) further evaluation with dedicated abdominal MRI should be considered. Bladder diverticula 3. Trace right-sided pleural effusion. 4. Cardiomegaly. 5. Small hiatal hernia. 6. Status post cholecystectomy and prostatectomy. 7. Aortic atherosclerosis. Aortic Atherosclerosis (ICD10-I70.0). Electronically Signed   By: Jasmine Pang M.D.   On: 08/29/2022 23:33   DG Chest Port 1 View  Result Date: 08/29/2022 CLINICAL DATA:  [address] with cough. EXAM: PORTABLE CHEST 1 VIEW COMPARISON:  CTA chest 02/05/2022 FINDINGS: There are coarse interstitial changes in the upper lobes where the previous study demonstrated confluent ground-glass opacities. This is most consistent with post pneumonic interstitial scarring. Allowing patient obliquity to the left, no active lung infiltrate is suspected. There is no substantial pleural effusion. Mild-to-moderate cardiomegaly appears similar. No vascular congestion is seen. Stable mediastinum. There is aortic tortuosity, patchy calcification, and ectasia. Thoracic spondylosis and osteopenia. No new osseous findings. Chronic rotator cuff arthropathy. IMPRESSION: 1. No evidence of acute chest findings. Stable cardiomegaly and aortic atherosclerosis. 2. Chronic upper lobe interstitial scarring. No edema or substantial pleural effusion. 3. Chronic rotator cuff arthropathy. Electronically Signed   By: Almira Bar M.D.   On: 08/29/2022 20:38   CT Head Wo Contrast  Result Date: 08/29/2022 CLINICAL DATA:  Altered mental status EXAM: CT HEAD WITHOUT CONTRAST TECHNIQUE: Contiguous axial images were obtained from the base of the skull through the vertex without intravenous contrast. RADIATION DOSE REDUCTION: This exam was performed according to the departmental dose-optimization program which includes automated exposure  control, adjustment of the mA and/or kV according to patient size and/or use of iterative reconstruction technique. COMPARISON:  07/27/2021 FINDINGS: Evaluation is somewhat limited by motion artifact. Brain: No evidence of acute infarction, hemorrhage, mass, mass effect, or midline shift. No hydrocephalus or extra-axial fluid collection. Vascular: No hyperdense vessel. Skull: Negative for fracture or focal lesion. Sinuses/Orbits: Mucosal thickening and bubbly fluid in the ethmoid air cells. Status post bilateral lens replacements. Other: The mastoid air cells are well aerated. IMPRESSION: 1. No acute intracranial process. 2. Mucosal thickening and bubbly fluid in the ethmoid air cells, which can be seen in the setting of acute sinusitis. Correlate with symptoms. Electronically Signed   By: Wiliam Ke M.D.   On: 08/29/2022 20:27             LOS: 0 days    Time spent: 50 min  Sunnie Nielsen, DO [facility] 08/30/2022, 3:21 PM    Dictation software may have been used to generate the above note. Typos may occur and escape review in typed/dictated notes. Please contact Dr Lyn Hollingshead directly for clarity if needed.  Staff may message me via secure chat in Epic  but this may not receive an immediate response,  please page me for urgent matters!  If 7PM-7AM, please contact night coverage [email or web address]

## 2022-08-30 NOTE — Progress Notes (Signed)
                                                     Palliative Care Progress Note   Patient Name: Randy [name]       Date: 08/30/2022 DOB: 09-22-1934  Age: 88 y.o. MRN#: [id number] Attending Physician: Sunnie Nielsen, DO Primary Care Physician: [name], DO Admit Date: 08/29/2022  Referral received.  Chart reviewed.  I counseled with attending Dr. Lyn Hollingshead.  Family wishes to engage with hospice.  I notified TOC to offer choice of hospice agencies to further define hospice options.  No acute palliative needs at this time.  PMT will remain available to patient and family in follow-up where appropriate.  Thank you for allowing the Palliative Medicine Team to assist in the care of Randy [name].  Samara Deist L. Bonita Quin, DNP, FNP-BC Palliative Medicine Team    No charge

## 2022-08-30 NOTE — Assessment & Plan Note (Addendum)
-   This was associated with nausea and vomiting. - Abdominal pelvic CT scan was unremarkable. - Pain management will be provided.

## 2022-08-30 NOTE — ED Notes (Signed)
Taking over care of pt at this time

## 2022-08-30 NOTE — Evaluation (Signed)
Clinical/Bedside Swallow Evaluation Patient Details  Name: Randy [name] MRN: [id number] Date of Birth: 17-Sep-1934  Today's Date: 08/30/2022 Time: SLP Start Time (ACUTE ONLY): 1452 SLP Stop Time (ACUTE ONLY): 1505 SLP Time Calculation (min) (ACUTE ONLY): 13 min  Past Medical History:  Past Medical History:  Diagnosis Date   Arthritis    GERD (gastroesophageal reflux disease)    Hyperlipidemia    Hypertension    Lewy body dementia (HCC)    Osteoporosis    Prostate enlargement    Past Surgical History:  Past Surgical History:  Procedure Laterality Date   CHOLECYSTECTOMY N/A 09/10/2018   Procedure: LAPAROSCOPIC CHOLECYSTECTOMY;  Surgeon: Sung Amabile, DO;  Location: [facility] ORS;  Service: General;  Laterality: N/A;   CYSTOSCOPY WITH LITHOLAPAXY N/A 10/20/2020   Procedure: CYSTOSCOPY WITH LITHOLAPAXY;  Surgeon: Sondra Come, MD;  Location: [facility] ORS;  Service: Urology;  Laterality: N/A;   ESOPHAGOGASTRODUODENOSCOPY (EGD) WITH PROPOFOL N/A 10/11/2021   Procedure: ESOPHAGOGASTRODUODENOSCOPY (EGD) WITH PROPOFOL;  Surgeon: Toney Reil, MD;  Location: [facility] ENDOSCOPY;  Service: Gastroenterology;  Laterality: N/A;   GANGLION CYST EXCISION Left 2013   Left upper arm   HERNIA REPAIR N/A 2008   MIdline, ventral acquired hernia repair   HOLEP-LASER ENUCLEATION OF THE PROSTATE WITH MORCELLATION N/A 11/06/2018   Procedure: HOLEP-LASER ENUCLEATION OF THE PROSTATE WITH MORCELLATION;  Surgeon: Sondra Come, MD;  Location: [facility] ORS;  Service: Urology;  Laterality: N/A;   HOLEP-LASER ENUCLEATION OF THE PROSTATE WITH MORCELLATION N/A 10/20/2020   Procedure: HOLEP-LASER ENUCLEATION OF THE PROSTATE WITH MORCELLATION;  Surgeon: Sondra Come, MD;  Location: [facility] ORS;  Service: Urology;  Laterality: N/A;   HPI:  88 y/o male presented to ED on 08/29/22 for UTI and weakness. PMH: Lewy body dementia, Parkinson's, HTN    Assessment / Plan / Recommendation  Clinical Impression  Pt presents with  confused likely related to Lewy Body Dementia, Parkinson's Disease and recent UTI dx. Skilled observation was provided of pt consuming thin liquids via straw and soft solids. Pt consumed ~ 8 oz thin liquids via straw without overt s/s of aspiration with cough x 1 at end of consumption suspect d/t confusion and pushing cup away. Given current respiratory stability, would recommend thin liquids via straw with strict aspirations and downgrade to dysphagia 3 (as this was last prescribed diet). MD made aware of recommendations. ST intervention is not indicated at this time. SLP Visit Diagnosis: Dysphagia, unspecified (R13.10)    Aspiration Risk  Moderate aspiration risk    Diet Recommendation Dysphagia 3 (Mech soft);Thin liquid    Liquid Administration via: Cup;Straw Medication Administration: Whole meds with liquid Supervision: Full supervision/cueing for compensatory strategies;Staff to assist with self feeding Compensations: Minimize environmental distractions;Slow rate;Small sips/bites Postural Changes: Seated upright at 90 degrees;Remain upright for at least 30 minutes after po intake    Other  Recommendations Oral Care Recommendations: Oral care BID    Recommendations for follow up therapy are one component of a multi-disciplinary discharge planning process, led by the attending physician.  Recommendations may be updated based on patient status, additional functional criteria and insurance authorization.  Follow up Recommendations Follow physician's recommendations for discharge plan and follow up therapies      Assistance Recommended at Discharge  TBD  Functional Status Assessment Patient has not had a recent decline in their functional status (related to dysphagia)  Frequency and Duration   TBD         Prognosis Prognosis for improved oropharyngeal function: Guarded  Barriers to Reach Goals: Cognitive deficits      Swallow Study   General Date of Onset: 08/29/22 HPI: 88 y/o male  presented to ED on 08/29/22 for UTI and weakness. PMH: Lewy body dementia, Parkinson's, HTN Type of Study: Bedside Swallow Evaluation Previous Swallow Assessment: previous - 01/2022 Diet Prior to this Study: Regular;Thin liquids (Level 0) Temperature Spikes Noted: No Respiratory Status: Room air History of Recent Intubation: No Behavior/Cognition: Alert;Confused;Agitated;Uncooperative Oral Cavity Assessment: Within Functional Limits Oral Care Completed by SLP: No Oral Cavity - Dentition: Adequate natural dentition Self-Feeding Abilities: Total assist Patient Positioning: Upright in bed Baseline Vocal Quality: Normal Volitional Cough: Cognitively unable to elicit Volitional Swallow: Unable to elicit    Oral/Motor/Sensory Function Overall Oral Motor/Sensory Function:  (appears functional)   Ice Chips Ice chips: Not tested   Thin Liquid Thin Liquid: Within functional limits Presentation: Straw    Nectar Thick Nectar Thick Liquid: Not tested   Honey Thick Honey Thick Liquid: Not tested   Puree Puree: Within functional limits Presentation: Spoon   Solid     Solid: Within functional limits Other Comments: soft solids presented     Randy [name] B. Dreama Saa, M.S., CCC-SLP, Tree surgeon Certified Brain Injury Specialist [facility]  [facility] Office [phone] Ascom [phone] Fax [phone]

## 2022-08-30 NOTE — Patient Outreach (Signed)
  Care Management   Follow Up Note   08/30/2022 Name: Randy [name] MRN: [id number] DOB: 06/28/1934   Referred by: [name], DO Reason for referral : Care Management (RNCM: Follow up for Chronic Disease Management and Care Coordination Needs)   An unsuccessful telephone outreach was attempted today. The patient was referred to the case management team for assistance with care management and care coordination. The patient is actually inpatient at this time. Will monitor for discharge.   Follow Up Plan: A HIPPA compliant phone message was left for the patient providing contact information and requesting a return call.   Alto Denver RN, MSN, CCM RN Care Manager  [facility]  Ambulatory Care Management  Direct Number: [phone]

## 2022-08-31 DIAGNOSIS — R531 Weakness: Secondary | ICD-10-CM | POA: Diagnosis not present

## 2022-08-31 MED ORDER — LORAZEPAM 0.5 MG PO TABS: 0.5000 mg | ORAL_TABLET | Freq: Four times a day (QID) | ORAL | 0 refills | Status: DC | PRN

## 2022-08-31 MED ORDER — HALOPERIDOL 5 MG PO TABS: 5.0000 mg | ORAL_TABLET | Freq: Four times a day (QID) | ORAL | 0 refills | Status: DC | PRN

## 2022-08-31 MED ORDER — TRAZODONE HCL 50 MG PO TABS: 25.0000 mg | ORAL_TABLET | Freq: Every evening | ORAL | 0 refills | Status: DC | PRN

## 2022-08-31 NOTE — Progress Notes (Signed)
Patient is discharged to home with Hospice.  I called daughter and POA  [name] and went over, discharge instructions, follow up appointments, home meds and prescriptions with her and she verbalized understanding

## 2022-08-31 NOTE — Discharge Summary (Signed)
Physician Discharge Summary   Patient: Randy [name] MRN: [id number]  DOB: [birth year]   Admit:     Date of Admission: 08/29/2022 Admitted from: home   Discharge: Date of discharge: 08/31/22 Disposition: Hospice care at home Condition at discharge: poor  CODE STATUS: DNR     Discharge Physician: Sunnie Nielsen, DO [facility]     PCP: [name], DO  Recommendations for Outpatient Follow-up:  Follow up with PCP Althea Charon Netta Neat, DO in 1-2 weeks or with hospice provider        Discharge Diagnoses: Principal Problem:   Generalized weakness Active Problems:   Hypertensive urgency   Parkinsonism   GERD without esophagitis   Lewy body dementia with behavioral disturbance [facility])   Abdominal pain       Hospital Course: Randy [name] is a 88 y.o. Caucasian male with medical history significant for Lewy body dementia, hypertension dyslipidemia, GERD, osteoarthritis and BPH, who presented to the ER with acute onset of generalized weakness and suspected UTI.  The patient is able to ambulate at baseline and became significantly weak he could not stand or walk, per his daughter. Also concern for bloody/dark urine.  08/22: in ED VSS, labs and imaging unremarkable, infectious workup unremarkable. Admitted for weakness.  08/23: PT/OT recs SNF rehab, concern though w/ swallowing difficulty. Long discussion w/ daughter re: decline over past few weeks are concerning for end-stage dementia. Will consult for palliative care to assist w/ GOC and discuss hospice options  08/24: decision for home w/ hospice.   Consultants:  Palliative care   Procedures: None       ASSESSMENT & PLAN:    Generalized weakness Suspect advancing / end-stage dementia + parkinsonism  Symptomatic care   Hypertensive urgency Resolved Ok to continue antihypertensives at home   Parkinsonism continue Exelon patch as well as Sinemet CR.  GERD without  esophagitis PPI   Lewy body dementia with behavioral disturbance (HCC) continue Exelon  continue Zoloft for associated depression. Haldol po prn agitation Ativan po prn anxiety  Tzosone at bedtime prn insomnia   Abdominal pain nausea and vomiting. Abdominal pelvic CT scan was unremarkable. Pain management prn Anti-nausea medications prn              Discharge Instructions  Allergies as of 08/31/2022       Reactions   Antazoline Diarrhea   Antihistamines, Chlorpheniramine-type Other (See Comments)   Can't urinate        Medication List     STOP taking these medications    ascorbic acid 500 MG tablet Commonly known as: VITAMIN C   cholecalciferol 1000 units tablet Commonly known as: VITAMIN D   Co Q 10 100 MG Caps   Fish Oil 1200 MG Caps   multivitamin with minerals Tabs tablet       TAKE these medications    acetaminophen 500 MG tablet Commonly known as: TYLENOL Take 1,000 mg by mouth every 8 (eight) hours as needed.   carbidopa-levodopa 50-200 MG tablet Commonly known as: SINEMET CR Take 1 tablet by mouth 4 (four) times daily.   diclofenac Sodium 1 % Gel Commonly known as: VOLTAREN APPLY 2 G TOPICALLY 4 (FOUR) TIMES DAILY AS NEEDED. TO KNEE ARTHRITIS PAIN   enalapril 5 MG tablet Commonly known as: VASOTEC TAKE 1 TABLET (5 MG TOTAL) BY MOUTH DAILY.   furosemide 20 MG tablet Commonly known as: LASIX Take 1 tablet (20 mg total) by mouth every other day.  Patient takes Mondays, Wednesdays, and Fridays.   haloperidol 5 MG tablet Commonly known as: HALDOL Take 1 tablet (5 mg total) by mouth every 6 (six) hours as needed for agitation. May crush, mix with water and give sublingually if needed.   HYDROcodone-acetaminophen 5-325 MG tablet Commonly known as: NORCO/VICODIN Take 1 tablet by mouth every 6 (six) hours as needed for moderate pain.   LORazepam 0.5 MG tablet Commonly known as: ATIVAN Take 1 tablet (0.5 mg total) by mouth every 6  (six) hours as needed for anxiety. May crush, mix with water and give sublingually if needed.   melatonin 5 MG Tabs Take 1 tablet (5 mg total) by mouth at bedtime.   omeprazole 20 MG capsule Commonly known as: PRILOSEC Take 20 mg by mouth 2 (two) times daily.   ondansetron 4 MG disintegrating tablet Commonly known as: ZOFRAN-ODT Take 1 tablet (4 mg total) by mouth every 8 (eight) hours as needed for nausea or vomiting.   rivastigmine 3 MG capsule Commonly known as: EXELON Take 3 mg by mouth 2 (two) times daily.   sertraline 50 MG tablet Commonly known as: ZOLOFT Take 50 mg by mouth daily.   traZODone 50 MG tablet Commonly known as: DESYREL Take 0.5 tablets (25 mg total) by mouth at bedtime as needed for sleep.          Allergies  Allergen Reactions   Antazoline Diarrhea   Antihistamines, Chlorpheniramine-Type Other (See Comments)    Can't urinate     Subjective: pt unable to contribute   Discharge Exam: BP (!) 155/93   Pulse 78   Temp 98.4 F (36.9 C)   Resp 18   Ht 6' (1.829 m)   Wt 94.3 kg   SpO2 92%   BMI 28.20 kg/m  General: Pt is  not in acute distress Cardiovascular: RRR, S1/S2 +, no rubs, no gallops Respiratory: CTA bilaterally, no wheezing, no rhonchi Abdominal: Soft, NT, ND Extremities: no edema, no cyanosis     The results of significant diagnostics from this hospitalization (including imaging, microbiology, ancillary and laboratory) are listed below for reference.     Microbiology: Recent Results (from the past 240 hour(s))  SARS Coronavirus 2 by RT PCR (hospital order, performed in [facility] hospital lab) *cepheid single result test* Anterior Nasal Swab     Status: None   Collection Time: 08/29/22  8:17 PM   Specimen: Anterior Nasal Swab  Result Value Ref Range Status   SARS Coronavirus 2 by RT PCR NEGATIVE NEGATIVE Final    Comment: (NOTE) SARS-CoV-2 target nucleic acids are NOT DETECTED.  The SARS-CoV-2 RNA is generally  detectable in upper and lower respiratory specimens during the acute phase of infection. The lowest concentration of SARS-CoV-2 viral copies this assay can detect is 250 copies / mL. A negative result does not preclude SARS-CoV-2 infection and should not be used as the sole basis for treatment or other patient management decisions.  A negative result may occur with improper specimen collection / handling, submission of specimen other than nasopharyngeal swab, presence of viral mutation(s) within the areas targeted by this assay, and inadequate number of viral copies (<250 copies / mL). A negative result must be combined with clinical observations, patient history, and epidemiological information.  Fact Sheet for Patients:   RoadLapTop.co.za  Fact Sheet for Healthcare Providers: http://kim-miller.com/  This test is not yet approved or  cleared by the [place] FDA and has been authorized for detection and/or diagnosis of SARS-CoV-2 by  FDA under an Emergency Use Authorization (EUA).  This EUA will remain in effect (meaning this test can be used) for the duration of the COVID-19 declaration under Section 564(b)(1) of the Act, 21 U.S.C. section 360bbb-3(b)(1), unless the authorization is terminated or revoked sooner.  Performed at [facility], [address] [place], [place] [address]   Culture, blood (Routine X 2) w Reflex to ID Panel     Status: None (Preliminary result)   Collection Time: 08/29/22 11:35 PM   Specimen: BLOOD  Result Value Ref Range Status   Specimen Description BLOOD BLOOD LEFT ARM  Final   Special Requests   Final    BOTTLES DRAWN AEROBIC AND ANAEROBIC Blood Culture adequate volume   Culture   Final    NO GROWTH 1 DAY Performed at [facility], [address] [place], [place] [address]    Report Status PENDING  Incomplete  Culture, blood (Routine X 2) w Reflex to ID Panel     Status: None  (Preliminary result)   Collection Time: 08/30/22 12:06 AM   Specimen: BLOOD  Result Value Ref Range Status   Specimen Description BLOOD BLOOD LEFT HAND  Final   Special Requests   Final    BOTTLES DRAWN AEROBIC AND ANAEROBIC Blood Culture adequate volume   Culture   Final    NO GROWTH 1 DAY Performed at [facility], [address] [place], [place] [address]    Report Status PENDING  Incomplete     Labs: BNP (last 3 results) Recent Labs    02/05/22 1257  BNP 416.3*   Basic Metabolic Panel: Recent Labs  Lab 08/29/22 1444 08/30/22 0525  NA 135 139  K 3.9 3.5  CL 105 105  CO2 25 22  GLUCOSE 117* 99  BUN 20 19  CREATININE 0.96 0.78  CALCIUM 8.1* 8.3*   Liver Function Tests: No results for input(s): "AST", "ALT", "ALKPHOS", "BILITOT", "PROT", "ALBUMIN" in the last 168 hours. No results for input(s): "LIPASE", "AMYLASE" in the last 168 hours. No results for input(s): "AMMONIA" in the last 168 hours. CBC: Recent Labs  Lab 08/29/22 1444 08/30/22 0525  WBC 7.0 6.8  HGB 12.7* 11.9*  HCT 38.9* 36.4*  MCV 92.6 91.2  PLT 212 190   Cardiac Enzymes: No results for input(s): "CKTOTAL", "CKMB", "CKMBINDEX", "TROPONINI" in the last 168 hours. BNP: Invalid input(s): "POCBNP" CBG: No results for input(s): "GLUCAP" in the last 168 hours. D-Dimer No results for input(s): "DDIMER" in the last 72 hours. Hgb A1c No results for input(s): "HGBA1C" in the last 72 hours. Lipid Profile No results for input(s): "CHOL", "HDL", "LDLCALC", "TRIG", "CHOLHDL", "LDLDIRECT" in the last 72 hours. Thyroid function studies No results for input(s): "TSH", "T4TOTAL", "T3FREE", "THYROIDAB" in the last 72 hours.  Invalid input(s): "FREET3" Anemia work up No results for input(s): "VITAMINB12", "FOLATE", "FERRITIN", "TIBC", "IRON", "RETICCTPCT" in the last 72 hours. Urinalysis    Component Value Date/Time   COLORURINE YELLOW (A) 08/30/2022 1440   APPEARANCEUR HAZY (A) 08/30/2022  1440   APPEARANCEUR Cloudy (A) 08/17/2020 1024   LABSPEC 1.017 08/30/2022 1440   PHURINE 7.0 08/30/2022 1440   GLUCOSEU NEGATIVE 08/30/2022 1440   HGBUR NEGATIVE 08/30/2022 1440   BILIRUBINUR NEGATIVE 08/30/2022 1440   BILIRUBINUR Negative 08/17/2020 1024   BILIRUBINUR negative 04/17/2015 0826   KETONESUR 5 (A) 08/30/2022 1440   PROTEINUR NEGATIVE 08/30/2022 1440   UROBILINOGEN 0.2 08/09/2020 1319   UROBILINOGEN Normal 04/17/2015 0826   NITRITE NEGATIVE  08/30/2022 1440   LEUKOCYTESUR NEGATIVE 08/30/2022 1440   LEUKOCYTESUR negative 04/17/2015 0826   Sepsis Labs Recent Labs  Lab 08/29/22 1444 08/30/22 0525  WBC 7.0 6.8   Microbiology Recent Results (from the past 240 hour(s))  SARS Coronavirus 2 by RT PCR (hospital order, performed in [facility] hospital lab) *cepheid single result test* Anterior Nasal Swab     Status: None   Collection Time: 08/29/22  8:17 PM   Specimen: Anterior Nasal Swab  Result Value Ref Range Status   SARS Coronavirus 2 by RT PCR NEGATIVE NEGATIVE Final    Comment: (NOTE) SARS-CoV-2 target nucleic acids are NOT DETECTED.  The SARS-CoV-2 RNA is generally detectable in upper and lower respiratory specimens during the acute phase of infection. The lowest concentration of SARS-CoV-2 viral copies this assay can detect is 250 copies / mL. A negative result does not preclude SARS-CoV-2 infection and should not be used as the sole basis for treatment or other patient management decisions.  A negative result may occur with improper specimen collection / handling, submission of specimen other than nasopharyngeal swab, presence of viral mutation(s) within the areas targeted by this assay, and inadequate number of viral copies (<250 copies / mL). A negative result must be combined with clinical observations, patient history, and epidemiological information.  Fact Sheet for Patients:   RoadLapTop.co.za  Fact Sheet for Healthcare  Providers: http://kim-miller.com/  This test is not yet approved or  cleared by the [place] FDA and has been authorized for detection and/or diagnosis of SARS-CoV-2 by FDA under an Emergency Use Authorization (EUA).  This EUA will remain in effect (meaning this test can be used) for the duration of the COVID-19 declaration under Section 564(b)(1) of the Act, 21 U.S.C. section 360bbb-3(b)(1), unless the authorization is terminated or revoked sooner.  Performed at [facility], [address] [place], [place] [address]   Culture, blood (Routine X 2) w Reflex to ID Panel     Status: None (Preliminary result)   Collection Time: 08/29/22 11:35 PM   Specimen: BLOOD  Result Value Ref Range Status   Specimen Description BLOOD BLOOD LEFT ARM  Final   Special Requests   Final    BOTTLES DRAWN AEROBIC AND ANAEROBIC Blood Culture adequate volume   Culture   Final    NO GROWTH 1 DAY Performed at [facility], [address], [place], [place] [address]    Report Status PENDING  Incomplete  Culture, blood (Routine X 2) w Reflex to ID Panel     Status: None (Preliminary result)   Collection Time: 08/30/22 12:06 AM   Specimen: BLOOD  Result Value Ref Range Status   Specimen Description BLOOD BLOOD LEFT HAND  Final   Special Requests   Final    BOTTLES DRAWN AEROBIC AND ANAEROBIC Blood Culture adequate volume   Culture   Final    NO GROWTH 1 DAY Performed at [facility], [address] [place], [place] [address]    Report Status PENDING  Incomplete   Imaging CT ABDOMEN PELVIS W CONTRAST  Result Date: 08/29/2022 CLINICAL DATA:  Possible UTI abdomen pain EXAM: CT ABDOMEN AND PELVIS WITH CONTRAST TECHNIQUE: Multidetector CT imaging of the abdomen and pelvis was performed using the standard protocol following bolus administration of intravenous contrast. RADIATION DOSE REDUCTION: This exam was performed according to the departmental  dose-optimization program which includes automated exposure control, adjustment of the mA and/or kV according to patient size and/or use of  iterative reconstruction technique. CONTRAST:  OMNIPAQUE IOHEXOL 300 MG/ML  SOLN COMPARISON:  CT 09/08/2018 FINDINGS: Lower chest: Lung bases demonstrate cardiomegaly. Small hiatal hernia. Trace right-sided pleural effusion. Hepatobiliary: Status post cholecystectomy.  No biliary dilatation. Pancreas: Unremarkable. No pancreatic ductal dilatation or surrounding inflammatory changes. Spleen: Normal in size without focal abnormality. Adrenals/Urinary Tract: Adrenal glands are normal. Numerous simple and non simple cysts within the bilateral kidneys, overall increased compared to prior. Dominant slightly dense parapelvic lesion on the right measures up to 5.7 cm. Dominant simple cyst midpole left kidney measures 12.5 cm. Increased size of hyperdense cystic lesion mid to lower left kidney measuring 5.4 cm. Bladder diverticula. Stomach/Bowel: The stomach is nonenlarged. There is no acute bowel wall thickening. Negative appendix. Slight increased bowel gas throughout. Vascular/Lymphatic: Moderate aortic atherosclerosis. No aneurysm. No suspicious lymph nodes. Reproductive: Prostatectomy Other: Negative for pelvic effusion or free air. Rim calcified area between the bladder and rectum, possibly dystrophic and related to prior surgery. Musculoskeletal: No acute osseous abnormality. Severe degenerative changes of the left hip. Mild chronic osseous deformity of the left iliac bone with fusion of the left SI joint. Partial ankylosis across the right 1 aspects of the lower lumbar vertebral bodies. IMPRESSION: 1. No CT evidence for acute intra-abdominal or pelvic abnormality. 2. Numerous simple and non simple cysts within the bilateral kidneys, overall increased compared to prior. When the patient is clinically stable and able to follow directions and hold their breath (preferably as  an outpatient) further evaluation with dedicated abdominal MRI should be considered. Bladder diverticula 3. Trace right-sided pleural effusion. 4. Cardiomegaly. 5. Small hiatal hernia. 6. Status post cholecystectomy and prostatectomy. 7. Aortic atherosclerosis. Aortic Atherosclerosis (ICD10-I70.0). Electronically Signed   By: Jasmine Pang M.D.   On: 08/29/2022 23:33   DG Chest Port 1 View  Result Date: 08/29/2022 CLINICAL DATA:  [address] with cough. EXAM: PORTABLE CHEST 1 VIEW COMPARISON:  CTA chest 02/05/2022 FINDINGS: There are coarse interstitial changes in the upper lobes where the previous study demonstrated confluent ground-glass opacities. This is most consistent with post pneumonic interstitial scarring. Allowing patient obliquity to the left, no active lung infiltrate is suspected. There is no substantial pleural effusion. Mild-to-moderate cardiomegaly appears similar. No vascular congestion is seen. Stable mediastinum. There is aortic tortuosity, patchy calcification, and ectasia. Thoracic spondylosis and osteopenia. No new osseous findings. Chronic rotator cuff arthropathy. IMPRESSION: 1. No evidence of acute chest findings. Stable cardiomegaly and aortic atherosclerosis. 2. Chronic upper lobe interstitial scarring. No edema or substantial pleural effusion. 3. Chronic rotator cuff arthropathy. Electronically Signed   By: Almira Bar M.D.   On: 08/29/2022 20:38   CT Head Wo Contrast  Result Date: 08/29/2022 CLINICAL DATA:  Altered mental status EXAM: CT HEAD WITHOUT CONTRAST TECHNIQUE: Contiguous axial images were obtained from the base of the skull through the vertex without intravenous contrast. RADIATION DOSE REDUCTION: This exam was performed according to the departmental dose-optimization program which includes automated exposure control, adjustment of the mA and/or kV according to patient size and/or use of iterative reconstruction technique. COMPARISON:  07/27/2021 FINDINGS: Evaluation is  somewhat limited by motion artifact. Brain: No evidence of acute infarction, hemorrhage, mass, mass effect, or midline shift. No hydrocephalus or extra-axial fluid collection. Vascular: No hyperdense vessel. Skull: Negative for fracture or focal lesion. Sinuses/Orbits: Mucosal thickening and bubbly fluid in the ethmoid air cells. Status post bilateral lens replacements. Other: The mastoid air cells are well aerated. IMPRESSION: 1. No acute intracranial process. 2. Mucosal thickening and  bubbly fluid in the ethmoid air cells, which can be seen in the setting of acute sinusitis. Correlate with symptoms. Electronically Signed   By: Wiliam Ke M.D.   On: 08/29/2022 20:27      Time coordinating discharge: over 30 minutes  SIGNED:  Sunnie Nielsen DO [facility]

## 2022-08-31 NOTE — TOC Progression Note (Signed)
Transition of Care [facility]) - Progression Note    Patient Details  Name: Randy [name] MRN: [id number] Date of Birth: [birth year]  [facility]) CM/SW Contact  Allena Katz, LCSW Phone Number: 08/31/2022, 9:26 AM  Clinical Narrative:   CSW spoke with patients daughter who reports that she wants to go home with hospice. She has chosen to use [employer]. Daughter requested resources for additional assistance at home if needed. This was added to the AVS. Ree Kida with [facility] notified.     Expected Discharge Plan: Home w Home Health Services    Expected Discharge Plan and Services       Living arrangements for the past 2 months: Single Family Home                                       Social Determinants of Health (SDOH) Interventions SDOH Screenings   Food Insecurity: Patient Unable To Answer (08/30/2022)  Housing: Patient Unable To Answer (08/30/2022)  Transportation Needs: Patient Unable To Answer (08/30/2022)  Utilities: Patient Unable To Answer (08/30/2022)  Alcohol Screen: Low Risk  (10/05/2021)  Depression (PHQ2-9): Low Risk  (10/05/2021)  Financial Resource Strain: Low Risk  (10/05/2021)  Physical Activity: Inactive (10/05/2021)  Social Connections: Moderately Isolated (10/05/2021)  Stress: No Stress Concern Present (10/05/2021)  Tobacco Use: Medium Risk (08/30/2022)  Health Literacy: Adequate Health Literacy (08/08/2022)    Readmission Risk Interventions     No data to display

## 2022-08-31 NOTE — TOC Transition Note (Signed)
Transition of Care [facility]) - CM/SW Discharge Note   Patient Details  Name: Randy [name] MRN: [id number] Date of Birth: Apr 22, 1934  Transition [facility]) CM/SW Contact:  Allena Katz, LCSW Phone Number: 08/31/2022, 2:29 PM   Clinical Narrative:  Pt discharging home with [facility] home hospice. [facility] called pt is second on the list, medical necessity printed to the unit. DNR signed and on the chart.    Final next level of care: Home w Home Health Services     Patient Goals and CMS Choice   Choice offered to / list presented to : [facility] POA / Guardian  Discharge Placement                         Discharge Plan and Services Additional resources added to the After Visit Summary for                                       Social Determinants of Health (SDOH) Interventions SDOH Screenings   Food Insecurity: Patient Unable To Answer (08/30/2022)  Housing: Patient Unable To Answer (08/30/2022)  Transportation Needs: Patient Unable To Answer (08/30/2022)  Utilities: Patient Unable To Answer (08/30/2022)  Alcohol Screen: Low Risk  (10/05/2021)  Depression (PHQ2-9): Low Risk  (10/05/2021)  Financial Resource Strain: Low Risk  (10/05/2021)  Physical Activity: Inactive (10/05/2021)  Social Connections: Moderately Isolated (10/05/2021)  Stress: No Stress Concern Present (10/05/2021)  Tobacco Use: Medium Risk (08/30/2022)  Health Literacy: Adequate Health Literacy (08/08/2022)     Readmission Risk Interventions     No data to display

## 2022-08-31 NOTE — Progress Notes (Signed)
Pt awaiting EMS transport home. Called dispatch for update. Pt next in line when vehicle is available.

## 2022-08-31 NOTE — Discharge Instructions (Signed)
[facility]   Randy [name]  [phone]   [facility] [employer], [facility] does NOT require a minimum amount of hours or a contract.      Care patrol [phone]   Can assist with aide care and placement.

## 2022-08-31 NOTE — Progress Notes (Addendum)
[employer], contracting Hospice Referral Liaison Note  Received request from Allena Katz, SW,Transitions of Care Manager, for hospice services at home after discharge. Spoke with [name] (POA), daughter and Drenda Freeze (Son in [place]) primary caregiver,  to initiate education related to hospice philosophy, services, and team approach to care. Patient/family verbalized understanding of information given. Per discussion, the plan is for discharge home by EMS when the hospital is ready for discharge.  No DME needs that would hold up hospital discharge.  DME needs discussed. Patient has the following equipment in the home:  hospital bed, wheel chair, 2 wheeled walker, rollator, BSC, lift chair.  (Though [employer])  Family okay with changing out.  Patient/family requests the following equipment for delivery: DME needs:  AV RN to assess at admission visit.  Requesting hoyer lift and over bed table.  Supplies Needed:  XL diapers, chux and Large Latex free gloves  The address has been verified and is correct in the chart. Contact patient's daughter, [name] for AV visit [phone]  Please send signed and completed DNR home with patient/family if applicable. Please provide prescriptions at discharge as needed to ensure ongoing symptom management.  Patient will need meds for agitation/restlessness.  [facility] information and contact numbers given to [employer]. Above information shared with Allena Katz, SW Transitions of Care Manager and care team at [facility].  Please call with any hospice related questions or concerns. Thank you for the opportunity to participate in this patient's care.  Norris Cross, RN Nurse Liaison [phone]

## 2022-09-02 ENCOUNTER — Telehealth: Payer: Self-pay

## 2022-09-02 NOTE — Telephone Encounter (Signed)
Called and gave verbal to proceed w/ hospice 6 month terminal condition or less, and I will serve as attending of record  Saralyn Pilar, DO [facility] 09/02/2022, 1:59 PM

## 2022-09-02 NOTE — Telephone Encounter (Signed)
Copied from CRM [id number]. Topic: General - Other >> Sep 02, 2022 11:58 AM [place] B wrote: Reason for CRM: Archie Patten from [place], she is requesting referral from Dr Kirtland Bouchard of verbal certificate for terminal illness after 6 months for pt and is asking if Dr Kirtland Bouchard is willing to follow pt, signing orders etc. She also states pt has appt today at 2 and is asking for a response back from this within 48 hrs

## 2022-09-04 ENCOUNTER — Ambulatory Visit: Payer: Medicare Other | Admitting: Family Medicine

## 2022-09-04 LAB — CULTURE, BLOOD (ROUTINE X 2)
Culture: NO GROWTH
Culture: NO GROWTH
Special Requests: ADEQUATE
Special Requests: ADEQUATE

## 2022-09-10 ENCOUNTER — Other Ambulatory Visit: Payer: Self-pay

## 2022-09-10 DIAGNOSIS — R600 Localized edema: Secondary | ICD-10-CM

## 2022-09-10 MED ORDER — FUROSEMIDE 20 MG PO TABS
20.0000 mg | ORAL_TABLET | ORAL | 2 refills | Status: DC
Start: 2022-09-10 — End: 2023-04-29

## 2022-09-10 NOTE — Telephone Encounter (Signed)
Requested Prescriptions   Signed Prescriptions Disp Refills   furosemide (LASIX) 20 MG tablet 45 tablet 2    Sig: Take 1 tablet (20 mg total) by mouth every other day. Patient takes Mondays, Wednesdays, and Fridays.    Authorizing Provider: Debbe Odea    Ordering User: Guerry Minors

## 2022-09-13 ENCOUNTER — Telehealth: Payer: Self-pay | Admitting: Family Medicine

## 2022-09-13 DIAGNOSIS — M159 Polyosteoarthritis, unspecified: Secondary | ICD-10-CM

## 2022-09-13 DIAGNOSIS — G8929 Other chronic pain: Secondary | ICD-10-CM

## 2022-09-13 DIAGNOSIS — Z515 Encounter for palliative care: Secondary | ICD-10-CM

## 2022-09-13 NOTE — Telephone Encounter (Signed)
Medication Refill - Medication: HYDROcodone-acetaminophen (NORCO/VICODIN) 5-325 MG tablet   Has the patient contacted their pharmacy? No.   Preferred Pharmacy (with phone number or street name):  [facility] - [place], [place] - [address] Phone: [phone]  Fax: [phone]      Has the patient been seen for an appointment in the last year OR does the patient have an upcoming appointment? Yes.    Agent: Please be advised that RX refills may take up to 3 business days. We ask that you follow-up with your pharmacy.   Randy [name] is going to talk with the daughter because the patient has been getting his pain meds every 8 hours but I let her know the prescription states every 6 hours and she is going to relay that. She states he has been in quite a bit of pain especially in his knees. If the prescription changes or the provider recommends something stronger please let them know. Please assist patient further

## 2022-09-16 MED ORDER — HYDROCODONE-ACETAMINOPHEN 7.5-325 MG PO TABS
1.0000 | ORAL_TABLET | Freq: Four times a day (QID) | ORAL | 0 refills | Status: DC | PRN
Start: 2022-09-16 — End: 2022-10-30

## 2022-09-16 NOTE — Telephone Encounter (Signed)
Attempted to call [facility] could not reach them. Left voice mail for [place].   Spoke with family and agreed to dose increase Hydrocodone-acetaminophen from 5/325 up to 7.5/325  He is taking 4 times a day every 6 hours as needed, still in pain at current dose.  She questions last refilled only 60 tablets, I am not sure the reason why. I suspect it could be insurance coverage # of days / pills. But she should talk to pharmacy next  New order 120 pills 7.5mg   Saralyn Pilar, DO [facility] 09/16/2022, 5:44 PM

## 2022-09-19 ENCOUNTER — Other Ambulatory Visit: Payer: Medicare Other

## 2022-09-19 ENCOUNTER — Other Ambulatory Visit: Payer: Self-pay

## 2022-09-19 NOTE — Patient Outreach (Signed)
Care Management   Visit Note  09/19/2022 Name: Randy [name] MRN: [id number] DOB: 1934-09-16  Subjective: Randy [name] is a 88 y.o. year old male who is a primary care patient of [name], DO. The Care Management team was consulted for assistance.      Engaged with patient spoke with the family member (POA, [place], [place]).    Goals Addressed             This Visit's Progress    COMPLETED: RNCM Care Management Expected Outcome:  Monitor, Self-Manage and Reduce Symptoms of  Lewy Body Dementia       Current Barriers: The patient is now enrolled with Hospice and Care Management services has been completed. Goal being closed Knowledge Deficits related to the progression of Lewy body dementia and impact it has on health and well being and impact on relationships of caregivers that work with the patient Care Coordination needs related to caregiver needs and special needs of the patient in a patient with advanced Lewy Body Dementia Chronic Disease Management support and education needs related to effective management of Lewy body dementia  Cognitive Deficits  Planned Interventions: Evaluation of current treatment plan related to management of Lewy body dementia and patient's adherence to plan as established by provider.The patients daughter states the patient continues to decline and they are just taking it day by day. She says things the last several days have been "horrible". The patient is having more hallucinations and he is not eating. He has been staying in the bed. The patients daughter states that she is tired and does not know what to do. She cannot move him but her son in law is there. The patient is out of pain medications. She had meant to call the office on Monday but she did yesterday. In basket message sent to the pcp asking for assistance with getting refill on the pain medications. Will continue to monitor for changes.  Reflective listening and support. Advised  patient to call the office for changes in mood, anxiety, depression, or decreased memory loss Provided education to patient re: changes that occur over the course of time with Lewy body dementia and to monitor for deficits and decline in the patients ability to perform ADLS. The patient is steadily declining. The patient daughter states he is more agitated and "fights back" when he is being touched. Have let the pcp know. Will continue to monitor.  Reviewed medications with patient and discussed compliance. Works with pharm D for effective management of medications. The family and the patients daughter manage his medications. He is taking as directed.   Provided patient with fall and safety information educational materials related to preventing falls and safety precautions in patient with Lewy body dementia. Denies any new falls. States sometimes he may almost miss the chair when transitioning but has not had any falls.  Reviewed scheduled/upcoming provider appointments including No upcoming appointments with the pcp. Knows to call for changes or new needs.  Discussed plans with patient for ongoing care management follow up and provided patient with direct contact information for care management team Advised patient to discuss new onset of sx and sx of cold and changes in the patient mobility and memory with provider Screening for signs and symptoms of depression related to chronic disease state  Assessed social determinant of health barriers  Symptom Management: Take medications as prescribed   Attend all scheduled provider appointments Call provider office for new concerns or questions  call  the Suicide and Crisis Lifeline: 988 call the [place] National Suicide Prevention Lifeline: 646-347-7790 or TTY: 725-497-2174 TTY 315-050-8844) to talk to a trained counselor call 1-800-273-TALK (toll free, 24 hour hotline) if experiencing a Mental Health or Behavioral Health Crisis   Follow Up Plan:  Telephone follow up appointment with care management team member scheduled for: 08-30-2022 at 0900 am       COMPLETED: RNCM Care Management Expected Outcome:  Monitor, Self-Manage and Reduce Symptoms of  Osteoarthritis joint pain       Current Barriers: The patient is now enrolled with Hospice and Care Management services has been completed. Goal being closed Knowledge Deficits related to how to effectively manage pain and prevent falls with injuries in patient with OA Care Coordination needs related to evaluation of pain in a patient with OA with advanced Lewy body dementia Chronic Disease Management support and education needs related to effective management of OA with patient with memory deficits  Cognitive Deficits  Planned Interventions: Reviewed provider established plan for pain management. The patients daughter states that his pain is out of control. She is also out of pain medications. Refill request sent to the pcp for review. She also says the patient is not sleeping. The patient is having more hallucinations. Recommendations given. Will continue to monitor and follow up accordingly with the patients daughter.  Discussed importance of adherence to all scheduled medical appointments. The patient has had drastic changes over the last several days. Education provided. Have consulted with the pcp for recommendations. Counseled on the importance of reporting any/all new or changed pain symptoms or management strategies to pain management provider. Education and review given; Advised patient to report to care team affect of pain on daily activities; Discussed use of relaxation techniques and/or diversional activities to assist with pain reduction (distraction, imagery, relaxation, massage, acupressure, TENS, heat, and cold application. Did discuss trying heat application to see if this would help the patient. The daughter states they have not tried this.  Reviewed with patient prescribed  pharmacological and nonpharmacological pain relief strategies. The patient has paid caregiver during the day and he tries to work with him on exercises that the PT did with the patient when they were working with him. The patient lots of times will not do this due to the pain level and increased pain level; Advised patient to discuss unresolved pain, changes in level or intensity of pain, and safety and falls prevention with provider; Review of measures to keep the patient safe and preventing falls. The patient is at high risk for falls due to impaired mobility and history or falls. Has worked with PT in the past. The daughter denies any new falls with the patient. The patient is bed bound and has not been getting up. Education provided.  Call made back to the patients daughter today and let her know that the pcp has refilled the patients pain medications and they should be available at the pharmacy. Also discussed palliative services and Hospice. Silvio Pate says they are not ready for that yet and it has been awhile since Florentina Addison has been out with palliative. Recommended her call Florentina Addison and see about getting her to come out to the home for evaluation and support. Will continue to monitor for changes or new needs related to palliative services vs Hospice. The daughter knows how to reach the [facility] if needed.   Symptom Management: Take medications as prescribed   Attend all scheduled provider appointments Call pharmacy for medication refills 3-7 days  in advance of running out of medications call the Suicide and Crisis Lifeline: 988 call the [place] National Suicide Prevention Lifeline: 239-560-6118 or TTY: (740)409-7560 TTY 856-778-1377) to talk to a trained counselor call 1-800-273-TALK (toll free, 24 hour hotline) if experiencing a Mental Health or Behavioral Health Crisis   Follow Up Plan: Telephone follow up appointment with care management team member scheduled for: 08-30-2022 at 0900 am       COMPLETED:  RNCM Care Management: Expected Outcome:  Monitor, Self-Manage, and Reduce Symptoms of Hypertension       Current Barriers: The patient is now enrolled with Hospice and Care Management services has been completed. Goal being closed Knowledge Deficits related to importance of normalized blood pressures in the patient with lewy body dementia and HTN Care Coordination needs related to taking blood pressures on a regular basis and calling the office for changes in blood pressures in a patient with HTN and Lewy body dementia Chronic Disease Management support and education needs related to effective management of HTN Cognitive Deficits  BP Readings from Last 3 Encounters:  07/17/22 (!) 179/89  06/21/22 138/64  03/04/22 122/86     Planned Interventions: Evaluation of current treatment plan related to hypertension self management and patient's adherence to plan as established by provider. Blood pressures are elevated more and likely due to chronic pain and discomfort and agitation. The patient is requiring 24/7 care. The patients daughter states that they are just taking things day by day. She said he has had a significant decline over the last several days.  Provided education to patient re: stroke prevention, s/s of heart attack and stroke; Reviewed prescribed diet heart healthy. The patient has not been eating. The patients grandson in law is with him during the day and helps with meeting his needs.  Reviewed medications with patient and discussed importance of compliance. States compliance with medications;  Discussed plans with patient for ongoing care management follow up and provided patient with direct contact information for care management team; Advised patient, providing education and rationale, to monitor blood pressure daily and record, calling PCP for findings outside established parameters;  Reviewed scheduled/upcoming provider appointments including:  Sees pcp on a regular basis. Knows  to call for changes.  Advised patient to discuss changes in blood pressures or heart health with provider; Provided education on prescribed diet heart healthy;  Discussed complications of poorly controlled blood pressure such as heart disease, stroke, circulatory complications, vision complications, kidney impairment, sexual dysfunction;  Screening for signs and symptoms of depression related to chronic disease state;  Assessed social determinant of health barriers;   Symptom Management: Take medications as prescribed   Attend all scheduled provider appointments Call provider office for new concerns or questions  call the Suicide and Crisis Lifeline: 988 call the [place] National Suicide Prevention Lifeline: 205-105-7302 or TTY: 4067876640 TTY (580)724-5309) to talk to a trained counselor call 1-800-273-TALK (toll free, 24 hour hotline) if experiencing a Mental Health or Behavioral Health Crisis  check blood pressure 3 times per week write blood pressure results in a log or diary learn about high blood pressure keep a blood pressure log call doctor for signs and symptoms of high blood pressure develop an action plan for high blood pressure keep all doctor appointments take medications for blood pressure exactly as prescribed report new symptoms to your doctor eat more whole grains, fruits and vegetables, lean meats and healthy fats  Follow Up Plan: Telephone follow up appointment with care management team member  scheduled for: 08-30-2022  at 0900 am           Consent to Services:  The patient is now with Hospice Services. Spoke with the patients daughter today to confirm. Care Management program being closed.   Plan: No further follow up required: the patient is now enrolled in Hospice Services   Alto Denver RN, MSN, CCM RN Care Manager  [facility]  Ambulatory Care Management  Direct Number: [phone]

## 2022-09-19 NOTE — Patient Instructions (Signed)
Visit Information  Thank you for taking time to visit with me today. Please don't hesitate to contact me if I can be of assistance to you before our next scheduled telephone appointment.  Following are the goals we discussed today:   Goals Addressed             This Visit's Progress    COMPLETED: RNCM Care Management Expected Outcome:  Monitor, Self-Manage and Reduce Symptoms of  Lewy Body Dementia       Current Barriers: The patient is now enrolled with Hospice and Care Management services has been completed. Goal being closed Knowledge Deficits related to the progression of Lewy body dementia and impact it has on health and well being and impact on relationships of caregivers that work with the patient Care Coordination needs related to caregiver needs and special needs of the patient in a patient with advanced Lewy Body Dementia Chronic Disease Management support and education needs related to effective management of Lewy body dementia  Cognitive Deficits  Planned Interventions: Evaluation of current treatment plan related to management of Lewy body dementia and patient's adherence to plan as established by provider.The patients daughter states the patient continues to decline and they are just taking it day by day. She says things the last several days have been "horrible". The patient is having more hallucinations and he is not eating. He has been staying in the bed. The patients daughter states that she is tired and does not know what to do. She cannot move him but her son in law is there. The patient is out of pain medications. She had meant to call the office on Monday but she did yesterday. In basket message sent to the pcp asking for assistance with getting refill on the pain medications. Will continue to monitor for changes.  Reflective listening and support. Advised patient to call the office for changes in mood, anxiety, depression, or decreased memory loss Provided education to  patient re: changes that occur over the course of time with Lewy body dementia and to monitor for deficits and decline in the patients ability to perform ADLS. The patient is steadily declining. The patient daughter states he is more agitated and "fights back" when he is being touched. Have let the pcp know. Will continue to monitor.  Reviewed medications with patient and discussed compliance. Works with pharm D for effective management of medications. The family and the patients daughter manage his medications. He is taking as directed.   Provided patient with fall and safety information educational materials related to preventing falls and safety precautions in patient with Lewy body dementia. Denies any new falls. States sometimes he may almost miss the chair when transitioning but has not had any falls.  Reviewed scheduled/upcoming provider appointments including No upcoming appointments with the pcp. Knows to call for changes or new needs.  Discussed plans with patient for ongoing care management follow up and provided patient with direct contact information for care management team Advised patient to discuss new onset of sx and sx of cold and changes in the patient mobility and memory with provider Screening for signs and symptoms of depression related to chronic disease state  Assessed social determinant of health barriers  Symptom Management: Take medications as prescribed   Attend all scheduled provider appointments Call provider office for new concerns or questions  call the Suicide and Crisis Lifeline: 988 call the [place] National Suicide Prevention Lifeline: 226-009-9470 or TTY: (947) 047-2081 TTY 213-470-7873) to talk to a  trained counselor call 1-800-273-TALK (toll free, 24 hour hotline) if experiencing a Mental Health or Behavioral Health Crisis   Follow Up Plan: Telephone follow up appointment with care management team member scheduled for: 08-30-2022 at 0900 am       COMPLETED:  RNCM Care Management Expected Outcome:  Monitor, Self-Manage and Reduce Symptoms of  Osteoarthritis joint pain       Current Barriers: The patient is now enrolled with Hospice and Care Management services has been completed. Goal being closed Knowledge Deficits related to how to effectively manage pain and prevent falls with injuries in patient with OA Care Coordination needs related to evaluation of pain in a patient with OA with advanced Lewy body dementia Chronic Disease Management support and education needs related to effective management of OA with patient with memory deficits  Cognitive Deficits  Planned Interventions: Reviewed provider established plan for pain management. The patients daughter states that his pain is out of control. She is also out of pain medications. Refill request sent to the pcp for review. She also says the patient is not sleeping. The patient is having more hallucinations. Recommendations given. Will continue to monitor and follow up accordingly with the patients daughter.  Discussed importance of adherence to all scheduled medical appointments. The patient has had drastic changes over the last several days. Education provided. Have consulted with the pcp for recommendations. Counseled on the importance of reporting any/all new or changed pain symptoms or management strategies to pain management provider. Education and review given; Advised patient to report to care team affect of pain on daily activities; Discussed use of relaxation techniques and/or diversional activities to assist with pain reduction (distraction, imagery, relaxation, massage, acupressure, TENS, heat, and cold application. Did discuss trying heat application to see if this would help the patient. The daughter states they have not tried this.  Reviewed with patient prescribed pharmacological and nonpharmacological pain relief strategies. The patient has paid caregiver during the day and he tries to work  with him on exercises that the PT did with the patient when they were working with him. The patient lots of times will not do this due to the pain level and increased pain level; Advised patient to discuss unresolved pain, changes in level or intensity of pain, and safety and falls prevention with provider; Review of measures to keep the patient safe and preventing falls. The patient is at high risk for falls due to impaired mobility and history or falls. Has worked with PT in the past. The daughter denies any new falls with the patient. The patient is bed bound and has not been getting up. Education provided.  Call made back to the patients daughter today and let her know that the pcp has refilled the patients pain medications and they should be available at the pharmacy. Also discussed palliative services and Hospice. Silvio Pate says they are not ready for that yet and it has been awhile since Florentina Addison has been out with palliative. Recommended her call Florentina Addison and see about getting her to come out to the home for evaluation and support. Will continue to monitor for changes or new needs related to palliative services vs Hospice. The daughter knows how to reach the [facility] if needed.   Symptom Management: Take medications as prescribed   Attend all scheduled provider appointments Call pharmacy for medication refills 3-7 days in advance of running out of medications call the Suicide and Crisis Lifeline: 988 call the [place] National Suicide Prevention Lifeline: 873-245-1969 or  TTY: 1-610-960-4 TTY (217) 345-8756) to talk to a trained counselor call 1-800-273-TALK (toll free, 24 hour hotline) if experiencing a Mental Health or Behavioral Health Crisis   Follow Up Plan: Telephone follow up appointment with care management team member scheduled for: 08-30-2022 at 0900 am       COMPLETED: RNCM Care Management: Expected Outcome:  Monitor, Self-Manage, and Reduce Symptoms of Hypertension       Current Barriers: The  patient is now enrolled with Hospice and Care Management services has been completed. Goal being closed Knowledge Deficits related to importance of normalized blood pressures in the patient with lewy body dementia and HTN Care Coordination needs related to taking blood pressures on a regular basis and calling the office for changes in blood pressures in a patient with HTN and Lewy body dementia Chronic Disease Management support and education needs related to effective management of HTN Cognitive Deficits  BP Readings from Last 3 Encounters:  07/17/22 (!) 179/89  06/21/22 138/64  03/04/22 122/86     Planned Interventions: Evaluation of current treatment plan related to hypertension self management and patient's adherence to plan as established by provider. Blood pressures are elevated more and likely due to chronic pain and discomfort and agitation. The patient is requiring 24/7 care. The patients daughter states that they are just taking things day by day. She said he has had a significant decline over the last several days.  Provided education to patient re: stroke prevention, s/s of heart attack and stroke; Reviewed prescribed diet heart healthy. The patient has not been eating. The patients grandson in law is with him during the day and helps with meeting his needs.  Reviewed medications with patient and discussed importance of compliance. States compliance with medications;  Discussed plans with patient for ongoing care management follow up and provided patient with direct contact information for care management team; Advised patient, providing education and rationale, to monitor blood pressure daily and record, calling PCP for findings outside established parameters;  Reviewed scheduled/upcoming provider appointments including:  Sees pcp on a regular basis. Knows to call for changes.  Advised patient to discuss changes in blood pressures or heart health with provider; Provided education  on prescribed diet heart healthy;  Discussed complications of poorly controlled blood pressure such as heart disease, stroke, circulatory complications, vision complications, kidney impairment, sexual dysfunction;  Screening for signs and symptoms of depression related to chronic disease state;  Assessed social determinant of health barriers;   Symptom Management: Take medications as prescribed   Attend all scheduled provider appointments Call provider office for new concerns or questions  call the Suicide and Crisis Lifeline: 988 call the [place] National Suicide Prevention Lifeline: 872-285-2825 or TTY: 385-372-5735 TTY 670-619-8687) to talk to a trained counselor call 1-800-273-TALK (toll free, 24 hour hotline) if experiencing a Mental Health or Behavioral Health Crisis  check blood pressure 3 times per week write blood pressure results in a log or diary learn about high blood pressure keep a blood pressure log call doctor for signs and symptoms of high blood pressure develop an action plan for high blood pressure keep all doctor appointments take medications for blood pressure exactly as prescribed report new symptoms to your doctor eat more whole grains, fruits and vegetables, lean meats and healthy fats  Follow Up Plan: Telephone follow up appointment with care management team member scheduled for: 08-30-2022  at 0900 am             Please call the care  guide team at [phone] if you need to cancel or reschedule your appointment.   If you are experiencing a Mental Health or Behavioral Health Crisis or need someone to talk to, please call the Suicide and Crisis Lifeline: 988 call the [place] National Suicide Prevention Lifeline: 608-067-2214 or TTY: 503 763 7927 TTY 8383313835) to talk to a trained counselor call 1-800-273-TALK (toll free, 24 hour hotline)   Patient verbalizes understanding of instructions and care plan provided today and agrees to view in MyChart.  Active MyChart status and patient understanding of how to access instructions and care plan via MyChart confirmed with patient.     No further follow up required: the patient is now enrolled in [facility] [employer] Care Management Services closed.   Alto Denver RN, MSN, CCM RN Care Manager  [facility]  Ambulatory Care Management  Direct Number: [phone]

## 2022-10-11 ENCOUNTER — Ambulatory Visit (INDEPENDENT_AMBULATORY_CARE_PROVIDER_SITE_OTHER)

## 2022-10-11 DIAGNOSIS — Z0000 Encounter for general adult medical examination without abnormal findings: Secondary | ICD-10-CM

## 2022-10-11 NOTE — Progress Notes (Signed)
Subjective:   Randy [name] is a 88 y.o. male who presents for Medicare Annual/Subsequent preventive examination.  Visit Complete: Virtual  I connected with  [name] on 10/11/22 by a audio enabled telemedicine application and verified that I am speaking with the correct person using two identifiers. Spoke w/ daughter Randy [name]  Patient Location: Home  Provider Location: Office/Clinic  I discussed the limitations of evaluation and management by telemedicine. The patient expressed understanding and agreed to proceed.  Because this visit was a virtual/telehealth visit, some criteria may be missing or patient reported. Any vitals not documented were not able to be obtained and vitals that have been documented are patient reported.       Objective:    Today's Vitals   10/11/22 0843  PainSc: 4    There is no height or weight on file to calculate BMI.     10/11/2022    8:48 AM 08/29/2022    2:37 PM 02/08/2022    2:50 PM 01/31/2022   10:26 AM 10/11/2021    9:58 AM 10/05/2021    9:00 AM 07/29/2021   11:11 AM  Advanced Directives  Does Patient Have a Medical Advance Directive? Yes Unable to assess, patient is non-responsive or altered mental status No Unable to assess, patient is non-responsive or altered mental status Yes No No  Type of Advance Directive Healthcare Power of [place];Living will        Does patient want to make changes to medical advance directive? No - Patient declined        Copy of Healthcare Power of Attorney in Chart? Yes - validated most recent copy scanned in chart (See row information)        Would patient like information on creating a medical advance directive?   No - Guardian declined;No - Patient declined   No - Patient declined No - Patient declined    Current Medications (verified) Outpatient Encounter Medications as of 10/11/2022  Medication Sig   acetaminophen (TYLENOL) 500 MG tablet Take 1,000 mg by mouth every 8 (eight) hours as needed.    carbidopa-levodopa (SINEMET CR) 50-200 MG tablet Take 1 tablet by mouth 4 (four) times daily.   diclofenac Sodium (VOLTAREN) 1 % GEL APPLY 2 G TOPICALLY 4 (FOUR) TIMES DAILY AS NEEDED. TO KNEE ARTHRITIS PAIN   enalapril (VASOTEC) 5 MG tablet TAKE 1 TABLET (5 MG TOTAL) BY MOUTH DAILY.   furosemide (LASIX) 20 MG tablet Take 1 tablet (20 mg total) by mouth every other day. Patient takes Mondays, Wednesdays, and Fridays.   haloperidol (HALDOL) 5 MG tablet Take 1 tablet (5 mg total) by mouth every 6 (six) hours as needed for agitation. May crush, mix with water and give sublingually if needed.   HYDROcodone-acetaminophen (NORCO) 7.5-325 MG tablet Take 1 tablet by mouth every 6 (six) hours as needed for moderate pain or severe pain.   LORazepam (ATIVAN) 0.5 MG tablet Take 1 tablet (0.5 mg total) by mouth every 6 (six) hours as needed for anxiety. May crush, mix with water and give sublingually if needed.   melatonin 5 MG TABS Take 1 tablet (5 mg total) by mouth at bedtime.   omeprazole (PRILOSEC) 20 MG capsule Take 20 mg by mouth 2 (two) times daily.   ondansetron (ZOFRAN-ODT) 4 MG disintegrating tablet Take 1 tablet (4 mg total) by mouth every 8 (eight) hours as needed for nausea or vomiting.   rivastigmine (EXELON) 3 MG capsule Take 3 mg by mouth 2 (  two) times daily.   sertraline (ZOLOFT) 50 MG tablet Take 50 mg by mouth daily.   traZODone (DESYREL) 50 MG tablet Take 0.5 tablets (25 mg total) by mouth at bedtime as needed for sleep.   No facility-administered encounter medications on file as of 10/11/2022.    Allergies (verified) Antazoline and Antihistamines, chlorpheniramine-type   History: Past Medical History:  Diagnosis Date   Arthritis    GERD (gastroesophageal reflux disease)    Hyperlipidemia    Hypertension    Lewy body dementia (HCC)    Osteoporosis    Prostate enlargement    Past Surgical History:  Procedure Laterality Date   CHOLECYSTECTOMY N/A 09/10/2018   Procedure:  LAPAROSCOPIC CHOLECYSTECTOMY;  Surgeon: Sung Amabile, DO;  Location: [facility] ORS;  Service: General;  Laterality: N/A;   CYSTOSCOPY WITH LITHOLAPAXY N/A 10/20/2020   Procedure: CYSTOSCOPY WITH LITHOLAPAXY;  Surgeon: Sondra Come, MD;  Location: [facility] ORS;  Service: Urology;  Laterality: N/A;   ESOPHAGOGASTRODUODENOSCOPY (EGD) WITH PROPOFOL N/A 10/11/2021   Procedure: ESOPHAGOGASTRODUODENOSCOPY (EGD) WITH PROPOFOL;  Surgeon: Toney Reil, MD;  Location: [facility] ENDOSCOPY;  Service: Gastroenterology;  Laterality: N/A;   GANGLION CYST EXCISION Left 2013   Left upper arm   HERNIA REPAIR N/A 2008   [place]line, ventral acquired hernia repair   HOLEP-LASER ENUCLEATION OF THE PROSTATE WITH MORCELLATION N/A 11/06/2018   Procedure: HOLEP-LASER ENUCLEATION OF THE PROSTATE WITH MORCELLATION;  Surgeon: Sondra Come, MD;  Location: [facility] ORS;  Service: Urology;  Laterality: N/A;   HOLEP-LASER ENUCLEATION OF THE PROSTATE WITH MORCELLATION N/A 10/20/2020   Procedure: HOLEP-LASER ENUCLEATION OF THE PROSTATE WITH MORCELLATION;  Surgeon: Sondra Come, MD;  Location: [facility] ORS;  Service: Urology;  Laterality: N/A;   Family History  Problem Relation Age of Onset   Breast cancer Mother    Lung cancer Father    Heart disease Brother    Leukemia Brother    Prostate cancer Neg Hx    Colon cancer Neg Hx    Bladder Cancer Neg Hx    Kidney cancer Neg Hx    Social History   Socioeconomic History   Marital status: Married    Spouse name: Not on file   Number of children: Not on file   Years of education: College   Highest education level: Not on file  Occupational History   Occupation: Retired Personnel officer  Tobacco Use   Smoking status: Former    Current packs/day: 0.00    Average packs/day: 1 pack/day for 12.0 years (12.0 ttl pk-yrs)    Types: Cigarettes    Start date: 68    Quit date: 1968    Years since quitting: 56.7   Smokeless tobacco: Never  Vaping Use   Vaping status: Never Used   Substance and Sexual Activity   Alcohol use: No   Drug use: No   Sexual activity: Not Currently  Other Topics Concern   Not on file  Social History Narrative   Not on file   Social Determinants of Health   Financial Resource Strain: Low Risk  (10/11/2022)   Overall Financial Resource Strain (CARDIA)    Difficulty of Paying Living Expenses: Not hard at all  Food Insecurity: No Food Insecurity (10/11/2022)   Hunger Vital Sign    Worried About Running Out of Food in the Last Year: Never true    Ran Out of Food in the Last Year: Never true  Transportation Needs: No Transportation Needs (10/11/2022)   PRAPARE - Transportation  Lack of Transportation (Medical): No    Lack of Transportation (Non-Medical): No  Physical Activity: Inactive (10/11/2022)   Exercise Vital Sign    Days of Exercise per Week: 0 days    Minutes of Exercise per Session: 0 min  Stress: No Stress Concern Present (10/11/2022)   [employer] of Occupational Health - Occupational Stress Questionnaire    Feeling of Stress : Not at all  Social Connections: Moderately Isolated (10/11/2022)   Social Connection and Isolation Panel [NHANES]    Frequency of Communication with Friends and Family: Once a week    Frequency of Social Gatherings with Friends and Family: Twice a week    Attends Religious Services: Never    Database administrator or Organizations: No    Attends Engineer, structural: Never    Marital Status: Married    Tobacco Counseling Counseling given: Not Answered   Clinical Intake:  Pre-visit preparation completed: Yes  Pain : 0-10 Pain Score: 4  Pain Type: Chronic pain Pain Location: Knee Pain Orientation: Right, Left Pain Descriptors / Indicators: Aching Pain Onset: More than a month ago Pain Frequency: Constant     Nutritional Status: BMI 25 -29 Overweight Nutritional Risks: None Diabetes: No  How often do you need to have someone help you when you read instructions,  pamphlets, or other written materials from your doctor or pharmacy?: 1 - Never  Interpreter Needed?: No  Information entered by :: Kennedy Bucker, LPN   Activities of Daily Living    10/11/2022    8:49 AM 08/30/2022   12:10 PM  In your present state of health, do you have any difficulty performing the following activities:  Hearing? 1 0  Vision? 0 0  Difficulty concentrating or making decisions? 1 1  Walking or climbing stairs? 1 1  Comment w/c bound   Dressing or bathing? 1 1  Doing errands, shopping? 1 0  Preparing Food and eating ? Y   Using the Toilet? Y   In the past six months, have you accidently leaked urine? N   Do you have problems with loss of bowel control? N   Managing your Medications? Y   Managing your Finances? Y   Housekeeping or managing your Housekeeping? Y     Patient Care Team: [name], DO as PCP - General (Family Medicine) Debbe Odea, MD as PCP - Cardiology (Cardiology) Eliezer Lofts, NP (Inactive) as Nurse Practitioner (Hospice and Palliative Medicine) Lonell Face, MD as Consulting Physician (Neurology) Janice Coffin, PA-C (Physician Assistant)  Indicate any recent Medical Services you may have received from other than [facility] providers in the past year (date may be approximate).     Assessment:   This is a routine wellness examination for Ramiro.  Hearing/Vision screen Hearing Screening - Comments:: No aids Vision Screening - Comments:: Wears glasses- Dr.Woodard   Goals Addressed             This Visit's Progress    DIET - REDUCE SUGAR INTAKE         Depression Screen    10/11/2022    8:46 AM 10/05/2021    8:59 AM 07/25/2020    2:46 PM 03/02/2019    1:37 PM 01/22/2019    3:26 PM 09/21/2018    1:24 PM 09/16/2018    2:33 PM  PHQ 2/9 Scores  PHQ - 2 Score 1 0 0 0 0 0 0  PHQ- 9 Score 1 0  Fall Risk    10/11/2022    8:49 AM 11/05/2021    9:13 AM 10/15/2021    9:21 AM 10/05/2021    9:01 AM  12/05/2020    8:50 AM  Fall Risk   Falls in the past year? 1 1  1 1   Number falls in past yr: 1 1  1  0  Injury with Fall? 0 1  1 0  Risk for fall due to : History of fall(s) History of fall(s);Impaired balance/gait;Impaired mobility;Other (Comment) History of fall(s) History of fall(s) History of fall(s)  Risk for fall due to: Comment  lewy body dementia     Follow up Falls prevention discussed;Falls evaluation completed Falls evaluation completed;Falls prevention discussed;Education provided Falls evaluation completed;Falls prevention discussed Falls evaluation completed;Falls prevention discussed Falls evaluation completed    MEDICARE RISK AT HOME:    TIMED UP AND GO:  Was the test performed?  No    Cognitive Function:pt unavailable         02/24/2018    1:46 PM 11/26/2016    2:29 PM  6CIT Screen  What Year? 0 points 0 points  What month? 0 points 0 points  What time? 0 points 0 points  Count back from 20 0 points 0 points  Months in reverse 0 points 2 points  Repeat phrase 2 points 6 points  Total Score 2 points 8 points    Immunizations Immunization History  Administered Date(s) Administered   Fluad Quad(high Dose 65+) 11/07/2018, 11/10/2019, 12/21/2020, 10/19/2021   Influenza, High Dose Seasonal PF 10/01/2016, 10/07/2017   Pneumococcal Conjugate-13 09/11/2016   Pneumococcal Polysaccharide-23 10/31/2017   Tdap 03/05/2016    TDAP status: Up to date  Flu Vaccine status: Due, Education has been provided regarding the importance of this vaccine. Advised may receive this vaccine at local pharmacy or Health Dept. Aware to provide a copy of the vaccination record if obtained from local pharmacy or Health Dept. Verbalized acceptance and understanding.  Pneumococcal vaccine status: Up to date  Covid-19 vaccine status: Declined, Education has been provided regarding the importance of this vaccine but patient still declined. Advised may receive this vaccine at local  pharmacy or Health Dept.or vaccine clinic. Aware to provide a copy of the vaccination record if obtained from local pharmacy or Health Dept. Verbalized acceptance and understanding.  Qualifies for Shingles Vaccine? Yes   Zostavax completed No   Shingrix Completed?: No.    Education has been provided regarding the importance of this vaccine. Patient has been advised to call insurance company to determine out of pocket expense if they have not yet received this vaccine. Advised may also receive vaccine at local pharmacy or Health Dept. Verbalized acceptance and understanding.  Screening Tests Health Maintenance  Topic Date Due   COVID-19 Vaccine (1) Never done   Zoster Vaccines- Shingrix (1 of 2) Never done   INFLUENZA VACCINE  08/08/2022   DTaP/Tdap/Td (2 - Td or Tdap) 03/05/2026   Pneumonia Vaccine 67+ Years old  Completed   HPV VACCINES  Aged Out    Health Maintenance  Health Maintenance Due  Topic Date Due   COVID-19 Vaccine (1) Never done   Zoster Vaccines- Shingrix (1 of 2) Never done   INFLUENZA VACCINE  08/08/2022    Colorectal cancer screening: No longer required.   Lung Cancer Screening: (Low Dose CT Chest recommended if Age 16-80 years, 20 pack-year currently smoking OR have quit w/in 15years.) does not qualify.     Additional Screening:  Hepatitis  C Screening: does not qualify; Completed no  Vision Screening: Recommended annual ophthalmology exams for early detection of glaucoma and other disorders of the eye. Is the patient up to date with their annual eye exam?  Yes  Who is the provider or what is the name of the office in which the patient attends annual eye exams? Dr.Woodard If pt is not established with a provider, would they like to be referred to a provider to establish care? No .   Dental Screening: Recommended annual dental exams for proper oral hygiene    Community Resource Referral / Chronic Care Management: CRR required this visit?  No   CCM  required this visit?  No     Plan:     I have personally reviewed and noted the following in the patient's chart:   Medical and social history Use of alcohol, tobacco or illicit drugs  Current medications and supplements including opioid prescriptions. Patient is not currently taking opioid prescriptions. Functional ability and status Nutritional status Physical activity Advanced directives List of other physicians Hospitalizations, surgeries, and ER visits in previous 12 months Vitals Screenings to include cognitive, depression, and falls Referrals and appointments  In addition, I have reviewed and discussed with patient certain preventive protocols, quality metrics, and best practice recommendations. A written personalized care plan for preventive services as well as general preventive health recommendations were provided to patient.     Hal Hope, LPN   16/01/958   After Visit Summary: (MyChart) Due to this being a telephonic visit, the after visit summary with patients personalized plan was offered to patient via MyChart   Nurse Notes: none

## 2022-10-16 ENCOUNTER — Emergency Department
Admission: EM | Admit: 2022-10-16 | Discharge: 2022-10-16 | Disposition: A | Discharge status: Home / Self Care | Attending: Emergency Medicine | Admitting: Emergency Medicine

## 2022-10-16 ENCOUNTER — Emergency Department

## 2022-10-16 ENCOUNTER — Other Ambulatory Visit: Payer: Self-pay

## 2022-10-16 DIAGNOSIS — G20C Parkinsonism, unspecified: Secondary | ICD-10-CM | POA: Diagnosis not present

## 2022-10-16 DIAGNOSIS — R109 Unspecified abdominal pain: Secondary | ICD-10-CM | POA: Diagnosis not present

## 2022-10-16 DIAGNOSIS — R82998 Other abnormal findings in urine: Secondary | ICD-10-CM | POA: Insufficient documentation

## 2022-10-16 DIAGNOSIS — F0390 Unspecified dementia without behavioral disturbance: Secondary | ICD-10-CM | POA: Diagnosis not present

## 2022-10-16 DIAGNOSIS — R339 Retention of urine, unspecified: Secondary | ICD-10-CM | POA: Diagnosis present

## 2022-10-16 LAB — CBC
HCT: 44.5 % (ref 39.0–52.0)
Hemoglobin: 14.2 g/dL (ref 13.0–17.0)
MCH: 29.5 pg (ref 26.0–34.0)
MCHC: 31.9 g/dL (ref 30.0–36.0)
MCV: 92.5 fL (ref 80.0–100.0)
Platelets: 241 10*3/uL (ref 150–400)
RBC: 4.81 MIL/uL (ref 4.22–5.81)
RDW: 13.3 % (ref 11.5–15.5)
WBC: 8.1 10*3/uL (ref 4.0–10.5)
nRBC: 0 % (ref 0.0–0.2)

## 2022-10-16 LAB — COMPREHENSIVE METABOLIC PANEL
ALT: 5 U/L (ref 0–44)
AST: 19 U/L (ref 15–41)
Albumin: 3.5 g/dL (ref 3.5–5.0)
Alkaline Phosphatase: 136 U/L — ABNORMAL HIGH (ref 38–126)
Anion gap: 10 (ref 5–15)
BUN: 32 mg/dL — ABNORMAL HIGH (ref 8–23)
CO2: 27 mmol/L (ref 22–32)
Calcium: 8.4 mg/dL — ABNORMAL LOW (ref 8.9–10.3)
Chloride: 100 mmol/L (ref 98–111)
Creatinine, Ser: 0.91 mg/dL (ref 0.61–1.24)
GFR, Estimated: >60 mL/min (ref >60)
Glucose, Bld: 97 mg/dL (ref 70–99)
Potassium: 3.5 mmol/L (ref 3.5–5.1)
Sodium: 137 mmol/L (ref 135–145)
Total Bilirubin: 0.9 mg/dL (ref 0.3–1.2)
Total Protein: 7.2 g/dL (ref 6.5–8.1)

## 2022-10-16 LAB — URINALYSIS, ROUTINE W REFLEX MICROSCOPIC
Bilirubin Urine: NEGATIVE
Glucose, UA: NEGATIVE mg/dL
Hgb urine dipstick: NEGATIVE
Ketones, ur: 5 mg/dL — AB
Leukocytes,Ua: NEGATIVE
Nitrite: NEGATIVE
Protein, ur: NEGATIVE mg/dL
Specific Gravity, Urine: 1.027 (ref 1.005–1.030)
pH: 5 (ref 5.0–8.0)

## 2022-10-16 LAB — LIPASE, BLOOD: Lipase: 38 U/L (ref 11–51)

## 2022-10-16 NOTE — ED Provider Notes (Signed)
[facility] Provider Note    Event Date/Time   First MD Initiated Contact with Patient 10/16/22 1228     (approximate)   History   Hematuria   HPI {Remember to add pertinent medical, surgical, social, and/or OB history to HPI:1} Randy [name] is a 88 y.o. male history of Parkinson's disease and Lewy body dementia  Also noted is a history of BPH without obstruction.  Degenerative joint disease.  Generalized weakness.  Prostate cancer, sepsis   Review of July urology notes demonstrate a history of previous Foley dependence with urinary retention.  History of Enterococcus UTI  Physical Exam   Triage Vital Signs: ED Triage Vitals [10/16/22 1205]  Encounter Vitals Group     BP 138/84     Systolic BP Percentile      Diastolic BP Percentile      Pulse Rate 88     Resp 20     Temp 97.9 F (36.6 C)     Temp Source Oral     SpO2 97 %     Weight      Height      Head Circumference      Peak Flow      Pain Score 0     Pain Loc      Pain Education      Exclude from Growth Chart     Most recent vital signs: Vitals:   10/16/22 1205 10/16/22 1300  BP: 138/84 137/78  Pulse: 88 67  Resp: 20 16  Temp: 97.9 F (36.6 C)   SpO2: 97% 92%    {Only need to document appropriate and relevant physical exam:1} General: Awake, no distress. *** CV:  Good peripheral perfusion. *** Resp:  Normal effort. *** Abd:  No distention. *** Other:  ***   ED Results / Procedures / Treatments   Labs (all labs ordered are listed, but only abnormal results are displayed) Labs Reviewed  COMPREHENSIVE METABOLIC PANEL - Abnormal; Notable for the following components:      Result Value   BUN 32 (*)    Calcium 8.4 (*)    Alkaline Phosphatase 136 (*)    All other components within normal limits  LIPASE, BLOOD  CBC  URINALYSIS, ROUTINE W REFLEX MICROSCOPIC     EKG  ***   RADIOLOGY *** {USE THE WORD "INTERPRETED"!! You MUST document your own interpretation  of imaging, as well as the fact that you reviewed the radiologist's report!:1}   PROCEDURES:  Critical Care performed: {CriticalCareYesNo:19197::"Yes, see critical care procedure note(s)","No"}  Procedures   MEDICATIONS ORDERED IN ED: Medications - No data to display   IMPRESSION / MDM / ASSESSMENT AND PLAN / ED COURSE  I reviewed the triage vital signs and the nursing notes.                              Differential diagnosis includes, but is not limited to, ***  Patient's presentation is most consistent with {EM COPA:27473}  *** {If the patient is on the monitor, remove the brackets and asterisks on the sentence below and remember to document it as a Procedure as well. Otherwise delete the sentence below:1} {**The patient is on the cardiac monitor to evaluate for evidence of arrhythmia and/or significant heart rate changes.**} {Remember to include, when applicable, any/all of the following data: independent review of imaging independent review of labs (comment specifically on pertinent positives and negatives) review  of specific prior hospitalizations, PCP/specialist notes, etc. discuss meds given and prescribed document any discussion with consultants (including hospitalists) any clinical decision tools you used and why (PECARN, NEXUS, etc.) did you consider admitting the patient? document social determinants of health affecting patient's care (homelessness, inability to follow up in a timely fashion, etc) document any pre-existing conditions increasing risk on current visit (e.g. diabetes and HTN increasing danger of high-risk chest pain/ACS) describes what meds you gave (especially parenteral) and why any other interventions?:1}     FINAL CLINICAL IMPRESSION(S) / ED DIAGNOSES   Final diagnoses:  None     Rx / DC Orders   ED Discharge Orders     None        Note:  This document was prepared using Dragon voice recognition software and may include  unintentional dictation errors.

## 2022-10-16 NOTE — Discharge Instructions (Signed)
The dark urine is most likely due to dehydration although there is possibility that there could have been intermittent blood in the urine.  Right now, the urinalysis and CT do not show any concerning findings.  Follow-up with Dr. Richardo Hanks.  Make sure to drink plenty of fluids over the next several days.  Return to the ER for new, worsening, or persistent severe urinary symptoms including dark or bloody urine, inability to urinate, abdominal or flank pain, fever, or any other new or worsening symptoms that concern you.

## 2022-10-16 NOTE — ED Notes (Signed)
Pt NAD, daughter at bedside, urine sent, pt cooperative and pleasant and tolerating fluids and is eating at this time

## 2022-10-16 NOTE — ED Notes (Signed)
Pt bladder scanned for of urine.

## 2022-10-16 NOTE — ED Provider Notes (Signed)
-----------------------------------------   5:19 PM on 10/16/2022 -----------------------------------------  I took over care of this patient from Dr. Fanny Bien.  The CT is negative for ureteral stone or other acute findings.  IMPRESSION:  Coronary artery, aortic atherosclerosis.    Trace right pleural effusion, small hiatal hernia.    Stable simple and complex cysts throughout the kidneys. These could  be further characterized with elective nonemergent MRI if felt  clinically indicated.    Aortic atherosclerosis.    No acute findings.    The patient is now urinating, and was able to provide a sample.  This is also negative for blood or other abnormal findings.  On reassessment, the patient feels well and would like to go home.  Given the negative workup, he is stable for discharge home.  I counseled him on the results of the workup and plan of care.  I recommend he follow-up with his urologist Dr. Richardo Hanks.  I gave strict return precautions and he and his family member expressed understanding.   Dionne Bucy, MD 10/16/22 [phone]

## 2022-10-16 NOTE — ED Triage Notes (Signed)
Pt to ED via [facility] from home. Caretaker called due to hematuria. Caretaker pt last urinated at 9pm on 10/8. Caretaker also reports emesis x1. Oriented x1 at baseline. CBG 160.

## 2022-10-16 NOTE — Progress Notes (Signed)
[facility] Liaison note:   This patient is a current [facility] patient. [facility] will continue to follow for discharge disposition.    Please call for any Hospice  related questions or concerns.   [facility] Liaison [phone]

## 2022-10-30 ENCOUNTER — Telehealth: Payer: Self-pay

## 2022-10-30 ENCOUNTER — Other Ambulatory Visit: Payer: Self-pay | Admitting: Family Medicine

## 2022-10-30 DIAGNOSIS — G8929 Other chronic pain: Secondary | ICD-10-CM

## 2022-10-30 DIAGNOSIS — M150 Primary generalized (osteo)arthritis: Secondary | ICD-10-CM

## 2022-10-30 DIAGNOSIS — Z515 Encounter for palliative care: Secondary | ICD-10-CM

## 2022-10-30 DIAGNOSIS — G3183 Dementia with Lewy bodies: Secondary | ICD-10-CM

## 2022-10-30 NOTE — Telephone Encounter (Signed)
Medication Refill - Medication: HYDROcodone-acetaminophen (NORCO) 7.5-325 MG tablet [[id number]]  Has the patient contacted their pharmacy? No   [facility] [place], [place] - [address]  [address] [place] [address]  Phone:  [phone]  Fax:  [phone]  DEA #:  --  DAW Reason: --      Has the patient been seen for an appointment in the last year OR does the patient have an upcoming appointment? Yes.    Agent: Please be advised that RX refills may take up to 3 business days. We ask that you follow-up with your pharmacy.

## 2022-10-30 NOTE — Telephone Encounter (Signed)
Letter has been written. It is available on his mychart and I have printed a copy and signed it. It will be in my outbox for pick up.  Saralyn Pilar, DO [facility] 10/30/2022, 5:38 PM

## 2022-10-30 NOTE — Telephone Encounter (Signed)
Copied from CRM [id number]. Topic: General - Other >> Oct 30, 2022  1:38 PM Phill Myron wrote: Daughter needs a letter stating patients mental state and if he is or is not able to care for himself.

## 2022-11-01 MED ORDER — HYDROCODONE-ACETAMINOPHEN 7.5-325 MG PO TABS
1.0000 | ORAL_TABLET | Freq: Four times a day (QID) | ORAL | 0 refills | Status: DC | PRN
Start: 2022-11-01 — End: 2022-12-09

## 2022-11-01 NOTE — Telephone Encounter (Signed)
Requested medications are due for refill today.  yes  Requested medications are on the active medications list.  yes  Last refill. 09/16/2022 #120 0 rf  Future visit scheduled.   no  Notes to clinic.  Refill not delegated.    Requested Prescriptions  Pending Prescriptions Disp Refills   HYDROcodone-acetaminophen (NORCO) 7.5-325 MG tablet 120 tablet 0    Sig: Take 1 tablet by mouth every 6 (six) hours as needed for moderate pain (pain score 4-6) or severe pain (pain score 7-10).     Not Delegated - Analgesics:  Opioid Agonist Combinations Failed - 10/30/2022  3:06 PM      Failed - This refill cannot be delegated      Failed - Urine Drug Screen completed in last 360 days      Passed - Valid encounter within last 3 months    Recent Outpatient Visits           8 months ago Lewy body dementia without behavioral disturbance [facility])   [place] [facility] [place], Netta Neat, DO   12 months ago Acute maxillary sinusitis, recurrence not specified   [facility] [place], Oswaldo [place], [place]   1 year ago Chronic pain of left knee   [place] [facility] [name], DO   1 year ago Nausea and vomiting, unspecified vomiting type   [place] [facility] [place], Salvadore [place], NP   1 year ago Acute non-recurrent frontal sinusitis   [place] [facility] [name], DO       Future Appointments             In 8 months Richardo Hanks, Laurette Schimke, MD [facility] [place]

## 2022-11-15 ENCOUNTER — Telehealth: Payer: Self-pay

## 2022-11-15 NOTE — Telephone Encounter (Signed)
Transition Care Management Unsuccessful Follow-up Telephone Call  Date of discharge and from where:  [facility] 10/9  Attempts:  1st Attempt  Reason for unsuccessful TCM follow-up call:  No answer/busy   [facility]  [facility] [employer] Guide, Phone: [phone] Website: [email or web address]

## 2022-11-18 ENCOUNTER — Telehealth: Payer: Self-pay

## 2022-11-18 NOTE — Telephone Encounter (Signed)
Transition Care Management Unsuccessful Follow-up Telephone Call  Date of discharge and from where:  [place] 10/9  Attempts:  2nd Attempt  Reason for unsuccessful TCM follow-up call:  No answer/busy   [name]  [facility] Guide, Phone: [phone] Website: [email or web address]

## 2022-12-09 ENCOUNTER — Other Ambulatory Visit: Payer: Self-pay | Admitting: Family Medicine

## 2022-12-09 DIAGNOSIS — M150 Primary generalized (osteo)arthritis: Secondary | ICD-10-CM

## 2022-12-09 DIAGNOSIS — G8929 Other chronic pain: Secondary | ICD-10-CM

## 2022-12-09 DIAGNOSIS — Z515 Encounter for palliative care: Secondary | ICD-10-CM

## 2022-12-09 MED ORDER — HYDROCODONE-ACETAMINOPHEN 7.5-325 MG PO TABS
1.0000 | ORAL_TABLET | Freq: Four times a day (QID) | ORAL | 0 refills | Status: DC | PRN
Start: 2022-12-09 — End: 2023-01-20

## 2022-12-20 ENCOUNTER — Ambulatory Visit: Payer: Self-pay

## 2022-12-20 DIAGNOSIS — Z515 Encounter for palliative care: Secondary | ICD-10-CM

## 2022-12-20 DIAGNOSIS — F5101 Primary insomnia: Secondary | ICD-10-CM

## 2022-12-20 MED ORDER — MELATONIN 10 MG PO TABS
10.0000 mg | ORAL_TABLET | Freq: Every day | ORAL | 12 refills | Status: DC
Start: 2022-12-20 — End: 2023-04-29

## 2022-12-20 NOTE — Addendum Note (Signed)
Addended by: [name] on: 12/20/2022 05:06 PM   Modules accepted: Orders

## 2022-12-20 NOTE — Telephone Encounter (Signed)
  Chief Complaint: Needs medication increase Symptoms: sleep Frequency:  Pertinent Negatives: Patient denies  Disposition: [] ED /[] Urgent Care (no appt availability in office) / [] Appointment(In office/virtual)/ []   Virtual Care/ [] Home Care/ [] Refused Recommended Disposition /[]  Mobile Bus/ [x]  Follow-up with PCP Additional Notes: Returned call to Bernie. She was with pt earlier today. Pt's son said the 5 mg of melatonin was not working so they have been giving him 10mg . 10 mg is working well. Pt is given med at 8pm and he is asleep by 9:30 and sleeps until the morning.  Juliette Alcide is requesting a dosage change. Please advise.     Summary: melatonin 5 MG TABS ? Increase dosage?   Home Health nurse Juliette Alcide called because the patients family is asking to speak with the provider about the medication prescribed, melatonin 5 MG TABS. They are requesting for his dosage to be increased. Please assist further     Reason for Disposition  [1] Caller has medicine question about med NOT prescribed by PCP AND [2] triager unable to answer question (e.g., compatibility with other med, storage)  Answer Assessment - Initial Assessment Questions 1. NAME of MEDICINE: "What medicine(s) are you calling about?"     Melatonin 2. QUESTION: "What is your question?" (e.g., double dose of medicine, side effect)     Need order for increased dosage 3. PRESCRIBER: "Who prescribed the medicine?" Reason: if prescribed by specialist, call should be referred to that group.     Dr. Nelson Chimes  Protocols used: Medication Question Call-A-AH

## 2022-12-20 NOTE — Telephone Encounter (Signed)
Called [place] back. Agree to dose inc Melatonin 5 to 10mg . New rx sent to [facility]  Saralyn Pilar, DO [facility] 12/20/2022, 5:06 PM

## 2023-01-20 ENCOUNTER — Other Ambulatory Visit: Payer: Self-pay | Admitting: Family Medicine

## 2023-01-20 DIAGNOSIS — Z515 Encounter for palliative care: Secondary | ICD-10-CM

## 2023-01-20 DIAGNOSIS — M150 Primary generalized (osteo)arthritis: Secondary | ICD-10-CM

## 2023-01-20 DIAGNOSIS — G8929 Other chronic pain: Secondary | ICD-10-CM

## 2023-01-20 NOTE — Telephone Encounter (Signed)
 Copied from CRM [id number]. Topic: General - Other >> Jan 20, 2023  9:57 AM Everette C wrote: Reason for CRM: Medication Refill - Most Recent Primary Care Visit:  Provider: GWENN JHONNIE RAMAN Department: [facility] Visit Type: MEDICARE AWV, SEQUENTIAL Date: 10/11/2022  Medication: HYDROcodone -acetaminophen  (NORCO) 7.5-325 MG tablet [[id number]]  Has the patient contacted their pharmacy? Yes (Agent: If no, request that the patient contact the pharmacy for the refill. If patient does not wish to contact the pharmacy document the reason why and proceed with request.) (Agent: If yes, when and what did the pharmacy advise?)  Is this the correct pharmacy for this prescription? Yes If no, delete pharmacy and type the correct one.  This is the patient's preferred pharmacy:  [facility] - [place], [place] - [address] [place] [address] Phone: [phone] Fax: [phone]   Has the prescription been filled recently? Yes  Is the patient out of the medication? No  Has the patient been seen for an appointment in the last year OR does the patient have an upcoming appointment? Yes  Can we respond through MyChart? No  Agent: Please be advised that Rx refills may take up to 3 business days. We ask that you follow-up with your pharmacy.

## 2023-01-21 MED ORDER — HYDROCODONE-ACETAMINOPHEN 7.5-325 MG PO TABS
1.0000 | ORAL_TABLET | Freq: Four times a day (QID) | ORAL | 0 refills | Status: DC | PRN
Start: 2023-01-21 — End: 2023-04-29

## 2023-01-21 NOTE — Telephone Encounter (Signed)
 Requested medication (s) are due for refill today: Yes  Requested medication (s) are on the active medication list: Yes  Last refill:  12/09/22 #120  Future visit scheduled: No  Notes to clinic:  Unable to refill per protocol, cannot delegate.      Requested Prescriptions  Pending Prescriptions Disp Refills   HYDROcodone -acetaminophen  (NORCO) 7.5-325 MG tablet 120 tablet 0    Sig: Take 1 tablet by mouth every 6 (six) hours as needed for moderate pain (pain score 4-6) or severe pain (pain score 7-10).     Not Delegated - Analgesics:  Opioid Agonist Combinations Failed - 01/21/2023  2:12 PM      Failed - This refill cannot be delegated      Failed - Urine Drug Screen completed in last 360 days      Failed - Valid encounter within last 3 months    Recent Outpatient Visits           10 months ago Lewy body dementia without behavioral disturbance [facility])   [place] [facility] [place], Marsa PARAS, DO   1 year ago Acute maxillary sinusitis, recurrence not specified   [place] [facility] M[place], Rocky BRAVO, [place]   1 year ago Chronic pain of left knee   [place] [facility] [place], Marsa PARAS, DO   2 years ago Nausea and vomiting, unspecified vomiting type   [place] [facility] [place], Angeline ORN, NP   2 years ago Acute non-recurrent frontal sinusitis    [place] [facility] [place] Marsa PARAS, DO       Future Appointments             In 6 months Franc[place]a, Redell BROCKS, MD [facility] [place]

## 2023-01-22 ENCOUNTER — Telehealth: Payer: Self-pay

## 2023-01-22 NOTE — Progress Notes (Addendum)
PA started   [employer] (Key: [id number]) Rx #: [id number] Need Help? Call us at [phone] Status New (Not sent to plan) Drug HYDROcodone-Acetaminophen 7.5-325MG  tablets ePA cloud logo Form [employer] Electronic PA Form [phone] NCPDP) Original Claim Info [id number] 01THIS PRODUCT MAY BE COVERED UNDER HOSPIC 02E - MEDICARE A 03H   PA was not started. Called to see if patient was still on this medication. No call back from family. So this has been archived for now.

## 2023-01-27 NOTE — Telephone Encounter (Signed)
Hydocodone authorization has been archived for now. Family never return call about rather patient was on the medication

## 2023-02-01 ENCOUNTER — Other Ambulatory Visit: Payer: Self-pay | Admitting: Family Medicine

## 2023-02-01 DIAGNOSIS — I10 Essential (primary) hypertension: Secondary | ICD-10-CM

## 2023-02-03 NOTE — Telephone Encounter (Signed)
Requested Prescriptions  Pending Prescriptions Disp Refills   enalapril (VASOTEC) 5 MG tablet [Pharmacy Med Name: ENALAPRIL MALEATE 5 MG TAB] 90 tablet 0    Sig: TAKE 1 TABLET BY MOUTH ONCE DAILY     Cardiovascular:  ACE Inhibitors Failed - 02/03/2023  1:17 PM      Failed - Last BP in normal range    BP Readings from Last 1 Encounters:  10/16/22 (!) 135/93         Passed - Cr in normal range and within 180 days    Creat  Date Value Ref Range Status  04/02/2019 1.10 0.70 - 1.11 mg/dL Final    Comment:    For patients >68 years of age, the reference limit for Creatinine is approximately 13% higher for people identified as African-American. .    Creatinine, Ser  Date Value Ref Range Status  10/16/2022 0.91 0.61 - 1.24 mg/dL Final         Passed - K in normal range and within 180 days    Potassium  Date Value Ref Range Status  10/16/2022 3.5 3.5 - 5.1 mmol/L Final  04/17/2015 4.0 mmol/L Final         Passed - Patient is not pregnant      Passed - Valid encounter within last 6 months    Recent Outpatient Visits           11 months ago Lewy body dementia without behavioral disturbance [facility])   [place] [facility] [place], Netta Neat, DO   1 year ago Acute maxillary sinusitis, recurrence not specified   [place] [facility], Oswaldo Conroy, [place]   1 year ago Chronic pain of left knee   [place] [facility] [place], Netta Neat, DO   2 years ago Nausea and vomiting, unspecified vomiting type   [place] [facility] [place], Salvadore Oxford, NP   2 years ago Acute non-recurrent frontal sinusitis   [place] [facility] [place] [name], DO       Future Appointments             In 5 months Richardo Hanks, Laurette Schimke, MD [facility] [place]

## 2023-04-08 DEATH — deceased

## 2023-07-22 ENCOUNTER — Ambulatory Visit: Payer: Self-pay | Admitting: Urology
